# Patient Record
Sex: Male | Born: 1935
Health system: Southern US, Community
[De-identification: ages and names within clinical notes are randomized; demographics above are authoritative.]

## PROBLEM LIST (undated history)

## (undated) DIAGNOSIS — E785 Hyperlipidemia, unspecified: Secondary | ICD-10-CM

## (undated) DIAGNOSIS — T4145XA Adverse effect of unspecified anesthetic, initial encounter: Secondary | ICD-10-CM

## (undated) DIAGNOSIS — C61 Malignant neoplasm of prostate: Secondary | ICD-10-CM

## (undated) DIAGNOSIS — I10 Essential (primary) hypertension: Secondary | ICD-10-CM

## (undated) DIAGNOSIS — M419 Scoliosis, unspecified: Secondary | ICD-10-CM

## (undated) DIAGNOSIS — M1990 Unspecified osteoarthritis, unspecified site: Secondary | ICD-10-CM

## (undated) DIAGNOSIS — L209 Atopic dermatitis, unspecified: Secondary | ICD-10-CM

## (undated) DIAGNOSIS — N529 Male erectile dysfunction, unspecified: Secondary | ICD-10-CM

## (undated) DIAGNOSIS — N281 Cyst of kidney, acquired: Secondary | ICD-10-CM

## (undated) DIAGNOSIS — T8859XA Other complications of anesthesia, initial encounter: Secondary | ICD-10-CM

## (undated) DIAGNOSIS — Z923 Personal history of irradiation: Secondary | ICD-10-CM

## (undated) DIAGNOSIS — I639 Cerebral infarction, unspecified: Secondary | ICD-10-CM

## (undated) DIAGNOSIS — S82899A Other fracture of unspecified lower leg, initial encounter for closed fracture: Secondary | ICD-10-CM

## (undated) DIAGNOSIS — J410 Simple chronic bronchitis: Secondary | ICD-10-CM

## (undated) DIAGNOSIS — I714 Abdominal aortic aneurysm, without rupture, unspecified: Secondary | ICD-10-CM

## (undated) DIAGNOSIS — K4090 Unilateral inguinal hernia, without obstruction or gangrene, not specified as recurrent: Secondary | ICD-10-CM

## (undated) DIAGNOSIS — D649 Anemia, unspecified: Secondary | ICD-10-CM

## (undated) DIAGNOSIS — K5790 Diverticulosis of intestine, part unspecified, without perforation or abscess without bleeding: Secondary | ICD-10-CM

## (undated) DIAGNOSIS — Z77090 Contact with and (suspected) exposure to asbestos: Secondary | ICD-10-CM

## (undated) DIAGNOSIS — H409 Unspecified glaucoma: Secondary | ICD-10-CM

## (undated) DIAGNOSIS — I351 Nonrheumatic aortic (valve) insufficiency: Secondary | ICD-10-CM

## (undated) DIAGNOSIS — R351 Nocturia: Secondary | ICD-10-CM

## (undated) DIAGNOSIS — IMO0002 Reserved for concepts with insufficient information to code with codable children: Secondary | ICD-10-CM

## (undated) DIAGNOSIS — M169 Osteoarthritis of hip, unspecified: Secondary | ICD-10-CM

## (undated) DIAGNOSIS — Z8619 Personal history of other infectious and parasitic diseases: Secondary | ICD-10-CM

## (undated) DIAGNOSIS — E041 Nontoxic single thyroid nodule: Secondary | ICD-10-CM

## (undated) DIAGNOSIS — I7090 Unspecified atherosclerosis: Secondary | ICD-10-CM

## (undated) DIAGNOSIS — R350 Frequency of micturition: Secondary | ICD-10-CM

## (undated) HISTORY — PX: COLONOSCOPY W/ POLYPECTOMY: SHX1380

## (undated) HISTORY — DX: Essential (primary) hypertension: I10

## (undated) HISTORY — PX: CARDIAC CATHETERIZATION: SHX172

## (undated) HISTORY — DX: Male erectile dysfunction, unspecified: N52.9

## (undated) HISTORY — DX: Cerebral infarction, unspecified: I63.9

## (undated) HISTORY — DX: Malignant neoplasm of prostate: C61

## (undated) HISTORY — DX: Unspecified osteoarthritis, unspecified site: M19.90

## (undated) HISTORY — DX: Cyst of kidney, acquired: N28.1

## (undated) HISTORY — PX: OTHER SURGICAL HISTORY: SHX169

## (undated) HISTORY — DX: Scoliosis, unspecified: M41.9

## (undated) HISTORY — DX: Hyperlipidemia, unspecified: E78.5

## (undated) HISTORY — DX: Unilateral inguinal hernia, without obstruction or gangrene, not specified as recurrent: K40.90

## (undated) HISTORY — DX: Contact with and (suspected) exposure to asbestos: Z77.090

## (undated) HISTORY — PX: CATARACT EXTRACTION: SUR2

---

## 1989-01-25 HISTORY — PX: LUMBAR SPINE SURGERY: SHX701

## 1996-01-26 HISTORY — PX: UMBILICAL HERNIA REPAIR: SHX196

## 1999-06-04 ENCOUNTER — Encounter: Payer: Self-pay | Admitting: Emergency Medicine

## 1999-06-04 ENCOUNTER — Emergency Department (HOSPITAL_COMMUNITY): Admission: EM | Admit: 1999-06-04 | Discharge: 1999-06-04 | Payer: Self-pay | Admitting: Emergency Medicine

## 1999-08-28 ENCOUNTER — Ambulatory Visit: Admission: RE | Admit: 1999-08-28 | Discharge: 1999-08-28 | Payer: Self-pay

## 1999-09-10 ENCOUNTER — Ambulatory Visit (HOSPITAL_COMMUNITY): Admission: RE | Admit: 1999-09-10 | Discharge: 1999-09-10 | Payer: Self-pay

## 1999-09-10 ENCOUNTER — Encounter (INDEPENDENT_AMBULATORY_CARE_PROVIDER_SITE_OTHER): Payer: Self-pay | Admitting: *Deleted

## 1999-09-10 HISTORY — PX: OTHER SURGICAL HISTORY: SHX169

## 2000-09-07 ENCOUNTER — Other Ambulatory Visit: Admission: RE | Admit: 2000-09-07 | Discharge: 2000-09-07 | Payer: Self-pay | Admitting: Gastroenterology

## 2000-09-07 ENCOUNTER — Encounter (INDEPENDENT_AMBULATORY_CARE_PROVIDER_SITE_OTHER): Payer: Self-pay | Admitting: Specialist

## 2004-12-29 ENCOUNTER — Ambulatory Visit (HOSPITAL_COMMUNITY): Admission: RE | Admit: 2004-12-29 | Discharge: 2004-12-29 | Payer: Self-pay | Admitting: Thoracic Surgery

## 2004-12-31 ENCOUNTER — Ambulatory Visit: Payer: Self-pay | Admitting: Pulmonary Disease

## 2005-01-04 ENCOUNTER — Inpatient Hospital Stay (HOSPITAL_COMMUNITY): Admission: RE | Admit: 2005-01-04 | Discharge: 2005-01-10 | Payer: Self-pay | Admitting: Thoracic Surgery

## 2005-01-04 ENCOUNTER — Encounter (INDEPENDENT_AMBULATORY_CARE_PROVIDER_SITE_OTHER): Payer: Self-pay | Admitting: *Deleted

## 2005-01-19 ENCOUNTER — Encounter: Admission: RE | Admit: 2005-01-19 | Discharge: 2005-01-19 | Payer: Self-pay | Admitting: Thoracic Surgery

## 2005-02-09 ENCOUNTER — Encounter: Admission: RE | Admit: 2005-02-09 | Discharge: 2005-02-09 | Payer: Self-pay | Admitting: Thoracic Surgery

## 2005-04-13 ENCOUNTER — Encounter: Admission: RE | Admit: 2005-04-13 | Discharge: 2005-04-13 | Payer: Self-pay | Admitting: Thoracic Surgery

## 2005-07-14 ENCOUNTER — Encounter: Admission: RE | Admit: 2005-07-14 | Discharge: 2005-07-14 | Payer: Self-pay | Admitting: Thoracic Surgery

## 2005-07-25 ENCOUNTER — Emergency Department (HOSPITAL_COMMUNITY): Admission: EM | Admit: 2005-07-25 | Discharge: 2005-07-25 | Payer: Self-pay | Admitting: Emergency Medicine

## 2005-09-11 ENCOUNTER — Emergency Department (HOSPITAL_COMMUNITY): Admission: EM | Admit: 2005-09-11 | Discharge: 2005-09-11 | Payer: Self-pay | Admitting: Emergency Medicine

## 2009-02-24 ENCOUNTER — Ambulatory Visit (HOSPITAL_COMMUNITY): Admission: RE | Admit: 2009-02-24 | Discharge: 2009-02-24 | Payer: Self-pay | Admitting: Cardiovascular Disease

## 2010-06-12 NOTE — Op Note (Signed)
NAMEJUDAS, MOHAMMAD                ACCOUNT NO.:  [id number]   MEDICAL RECORD NO.:  [id number]          PATIENT TYPE:  INP   LOCATION:  2550                         FACILITY:  [facility]   PHYSICIAN:  Ines Bloomer, M.D. DATE OF BIRTH:  1931/10/01   DATE OF PROCEDURE:  01/04/2005  DATE OF DISCHARGE:                                 OPERATIVE REPORT   PREOPERATIVE DIAGNOSIS:  Enormous left chest cyst, possible bronchogenic.   POSTOPERATIVE DIAGNOSIS:  Enormous left chest cyst, possible bronchogenic.   OPERATION PERFORMED:  Attempted left video-assisted thoracic surgery, left  thoracotomy, resection of the enormous, probable bronchogenic cyst.   SURGEON:  Ines Bloomer, M.D.   FIRST ASSISTANTS:  1.  Evelene Croon, M.D.  2.  Zenaida Niece, R.N.F.A.   ANESTHESIA:  General anesthesia.   INDICATION:  The patient has been followed for 10 years with an elevated  diaphragm, but CT scan was done that showed this to be an enlarging,  enormous cyst -- probable bronchogenic cyst -- that was compressing the  diaphragm inferiorly, the heart to the right and atelectasis of the left  upper lobe.  She was brought to the operating room for resection.   DESCRIPTION OF PROCEDURE:  She underwent general anesthesia and was turned  to the left lateral thoracotomy position.  She was prepped and draped in the  usual sterile manner.  An attempted trocar site was made in the 6th  intercostal space at the anterior axillary line with a 5-mm trocar, but  could not insert the scope because of adhesions, so we tried again at the  4th interspace at the mid-axillary line and again could not insert the scope  because of adhesions, so a lateral thoracotomy was done with the latissimus  being divided, the serratus reflected anteriorly and the 6th intercostal  space was entered with much difficulty because of the adhesions.  With  dissecting posteriorly, we were able to find a space and freed up the  adhesions  superiorly with sharp and blunt dissection, and used a sponge  stick to break down the adhesions until the left upper lobe was freed.  The  cyst was enormous and encompassed really half the chest cavity.  It was seen  pressing on the left lower lobe right at the fissure and was more medially  than laterally, so we divided laterally, resecting up the left lower lobe of  the chest wall and then started the dissection medially off the mediastinum  and pericardium, dissecting the cyst up off the lateral anterior chest wall  and then down off the pericardium.  We then dissected it off the diaphragm  and sharp and blunt dissection.  The EZ-45 stapler was used multiple times  to help divide some of the thick adhesions; I also used electrocautery and  scissors.  The cyst was finally dissected off the diaphragm and then  serratus was resected superiorly, and was dissected off the esophagus  laterally.  Superiorly, the phrenic nerve was identified and the phrenic  nerve was stuck to the cyst and it had to be dissected off it,  but it was  dissected off and preserved.  Finally we were able to dissect the entire  cyst off the pericardium and then we dissected the fissure out, partially  dividing the fissure with the EZ-45.  The cyst was stuck to the inferior  portion of the left lower lobe and we resected it off this with the EZ-45  stapler and the Endo GIA stapler.  The cyst was removed.  Frozen section  revealed a chronic cyst, probably bronchogenic.  A 28 straight chest tube  was placed anteriorly through the 7th intercostal space and a right-angle 28  was placed in the mid-axillary line at the 7th intercostal space.  An On-Q  catheter was placed subpleurally in the usual fashion and a Marcaine block  with bupivacaine and it was done in the usual fashion.  Several tears in the  lung were sutured with 3-0 Vicryl.  The lung was reexpanded and appeared to  fill most of the space, but to be sure, we went  ahead and resected the 6th  rib subperiosteally using the Alexander periosteal elevator to do the  resection and dividing it with rib shears at the angle and then medially and  after the rib was resected, the chest was closed with 6 pericostals of #2  Vicryl with #1 Vicryl in the muscle layer, 2-0 Vicryl in the subcutaneous  tissues and 3-0 Vicryl as a subcuticular stitch.  The patient was returned  to the recovery room in stable condition.           ______________________________  Ines Bloomer, M.D.     DPB/MEDQ  D:  01/04/2005  T:  01/05/2005  Job:  [id number]   cc:   Loraine Leriche A. Perini, M.D.  Fax: [phone]

## 2010-06-12 NOTE — Op Note (Signed)
[facility]  Patient:    Derek Carr, Derek Carr                       MRN: [id number] Proc. Date: 09/10/99 Adm. Date:  [id number] Disc. Date: [id number] Attending:  Gennie Alma CC:         Hal T. Stoneking, M.D.                           Operative Report  [facility] [phone].  PREOPERATIVE DIAGNOSIS:  Right inguinal hernia.  POSTOPERATIVE DIAGNOSIS:  Indirect right inguinal hernia.  OPERATION:  Repair of right inguinal hernia with mesh.  SURGEON:  Milus Mallick, M.D.  ANESTHESIA:  Local infiltration with 1% Xylocaine with 1:200,000 parts of epinephrine, 25 cc, and monitored anesthesia care.  DESCRIPTION OF PROCEDURE:  Under adequate perioperative intravenous sedation, the patients right inguinal region was prepared and draped in the usual fashion.  Satisfactory local and field block anesthesia was instilled with the above anesthetic.  An oblique incision was made overlying the right inguinal canal and carried down through Scarpas and _____ fascia.  Bleeding was electrocoagulated.  The external oblique aponeurosis was exposed and opened in the direction of its fibers down to the external inguinal ring.  The ilioinguinal nerve was on tension, being tented by the external inguinal ring, and it was placed in a position of protection.  The right spermatic cord was dissected up and encircled with a Penrose drain.  There was a good deal of scar tissue on it because of the hernia that probably was becoming incarcerated in the external ring intermittently.  The cremaster muscle was divided over hemostasis, which were ligated with 3-0 Vicryl.  A portion of it was sent for pathologic study because of its significant reaction.  Exploration of the right spermatic cord revealed an indirect sac, which was dissected off the cord structures.  A high ligation was done with a suture ligature of 2-0 silk.  The redundant sac was trimmed off and removed from  the operative field.  The stump was allowed to retract into the preperitoneal space.  The floor of the inguinal canal was patulous, but there was no significant direct hernia component.  Repair was carried out using _____ mesh, which was cut to appropriate size, placed on the floor of the inguinal canal.  It was sewn to the inguinal ligament, pubic tubercle, and conjoined tendon, with interrupted sutures of 0 Surgilon.  The mesh was cut laterally in order to accommodate the spermatic cord.  The tails of the mesh were crossed lateral to the cord, recreating the internal ring.  The ilioinguinal nerve was placed in the canal as well.  The tails were fixed to the inguinal ligament laterally with 0 Surgilon. Hemostasis was ascertained.  The external oblique aponeurosis was closed over the cord and the mesh with a continuous suture of 3-0 Vicryl.  Scarpas fascia was closed with a continuous suture of 4-0 Vicryl.  Subcuticular layer was reapproximated with a continuous suture of 5-0 Vicryl.  Half-inch Steri-Strips were applied to the skin and sterile dressing was applied.  Estimated blood loss for the procedure was negligible.  The patient tolerated the procedure well and left the operating room in satisfactory condition. DD:  09/10/99 TD:  09/11/99 Job: [id number]

## 2010-06-12 NOTE — H&P (Signed)
Derek Carr, [place] NO.:  [id number]   MEDICAL RECORD NO.:  [id number]          PATIENT TYPE:  INP   LOCATION:  NA                           FACILITY:  [facility]   PHYSICIAN:  Ines Bloomer, M.D. DATE OF BIRTH:  09/03/1931   DATE OF ADMISSION:  DATE OF DISCHARGE:                                HISTORY & PHYSICAL   HISTORY OF PRESENT ILLNESS:  This 77 year old African American male has been  in good health, although he has a long history of smoking and has chronic  obstructive pulmonary disease.  He had worked at [employer] and many  years smoked a pack or less a day.  He was found to have an elevated left  diaphragm in 1994, that has been followed with chest x-rays which also  thought to be an elevated left diaphragm.  CT scan was obtained here and  this was not an elevated left diaphragm but an 18-cm cystic mass and a left  hemothorax displacing the heart to the right and the diaphragm inferiorly.  The mass appears to be arising from the left lower lobe.  He also has  scoliosis __________ of the hip.  His pulmonary function test showed an FVC  of 2.26 and an FEV1 of 2.52 or 82% of predicted.  His diffusion capacity was  60% of predicted.  He underwent a Cardiolite which showed a 44% ejection  fraction and no evidence of ischemia.  He is scheduled for a left VATS, left  thoracotomy, and resection of the cystic mass.   PAST MEDICAL HISTORY:  Hypertension.   MEDICATIONS:  1.  Methotrexate that he takes for a rash.  2.  Flexeril as needed.  3.  Thorazine as needed for his hiccups which he has.  4.  He has also been on Diazide 37.5 mg daily.  5.  He takes Chloroptic 0.3% eye drops.   He has no allergies.   FAMILY HISTORY:  Negative for cancer and cardiac disease.   SOCIAL HISTORY:  He smoked for many years quit just several weeks ago.  He  does not drink alcohol on a regular basis.  He is married and has four  children.   REVIEW OF SYSTEMS:  He is  165 pounds at 6 foot 2 inches.  His weight has  been stable.  CARDIAC:  No angina or atrial fibrillation.  As mentioned he  had a negative stress test.  PULMONARY:  He has had mild asthma.  He says he  gets short of breath with exertion.  He has not had hemoptysis or  bronchitis.  GI:  He has had some dysphagia and hiccups.  We got a barium  swallow on him which had to be terminated because of some aspiration.  No  history of GERD though.  GU:  No kidney disease, frequent urination, or  dysuria.  VASCULAR:  No claudication, DVT, or TIA.  NEUROLOGIC:  No  headaches, blackouts, or seizures.  ORTHOPEDIC:  He has chronic joint pain.  SKIN:  Severe dermatitis which he has taken several medications for.  PSYCHIATRIC:  No history of depression or nervousness.  HEENT:  No change in  his eyesight or hearing.  HEMATOLOGIC:  No anemia or bleeding disorders.   PHYSICAL EXAMINATION:  GENERAL:  He is a well developed African American  male in no acute distress.  VITAL SIGNS:  His blood pressure is 112/64, pulse 76, respirations 18,  saturation 96%.  HEENT:  Head is atraumatic.  Eyes:  Pupils equal, reactive to light and  accommodation.  Ears:  Tympanic membranes are intact.  Nose:  There is no  septal deviation.  Mouth without lesion.  Throat:  There is no carotid  bruits.  No supraclavicular or axillary adenopathy.  No thyromegaly.  CHEST:  Clear to auscultation percussion.  HEART:  Regular sinus rhythm.  No murmurs.  LUNGS:  There is some decreased breath sounds on the left side.  ABDOMEN:  Soft.  There is no hepatosplenomegaly.  Pulses are 2+.  EXTREMITIES:  __________ pulses are 2+.  There is no clubbing or edema.  NEUROLOGIC:  Oriented x3.  Cranial nerves II-XII are intact.  Sensory and  motor intact.  Deep tendon reflexes are 2+.  SKIN:  There is palpable rash, particularly around the elbows and joints, no  joint swelling.   IMPRESSION:  1.  Chest mass, probably benign, rule out bronchogenic  cyst, rule out      fibrous tumor.  2.  Hypertension.  3.  History of chronic obstructive pulmonary disease.  4.  History of eczema.  5.  Arthritis.   PLAN:  Left VATS resection of the lung mass, also left lower lobectomy.           ______________________________  Ines Bloomer, M.D.     DPB/MEDQ  D:  01/02/2005  T:  01/02/2005  Job:  [id number]

## 2010-06-12 NOTE — Discharge Summary (Signed)
NAMEMATTHIEU, LOFTUS                ACCOUNT NO.:  [id number]   MEDICAL RECORD NO.:  [id number]          PATIENT TYPE:  INP   LOCATION:  2006                         FACILITY:  [facility]   PHYSICIAN:  Ines Bloomer, M.D. DATE OF BIRTH:  [birth year]   DATE OF ADMISSION:  01/04/2005  DATE OF DISCHARGE:  01/10/2005                                 DISCHARGE SUMMARY   HISTORY OF PRESENT ILLNESS:  The patient is a 77 year old black male who was  referred to Dr. Edwyna Shell due to a large left chest cyst for resection.   PAST MEDICAL HISTORY:  Hypertension.   MEDICATIONS PRIOR TO ADMISSION:  1.  Methotrexate.  2.  Flexeril p.r.n.  3.  Thorazine p.r.n. for hiccups.  4.  Diazide 37.5 mg daily.  5.  Chloroptic 0.3% eye drops.  Schedule not discussed in the history and physical.   ALLERGIES:  NO KNOWN DRUG ALLERGIES.   Family history, social history, review of systems and physical examination  please see history and physical done at the time of admission.   HOSPITAL COURSE:  Patient was admitted electively, January 04, 2005, at  which time he underwent the following procedure.  Attempted left video  assisted thoracoscopic surgery, left thoracotomy, resection of large  bronchogenic cyst.  Patient tolerated the procedure well and was taken to  the post anesthesia care unit in stable condition.   POSTOPERATIVE HOSPITAL COURSE:  Patient has done well.  He has maintained  stable hemodynamics.  Laboratory values are stable postoperatively.  Most  recent hemoglobin and hematocrit dated January 08, 2005, are 10.9 and 32.7,  respectively.  BUN and creatinine are 10 and 1.0, respectively.  The patient  has been weaned from oxygen and maintained good saturations on room air. All  routine lines, monitors and drainage devices have been discontinued in a  standard fashion, although one tube was pulled out accidentally by the  patient during a time of confusion.  Following this, the patient did have a  5%  pneumothorax but otherwise chest x-ray has shown good and gradual  improvement in a manner commensurate for level of postoperative findings.  The patient's incision is healing well without signs of infection.  He is  tolerating routine activity commensurate for level of postoperative  convalescence using standard post thoracic surgical protocols.  His overall  status is felt to be adequate and stable for tentative discharge in the  morning of January 10, 2005.   PATHOLOGY:  Pathology has revealed the left lower lobe cyst to be benign  mesothelial lined cyst, 18 cm.  A resection of a segment of rib #6 showed  benign bone with marrow showing trilineage hematopoiesis and no evidence of  malignancy.  Biopsies of lymph node 9L x2 are negative for malignancy.   DISCHARGE INSTRUCTIONS:  The patient received written instructions in regard  to medications, activity, diet, wound care and follow-up.  Follow-up will  include Dr. Edwyna Shell in one week with a chest x-ray from [facility].   DISCHARGE MEDICATIONS:  As preoperatively.  Additionally, for pain, Tylox  one or two every four  to six hours as needed.   FINAL DIAGNOSIS:  Bronchogenic cyst as described.   OTHER DIAGNOSES:  As previously listed per the history.   CONDITION ON DISCHARGE:  Stable and improving.      Rowe Clack, P.A.-C.    ______________________________  Ines Bloomer, M.D.    Sherryll Burger  D:  01/09/2005  T:  01/12/2005  Job:  [id number]

## 2010-09-11 ENCOUNTER — Encounter: Payer: Self-pay | Admitting: Gastroenterology

## 2010-10-21 ENCOUNTER — Encounter: Payer: Self-pay | Admitting: Gastroenterology

## 2010-11-11 ENCOUNTER — Encounter: Payer: Self-pay | Admitting: Gastroenterology

## 2010-11-11 ENCOUNTER — Ambulatory Visit (AMBULATORY_SURGERY_CENTER): Payer: Medicare Other | Admitting: *Deleted

## 2010-11-11 VITALS — Ht 75.0 in | Wt 164.9 lb

## 2010-11-11 DIAGNOSIS — Z1211 Encounter for screening for malignant neoplasm of colon: Secondary | ICD-10-CM

## 2010-11-11 MED ORDER — MOVIPREP 100 G PO SOLR: ORAL | Status: DC

## 2010-11-24 ENCOUNTER — Encounter: Payer: Self-pay | Admitting: Gastroenterology

## 2010-11-24 ENCOUNTER — Other Ambulatory Visit: Payer: Self-pay | Admitting: Gastroenterology

## 2010-11-24 ENCOUNTER — Ambulatory Visit (AMBULATORY_SURGERY_CENTER): Payer: Medicare Other | Admitting: Gastroenterology

## 2010-11-24 VITALS — BP 122/74 | HR 54 | Temp 95.5°F | Resp 13 | Ht 75.0 in | Wt 164.0 lb

## 2010-11-24 DIAGNOSIS — Z1211 Encounter for screening for malignant neoplasm of colon: Secondary | ICD-10-CM

## 2010-11-24 MED ORDER — SODIUM CHLORIDE 0.9 % IV SOLN: 500.0000 mL | INTRAVENOUS | Status: DC

## 2010-11-24 NOTE — Patient Instructions (Signed)
Read the handouts given to you by your recovery room nurse.   Increase the iber in your diet.  You may resume your routine medications today.  If you have any questions, please call us at [phone].

## 2010-11-25 ENCOUNTER — Telehealth: Payer: Self-pay

## 2010-11-25 NOTE — Telephone Encounter (Signed)

## 2011-05-11 DIAGNOSIS — C61 Malignant neoplasm of prostate: Secondary | ICD-10-CM | POA: Insufficient documentation

## 2011-05-11 HISTORY — DX: Malignant neoplasm of prostate: C61

## 2011-05-11 HISTORY — PX: PROSTATE BIOPSY: SHX241

## 2011-05-18 ENCOUNTER — Other Ambulatory Visit (HOSPITAL_COMMUNITY): Payer: Self-pay | Admitting: Urology

## 2011-05-18 DIAGNOSIS — C61 Malignant neoplasm of prostate: Secondary | ICD-10-CM

## 2011-05-26 ENCOUNTER — Ambulatory Visit (HOSPITAL_COMMUNITY)
Admission: RE | Admit: 2011-05-26 | Discharge: 2011-05-26 | Disposition: A | Discharge status: Home / Self Care | Payer: Medicare Other | Source: Ambulatory Visit | Attending: Urology | Admitting: Urology

## 2011-05-26 ENCOUNTER — Encounter (HOSPITAL_COMMUNITY): Payer: Self-pay

## 2011-05-26 DIAGNOSIS — M4120 Other idiopathic scoliosis, site unspecified: Secondary | ICD-10-CM | POA: Insufficient documentation

## 2011-05-26 DIAGNOSIS — M169 Osteoarthritis of hip, unspecified: Secondary | ICD-10-CM | POA: Insufficient documentation

## 2011-05-26 DIAGNOSIS — C61 Malignant neoplasm of prostate: Secondary | ICD-10-CM | POA: Insufficient documentation

## 2011-05-26 DIAGNOSIS — R972 Elevated prostate specific antigen [PSA]: Secondary | ICD-10-CM | POA: Insufficient documentation

## 2011-05-26 DIAGNOSIS — M1610 Unilateral primary osteoarthritis, unspecified hip: Secondary | ICD-10-CM | POA: Insufficient documentation

## 2011-05-26 MED ORDER — TECHNETIUM TC 99M MEDRONATE IV KIT
25.1000 | PACK | Freq: Once | INTRAVENOUS | Status: AC | PRN
  Administered 2011-05-26: 25.1 via INTRAVENOUS

## 2011-05-28 ENCOUNTER — Encounter: Payer: Self-pay | Admitting: *Deleted

## 2011-05-28 ENCOUNTER — Encounter: Payer: Self-pay | Admitting: Radiation Oncology

## 2011-05-31 ENCOUNTER — Ambulatory Visit: Payer: Medicare Other

## 2011-05-31 ENCOUNTER — Ambulatory Visit: Payer: Medicare Other | Admitting: Radiation Oncology

## 2011-06-07 ENCOUNTER — Ambulatory Visit
Admission: RE | Admit: 2011-06-07 | Discharge: 2011-06-07 | Disposition: A | Discharge status: Home / Self Care | Payer: Medicare Other | Source: Ambulatory Visit | Attending: Radiation Oncology | Admitting: Radiation Oncology

## 2011-06-07 ENCOUNTER — Encounter: Payer: Self-pay | Admitting: *Deleted

## 2011-06-07 ENCOUNTER — Encounter: Payer: Self-pay | Admitting: Radiation Oncology

## 2011-06-07 VITALS — BP 161/99 | HR 62 | Temp 97.0°F | Wt 177.1 lb

## 2011-06-07 DIAGNOSIS — Z87891 Personal history of nicotine dependence: Secondary | ICD-10-CM | POA: Insufficient documentation

## 2011-06-07 DIAGNOSIS — Z79899 Other long term (current) drug therapy: Secondary | ICD-10-CM | POA: Insufficient documentation

## 2011-06-07 DIAGNOSIS — C61 Malignant neoplasm of prostate: Secondary | ICD-10-CM | POA: Insufficient documentation

## 2011-06-07 DIAGNOSIS — E785 Hyperlipidemia, unspecified: Secondary | ICD-10-CM | POA: Insufficient documentation

## 2011-06-07 DIAGNOSIS — I10 Essential (primary) hypertension: Secondary | ICD-10-CM | POA: Insufficient documentation

## 2011-06-07 DIAGNOSIS — Z77090 Contact with and (suspected) exposure to asbestos: Secondary | ICD-10-CM | POA: Insufficient documentation

## 2011-06-07 NOTE — Progress Notes (Signed)
Here with wife and daughter Banker at [facility]) for radiation consultation of prostate.Has not decided on mode of treatment but has been told surgery not first mode of treatment per urologist by daughter.Gleason 4+4=8.Prostate volume 45.7 cc.

## 2011-06-07 NOTE — Progress Notes (Signed)
Please see the Nurse Progress Note in the MD Initial Consult Encounter for this patient. 

## 2011-06-08 ENCOUNTER — Encounter: Payer: Self-pay | Admitting: Radiation Oncology

## 2011-06-08 NOTE — Progress Notes (Signed)
Radiation Oncology         [phone] ________________________________  Initial outpatient Consultation  Name: Derek Carr MRN: [id number]  Date: 06/07/2011  DOB: 11-01-1931  WU:JWJXBJ,YNWG A, MD, MD  Crecencio Mc, MD   REFERRING PHYSICIAN: Crecencio Mc, MD  DIAGNOSIS: 79 year old gentleman with stage T3a adenocarcinoma of the prostate with Gleason score 4+4 and PSA of 10.3  HISTORY OF PRESENT ILLNESS::Derek Carr is a 78 y.o. male who was noted to have an elevated PSA in the past. His PSA was 8.9 in January 2013 with his primary care physician. He was consequently referred to Dr. Heloise Purpura for evaluation on 04/21/2011.  Digital rectal exam at that time revealed a prostate with a firm area of nodularity along the right lateral and right lateral apical areas worrisome for possible extraprostatic extension. The patient proceeded with transrectal ultrasound and 12 biopsies of the prostate on April 16. 7/12 core biopsies were positive with a maximum Gleason score 4+4 seen in 50% of the left lateral base, 20% of the left base, 50% left lateral mid, 80% of the left lateral apex and 90% of the left apex. Adenocarcinoma with a Gleason score 3+4 was seen in 10% of the right lateral apex and 20% of the left mid. The patient review the biopsy results with urology. His repeat PSA remained elevated at 10.3. He's, been referred today for discussion of potential radiation treatment options.  PREVIOUS RADIATION THERAPY: No  PAST MEDICAL HISTORY:  has a past medical history of Shingles; Hyperlipidemia; Arthritis; Hypertension; Erectile dysfunction; Shingles (herpes zoster) polyneuropathy; Prostate cancer (05/11/2011); Eczema (?); and Asbestos exposure.    PAST SURGICAL HISTORY: Past Surgical History  Procedure Date  . Thoracotomy     had since birth-/vats thoracotomy resection of cystic mass  . Colonoscopy   . Esophageus cyst removal   . Umbilical hernia repair 1998    epigastric  . Back  surgery 1991  . Inguinal hernia repair 2001    right  . Prostate biopsy 05/11/11    Adenocarcinoma    FAMILY HISTORY: family history includes Hypertension in his mother; Lung cancer in his brother; and Prostate cancer in his brother.  SOCIAL HISTORY:  reports that he quit smoking about 2 weeks ago. His smoking use included Cigarettes. He has a 25 pack-year smoking history. He has never used smokeless tobacco. He reports that he drinks alcohol. He reports that he does not use illicit drugs.  ALLERGIES: Review of patient's allergies indicates no known allergies.  MEDICATIONS:  Current Outpatient Prescriptions  Medication Sig Dispense Refill  . amlodipine-benazepril (LOTREL) 2.5-10 MG per capsule Take 1 capsule by mouth daily.      Marland Kitchen aspirin 81 MG tablet Take 81 mg by mouth daily.      . cholecalciferol (VITAMIN D) 1000 UNITS tablet Take 1,000 Units by mouth daily.      . hydrOXYzine (ATARAX/VISTARIL) 10 MG tablet Take 10 mg by mouth 3 (three) times daily as needed.      . moxifloxacin (VIGAMOX) 0.5 % ophthalmic solution 1 drop 3 (three) times daily.      . pravastatin (PRAVACHOL) 80 MG tablet       . vitamin E (VITAMIN E) 400 UNIT capsule Take 400 Units by mouth daily.      . cyclopentolate (CYCLODRYL,CYCLOGYL) 2 % ophthalmic solution 2 drops once.      . Ganciclovir (ZIRGAN) 0.15 % GEL Apply to eye.      . tacrolimus (PROTOPIC) 0.1 % ointment Apply  topically 2 (two) times daily.        REVIEW OF SYSTEMS:  A 15 point review of systems is documented in the electronic medical record. This was obtained by the nursing staff. However, I reviewed this with the patient to discuss relevant findings and make appropriate changes.  The patient did show a urinary symptom questionnaire provides an overall score of 13/35 indicating moderate outflow obstructive symptoms. He awakens 5 times nightly with nocturia. He also filled out an erectile function questionnaire indicating a significant degree of  impotence.   PHYSICAL EXAM:  weight is 177 lb 1.6 oz (80.332 kg). His temperature is 97 F (36.1 C). His blood pressure is 161/99 and his pulse is 62.   No exam performed today, In the interest of patient counseling. Please note the digital rectal exam findings described above by urology.Marland Kitchen  LABORATORY DATA:  No results found for this basename: WBC, HGB, HCT, MCV, PLT   No results found for this basename: NA, K, CL, CO2   No results found for this basename: ALT, AST, GGT, ALKPHOS, BILITOT     RADIOGRAPHY: Nm Bone Scan Whole Body  05/26/2011  *RADIOLOGY REPORT*  Clinical Data: Prostate cancer with elevated serum PSA level (10.3 on 04/27/2011).  No acute pain.  History of back surgery.  NUCLEAR MEDICINE WHOLE BODY BONE SCINTIGRAPHY  Technique:  Whole body anterior and posterior images were obtained approximately 3 hours after intravenous injection of radiopharmaceutical.  Radiopharmaceutical: 25.1MILLI CURIE TC-MDP TECHNETIUM TC 75M MEDRONATE IV KIT  Comparison: Pelvic CT today.  Abdominal pelvic CT 11/28/2008.  Findings: There is no activity favoring osseous metastatic disease. Asymmetric activity at the right hip corresponds with advanced right hip osteoarthritis.  There is an upward left pelvic tilt associated with a convex right lumbar scoliosis.  There is degenerative activity in the lower lumbar spine facet joints.  There is probable mild facet activity in the left aspect of the upper cervical spine. Minimal activity near the sternomanubrial junction is likely degenerative.  There is a small focus of slightly increased activity along the lateral aspect of the right sixth rib, likely incidental.  The soft tissue activity is normal.  IMPRESSION:  1.  No evidence of osseous metastatic disease. 2.  Scattered arthropathic activity as described.  Original Report Authenticated By: Gerrianne Scale, M.D.      IMPRESSION: This patient is a very nice 78 year old gentleman with stage TIIIa adenocarcinoma  prostate with Gleason score 4+4 and PSA of 10.3. He falls into the high risk group based on his Gleason score and would be eligible for a variety of potential treatment options. His options include radiation therapy with or without seed boost in conjunction with androgen deprivation treatment.  PLAN: Today, I talked with the patient and his family about the findings and workup thus far. We talked about the natural history of prostate cancer and the potential treatment options available to this particular patient. We talked about the implications of  t-stage, Gleason score, and PSA on treatment decisions as well as outcomes. Patient understands that he falls into the high risk category based on his Gleason score. We talked about how his grade of cancer constitutes high risk disease and what this portends about potential and capsular extension and as well as disease recurrence. About the rationale for external beam radiation treatment as well as the rationale for androgen depravation. About the rationale for 8 weeks IMRT versus 5 weeks of external radiation with prostate seed boost. Ultimately, conversation was  guided by questions and concerns raised by the patient's daughter on his path. She was concerned about the lack of surgical staging associated with radiation therapy and felt that her father's otherwise excellent health should be taken into consideration as we consider surgery versus radiation rather than simply looking at his advanced alone. I tried to reassure the patient's daughter that advanced age alone does not govern treatment modality. I explained that this is a decision which factors in a variety of issues. Other reasons to consider radiation rather than surgery included the very high likelihood of finding disease outside of the prostate at the time of surgery warranting postoperative radiotherapy. Towards the end of our discussion, the patient seems to understand the reasoning behind the decision  between surgery and radiation therapy. They gather information today about radiation treatment to include in their decision making process. I think the patient would be well-suited for androgen depravation with radiotherapy and encouraged them to contact me in the future if they have additional questions or concerns as they reach a final decision about treatment.  This was a very protracted office consultation. I spent 80 minutes minutes face to face with the patient and more than 50% of that time was spent in counseling and/or coordination of care.   ------------------------------------------------  [name]. Kathrynn Running, M.D.

## 2011-06-09 NOTE — Progress Notes (Signed)
Encounter addended by: Erynn Vaca Marie Akio Hudnall, RN on: 06/09/2011  4:27 PM<BR>     Documentation filed: Charges VN

## 2011-06-10 NOTE — Progress Notes (Signed)
Encounter addended by: Tessa Lerner, RN on: 06/10/2011 10:43 AM<BR>     Documentation filed: Medications

## 2011-06-11 NOTE — Progress Notes (Signed)
Encounter addended by: Rinaldo Cloud on: 06/11/2011  4:14 PM<BR>     Documentation filed: Charges VN

## 2011-06-11 NOTE — Progress Notes (Signed)
Encounter addended by: Agnes Lawrence, RN on: 06/11/2011  4:16 PM<BR>     Documentation filed: Charges VN

## 2011-06-30 ENCOUNTER — Encounter: Payer: Self-pay | Admitting: *Deleted

## 2011-07-01 ENCOUNTER — Encounter: Payer: Self-pay | Admitting: Radiation Oncology

## 2011-07-01 ENCOUNTER — Ambulatory Visit
Admission: RE | Admit: 2011-07-01 | Discharge: 2011-07-01 | Disposition: A | Discharge status: Home / Self Care | Payer: Medicare Other | Source: Ambulatory Visit | Attending: Radiation Oncology | Admitting: Radiation Oncology

## 2011-07-01 ENCOUNTER — Ambulatory Visit: Payer: Medicare Other | Admitting: Radiation Oncology

## 2011-07-01 VITALS — BP 135/87 | HR 67 | Temp 98.4°F | Wt 176.0 lb

## 2011-07-01 DIAGNOSIS — C61 Malignant neoplasm of prostate: Secondary | ICD-10-CM

## 2011-07-01 NOTE — Progress Notes (Signed)
Radiation Oncology         [phone] ________________________________  Name: Derek Carr MRN: [id number]  Date: 07/01/2011  DOB: [birth year]  Follow-Up Visit Note  CC: Ezequiel Kayser, MD, MD  Crecencio Mc, MD  Diagnosis:   78 year old gentleman with stage T3a adenocarcinoma of the prostate with Gleason score 4+4 and PSA of 10.3  Narrative:  The patient returns today to discuss his status and review remaining questions as he decides on possible treatment.  The patient has met with Dr. Laverle Patter, Dr. Earlene Plater, and Dr. Margo Aye to discuss possible surgery. He's also met with Dr. Carolin Sicks regarding possible high-dose rate brachytherapy. At this point, the patient has essentially ruled out radical prostatectomy. He has now narrowed his decision to androgen deprivation with external radiation followed by some form of brachytherapy.  He and his daughter present today with additional questions about forms of brachytherapy as well as the potential for lymph node irradiation in the pelvis versus radiotherapy to the prostate only.  ALLERGIES:   has no known allergies.  Meds: Current Outpatient Prescriptions  Medication Sig Dispense Refill  . amlodipine-benazepril (LOTREL) 2.5-10 MG per capsule Take 1 capsule by mouth daily.      Marland Kitchen aspirin 81 MG tablet Take 81 mg by mouth daily.      . cholecalciferol (VITAMIN D) 1000 UNITS tablet Take 1,000 Units by mouth daily.      . hydrOXYzine (ATARAX/VISTARIL) 10 MG tablet Take 10 mg by mouth 3 (three) times daily as needed.      . moxifloxacin (VIGAMOX) 0.5 % ophthalmic solution 1 drop 3 (three) times daily.      . pravastatin (PRAVACHOL) 80 MG tablet       . vitamin E (VITAMIN E) 400 UNIT capsule Take 400 Units by mouth daily.        Physical Findings: The patient is in no acute distress. Patient is alert and oriented. Filed Vitals:   07/01/11 1203  BP: 135/87  Pulse: 67  Temp: 98.4 F (36.9 C)  No significant changes.  Impression:  The patient is still  deciding how best to proceed with treatment for his prostate cancer.  He and his daughter have many questions about optimal forms radiation and frankly they have narrowed their questions down to dilemmas that are debated amongst prostate cancer experts without clear answers. They wanted to know whether HDR brachytherapy or permanent seeds were better for boost.  They also want to know whether pelvic lymph node irradiation was necessary and worth the potential risks for small bowel exposure.  Essentially, the patient and his daughter has become very well educated about the details related to radiation treatment.  Plan:  Today, I spent 40 minutes in face-to-face counseling and coordination of care. I reviewed their questions as best I could. I spent time helping to dissect the questions described above providing the rationale for arguments for and against HDR versus permanent seeds as well as the salient arguments for and against pelvic lymph node irradiation. I conveyed that these questions simply don't have clear answers.  I do think the patient is appropriate for androgen deprivation for 2-3 years in conjunction with external beam radiation with or without brachytherapy boost.  I recommended that they consider initiating androgen deprivation now as they continue to decide on radiation permutations since the androgen deprivation would provide a few months of decision time for any additional information gathering.  I encouraged the patient and his daughter to call or return if they have  additional questions or concerns. I will look forward to follow along on their progress. _____________________________________  [name] Kathrynn Running, M.D.

## 2011-07-01 NOTE — Progress Notes (Signed)
Please see the Nurse Progress Note in the MD Initial Consult Encounter for this patient. 

## 2011-07-02 ENCOUNTER — Encounter: Payer: Self-pay | Admitting: Cardiovascular Disease

## 2011-07-19 NOTE — Addendum Note (Signed)
Encounter addended by: Agnes Lawrence, RN on: 07/19/2011  4:51 PM<BR>     Documentation filed: Charges VN

## 2011-08-23 ENCOUNTER — Telehealth: Payer: Self-pay | Admitting: *Deleted

## 2011-08-23 NOTE — Telephone Encounter (Signed)
CALLED PATIENT TO INFORM OF GOLD SEED PLACEMENT  FOR 08-26-11 AND HIS SIM ON 09-10-11 AT 10:00 AM -AT DR. MANNING'S OFFICE, SPOKE WITH PATIENT'S DAUGHTER AND THEY ARE AWARE OF THESE APPTS.

## 2011-09-10 ENCOUNTER — Encounter: Payer: Self-pay | Admitting: Radiation Oncology

## 2011-09-10 ENCOUNTER — Ambulatory Visit
Admission: RE | Admit: 2011-09-10 | Discharge: 2011-09-10 | Disposition: A | Discharge status: Home / Self Care | Payer: Medicare Other | Source: Ambulatory Visit | Attending: Radiation Oncology | Admitting: Radiation Oncology

## 2011-09-10 DIAGNOSIS — C61 Malignant neoplasm of prostate: Secondary | ICD-10-CM

## 2011-09-10 NOTE — Progress Notes (Signed)
Met with patient to discuss RO billing.  Dx: 185 malignant neoplasm of prostate  Attending Rad: Dr. Landis Gandy Tx: [id number] Extrl Beam

## 2011-09-10 NOTE — Progress Notes (Addendum)
  Radiation Oncology         [phone] ________________________________  Name: Derek Carr MRN: [id number]  Date: 09/10/2011  DOB: 10-02-1931  SIMULATION AND TREATMENT PLANNING NOTE  DIAGNOSIS:  78 year old gentleman with stage T3a adenocarcinoma of the prostate with Gleason score 4+4 and PSA of 10.3  NARRATIVE:  The patient was brought to the CT Simulation planning suite.  Identity was confirmed.  All relevant records and images related to the planned course of therapy were reviewed.  The patient freely provided informed written consent to proceed with treatment after reviewing the details related to the planned course of therapy. The consent form was witnessed and verified by the simulation staff.  Then, the patient was set-up in a stable reproducible  supine position for radiation therapy.  CT images were obtained.  Surface markings were placed.  The CT images were loaded into the planning software.  Then the target and avoidance structures were contoured.  Treatment planning then occurred.  The radiation prescription was entered and confirmed.  A total of 5 complex treatment devices were fabricated in the form of an alpha cradle and four MLC apertures to shape radiation around the prostate and seminal vesicles from anterior, posterior, and bilateral beam angles. I have requested : 3D Planning with DVHs of the prostate, rectum, bladder, small bowel, and hips. Have requested daily image guidance with cone beam CT a lining to the patient's gold fiducial markers within the prostate.   Following completion of external radiation, the patient will undergo prostate seed implant boost. Accordingly, we performed a pubic arch study today as well in view to the prostate volume from 8 transperineal needle pathway. No pubic arch interference was noted. The prostate volume was confirmed to be 44 cc on CT. Based on these findings, the patient is felt to be eligible for prostate seed implant boost.    PLAN:   The patient will receive 45 Gy in 25 fractions followed by prostate seed implant to deliver an additional 110 Gy using Iodine-125 seeds.  ________________________________  [name] Kathrynn Running, M.D.

## 2011-09-10 NOTE — Addendum Note (Signed)
Encounter addended by: Oneita Hurt, MD on: 09/10/2011  5:00 PM<BR>     Documentation filed: Inpatient Notes, Notes Section

## 2011-09-13 ENCOUNTER — Encounter: Payer: Self-pay | Admitting: Radiation Oncology

## 2011-09-13 ENCOUNTER — Ambulatory Visit
Admission: RE | Admit: 2011-09-13 | Discharge: 2011-09-13 | Disposition: A | Discharge status: Home / Self Care | Payer: Medicare Other | Source: Ambulatory Visit | Attending: Radiation Oncology | Admitting: Radiation Oncology

## 2011-09-13 DIAGNOSIS — C61 Malignant neoplasm of prostate: Secondary | ICD-10-CM

## 2011-09-13 NOTE — Progress Notes (Signed)
  Radiation Oncology         [phone] ________________________________  Name: Derek Carr MRN: [id number]  Date: 09/13/2011  DOB: [birth year]  Simulation Verification Note  Status: outpatient  NARRATIVE: The patient was brought to the treatment unit and placed in the planned treatment position. The clinical setup was verified. Then port films were obtained and uploaded to the radiation oncology medical record software.  The treatment beams were carefully compared against the planned radiation fields. The position location and shape of the radiation fields was reviewed. They targeted volume of tissue appears to be appropriately covered by the radiation beams. Organs at risk appear to be excluded as planned.  Based on my personal review, I approved the simulation verification. The patient's treatment will proceed as planned.  ------------------------------------------------  [name] Kathrynn Running, M.D.

## 2011-09-14 ENCOUNTER — Encounter: Payer: Self-pay | Admitting: *Deleted

## 2011-09-14 ENCOUNTER — Ambulatory Visit
Admission: RE | Admit: 2011-09-14 | Payer: Medicare Other | Source: Ambulatory Visit | Attending: Radiation Oncology | Admitting: Radiation Oncology

## 2011-09-14 ENCOUNTER — Ambulatory Visit
Admission: RE | Admit: 2011-09-14 | Discharge: 2011-09-14 | Disposition: A | Discharge status: Home / Self Care | Payer: Medicare Other | Source: Ambulatory Visit | Attending: Radiation Oncology | Admitting: Radiation Oncology

## 2011-09-15 ENCOUNTER — Ambulatory Visit
Admission: RE | Admit: 2011-09-15 | Discharge: 2011-09-15 | Disposition: A | Discharge status: Home / Self Care | Payer: Medicare Other | Source: Ambulatory Visit | Attending: Radiation Oncology | Admitting: Radiation Oncology

## 2011-09-16 ENCOUNTER — Ambulatory Visit
Admission: RE | Admit: 2011-09-16 | Discharge: 2011-09-16 | Disposition: A | Discharge status: Home / Self Care | Payer: Medicare Other | Source: Ambulatory Visit | Attending: Radiation Oncology | Admitting: Radiation Oncology

## 2011-09-16 DIAGNOSIS — C61 Malignant neoplasm of prostate: Secondary | ICD-10-CM

## 2011-09-16 NOTE — Progress Notes (Signed)
Routine of clinic reviewed with patient.Understands he will be seen by Dr.Manning every Friday after treatment unless changes in schedule.Given Radiation and You Booklet and instructed on focusing on side effects related to pelvic region including urinary symptoms, bowels, perineum care and fatigue.Daughter or other family member will accompany him on tomorrow.Will reinforce teaching weekly.So far patient is doing very well.

## 2011-09-17 ENCOUNTER — Ambulatory Visit
Admission: RE | Admit: 2011-09-17 | Discharge: 2011-09-17 | Disposition: A | Discharge status: Home / Self Care | Payer: Medicare Other | Source: Ambulatory Visit | Attending: Radiation Oncology | Admitting: Radiation Oncology

## 2011-09-17 ENCOUNTER — Encounter: Payer: Self-pay | Admitting: Radiation Oncology

## 2011-09-17 VITALS — BP 139/89 | HR 71 | Temp 97.6°F | Wt 175.9 lb

## 2011-09-17 DIAGNOSIS — C61 Malignant neoplasm of prostate: Secondary | ICD-10-CM

## 2011-09-17 NOTE — Progress Notes (Signed)
Here with family for weekly under treat of prostate cancer.Has completed 5 of 25 treatments. Has some  hot flashes. Takes lupron injection every 4 months. Next due in October.

## 2011-09-17 NOTE — Progress Notes (Signed)
  Radiation Oncology         [phone] ________________________________  Name: Derek Carr MRN: [id number]  Date: 09/17/2011  DOB: January 23, 1932  Weekly Radiation Therapy Management  Current Dose: 9 Gy     Planned Dose:  45 Gy followed by seed implant boost  Narrative . . . . . . . . The patient presents for routine under treatment assessment.                                                The patient is without complaint.                                 Set-up films were reviewed.                                 The chart was checked. Physical Findings. . . Weight essentially stable.  No significant changes. Impression . . . . . . . The patient is  tolerating radiation. Plan . . . . . . . . . . . . Continue treatment as planned.  ________________________________  [name]. Kathrynn Running, M.D.

## 2011-09-20 ENCOUNTER — Ambulatory Visit
Admission: RE | Admit: 2011-09-20 | Discharge: 2011-09-20 | Disposition: A | Discharge status: Home / Self Care | Payer: Medicare Other | Source: Ambulatory Visit | Attending: Radiation Oncology | Admitting: Radiation Oncology

## 2011-09-21 ENCOUNTER — Other Ambulatory Visit: Payer: Self-pay | Admitting: Urology

## 2011-09-21 ENCOUNTER — Ambulatory Visit
Admission: RE | Admit: 2011-09-21 | Discharge: 2011-09-21 | Disposition: A | Discharge status: Home / Self Care | Payer: Medicare Other | Source: Ambulatory Visit | Attending: Radiation Oncology | Admitting: Radiation Oncology

## 2011-09-22 ENCOUNTER — Ambulatory Visit
Admission: RE | Admit: 2011-09-22 | Discharge: 2011-09-22 | Disposition: A | Discharge status: Home / Self Care | Payer: Medicare Other | Source: Ambulatory Visit | Attending: Radiation Oncology | Admitting: Radiation Oncology

## 2011-09-23 ENCOUNTER — Ambulatory Visit
Admission: RE | Admit: 2011-09-23 | Discharge: 2011-09-23 | Disposition: A | Discharge status: Home / Self Care | Payer: Medicare Other | Source: Ambulatory Visit | Attending: Radiation Oncology | Admitting: Radiation Oncology

## 2011-09-24 ENCOUNTER — Encounter: Payer: Self-pay | Admitting: Radiation Oncology

## 2011-09-24 ENCOUNTER — Ambulatory Visit
Admission: RE | Admit: 2011-09-24 | Discharge: 2011-09-24 | Disposition: A | Discharge status: Home / Self Care | Payer: Medicare Other | Source: Ambulatory Visit | Attending: Radiation Oncology | Admitting: Radiation Oncology

## 2011-09-24 VITALS — BP 145/86 | HR 71 | Temp 97.7°F | Wt 177.3 lb

## 2011-09-24 DIAGNOSIS — C61 Malignant neoplasm of prostate: Secondary | ICD-10-CM

## 2011-09-24 NOTE — Progress Notes (Signed)
Patient here for weekly under treat assessment of prostate radiation treatment.Patient has completed 10 of 25 treatments.Bowels are soft in consistency up to 2/day. Has frequency and urgency of urination but no burning.Nocturia up to 5 times.No fatigue.

## 2011-09-24 NOTE — Assessment & Plan Note (Signed)
Current Dose: 18 Gy     Planned Dose:  45 Gy plus boost

## 2011-09-24 NOTE — Progress Notes (Signed)
  Radiation Oncology         [phone] ________________________________  Name: Derek Carr MRN: [id number]  Date: 09/24/2011  DOB: August 18, 1931  Weekly Radiation Therapy Management  Prostate cancer Current Dose: 18 Gy     Planned Dose:  45 Gy plus boost   Narrative . . . . . . . . The patient presents for routine under treatment assessment.           Patient here for weekly under treat assessment of prostate radiation treatment.Patient has completed 10 of 25 treatments.Bowels are soft in consistency up to 2/day.  Has frequency and urgency of urination but no burning.Nocturia up to 5 times.No fatigue                                   The patient is without complaint.                                 Set-up films were reviewed.                                 The chart was checked. Physical Findings. . . Weight essentially stable.  No significant changes. Impression . . . . . . . The patient is  tolerating radiation. Plan . . . . . . . . . . . . Continue treatment as planned.   Seed implant Oct 21st.  ________________________________  [name]. Kathrynn Running, M.D.

## 2011-09-28 ENCOUNTER — Ambulatory Visit
Admission: RE | Admit: 2011-09-28 | Discharge: 2011-09-28 | Disposition: A | Discharge status: Home / Self Care | Payer: Medicare Other | Source: Ambulatory Visit | Attending: Radiation Oncology | Admitting: Radiation Oncology

## 2011-09-29 ENCOUNTER — Ambulatory Visit
Admission: RE | Admit: 2011-09-29 | Discharge: 2011-09-29 | Disposition: A | Discharge status: Home / Self Care | Payer: Medicare Other | Source: Ambulatory Visit | Attending: Radiation Oncology | Admitting: Radiation Oncology

## 2011-09-30 ENCOUNTER — Encounter: Payer: Self-pay | Admitting: Radiation Oncology

## 2011-09-30 ENCOUNTER — Ambulatory Visit
Admission: RE | Admit: 2011-09-30 | Discharge: 2011-09-30 | Disposition: A | Discharge status: Home / Self Care | Payer: Medicare Other | Source: Ambulatory Visit | Attending: Radiation Oncology | Admitting: Radiation Oncology

## 2011-09-30 VITALS — BP 147/96 | HR 80 | Temp 97.7°F | Resp 20 | Wt 177.4 lb

## 2011-09-30 DIAGNOSIS — C61 Malignant neoplasm of prostate: Secondary | ICD-10-CM

## 2011-09-30 NOTE — Progress Notes (Signed)
Pt reports nocturia x 3-4 but states this is his baseline from prior to beginning radiation. Pt denies other urinary or bowel issues, denies pain, fatigue, loss of appetite.

## 2011-09-30 NOTE — Progress Notes (Signed)
  Radiation Oncology         [phone] ________________________________  Name: Derek Carr MRN: [id number]  Date: 09/30/2011  DOB: [birth year]  Weekly Radiation Therapy Management  Current Dose: 23.4 Gy     Planned Dose:  45 Gy plus seed boost  Narrative . . . . . . . . The patient presents for routine under treatment assessment.                                          The patient is without complaint.                                 Set-up films were reviewed.                                 The chart was checked. Physical Findings. . . Weight essentially stable.  No significant changes. Impression . . . . . . . The patient is  tolerating radiation. Plan . . . . . . . . . . . . Continue treatment as planned.  ________________________________  [name]. Kathrynn Running, M.D.

## 2011-10-01 ENCOUNTER — Ambulatory Visit
Admission: RE | Admit: 2011-10-01 | Discharge: 2011-10-01 | Disposition: A | Discharge status: Home / Self Care | Payer: Medicare Other | Source: Ambulatory Visit | Attending: Radiation Oncology | Admitting: Radiation Oncology

## 2011-10-04 ENCOUNTER — Ambulatory Visit
Admission: RE | Admit: 2011-10-04 | Discharge: 2011-10-04 | Disposition: A | Discharge status: Home / Self Care | Payer: Medicare Other | Source: Ambulatory Visit | Attending: Radiation Oncology | Admitting: Radiation Oncology

## 2011-10-05 ENCOUNTER — Ambulatory Visit
Admission: RE | Admit: 2011-10-05 | Discharge: 2011-10-05 | Disposition: A | Discharge status: Home / Self Care | Payer: Medicare Other | Source: Ambulatory Visit | Attending: Radiation Oncology | Admitting: Radiation Oncology

## 2011-10-06 ENCOUNTER — Ambulatory Visit
Admission: RE | Admit: 2011-10-06 | Discharge: 2011-10-06 | Disposition: A | Discharge status: Home / Self Care | Payer: Medicare Other | Source: Ambulatory Visit | Attending: Radiation Oncology | Admitting: Radiation Oncology

## 2011-10-07 ENCOUNTER — Ambulatory Visit
Admission: RE | Admit: 2011-10-07 | Discharge: 2011-10-07 | Disposition: A | Discharge status: Home / Self Care | Payer: Medicare Other | Source: Ambulatory Visit | Attending: Radiation Oncology | Admitting: Radiation Oncology

## 2011-10-08 ENCOUNTER — Encounter: Payer: Self-pay | Admitting: Radiation Oncology

## 2011-10-08 ENCOUNTER — Ambulatory Visit
Admission: RE | Admit: 2011-10-08 | Discharge: 2011-10-08 | Disposition: A | Discharge status: Home / Self Care | Payer: Medicare Other | Source: Ambulatory Visit | Attending: Radiation Oncology | Admitting: Radiation Oncology

## 2011-10-08 VITALS — BP 122/70 | HR 76 | Temp 98.4°F | Resp 20 | Wt 177.1 lb

## 2011-10-08 DIAGNOSIS — C61 Malignant neoplasm of prostate: Secondary | ICD-10-CM

## 2011-10-08 NOTE — Progress Notes (Signed)
  Radiation Oncology         [phone] ________________________________  Name: Derek Carr MRN: [id number]  Date: 10/08/2011  DOB: Aug 05, 1931  Weekly Radiation Therapy Management 78 year old gentleman with stage T3 adenocarcinoma prostate with Gleason score 4+4 and PSA of 10.3 Currently receiving external radiation was planned prostate seed implant boost   Current Dose: 34.2 Gy     Planned Dose:  45 Gy  Plus seeds  Narrative . . . . . . . . The patient presents for routine under treatment assessment.                                                  Pt reports urinary frequency, urgency, nocturia x 4-5, soft bm's. Denies pain, dysuria, loss of appetite, fatigue.    The patient is without complaint.                                 Set-up films were reviewed.                                 The chart was checked. Physical Findings. . . Weight essentially stable.  No significant changes.  Filed Vitals:   10/08/11 1036  BP: 122/70  Pulse: 76  Temp: 98.4 F (36.9 C)  Resp: 20   Impression . . . . . . . The patient is  tolerating radiation. Plan . . . . . . . . . . . . Continue treatment as planned.  ________________________________  [name]. Kathrynn Running, M.D.

## 2011-10-08 NOTE — Progress Notes (Signed)
Pt reports urinary frequency, urgency, nocturia x 4-5, soft bm's. Denies pain, dysuria, loss of appetite, fatigue.

## 2011-10-08 NOTE — Assessment & Plan Note (Signed)
Currently receiving external radiation was planned prostate seed implant boost

## 2011-10-11 ENCOUNTER — Ambulatory Visit
Admission: RE | Admit: 2011-10-11 | Discharge: 2011-10-11 | Disposition: A | Discharge status: Home / Self Care | Payer: Medicare Other | Source: Ambulatory Visit | Attending: Radiation Oncology | Admitting: Radiation Oncology

## 2011-10-12 ENCOUNTER — Ambulatory Visit
Admission: RE | Admit: 2011-10-12 | Discharge: 2011-10-12 | Disposition: A | Discharge status: Home / Self Care | Payer: Medicare Other | Source: Ambulatory Visit | Attending: Radiation Oncology | Admitting: Radiation Oncology

## 2011-10-13 ENCOUNTER — Ambulatory Visit
Admission: RE | Admit: 2011-10-13 | Discharge: 2011-10-13 | Disposition: A | Discharge status: Home / Self Care | Payer: Medicare Other | Source: Ambulatory Visit | Attending: Radiation Oncology | Admitting: Radiation Oncology

## 2011-10-14 ENCOUNTER — Ambulatory Visit
Admission: RE | Admit: 2011-10-14 | Discharge: 2011-10-14 | Disposition: A | Discharge status: Home / Self Care | Payer: Medicare Other | Source: Ambulatory Visit | Attending: Radiation Oncology | Admitting: Radiation Oncology

## 2011-10-15 ENCOUNTER — Ambulatory Visit
Admission: RE | Admit: 2011-10-15 | Discharge: 2011-10-15 | Disposition: A | Discharge status: Home / Self Care | Payer: Medicare Other | Source: Ambulatory Visit | Attending: Radiation Oncology | Admitting: Radiation Oncology

## 2011-10-15 ENCOUNTER — Encounter: Payer: Self-pay | Admitting: Radiation Oncology

## 2011-10-15 VITALS — BP 126/83 | HR 70 | Temp 98.0°F | Resp 20 | Wt 177.2 lb

## 2011-10-15 DIAGNOSIS — C61 Malignant neoplasm of prostate: Secondary | ICD-10-CM

## 2011-10-15 MED ORDER — BIAFINE EX EMUL
Freq: Two times a day (BID) | CUTANEOUS | Status: DC
  Administered 2011-10-15: 11:00:00 via TOPICAL

## 2011-10-15 NOTE — Assessment & Plan Note (Signed)
Completed 24/25 external radiation treatments to be followed by seed implant.

## 2011-10-15 NOTE — Progress Notes (Signed)
Pt states he is having frequent soft stools but denies diarrhea; advised he take Imodium if he develops diarrhea. Pt continues to have urinary freq, nocturia x 4-5, denies dysuria. Pt states he has skin irritation on his buttocks, has been applying Eucerin lotion; gave pt Biafine lotion w/instructions for proper use. Pt and wife verbalized understanding.

## 2011-10-15 NOTE — Progress Notes (Signed)
  Radiation Oncology         [phone] ________________________________  Name: Derek Carr MRN: [id number]  Date: 10/15/2011  DOB: 1931-08-21  Weekly Radiation Therapy Management 78 year old gentleman with stage T3 adenocarcinoma prostate with Gleason score 4+4 and PSA of 10.3 Completed 24/25 external radiation treatments to be followed by seed implant.    Current Dose: 43.2 Gy     Planned Dose:  45 Gy  Narrative . . . . . . . . The patient presents for routine under treatment assessment following his penultimate fraction of radiation.         Pt states he is having frequent soft stools but denies diarrhea; advised he take Imodium if he develops diarrhea. Pt continues to have urinary freq, nocturia x 4-5, denies dysuria. Pt states he has skin irritation on his buttocks, has been applying Eucerin lotion; gave pt Biafine lotion w/instructions for proper use. Pt and wife verbalized understanding.                                 Set-up films were reviewed.                                 The chart was checked. Physical Findings. . . Weight essentially stable. Filed Weights   10/15/11 1057  Weight: 177 lb 3.2 oz (80.377 kg)   Filed Vitals:   10/15/11 1057  BP: 126/83  Pulse: 70  Temp: 98 F (36.7 C)  Resp: 20  No significant changes. Impression . . . . . . . The patient is  tolerating radiation. Plan . . . . . . . . . . . . Complete treatment as planned Monday.  ________________________________  [name]. Kathrynn Running, M.D.

## 2011-10-15 NOTE — Addendum Note (Signed)
Encounter addended by: Glennie Hawk, RN on: 10/15/2011  1:16 PM<BR>     Documentation filed: Inpatient MAR, Orders

## 2011-10-18 ENCOUNTER — Ambulatory Visit
Admission: RE | Admit: 2011-10-18 | Discharge: 2011-10-18 | Disposition: A | Discharge status: Home / Self Care | Payer: Medicare Other | Source: Ambulatory Visit | Attending: Radiation Oncology | Admitting: Radiation Oncology

## 2011-10-18 ENCOUNTER — Encounter: Payer: Self-pay | Admitting: Radiation Oncology

## 2011-10-18 ENCOUNTER — Encounter (HOSPITAL_BASED_OUTPATIENT_CLINIC_OR_DEPARTMENT_OTHER)
Admission: RE | Admit: 2011-10-18 | Discharge: 2011-10-18 | Disposition: A | Discharge status: Home / Self Care | Payer: Medicare Other | Source: Ambulatory Visit | Attending: Urology | Admitting: Urology

## 2011-10-18 DIAGNOSIS — R9431 Abnormal electrocardiogram [ECG] [EKG]: Secondary | ICD-10-CM | POA: Insufficient documentation

## 2011-10-21 NOTE — Progress Notes (Signed)
  Radiation Oncology         [phone] ________________________________  Name: Derek Carr MRN: [id number]  Date: 10/18/2011  DOB: 01-18-1932  End of Treatment Note  Diagnosis:   78 year old gentleman with stage T3a adenocarcinoma of the prostate with Gleason score 4+4 and PSA of 10.3  Indication for treatment:  Curative, External radiation followed by seed implant.       Radiation treatment dates:   09/13/2011-10/18/2011  Site/dose:   The pelvis was treated to 45 Gy in 25 fractions to be followed by seed implant of 110 Gy for total of 155 Gy.  Beams/energy:   The patient was treated with a standard four field technique using MLC apertures to shape radiation around the prostate and seminal vesicles from anterior, posterior, and bilateral beam angles. Image guidance was carried out daily with x-rays to target the three gold markers in the prostate.  Narrative: The patient tolerated radiation treatment relatively well.   He had some mild dysuria.  Plan: The patient has completed external radiation treatment. The patient will undergo seed boost.  ________________________________  [name]. Kathrynn Running, M.D.

## 2011-11-05 ENCOUNTER — Telehealth: Payer: Self-pay | Admitting: *Deleted

## 2011-11-05 NOTE — Telephone Encounter (Signed)
CALLED PATIENT TO REMIND OF APPT., LVM FOR A RETURN CALL 

## 2011-11-08 ENCOUNTER — Other Ambulatory Visit (HOSPITAL_COMMUNITY): Payer: Self-pay | Admitting: *Deleted

## 2011-11-08 LAB — PROTIME-INR
INR: 0.96 (ref 0.00–1.49)
Prothrombin Time: 12.7 seconds (ref 11.6–15.2)

## 2011-11-08 LAB — COMPREHENSIVE METABOLIC PANEL
Albumin: 3.3 g/dL — ABNORMAL LOW (ref 3.5–5.2)
BUN: 14 mg/dL (ref 6–23)
Calcium: 9.5 mg/dL (ref 8.4–10.5)
Creatinine, Ser: 1.02 mg/dL (ref 0.50–1.35)
GFR calc Af Amer: 80 mL/min — ABNORMAL LOW (ref >90)
Glucose, Bld: 86 mg/dL (ref 70–99)
Potassium: 3.5 mEq/L (ref 3.5–5.1)
Total Protein: 7.6 g/dL (ref 6.0–8.3)

## 2011-11-08 LAB — CBC
HCT: 36.8 % — ABNORMAL LOW (ref 39.0–52.0)
MCH: 27.1 pg (ref 26.0–34.0)
MCHC: 32.9 g/dL (ref 30.0–36.0)
MCV: 82.5 fL (ref 78.0–100.0)
RDW: 14.8 % (ref 11.5–15.5)

## 2011-11-11 ENCOUNTER — Encounter (HOSPITAL_BASED_OUTPATIENT_CLINIC_OR_DEPARTMENT_OTHER): Payer: Self-pay | Admitting: *Deleted

## 2011-11-11 NOTE — Progress Notes (Signed)
NPO AFTER MN WITH EXCEPTION CLEAR LIQUIDS UNTIL 0800 (NO CREAM/ MILK PRODUCTS). ARRIVES AT 1245. CURRENT LAB WORK, EKG AND CXR IN EPIC AND CHART. WILL TAKE NORVASC AM OF SURG W/ SIP OF WATER AND DO FLEET ENEMA.

## 2011-11-12 ENCOUNTER — Telehealth: Payer: Self-pay | Admitting: *Deleted

## 2011-11-12 NOTE — Telephone Encounter (Signed)
XXXX 

## 2011-11-12 NOTE — Telephone Encounter (Signed)
CALLED PATIENT TO REMIND OF PROCEDURE FOR 11-15-11, SPOKE WITH PATIENT'S WIFE AND THEY ARE AWARE OF THIS PROCEDURE.

## 2011-11-14 NOTE — Op Note (Signed)
  Radiation Oncology         [phone] ________________________________  Name: Derek Carr MRN: [id number]  Date: 09/20/2011  DOB: 1931/11/23       Prostate Seed Implant  WU:[id number],YNWG A, MD  No ref. provider found  DIAGNOSIS: 78 year old gentleman with stage T3a adenocarcinoma of the prostate with Gleason score 4+4 and PSA of 10.3  PROCEDURE: Insertion of radioactive I-125 seeds into the prostate gland.  RADIATION DOSE: 110 Gy, boost therapy.  TECHNIQUE: Derek Carr was brought to the operating room with the urologist. He was placed in the dorsolithotomy position. He was catheterized and a rectal tube was inserted. The perineum was shaved, prepped and draped. The ultrasound probe was then introduced into the rectum to see the prostate gland.  TREATMENT DEVICE: A needle grid was attached to the ultrasound probe stand and anchor needles were placed.  COMPLEX ISODOSE CALCULATION: The prostate was imaged in 3D using a sagittal sweep of the prostate probe. These images were transferred to the planning computer. There, the prostate, urethra and rectum were defined on each axial reconstructed image. Then, the software created an optimized plan and a few seed positions were adjusted. Then the accepted plan was uploaded to the seed Selectron afterloading unit.  SPECIAL TREATMENT PROCEDURE/SUPERVISION AND HANDLING: The Nucletron FIRST system was used to place the needles under sagittal guidance. A total of 23 needles were used to deposit 52 seeds in the prostate gland. The individual seed activity was 0.407 mCi for a total implant activity of 21.164 mCi.  COMPLEX SIMULATION: At the end of the procedure, an anterior radiograph of the pelvis was obtained to document seed positioning and count. Cystoscopy was performed to check the urethra and bladder.  MICRODOSIMETRY: At the end of the procedure, the patient was emitting 0.8 mrem/hr at 1 meter. Accordingly, he was considered safe for  hospital discharge.  PLAN: The patient will return to the radiation oncology clinic for post implant CT dosimetry in three weeks.   ________________________________  [name] Kathrynn Running, M.D.

## 2011-11-14 NOTE — H&P (Signed)
History of Present Illness  [name] is a 78 year old previously followed by Dr. Aldean Ast with the following urologic history:  1) Prostate cancer: He has no family history of prostate cancer.  His PSA has fluctuated in the past and was 5.5 in 2010 but subsequently decreased. He had previously been followed by Dr. Aldean Ast.  His PSA was found to have increased to 8.9 when checked by Dr. Waynard Edwards during a routine physical examination in January 2013. He was initially seen by me in consultation in March 2013 due to this increase in his PSA which was repeated and found to be 10.3. His DRE at that time was worrisome for possible locally advanced prostate cancer.  He underwent a prostate biopsy on 05/11/11 which demostrated a large hypoechoic mass all along the left lateral aspect of the prostate from apex to base with evidence for extraprostatic extension. His pathology demonstrated Gleason 4+4=8 adenocarcinoma of the prostate with 7 out of 12 biopsy cores positive for malignancy including 6 out of 6 cores on the left.  TNM stage: cT3a N0 M0 PSA: 10.3 Gleason score: 4+4=8 Biopsy (05/11/11): 7/12 cores positive    Left: L lateral apex (80%, 4+4=8, EPE), L apex (90%, 4+4=8, PNI), L lateral mid (20%, 3+4=7), L lateral base (50%, 4+4=8, PNI), L base (20%, 4+4=8, PNI)    Right: R lateral apex 10%, 3+4=7, PNI) Prostate volume: 45.7 cc  2) BPH/LUTS: His baseline symptoms include nocturia two times per night.  His symptoms have not warranted therapy. Baseline IPSS: 4.  3) Erectile dysfunction: He has noted worsening erectile function. He can obtain partial erections although has difficulty obtaining a full erection or maintaining an erection. He does apparently have a prescription from Dr. Waynard Edwards for Viagra although has not filled this prescription and has never tried PDE 5 inhibitors previously. He does not take nitrate medications.  Interval history:  He follows up today for further evaluation and  surveillance of his prostate cancer. He completed external beam radiation therapy as of 3 weeks ago. He tolerated treatment quite well under the care of Dr. Kathrynn Running. Aside from mild urinary frequency and diarrhea, he describes no problems. The symptoms have subsequently improved. He follows up today for a PSA and testosterone level. He is scheduled to undergo a radiation seed implantation boost on Monday.     Past Medical History Problems  1. History of  Arthritis V13.4 2. History of  Herpes Zoster (Shingles) 053.9 3. History of  Hypercholesterolemia 272.0 4. History of  Hypertension 401.9 5. Prostate Cancer 185  Surgical History Problems  1. History of  Back Surgery 2. History of  Umbilical Hernia Repair  Current Meds 1. AmLODIPine Besylate TABS; Therapy: (Recorded:09Dec2010) to 2. Aspirin 81 MG Oral Tablet; Therapy: (Recorded:03Nov2010) to 3. Bicalutamide 50 MG Oral Tablet; Take 1 tablet daily; Therapy: 11Jun2013 to (Evaluate:06Jun2014)   Requested for: 11Jun2013; Last Rx:11Jun2013 4. Cyclogyl 2 % Ophthalmic Solution; Therapy: 02Dec2010 to 5. Folic Acid 1 MG Oral Tablet; Therapy: (Recorded:11Jun2013) to 6. Fosamax 70 MG Oral Tablet; Therapy: (Recorded:11Jun2013) to 7. HydrOXYzine HCl 10 MG Oral Tablet; Therapy: 20Aug2010 to 8. Lupron Depot 30 MG Intramuscular Kit; INJECT 30 MG Intramuscular; To Be Done: 16Oct2013;  Status: HOLD FOR - Administration 9. Pravastatin Sodium 80 MG Oral Tablet; Therapy: 24May2010 to 10. Protopic 0.1 % External Ointment; Therapy: 25Jun2010 to 11. Vigamox 0.5 % Ophthalmic Solution; Therapy: 29Nov2010 to 12. Vitamin D TABS; Therapy: (Recorded:03Nov2010) to  Allergies Medication  1. No Known Drug Allergies  Family  History Problems  1. Fraternal history of  Kidney Cancer V16.51 2. Fraternal history of  Stroke Syndrome V17.1  Social History Problems  1. Marital History - Currently Married 2. Retired From Work 3. Tobacco Use V15.82 Has smoked off  and on for 20-25 yrs Denied  4. History of  Alcohol Use  Vitals Vital Signs [Data Includes: Last 1 Day]  16Oct2013 02:44PM  BMI Calculated: 22.24 BSA Calculated: 2.08 Height: 6 ft 3 in Weight: 177 lb  Blood Pressure: 147 / 87 Heart Rate: 83  Physical Exam Constitutional: Well nourished and well developed . No acute distress.  ENT:. The ears and nose are normal in appearance.  Neck: The appearance of the neck is normal and no neck mass is present.  Pulmonary: No respiratory distress and normal respiratory rhythm and effort.  Cardiovascular: Heart rate and rhythm are normal . No peripheral edema.    Results/Data Urine [Data Includes: Last 1 Day]   16Oct2013 COLOR YELLOW  APPEARANCE CLEAR  SPECIFIC GRAVITY 1.025  pH 5.5  GLUCOSE NEG mg/dL BILIRUBIN NEG  KETONE NEG mg/dL BLOOD TRACE  PROTEIN TRACE mg/dL UROBILINOGEN 0.2 mg/dL NITRITE NEG  LEUKOCYTE ESTERASE NEG  SQUAMOUS EPITHELIAL/HPF RARE  WBC NONE SEEN WBC/hpf RBC 0-2 RBC/hpf BACTERIA RARE  CRYSTALS NONE SEEN  CASTS NONE SEEN  Selected Results  PSA 09Oct2013 10:50AM Heloise Purpura SPECIMEN TYPE: BLOOD  Test Name Result Flag Reference PSA 0.25 ng/mL  <=4.00 Test Methodology: ECLIA PSA (Electrochemiluminescence Immunoassay)  TESTOSTERONE 09Oct2013 10:50AM Heloise Purpura SPECIMEN TYPE: BLOOD  Test Name Result Flag Reference TESTOSTERONE, TOTAL <10.00 ng/dL L [id number] Tanner Stage       Male              Male               I              < 30 ng/dL        < 10 ng/dL               II             < 150 ng/dL       < 30 ng/dL               III            100-320 ng/dL     < 35 ng/dL               IV             200-970 ng/dL     91-47 ng/dL               V/Adult        300-890 ng/dL     82-95 ng/dL  Assessment Assessed  1. Prostate Cancer 185  Plan Health Maintenance (V70.0)  1. UA With REFLEX  Done: 16Oct2013 02:34PM Prostate Cancer (185)  2. Lupron Depot 30 MG Intramuscular Kit; INJECT 30 MG  Intramuscular; To Be Done: 16Oct2013;  Status: HOLD FOR - Administration 3. PSA  Requested for: 16Oct2013 4. TESTOSTERONE  Requested for: 16Oct2013 5. Follow-up Keep Future Appt Office  Follow-up  Requested for: 16Oct2013 6. Follow-up Month x 4 Office  Follow-up  Requested for: 16Oct2013  Discussion/Summary  1. Prostate cancer: He has thus far tolerated therapy quite well. His PSA had declined as expected on androgen deprivation therapy. His testosterone is well suppressed. He will plan to proceed on Monday with a radiation  seed implantation boost with myself and Dr. Kathrynn Running. He also will be tentatively scheduled for followup appointment and laboratory studies in 4 months for his next Lupron injection. He will receive Lupron 30 mg today. He will also keep his appointment for a few weeks after his upcoming seed implant to check his PVR and ensure that he is emptying his bladder appropriately.  Cc: Dr. Margaretmary Dys     Signatures Electronically signed by : Heloise Purpura, M.D.; Nov 10 2011  3:12PM

## 2011-11-15 ENCOUNTER — Ambulatory Visit (HOSPITAL_BASED_OUTPATIENT_CLINIC_OR_DEPARTMENT_OTHER)
Admission: RE | Admit: 2011-11-15 | Discharge: 2011-11-15 | Disposition: A | Discharge status: Home / Self Care | Payer: Medicare Other | Source: Ambulatory Visit | Attending: Urology | Admitting: Urology

## 2011-11-15 ENCOUNTER — Ambulatory Visit (HOSPITAL_COMMUNITY): Payer: Medicare Other

## 2011-11-15 ENCOUNTER — Encounter (HOSPITAL_BASED_OUTPATIENT_CLINIC_OR_DEPARTMENT_OTHER)
Admission: RE | Disposition: A | Discharge status: Home / Self Care | Payer: Self-pay | Source: Ambulatory Visit | Attending: Urology

## 2011-11-15 ENCOUNTER — Ambulatory Visit (HOSPITAL_BASED_OUTPATIENT_CLINIC_OR_DEPARTMENT_OTHER): Payer: Medicare Other | Admitting: Anesthesiology

## 2011-11-15 ENCOUNTER — Encounter (HOSPITAL_BASED_OUTPATIENT_CLINIC_OR_DEPARTMENT_OTHER): Payer: Self-pay | Admitting: Anesthesiology

## 2011-11-15 ENCOUNTER — Encounter (HOSPITAL_BASED_OUTPATIENT_CLINIC_OR_DEPARTMENT_OTHER): Payer: Self-pay | Admitting: *Deleted

## 2011-11-15 DIAGNOSIS — E7800 Pure hypercholesterolemia, unspecified: Secondary | ICD-10-CM | POA: Insufficient documentation

## 2011-11-15 DIAGNOSIS — Z7982 Long term (current) use of aspirin: Secondary | ICD-10-CM | POA: Insufficient documentation

## 2011-11-15 DIAGNOSIS — C61 Malignant neoplasm of prostate: Secondary | ICD-10-CM | POA: Insufficient documentation

## 2011-11-15 DIAGNOSIS — N401 Enlarged prostate with lower urinary tract symptoms: Secondary | ICD-10-CM | POA: Insufficient documentation

## 2011-11-15 DIAGNOSIS — Z79899 Other long term (current) drug therapy: Secondary | ICD-10-CM | POA: Insufficient documentation

## 2011-11-15 DIAGNOSIS — N138 Other obstructive and reflux uropathy: Secondary | ICD-10-CM | POA: Insufficient documentation

## 2011-11-15 DIAGNOSIS — N529 Male erectile dysfunction, unspecified: Secondary | ICD-10-CM | POA: Insufficient documentation

## 2011-11-15 DIAGNOSIS — I10 Essential (primary) hypertension: Secondary | ICD-10-CM | POA: Insufficient documentation

## 2011-11-15 HISTORY — PX: RADIOACTIVE SEED IMPLANT: SHX5150

## 2011-11-15 HISTORY — PX: CYSTOSCOPY: SHX5120

## 2011-11-15 HISTORY — DX: Osteoarthritis of hip, unspecified: M16.9

## 2011-11-15 HISTORY — DX: Atopic dermatitis, unspecified: L20.9

## 2011-11-15 HISTORY — DX: Personal history of other infectious and parasitic diseases: Z86.19

## 2011-11-15 HISTORY — DX: Nocturia: R35.1

## 2011-11-15 HISTORY — DX: Simple chronic bronchitis: J41.0

## 2011-11-15 HISTORY — DX: Frequency of micturition: R35.0

## 2011-11-15 SURGERY — INSERTION, RADIATION SOURCE, PROSTATE
Anesthesia: General | Site: Prostate | Wound class: Clean Contaminated

## 2011-11-15 MED ORDER — TAMSULOSIN HCL 0.4 MG PO CAPS: 0.4000 mg | ORAL_CAPSULE | Freq: Every day | ORAL | Status: DC

## 2011-11-15 MED ORDER — KETOROLAC TROMETHAMINE 30 MG/ML IJ SOLN: 15.0000 mg | Freq: Once | INTRAMUSCULAR | Status: DC | PRN

## 2011-11-15 MED ORDER — FAMOTIDINE IN NACL 20-0.9 MG/50ML-% IV SOLN
20.0000 mg | Freq: Once | INTRAVENOUS | Status: AC
  Administered 2011-11-15: 20 mg via INTRAVENOUS

## 2011-11-15 MED ORDER — IOHEXOL 350 MG/ML SOLN
INTRAVENOUS | Status: DC | PRN
  Administered 2011-11-15: 7 mL

## 2011-11-15 MED ORDER — LIDOCAINE HCL (CARDIAC) 20 MG/ML IV SOLN
INTRAVENOUS | Status: DC | PRN
  Administered 2011-11-15: 50 mg via INTRAVENOUS

## 2011-11-15 MED ORDER — LACTATED RINGERS IV SOLN
INTRAVENOUS | Status: DC
  Administered 2011-11-15: 100 mL/h via INTRAVENOUS
  Administered 2011-11-15: 15:00:00 via INTRAVENOUS

## 2011-11-15 MED ORDER — FENTANYL CITRATE 0.05 MG/ML IJ SOLN
INTRAMUSCULAR | Status: DC | PRN
  Administered 2011-11-15 (×2): 25 ug via INTRAVENOUS
  Administered 2011-11-15: 50 ug via INTRAVENOUS

## 2011-11-15 MED ORDER — PROMETHAZINE HCL 25 MG/ML IJ SOLN: 6.2500 mg | INTRAMUSCULAR | Status: DC | PRN

## 2011-11-15 MED ORDER — HYDROCODONE-ACETAMINOPHEN 5-325 MG PO TABS: 1.0000 | ORAL_TABLET | Freq: Four times a day (QID) | ORAL | Status: DC | PRN

## 2011-11-15 MED ORDER — EPHEDRINE SULFATE 50 MG/ML IJ SOLN
INTRAMUSCULAR | Status: DC | PRN
  Administered 2011-11-15 (×2): 10 mg via INTRAVENOUS

## 2011-11-15 MED ORDER — ACETAMINOPHEN 10 MG/ML IV SOLN
INTRAVENOUS | Status: DC | PRN
  Administered 2011-11-15: 1000 mg via INTRAVENOUS

## 2011-11-15 MED ORDER — DOCUSATE SODIUM 100 MG PO CAPS: 100.0000 mg | ORAL_CAPSULE | Freq: Two times a day (BID) | ORAL | Status: DC

## 2011-11-15 MED ORDER — PROPOFOL 10 MG/ML IV BOLUS
INTRAVENOUS | Status: DC | PRN
  Administered 2011-11-15: 200 mg via INTRAVENOUS

## 2011-11-15 MED ORDER — DEXAMETHASONE SODIUM PHOSPHATE 4 MG/ML IJ SOLN
INTRAMUSCULAR | Status: DC | PRN
  Administered 2011-11-15: 10 mg via INTRAVENOUS

## 2011-11-15 MED ORDER — CIPROFLOXACIN HCL 500 MG PO TABS: 500.0000 mg | ORAL_TABLET | Freq: Two times a day (BID) | ORAL | Status: DC

## 2011-11-15 MED ORDER — CIPROFLOXACIN IN D5W 400 MG/200ML IV SOLN
400.0000 mg | INTRAVENOUS | Status: AC
  Administered 2011-11-15: 400 mg via INTRAVENOUS

## 2011-11-15 MED ORDER — STERILE WATER FOR IRRIGATION IR SOLN
Status: DC | PRN
  Administered 2011-11-15: 3000 mL

## 2011-11-15 MED ORDER — BELLADONNA ALKALOIDS-OPIUM 16.2-60 MG RE SUPP
RECTAL | Status: DC | PRN
  Administered 2011-11-15: 1 via RECTAL

## 2011-11-15 MED ORDER — FENTANYL CITRATE 0.05 MG/ML IJ SOLN: 25.0000 ug | INTRAMUSCULAR | Status: DC | PRN

## 2011-11-15 MED ORDER — FLEET ENEMA 7-19 GM/118ML RE ENEM: 1.0000 | ENEMA | Freq: Once | RECTAL | Status: DC

## 2011-11-15 SURGICAL SUPPLY — 28 items
BAG DRAIN URO-CYSTO SKYTR STRL (DRAIN) ×3 IMPLANT
BAG URINE DRAINAGE (UROLOGICAL SUPPLIES) ×3 IMPLANT
BLADE SURG ROTATE 9660 (MISCELLANEOUS) ×3 IMPLANT
CATH COUDE FOLEY 2W 5CC 16FR (CATHETERS) ×3 IMPLANT
CATH FOLEY 2WAY SLVR  5CC 16FR (CATHETERS) ×2
CATH FOLEY 2WAY SLVR 5CC 16FR (CATHETERS) ×4 IMPLANT
CATH ROBINSON RED A/P 20FR (CATHETERS) ×3 IMPLANT
CLOTH BEACON ORANGE TIMEOUT ST (SAFETY) ×3 IMPLANT
COVER MAYO STAND STRL (DRAPES) ×3 IMPLANT
COVER TABLE BACK 60X90 (DRAPES) ×3 IMPLANT
DRAPE CAMERA CLOSED 9X96 (DRAPES) IMPLANT
DRSG TEGADERM 4X4.75 (GAUZE/BANDAGES/DRESSINGS) ×3 IMPLANT
DRSG TEGADERM 8X12 (GAUZE/BANDAGES/DRESSINGS) ×3 IMPLANT
ELECT REM PT RETURN 9FT ADLT (ELECTROSURGICAL)
ELECTRODE REM PT RTRN 9FT ADLT (ELECTROSURGICAL) IMPLANT
GLOVE BIO SURGEON STRL SZ7.5 (GLOVE) ×6 IMPLANT
GLOVE ECLIPSE 8.0 STRL XLNG CF (GLOVE) ×9 IMPLANT
GLOVE INDICATOR 6.5 STRL GRN (GLOVE) ×12 IMPLANT
GOWN PREVENTION PLUS LG XLONG (DISPOSABLE) ×3 IMPLANT
GOWN STRL REIN XL XLG (GOWN DISPOSABLE) ×3 IMPLANT
HOLDER FOLEY CATH W/STRAP (MISCELLANEOUS) ×3 IMPLANT
PACK CYSTOSCOPY (CUSTOM PROCEDURE TRAY) ×3 IMPLANT
RADIOACTIVE SEEDS ×3 IMPLANT
SPONGE GAUZE 4X4 12PLY (GAUZE/BANDAGES/DRESSINGS) ×3 IMPLANT
SYRINGE 10CC LL (SYRINGE) ×3 IMPLANT
UNDERPAD 30X30 INCONTINENT (UNDERPADS AND DIAPERS) ×9 IMPLANT
WATER STERILE IRR 3000ML UROMA (IV SOLUTION) ×3 IMPLANT
WATER STERILE IRR 500ML POUR (IV SOLUTION) ×3 IMPLANT

## 2011-11-15 NOTE — Transfer of Care (Signed)
Immediate Anesthesia Transfer of Care Note  Patient: Derek Carr  Procedure(s) Performed: Procedure(s) (LRB) with comments: RADIOACTIVE SEED IMPLANT (N/A) - Total number of seeds - 52 CYSTOSCOPY (N/A) - no seeds seen in bladder  Patient Location: PACU  Anesthesia Type: General  Level of Consciousness: sedated and responds to stimulation  Airway & Oxygen Therapy: Patient Spontanous Breathing and Patient connected to nasal cannula oxygen  Post-op Assessment: Report given to PACU RN  Post vital signs: Reviewed and stable  Complications: No apparent anesthesia complications

## 2011-11-15 NOTE — Anesthesia Preprocedure Evaluation (Signed)
Anesthesia Evaluation  Patient identified by MRN, date of birth, ID band Patient awake    Reviewed: Allergy & Precautions, H&P , NPO status , Patient's Chart, lab work & pertinent test results  Airway Mallampati: II TM Distance: >3 FB Neck ROM: Limited    Dental No notable dental hx. (+) Dental Advisory Given   Pulmonary Current Smoker,    + decreased breath sounds      Cardiovascular hypertension, Pt. on medications Rhythm:Regular Rate:Normal  bigeminy   Neuro/Psych negative neurological ROS  negative psych ROS   GI/Hepatic negative GI ROS, Neg liver ROS,   Endo/Other  negative endocrine ROS  Renal/GU negative Renal ROS  negative genitourinary   Musculoskeletal negative musculoskeletal ROS (+)   Abdominal   Peds negative pediatric ROS (+)  Hematology negative hematology ROS (+)   Anesthesia Other Findings   Reproductive/Obstetrics negative OB ROS                           Anesthesia Physical Anesthesia Plan  ASA: III  Anesthesia Plan: General   Post-op Pain Management:    Induction: Intravenous  Airway Management Planned: LMA  Additional Equipment:   Intra-op Plan:   Post-operative Plan:   Informed Consent: I have reviewed the patients History and Physical, chart, labs and discussed the procedure including the risks, benefits and alternatives for the proposed anesthesia with the patient or authorized representative who has indicated his/her understanding and acceptance.   Dental advisory given  Plan Discussed with: Surgeon and CRNA  Anesthesia Plan Comments:         Anesthesia Quick Evaluation

## 2011-11-15 NOTE — Op Note (Signed)
Preoperative diagnosis: Clinically localized adenocarcinoma of the prostate (cT3a N0 M0)  Postoperative diagnosis: Clinically localized adenocarcinoma of the prostate  Procedure: 1) Transperineal placement of radioactive seeds into the prostate                     2) Cystoscopy  Surgeon: Moody Bruins. M.D.  Radiation oncologist:  Margaretmary Dys, M.D.  Anesthesia: General  EBL: Minimal  Complications: None  Indication: Derek Carr is a 78 y.o. gentleman with clinically localized prostate cancer. After discussing management options for treatment, he elected to proceed with radiotherapy. He presents today for the above procedures. The potential risks, complications, alternative options, and expected recovery course have been discussed in detail with the patient and he has provided informed consent to proceed.  Description of procedure: The patient was taken to the operating room and general anesthesia was induced. He was administered preoperative antibiotics, placed in the dorsal lithotomy position, and prepped and draped in the usual sterile fashion. Next, intraoperative transrectal ultrasonography was utilized for real-time intraoperative planning by the radiation oncology team. Once the treatment plan was completed, radiation seeds were placed utilizing a brachytherapy perineal template and the robotic Nucletron was utilized to place 52 radioactive iodine 125 seeds into the prostate through 23 needles for a total treatment dose of 110 Gy.  Position of the radiation seeds was confirmed on fluoroscopic imaging.  Flexible cystoscopy was then performed and no seeds were identified within the bladder.  No bladder tumors, stones, or other mucosal pathology was identified within the bladder. A urethral catheter was inserted at the end of the procedure.  He tolerated the procedure well and without complications. He was able to be transferred to the recovery until is satisfactory condition.

## 2011-11-15 NOTE — Anesthesia Procedure Notes (Signed)
Procedure Name: LMA Insertion Date/Time: 11/15/2011 2:18 PM Performed by: Maris Berger T Pre-anesthesia Checklist: Patient identified, Emergency Drugs available, Suction available and Patient being monitored Patient Re-evaluated:Patient Re-evaluated prior to inductionOxygen Delivery Method: Circle System Utilized Preoxygenation: Pre-oxygenation with 100% oxygen Intubation Type: IV induction Ventilation: Mask ventilation without difficulty LMA: LMA with gastric port inserted LMA Size: 5.0 Number of attempts: 2 Placement Confirmation: positive ETCO2 Tube secured with: Tape Dental Injury: Teeth and Oropharynx as per pre-operative assessment  Comments: Large leak on first insert, repositioned with improvement.

## 2011-11-15 NOTE — Anesthesia Postprocedure Evaluation (Signed)
  Anesthesia Post-op Note  Patient: Derek Carr  Procedure(s) Performed: Procedure(s) (LRB): RADIOACTIVE SEED IMPLANT (N/A) CYSTOSCOPY (N/A)  Patient Location: PACU  Anesthesia Type: General  Level of Consciousness: awake and alert   Airway and Oxygen Therapy: Patient Spontanous Breathing  Post-op Pain: mild  Post-op Assessment: Post-op Vital signs reviewed, Patient's Cardiovascular Status Stable, Respiratory Function Stable, Patent Airway and No signs of Nausea or vomiting  Post-op Vital Signs: stable  Complications: No apparent anesthesia complications

## 2011-11-15 NOTE — Interval H&P Note (Signed)
History and Physical Interval Note:  11/15/2011 12:26 PM  Derek Carr  has presented today for surgery, with the diagnosis of PROSTATE CANCER  The various methods of treatment have been discussed with the patient and family. After consideration of risks, benefits and other options for treatment, the patient has consented to  Procedure(s) (LRB) with comments: RADIOACTIVE SEED IMPLANT (N/A) - 2 HRS  rad tech ok per vickie at  main dr portable needed CYSTOSCOPY (N/A) as a surgical intervention .  The patient's history has been reviewed, patient examined, no change in status, stable for surgery.  I have reviewed the patient's chart and labs.  Questions were answered to the patient's satisfaction.     Nedda Gains,LES

## 2011-11-17 ENCOUNTER — Encounter (HOSPITAL_BASED_OUTPATIENT_CLINIC_OR_DEPARTMENT_OTHER): Payer: Self-pay | Admitting: Urology

## 2011-12-08 ENCOUNTER — Telehealth: Payer: Self-pay | Admitting: *Deleted

## 2011-12-08 DIAGNOSIS — E785 Hyperlipidemia, unspecified: Secondary | ICD-10-CM | POA: Insufficient documentation

## 2011-12-08 DIAGNOSIS — R351 Nocturia: Secondary | ICD-10-CM | POA: Insufficient documentation

## 2011-12-08 DIAGNOSIS — Z8619 Personal history of other infectious and parasitic diseases: Secondary | ICD-10-CM | POA: Insufficient documentation

## 2011-12-08 DIAGNOSIS — M419 Scoliosis, unspecified: Secondary | ICD-10-CM | POA: Insufficient documentation

## 2011-12-08 DIAGNOSIS — M169 Osteoarthritis of hip, unspecified: Secondary | ICD-10-CM | POA: Insufficient documentation

## 2011-12-08 DIAGNOSIS — Z77090 Contact with and (suspected) exposure to asbestos: Secondary | ICD-10-CM | POA: Insufficient documentation

## 2011-12-08 DIAGNOSIS — N529 Male erectile dysfunction, unspecified: Secondary | ICD-10-CM | POA: Insufficient documentation

## 2011-12-08 DIAGNOSIS — M1990 Unspecified osteoarthritis, unspecified site: Secondary | ICD-10-CM | POA: Insufficient documentation

## 2011-12-08 DIAGNOSIS — R350 Frequency of micturition: Secondary | ICD-10-CM | POA: Insufficient documentation

## 2011-12-08 DIAGNOSIS — L209 Atopic dermatitis, unspecified: Secondary | ICD-10-CM | POA: Insufficient documentation

## 2011-12-08 NOTE — Telephone Encounter (Signed)
CALLED PATIENT TO REMIND OF APPTS. FOR 12-09-11, CONFIRMED APPTS. WITH PATIENT'S WIFE.

## 2011-12-09 ENCOUNTER — Other Ambulatory Visit: Payer: Self-pay | Admitting: Radiation Oncology

## 2011-12-09 ENCOUNTER — Ambulatory Visit
Admission: RE | Admit: 2011-12-09 | Discharge: 2011-12-09 | Disposition: A | Discharge status: Home / Self Care | Payer: Medicare Other | Source: Ambulatory Visit | Attending: Radiation Oncology | Admitting: Radiation Oncology

## 2011-12-09 ENCOUNTER — Encounter: Payer: Self-pay | Admitting: Radiation Oncology

## 2011-12-09 VITALS — BP 132/86 | HR 80 | Temp 97.6°F | Resp 20 | Wt 181.7 lb

## 2011-12-09 DIAGNOSIS — C61 Malignant neoplasm of prostate: Secondary | ICD-10-CM

## 2011-12-09 NOTE — Progress Notes (Signed)
  Radiation Oncology         ([phone] ________________________________  Name: Derek Carr MRN: [id number]  Date: 12/09/2011  DOB: 14-Aug-1931  COMPLEX SIMULATION NOTE  NARRATIVE:  The patient was brought to the CT Simulation planning suite today following prostate seed implantation approximately one month ago.  Identity was confirmed.  All relevant records and images related to the planned course of therapy were reviewed.  Then, the patient was set-up supine.  CT images were obtained.  The CT images were loaded into the planning software.  Then the prostate and rectum were contoured.  Treatment planning then occurred.  The implanted iodine 125 seeds were identified by the physics staff for projection of radiation distribution  I have requested : 3D Simulation  I have requested a DVH of the following structures: Prostate and rectum.    ________________________________  [name] Kathrynn Running, M.D.

## 2011-12-09 NOTE — Progress Notes (Signed)
Radiation Oncology         [phone] ________________________________  Name: Derek Carr MRN: [id number]  Date: 12/09/2011  DOB: [birth year]  Follow-Up Visit Note  CC: Ezequiel Kayser, MD  Crecencio Mc, MD  Diagnosis:   78 year old gentleman with stage T3a adenocarcinoma of the prostate with Gleason score 4+4 and PSA of 10.3 s/p 45 Gy external radiation followed by radioactive I-125 seeds for 110 Gy, boost therapy  Interval Since Last Radiation:  3 weeks  Narrative:  The patient returns today for routine follow-up.  He is complaining of increased urinary frequency and urinary hesitation symptoms. He filled out a questionnaire regarding urinary function today indicating increased frequency. He denies any bowel symptoms.  ALLERGIES:   has no known allergies.  Meds: Current Outpatient Prescriptions  Medication Sig Dispense Refill  . alendronate (FOSAMAX) 70 MG tablet Take 70 mg by mouth every 7 (seven) days. Take with a full glass of water on an empty stomach.      Marland Kitchen amLODipine (NORVASC) 5 MG tablet Take 5 mg by mouth every morning.      . bicalutamide (CASODEX) 50 MG tablet Take 50 mg by mouth daily.      . cholecalciferol (VITAMIN D) 1000 UNITS tablet Take 1,000 Units by mouth daily.      . ciprofloxacin (CIPRO) 500 MG tablet Take 1 tablet (500 mg total) by mouth 2 (two) times daily.  6 tablet  0  . Cream Base (VANIBASE) CREA Apply topically daily.      Marland Kitchen docusate sodium (COLACE) 100 MG capsule Take 1 capsule (100 mg total) by mouth 2 (two) times daily.  30 capsule  0  . folic acid (FOLVITE) 1 MG tablet Take 1 mg by mouth daily.      Marland Kitchen HYDROcodone-acetaminophen (NORCO/VICODIN) 5-325 MG per tablet Take 1-2 tablets by mouth every 6 (six) hours as needed for pain.  25 tablet  0  . hydrOXYzine (ATARAX/VISTARIL) 10 MG tablet Take 10 mg by mouth 3 (three) times daily as needed.      Marland Kitchen leuprolide (LUPRON) 30 MG injection Inject 30 mg into the muscle every 4 (four) months.      .  pravastatin (PRAVACHOL) 80 MG tablet Take 80 mg by mouth every evening.       . tacrolimus (PROTOPIC) 0.03 % ointment Apply topically 2 (two) times daily.        Physical Findings: The patient is in no acute distress. Patient is alert and oriented.  weight is 181 lb 11.2 oz (82.419 kg). His oral temperature is 97.6 F (36.4 C). His blood pressure is 132/86 and his pulse is 80. His respiration is 20. Marland Kitchen  No significant changes.  Lab Findings: Lab Results  Component Value Date   WBC 3.6* 11/08/2011   HGB 12.1* 11/08/2011   HCT 36.8* 11/08/2011   MCV 82.5 11/08/2011   PLT 214 11/08/2011    Radiographic Findings:  Patient underwent CT imaging in our clinic for post implant dosimetry. The CT appears to demonstrate an adequate distribution of radioactive seeds throughout the prostate gland. There no seeds in her near the rectum. I suspect the final radiation plan and dosimetry will show appropriate coverage of the prostate gland.   Impression: The patient is recovering from the effects of radiation. His urinary symptoms should gradually improve over the next 4-6 months. We talked about this today. He is encouraged by his improvement already and is otherwise please with his outcome.   Plan: Today, I  spent time talking to the patient about his prostate seed implant and resolving urinary symptoms. Which for long-term followup for prostate cancer following seed implant. He understands that ongoing PSA determinations and digital rectal exams will help perform surveillance to rule out disease recurrence. He understands what to expect with his PSA measures. Patient was also educated today about some of the long-term effects would radiation including the Small risk for rectal bleeding and possibly erectile dysfunction. Talked about some of the general management approaches to these potential complications. However, I did encourage the patient to contact her office or return at any point if he has questions  or concerns related to his previous radiation and prostate cancer.   _____________________________________  [name]. Kathrynn Running, M.D.

## 2011-12-09 NOTE — Progress Notes (Signed)
Pt denies fatigue, loss of appetite, bowel issues, dysuria, urgency. He reports daytime urinary freq which is "his baseline", nocturia x 3-4. Daughter states Dr Laverle Patter took pt off Flomax because it was thought pt's nocturia due to drinking caffeine, alcoholic beverages late at night; pt instructed to decrease these fluids and stop drinking fluids earlier at night. She states bladder US showed no urine retention.

## 2011-12-14 ENCOUNTER — Encounter: Payer: Self-pay | Admitting: Radiation Oncology

## 2011-12-26 NOTE — Progress Notes (Signed)
  Radiation Oncology         [phone] ________________________________  Name: Derek Carr MRN: [id number]  Date: 12/14/2011  DOB: 1931/11/17  3-D Planning Note Prostate Brachytherapy  Diagnosis: 78 year old gentleman with stage T3a adenocarcinoma of the prostate with Gleason score 4+4 and PSA of 10.3 s/p 45 Gy external radiation followed by radioactive I-125 seeds for 110 Gy, boost therapy  Narrative: Derek Carr returned following prostate seed implantation for post implant planning. He underwent CT scan to delineate the three-dimensional structures of the pelvis and demonstrate the radiation distribution.  Results:   Prostate Coverage - The dose of radiation delivered to the 90% or more of the prostate gland (D90) was 108.10% of the prescription dose. This exceeds our goal of greater than 90%. Rectal Sparing - The volume of rectal tissue receiving the prescription dose or higher was 0.02 cc. This falls under our thresholds tolerance of 1.0 cc.  Impression: The prostate seed implant appears to show adequate target coverage and appropriate rectal sparing.  Plan:  The patient will continue to follow with urology for ongoing PSA determinations. I would anticipate a high likelihood for local tumor control with minimal risk for rectal morbidity.   [name] Kathrynn Running, M.D.

## 2012-05-22 ENCOUNTER — Encounter: Payer: Self-pay | Admitting: Radiation Oncology

## 2012-05-25 ENCOUNTER — Ambulatory Visit
Admission: RE | Admit: 2012-05-25 | Discharge: 2012-05-25 | Disposition: A | Discharge status: Home / Self Care | Payer: Medicare Other | Source: Ambulatory Visit | Attending: Radiation Oncology | Admitting: Radiation Oncology

## 2012-05-25 ENCOUNTER — Encounter: Payer: Self-pay | Admitting: Radiation Oncology

## 2012-05-25 VITALS — BP 133/75 | HR 76 | Temp 97.8°F | Resp 20 | Wt 180.5 lb

## 2012-05-25 DIAGNOSIS — C61 Malignant neoplasm of prostate: Secondary | ICD-10-CM

## 2012-05-25 HISTORY — DX: Personal history of irradiation: Z92.3

## 2012-05-25 NOTE — Progress Notes (Signed)
pateint here f/u s/p radiation 09/13/11-10/18/11  and seed implant prostate 11/15/11=52 seeds Alert orientred x3, ambulatory,, hass slight dysuria still when voiding, ha=s good stream though, nocturia 5-6x night, still on flomax daily , toviaz stopped and replaced withvesicare 10mg   Daily last Thursday , appetite good, no hematuria, no nausea, good energy level regular bowels, no c/o pain 8:59 AM   .

## 2012-05-25 NOTE — Progress Notes (Signed)
  Radiation Oncology         [phone] ________________________________  Name: Derek Carr MRN: [id number]  Date: 05/25/2012  DOB: 08-31-1931  Chart Note:  This patient's outside chest CT report from [facility]. The CT study report does mention the presence of bilateral axillary lymph nodes, most retained their fatty hilum. On my review, the adenopathy appears to be more pronounced on the right side. The CT report suggests clinical correlation.  On physical exam, the patient did have some fullness in the axillae without discrete adenopathy. I suspect ongoing surveillance including the upcoming followup chest CT in 6 months would be appropriate. However, in the term I will encourage the patient to monitor for the development of any changes.  ________________________________  [name] Kathrynn Running, M.D.

## 2012-05-25 NOTE — Progress Notes (Signed)
Radiation Oncology         [phone] ________________________________  Name: Derek Carr MRN: [id number]  Date: 05/25/2012  DOB: 09-06-1931  Follow-Up Visit Note  CC: Derek Kayser, MD  Derek Kayser, MD  Diagnosis:   79 year old gentleman with stage T3a adenocarcinoma of the prostate with Gleason score 4+4 and PSA of 10.3 s/p 45 Gy external radiation followed by radioactive I-125 seeds for 110 Gy, boost therapy  Interval Since Last Radiation:  5  months  Narrative:  The patient returns today for routine follow-up. He is complaining of increased urinary frequency and urinary hesitation symptoms. He indicates continued increased frequency and nocturia x 4. He denies any bowel symptoms.  He had low dose chest CT on 4/28 at [facility] and brought the CT, but not the report today.                              ALLERGIES:  has No Known Allergies.  Meds: Current Outpatient Prescriptions  Medication Sig Dispense Refill  . alendronate (FOSAMAX) 70 MG tablet Take 70 mg by mouth every 7 (seven) days. Take with a full glass of water on an empty stomach.      . bicalutamide (CASODEX) 50 MG tablet Take 50 mg by mouth daily.      . cholecalciferol (VITAMIN D) 1000 UNITS tablet Take 1,000 Units by mouth daily.      . Cream Base (VANIBASE) CREA Apply topically daily.      . folic acid (FOLVITE) 1 MG tablet Take 1 mg by mouth daily.      Marland Kitchen leuprolide (LUPRON) 30 MG injection Inject 30 mg into the muscle every 4 (four) months. Last given 03/23/12      . moxifloxacin (VIGAMOX) 0.5 % ophthalmic solution 1 drop 3 (three) times daily.      . pravastatin (PRAVACHOL) 80 MG tablet Take 80 mg by mouth every evening.       . solifenacin (VESICARE) 10 MG tablet Take by mouth daily.      . tamsulosin (FLOMAX) 0.4 MG CAPS Take by mouth daily.       No current facility-administered medications for this encounter.    Physical Findings: The patient is in no acute distress. Patient is alert and  oriented.  weight is 180 lb 8 oz (81.874 kg). His oral temperature is 97.8 F (36.6 C). His blood pressure is 133/75 and his pulse is 76. His respiration is 20. .  The axillae are free of discrete lymphadenopathy. There is some indistinct fullness in the right axilla. No significant changes.  Lab Findings: Lab Results  Component Value Date   WBC 3.6* 11/08/2011   HGB 12.1* 11/08/2011   HCT 36.8* 11/08/2011   MCV 82.5 11/08/2011   PLT 214 11/08/2011   Radiographic Findings: I reviewed his recent CT without the benefit of the report.  By verbal report, the daughter mentioned lung nodules which need follow-up chest CT in 6 months.  Based on that, I was able to identify 3 small nodules in the right lung measuring between 2 and 3 mm in size. These were noncalcified. In addition, the patient appears to have some enlargement of right axillary lymph nodes. Since the daughter does not recall this finding, I have requested a copy of the CT study report to review of the right sided axillary lymph nodes appear to be reactive in nature.  Impression:  The patient is recovering  from the effects of radiation.  He is continuing to suffer with urinary symptoms from his prostate seed implant.  Plan:  Today, talked patient about followup after prostate brachytherapy and we discussed some of the ongoing urinary toxicities it appears to be suffering with. We discussed the range of potential interventional options from medication therapy such as the Vesicare and Flomax that he is currently using, up through interventional techniques including the urethral cocktail employed at [facility] in [place] (outlined below) and possible channel TURP in severe cases. At this point, the patient's comfortable pursuing medication management and is also agreed to consider taking one TUMS pill nightly to help alkalinize his urine.  With regard to his recent chest CT, I think a followup chest CT in 6 months sounds quite  reasonable with respect to his lung nodules. I do plan to review his CT report once it is available to me to assess whether his right axillary lymph nodes appeared abnormal and whether any particular followup was suggested for those. If they were not noted initially, I wonder if a chest CT with contrast might help better delineate the number and size of enlarged lymph nodes in the right axilla.  _____________________________________  [name]. Kathrynn Running, M.D.     Reference: Treatment regimen provided by Carolan Shiver, M.D. from [facility] in [place] where external radiation followed by prostate seed implant has been largely pioneered.  Urethritis and Cystitis Treatment   2% lidocaine jelly 1 amp sodium bicarb 100 mg solucortef (cortisone) For patients with cystitis add the DMSO and 10,000u Heparin Mix contents and inject per meatus, slowly, then clamp for 45-60" Repeat weekly till symptoms gone (6-18 treatments) Additional measures Low acid diet and oral alkanalizing agents (tums)

## 2012-05-31 ENCOUNTER — Telehealth: Payer: Self-pay | Admitting: Radiation Oncology

## 2012-05-31 NOTE — Telephone Encounter (Signed)
Received call from Weldon Picking, wife, requesting Dr. Kathrynn Running review her husband's 05/22/2012 CT scan (from [facility] that can be viewed under the media tab once restrictions are removed). Also, she would like for Dr. Kathrynn Running or his staff to call her back and inform her if there is any further action required based on this scan. Routed this request to Dr. Kathrynn Running. Will call patient back with Dr. Broadus John directions.

## 2012-05-31 NOTE — Telephone Encounter (Signed)
  Radiation Oncology         [phone] ________________________________  Name: Derek Carr MRN: [id number]  Date: 05/31/2012  DOB: Jun 09, 1931  Telephone contact:  I reviewed the CT report for this patient which does describe the presence of bilateral axillary lymph nodes. The study report indicates that the appear to be benign since most of the lymph nodes were obtained there fatty hilum architecture. I telephoned the patient's daughter Zella Ball today to share these findings at this point would support routine followup chest CT in 6 months as originally scheduled. ________________________________  [name] Kathrynn Running, M.D.

## 2012-06-15 ENCOUNTER — Other Ambulatory Visit (HOSPITAL_COMMUNITY): Payer: Self-pay | Admitting: Internal Medicine

## 2012-06-15 ENCOUNTER — Ambulatory Visit (HOSPITAL_COMMUNITY)
Admission: RE | Admit: 2012-06-15 | Discharge: 2012-06-15 | Disposition: A | Discharge status: Home / Self Care | Payer: Medicare Other | Source: Ambulatory Visit | Attending: Internal Medicine | Admitting: Internal Medicine

## 2012-06-15 ENCOUNTER — Encounter (HOSPITAL_COMMUNITY): Payer: Self-pay

## 2012-06-15 DIAGNOSIS — C61 Malignant neoplasm of prostate: Secondary | ICD-10-CM | POA: Insufficient documentation

## 2012-06-15 MED ORDER — COSYNTROPIN 0.25 MG IJ SOLR
0.2500 mg | Freq: Once | INTRAMUSCULAR | Status: AC
  Administered 2012-06-15: 0.25 mg via INTRAVENOUS
  Filled 2012-06-15: qty 0.25

## 2012-06-15 MED ORDER — SODIUM CHLORIDE 0.9 % IJ SOLN
10.0000 mL | INTRAMUSCULAR | Status: DC | PRN
  Administered 2012-06-15: 10 mL via INTRAVENOUS

## 2012-06-15 NOTE — Progress Notes (Signed)
Patient here for Cortrosyn simulation test. Patient has been NPO since last evening. Labs drawn and med given per order. Patient tolerating procedure well.

## 2012-06-17 LAB — ACTH STIMULATION, 3 TIME POINTS
Cortisol, 30 Min: 29.2 ug/dL (ref >20)
Cortisol, Base: 15.1 ug/dL

## 2012-09-09 ENCOUNTER — Emergency Department (HOSPITAL_COMMUNITY): Payer: Medicare Other

## 2012-09-09 ENCOUNTER — Emergency Department (HOSPITAL_COMMUNITY)
Admission: EM | Admit: 2012-09-09 | Discharge: 2012-09-09 | Disposition: A | Discharge status: Home / Self Care | Payer: Medicare Other | Attending: Emergency Medicine | Admitting: Emergency Medicine

## 2012-09-09 ENCOUNTER — Encounter (HOSPITAL_COMMUNITY): Payer: Self-pay | Admitting: Emergency Medicine

## 2012-09-09 DIAGNOSIS — Z87891 Personal history of nicotine dependence: Secondary | ICD-10-CM | POA: Insufficient documentation

## 2012-09-09 DIAGNOSIS — Z79899 Other long term (current) drug therapy: Secondary | ICD-10-CM | POA: Insufficient documentation

## 2012-09-09 DIAGNOSIS — Z923 Personal history of irradiation: Secondary | ICD-10-CM | POA: Insufficient documentation

## 2012-09-09 DIAGNOSIS — Z8546 Personal history of malignant neoplasm of prostate: Secondary | ICD-10-CM | POA: Insufficient documentation

## 2012-09-09 DIAGNOSIS — R42 Dizziness and giddiness: Secondary | ICD-10-CM | POA: Insufficient documentation

## 2012-09-09 DIAGNOSIS — Z77090 Contact with and (suspected) exposure to asbestos: Secondary | ICD-10-CM | POA: Insufficient documentation

## 2012-09-09 DIAGNOSIS — Z9889 Other specified postprocedural states: Secondary | ICD-10-CM | POA: Insufficient documentation

## 2012-09-09 DIAGNOSIS — E785 Hyperlipidemia, unspecified: Secondary | ICD-10-CM | POA: Insufficient documentation

## 2012-09-09 DIAGNOSIS — Z87448 Personal history of other diseases of urinary system: Secondary | ICD-10-CM | POA: Insufficient documentation

## 2012-09-09 DIAGNOSIS — Z8709 Personal history of other diseases of the respiratory system: Secondary | ICD-10-CM | POA: Insufficient documentation

## 2012-09-09 DIAGNOSIS — C61 Malignant neoplasm of prostate: Secondary | ICD-10-CM

## 2012-09-09 DIAGNOSIS — Z8739 Personal history of other diseases of the musculoskeletal system and connective tissue: Secondary | ICD-10-CM | POA: Insufficient documentation

## 2012-09-09 DIAGNOSIS — Z8619 Personal history of other infectious and parasitic diseases: Secondary | ICD-10-CM | POA: Insufficient documentation

## 2012-09-09 DIAGNOSIS — I10 Essential (primary) hypertension: Secondary | ICD-10-CM | POA: Insufficient documentation

## 2012-09-09 LAB — CBC WITH DIFFERENTIAL/PLATELET
Basophils Absolute: 0 10*3/uL (ref 0.0–0.1)
Basophils Relative: 0 % (ref 0–1)
MCHC: 32.5 g/dL (ref 30.0–36.0)
Neutro Abs: 1.6 10*3/uL — ABNORMAL LOW (ref 1.7–7.7)
Neutrophils Relative %: 55 % (ref 43–77)
RDW: 14.6 % (ref 11.5–15.5)

## 2012-09-09 LAB — URINALYSIS, ROUTINE W REFLEX MICROSCOPIC
Ketones, ur: NEGATIVE mg/dL
Leukocytes, UA: NEGATIVE
Nitrite: NEGATIVE
Specific Gravity, Urine: 1.025 (ref 1.005–1.030)
Urobilinogen, UA: 0.2 mg/dL (ref 0.0–1.0)
pH: 5 (ref 5.0–8.0)

## 2012-09-09 LAB — POCT I-STAT TROPONIN I: Troponin i, poc: 0.01 ng/mL (ref 0.00–0.08)

## 2012-09-09 LAB — BASIC METABOLIC PANEL
Chloride: 108 mEq/L (ref 96–112)
Creatinine, Ser: 1.36 mg/dL — ABNORMAL HIGH (ref 0.50–1.35)
GFR calc Af Amer: 56 mL/min — ABNORMAL LOW (ref >90)
GFR calc non Af Amer: 48 mL/min — ABNORMAL LOW (ref >90)
Potassium: 3.5 mEq/L (ref 3.5–5.1)

## 2012-09-09 MED ORDER — IOHEXOL 350 MG/ML SOLN
100.0000 mL | Freq: Once | INTRAVENOUS | Status: AC | PRN
  Administered 2012-09-09: 100 mL via INTRAVENOUS

## 2012-09-09 NOTE — ED Provider Notes (Signed)
CSN: [id number]     Arrival date & time 09/09/12  1649 History     First MD Initiated Contact with Patient 09/09/12 1738     Chief Complaint  Patient presents with  . Dizziness   (Consider location/radiation/quality/duration/timing/severity/associated sxs/prior Treatment) HPI Comments: Patient is a 79 year old male with a past medical history of current prostate adenocarcinoma, hypertension, hip osteoarthritis who presents with a 2 month history of dizziness. Symptoms started gradually when he and his wife were on a road trip to [place] when he had a syncopal episode. Since then, the patient states he has had constant dizziness that is characterized as a "lightheaded-ness." Patient states the dizziness becomes worse when he is walking. The sensation is NOT affected by head movement, moving from a laying-to-sitting or sitting-to-standing position. He has tried wearing his old glasses to alleviate the symptoms which provide no relief. He does reports recent car trips to [place], [place], and [place] in the past 2 months. Patient has been monitored by his PCP for BP changes recently and has changed his hypertensive medication recently. Patient states changing the medication did not help. Patient denies fevers, chest pain, headache, nausea, abdominal pain or any other associated symptoms.    Past Medical History  Diagnosis Date  . Hyperlipidemia   . Hypertension   . Erectile dysfunction   . Prostate cancer 05/11/2011    bx=Adenocarcinoma,gleason3+4=7, 4+4=8,PSA=10.30volume=45.7cc  . History of asbestos exposure   . Nocturia   . History of shingles 2012-- BILATERAL EYES--  NO RESIDUAL  . Dermatitis, atopic ARMS AND LEGS  . OA (osteoarthritis) of hip RIGHT  . Arthritis   . Frequency of urination   . Smokers' cough   . Lumbar scoliosis   . History of radiation therapy 8/19-13-10/18/11    prostate, 47 GY   Past Surgical History  Procedure Laterality Date  . Esophageus cyst removal  YRS  AGO  . Umbilical hernia repair  1998    epigastric  . Prostate biopsy  05/11/11    Adenocarcinoma (MD OFFICE)  . Cardiac catheterization  02-24-2009  DR NASHER    MINOR CORONARY ARTERY IRREGULARITIES/ NORMAL LVSF/ EF 65-70%  . Attempted left vats/ left thoracotomy/ resection of the enormous, probable bronchogenic cyst  01-04-2005  DR Edwyna Shell  . Repair right inguinal hernia w/ mesh  09-10-1999  . Lumbar spine surgery  1991  . Radioactive seed implant  11/15/2011    Procedure: RADIOACTIVE SEED IMPLANT;  Surgeon: Crecencio Mc, MD;  Location: [facility];  Service: Urology;  Laterality: N/A;  Total number of seeds - 52  . Cystoscopy  11/15/2011    Procedure: CYSTOSCOPY;  Surgeon: Crecencio Mc, MD;  Location: [facility];  Service: Urology;  Laterality: N/A;  no seeds seen in bladder   Family History  Problem Relation Age of Onset  . Hypertension Mother   . Prostate cancer Brother     surgery/cured  . Lung cancer Brother     new primary/same brother   History  Substance Use Topics  . Smoking status: Former Smoker -- 0.10 packs/day for 30 years    Types: Cigarettes  . Smokeless tobacco: Never Used     Comment: HAS CUT DOWN SMOKING FROM 1PPD TO 1PPWK  . Alcohol Use: 0.6 oz/week    1 Cans of beer per week    Review of Systems  Neurological: Positive for dizziness and light-headedness.  All other systems reviewed and are negative.    Allergies  Review of  patient's allergies indicates no known allergies.  Home Medications   Current Outpatient Rx  Name  Route  Sig  Dispense  Refill  . alendronate (FOSAMAX) 70 MG tablet   Oral   Take 70 mg by mouth every 7 (seven) days. Take with a full glass of water on an empty stomach.         . bicalutamide (CASODEX) 50 MG tablet   Oral   Take 50 mg by mouth daily.         . cholecalciferol (VITAMIN D) 1000 UNITS tablet   Oral   Take 1,000 Units by mouth daily.         . Cream Base (VANIBASE) CREA    Apply externally   Apply topically daily.         . folic acid (FOLVITE) 1 MG tablet   Oral   Take 1 mg by mouth daily.         Marland Kitchen leuprolide (LUPRON) 30 MG injection   Intramuscular   Inject 30 mg into the muscle every 4 (four) months. Last given 03/23/12         . moxifloxacin (VIGAMOX) 0.5 % ophthalmic solution      1 drop 3 (three) times daily.         . pravastatin (PRAVACHOL) 80 MG tablet   Oral   Take 80 mg by mouth every evening.          . solifenacin (VESICARE) 10 MG tablet   Oral   Take by mouth daily.         . tamsulosin (FLOMAX) 0.4 MG CAPS   Oral   Take by mouth daily.          BP 109/77  Pulse 75  Temp(Src) 97.8 F (36.6 C) (Oral)  Resp 19  SpO2 100% Physical Exam  Nursing note and vitals reviewed. Constitutional: He is oriented to person, place, and time. He appears well-developed and well-nourished. No distress.  HENT:  Head: Normocephalic and atraumatic.  Mouth/Throat: Oropharynx is clear and moist. No oropharyngeal exudate.  Eyes: Conjunctivae and EOM are normal. Pupils are equal, round, and reactive to light. No scleral icterus.  Neck: Normal range of motion.  Cardiovascular: Normal rate and regular rhythm.  Exam reveals no gallop and no friction rub.   No murmur heard. Pulmonary/Chest: Effort normal and breath sounds normal. He has no wheezes. He has no rales. He exhibits no tenderness.  Abdominal: Soft. He exhibits no distension. There is no tenderness. There is no rebound and no guarding.  Musculoskeletal: Normal range of motion.  No calf tenderness to palpation.   Neurological: He is alert and oriented to person, place, and time. No cranial nerve deficit. Coordination normal.  Extremity strength and sensation equal and intact bilaterally. Speech is goal-oriented. Moves limbs without ataxia.   Skin: Skin is warm and dry.  Psychiatric: He has a normal mood and affect. His behavior is normal.    ED Course   Procedures  (including critical care time)  Labs Reviewed  CBC WITH DIFFERENTIAL - Abnormal; Notable for the following:    WBC 2.9 (*)    Neutro Abs 1.6 (*)    Eosinophils Relative 7 (*)    All other components within normal limits  BASIC METABOLIC PANEL - Abnormal; Notable for the following:    Glucose, Bld 120 (*)    Creatinine, Ser 1.36 (*)    GFR calc non Af Amer 48 (*)    GFR calc Af  Amer 56 (*)    All other components within normal limits  URINE CULTURE  URINALYSIS, ROUTINE W REFLEX MICROSCOPIC  POCT I-STAT TROPONIN I   Ct Head Wo Contrast  09/09/2012   *RADIOLOGY REPORT*  Clinical Data: 79 year old male with episodes of dizziness. Hypertension.  CT HEAD WITHOUT CONTRAST  Technique:  Contiguous axial images were obtained from the base of the skull through the vertex without contrast.  Comparison: 07/25/2005.  Findings: Mild bubbly opacity left sphenoid sinus.  Scattered opacification of the inferior right mastoid air cells.  No mastoid coalescent.  Other Visualized paranasal sinuses and mastoids are clear.  No acute osseous abnormality identified.  Visualized orbits and scalp soft tissues are within normal limits. Calcified atherosclerosis at the skull base.  Negative visualized nasopharynx.  Cerebral volume is within normal limits for age.  No midline shift, ventriculomegaly, mass effect, evidence of mass lesion, intracranial hemorrhage or evidence of cortically based acute infarction.  Gray-white matter differentiation is within normal limits throughout the brain.  No suspicious intracranial vascular hyperdensity.  IMPRESSION: 1.  Normal for age noncontrast CT appearance of the brain. 2.  Mild right mastoid effusion.  Mild inflammatory changes left sphenoid sinus.   Original Report Authenticated By: Erskine Speed, M.D.   Ct Angio Chest Pe W/cm &/or Wo Cm  09/09/2012   *RADIOLOGY REPORT*  Clinical Data: Weakness, dizziness during walking, possible pulmonary embolus.  CT ANGIOGRAPHY CHEST  Technique:   Multidetector CT imaging of the chest using the standard protocol during bolus administration of intravenous contrast. Multiplanar reconstructed images including MIPs were obtained and reviewed to evaluate the vascular anatomy.  Contrast: OMNIPAQUE IOHEXOL 350 MG/ML SOLN  Comparison: None.  Findings: Sagittal images of the spine shows mild degenerative changes. There is mild spinal canal stenosis at T8-T9 level due to mild posterior spurring.  Images of the images of the thoracic inlet are unremarkable. Central airways are patent.  Heart size within normal limits. Trace anterior pericardial effusion.  Visualized upper abdomen is unremarkable.  The study is of excellent technical quality.  No pulmonary embolus is noted.  Subcarinal lymph node measures 2.4 x 1.2 cm.  Atherosclerotic calcifications of thoracic aorta.  A right axillary lymph node measures 2.4 x 1.3 cm.  Images of the lung parenchyma shows no acute infiltrate or pulmonary edema.  There is mild dependent atelectasis bilateral lower lobe posteriorly.  Mild scarring noted lung bases posteriorly. A right hilar lymph node measures 1.3 cm.  Left hilar lymph node measures 1.4 cm.  No aortic aneurysm.  IMPRESSION:  1.  No pulmonary embolus is noted. 2.  No acute infiltrate or pulmonary edema. 3.  Mild degenerative changes thoracic spine. 4.  Mild enlarged hilar, mediastinal and right axillary lymph nodes.  Although this may be reactive in nature lymphoproliferative disease cannot be excluded.  Clinical correlation is necessary. Follow-up examination in 3 months is suggested to assure stability or resolution.   Original Report Authenticated By: Natasha Mead, M.D.   1. Vertigo     MDM  5:41 PM Basic labs, urinalysis and head CT pending. Vitals stable and patient afebrile.   11:10 PM Labs unremarkable for acute change. Head CT unremarkable for acute change. Chest CT unremarkable for PE. I spoke with Dr. Thad Ranger, Neurology, about the patient who states  the patient is OK to go home with outpatient MRI since symptoms have been going on for 2 months. Dr. Conley Rolls Hospitalist saw the patient and thinks he will not benefit from admission.  Vitals stable and patient afebrile. Patient will have outpatient follow up with instructions to return to the ED with worsening or concerning symptoms.     Emilia Beck, PA-C 09/09/12 2342

## 2012-09-09 NOTE — Consult Note (Signed)
[facility] History and Physical  Derek Carr OZH:[id number] DOB: [birth year]    PCP:   Ezequiel Kayser, MD   Chief Complaint: Dizziness.  HPI: Derek Carr is an 79 y.o. male with hx of prostate cancer s/p IRRx thx, HTN, hyperlipidemia, OA, presents to the ER with "lightheadedness".  He described it as feeling "imbalanced", agreed that it feels more like getting off a boat, or a roller coaster or getting a little drunk, than feeling real weak and about to faint.  He has some of these symptoms with head movement.  He has no slurred speech, focal weakness, and symptoms lasted less than 30 minutes.  In the ER, he has an episode where his HR was in the 50's, but it responded to 80's when he stood up.  He actually had the first episode around 2 months ago, when he fell, but he never loss consciousness.  Work up in the ER included an EKG with NSR, head CT was negative, and labs were unremarkable but with Cr of 1.4.  Hospitalist was consulted regarding admission for lightheadedness with transcient HR of low 50's.  He had 2 EKGs today, one with HR 70's, and another with HR 50's.    Rewiew of Systems:  Constitutional: Negative for malaise, fever and chills. No significant weight loss or weight gain Eyes: Negative for eye pain, redness and discharge, diplopia, visual changes, or flashes of light. ENMT: Negative for ear pain, hoarseness, nasal congestion, sinus pressure and sore throat. No headaches; tinnitus, drooling, or problem swallowing. Cardiovascular: Negative for chest pain, palpitations, diaphoresis, dyspnea and peripheral edema. ; No orthopnea, PND Respiratory: Negative for cough, hemoptysis, wheezing and stridor. No pleuritic chestpain. Gastrointestinal: Negative for nausea, vomiting, diarrhea, constipation, abdominal pain, melena, blood in stool, hematemesis, jaundice and rectal bleeding.    Genitourinary: Negative for frequency, dysuria, incontinence,flank pain and  hematuria; Musculoskeletal: Negative for back pain and neck pain. Negative for swelling and trauma.;  Skin: . Negative for pruritus, rash, abrasions, bruising and skin lesion.; ulcerations Neuro: Negative for headache, lightheadedness and neck stiffness. Negative for weakness, altered level of consciousness , altered mental status, extremity weakness, burning feet, involuntary movement, seizure and syncope.  Psych: negative for anxiety, depression, insomnia, tearfulness, panic attacks, hallucinations, paranoia, suicidal or homicidal ideation    Past Medical History  Diagnosis Date  . Hyperlipidemia   . Hypertension   . Erectile dysfunction   . Prostate cancer 05/11/2011    bx=Adenocarcinoma,gleason3+4=7, 4+4=8,PSA=10.30volume=45.7cc  . History of asbestos exposure   . Nocturia   . History of shingles 2012-- BILATERAL EYES--  NO RESIDUAL  . Dermatitis, atopic ARMS AND LEGS  . OA (osteoarthritis) of hip RIGHT  . Arthritis   . Frequency of urination   . Smokers' cough   . Lumbar scoliosis   . History of radiation therapy 8/19-13-10/18/11    prostate, 47 GY    Past Surgical History  Procedure Laterality Date  . Esophageus cyst removal  YRS AGO  . Umbilical hernia repair  1998    epigastric  . Prostate biopsy  05/11/11    Adenocarcinoma (MD OFFICE)  . Cardiac catheterization  02-24-2009  DR NASHER    MINOR CORONARY ARTERY IRREGULARITIES/ NORMAL LVSF/ EF 65-70%  . Attempted left vats/ left thoracotomy/ resection of the enormous, probable bronchogenic cyst  01-04-2005  DR Edwyna Shell  . Repair right inguinal hernia w/ mesh  09-10-1999  . Lumbar spine surgery  1991  . Radioactive seed implant  11/15/2011    Procedure:  RADIOACTIVE SEED IMPLANT;  Surgeon: Crecencio Mc, MD;  Location: [facility];  Service: Urology;  Laterality: N/A;  Total number of seeds - 52  . Cystoscopy  11/15/2011    Procedure: CYSTOSCOPY;  Surgeon: Crecencio Mc, MD;  Location: [facility];   Service: Urology;  Laterality: N/A;  no seeds seen in bladder    Medications:  HOME MEDS: Prior to Admission medications   Medication Sig Start Date End Date Taking? Authorizing Provider  aspirin 81 MG tablet Take 81 mg by mouth daily.   Yes Historical Provider, MD  bicalutamide (CASODEX) 50 MG tablet Take 50 mg by mouth daily.   Yes Historical Provider, MD  cholecalciferol (VITAMIN D) 1000 UNITS tablet Take 1,000 Units by mouth daily.   Yes Historical Provider, MD  Cyanocobalamin (VITAMIN B 12 PO) Take 1 tablet by mouth daily.   Yes Historical Provider, MD  ezetimibe (ZETIA) 10 MG tablet Take 10 mg by mouth daily.   Yes Historical Provider, MD  folic acid (FOLVITE) 1 MG tablet Take 1 mg by mouth daily.   Yes Historical Provider, MD  leuprolide (LUPRON) 30 MG injection Inject 30 mg into the muscle every 4 (four) months. Last given July 2014   Yes Historical Provider, MD  moxifloxacin (VIGAMOX) 0.5 % ophthalmic solution 1 drop 3 (three) times daily.   Yes Historical Provider, MD  nicotine (NICODERM CQ - DOSED IN MG/24 HOURS) 21 mg/24hr patch Place 1 patch onto the skin daily.   Yes Historical Provider, MD  pravastatin (PRAVACHOL) 80 MG tablet Take 80 mg by mouth every evening.  08/17/10  Yes Historical Provider, MD  silodosin (RAPAFLO) 8 MG CAPS capsule Take 8 mg by mouth daily.   Yes Historical Provider, MD  solifenacin (VESICARE) 10 MG tablet Take by mouth daily. 05/18/12  Yes Historical Provider, MD  alendronate (FOSAMAX) 70 MG tablet Take 70 mg by mouth every 7 (seven) days. Take with a full glass of water on an empty stomach.    Historical Provider, MD  Cream Base (VANIBASE) CREA Apply topically daily.    Historical Provider, MD     Allergies:  No Known Allergies  Social History:   reports that he has quit smoking. His smoking use included Cigarettes. He has a 3 pack-year smoking history. He has never used smokeless tobacco. He reports that he drinks about 0.6 ounces of alcohol per  week. He reports that he does not use illicit drugs.  Family History: Family History  Problem Relation Age of Onset  . Hypertension Mother   . Prostate cancer Brother     surgery/cured  . Lung cancer Brother     new primary/same brother     Physical Exam: Filed Vitals:   09/09/12 2030 09/09/12 2100 09/09/12 2113 09/09/12 2130  BP: 155/86 159/86 159/86 155/86  Pulse: 48 48 71 63  Temp:   98.2 F (36.8 C)   TempSrc:   Oral   Resp: 19 15 21 16   SpO2: 99% 98% 97% 98%   Blood pressure 155/86, pulse 63, temperature 98.2 F (36.8 C), temperature source Oral, resp. rate 16, SpO2 98.00%.  GEN:  Pleasant  patient lying in the stretcher in no acute distress; cooperative with exam. PSYCH:  alert and oriented x4; does not appear anxious or depressed; affect is appropriate. HEENT: Mucous membranes pink and anicteric; PERRLA; EOM intact; no cervical lymphadenopathy nor thyromegaly or carotid bruit; no JVD; There were no stridor. Neck is very supple. Breasts:: Not examined  CHEST WALL: No tenderness CHEST: Normal respiration, clear to auscultation bilaterally.  HEART: Regular rate and rhythm.  There are no murmur, rub, or gallops.   BACK: No kyphosis or scoliosis; no CVA tenderness ABDOMEN: soft and non-tender; no masses, no organomegaly, normal abdominal bowel sounds; no pannus; no intertriginous candida. There is no rebound and no distention. Rectal Exam: Not done EXTREMITIES: No bone or joint deformity; age-appropriate arthropathy of the hands and knees; no edema; no ulcerations.  There is no calf tenderness. Genitalia: not examined PULSES: 2+ and symmetric SKIN: Normal hydration no rash or ulceration CNS: Cranial nerves 2-12 grossly intact no focal lateralizing neurologic deficit.  Speech is fluent; uvula elevated with phonation, facial symmetry and tongue midline. DTR are normal bilaterally, cerebella exam is intact, barbinski is negative and strengths are equaled bilaterally.  No  sensory loss.  Rhomberg is negative.   Labs on Admission:  Basic Metabolic Panel:  Recent Labs Lab 09/09/12 1800  NA 141  K 3.5  CL 108  CO2 24  GLUCOSE 120*  BUN 19  CREATININE 1.36*  CALCIUM 9.9   Liver Function Tests: No results found for this basename: AST, ALT, ALKPHOS, BILITOT, PROT, ALBUMIN,  in the last 168 hours No results found for this basename: LIPASE, AMYLASE,  in the last 168 hours No results found for this basename: AMMONIA,  in the last 168 hours CBC:  Recent Labs Lab 09/09/12 1800  WBC 2.9*  NEUTROABS 1.6*  HGB 13.0  HCT 40.0  MCV 82.6  PLT 213   Cardiac Enzymes: No results found for this basename: CKTOTAL, CKMB, CKMBINDEX, TROPONINI,  in the last 168 hours  CBG: No results found for this basename: GLUCAP,  in the last 168 hours   Radiological Exams on Admission: Ct Head Wo Contrast  09/09/2012   *RADIOLOGY REPORT*  Clinical Data: 79 year old male with episodes of dizziness. Hypertension.  CT HEAD WITHOUT CONTRAST  Technique:  Contiguous axial images were obtained from the base of the skull through the vertex without contrast.  Comparison: 07/25/2005.  Findings: Mild bubbly opacity left sphenoid sinus.  Scattered opacification of the inferior right mastoid air cells.  No mastoid coalescent.  Other Visualized paranasal sinuses and mastoids are clear.  No acute osseous abnormality identified.  Visualized orbits and scalp soft tissues are within normal limits. Calcified atherosclerosis at the skull base.  Negative visualized nasopharynx.  Cerebral volume is within normal limits for age.  No midline shift, ventriculomegaly, mass effect, evidence of mass lesion, intracranial hemorrhage or evidence of cortically based acute infarction.  Gray-white matter differentiation is within normal limits throughout the brain.  No suspicious intracranial vascular hyperdensity.  IMPRESSION: 1.  Normal for age noncontrast CT appearance of the brain. 2.  Mild right mastoid  effusion.  Mild inflammatory changes left sphenoid sinus.   Original Report Authenticated By: Erskine Speed, M.D.   Ct Angio Chest Pe W/cm &/or Wo Cm  09/09/2012   *RADIOLOGY REPORT*  Clinical Data: Weakness, dizziness during walking, possible pulmonary embolus.  CT ANGIOGRAPHY CHEST  Technique:  Multidetector CT imaging of the chest using the standard protocol during bolus administration of intravenous contrast. Multiplanar reconstructed images including MIPs were obtained and reviewed to evaluate the vascular anatomy.  Contrast: OMNIPAQUE IOHEXOL 350 MG/ML SOLN  Comparison: None.  Findings: Sagittal images of the spine shows mild degenerative changes. There is mild spinal canal stenosis at T8-T9 level due to mild posterior spurring.  Images of the images of the thoracic inlet  are unremarkable. Central airways are patent.  Heart size within normal limits. Trace anterior pericardial effusion.  Visualized upper abdomen is unremarkable.  The study is of excellent technical quality.  No pulmonary embolus is noted.  Subcarinal lymph node measures 2.4 x 1.2 cm.  Atherosclerotic calcifications of thoracic aorta.  A right axillary lymph node measures 2.4 x 1.3 cm.  Images of the lung parenchyma shows no acute infiltrate or pulmonary edema.  There is mild dependent atelectasis bilateral lower lobe posteriorly.  Mild scarring noted lung bases posteriorly. A right hilar lymph node measures 1.3 cm.  Left hilar lymph node measures 1.4 cm.  No aortic aneurysm.  IMPRESSION:  1.  No pulmonary embolus is noted. 2.  No acute infiltrate or pulmonary edema. 3.  Mild degenerative changes thoracic spine. 4.  Mild enlarged hilar, mediastinal and right axillary lymph nodes.  Although this may be reactive in nature lymphoproliferative disease cannot be excluded.  Clinical correlation is necessary. Follow-up examination in 3 months is suggested to assure stability or resolution.   Original Report Authenticated By: Natasha Mead, M.D.     EKG: Independently reviewed. SB at 50's, no acute ST T changes. Another EKG with SR at 70's with no acute ST-T changes.   Assessment/Plan  I think he described his symptoms more of a vertiginous feeling, mild, rather than orthostatic symptomology.  He tested negative for orthostasis changes.  He also had mild bradycardia, but his HR increased to 70's with any movement.  In any case, I don't think the dizziness if "lightheadedness" as in presyncope, but rather vertigo.  I think he has peripheral vertigo and it has been two separate episodes, now and 2 months ago.  I don't think he needs to be admitted, but if his symptoms persisted, I would obtain either CT with constrast of the head, or an MRI of the brain (better) to exclude possibility of central vertigo.  This is less likely.  He has been on ASA and I recommended that he continues with it.  He should follow up with his PCP next week.  He doesn't think it is so bad he is going to fall.  I think it is reasonable to have him follow up with his PCP.  Strict criteria for returning urgently to the ER was discussed with him, wife, and daughter all at bedside.  Discussed with EDP also and we all concurred with this plan.  Thank you for asking me to participate in his care.  Other plans as per orders.  Code Status: FULL Unk Lightning, MD. [facility] Pager [phone] 7pm to 7am.  09/09/2012, 11:16 PM

## 2012-09-09 NOTE — ED Notes (Signed)
Pt presents with dizziness.  Pt has h/o orthostatic hypotension and is being followed by PCP for B/P med changes.

## 2012-09-09 NOTE — ED Notes (Signed)
Patient transported to CT 

## 2012-09-09 NOTE — ED Notes (Signed)
Pt returned from CT °

## 2012-09-09 NOTE — ED Notes (Signed)
Pt states that he feels fine when he is sitting and laying down but when he is standing he feels the dizziness and weakness in his legs when he walks.  Reports this constant feeling has been occuring since June.  Pt states that his PCP started changing his BP medications following the first "episode" of this complaint.  Pt in NAD, A&O.

## 2012-09-10 NOTE — ED Provider Notes (Signed)
Medical screening examination/treatment/procedure(s) were conducted as a shared visit with non-physician practitioner(s) and myself.  I personally evaluated the patient during the encounter   Discussed obs admission with patient and family. Hospitalist feels patient should be discharged with close f/u. As this is a recurrence I feel patient is safe for discharge with close PCP f/u. Currently asymptomatic while in ED.  Audree Camel, MD 09/10/12 1136

## 2012-09-11 LAB — URINE CULTURE

## 2012-10-10 ENCOUNTER — Ambulatory Visit: Payer: Medicare Other | Attending: Otolaryngology | Admitting: Physical Therapy

## 2012-10-10 DIAGNOSIS — IMO0001 Reserved for inherently not codable concepts without codable children: Secondary | ICD-10-CM | POA: Insufficient documentation

## 2012-10-10 DIAGNOSIS — R269 Unspecified abnormalities of gait and mobility: Secondary | ICD-10-CM | POA: Insufficient documentation

## 2012-10-10 DIAGNOSIS — R42 Dizziness and giddiness: Secondary | ICD-10-CM | POA: Insufficient documentation

## 2012-10-16 ENCOUNTER — Ambulatory Visit: Payer: Medicare Other | Admitting: Neurology

## 2012-10-17 ENCOUNTER — Ambulatory Visit: Payer: Medicare Other | Admitting: Physical Therapy

## 2012-10-24 ENCOUNTER — Encounter: Payer: Medicare Other | Admitting: Physical Therapy

## 2012-10-31 ENCOUNTER — Ambulatory Visit: Payer: Medicare Other | Attending: Otolaryngology | Admitting: Physical Therapy

## 2012-10-31 DIAGNOSIS — IMO0001 Reserved for inherently not codable concepts without codable children: Secondary | ICD-10-CM | POA: Insufficient documentation

## 2012-10-31 DIAGNOSIS — R269 Unspecified abnormalities of gait and mobility: Secondary | ICD-10-CM | POA: Insufficient documentation

## 2012-10-31 DIAGNOSIS — R42 Dizziness and giddiness: Secondary | ICD-10-CM | POA: Insufficient documentation

## 2012-11-07 ENCOUNTER — Ambulatory Visit: Payer: Medicare Other | Admitting: Physical Therapy

## 2012-11-14 ENCOUNTER — Encounter: Payer: Self-pay | Admitting: Neurology

## 2012-11-14 ENCOUNTER — Ambulatory Visit (INDEPENDENT_AMBULATORY_CARE_PROVIDER_SITE_OTHER): Payer: Medicare Other | Admitting: Neurology

## 2012-11-14 ENCOUNTER — Encounter (INDEPENDENT_AMBULATORY_CARE_PROVIDER_SITE_OTHER): Payer: Self-pay

## 2012-11-14 VITALS — BP 114/75 | HR 45 | Resp 16 | Ht 75.0 in | Wt 182.0 lb

## 2012-11-14 DIAGNOSIS — N39490 Overflow incontinence: Secondary | ICD-10-CM

## 2012-11-14 DIAGNOSIS — I951 Orthostatic hypotension: Secondary | ICD-10-CM

## 2012-11-14 DIAGNOSIS — Z8546 Personal history of malignant neoplasm of prostate: Secondary | ICD-10-CM

## 2012-11-14 NOTE — Progress Notes (Signed)
[facility]  Provider:  Melvyn Novas, M D  Referring Provider: Ezequiel Kayser, MD Primary Care Physician:  Ezequiel Kayser, MD  Chief Complaint  Patient presents with  . Imbalance    HPI:  Derek Carr is a 79 y.o. male  Is seen here as a referral/ revisit  from Dr. Waynard Edwards for orthostatic dizziness.  Derek Carr is here today after a 6 month history of balance and also stasis problems. He has been followed by Dr. Jenne Pane and Dr. Waynard Edwards he also has a urologist Dr. Heloise Purpura. The patient underwent an ear nose and throat evaluation on 09/26/2012, a head CT had revealed some right ear mass to its cyst, and his dizziness symptoms were something more like imbalance and not much is vertical. The ENT doctor from normal related findings for his speciality for followup on and came he recommended proceeding with vestibular rehabilitation and a neurologic evaluation.  The patient started with dizziness symptoms in April he fell early twice, he fell that he staggered when he got up from a seated position and he felt also that when he was leaning forward he seemed to have chased his point of gravity. There was no spinning sensation and he was not bothered when he was standing up without movements. He also had not felt any trouble when lying down or lying on the left or right side. The imbalance in always to be postural. Dr. Waynard Edwards, his internal medicine specialist change some of his blood pressure medications hoping that this would alleviate the symptoms. Amlodipine was the first medication to be reduced and also finally weaned off the but it did not lead to a greater stability for the patient. The VESIcare was investigated and started at 10 mg and replaced Flomax. Gala Murdoch was stopped after that. This was replaced with rub or thrill but then also was stopped. This happened about 2 months ago meanwhile over the last 8 weeks Derek Carr is balance and also static dizziness has improved. He  is back on VESIcare and still is tolerating this medicine along pretty well.  His daughter  Reports that her father , who used vesicare and other meds to control urination, is now up every hour with nocturia. He is slender, he has prostrate cancer and had radiation beam and seeds, and Lupron injections. He get's less sleep. He ha hot flushes and he is bothered by those. He is neither fatigued not in pain, but naps many times in daytime, his nocturnal sleep no longer being refreshing.  Sleep time at 10 PM and he will fall asleep promptly, he will rise at 8 AM, but only 6-6.5 hours are asleep.   Review of Systems: Out of a complete 14 system review, the patient complains of only the following symptoms, and all other reviewed systems are negative. orthostatic dizziness.    History   Social History  . Marital Status: Married    Spouse Name: N/A    Number of Children: N/A  . Years of Education: N/A   Occupational History  .  [employer]    Retired   Social History Main Topics  . Smoking status: Former Smoker -- 0.10 packs/day for 30 years    Types: Cigarettes  . Smokeless tobacco: Never Used     Comment: HAS CUT DOWN SMOKING FROM 1PPD TO 1PPWK  . Alcohol Use: 0.6 oz/week    1 Cans of beer per week  . Drug Use: No     Comment: quit  smoking 02/2012  . Sexual Activity: Not on file   Other Topics Concern  . Not on file   Social History Narrative   Married 56 years   3 daughters and 1 son   Retired from [employer]    Family History  Problem Relation Age of Onset  . Hypertension Mother   . Prostate cancer Brother     surgery/cured  . Lung cancer Brother     new primary/same brother    Past Medical History  Diagnosis Date  . Hyperlipidemia   . Hypertension   . Erectile dysfunction   . Prostate cancer 05/11/2011    bx=Adenocarcinoma,gleason3+4=7, 4+4=8,PSA=10.30volume=45.7cc  . History of asbestos exposure   . Nocturia   . History of shingles 2012-- BILATERAL EYES--   NO RESIDUAL  . Dermatitis, atopic ARMS AND LEGS  . OA (osteoarthritis) of hip RIGHT  . Arthritis   . Frequency of urination   . Smokers' cough   . Lumbar scoliosis   . History of radiation therapy 8/19-13-10/18/11    prostate, 47 GY    Past Surgical History  Procedure Laterality Date  . Esophageus cyst removal  YRS AGO  . Umbilical hernia repair  1998    epigastric  . Prostate biopsy  05/11/11    Adenocarcinoma (MD OFFICE)  . Cardiac catheterization  02-24-2009  DR NASHER    MINOR CORONARY ARTERY IRREGULARITIES/ NORMAL LVSF/ EF 65-70%  . Attempted left vats/ left thoracotomy/ resection of the enormous, probable bronchogenic cyst  01-04-2005  DR Edwyna Shell  . Repair right inguinal hernia w/ mesh  09-10-1999  . Lumbar spine surgery  1991  . Radioactive seed implant  11/15/2011    Procedure: RADIOACTIVE SEED IMPLANT;  Surgeon: Crecencio Mc, MD;  Location: [facility];  Service: Urology;  Laterality: N/A;  Total number of seeds - 52  . Cystoscopy  11/15/2011    Procedure: CYSTOSCOPY;  Surgeon: Crecencio Mc, MD;  Location: [facility];  Service: Urology;  Laterality: N/A;  no seeds seen in bladder    Current Outpatient Prescriptions  Medication Sig Dispense Refill  . alendronate (FOSAMAX) 70 MG tablet Take 70 mg by mouth every 7 (seven) days. Take with a full glass of water on an empty stomach.      Marland Kitchen aspirin 81 MG tablet Take 81 mg by mouth daily.      . bicalutamide (CASODEX) 50 MG tablet Take 50 mg by mouth daily.      . cholecalciferol (VITAMIN D) 1000 UNITS tablet Take 1,000 Units by mouth daily.      . Cream Base (VANIBASE) CREA Apply topically daily.      . Cyanocobalamin (VITAMIN B 12 PO) Take 1 tablet by mouth daily.      Marland Kitchen ezetimibe (ZETIA) 10 MG tablet Take 10 mg by mouth daily.      . folic acid (FOLVITE) 1 MG tablet Take 1 mg by mouth daily.      Marland Kitchen leuprolide (LUPRON) 30 MG injection Inject 30 mg into the muscle every 4 (four) months. Last given  July 2014      . Leuprolide Acetate (LUPRON IJ) Inject as directed. Q 4 Months      . moxifloxacin (VIGAMOX) 0.5 % ophthalmic solution 1 drop 3 (three) times daily.      . nicotine (NICODERM CQ - DOSED IN MG/24 HOURS) 21 mg/24hr patch Place 1 patch onto the skin daily.      . pravastatin (PRAVACHOL) 80 MG tablet Take 80  mg by mouth every evening.       . solifenacin (VESICARE) 10 MG tablet Take by mouth daily.       No current facility-administered medications for this visit.    Allergies as of 11/14/2012  . (No Known Allergies)    Vitals: BP 114/75  Pulse 45  Resp 16  Ht 6\' 3"  (1.905 m)  Wt 182 lb (82.555 kg)  BMI 22.75 kg/m2 Last Weight:  Wt Readings from Last 1 Encounters:  11/14/12 182 lb (82.555 kg)   Last Height:   Ht Readings from Last 1 Encounters:  11/14/12 6\' 3"  (1.905 m)    Physical exam:  General: The patient is awake, alert and appears not in acute distress. The patient is well groomed. Head: Normocephalic, atraumatic. Neck is supple. Mallampati 2 , neck circumference:13  Cardiovascular:  Regular rate and rhythm , without  murmurs , i found a milder left over right  carotid bruit,  No distended neck veins. Respiratory: Lungs are clear to auscultation. Skin:  Without evidence of edema, or rash Trunk: BMI is normal.  Neurologic exam : The patient is awake and alert, oriented to place and time.   Memory subjective described as intact. There is a normal attention span & concentration ability. Speech is fluent without  dysarthria, dysphonia or aphasia. Mood and affect are appropriate.  Cranial nerves: Pupils are equal and briskly reactive to light. Funduscopic exam without  evidence of pallor or edema. Extraocular movements  in vertical and horizontal planes intact and without nystagmus.  Visual fields by finger perimetry are intact. Hearing to finger rub intact.  Facial sensation intact to fine touch. Facial motor strength is symmetric and tongue and uvula move  midline.  Motor exam:    Normal tone and normal muscle bulk and symmetric normal strength in all extremities.  Sensory:  Fine touch, pinprick and vibration were tested in all extremities. Proprioception is normal.  Coordination: Rapid alternating movements in the fingers/hands is tested and normal. Finger-to-nose maneuver tested and normal without evidence of ataxia, dysmetria or tremor.  Gait and station: Patient walks without assistive device and is able and assisted stool climb up to the exam table. Strength within normal limits. Stance is stable and normal.  Steps are unfragmented. Romberg testing showed swaying , but he is not falling.   Deep tendon reflexes: in the  upper and lower extremities are symmetric and intact. Babinski maneuver response is  downgoing.   Assessment:  After physical and neurologic examination, review of laboratory studies, imaging, neurophysiology testing and pre-existing records, assessment is  1) orthostatic dizziness , likely medication induced.  The vestibular rehab  improved by its own merits or by coincidence. The bladder toning medications seemed to have had a major effect.  2) nocturia , patient feels there is no underlying OSA, no snoring. No witnessed apnea. Continue evaluation with ONO. 3) no  Acute cerebrovascular abnormalities. Bruit? Get carotid doppler study.    Plan:  Treatment plan and additional workup : carotid doppler , ONO.

## 2012-11-14 NOTE — Patient Instructions (Signed)

## 2012-11-22 ENCOUNTER — Telehealth: Payer: Self-pay | Admitting: *Deleted

## 2012-11-22 NOTE — Telephone Encounter (Signed)
Derek Carr, daughter called back and is asking about the tests ordered for her father.  Carotid doppler and the pulse oximetry (home).  Will touch base with Kaylyn Lim, and also sleep lab about the pulse ox testing.

## 2012-11-22 NOTE — Telephone Encounter (Signed)
Called patient and left VM message for call back for specific test referring about

## 2012-11-23 ENCOUNTER — Other Ambulatory Visit: Payer: Self-pay | Admitting: Neurology

## 2012-11-23 DIAGNOSIS — R0989 Other specified symptoms and signs involving the circulatory and respiratory systems: Secondary | ICD-10-CM

## 2012-11-27 ENCOUNTER — Encounter: Payer: Self-pay | Admitting: Neurology

## 2012-11-27 NOTE — Telephone Encounter (Signed)
Order for ONO submitted to [facility] on 11/20/2012 - Will call and check status.

## 2012-11-28 ENCOUNTER — Ambulatory Visit: Payer: Medicare Other

## 2012-11-29 ENCOUNTER — Ambulatory Visit (INDEPENDENT_AMBULATORY_CARE_PROVIDER_SITE_OTHER): Payer: Medicare Other

## 2012-11-29 DIAGNOSIS — R0989 Other specified symptoms and signs involving the circulatory and respiratory systems: Secondary | ICD-10-CM

## 2012-12-04 ENCOUNTER — Telehealth: Payer: Self-pay | Admitting: Neurology

## 2012-12-05 NOTE — Telephone Encounter (Signed)
Called to inform per Scherrie Gerlach that dobbler study has not been read as of yet, lt VM message

## 2012-12-06 NOTE — Telephone Encounter (Signed)
Do you want me to notify this patient of essentially normal overnight oximetry?  This was scanned on 12/01/12 and may still be in your inbox.  He was calling asking about it.  I just want to make sure I tell him your interpretation.

## 2012-12-07 ENCOUNTER — Telehealth: Payer: Self-pay

## 2012-12-07 NOTE — Telephone Encounter (Signed)
Left message for pt that results were normal on ONO.

## 2012-12-07 NOTE — Telephone Encounter (Signed)
Yes please, let him know- normal test ONO. this is very helpful , Thank you.

## 2012-12-12 ENCOUNTER — Telehealth: Payer: Self-pay | Admitting: Neurology

## 2012-12-13 NOTE — Telephone Encounter (Signed)
Called patient and informed that physician has not reviewed doppler as of yet,patient understood

## 2012-12-13 NOTE — Telephone Encounter (Signed)
I called and spoke to Mrs. Deyoung, and then left message on daughter's VM, Zella Ball about the results of Carotid doppler.  Negative for blockages, some plaque on R side.  Call back as needed.

## 2012-12-18 ENCOUNTER — Telehealth: Payer: Self-pay | Admitting: *Deleted

## 2012-12-27 ENCOUNTER — Telehealth: Payer: Self-pay | Admitting: Neurology

## 2013-02-25 NOTE — H&P (Signed)
TOTAL HIP ADMISSION H&P  Patient is admitted for right total hip arthroplasty, anterior approach.  Subjective:  Chief Complaint: Right hip OA / pain  HPI: Derek Carr, 80 y.o. male, has a history of pain and functional disability in the right hip(s) due to arthritis and patient has failed non-surgical conservative treatments for greater than 12 weeks to include NSAID's and/or analgesics, corticosteriod injections and activity modification.  Onset of symptoms was gradual starting >10 years ago with gradually worsening course since that time.The patient noted no past surgery on the right hip(s).  Patient currently rates pain in the right hip at 7 out of 10 with activity. Patient has worsening of pain with activity and weight bearing, trendelenberg gait, pain that interfers with activities of daily living and pain with passive range of motion. Patient has evidence of periarticular osteophytes and joint space narrowing by imaging studies. This condition presents safety issues increasing the risk of falls.  There is no current active infection.   Risks, benefits and expectations were discussed with the patient.  Risks including but not limited to the risk of anesthesia, blood clots, nerve damage, blood vessel damage, failure of the prosthesis, infection and up to and including death.  Patient understand the risks, benefits and expectations and wishes to proceed with surgery.   D/C Plans:   Home with HHPT  Post-op Meds:     No Rx given  Tranexamic Acid:   To be given  Decadron:    To be given  FYI:    ASA post-op  Norco post-op   Patient Active Problem List   Diagnosis Date Noted  . Hyperlipidemia   . Erectile dysfunction   . History of asbestos exposure   . Nocturia   . History of shingles   . Dermatitis, atopic   . OA (osteoarthritis) of hip   . Arthritis   . Frequency of urination   . Lumbar scoliosis   . Recurrent prostate adenocarcinoma 06/07/2011  . 80 year old gentleman with  stage T3 adenocarcinoma prostate with Gleason score 4+4 and PSA of 10.3 05/11/2011   Past Medical History  Diagnosis Date  . Hyperlipidemia   . Hypertension   . Erectile dysfunction   . Prostate cancer 05/11/2011    bx=Adenocarcinoma,gleason3+4=7, 4+4=8,PSA=10.30volume=45.7cc  . History of asbestos exposure   . Nocturia   . History of shingles 2012-- BILATERAL EYES--  NO RESIDUAL  . Dermatitis, atopic ARMS AND LEGS  . OA (osteoarthritis) of hip RIGHT  . Arthritis   . Frequency of urination   . Smokers' cough   . Lumbar scoliosis   . History of radiation therapy 8/19-13-10/18/11    prostate, 89 GY    Past Surgical History  Procedure Laterality Date  . Esophageus cyst removal  YRS AGO  . Umbilical hernia repair  1998    epigastric  . Prostate biopsy  05/11/11    Adenocarcinoma (MD OFFICE)  . Cardiac catheterization  02-24-2009  DR NASHER    MINOR CORONARY ARTERY IRREGULARITIES/ NORMAL LVSF/ EF 65-70%  . Attempted left vats/ left thoracotomy/ resection of the enormous, probable bronchogenic cyst  01-04-2005  DR Arlyce Dice  . Repair right inguinal hernia w/ mesh  09-10-1999  . Lumbar spine surgery  1991  . Radioactive seed implant  11/15/2011    Procedure: RADIOACTIVE SEED IMPLANT;  Surgeon: Dutch Gray, MD;  Location: [facility];  Service: Urology;  Laterality: N/A;  Total number of seeds - 52  . Cystoscopy  11/15/2011  Procedure: CYSTOSCOPY;  Surgeon: Dutch Gray, MD;  Location: [facility];  Service: Urology;  Laterality: N/A;  no seeds seen in bladder    No prescriptions prior to admission   No Known Allergies   History  Substance Use Topics  . Smoking status: Former Smoker -- 0.10 packs/day for 30 years    Types: Cigarettes  . Smokeless tobacco: Never Used     Comment: HAS CUT DOWN SMOKING FROM 1PPD TO 1PPWK  . Alcohol Use: 0.6 oz/week    1 Cans of beer per week    Family History  Problem Relation Age of Onset  . Hypertension Mother   .  Prostate cancer Brother     surgery/cured  . Lung cancer Brother     new primary/same brother     Review of Systems  Constitutional: Negative.   HENT: Negative.   Eyes: Negative.   Respiratory: Negative.   Cardiovascular: Negative.   Gastrointestinal: Negative.   Genitourinary: Positive for urgency.  Musculoskeletal: Positive for joint pain.  Skin: Positive for rash.  Neurological: Negative.   Endo/Heme/Allergies: Negative.   Psychiatric/Behavioral: Negative.     Objective:  Physical Exam  Constitutional: He is oriented to person, place, and time. He appears well-developed and well-nourished.  HENT:  Head: Normocephalic and atraumatic.  Mouth/Throat: Oropharynx is clear and moist.  Eyes: Pupils are equal, round, and reactive to light.  Neck: Neck supple. No JVD present. No tracheal deviation present. No thyromegaly present.  Cardiovascular: Normal rate, regular rhythm, normal heart sounds and intact distal pulses.   Respiratory: Effort normal and breath sounds normal. No stridor. No respiratory distress. He has no wheezes.  GI: Soft. There is no tenderness. There is no guarding.  Musculoskeletal:       Right hip: He exhibits decreased range of motion, decreased strength, tenderness and bony tenderness. He exhibits no swelling, no deformity and no laceration.  Lymphadenopathy:    He has no cervical adenopathy.  Neurological: He is alert and oriented to person, place, and time.  Skin: Skin is warm and dry.  Psychiatric: He has a normal mood and affect.     Labs:  Estimated body mass index is 22.75 kg/(m^2) as calculated from the following:   Height as of 11/14/12: 6\' 3"  (1.905 m).   Weight as of 11/14/12: 82.555 kg (182 lb).   Imaging Review Plain radiographs demonstrate severe degenerative joint disease of the right hip(s). The bone quality appears to be good for age and reported activity level.  Assessment/Plan:  End stage arthritis, right hip(s)  The patient  history, physical examination, clinical judgement of the provider and imaging studies are consistent with end stage degenerative joint disease of the right hip(s) and total hip arthroplasty is deemed medically necessary. The treatment options including medical management, injection therapy, arthroscopy and arthroplasty were discussed at length. The risks and benefits of total hip arthroplasty were presented and reviewed. The risks due to aseptic loosening, infection, stiffness, dislocation/subluxation,  thromboembolic complications and other imponderables were discussed.  The patient acknowledged the explanation, agreed to proceed with the plan and consent was signed. Patient is being admitted for inpatient treatment for surgery, pain control, PT, OT, prophylactic antibiotics, VTE prophylaxis, progressive ambulation and ADL's and discharge planning.The patient is planning to be discharged home with home health services.     [place] Pugh Eusebia Grulke   PAC  02/25/2013, 3:26 PM

## 2013-03-12 ENCOUNTER — Encounter (HOSPITAL_COMMUNITY): Payer: Self-pay | Admitting: Pharmacy Technician

## 2013-03-14 ENCOUNTER — Other Ambulatory Visit (HOSPITAL_COMMUNITY): Payer: Self-pay | Admitting: Orthopedic Surgery

## 2013-03-14 NOTE — Patient Instructions (Addendum)
      Your procedure is scheduled on:  03/20/13  TUESDAY  Report to [facility] at   0900    AM.  Call this number if you have problems the morning of surgery: [phone]        Do not eat food :After Midnight. Monday NIGHT-- MAY HAVE CLEAR LIQUIDS Tuesday MORNING UNTIL 0600 AM-- THEN NOTHING BY MOUTH   Take these medicines the morning of surgery with A SIP OF WATER: Valtrex,  Bicalutamide BRING COMBIGAN  DROPS WITH YOU IN SUITCASE   .  Contacts, dentures or partial plates, or metal hairpins  can not be worn to surgery. Your family will be responsible for glasses, dentures, hearing aides while you are in surgery  Leave suitcase in the car. After surgery it may be brought to your room.  For patients admitted to the hospital, checkout time is 11:00 AM day of  discharge.                DO NOT WEAR JEWELRY, LOTIONS, POWDERS, OR PERFUMES.  WOMEN-- DO NOT SHAVE LEGS OR UNDERARMS FOR 48 HOURS BEFORE SHOWERS. MEN MAY SHAVE FACE.  Patients discharged the day of surgery will not be allowed to drive home. IF going home the day of surgery, you must have a driver and someone to stay with you for the first 24 hours  Name and phone number of your driver:   ADMISSION                                                                     Please read over the following fact sheets that you were given: MRSA Information, Incentive Spirometry Sheet, Blood Transfusion Sheet  Information                     FAILURE TO [place]                                                  Patient Signature _____________________________

## 2013-03-15 ENCOUNTER — Ambulatory Visit (HOSPITAL_COMMUNITY)
Admission: RE | Admit: 2013-03-15 | Discharge: 2013-03-15 | Disposition: A | Discharge status: Home / Self Care | Payer: Medicare Other | Source: Ambulatory Visit | Attending: Orthopedic Surgery | Admitting: Orthopedic Surgery

## 2013-03-15 ENCOUNTER — Encounter (HOSPITAL_COMMUNITY): Payer: Self-pay

## 2013-03-15 ENCOUNTER — Encounter (HOSPITAL_COMMUNITY)
Admission: RE | Admit: 2013-03-15 | Discharge: 2013-03-15 | Disposition: A | Discharge status: Home / Self Care | Payer: Medicare Other | Source: Ambulatory Visit | Attending: Orthopedic Surgery | Admitting: Orthopedic Surgery

## 2013-03-15 DIAGNOSIS — Z01810 Encounter for preprocedural cardiovascular examination: Secondary | ICD-10-CM | POA: Insufficient documentation

## 2013-03-15 DIAGNOSIS — M1610 Unilateral primary osteoarthritis, unspecified hip: Secondary | ICD-10-CM | POA: Insufficient documentation

## 2013-03-15 DIAGNOSIS — I4510 Unspecified right bundle-branch block: Secondary | ICD-10-CM | POA: Insufficient documentation

## 2013-03-15 DIAGNOSIS — Z01812 Encounter for preprocedural laboratory examination: Secondary | ICD-10-CM | POA: Insufficient documentation

## 2013-03-15 DIAGNOSIS — Z01818 Encounter for other preprocedural examination: Secondary | ICD-10-CM | POA: Insufficient documentation

## 2013-03-15 DIAGNOSIS — R9431 Abnormal electrocardiogram [ECG] [EKG]: Secondary | ICD-10-CM | POA: Insufficient documentation

## 2013-03-15 DIAGNOSIS — M169 Osteoarthritis of hip, unspecified: Secondary | ICD-10-CM | POA: Insufficient documentation

## 2013-03-15 HISTORY — DX: Unspecified atherosclerosis: I70.90

## 2013-03-15 HISTORY — DX: Diverticulosis of intestine, part unspecified, without perforation or abscess without bleeding: K57.90

## 2013-03-15 HISTORY — DX: Unspecified glaucoma: H40.9

## 2013-03-15 HISTORY — DX: Reserved for concepts with insufficient information to code with codable children: IMO0002

## 2013-03-15 LAB — URINALYSIS, ROUTINE W REFLEX MICROSCOPIC
Bilirubin Urine: NEGATIVE
GLUCOSE, UA: NEGATIVE mg/dL
Hgb urine dipstick: NEGATIVE
KETONES UR: NEGATIVE mg/dL
Leukocytes, UA: NEGATIVE
Nitrite: NEGATIVE
PROTEIN: 30 mg/dL — AB
Specific Gravity, Urine: 1.016 (ref 1.005–1.030)
Urobilinogen, UA: 0.2 mg/dL (ref 0.0–1.0)
pH: 6 (ref 5.0–8.0)

## 2013-03-15 LAB — CBC
HCT: 38.1 % — ABNORMAL LOW (ref 39.0–52.0)
Hemoglobin: 12.7 g/dL — ABNORMAL LOW (ref 13.0–17.0)
MCH: 27.7 pg (ref 26.0–34.0)
MCHC: 33.3 g/dL (ref 30.0–36.0)
MCV: 83.2 fL (ref 78.0–100.0)
Platelets: 208 10*3/uL (ref 150–400)
RBC: 4.58 MIL/uL (ref 4.22–5.81)
RDW: 15.2 % (ref 11.5–15.5)
WBC: 2.6 10*3/uL — ABNORMAL LOW (ref 4.0–10.5)

## 2013-03-15 LAB — BASIC METABOLIC PANEL
BUN: 16 mg/dL (ref 6–23)
CHLORIDE: 105 meq/L (ref 96–112)
CO2: 25 meq/L (ref 19–32)
Calcium: 9.9 mg/dL (ref 8.4–10.5)
Creatinine, Ser: 1.15 mg/dL (ref 0.50–1.35)
GFR calc Af Amer: 68 mL/min — ABNORMAL LOW (ref >90)
GFR, EST NON AFRICAN AMERICAN: 59 mL/min — AB (ref >90)
GLUCOSE: 74 mg/dL (ref 70–99)
POTASSIUM: 4.2 meq/L (ref 3.7–5.3)
SODIUM: 140 meq/L (ref 137–147)

## 2013-03-15 LAB — APTT: aPTT: 27 seconds (ref 24–37)

## 2013-03-15 LAB — PROTIME-INR
INR: 0.98 (ref 0.00–1.49)
PROTHROMBIN TIME: 12.8 s (ref 11.6–15.2)

## 2013-03-15 LAB — URINE MICROSCOPIC-ADD ON

## 2013-03-15 LAB — ABO/RH: ABO/RH(D): O POS

## 2013-03-15 LAB — SURGICAL PCR SCREEN
MRSA, PCR: NEGATIVE
Staphylococcus aureus: NEGATIVE

## 2013-03-16 NOTE — Progress Notes (Signed)
Late entry for 03/15/13-  Faxed u/a with micro and CBC to Dr Alvan Dame via epic.  03/16/13  1515 confirmed with Debbie at [facility] that lab work was received. Stress test report 11/10 on chart

## 2013-03-20 ENCOUNTER — Encounter (HOSPITAL_COMMUNITY): Payer: Medicare Other | Admitting: Anesthesiology

## 2013-03-20 ENCOUNTER — Inpatient Hospital Stay (HOSPITAL_COMMUNITY)
Admission: RE | Admit: 2013-03-20 | Discharge: 2013-03-23 | DRG: 470 | Disposition: A | Discharge status: Skilled Nursing Facility | Payer: Medicare Other | Source: Ambulatory Visit | Attending: Orthopedic Surgery | Admitting: Orthopedic Surgery

## 2013-03-20 ENCOUNTER — Inpatient Hospital Stay (HOSPITAL_COMMUNITY): Payer: Medicare Other | Admitting: Anesthesiology

## 2013-03-20 ENCOUNTER — Encounter (HOSPITAL_COMMUNITY): Payer: Self-pay | Admitting: *Deleted

## 2013-03-20 ENCOUNTER — Inpatient Hospital Stay (HOSPITAL_COMMUNITY): Payer: Medicare Other

## 2013-03-20 ENCOUNTER — Encounter (HOSPITAL_COMMUNITY)
Admission: RE | Disposition: A | Discharge status: Skilled Nursing Facility | Payer: Self-pay | Source: Ambulatory Visit | Attending: Orthopedic Surgery

## 2013-03-20 DIAGNOSIS — M169 Osteoarthritis of hip, unspecified: Principal | ICD-10-CM | POA: Diagnosis present

## 2013-03-20 DIAGNOSIS — I10 Essential (primary) hypertension: Secondary | ICD-10-CM | POA: Diagnosis present

## 2013-03-20 DIAGNOSIS — Z87891 Personal history of nicotine dependence: Secondary | ICD-10-CM

## 2013-03-20 DIAGNOSIS — E785 Hyperlipidemia, unspecified: Secondary | ICD-10-CM | POA: Diagnosis present

## 2013-03-20 DIAGNOSIS — H409 Unspecified glaucoma: Secondary | ICD-10-CM | POA: Diagnosis present

## 2013-03-20 DIAGNOSIS — D649 Anemia, unspecified: Secondary | ICD-10-CM | POA: Diagnosis not present

## 2013-03-20 DIAGNOSIS — D62 Acute posthemorrhagic anemia: Secondary | ICD-10-CM | POA: Diagnosis not present

## 2013-03-20 DIAGNOSIS — M1610 Unilateral primary osteoarthritis, unspecified hip: Principal | ICD-10-CM | POA: Diagnosis present

## 2013-03-20 DIAGNOSIS — Z8546 Personal history of malignant neoplasm of prostate: Secondary | ICD-10-CM

## 2013-03-20 DIAGNOSIS — Z96649 Presence of unspecified artificial hip joint: Secondary | ICD-10-CM

## 2013-03-20 DIAGNOSIS — Z923 Personal history of irradiation: Secondary | ICD-10-CM

## 2013-03-20 HISTORY — PX: TOTAL HIP ARTHROPLASTY: SHX124

## 2013-03-20 LAB — TYPE AND SCREEN
ABO/RH(D): O POS
ANTIBODY SCREEN: NEGATIVE

## 2013-03-20 SURGERY — ARTHROPLASTY, HIP, TOTAL, ANTERIOR APPROACH
Anesthesia: General | Site: Hip | Laterality: Right

## 2013-03-20 MED ORDER — SODIUM CHLORIDE 0.9 % IV SOLN
100.0000 mL/h | INTRAVENOUS | Status: DC
  Administered 2013-03-20 – 2013-03-21 (×2): 100 mL/h via INTRAVENOUS
  Filled 2013-03-20 (×10): qty 1000

## 2013-03-20 MED ORDER — MENTHOL 3 MG MT LOZG: 1.0000 | LOZENGE | OROMUCOSAL | Status: DC | PRN

## 2013-03-20 MED ORDER — TIMOLOL MALEATE 0.5 % OP SOLN
1.0000 [drp] | Freq: Two times a day (BID) | OPHTHALMIC | Status: DC
  Filled 2013-03-20: qty 5

## 2013-03-20 MED ORDER — DEXAMETHASONE SODIUM PHOSPHATE 10 MG/ML IJ SOLN
INTRAMUSCULAR | Status: AC
  Filled 2013-03-20: qty 1

## 2013-03-20 MED ORDER — POLYETHYLENE GLYCOL 3350 17 G PO PACK
17.0000 g | PACK | Freq: Two times a day (BID) | ORAL | Status: DC
  Administered 2013-03-20 – 2013-03-23 (×3): 17 g via ORAL

## 2013-03-20 MED ORDER — CHLORHEXIDINE GLUCONATE 4 % EX LIQD: 60.0000 mL | Freq: Once | CUTANEOUS | Status: DC

## 2013-03-20 MED ORDER — PHENYLEPHRINE HCL 10 MG/ML IJ SOLN
INTRAMUSCULAR | Status: DC | PRN
  Administered 2013-03-20 (×3): 80 ug via INTRAVENOUS

## 2013-03-20 MED ORDER — HYDROMORPHONE HCL PF 1 MG/ML IJ SOLN
INTRAMUSCULAR | Status: AC
  Filled 2013-03-20: qty 1

## 2013-03-20 MED ORDER — BRIMONIDINE TARTRATE 0.2 % OP SOLN
1.0000 [drp] | Freq: Two times a day (BID) | OPHTHALMIC | Status: DC
  Administered 2013-03-20 – 2013-03-23 (×6): 1 [drp] via OPHTHALMIC
  Filled 2013-03-20: qty 5

## 2013-03-20 MED ORDER — METOCLOPRAMIDE HCL 5 MG/ML IJ SOLN
5.0000 mg | Freq: Three times a day (TID) | INTRAMUSCULAR | Status: DC | PRN
  Administered 2013-03-20: 10 mg via INTRAVENOUS
  Filled 2013-03-20 (×2): qty 2

## 2013-03-20 MED ORDER — VALACYCLOVIR HCL 500 MG PO TABS
500.0000 mg | ORAL_TABLET | Freq: Two times a day (BID) | ORAL | Status: DC
  Administered 2013-03-20 – 2013-03-23 (×6): 500 mg via ORAL
  Filled 2013-03-20 (×7): qty 1

## 2013-03-20 MED ORDER — CELECOXIB 200 MG PO CAPS
200.0000 mg | ORAL_CAPSULE | Freq: Two times a day (BID) | ORAL | Status: DC
  Administered 2013-03-20 – 2013-03-23 (×6): 200 mg via ORAL
  Filled 2013-03-20 (×7): qty 1

## 2013-03-20 MED ORDER — HYDROCODONE-ACETAMINOPHEN 7.5-325 MG PO TABS
1.0000 | ORAL_TABLET | ORAL | Status: DC
  Administered 2013-03-20 – 2013-03-21 (×3): 1 via ORAL
  Administered 2013-03-21: 2 via ORAL
  Administered 2013-03-21 (×2): 1 via ORAL
  Filled 2013-03-20 (×5): qty 1
  Filled 2013-03-20: qty 2
  Filled 2013-03-20: qty 1

## 2013-03-20 MED ORDER — HYDRALAZINE HCL 20 MG/ML IJ SOLN
5.0000 mg | Freq: Once | INTRAMUSCULAR | Status: AC
  Administered 2013-03-20: 5 mg via INTRAVENOUS

## 2013-03-20 MED ORDER — 0.9 % SODIUM CHLORIDE (POUR BTL) OPTIME
TOPICAL | Status: DC | PRN
  Administered 2013-03-20: 1000 mL

## 2013-03-20 MED ORDER — ROCURONIUM BROMIDE 100 MG/10ML IV SOLN
INTRAVENOUS | Status: DC | PRN
  Administered 2013-03-20: 10 mg via INTRAVENOUS
  Administered 2013-03-20: 40 mg via INTRAVENOUS

## 2013-03-20 MED ORDER — METHOCARBAMOL 100 MG/ML IJ SOLN
500.0000 mg | Freq: Four times a day (QID) | INTRAVENOUS | Status: DC | PRN
  Administered 2013-03-20: 500 mg via INTRAVENOUS
  Filled 2013-03-20: qty 5

## 2013-03-20 MED ORDER — FLEET ENEMA 7-19 GM/118ML RE ENEM: 1.0000 | ENEMA | Freq: Once | RECTAL | Status: AC | PRN

## 2013-03-20 MED ORDER — ONDANSETRON HCL 4 MG PO TABS: 4.0000 mg | ORAL_TABLET | Freq: Four times a day (QID) | ORAL | Status: DC | PRN

## 2013-03-20 MED ORDER — ONDANSETRON HCL 4 MG/2ML IJ SOLN
INTRAMUSCULAR | Status: DC | PRN
  Administered 2013-03-20: 4 mg via INTRAVENOUS

## 2013-03-20 MED ORDER — SODIUM CHLORIDE 0.9 % IV SOLN
10.0000 mg | INTRAVENOUS | Status: DC | PRN
  Administered 2013-03-20: 10 ug/min via INTRAVENOUS

## 2013-03-20 MED ORDER — ONDANSETRON HCL 4 MG/2ML IJ SOLN
INTRAMUSCULAR | Status: AC
  Filled 2013-03-20: qty 2

## 2013-03-20 MED ORDER — DARIFENACIN HYDROBROMIDE ER 15 MG PO TB24
15.0000 mg | ORAL_TABLET | Freq: Every day | ORAL | Status: DC
  Administered 2013-03-20 – 2013-03-23 (×4): 15 mg via ORAL
  Filled 2013-03-20 (×4): qty 1

## 2013-03-20 MED ORDER — DIPHENHYDRAMINE HCL 25 MG PO CAPS: 25.0000 mg | ORAL_CAPSULE | Freq: Four times a day (QID) | ORAL | Status: DC | PRN

## 2013-03-20 MED ORDER — DEXAMETHASONE SODIUM PHOSPHATE 10 MG/ML IJ SOLN
INTRAMUSCULAR | Status: DC | PRN
  Administered 2013-03-20: 10 mg via INTRAVENOUS

## 2013-03-20 MED ORDER — PHENOL 1.4 % MT LIQD: 1.0000 | OROMUCOSAL | Status: DC | PRN

## 2013-03-20 MED ORDER — ONDANSETRON HCL 4 MG/2ML IJ SOLN: 4.0000 mg | Freq: Four times a day (QID) | INTRAMUSCULAR | Status: DC | PRN

## 2013-03-20 MED ORDER — HYDRALAZINE HCL 20 MG/ML IJ SOLN
INTRAMUSCULAR | Status: AC
  Filled 2013-03-20: qty 1

## 2013-03-20 MED ORDER — CEFAZOLIN SODIUM-DEXTROSE 2-3 GM-% IV SOLR
INTRAVENOUS | Status: AC
  Filled 2013-03-20: qty 50

## 2013-03-20 MED ORDER — BICALUTAMIDE 50 MG PO TABS
50.0000 mg | ORAL_TABLET | Freq: Every morning | ORAL | Status: DC
  Administered 2013-03-21 – 2013-03-23 (×3): 50 mg via ORAL
  Filled 2013-03-20 (×3): qty 1

## 2013-03-20 MED ORDER — METHOCARBAMOL 500 MG PO TABS
500.0000 mg | ORAL_TABLET | Freq: Four times a day (QID) | ORAL | Status: DC | PRN
  Administered 2013-03-20 – 2013-03-22 (×5): 500 mg via ORAL
  Filled 2013-03-20 (×5): qty 1

## 2013-03-20 MED ORDER — HYDROMORPHONE HCL PF 1 MG/ML IJ SOLN
0.5000 mg | INTRAMUSCULAR | Status: DC | PRN
  Administered 2013-03-20: 1 mg via INTRAVENOUS
  Filled 2013-03-20: qty 1

## 2013-03-20 MED ORDER — FENTANYL CITRATE 0.05 MG/ML IJ SOLN
INTRAMUSCULAR | Status: DC | PRN
  Administered 2013-03-20 (×3): 50 ug via INTRAVENOUS

## 2013-03-20 MED ORDER — LIDOCAINE HCL (CARDIAC) 20 MG/ML IV SOLN
INTRAVENOUS | Status: DC | PRN
  Administered 2013-03-20: 100 mg via INTRAVENOUS

## 2013-03-20 MED ORDER — DEXAMETHASONE SODIUM PHOSPHATE 10 MG/ML IJ SOLN
10.0000 mg | Freq: Once | INTRAMUSCULAR | Status: DC
  Filled 2013-03-20: qty 1

## 2013-03-20 MED ORDER — FERROUS SULFATE 325 (65 FE) MG PO TABS
325.0000 mg | ORAL_TABLET | Freq: Three times a day (TID) | ORAL | Status: DC
  Administered 2013-03-21 – 2013-03-23 (×5): 325 mg via ORAL
  Filled 2013-03-20 (×11): qty 1

## 2013-03-20 MED ORDER — VITAMIN D3 25 MCG (1000 UNIT) PO TABS
1000.0000 [IU] | ORAL_TABLET | Freq: Every day | ORAL | Status: DC
  Administered 2013-03-21 – 2013-03-23 (×3): 1000 [IU] via ORAL
  Filled 2013-03-20 (×4): qty 1

## 2013-03-20 MED ORDER — SIMVASTATIN 40 MG PO TABS
40.0000 mg | ORAL_TABLET | Freq: Every day | ORAL | Status: DC
Start: 2013-03-21 — End: 2013-03-23
  Administered 2013-03-21 – 2013-03-22 (×2): 40 mg via ORAL
  Filled 2013-03-20 (×3): qty 1

## 2013-03-20 MED ORDER — ALUM & MAG HYDROXIDE-SIMETH 200-200-20 MG/5ML PO SUSP: 30.0000 mL | ORAL | Status: DC | PRN

## 2013-03-20 MED ORDER — DOCUSATE SODIUM 100 MG PO CAPS
100.0000 mg | ORAL_CAPSULE | Freq: Two times a day (BID) | ORAL | Status: DC
  Administered 2013-03-20 – 2013-03-23 (×6): 100 mg via ORAL

## 2013-03-20 MED ORDER — LACTATED RINGERS IV SOLN
INTRAVENOUS | Status: DC
  Administered 2013-03-20 (×3): via INTRAVENOUS

## 2013-03-20 MED ORDER — BISACODYL 10 MG RE SUPP: 10.0000 mg | Freq: Every day | RECTAL | Status: DC | PRN

## 2013-03-20 MED ORDER — NEOSTIGMINE METHYLSULFATE 1 MG/ML IJ SOLN
INTRAMUSCULAR | Status: AC
  Filled 2013-03-20: qty 10

## 2013-03-20 MED ORDER — PHENYLEPHRINE 40 MCG/ML (10ML) SYRINGE FOR IV PUSH (FOR BLOOD PRESSURE SUPPORT)
PREFILLED_SYRINGE | INTRAVENOUS | Status: AC
  Filled 2013-03-20: qty 10

## 2013-03-20 MED ORDER — METOCLOPRAMIDE HCL 10 MG PO TABS
5.0000 mg | ORAL_TABLET | Freq: Three times a day (TID) | ORAL | Status: DC | PRN
  Filled 2013-03-20: qty 1

## 2013-03-20 MED ORDER — CEFAZOLIN SODIUM-DEXTROSE 2-3 GM-% IV SOLR
2.0000 g | INTRAVENOUS | Status: AC
  Administered 2013-03-20: 2 g via INTRAVENOUS

## 2013-03-20 MED ORDER — GLYCOPYRROLATE 0.2 MG/ML IJ SOLN
INTRAMUSCULAR | Status: AC
  Filled 2013-03-20: qty 3

## 2013-03-20 MED ORDER — PROMETHAZINE HCL 25 MG/ML IJ SOLN: 6.2500 mg | INTRAMUSCULAR | Status: DC | PRN

## 2013-03-20 MED ORDER — SUCCINYLCHOLINE CHLORIDE 20 MG/ML IJ SOLN
INTRAMUSCULAR | Status: DC | PRN
  Administered 2013-03-20: 100 mg via INTRAVENOUS

## 2013-03-20 MED ORDER — EZETIMIBE 10 MG PO TABS
10.0000 mg | ORAL_TABLET | Freq: Every morning | ORAL | Status: DC
  Administered 2013-03-21 – 2013-03-23 (×3): 10 mg via ORAL
  Filled 2013-03-20 (×3): qty 1

## 2013-03-20 MED ORDER — CEFAZOLIN SODIUM-DEXTROSE 2-3 GM-% IV SOLR
2.0000 g | Freq: Four times a day (QID) | INTRAVENOUS | Status: AC
  Administered 2013-03-20 – 2013-03-21 (×2): 2 g via INTRAVENOUS
  Filled 2013-03-20 (×2): qty 50

## 2013-03-20 MED ORDER — HYDROMORPHONE HCL PF 1 MG/ML IJ SOLN: 0.2500 mg | INTRAMUSCULAR | Status: DC | PRN

## 2013-03-20 MED ORDER — BRIMONIDINE TARTRATE-TIMOLOL 0.2-0.5 % OP SOLN
1.0000 [drp] | Freq: Two times a day (BID) | OPHTHALMIC | Status: DC
Start: 2013-03-20 — End: 2013-03-20

## 2013-03-20 MED ORDER — GLYCOPYRROLATE 0.2 MG/ML IJ SOLN
INTRAMUSCULAR | Status: DC | PRN
  Administered 2013-03-20: 0.6 mg via INTRAVENOUS

## 2013-03-20 MED ORDER — FENTANYL CITRATE 0.05 MG/ML IJ SOLN
INTRAMUSCULAR | Status: AC
  Filled 2013-03-20: qty 5

## 2013-03-20 MED ORDER — ASPIRIN EC 325 MG PO TBEC
325.0000 mg | DELAYED_RELEASE_TABLET | Freq: Two times a day (BID) | ORAL | Status: DC
  Administered 2013-03-21 – 2013-03-23 (×5): 325 mg via ORAL
  Filled 2013-03-20 (×7): qty 1

## 2013-03-20 MED ORDER — NEOSTIGMINE METHYLSULFATE 1 MG/ML IJ SOLN
INTRAMUSCULAR | Status: DC | PRN
  Administered 2013-03-20: 5 mg via INTRAVENOUS

## 2013-03-20 MED ORDER — PROPOFOL 10 MG/ML IV BOLUS
INTRAVENOUS | Status: DC | PRN
Start: 2013-03-20 — End: 2013-03-20
  Administered 2013-03-20: 200 mg via INTRAVENOUS

## 2013-03-20 MED ORDER — PROPOFOL 10 MG/ML IV BOLUS
INTRAVENOUS | Status: AC
  Filled 2013-03-20: qty 20

## 2013-03-20 MED ORDER — TRANEXAMIC ACID 100 MG/ML IV SOLN
1000.0000 mg | INTRAVENOUS | Status: AC
  Administered 2013-03-20: 1000 mg via INTRAVENOUS
  Filled 2013-03-20: qty 10

## 2013-03-20 SURGICAL SUPPLY — 37 items
BAG ZIPLOCK 12X15 (MISCELLANEOUS) IMPLANT
BLADE SAW SGTL 18X1.27X75 (BLADE) ×2 IMPLANT
CAPT HIP PF MOP ×2 IMPLANT
DERMABOND ADVANCED (GAUZE/BANDAGES/DRESSINGS) ×1
DERMABOND ADVANCED .7 DNX12 (GAUZE/BANDAGES/DRESSINGS) ×1 IMPLANT
DRAPE C-ARM 42X120 X-RAY (DRAPES) ×2 IMPLANT
DRAPE STERI IOBAN 125X83 (DRAPES) ×2 IMPLANT
DRAPE U-SHAPE 47X51 STRL (DRAPES) ×6 IMPLANT
DRSG AQUACEL AG ADV 3.5X10 (GAUZE/BANDAGES/DRESSINGS) ×2 IMPLANT
DRSG TEGADERM 4X4.75 (GAUZE/BANDAGES/DRESSINGS) IMPLANT
DURAPREP 26ML APPLICATOR (WOUND CARE) ×2 IMPLANT
ELECT BLADE TIP CTD 4 INCH (ELECTRODE) ×2 IMPLANT
ELECT REM PT RETURN 9FT ADLT (ELECTROSURGICAL) ×2
ELECTRODE REM PT RTRN 9FT ADLT (ELECTROSURGICAL) ×1 IMPLANT
EVACUATOR 1/8 PVC DRAIN (DRAIN) IMPLANT
FACESHIELD LNG OPTICON STERILE (SAFETY) ×8 IMPLANT
GAUZE SPONGE 2X2 8PLY STRL LF (GAUZE/BANDAGES/DRESSINGS) IMPLANT
GLOVE BIOGEL PI IND STRL 7.5 (GLOVE) ×1 IMPLANT
GLOVE BIOGEL PI IND STRL 8 (GLOVE) ×1 IMPLANT
GLOVE BIOGEL PI INDICATOR 7.5 (GLOVE) ×1
GLOVE BIOGEL PI INDICATOR 8 (GLOVE) ×1
GLOVE ECLIPSE 8.0 STRL XLNG CF (GLOVE) ×2 IMPLANT
GLOVE ORTHO TXT STRL SZ7.5 (GLOVE) ×4 IMPLANT
GOWN SPEC L3 XXLG W/TWL (GOWN DISPOSABLE) ×6 IMPLANT
GOWN STRL REUS W/TWL LRG LVL3 (GOWN DISPOSABLE) ×2 IMPLANT
KIT BASIN OR (CUSTOM PROCEDURE TRAY) ×2 IMPLANT
PACK TOTAL JOINT (CUSTOM PROCEDURE TRAY) ×2 IMPLANT
PADDING CAST COTTON 6X4 STRL (CAST SUPPLIES) ×2 IMPLANT
SPONGE GAUZE 2X2 STER 10/PKG (GAUZE/BANDAGES/DRESSINGS)
SUT MNCRL AB 4-0 PS2 18 (SUTURE) ×2 IMPLANT
SUT VIC AB 1 CT1 36 (SUTURE) ×6 IMPLANT
SUT VIC AB 2-0 CT1 27 (SUTURE) ×2
SUT VIC AB 2-0 CT1 TAPERPNT 27 (SUTURE) ×2 IMPLANT
SUT VLOC 180 0 24IN GS25 (SUTURE) ×2 IMPLANT
TOWEL OR 17X26 10 PK STRL BLUE (TOWEL DISPOSABLE) ×2 IMPLANT
TOWEL OR NON WOVEN STRL DISP B (DISPOSABLE) ×2 IMPLANT
TRAY FOLEY CATH 16FRSI W/METER (SET/KITS/TRAYS/PACK) ×2 IMPLANT

## 2013-03-20 NOTE — Op Note (Signed)
NAME:  Derek Carr                ACCOUNT NO.: [id number]      MEDICAL RECORD NO.: [id number]      FACILITY:  [facility]      PHYSICIAN:  Paralee Cancel D  DATE OF BIRTH:  [birth year]     DATE OF PROCEDURE:  03/20/2013                                 OPERATIVE REPORT         PREOPERATIVE DIAGNOSIS: Right  hip osteoarthritis.      POSTOPERATIVE DIAGNOSIS:  Right hip osteoarthritis.      PROCEDURE:  Right total hip replacement through an anterior approach   utilizing DePuy THR system, component size 69mm pinnacle cup, a size 36+4 neutral   Altrex liner, a size 7 Hi Tri Lock stem with a 36+1.5 Articuleze metal ball.      SURGEON:  Pietro Cassis. Alvan Dame, M.D.      ASSISTANT:  Molli Barrows, PA-C      ANESTHESIA:  General.      SPECIMENS:  None.      COMPLICATIONS:  None.      BLOOD LOSS:  200 cc     DRAINS:  None.      INDICATION OF THE PROCEDURE:  ILIYA SPIVACK is a 80 y.o. male who had   presented to office for evaluation of right hip pain.  Radiographs revealed   progressive degenerative changes with bone-on-bone   articulation to the  hip joint.  The patient had painful limited range of   motion significantly affecting their overall quality of life.  The patient was failing to    respond to conservative measures, and at this point was ready   to proceed with more definitive measures.  The patient has noted progressive   degenerative changes in his hip, progressive problems and dysfunction   with regarding the hip prior to surgery.  Consent was obtained for   benefit of pain relief.  Specific risk of infection, DVT, component   failure, dislocation, need for revision surgery, as well discussion of   the anterior versus posterior approach were reviewed.  Consent was   obtained for benefit of anterior pain relief through an anterior   approach.      PROCEDURE IN DETAIL:  The patient was brought to operative theater.   Once adequate anesthesia,  preoperative antibiotics, 2gm Ancef administered.   The patient was positioned supine on the OSI Hanna table.  Once adequate   padding of boney process was carried out, we had predraped out the hip, and  used fluoroscopy to confirm orientation of the pelvis and position.      The right hip was then prepped and draped from proximal iliac crest to   mid thigh with shower curtain technique.      Time-out was performed identifying the patient, planned procedure, and   extremity.     An incision was then made 2 cm distal and lateral to the   anterior superior iliac spine extending over the orientation of the   tensor fascia lata muscle and sharp dissection was carried down to the   fascia of the muscle and protractor placed in the soft tissues.      The fascia was then incised.  The muscle belly was identified and swept  laterally and retractor placed along the superior neck.  Following   cauterization of the circumflex vessels and removing some pericapsular   fat, a second cobra retractor was placed on the inferior neck.  A third   retractor was placed on the anterior acetabulum after elevating the   anterior rectus.  A L-capsulotomy was along the line of the   superior neck to the trochanteric fossa, then extended proximally and   distally.  Tag sutures were placed and the retractors were then placed   intracapsular.  We then identified the trochanteric fossa and   orientation of my neck cut, confirmed this radiographically   and then made a neck osteotomy with the femur on traction.  The femoral   head was removed without difficulty or complication.  Traction was let   off and retractors were placed posterior and anterior around the   acetabulum.      The labrum and foveal tissue were debrided.  I began reaming with a 68mm   reamer and reamed up to 62mm reamer with good bony bed preparation and a 60   cup was chosen.  The final 55mm Pinnacle cup was then impacted under fluoroscopy  to  confirm the depth of penetration and orientation with respect to   abduction.  A screw was placed followed by the hole eliminator.  The final   36+4 neutral Altrex liner was impacted with good visualized rim fit.  The cup was positioned anatomically within the acetabular portion of the pelvis.      At this point, the femur was rolled at 80 degrees.  Further capsule was   released off the inferior aspect of the femoral neck.  I then   released the superior capsule proximally.  The hook was placed laterally   along the femur and elevated manually and held in position with the bed   hook.  The leg was then extended and adducted with the leg rolled to 100   degrees of external rotation.  Once the proximal femur was fully   exposed, I used a box osteotome to set orientation.  I then began   broaching with the starting chili pepper broach and passed this by hand and then broached up to 7.  With the 7 broach in place I chose a high offset neck and did a trial reduction.  The offset was appropriate, leg lengths   appeared to be equal, confirmed radiographically.   Given these findings, I went ahead and dislocated the hip, repositioned all   retractors and positioned the right hip in the extended and abducted position.  The final 7 Hi Tri Lock stem was   chosen and it was impacted down to the level of neck cut.  Based on this   and the trial reduction, a 36+1.5 Articuleze metal ball was chosen and   impacted onto a clean and dry trunnion, and the hip was reduced.  The   hip had been irrigated throughout the case again at this point.  I did   reapproximate the superior capsular leaflet to the anterior leaflet   using #1 Vicryl.  The fascia of the   tensor fascia lata muscle was then reapproximated using #1 Vicryl and #0 V-lock sutures.  The   remaining wound was closed with 2-0 Vicryl and running 4-0 Monocryl.   The hip was cleaned, dried, and dressed sterilely using Dermabond and   Aquacel dressing.   She was then brought   to recovery room  in stable condition tolerating the procedure well.    Molli Barrows, PA-C was present for the entirety of the case involved from   preoperative positioning, perioperative retractor management, general   facilitation of the case, as well as primary wound closure as assistant.            Pietro Cassis Alvan Dame, M.D.        03/20/2013 1:37 PM

## 2013-03-20 NOTE — Interval H&P Note (Signed)
History and Physical Interval Note:  03/20/2013 10:30 AM  Derek Carr  has presented today for surgery, with the diagnosis of OA RIGHT HIP  The various methods of treatment have been discussed with the patient and family. After consideration of risks, benefits and other options for treatment, the patient has consented to  Procedure(s): RIGHT TOTAL HIP ARTHROPLASTY ANTERIOR APPROACH (Right) as a surgical intervention .  The patient's history has been reviewed, patient examined, no change in status, stable for surgery.  I have reviewed the patient's chart and labs.  Questions were answered to the patient's satisfaction.     [name]   

## 2013-03-20 NOTE — Anesthesia Preprocedure Evaluation (Addendum)
Anesthesia Evaluation  Patient identified by MRN, date of birth, ID band Patient awake    Reviewed: Allergy & Precautions, H&P , NPO status , Patient's Chart, lab work & pertinent test results  Airway Mallampati: II TM Distance: >3 FB Neck ROM: Full    Dental no notable dental hx.    Pulmonary neg pulmonary ROS, former smoker,  breath sounds clear to auscultation  Pulmonary exam normal       Cardiovascular hypertension, Pt. on medications Rhythm:Regular Rate:Normal     Neuro/Psych negative neurological ROS  negative psych ROS   GI/Hepatic negative GI ROS, Neg liver ROS,   Endo/Other  negative endocrine ROS  Renal/GU negative Renal ROS  negative genitourinary   Musculoskeletal negative musculoskeletal ROS (+)   Abdominal   Peds negative pediatric ROS (+)  Hematology negative hematology ROS (+)   Anesthesia Other Findings   Reproductive/Obstetrics negative OB ROS                          Anesthesia Physical Anesthesia Plan  ASA: II  Anesthesia Plan: General   Post-op Pain Management:    Induction: Intravenous  Airway Management Planned: Oral ETT  Additional Equipment:   Intra-op Plan:   Post-operative Plan: Extubation in OR  Informed Consent: I have reviewed the patients History and Physical, chart, labs and discussed the procedure including the risks, benefits and alternatives for the proposed anesthesia with the patient or authorized representative who has indicated his/her understanding and acceptance.   Dental advisory given  Plan Discussed with: CRNA and Surgeon  Anesthesia Plan Comments:         Anesthesia Quick Evaluation

## 2013-03-20 NOTE — Transfer of Care (Signed)
Immediate Anesthesia Transfer of Care Note  Patient: Derek Carr  Procedure(s) Performed: Procedure(s): RIGHT TOTAL HIP ARTHROPLASTY ANTERIOR APPROACH (Right)  Patient Location: PACU  Anesthesia Type:General  Level of Consciousness: sedated  Airway & Oxygen Therapy: Patient Spontanous Breathing and Patient connected to face mask oxygen  Post-op Assessment: Report given to PACU RN and Post -op Vital signs reviewed and stable  Post vital signs: Reviewed and stable  Complications: No apparent anesthesia complications

## 2013-03-20 NOTE — Interval H&P Note (Signed)
History and Physical Interval Note:  03/20/2013 10:30 AM  Derek Carr  has presented today for surgery, with the diagnosis of OA RIGHT HIP  The various methods of treatment have been discussed with the patient and family. After consideration of risks, benefits and other options for treatment, the patient has consented to  Procedure(s): RIGHT TOTAL HIP ARTHROPLASTY ANTERIOR APPROACH (Right) as a surgical intervention .  The patient's history has been reviewed, patient examined, no change in status, stable for surgery.  I have reviewed the patient's chart and labs.  Questions were answered to the patient's satisfaction.     [name]

## 2013-03-20 NOTE — Progress Notes (Signed)
Utilization review completed.  

## 2013-03-21 ENCOUNTER — Encounter (HOSPITAL_COMMUNITY): Payer: Self-pay | Admitting: Orthopedic Surgery

## 2013-03-21 LAB — BASIC METABOLIC PANEL
BUN: 15 mg/dL (ref 6–23)
CHLORIDE: 103 meq/L (ref 96–112)
CO2: 21 mEq/L (ref 19–32)
Calcium: 8.8 mg/dL (ref 8.4–10.5)
Creatinine, Ser: 1.06 mg/dL (ref 0.50–1.35)
GFR calc Af Amer: 75 mL/min — ABNORMAL LOW (ref >90)
GFR calc non Af Amer: 65 mL/min — ABNORMAL LOW (ref >90)
GLUCOSE: 109 mg/dL — AB (ref 70–99)
Potassium: 3.9 mEq/L (ref 3.7–5.3)
Sodium: 138 mEq/L (ref 137–147)

## 2013-03-21 LAB — CBC
HEMATOCRIT: 29.3 % — AB (ref 39.0–52.0)
Hemoglobin: 9.7 g/dL — ABNORMAL LOW (ref 13.0–17.0)
MCH: 27.2 pg (ref 26.0–34.0)
MCHC: 33.1 g/dL (ref 30.0–36.0)
MCV: 82.3 fL (ref 78.0–100.0)
Platelets: 172 10*3/uL (ref 150–400)
RBC: 3.56 MIL/uL — ABNORMAL LOW (ref 4.22–5.81)
RDW: 15.1 % (ref 11.5–15.5)
WBC: 6.2 10*3/uL (ref 4.0–10.5)

## 2013-03-21 MED ORDER — HYDROCODONE-ACETAMINOPHEN 7.5-325 MG PO TABS: 1.0000 | ORAL_TABLET | ORAL | Status: DC | PRN

## 2013-03-21 MED ORDER — TIZANIDINE HCL 4 MG PO TABS: 4.0000 mg | ORAL_TABLET | Freq: Four times a day (QID) | ORAL | Status: DC | PRN

## 2013-03-21 MED ORDER — DSS 100 MG PO CAPS: 100.0000 mg | ORAL_CAPSULE | Freq: Two times a day (BID) | ORAL | Status: DC

## 2013-03-21 MED ORDER — POLYETHYLENE GLYCOL 3350 17 G PO PACK: 17.0000 g | PACK | Freq: Two times a day (BID) | ORAL | Status: DC

## 2013-03-21 MED ORDER — ASPIRIN 325 MG PO TBEC: 325.0000 mg | DELAYED_RELEASE_TABLET | Freq: Two times a day (BID) | ORAL | Status: AC

## 2013-03-21 MED ORDER — FERROUS SULFATE 325 (65 FE) MG PO TABS: 325.0000 mg | ORAL_TABLET | Freq: Three times a day (TID) | ORAL | Status: DC

## 2013-03-21 NOTE — Progress Notes (Signed)
[facility]  [facility] is providing the following services: RW and Commode  If patient discharges after hours, please call [phone].   Linward Headland 03/21/2013, 11:12 AM

## 2013-03-21 NOTE — Progress Notes (Signed)
Patient ID: KREED KAUFFMAN, male   DOB: [birth year], 80 y.o.   MRN: [id number] Subjective: 1 Day Post-Op Procedure(s) (LRB): RIGHT TOTAL HIP ARTHROPLASTY ANTERIOR APPROACH (Right)    Patient reports pain as mild.  In chair, daughter in room.  Reviewed questions  Objective:   VITALS:   Filed Vitals:   03/21/13 0651  BP: 127/72  Pulse: 81  Temp: 98.4 F (36.9 C)  Resp: 18    Neurovascular intact Incision: dressing C/D/I  LABS  Recent Labs  03/21/13 0445  HGB 9.7*  HCT 29.3*  WBC 6.2  PLT 172     Recent Labs  03/21/13 0445  NA 138  K 3.9  BUN 15  CREATININE 1.06  GLUCOSE 109*    No results found for this basename: LABPT, INR,  in the last 72 hours   Assessment/Plan: 1 Day Post-Op Procedure(s) (LRB): RIGHT TOTAL HIP ARTHROPLASTY ANTERIOR APPROACH (Right)   Advance diet Up with therapy D/C IV fluids Plan for discharge tomorrow with home health PT  Therapy today WBAT

## 2013-03-21 NOTE — Progress Notes (Signed)
Physical Therapy Treatment Note   03/21/13 1500  PT Visit Information  Last PT Received On 03/21/13  Assistance Needed +1  History of Present Illness Pt is s/p R direct anterior THA  PT Time Calculation  PT Start Time 1441  PT Stop Time 1500  PT Time Calculation (min) 19 min  Subjective Data  Subjective Pt agreeable to ambulate again in hallway and then assisted back to bed.    Precautions  Precautions Fall  Restrictions  Other Position/Activity Restrictions WBAT  Cognition  Arousal/Alertness Awake/alert  Behavior During Therapy WFL for tasks assessed/performed  Overall Cognitive Status History of cognitive impairments - at baseline  Memory Decreased short-term memory  Bed Mobility  Overal bed mobility Needs Assistance  Bed Mobility Supine to Sit;Sit to Supine  Supine to sit Min assist  Sit to supine Mod assist  General bed mobility comments assist for R LE over EOB and bil LEs onto bed  Transfers  Overall transfer level Needs assistance  Equipment used Rolling walker (2 wheeled)  Transfers Sit to/from Stand  Sit to Stand Min guard  General transfer comment verbal cues for technique and safety  Ambulation/Gait  Ambulation/Gait assistance Min guard  Ambulation Distance (Feet) 40 Feet  Assistive device Rolling walker (2 wheeled)  Gait Pattern/deviations Step-to pattern;Decreased step length - right;Antalgic;Trunk flexed  Gait velocity decr  General Gait Details pt did better with requiring less cues however frequent multimodal cues still required for sequence, pt felt unable to perform full trunk/hip extension due to pain in R hip, difficulty advancing R LE due to weak hip flexors  PT - End of Session  Activity Tolerance Patient tolerated treatment well  Patient left in bed;with call bell/phone within reach;with family/visitor present  PT - Assessment/Plan  PT Plan Current plan remains appropriate  PT Frequency 7X/week  Follow Up Recommendations SNF;Supervision for  mobility/OOB  PT equipment Rolling walker with 5" wheels  PT Goal Progression  Progress towards PT goals Progressing toward goals  PT General Charges  $$ ACUTE PT VISIT 1 Procedure  PT Treatments  $Gait Training 8-22 mins   Carmelia Bake, PT, DPT 03/21/2013 Pager: [phone]

## 2013-03-21 NOTE — Progress Notes (Signed)
   CARE MANAGEMENT NOTE 03/21/2013  Patient:  Derek Carr, Derek Carr   Account Number:  [id number]  Date Initiated:  03/21/2013  Documentation initiated by:  [facility]  Subjective/Objective Assessment:   RIGHT TOTAL HIP ARTHROPLASTY     Action/Plan:   HH, lives at home with wife   Anticipated DC Date:  03/23/2013   Anticipated DC Plan:  SKILLED NURSING FACILITY  In-house referral  Clinical Social Worker      DC Planning Services  CM consult      Choice offered to / List presented to:             Status of service:  Completed, signed off Medicare Important Message given?   (If response is "NO", the following Medicare IM given date fields will be blank) Date Medicare IM given:   Date Additional Medicare IM given:    Discharge Disposition:  [place]  Per UR Regulation:    If discussed at Long Length of Stay Meetings, dates discussed:    Comments:  03/21/2013 1100 NCM spoke to pt and gave permission to speak to dtr, Tammy. Requesting 3n1 and RW for home. Dtr states she will speak to her mother to clarify RW is needed. Offered choice for [facility] and agreeable to [place] for [facility]. PT/OT is recommending SNF. Notified CSW for possible SNF placement. Jonnie Finner RN CCM Case Mgmt phone [phone]

## 2013-03-21 NOTE — Anesthesia Postprocedure Evaluation (Signed)
  Anesthesia Post-op Note  Patient: Derek Carr  Procedure(s) Performed: Procedure(s) (LRB): RIGHT TOTAL HIP ARTHROPLASTY ANTERIOR APPROACH (Right)  Patient Location: PACU  Anesthesia Type: General  Level of Consciousness: awake and alert   Airway and Oxygen Therapy: Patient Spontanous Breathing  Post-op Pain: mild  Post-op Assessment: Post-op Vital signs reviewed, Patient's Cardiovascular Status Stable, Respiratory Function Stable, Patent Airway and No signs of Nausea or vomiting  Last Vitals:  Filed Vitals:   03/21/13 0651  BP: 127/72  Pulse: 81  Temp: 36.9 C  Resp: 18    Post-op Vital Signs: stable   Complications: No apparent anesthesia complications

## 2013-03-21 NOTE — Progress Notes (Signed)
Clinical Social Work Department BRIEF PSYCHOSOCIAL ASSESSMENT 03/21/2013  Patient:  Derek Carr, Derek Carr     Account Number:  [id number]     Admit date:  03/20/2013  Clinical Social Worker:  Lacie Scotts  Date/Time:  03/21/2013 04:30 PM  Referred by:  RN  Date Referred:  03/21/2013 Referred for  SNF Placement   Other Referral:   Interview type:  Patient Other interview type:    PSYCHOSOCIAL DATA Living Status:  WIFE Admitted from facility:   Level of care:   Primary support name:  [name] Primary support relationship to patient:  SPOUSE Degree of support available:   supportive    CURRENT CONCERNS Current Concerns  Post-Acute Placement   Other Concerns:    SOCIAL WORK ASSESSMENT / PLAN Pt is a 80 yr old gentleman living at home prior to hospitalization. CSW met with met / family to assist with d/c planning. PT is recommending [facility] Rehab following hospital d/c. Pt / family are considering this option. They did provide permission for CSW to initiated SNF search and bed offers have been provided. If placement is accepted , pt / family have chosen [place] [facility] [employer]. CSW has received authorization for SNF placement from [facility]. CSW will continue to follow to assist with d/c planning.   Assessment/plan status:  Psychosocial Support/Ongoing Assessment of Needs Other assessment/ plan:   Information/referral to community resources:   SNF list with bed offers provided. Insurance coverage for SNF placement reviewed.    PATIENT'S/FAMILY'S RESPONSE TO PLAN OF CARE: D/C planning is ongoing. Pt family will decide in the am if pt will go home or accept rehab.    Werner Lean LCSW [phone]

## 2013-03-21 NOTE — Evaluation (Signed)
Physical Therapy Evaluation Patient Details Name: Derek Carr MRN: [id number] DOB: 1931/12/06 Today's Date: 03/21/2013 Time: 4098-1191 PT Time Calculation (min): 33 min  PT Assessment / Plan / Recommendation History of Present Illness  Pt is s/p R direct anterior THA  Clinical Impression  Pt currently with functional limitations due to the deficits listed below (see PT Problem List).  Pt will benefit from skilled PT to increase their independence and safety with mobility to allow discharge to the venue listed below.  Pt requiring step by step multimodal cues for mobility at this time and would benefit from ST-SNF prior to home.     PT Assessment  Patient needs continued PT services    Follow Up Recommendations  SNF;Supervision for mobility/OOB    Does the patient have the potential to tolerate intense rehabilitation      Barriers to Discharge        Equipment Recommendations  Rolling walker with 5" wheels    Recommendations for Other Services     Frequency 7X/week    Precautions / Restrictions Precautions Precautions: Fall Restrictions Weight Bearing Restrictions: No Other Position/Activity Restrictions: WBAT   Pertinent Vitals/Pain Premedicated, min-mod R hip pain with movement, repositioned, ice packs applied      Mobility  Bed Mobility General bed mobility comments: pt up in recliner on arrival Transfers Overall transfer level: Needs assistance (Simultaneous filing. User may not have seen previous data.) Equipment used: Rolling walker (2 wheeled) (Simultaneous filing. User may not have seen previous data.) Transfers: Sit to/from Stand (Simultaneous filing. User may not have seen previous data.) Sit to Stand: Min guard (Simultaneous filing. User may not have seen previous data.) General transfer comment: verbal cues for technique and safety (Simultaneous filing. User may not have seen previous data.) Ambulation/Gait Ambulation/Gait assistance: Min  assist Ambulation Distance (Feet): 30 Feet Assistive device: Rolling walker (2 wheeled) Gait Pattern/deviations: Step-to pattern;Antalgic;Decreased step length - right Gait velocity: decr General Gait Details: step by step multimodal cues required for sequence, RW distance, safety, increased time required    Exercises Total Joint Exercises Ankle Circles/Pumps: AROM;Both;15 reps Quad Sets: AROM;Both;10 reps Gluteal Sets: AROM;Both;10 reps Short Arc Quad: AROM;Right;10 reps Heel Slides: AAROM;Right;10 reps Hip ABduction/ADduction: AAROM;Right;10 reps   PT Diagnosis: Difficulty walking  PT Problem List: Decreased strength;Decreased mobility;Decreased safety awareness;Decreased knowledge of use of DME;Pain PT Treatment Interventions: Functional mobility training;Gait training;DME instruction;Patient/family education;Therapeutic activities;Therapeutic exercise     PT Goals(Current goals can be found in the care plan section) Acute Rehab PT Goals Patient Stated Goal: to return to independence PT Goal Formulation: With patient Time For Goal Achievement: 03/28/13 Potential to Achieve Goals: Good  Visit Information  Last PT Received On: 03/21/13 Assistance Needed: +1 History of Present Illness: Pt is s/p R direct anterior THA       Prior [place] expects to be discharged to:: Skilled nursing facility Living Arrangements: Spouse/significant other Type of Home: House Home Access: Ramped entrance [place]: One [place]: Crutches Prior Function Level of Independence: Independent Communication Communication: No difficulties    Cognition  Cognition Arousal/Alertness: Awake/alert Behavior During Therapy: WFL for tasks assessed/performed Overall Cognitive Status: History of cognitive impairments - at baseline Memory: Decreased short-term memory    Extremity/Trunk Assessment Upper Extremity Assessment Upper Extremity Assessment:  Overall WFL for tasks assessed Lower Extremity Assessment Lower Extremity Assessment: RLE deficits/detail RLE Deficits / Details: decreased functional hip weakness observed   Balance    End of Session PT - End  of Session Activity Tolerance: Patient tolerated treatment well Patient left: in chair;with call bell/phone within reach;with family/visitor present  GP     Kyle Stansell,KATHrine E 03/21/2013, 10:59 AM Carmelia Bake, PT, DPT 03/21/2013 Pager: [phone]

## 2013-03-21 NOTE — Evaluation (Signed)
Occupational Therapy Evaluation Patient Details Name: Derek Carr MRN: [id number] DOB: 18-Apr-1931 Today's Date: 03/21/2013 Time: 4098-1191 OT Time Calculation (min): 37 min  OT Assessment / Plan / Recommendation History of present illness Pt is s/p R direct anterior THA   Clinical Impression   Pt needing physical assist and max verbal cues for sequencing and hand placement with walker use and min to mod assist with ADL. Daughter present and wanting to pursue rehab. He will benefit from skilled OT services to improve ADL independence.     OT Assessment  Patient needs continued OT Services    Follow Up Recommendations  SNF;Supervision/Assistance - 24 hour    Barriers to Discharge      Equipment Recommendations  3 in 1 bedside comode    Recommendations for Other Services    Frequency  Min 2X/week    Precautions / Restrictions Precautions Precautions: Fall Restrictions Weight Bearing Restrictions: No Other Position/Activity Restrictions: WBAT    Pertinent Vitals/Pain 3/10 R hip; reposition, ice    ADL  Eating/Feeding: Independent Where Assessed - Eating/Feeding: Chair Grooming: Wash/dry hands;Set up Where Assessed - Grooming: Supported sitting Upper Body Bathing: Chest;Right arm;Left arm;Abdomen;Set up Where Assessed - Upper Body Bathing: Unsupported sitting Lower Body Bathing: Moderate assistance Where Assessed - Lower Body Bathing: Supported sit to stand Upper Body Dressing: Set up Where Assessed - Upper Body Dressing: Unsupported sitting Lower Body Dressing: Moderate assistance Where Assessed - Lower Body Dressing: Supported sit to Lobbyist: Moderate assistance max verbal and demo cues also Toilet Transfer Method: Arts development officer: Bedside commode Toileting - Clothing Manipulation and Hygiene: Moderate assistance Where Assessed - Toileting Clothing Manipulation and Hygiene: Sit to stand from 3-in-1 or toilet Equipment Used:  Rolling walker ADL Comments: Pt able to reach all the way down to R foot for LB dressing. He needs max verbal and demo cues for sequencing and safety with the walker and hand placement. He tries to let of the walker and then grip too low or high on the walker instead of right on the hand grips. Daughter is concerned about pt's wife being able to hep pt at home. She is interested in pursuing some rehab.     OT Diagnosis: Generalized weakness  OT Problem List: Decreased strength;Decreased knowledge of use of DME or AE OT Treatment Interventions: Self-care/ADL training;DME and/or AE instruction;Therapeutic activities;Patient/family education   OT Goals(Current goals can be found in the care plan section) Acute Rehab OT Goals Patient Stated Goal: to return to independence OT Goal Formulation: With patient/family Time For Goal Achievement: 03/28/13 Potential to Achieve Goals: Good  Visit Information  Last OT Received On: 03/21/13 Assistance Needed: +1 History of Present Illness: Pt is s/p R direct anterior THA       Prior [place] expects to be discharged to:: Skilled nursing facility Living Arrangements: Spouse/significant other Type of Home: House Home Access: Ramped entrance [place]: One [place]: [place]: Independent Communication Communication: No difficulties         Vision/Perception     Cognition  Cognition Arousal/Alertness: Awake/alert Behavior During Therapy: WFL for tasks assessed/performed Overall Cognitive Status: History of cognitive impairments - at baseline Memory: Decreased short-term memory    Extremity/Trunk Assessment Upper Extremity Assessment Upper Extremity Assessment: Overall WFL for tasks assessed Lower Extremity Assessment     Mobility Transfers Overall transfer level: Needs assistance Equipment used: Rolling walker (2 wheeled) Transfers: Sit  to/from  Stand Sit to Stand: Min assist;Mod assist General transfer comment: max verbal cues and physical assist for hand placement, assist to rise and steady as well as control descent.      Exercise     Balance     End of Session OT - End of Session Equipment Utilized During Treatment: Gait belt;Rolling walker Activity Tolerance: Patient tolerated treatment well Patient left: in chair;with call bell/phone within reach;with family/visitor present  [place], [place] [phone] 03/21/2013, 10:53 AM

## 2013-03-21 NOTE — Progress Notes (Signed)
Clinical Social Work Department CLINICAL SOCIAL WORK PLACEMENT NOTE 03/21/2013  Patient:  Derek Carr, Derek Carr  Account Number:  [id number] Admit date:  03/20/2013  Clinical Social Worker:  Werner Lean, LCSW  Date/time:  03/21/2013 04:43 PM  Clinical Social Work is seeking post-discharge placement for this patient at the following level of care:   SKILLED NURSING   (*CSW will update this form in Epic as items are completed)   03/21/2013  Patient/family provided with [place] Department of Clinical Social Work's list of facilities offering this level of care within the geographic area requested by the patient (or if unable, by the patient's family).  03/21/2013  Patient/family informed of their freedom to choose among providers that offer the needed level of care, that participate in Medicare, Medicaid or managed care program needed by the patient, have an available bed and are willing to accept the patient.    Patient/family informed of [facility]' ownership interest in [facility], as well as of the fact that they are under no obligation to receive care at this facility.  PASARR submitted to EDS on 03/21/2013 PASARR number received from EDS on 03/21/2013  FL2 transmitted to all facilities in geographic area requested by pt/family on  03/21/2013 FL2 transmitted to all facilities within larger geographic area on   Patient informed that his/her managed care company has contracts with or will negotiate with  certain facilities, including the following:     Patient/family informed of bed offers received:  03/21/2013 Patient chooses bed at [facility] Physician recommends and patient chooses bed at    Patient to be transferred to  on   Patient to be transferred to facility by   The following physician request were entered in Epic:   Additional Comments:  Werner Lean LCSW [phone]

## 2013-03-22 LAB — BASIC METABOLIC PANEL
BUN: 15 mg/dL (ref 6–23)
CHLORIDE: 105 meq/L (ref 96–112)
CO2: 24 mEq/L (ref 19–32)
CREATININE: 1.13 mg/dL (ref 0.50–1.35)
Calcium: 9.1 mg/dL (ref 8.4–10.5)
GFR, EST AFRICAN AMERICAN: 69 mL/min — AB (ref >90)
GFR, EST NON AFRICAN AMERICAN: 60 mL/min — AB (ref >90)
Glucose, Bld: 123 mg/dL — ABNORMAL HIGH (ref 70–99)
Potassium: 3.7 mEq/L (ref 3.7–5.3)
Sodium: 140 mEq/L (ref 137–147)

## 2013-03-22 LAB — CBC
HEMATOCRIT: 30.1 % — AB (ref 39.0–52.0)
Hemoglobin: 10 g/dL — ABNORMAL LOW (ref 13.0–17.0)
MCH: 27.5 pg (ref 26.0–34.0)
MCHC: 33.2 g/dL (ref 30.0–36.0)
MCV: 82.7 fL (ref 78.0–100.0)
Platelets: 178 10*3/uL (ref 150–400)
RBC: 3.64 MIL/uL — ABNORMAL LOW (ref 4.22–5.81)
RDW: 15.2 % (ref 11.5–15.5)
WBC: 7.6 10*3/uL (ref 4.0–10.5)

## 2013-03-22 MED ORDER — ACETAMINOPHEN 500 MG PO TABS
1000.0000 mg | ORAL_TABLET | Freq: Three times a day (TID) | ORAL | Status: DC
  Administered 2013-03-22 – 2013-03-23 (×4): 1000 mg via ORAL
  Filled 2013-03-22 (×7): qty 2

## 2013-03-22 NOTE — Significant Event (Signed)
The patient's wife called and said she was unable to reach the patient on the phone.  Evangeline, RN, told her to call back in just a few minutes and she would make sure the phone was within the patient's reach.  Upon entering the room, the patient was found sitting in the floor with his back propped against the recliner.  The patient stated he was trying to get the phone and his leg (operative one, Right) 'gave out' and he sat down on the floor.  He denies any pain, denies hitting his head or landing on his Right hip.  There is no shortening nor rotation of either leg. The patient was assisted back to bed by Evangeline, RN and myself with little difficulty. VS: 155/93  P: 87 R: 18  O2 sat 97% on room air.  Matt Babish PA-C, was notified of the above findings at 2105.  No new orders were given.  .   Just after the patient was assisted back to bed, 2025, his son arrived to stay the night.  Other family members arrived later around 2045.  Family at bedside, bed alarm activated.

## 2013-03-22 NOTE — Progress Notes (Signed)
CSW assisting with d/c planning. Spoke with MD this am. Pt will remain hospitalizaed today. SNF bed will be available Friday at [facility] if pt/family choose to accept placement. SNF / [facility] Medicare have been updated. Dr. Alvan Dame spoke with pt/family this am to provide medical and d/c update. CSW will continue to follow to assist with d/c planning.  Werner Lean LCSW [phone]

## 2013-03-22 NOTE — Progress Notes (Signed)
Physical Therapy Treatment Patient Details Name: MEHKI KLUMPP MRN: [id number] DOB: [birth year] Today's Date: 03/22/2013 Time: 4782-9562 PT Time Calculation (min): 27 min  PT Assessment / Plan / Recommendation  History of Present Illness Pt is s/p R direct anterior THA   PT Comments   Pt presents with decreased cognition and increased pain compared to yesterday.  Pt required +2 assist for physical assist and safety today.  Recommending SNF at this time.  Family into room later this morning and PT reported how this session went this morning and pt's increased need for assist.  Also discussed d/c options and explained why PT is recommending SNF at this time.   Follow Up Recommendations  SNF;Supervision/Assistance - 24 hour     Does the patient have the potential to tolerate intense rehabilitation     Barriers to Discharge        Equipment Recommendations  Rolling walker with 5" wheels;3in1 (PT);Wheelchair (measurements PT);Hospital bed;Other (comment) (hoyer lift)    Recommendations for Other Services    Frequency 7X/week   Progress towards PT Goals Progress towards PT goals: Not progressing toward goals - comment (decreased cognition and increased pain today)  Plan Current plan remains appropriate    Precautions / Restrictions Precautions Precautions: Fall Restrictions Other Position/Activity Restrictions: WBAT   Pertinent Vitals/Pain C/o increased R hip pain with movement/activity, unable to rate, assisted back to bed    Mobility  Bed Mobility Overal bed mobility: Needs Assistance Bed Mobility: Supine to Sit;Sit to Supine Supine to sit: +2 for physical assistance;Max assist Sit to supine: +2 for physical assistance;Total assist General bed mobility comments: increased cues and assist required today due to cognition and pt reporting increased R hip pain compared to yesterday (however also not receiving pain meds due to cognition) Transfers Overall transfer level: Needs  assistance Equipment used: Rolling walker (2 wheeled) Transfers: Sit to/from Stand Sit to Stand: +2 physical assistance;Max assist General transfer comment: multimodal cues for technique and safety Ambulation/Gait Ambulation/Gait assistance: Max assist;+2 physical assistance Ambulation Distance (Feet): 2 Feet Assistive device: Rolling walker (2 wheeled) Gait Pattern/deviations: Step-to pattern;Trunk flexed General Gait Details: pt requiring increased time and multimodal cues for sequence and technique for all mobility, pt only able to take a couple steps forward and then backwards to bed, pt unable to advance R LE and required assist, pt also reporting urine urgency so assisted with urinal however also got some urine on floor and gown so safely assisted back to bed as pt unsafe to ambulate at this time    Exercises     PT Diagnosis:    PT Problem List:   PT Treatment Interventions:     PT Goals (current goals can now be found in the care plan section)    Visit Information  Last PT Received On: 03/22/13 Assistance Needed: +2 History of Present Illness: Pt is s/p R direct anterior THA    Subjective Data      Cognition  Cognition Arousal/Alertness: Awake/alert Overall Cognitive Status: Impaired/Different from baseline Area of Impairment: Memory;Following commands;Safety/judgement;Problem solving Memory: Decreased short-term memory Following Commands: Follows one step commands inconsistently Safety/Judgement: Decreased awareness of safety Problem Solving: Slow processing;Decreased initiation;Difficulty sequencing;Requires verbal cues;Requires tactile cues    Balance     End of Session PT - End of Session Equipment Utilized During Treatment: Gait belt Activity Tolerance: Other (comment) (limited by cognition) Patient left: in bed;with call bell/phone within reach;with nursing/sitter in room (tech in room to change linen due to saturated with  urine prior to getting pt OOB, tech  to alarm bed) Nurse Communication: Mobility status   GP     Hosey Burmester,KATHrine E 03/22/2013, 1:04 PM Carmelia Bake, PT, DPT 03/22/2013 Pager: [phone]

## 2013-03-22 NOTE — Progress Notes (Signed)
Physical Therapy Treatment Note   03/22/13 1600  PT Visit Information  Last PT Received On 03/22/13  Assistance Needed +2  History of Present Illness Pt is s/p R direct anterior THA  PT Time Calculation  PT Start Time 1510  PT Stop Time 1533  PT Time Calculation (min) 23 min  Subjective Data  Subjective Pt ambulated in room only this afternoon with +2 assist for safety.  Pt continues to require multimodal cues which he inconsistently follows with increased time.  Pt assisted to recliner.  Family (spouse and daughter) in room and aware pt does not have chair alarm, to notify staff if leaving room, and that pt would be assisted back to bed by staff likely using lift for safety of pt and staff (nsg tech notified and aware).  Precautions  Precautions Fall  Restrictions  Other Position/Activity Restrictions WBAT  Cognition  Arousal/Alertness Awake/alert  Overall Cognitive Status Impaired/Different from baseline  Area of Impairment Memory;Following commands;Safety/judgement;Problem solving  Memory Decreased short-term memory  Following Commands Follows one step commands inconsistently  Safety/Judgement Decreased awareness of safety  Problem Solving Slow processing;Decreased initiation;Difficulty sequencing;Requires verbal cues;Requires tactile cues  Bed Mobility  Overal bed mobility Needs Assistance  Bed Mobility Supine to Sit  Supine to sit Mod assist;+2 for safety/equipment  General bed mobility comments increased cues for technique, assist for R LE and scooting hips EOB however better then this morning  Transfers  Overall transfer level Needs assistance  Equipment used Rolling walker (2 wheeled)  Transfers Sit to/from Stand  Sit to Stand +2 physical assistance;Max assist  General transfer comment multimodal cues for technique and safety  Ambulation/Gait  Ambulation/Gait assistance +2 physical assistance;Mod assist  Ambulation Distance (Feet) 10 Feet  Assistive device Rolling  walker (2 wheeled)  Gait Pattern/deviations Step-to pattern;Antalgic;Trunk flexed  Gait velocity decr  General Gait Details pt requiring increased time and multimodal cues for sequence and technique for all mobility, pt unable to advance R LE and required assist  PT - End of Session  Equipment Utilized During Treatment Gait belt  Activity Tolerance Other (comment) (limited by cognition)  Patient left with call bell/phone within reach;in chair;with family/visitor present  PT - Assessment/Plan  PT Plan Current plan remains appropriate  PT Frequency 7X/week  Follow Up Recommendations SNF;Supervision/Assistance - 24 hour  PT equipment Rolling walker with 5" wheels;3in1 (PT);Wheelchair (measurements PT);Hospital bed;Other (comment) (hoyer lift)  PT Goal Progression  Progress towards PT goals Progressing toward goals  PT General Charges  $$ ACUTE PT VISIT 1 Procedure  PT Treatments  $Gait Training 8-22 mins  $Therapeutic Activity 8-22 mins   Carmelia Bake, PT, DPT 03/22/2013 Pager: [phone]

## 2013-03-22 NOTE — Progress Notes (Signed)
Patient ID: Derek Carr, male   DOB: 04-Apr-1931, 80 y.o.   MRN: [id number] Subjective: 2 Days Post-Op Procedure(s) (LRB): RIGHT TOTAL HIP ARTHROPLASTY ANTERIOR APPROACH (Right)    Patient reports pain as mild. There has been some concern raised about confusion with [place] unwitnessed "fall" versus sliding down to floor from chair.  No pain or deformity involving his right lower extremity (operative side) or any extremity for that matter  Objective:   VITALS:   Filed Vitals:   03/22/13 0800  BP:   Pulse:   Temp:   Resp: 16    Neurovascular intact Incision: dressing C/D/I  LABS  Recent Labs  03/21/13 0445 03/22/13 0407  HGB 9.7* 10.0*  HCT 29.3* 30.1*  WBC 6.2 7.6  PLT 172 178     Recent Labs  03/21/13 0445 03/22/13 0407  NA 138 140  K 3.9 3.7  BUN 15 15  CREATININE 1.06 1.13  GLUCOSE 109* 123*    No results found for this basename: LABPT, INR,  in the last 72 hours   Assessment/Plan: 2 Days Post-Op Procedure(s) (LRB): RIGHT TOTAL HIP ARTHROPLASTY ANTERIOR APPROACH (Right)   Up with therapy Plan for discharge tomorrow, trying to determine if safe to go home versus ST SNF ([place]) Will D/C all narcotics for now to see if this doesn't help with his focus on rehabilitation efforts Tylenol and robaxin for pain control  Will base disposition plan on therapies assessment

## 2013-03-23 DIAGNOSIS — D649 Anemia, unspecified: Secondary | ICD-10-CM | POA: Diagnosis not present

## 2013-03-23 MED ORDER — ACETAMINOPHEN 500 MG PO TABS: 1000.0000 mg | ORAL_TABLET | Freq: Three times a day (TID) | ORAL | Status: DC

## 2013-03-23 MED ORDER — TIZANIDINE HCL 4 MG PO TABS: 4.0000 mg | ORAL_TABLET | Freq: Four times a day (QID) | ORAL | Status: DC | PRN

## 2013-03-23 NOTE — Progress Notes (Signed)
Clinical Social Work Department CLINICAL SOCIAL WORK PLACEMENT NOTE 03/23/2013  Patient:  Derek Carr, Derek Carr  Account Number:  [id number] Admit date:  03/20/2013  Clinical Social Worker:  Werner Lean, LCSW  Date/time:  03/21/2013 04:43 PM  Clinical Social Work is seeking post-discharge placement for this patient at the following level of care:   SKILLED NURSING   (*CSW will update this form in Epic as items are completed)   03/21/2013  Patient/family provided with [place] Department of Clinical Social Work's list of facilities offering this level of care within the geographic area requested by the patient (or if unable, by the patient's family).  03/21/2013  Patient/family informed of their freedom to choose among providers that offer the needed level of care, that participate in Medicare, Medicaid or managed care program needed by the patient, have an available bed and are willing to accept the patient.    Patient/family informed of [facility]' ownership interest in [facility], as well as of the fact that they are under no obligation to receive care at this facility.  PASARR submitted to EDS on 03/21/2013 PASARR number received from EDS on 03/21/2013  FL2 transmitted to all facilities in geographic area requested by pt/family on  03/21/2013 FL2 transmitted to all facilities within larger geographic area on   Patient informed that his/her managed care company has contracts with or will negotiate with  certain facilities, including the following:     Patient/family informed of bed offers received:  03/21/2013 Patient chooses bed at [facility] Physician recommends and patient chooses bed at    Patient to be transferred to [facility] on  03/23/2013 Patient to be transferred to facility by [facility]  The following physician request were entered in Epic:   Additional Comments:   Werner Lean LCSW [phone]

## 2013-03-23 NOTE — Plan of Care (Signed)
Problem: Consults Goal: Diagnosis- Total Joint Replacement Outcome: Completed/Met Date Met:  03/23/13 Primary Total Hip RIGHT  Problem: Discharge Progression Outcomes Goal: Barriers To Progression Addressed/Resolved Outcome: Progressing Pain/Agitation/Delirium (PAD) Protocol    DELIRIUM PREVENTION (Utilize non-pharmacological strategies to prevent delirium) 1. Frequent reorientation, staff role, procedure explanation (Cognition) 2. Keep call bell in reach, provide communication methods (Cognition) 3. Encourage family visitation, orientation, reminiscence (Cognition) 4. Provide distraction techniques, including possible music (Cognition) 5. When possible, avoid portable CXR or labs before 6:00 (Sleep cycle/deprivation) 6. Adequate lighting during the day, dark after 21:00 (Sleep cycle) 7. Limit noise, turn off music after 21:00, avoid procedures, bathing while sleeping (Sleep cycle) 8. Ensure hearing aids and eyeglasses are utilized, assess for ear wax that may decrease hearing (Sensory impairment) 9. Avoid physical restraints if possible (Immobility) 10. Promote early and frequent mobilization (Immobility) 11. Consult Physical and Occupational Therapy 12. Consider early mobilization of mechanically ventilated patients 13. Perform and document a wake-up assessment (WUA) daily unless contraindicated or otherwise ordered by physician. WUA begins between 7:30-8:00 am. a. If the patient is receiving intermittent sedation, minimize the use after 0600 if possible. If the patient is on a continuous infusion, STOP the infusion. If Versed greater than 29m/hr and/or Fentanyl greater than 300 micrograms/hour, reduce infusions by one-half rate. Do not administer PRN sedation. Continue to hold meds until patient is awake and able to follow simple commands, or until patient becomes agitated. b. If patient becomes agitated (RASS > +1), resume prior intermittent dosing or restart infusions at 50% of  previous dose and titrate to desired RASS score. c. If patient is calm and meets criteria complete the SBT. Contact MD for further weaning/extubation orders if appropriate.   TREATMENT OF PAIN AND AGITATION  Assess and record pain every four hours and PRN using appropriate pain scale; document pain goal.  Assess and record agitation every four hours and PRN using [place] Agitation Sedation Scale (RASS).  Ultimate RASS goal is zero. Maintain RASS at zero to -2. Determine approp  riate RASS goal every 4-hours.  Assess and record delirium score every four hours and PRN using Confusion Assessment method for   the   ICU (CAM-ICU).  When/if a medication dose range is specified, the first dose should be the lowest in the range. The amount of subsequent doses should be based on: the patient's response to the first dose; need for additional doses after the lowest dose given; severity of pain; and Pain Scale and/or RASS scores. Achieve pain and RASS scores with lowest level of medications possible.  Treat pain FIRST; do not proceed with meds for agitation until pain is adequately addressed.   Phase 1 Pain/Agitation/Delirium (PAD) Protocol: Fentanyl bolus  1. Initial analgesia: administer Fentanyl boluses PRN every 15 minutes x 3, then every 2-hours as needed to achieve pain/RASS goals 2. If pain and RASS and/or pain goals cannot be met: a. In [facility] Phase 2 PAD orders "per protocol, co-sign required". Initiate Fentanyl infusion as ordered. b. In Order Management, discontinue Phase 1 PAD Fentanyl order.   Phase 2 PAD Protocol: Fentanyl infusion 1. Administer Fentanyl bolus (dose per MD order). 2. Initiate Fentanyl infusion at 50 mcg/hour. Titrate up per titration scale every 30 minutes to achieve pain and RASS goals. Maximum dose is 400 mcg/hour. 3. If at any point, pain and RASS goals are not met, give bolus and titrate up to next dose level on titration  scale. 4. Consider weaning dose as appropriate  based on RASS score and/or response to WUA. 5. When pain or RASS goal is met with Fentanyl infusion at or less than 25 mcg/hour x 8 consecutive hours, consult with MD. Obtain an order to discontinue infusion in Order Management and place order for Phase 1 PAD Protocol (intermittent PRN doses of Fentanyl) "per protocol co-sign required". 6. Consider patch or enteral meds as appropriate. 7. At any time, if pain or RASS goals cannot be met, contact MD.   Phase 3 PAD Protocol: Medications to control agitation 1. Discuss continuation of previous pain meds with MD. 2. If Precedex is ordered: Add info about initiation and titration a.  Monitor for bradycardia (HR <60), hypotension (SBP <110), or arrhythmia after initiating                    Precedex b. Monitor patient for oversedation, apnea, bronchospasm, hypoventilation, and respiratory acidosis c. If patient is on propofol for sedation, begin PRECEDEX as ordered. After 10 minutes on                 PRECEDEX, discontinue propofol.  a. If patient is on continuous fentanyl infusion, AND provider opts to discontinue the infusion, begin PRECEDEX as ordered. After one hour on PRECEDEX, titrate fentanyl down 50% every 30 minutes and discontinue within 1-2 hours.     c. If Propofol is ordered, obtain baseline triglycerides and repeat q72 hours. d. If Versed is ordered: a. Administer intermittent Versed per medication administration instructions. If RASS goal not met consult with MD. e. At any time, if pain or RASS goals cannot be met, contact MD.   A benzodiazepam infusion is indicated only under select conditions, which include:   Receiving ECMO  Concomitant neuromuscular blockade  ARDS requiring deep sedation  Seizures  Alcohol withdrawal  Intracranial hypertension  Palliative/Comfort Care  Agitation not controlled by use of propofol, dexmedetomidine, AND intermittent benzodiazepines  (i.e., needing more than 3 intermittent doses per hour)  Use of propofol or dexmedetomidine is not feasible or contraindicated (e.g., hemodynamic instability, propofol-related infusion syndrome, hypertriglyceridemia)    1. Midazolam infusion: begin at 2 mg/hr and titrate to goal RASS up to a maximum of 10 mg/hr.  Titrate by 1 mg/hour every 30 minutes to goal. May administer bolus doses per MD order. 2. If RASS score is not met, contact MD. 3. Reduce infusion rate as indicated during daily wake-up assessments.   DELIRIUM  1.   If CAM-ICU score is positive and patient is agitated (RASS > 1), consult MD and pharmacist to discuss potential pharmacologic interventions. Use medication only when non pharmacological methods have not worked and a patient is distressed or a danger to themselves or others. 3. If CAM-ICU score is not met, contact MD. 4. Monitor QTc every shift and PRN as indicated. 5.  6.

## 2013-03-23 NOTE — Discharge Summary (Signed)
Physician Discharge Summary  Patient ID: Derek Carr MRN: [id number] DOB/AGE: 05/18/1931 80 y.o.  Admit date: 03/20/2013 Discharge date:  03/23/2013  Procedures:  Procedure(s) (LRB): RIGHT TOTAL HIP ARTHROPLASTY ANTERIOR APPROACH (Right)  Attending Physician:  Dr. Paralee Cancel   Admission Diagnoses:   Right hip OA / pain  Discharge Diagnoses:  Principal Problem:   S/P right THA, AA Active Problems:   Expected blood loss anemia  Past Medical History  Diagnosis Date  . Hyperlipidemia   . Hypertension   . Erectile dysfunction   . Prostate cancer 05/11/2011    bx=Adenocarcinoma,gleason3+4=7, 4+4=8,PSA=10.30volume=45.7cc  . History of asbestos exposure   . Nocturia   . History of shingles 2012-- BILATERAL EYES--  NO RESIDUAL  . Dermatitis, atopic ARMS AND LEGS  . OA (osteoarthritis) of hip RIGHT  . Arthritis   . Frequency of urination   . Smokers' cough   . Lumbar scoliosis   . History of radiation therapy 8/19-13-10/18/11    prostate, 7 GY  . Glaucoma   . DDD (degenerative disc disease)   . Diverticulosis   . Atherosclerosis     HPI: Derek Carr, 80 y.o. male, has a history of pain and functional disability in the right hip(s) due to arthritis and patient has failed non-surgical conservative treatments for greater than 12 weeks to include NSAID's and/or analgesics, corticosteriod injections and activity modification. Onset of symptoms was gradual starting >10 years ago with gradually worsening course since that time.The patient noted no past surgery on the right hip(s). Patient currently rates pain in the right hip at 7 out of 10 with activity. Patient has worsening of pain with activity and weight bearing, trendelenberg gait, pain that interfers with activities of daily living and pain with passive range of motion. Patient has evidence of periarticular osteophytes and joint space narrowing by imaging studies. This condition presents safety issues increasing the risk  of falls. There is no current active infection. Risks, benefits and expectations were discussed with the patient. Risks including but not limited to the risk of anesthesia, blood clots, nerve damage, blood vessel damage, failure of the prosthesis, infection and up to and including death. Patient understand the risks, benefits and expectations and wishes to proceed with surgery.   PCP: Jerlyn Ly, MD   Discharged Condition: good  Hospital Course:  Patient underwent the above stated procedure on 03/20/2013. Patient tolerated the procedure well and brought to the recovery room in good condition and subsequently to the floor.  POD #1 BP: 127/72 ; Pulse: 81 ; Temp: 98.4 F (36.9 C) ; Resp: 18  Patient reports pain as mild. In chair, daughter in room. Reviewed questions. Neurovascular intact, dorsiflexion/plantar flexion intact, incision: dressing C/D/I, no cellulitis present and compartment soft.   LABS  Basename    HGB  9.7  HCT  29.3   POD #2  BP: 175/90 ; Pulse: 84 ; Temp: 98.4 F (36.9 C) ; Resp: 18  Patient reports pain as mild. There has been some concern raised about confusion with [facility].  Unwitnessed "fall" versus sliding down to floor from chair. No pain or deformity involving his right lower extremity (operative side) or any extremity for that matter Neurovascular intact, dorsiflexion/plantar flexion intact, incision: dressing C/D/I, no cellulitis present and compartment soft.   LABS  Basename    HGB  10.0  HCT  30.1   POD #3  BP: 131/80 ; Pulse: 72 ; Temp: 97.5 F (36.4 C) ; Resp: 20  Patient reports  pain as mild, pain controlled. No events throughout the night. Family and patient are on board to go to a facility to receive more initial therapy. Ready to be discharged to SNF. Neurovascular intact, dorsiflexion/plantar flexion intact, incision: dressing C/D/I, no cellulitis present and compartment soft.   LABS   No new labs  Discharge Exam: General appearance: alert,  cooperative and no distress Extremities: Homans sign is negative, no sign of DVT, no edema, redness or tenderness in the calves or thighs and no ulcers, gangrene or trophic changes  Disposition:    Skilled nursing facility with follow up in 2 weeks   Follow-up Information   Follow up with [name], MD. Schedule an appointment as soon as possible for a visit in 2 weeks.   Specialty:  Orthopedic Surgery   Contact information:   [address] [place] [address] [phone]       Follow up with [facility])    Contact information:   [place] [address] [phone]       Discharge Orders   Future Orders Complete By Expires   Call MD / Call 911  As directed    Comments:     If you experience chest pain or shortness of breath, CALL 911 and be transported to the hospital emergency room.  If you develope a fever above 101 F, pus (white drainage) or increased drainage or redness at the wound, or calf pain, call your surgeon's office.   Change dressing  As directed    Comments:     Maintain surgical dressing for 10-14 days, or until follow up in the clinic.   Constipation Prevention  As directed    Comments:     Drink plenty of fluids.  Prune juice may be helpful.  You may use a stool softener, such as Colace (over the counter) 100 mg twice a day.  Use MiraLax (over the counter) for constipation as needed.   Diet - low sodium heart healthy  As directed    Discharge instructions  As directed    Comments:     Maintain surgical dressing for 10-14 days, or until follow up in the clinic. Follow up in 2 weeks at [facility]. Call with any questions or concerns.   Increase activity slowly as tolerated  As directed    TED hose  As directed    Comments:     Use stockings (TED hose) for 2 weeks on both leg(s).  You may remove them at night for sleeping.   Weight bearing as  tolerated  As directed    Questions:     Laterality:     Extremity:          Medication List         acetaminophen 500 MG tablet  Commonly known as:  TYLENOL  Take 2 tablets (1,000 mg total) by mouth every 8 (eight) hours.     alendronate 70 MG tablet  Commonly known as:  FOSAMAX  Take 70 mg by mouth every 7 (seven) days. Take with a full glass of water on an empty stomach.     aspirin 325 MG EC tablet  Take 1 tablet (325 mg total) by mouth 2 (two) times daily.     bicalutamide 50 MG tablet  Commonly known as:  CASODEX  Take 50 mg by mouth every morning.     cholecalciferol 1000 UNITS tablet  Commonly known  as:  VITAMIN D  Take 1,000 Units by mouth daily.     COMBIGAN 0.2-0.5 % ophthalmic solution  Generic drug:  brimonidine-timolol  Place 1 drop into both eyes every 12 (twelve) hours.     DSS 100 MG Caps  Take 100 mg by mouth 2 (two) times daily.     ezetimibe 10 MG tablet  Commonly known as:  ZETIA  Take 10 mg by mouth every morning.     ferrous sulfate 325 (65 FE) MG tablet  Take 1 tablet (325 mg total) by mouth 3 (three) times daily after meals.     folic acid 1 MG tablet  Commonly known as:  FOLVITE  Take 1 mg by mouth daily.     LUPRON IJ  Inject as directed every 4 (four) months. Q 4 Months     OVER THE COUNTER MEDICATION  Take 500 mg by mouth daily. Antacid supplement     polyethylene glycol packet  Commonly known as:  MIRALAX / GLYCOLAX  Take 17 g by mouth 2 (two) times daily.     pravastatin 80 MG tablet  Commonly known as:  PRAVACHOL  Take 80 mg by mouth every evening.     solifenacin 10 MG tablet  Commonly known as:  VESICARE  Take 10 mg by mouth daily.     tiZANidine 4 MG tablet  Commonly known as:  ZANAFLEX  Take 1 tablet (4 mg total) by mouth every 6 (six) hours as needed.     valACYclovir 500 MG tablet  Commonly known as:  VALTREX  Take 500 mg by mouth 2 (two) times daily.         Signed: [place] [facility]. Kele Withem    PAC  03/23/2013, 8:06 AM

## 2013-03-23 NOTE — Progress Notes (Signed)
   Subjective: 3 Days Post-Op Procedure(s) (LRB): RIGHT TOTAL HIP ARTHROPLASTY ANTERIOR APPROACH (Right)   Patient reports pain as mild, pain controlled. No events throughout the night. Family and patient are on board to go to a facility to receive more initial therapy.  Ready to be discharged to SNF.  Objective:   VITALS:   Filed Vitals:   03/23/13 0640  BP: 131/80  Pulse: 72  Temp: 97.5 F (36.4 C)  Resp: 20    Neurovascular intact Dorsiflexion/Plantar flexion intact Incision: dressing C/D/I No cellulitis present Compartment soft  LABS  Recent Labs  03/21/13 0445 03/22/13 0407  HGB 9.7* 10.0*  HCT 29.3* 30.1*  WBC 6.2 7.6  PLT 172 178     Recent Labs  03/21/13 0445 03/22/13 0407  NA 138 140  K 3.9 3.7  BUN 15 15  CREATININE 1.06 1.13  GLUCOSE 109* 123*     Assessment/Plan: 3 Days Post-Op Procedure(s) (LRB): RIGHT TOTAL HIP ARTHROPLASTY ANTERIOR APPROACH (Right) Up with therapy Discharge to SNF Follow up in 2 weeks at [facility]. Follow up with OLIN,Aizlynn Digilio D in 2 weeks.  Contact information:  [facility] [address] [place] [phone]    Expected ABLA  Treated with iron and will observe      [place] [facility]. Norlene Lanes   PAC  03/23/2013, 8:02 AM

## 2013-03-23 NOTE — Progress Notes (Signed)
Physical Therapy Treatment Patient Details Name: Derek Carr MRN: [id number] DOB: Oct 14, 1931 Today's Date: 03/23/2013 Time: 8756-4332 PT Time Calculation (min): 26 min  PT Assessment / Plan / Recommendation  History of Present Illness Pt is s/p R direct anterior THA   PT Comments   Pt with improved cognition today and able to ambulate with assist and cues.  Pt fatigues quickly and required a seated rest break during ambulation after 7 feet.  Pt and family plan for d/c to SNF likely today.   Follow Up Recommendations  SNF;Supervision/Assistance - 24 hour     Does the patient have the potential to tolerate intense rehabilitation     Barriers to Discharge        Equipment Recommendations  Rolling walker with 5" wheels;3in1 (PT);Wheelchair (measurements PT);Hospital bed    Recommendations for Other Services    Frequency 7X/week   Progress towards PT Goals Progress towards PT goals: Progressing toward goals  Plan Current plan remains appropriate    Precautions / Restrictions Precautions Precautions: Fall Restrictions Weight Bearing Restrictions: No Other Position/Activity Restrictions: WBAT   Pertinent Vitals/Pain Pt reports pain in R hip with activity however not rated, activity to tolerance, repositioned    Mobility  Bed Mobility General bed mobility comments: pt up in recliner on arrival Transfers Overall transfer level: Needs assistance Equipment used: Rolling walker (2 wheeled) Transfers: Sit to/from Stand Sit to Stand: Mod assist;+2 physical assistance General transfer comment: verbal cues for safe technique, assist to rise, did better with controlling descent (min/guard) Ambulation/Gait Ambulation/Gait assistance: Mod assist;+2 physical assistance Ambulation Distance (Feet): 17 Feet (total) Assistive device: Rolling walker (2 wheeled) Gait Pattern/deviations: Step-to pattern;Antalgic;Trunk flexed Gait velocity: decr General Gait Details: 7'x1, 10'x1, pt still  requiring multimodal cues but following better today cognitively, still having difficulty advancing R LE so assist provided at first however pt progressed to scooting along floor with foot and min assist for RW maneuvering, great improvement today    Exercises Total Joint Exercises Ankle Circles/Pumps: AROM;Both;15 reps Quad Sets: AROM;Both;15 reps Heel Slides: AAROM;Right;15 reps Hip ABduction/ADduction: AAROM;Right;15 reps Straight Leg Raises: AAROM;Right;15 reps   PT Diagnosis:    PT Problem List:   PT Treatment Interventions:     PT Goals (current goals can now be found in the care plan section)    Visit Information  Last PT Received On: 03/23/13 Assistance Needed: +2 History of Present Illness: Pt is s/p R direct anterior THA    Subjective Data      Cognition  Cognition Arousal/Alertness: Awake/alert Overall Cognitive Status: Impaired/Different from baseline Area of Impairment: Following commands;Safety/judgement;Problem solving Following Commands: Follows one step commands with increased time Safety/Judgement: Decreased awareness of safety Problem Solving: Slow processing;Difficulty sequencing;Requires verbal cues;Requires tactile cues General Comments: improved since yesterday    Balance     End of Session PT - End of Session Equipment Utilized During Treatment: Gait belt Activity Tolerance: Patient tolerated treatment well Patient left: with call bell/phone within reach;in chair;with family/visitor present   GP     Derek Carr,Derek Carr 03/23/2013, 12:28 PM Carmelia Bake, PT, DPT 03/23/2013 Pager: [phone]

## 2013-03-23 NOTE — Progress Notes (Signed)
Report given to [place] at [facility]. Pt going to room 604.

## 2013-03-26 ENCOUNTER — Non-Acute Institutional Stay (SKILLED_NURSING_FACILITY): Payer: Medicare Other | Admitting: Adult Health

## 2013-03-26 ENCOUNTER — Encounter: Payer: Self-pay | Admitting: Adult Health

## 2013-03-26 DIAGNOSIS — K5900 Constipation, unspecified: Secondary | ICD-10-CM

## 2013-03-26 DIAGNOSIS — C61 Malignant neoplasm of prostate: Secondary | ICD-10-CM

## 2013-03-26 DIAGNOSIS — R350 Frequency of micturition: Secondary | ICD-10-CM

## 2013-03-26 DIAGNOSIS — M810 Age-related osteoporosis without current pathological fracture: Secondary | ICD-10-CM

## 2013-03-26 DIAGNOSIS — M1610 Unilateral primary osteoarthritis, unspecified hip: Secondary | ICD-10-CM

## 2013-03-26 DIAGNOSIS — Z96649 Presence of unspecified artificial hip joint: Secondary | ICD-10-CM

## 2013-03-26 DIAGNOSIS — D500 Iron deficiency anemia secondary to blood loss (chronic): Secondary | ICD-10-CM

## 2013-03-26 DIAGNOSIS — E785 Hyperlipidemia, unspecified: Secondary | ICD-10-CM

## 2013-03-26 DIAGNOSIS — M169 Osteoarthritis of hip, unspecified: Secondary | ICD-10-CM

## 2013-03-26 DIAGNOSIS — Z8619 Personal history of other infectious and parasitic diseases: Secondary | ICD-10-CM

## 2013-03-26 MED ORDER — SENNOSIDES-DOCUSATE SODIUM 8.6-50 MG PO TABS: 2.0000 | ORAL_TABLET | Freq: Two times a day (BID) | ORAL | Status: DC

## 2013-03-26 NOTE — Progress Notes (Signed)
Patient ID: Derek Carr, male   DOB: 1932-01-04, 80 y.o.   MRN: [id number]     [facility]  No Known Allergies   Chief Complaint  Patient presents with  . Hospitalization Follow-up    HPI:  He has been hospitalized for a right hip replacement. He is here for short term rehab; with his goal to return home upon completion of rehab. He is complaining of constipation. There are no concerns being voiced by the nursing staff at this time.    Past Medical History  Diagnosis Date  . Hyperlipidemia   . Hypertension   . Erectile dysfunction   . Prostate cancer 05/11/2011    bx=Adenocarcinoma,gleason3+4=7, 4+4=8,PSA=10.30volume=45.7cc  . History of asbestos exposure   . Nocturia   . History of shingles 2012-- BILATERAL EYES--  NO RESIDUAL  . Dermatitis, atopic ARMS AND LEGS  . OA (osteoarthritis) of hip RIGHT  . Arthritis   . Frequency of urination   . Smokers' cough   . Lumbar scoliosis   . History of radiation therapy 8/19-13-10/18/11    prostate, 3 GY  . Glaucoma   . DDD (degenerative disc disease)   . Diverticulosis   . Atherosclerosis     Past Surgical History  Procedure Laterality Date  . Esophageus cyst removal  YRS AGO  . Umbilical hernia repair  1998    epigastric  . Prostate biopsy  05/11/11    Adenocarcinoma (MD OFFICE)  . Cardiac catheterization  02-24-2009  DR NASHER    MINOR CORONARY ARTERY IRREGULARITIES/ NORMAL LVSF/ EF 65-70%  . Attempted left vats/ left thoracotomy/ resection of the enormous, probable bronchogenic cyst  01-04-2005  DR Arlyce Dice  . Repair right inguinal hernia w/ mesh  09-10-1999  . Lumbar spine surgery  1991  . Radioactive seed implant  11/15/2011    Procedure: RADIOACTIVE SEED IMPLANT;  Surgeon: Dutch Gray, MD;  Location: [facility];  Service: Urology;  Laterality: N/A;  Total number of seeds - 52  . Cystoscopy  11/15/2011    Procedure: CYSTOSCOPY;  Surgeon: Dutch Gray, MD;  Location: [facility];   Service: Urology;  Laterality: N/A;  no seeds seen in bladder  . Colonoscopy w/ polypectomy    . Total hip arthroplasty Right 03/20/2013    Procedure: RIGHT TOTAL HIP ARTHROPLASTY ANTERIOR APPROACH;  Surgeon: [name], MD;  Location: [facility];  Service: Orthopedics;  Laterality: Right;    VITAL SIGNS BP 132/74  Pulse 76  Ht 6\' 3"  (1.905 m)  Wt 180 lb (81.647 kg)  BMI 22.50 kg/m2   Patient's Medications  New Prescriptions   No medications on file  Previous Medications   ACETAMINOPHEN (TYLENOL) 500 MG TABLET    Take 2 tablets (1,000 mg total) by mouth every 8 (eight) hours.   ALENDRONATE (FOSAMAX) 70 MG TABLET    Take 70 mg by mouth every 7 (seven) days. Take with a full glass of water on an empty stomach.   ASPIRIN EC 325 MG EC TABLET    Take 1 tablet (325 mg total) by mouth 2 (two) times daily.   BICALUTAMIDE (CASODEX) 50 MG TABLET    Take 50 mg by mouth every morning.    BRIMONIDINE-TIMOLOL (COMBIGAN) 0.2-0.5 % OPHTHALMIC SOLUTION    Place 1 drop into both eyes every 12 (twelve) hours.   CHOLECALCIFEROL (VITAMIN D) 1000 UNITS TABLET    Take 1,000 Units by mouth daily.   DOCUSATE SODIUM 100 MG CAPS    Take  100 mg by mouth 2 (two) times daily.   EZETIMIBE (ZETIA) 10 MG TABLET    Take 10 mg by mouth every morning.    FERROUS SULFATE 325 (65 FE) MG TABLET    Take 1 tablet (325 mg total) by mouth 3 (three) times daily after meals.   FOLIC ACID (FOLVITE) 1 MG TABLET    Take 1 mg by mouth daily.   LEUPROLIDE ACETATE (LUPRON IJ)    Inject as directed every 4 (four) months. Q 4 Months   OVER THE COUNTER MEDICATION    Take 500 mg by mouth daily. Antacid supplement   POLYETHYLENE GLYCOL (MIRALAX / GLYCOLAX) PACKET    Take 17 g by mouth 2 (two) times daily.   PRAVASTATIN (PRAVACHOL) 80 MG TABLET    Take 80 mg by mouth every evening.    SOLIFENACIN (VESICARE) 10 MG TABLET    Take 10 mg by mouth daily.    TIZANIDINE (ZANAFLEX) 4 MG TABLET    Take 1 tablet (4 mg total) by mouth every 6 (six)  hours as needed.   VALACYCLOVIR (VALTREX) 500 MG TABLET    Take 500 mg by mouth 2 (two) times daily.  Modified Medications   No medications on file  Discontinued Medications   No medications on file    SIGNIFICANT DIAGNOSTIC EXAMS  03-15-13: chest x-ray: No acute abnormalities.  03-20-13: pelvic x-ray Patient is status post total right hip arthroplasty.  03-20-13: right hip x-ray: Probable postoperative air within soft tissues overlying the lesser trochanter. A fracture is less likely. Routine views are recommended when possible.    LABS REVIEWED  03-15-13: wbc 2.6 hgb 12.7; hct 38.1; mcv 832. plt 208; glucose 74; bun 16; creat 1.15; k+4.2; na++ 140 03-22-13: wbc 7.6; hgb 10.0; hct 30.1; mcv 82.7;plt 178; glucose 123; bun 15; creat 1.13; k+3.7; na++140     Review of Systems  Constitutional: Negative for malaise/fatigue.  Respiratory: Negative for cough and shortness of breath.   Cardiovascular: Negative for chest pain, palpitations and leg swelling.  Gastrointestinal: Positive for constipation. Negative for heartburn and abdominal pain.  Musculoskeletal: Negative for joint pain and myalgias.       Right hip is sore; pain is being managed   Skin: Negative.   Neurological: Negative for dizziness and weakness.  Psychiatric/Behavioral: Negative for depression. The patient is not nervous/anxious.       Physical Exam  Constitutional: He is oriented to person, place, and time. He appears well-nourished. No distress.  thin  Neck: Neck supple. No JVD present.  Cardiovascular: Normal rate, regular rhythm and intact distal pulses.   Respiratory: Breath sounds normal. No respiratory distress. He has no wheezes.  GI: Soft. Bowel sounds are normal. He exhibits no distension. There is no tenderness.  Musculoskeletal: He exhibits no edema.  Is able to move all extremities; has some limited range of motion to right hip is status post replacement   Neurological: He is alert and oriented to  person, place, and time.  Skin: Skin is dry. He is not diaphoretic.  Incision line without signs of infection present.   Psychiatric: He has a normal mood and affect.      ASSESSMENT/ PLAN:  1. Constipation: is worse; will continue miralax twice daily will stop the colace and will begin senna s 2 tabs twice daily and will monitor  2. Osteoarthritis of right hip: is status post right hip replacement; will continue therapy as directed; will continue tylenol 1 gm three times daily will  continue asa 325 mg twice daily and will continue to monitor his status.   3. Osteoporosis: is stable will continue fosamax 70 mg weekly vit d 1000 units daily and tums 500 mg daily for supplement  4. Prostate cancer: is stable will continue lupron injection every 4 months and casodex 50 mg daily  5. Urinary frequency: will continue vesicare 10 mg daily   6. History of shingles: no recent outbreak is on long term valtrex 500 mg twice daily; will not make changes  7. Hyperlipidemia: will continue pravachol 80 mg daily and zetia 10 mg daily  8. Anemia: will continue iron three times daily and will check cbc this week   9. Glaucoma: will continue combigan to both eyes twice daily     Time spent with patient 50 minutes.      Ok Edwards NP [facility]  Contact [phone] Monday through Friday 8am- 5pm  After hours call [phone]

## 2013-03-29 ENCOUNTER — Encounter: Payer: Self-pay | Admitting: Internal Medicine

## 2013-03-29 ENCOUNTER — Non-Acute Institutional Stay (SKILLED_NURSING_FACILITY): Payer: Medicare Other | Admitting: Internal Medicine

## 2013-03-29 DIAGNOSIS — K5900 Constipation, unspecified: Secondary | ICD-10-CM

## 2013-03-29 DIAGNOSIS — M810 Age-related osteoporosis without current pathological fracture: Secondary | ICD-10-CM

## 2013-03-29 DIAGNOSIS — R32 Unspecified urinary incontinence: Secondary | ICD-10-CM

## 2013-03-29 DIAGNOSIS — M1610 Unilateral primary osteoarthritis, unspecified hip: Secondary | ICD-10-CM

## 2013-03-29 DIAGNOSIS — M169 Osteoarthritis of hip, unspecified: Secondary | ICD-10-CM

## 2013-03-29 DIAGNOSIS — D500 Iron deficiency anemia secondary to blood loss (chronic): Secondary | ICD-10-CM

## 2013-03-29 DIAGNOSIS — C61 Malignant neoplasm of prostate: Secondary | ICD-10-CM

## 2013-03-29 NOTE — Progress Notes (Signed)
Patient ID: Derek Carr, male   DOB: 1931-07-21, 80 y.o.   MRN: [id number]     [facility]   PCP: Jerlyn Ly, MD  Code Status: full code  No Known Allergies  Chief Complaint: new admit  HPI:  80 y/o male patient is here for STR after hospital admission for right hip arthroplasty. He is seen in his room, sitting on his chair. He denies any complaints this visit. His pain is under control. He is working with therapy team. No concern from staff  Review of Systems:  Constitutional: Negative for fever, chills, weight loss, malaise/fatigue and diaphoresis.  HENT: Negative for congestion, hearing loss and sore throat.   Eyes: Negative for eye pain, blurred vision, double vision and discharge.  Respiratory: Negative for cough, sputum production, shortness of breath and wheezing.   Cardiovascular: Negative for chest pain, palpitations, orthopnea and leg swelling.  Gastrointestinal: Negative for heartburn, nausea, vomiting, abdominal pain, diarrhea and constipation.  Genitourinary: Negative for dysuria, urgency, frequency, hematuria and flank pain.  Musculoskeletal: Negative for back pain, falls Skin: Negative for itching and rash.  Neurological: Negative for weakness,dizziness, tingling, focal weakness and headaches.  Psychiatric/Behavioral: Negative for depression and memory loss. The patient is not nervous/anxious.     Past Medical History  Diagnosis Date  . Hyperlipidemia   . Hypertension   . Erectile dysfunction   . Prostate cancer 05/11/2011    bx=Adenocarcinoma,gleason3+4=7, 4+4=8,PSA=10.30volume=45.7cc  . History of asbestos exposure   . Nocturia   . History of shingles 2012-- BILATERAL EYES--  NO RESIDUAL  . Dermatitis, atopic ARMS AND LEGS  . OA (osteoarthritis) of hip RIGHT  . Arthritis   . Frequency of urination   . Smokers' cough   . Lumbar scoliosis   . History of radiation therapy 8/19-13-10/18/11    prostate, 3 GY  . Glaucoma   . DDD  (degenerative disc disease)   . Diverticulosis   . Atherosclerosis    Past Surgical History  Procedure Laterality Date  . Esophageus cyst removal  YRS AGO  . Umbilical hernia repair  1998    epigastric  . Prostate biopsy  05/11/11    Adenocarcinoma (MD OFFICE)  . Cardiac catheterization  02-24-2009  DR NASHER    MINOR CORONARY ARTERY IRREGULARITIES/ NORMAL LVSF/ EF 65-70%  . Attempted left vats/ left thoracotomy/ resection of the enormous, probable bronchogenic cyst  01-04-2005  DR Arlyce Dice  . Repair right inguinal hernia w/ mesh  09-10-1999  . Lumbar spine surgery  1991  . Radioactive seed implant  11/15/2011    Procedure: RADIOACTIVE SEED IMPLANT;  Surgeon: Dutch Gray, MD;  Location: [facility];  Service: Urology;  Laterality: N/A;  Total number of seeds - 52  . Cystoscopy  11/15/2011    Procedure: CYSTOSCOPY;  Surgeon: Dutch Gray, MD;  Location: [facility];  Service: Urology;  Laterality: N/A;  no seeds seen in bladder  . Colonoscopy w/ polypectomy    . Total hip arthroplasty Right 03/20/2013    Procedure: RIGHT TOTAL HIP ARTHROPLASTY ANTERIOR APPROACH;  Surgeon: [name], MD;  Location: [facility];  Service: Orthopedics;  Laterality: Right;   Social History:   reports that he quit smoking about a year ago. His smoking use included Cigarettes. He has a 3 pack-year smoking history. He has never used smokeless tobacco. He reports that he drinks about 0.6 ounces of alcohol per week. He reports that he does not use illicit drugs.  Family History  Problem Relation Age of Onset  . Hypertension Mother   . Prostate cancer Brother     surgery/cured  . Lung cancer Brother     new primary/same brother    Medications: Patient's Medications  New Prescriptions   No medications on file  Previous Medications   ACETAMINOPHEN (TYLENOL) 500 MG TABLET    Take 2 tablets (1,000 mg total) by mouth every 8 (eight) hours.   ALENDRONATE (FOSAMAX) 70 MG TABLET    Take  70 mg by mouth every 7 (seven) days. Take with a full glass of water on an empty stomach.   ASPIRIN EC 325 MG EC TABLET    Take 1 tablet (325 mg total) by mouth 2 (two) times daily.   BICALUTAMIDE (CASODEX) 50 MG TABLET    Take 50 mg by mouth every morning.    BRIMONIDINE-TIMOLOL (COMBIGAN) 0.2-0.5 % OPHTHALMIC SOLUTION    Place 1 drop into both eyes every 12 (twelve) hours.   CHOLECALCIFEROL (VITAMIN D) 1000 UNITS TABLET    Take 1,000 Units by mouth daily.   EZETIMIBE (ZETIA) 10 MG TABLET    Take 10 mg by mouth every morning.    FERROUS SULFATE 325 (65 FE) MG TABLET    Take 1 tablet (325 mg total) by mouth 3 (three) times daily after meals.   FOLIC ACID (FOLVITE) 1 MG TABLET    Take 1 mg by mouth daily.   LEUPROLIDE ACETATE (LUPRON IJ)    Inject as directed every 4 (four) months. Q 4 Months   OVER THE COUNTER MEDICATION    Take 500 mg by mouth daily. Antacid supplement   POLYETHYLENE GLYCOL (MIRALAX / GLYCOLAX) PACKET    Take 17 g by mouth 2 (two) times daily.   PRAVASTATIN (PRAVACHOL) 80 MG TABLET    Take 80 mg by mouth every evening.    SENNA-DOCUSATE (SENOKOT-S) 8.6-50 MG PER TABLET    Take 2 tablets by mouth 2 (two) times daily.   SOLIFENACIN (VESICARE) 10 MG TABLET    Take 10 mg by mouth daily.    TIZANIDINE (ZANAFLEX) 4 MG TABLET    Take 1 tablet (4 mg total) by mouth every 6 (six) hours as needed.   VALACYCLOVIR (VALTREX) 500 MG TABLET    Take 500 mg by mouth 2 (two) times daily.  Modified Medications   No medications on file  Discontinued Medications   No medications on file     Physical Exam: BP 118/72  Pulse 82  Temp(Src) 98.6 F (37 C)  Resp 18  SpO2 96%  General- elderly male in no acute distress Head- atraumatic, normocephalic Eyes- PERRLA, EOMI, no pallor, no icterus, no discharge Neck- no lymphadenopathy, no thyromegaly, no jugular vein distension Cardiovascular- normal s1,s2, no murmurs/ rubs/ gallops Respiratory- bilateral clear to auscultation, no wheeze, no  rhonchi, no crackles, no use of accessory muscles Abdomen- bowel sounds present, soft, non tender Musculoskeletal- able to move all 4 extremities,right hip rom limited, trace right leg edema Neurological- no focal deficit Skin- warm and dry, incision site healing well, dressing clean and dry Psychiatry- alert and oriented to person, place and time, normal mood and affect    Labs reviewed: Basic Metabolic Panel:  Recent Labs  03/15/13 1150 03/21/13 0445 03/22/13 0407  NA 140 138 140  K 4.2 3.9 3.7  CL 105 103 105  CO2 25 21 24   GLUCOSE 74 109* 123*  BUN 16 15 15   CREATININE 1.15 1.06 1.13  CALCIUM 9.9 8.8 9.1  CBC:  Recent Labs  09/09/12 1800 03/15/13 1150 03/21/13 0445 03/22/13 0407  WBC 2.9* 2.6* 6.2 7.6  NEUTROABS 1.6*  --   --   --   HGB 13.0 12.7* 9.7* 10.0*  HCT 40.0 38.1* 29.3* 30.1*  MCV 82.6 83.2 82.3 82.7  PLT 213 208 172 178   Labs from facility- wbc 5.1, hb 8.7, hct 27.3, plt 291  Radiological Exams:  03-15-13: chest x-ray: No acute abnormalities.  03-20-13: pelvic x-ray Patient is status post total right hip arthroplasty.  03-20-13: right hip x-ray: Probable postoperative air within soft tissues overlying the lesser trochanter. A fracture is less likely. Routine views are recommended when possible.    Assessment/Plan  Osteoarthritis of right hips/p right hip replacement. Continue PT and OT, pain medications and zanaflex and aspirin for dvt prophylaxis. Wound care and follow up with orthopedics  Anemia- continue iron three times daily , hb 8.7 on recheck, no signs of bleeding, monitor h/h  Constipation- stable. Continue senna s and miralax  Osteoporosis- stable on fosamax 70 mg weekly with vitamin d   Urinary incontinence- continue oxybutynin for now  Prostate cancer- continue lupron and casodex  Family/ staff Communication: reviewed care plan with patient and nursing supervisor   Goals of care: STR   Labs/tests ordered:  none    Blanchie Serve, MD  [facility] [phone] (Monday-Friday 8 am - 5 pm) [phone] (afterhours)

## 2013-03-30 ENCOUNTER — Non-Acute Institutional Stay (SKILLED_NURSING_FACILITY): Payer: Medicare Other | Admitting: Adult Health

## 2013-03-30 DIAGNOSIS — Z96649 Presence of unspecified artificial hip joint: Secondary | ICD-10-CM

## 2013-03-30 DIAGNOSIS — R350 Frequency of micturition: Secondary | ICD-10-CM

## 2013-03-30 DIAGNOSIS — D500 Iron deficiency anemia secondary to blood loss (chronic): Secondary | ICD-10-CM

## 2013-03-30 DIAGNOSIS — M1610 Unilateral primary osteoarthritis, unspecified hip: Secondary | ICD-10-CM

## 2013-03-30 DIAGNOSIS — M169 Osteoarthritis of hip, unspecified: Secondary | ICD-10-CM

## 2013-04-02 ENCOUNTER — Other Ambulatory Visit: Payer: Self-pay | Admitting: *Deleted

## 2013-04-02 MED ORDER — TRAMADOL HCL 50 MG PO TABS: ORAL_TABLET | ORAL | Status: DC

## 2013-04-02 NOTE — Telephone Encounter (Signed)
[facility] 

## 2013-04-05 ENCOUNTER — Encounter: Payer: Self-pay | Admitting: Adult Health

## 2013-04-05 NOTE — Progress Notes (Signed)
Patient ID: Derek Carr, male   DOB: 1932-01-25, 80 y.o.   MRN: [id number]     [facility]  No Known Allergies   Chief Complaint  Patient presents with  . Acute Visit    family concerns    HPI:  His family was concerned that yesterday he was tired and looked like he had "jaundice" present. We had a prolonged discussion on how to look for jaundice. He tells me that his energy is improved today and that he is feeling good His family is concerned that he is not getting adequate pain relief during therapy. We talked about his pain medication and his management. We have decided upon using ultram on a routine basis as he is reluctant to ask for medications.   Past Medical History  Diagnosis Date  . Hyperlipidemia   . Hypertension   . Erectile dysfunction   . Prostate cancer 05/11/2011    bx=Adenocarcinoma,gleason3+4=7, 4+4=8,PSA=10.30volume=45.7cc  . History of asbestos exposure   . Nocturia   . History of shingles 2012-- BILATERAL EYES--  NO RESIDUAL  . Dermatitis, atopic ARMS AND LEGS  . OA (osteoarthritis) of hip RIGHT  . Arthritis   . Frequency of urination   . Smokers' cough   . Lumbar scoliosis   . History of radiation therapy 8/19-13-10/18/11    prostate, 44 GY  . Glaucoma   . DDD (degenerative disc disease)   . Diverticulosis   . Atherosclerosis     Past Surgical History  Procedure Laterality Date  . Esophageus cyst removal  YRS AGO  . Umbilical hernia repair  1998    epigastric  . Prostate biopsy  05/11/11    Adenocarcinoma (MD OFFICE)  . Cardiac catheterization  02-24-2009  DR NASHER    MINOR CORONARY ARTERY IRREGULARITIES/ NORMAL LVSF/ EF 65-70%  . Attempted left vats/ left thoracotomy/ resection of the enormous, probable bronchogenic cyst  01-04-2005  DR Arlyce Dice  . Repair right inguinal hernia w/ mesh  09-10-1999  . Lumbar spine surgery  1991  . Radioactive seed implant  11/15/2011    Procedure: RADIOACTIVE SEED IMPLANT;  Surgeon: Dutch Gray, MD;   Location: [facility];  Service: Urology;  Laterality: N/A;  Total number of seeds - 52  . Cystoscopy  11/15/2011    Procedure: CYSTOSCOPY;  Surgeon: Dutch Gray, MD;  Location: [facility];  Service: Urology;  Laterality: N/A;  no seeds seen in bladder  . Colonoscopy w/ polypectomy    . Total hip arthroplasty Right 03/20/2013    Procedure: RIGHT TOTAL HIP ARTHROPLASTY ANTERIOR APPROACH;  Surgeon: [name], MD;  Location: [facility];  Service: Orthopedics;  Laterality: Right;    VITAL SIGNS BP 118/79  Pulse 80  Ht 6\' 3"  (1.905 m)  Wt 180 lb (81.647 kg)  BMI 22.50 kg/m2   Patient's Medications  New Prescriptions   No medications on file  Previous Medications   ACETAMINOPHEN (TYLENOL) 500 MG TABLET    Take 2 tablets (1,000 mg total) by mouth every 8 (eight) hours.   ALENDRONATE (FOSAMAX) 70 MG TABLET    Take 70 mg by mouth every 7 (seven) days. Take with a full glass of water on an empty stomach.   ASPIRIN EC 325 MG EC TABLET    Take 1 tablet (325 mg total) by mouth 2 (two) times daily.   BICALUTAMIDE (CASODEX) 50 MG TABLET    Take 50 mg by mouth every morning.    BRIMONIDINE-TIMOLOL (COMBIGAN) 0.2-0.5 %  OPHTHALMIC SOLUTION    Place 1 drop into both eyes every 12 (twelve) hours.   CHOLECALCIFEROL (VITAMIN D) 1000 UNITS TABLET    Take 1,000 Units by mouth daily.   EZETIMIBE (ZETIA) 10 MG TABLET    Take 10 mg by mouth every morning.    FERROUS SULFATE 325 (65 FE) MG TABLET    Take 1 tablet (325 mg total) by mouth 3 (three) times daily after meals.   FOLIC ACID (FOLVITE) 1 MG TABLET    Take 1 mg by mouth daily.   LEUPROLIDE ACETATE (LUPRON IJ)    Inject as directed every 4 (four) months. Q 4 Months   METHOCARBAMOL (ROBAXIN) 500 MG TABLET    Take 500 mg by mouth 4 (four) times daily.   OVER THE COUNTER MEDICATION    Take 500 mg by mouth daily. Antacid supplement   OXYBUTYNIN (DITROPAN-XL) 10 MG 24 HR TABLET    Take 10 mg by mouth at bedtime.   POLYETHYLENE  GLYCOL (MIRALAX / GLYCOLAX) PACKET    Take 17 g by mouth 2 (two) times daily.   PRAVASTATIN (PRAVACHOL) 80 MG TABLET    Take 80 mg by mouth every evening.    SENNA-DOCUSATE (SENOKOT-S) 8.6-50 MG PER TABLET    Take 2 tablets by mouth 2 (two) times daily.   TRAMADOL (ULTRAM) 50 MG TABLET    Take one tablet by mouth every six hours prn pain   VALACYCLOVIR (VALTREX) 500 MG TABLET    Take 500 mg by mouth 2 (two) times daily.  Modified Medications   No medications on file  Discontinued Medications   SOLIFENACIN (VESICARE) 10 MG TABLET    Take 10 mg by mouth daily.    TIZANIDINE (ZANAFLEX) 4 MG TABLET    Take 1 tablet (4 mg total) by mouth every 6 (six) hours as needed.    SIGNIFICANT DIAGNOSTIC EXAMS  03-15-13: chest x-ray: No acute abnormalities.  03-20-13: pelvic x-ray Patient is status post total right hip arthroplasty.  03-20-13: right hip x-ray: Probable postoperative air within soft tissues overlying the lesser trochanter. A fracture is less likely. Routine views are recommended when possible.    LABS REVIEWED  03-15-13: wbc 2.6 hgb 12.7; hct 38.1; mcv 832. plt 208; glucose 74; bun 16; creat 1.15; k+4.2; na++ 140 03-22-13: wbc 7.6; hgb 10.0; hct 30.1; mcv 82.7;plt 178; glucose 123; bun 15; creat 1.13; k+3.7; na++140 03-27-13: wbc 5.1; hgb 8.7; hct 27.3; mcv 82.2; plt 291      Review of Systems  Constitutional: Negative for malaise/fatigue.  Respiratory: Negative for cough and shortness of breath.   Cardiovascular: Negative for chest pain, palpitations and leg swelling.  Gastrointestinal:negative for constipation. Negative for heartburn and abdominal pain.  Musculoskeletal: Negative for joint pain and myalgias.       Right hip is sore; Skin: Negative.   Neurological: Negative for dizziness and weakness.  Psychiatric/Behavioral: Negative for depression. The patient is not nervous/anxious.       Physical Exam  Constitutional: He is oriented to person, place, and time. He appears  well-nourished. No distress.  thin  Neck: Neck supple. No JVD present.  Cardiovascular: Normal rate, regular rhythm and intact distal pulses.   Respiratory: Breath sounds normal. No respiratory distress. He has no wheezes.  GI: Soft. Bowel sounds are normal. He exhibits no distension. There is no tenderness.  Musculoskeletal: He exhibits no edema.  Is able to move all extremities; has some limited range of motion to right hip is status  post replacement   Neurological: He is alert and oriented to person, place, and time.  Skin: Skin is dry. He is not diaphoretic.  Incision line without signs of infection present.   Psychiatric: He has a normal mood and affect.       ASSESSMENT/ PLAN:  1. Anemia: will repeat cbc this coming Monday and will monitor his status; will continue iron supplement   2. Urinary frequency: will stop ditropan and will monitor his status.   3. Osteoarthritis of hip: is status post right hip replacement will change the tylenol to every 8 hours as needed; and will make the ultram 50 mg every 6 hours routinely and will monitor his status.     Time spent with patient 45 minutes.       Ok Edwards NP [facility]  Contact [phone] Monday through Friday 8am- 5pm  After hours call [phone]

## 2013-04-08 ENCOUNTER — Emergency Department (HOSPITAL_COMMUNITY): Payer: Medicare Other

## 2013-04-08 ENCOUNTER — Emergency Department (HOSPITAL_COMMUNITY)
Admission: EM | Admit: 2013-04-08 | Discharge: 2013-04-08 | Disposition: A | Discharge status: Home / Self Care | Payer: Medicare Other | Attending: Emergency Medicine | Admitting: Emergency Medicine

## 2013-04-08 ENCOUNTER — Encounter (HOSPITAL_COMMUNITY): Payer: Self-pay | Admitting: Emergency Medicine

## 2013-04-08 DIAGNOSIS — Z872 Personal history of diseases of the skin and subcutaneous tissue: Secondary | ICD-10-CM | POA: Insufficient documentation

## 2013-04-08 DIAGNOSIS — Z8546 Personal history of malignant neoplasm of prostate: Secondary | ICD-10-CM | POA: Insufficient documentation

## 2013-04-08 DIAGNOSIS — M1610 Unilateral primary osteoarthritis, unspecified hip: Secondary | ICD-10-CM | POA: Insufficient documentation

## 2013-04-08 DIAGNOSIS — Z96649 Presence of unspecified artificial hip joint: Secondary | ICD-10-CM | POA: Insufficient documentation

## 2013-04-08 DIAGNOSIS — Z87448 Personal history of other diseases of urinary system: Secondary | ICD-10-CM | POA: Insufficient documentation

## 2013-04-08 DIAGNOSIS — Z87891 Personal history of nicotine dependence: Secondary | ICD-10-CM | POA: Insufficient documentation

## 2013-04-08 DIAGNOSIS — F29 Unspecified psychosis not due to a substance or known physiological condition: Secondary | ICD-10-CM | POA: Insufficient documentation

## 2013-04-08 DIAGNOSIS — Z7982 Long term (current) use of aspirin: Secondary | ICD-10-CM | POA: Insufficient documentation

## 2013-04-08 DIAGNOSIS — I10 Essential (primary) hypertension: Secondary | ICD-10-CM | POA: Insufficient documentation

## 2013-04-08 DIAGNOSIS — Z8619 Personal history of other infectious and parasitic diseases: Secondary | ICD-10-CM | POA: Insufficient documentation

## 2013-04-08 DIAGNOSIS — R410 Disorientation, unspecified: Secondary | ICD-10-CM

## 2013-04-08 DIAGNOSIS — Z8669 Personal history of other diseases of the nervous system and sense organs: Secondary | ICD-10-CM | POA: Insufficient documentation

## 2013-04-08 DIAGNOSIS — Z79899 Other long term (current) drug therapy: Secondary | ICD-10-CM | POA: Insufficient documentation

## 2013-04-08 DIAGNOSIS — Z9889 Other specified postprocedural states: Secondary | ICD-10-CM | POA: Insufficient documentation

## 2013-04-08 DIAGNOSIS — E785 Hyperlipidemia, unspecified: Secondary | ICD-10-CM | POA: Insufficient documentation

## 2013-04-08 DIAGNOSIS — Z8719 Personal history of other diseases of the digestive system: Secondary | ICD-10-CM | POA: Insufficient documentation

## 2013-04-08 DIAGNOSIS — Z923 Personal history of irradiation: Secondary | ICD-10-CM | POA: Insufficient documentation

## 2013-04-08 DIAGNOSIS — M169 Osteoarthritis of hip, unspecified: Secondary | ICD-10-CM | POA: Insufficient documentation

## 2013-04-08 LAB — URINALYSIS, ROUTINE W REFLEX MICROSCOPIC
BILIRUBIN URINE: NEGATIVE
Glucose, UA: NEGATIVE mg/dL
Hgb urine dipstick: NEGATIVE
Ketones, ur: NEGATIVE mg/dL
Leukocytes, UA: NEGATIVE
Nitrite: NEGATIVE
PROTEIN: NEGATIVE mg/dL
Specific Gravity, Urine: 1.015 (ref 1.005–1.030)
UROBILINOGEN UA: 1 mg/dL (ref 0.0–1.0)
pH: 6 (ref 5.0–8.0)

## 2013-04-08 LAB — COMPREHENSIVE METABOLIC PANEL
ALK PHOS: 159 U/L — AB (ref 39–117)
ALT: 22 U/L (ref 0–53)
AST: 18 U/L (ref 0–37)
Albumin: 2.9 g/dL — ABNORMAL LOW (ref 3.5–5.2)
BUN: 14 mg/dL (ref 6–23)
CALCIUM: 9.3 mg/dL (ref 8.4–10.5)
CO2: 24 meq/L (ref 19–32)
Chloride: 101 mEq/L (ref 96–112)
Creatinine, Ser: 1.16 mg/dL (ref 0.50–1.35)
GFR, EST AFRICAN AMERICAN: 67 mL/min — AB (ref >90)
GFR, EST NON AFRICAN AMERICAN: 58 mL/min — AB (ref >90)
Glucose, Bld: 99 mg/dL (ref 70–99)
POTASSIUM: 4.4 meq/L (ref 3.7–5.3)
Sodium: 137 mEq/L (ref 137–147)
Total Bilirubin: 0.2 mg/dL — ABNORMAL LOW (ref 0.3–1.2)
Total Protein: 8 g/dL (ref 6.0–8.3)

## 2013-04-08 LAB — CBC
HCT: 29.8 % — ABNORMAL LOW (ref 39.0–52.0)
HEMOGLOBIN: 9.8 g/dL — AB (ref 13.0–17.0)
MCH: 27.1 pg (ref 26.0–34.0)
MCHC: 32.9 g/dL (ref 30.0–36.0)
MCV: 82.5 fL (ref 78.0–100.0)
Platelets: 427 10*3/uL — ABNORMAL HIGH (ref 150–400)
RBC: 3.61 MIL/uL — AB (ref 4.22–5.81)
RDW: 15.1 % (ref 11.5–15.5)
WBC: 3.5 10*3/uL — ABNORMAL LOW (ref 4.0–10.5)

## 2013-04-08 LAB — CBG MONITORING, ED: Glucose-Capillary: 84 mg/dL (ref 70–99)

## 2013-04-08 NOTE — ED Notes (Signed)
Daughter and wife at bedside. Stated that pt fell in hospital after surgery and later found out that he had a R femur fracture as a result. Daughter wants CT of head done because of confusion and possibly liver enzymes checked and hgb. Dr. Darl Householder made aware.

## 2013-04-08 NOTE — ED Notes (Signed)
[facility] called for transport.  

## 2013-04-08 NOTE — ED Provider Notes (Signed)
CSN: [id number]     Arrival date & time 04/08/13  1515 History   First MD Initiated Contact with Patient 04/08/13 1523     Chief Complaint  Patient presents with  . Altered Mental Status     (Consider location/radiation/quality/duration/timing/severity/associated sxs/prior Treatment) The history is provided by the patient and the EMS personnel.  MATAN STEEN is a 80 y.o. male hx of HL, HTN, L hip replacement 2 weeks ago here with intermittent confusion. He was started on tramadol for pain after the hip replacement. He states that he sometimes gets confused after taking the combo especially at night. Denies any other medication changes. Denies any fevers or chills or cough or vomiting.    Past Medical History  Diagnosis Date  . Hyperlipidemia   . Hypertension   . Erectile dysfunction   . Prostate cancer 05/11/2011    bx=Adenocarcinoma,gleason3+4=7, 4+4=8,PSA=10.30volume=45.7cc  . History of asbestos exposure   . Nocturia   . History of shingles 2012-- BILATERAL EYES--  NO RESIDUAL  . Dermatitis, atopic ARMS AND LEGS  . OA (osteoarthritis) of hip RIGHT  . Arthritis   . Frequency of urination   . Smokers' cough   . Lumbar scoliosis   . History of radiation therapy 8/19-13-10/18/11    prostate, 42 GY  . Glaucoma   . DDD (degenerative disc disease)   . Diverticulosis   . Atherosclerosis    Past Surgical History  Procedure Laterality Date  . Esophageus cyst removal  YRS AGO  . Umbilical hernia repair  1998    epigastric  . Prostate biopsy  05/11/11    Adenocarcinoma (MD OFFICE)  . Cardiac catheterization  02-24-2009  DR NASHER    MINOR CORONARY ARTERY IRREGULARITIES/ NORMAL LVSF/ EF 65-70%  . Attempted left vats/ left thoracotomy/ resection of the enormous, probable bronchogenic cyst  01-04-2005  DR Arlyce Dice  . Repair right inguinal hernia w/ mesh  09-10-1999  . Lumbar spine surgery  1991  . Radioactive seed implant  11/15/2011    Procedure: RADIOACTIVE SEED IMPLANT;   Surgeon: Dutch Gray, MD;  Location: [facility];  Service: Urology;  Laterality: N/A;  Total number of seeds - 52  . Cystoscopy  11/15/2011    Procedure: CYSTOSCOPY;  Surgeon: Dutch Gray, MD;  Location[facility];  Service: Urology;  Laterality: N/A;  no seeds seen in bladder  . Colonoscopy w/ polypectomy    . Total hip arthroplasty Right 03/20/2013    Procedure: RIGHT TOTAL HIP ARTHROPLASTY ANTERIOR APPROACH;  Surgeon: [name], MD;  Location: [facility];  Service: Orthopedics;  Laterality: Right;   Family History  Problem Relation Age of Onset  . Hypertension Mother   . Prostate cancer Brother     surgery/cured  . Lung cancer Brother     new primary/same brother   History  Substance Use Topics  . Smoking status: Former Smoker -- 0.10 packs/day for 30 years    Types: Cigarettes    Quit date: 03/15/2012  . Smokeless tobacco: Never Used     Comment: HAS CUT DOWN SMOKING FROM 1PPD TO 1PPWK  . Alcohol Use: 0.6 oz/week    1 Cans of beer per week     Comment: rare    Review of Systems  Psychiatric/Behavioral: Positive for confusion.  All other systems reviewed and are negative.      Allergies  Review of patient's allergies indicates no known allergies.  Home Medications   Current Outpatient Rx  Name  Route  Sig  Dispense  Refill  . acetaminophen (TYLENOL) 500 MG tablet   Oral   Take 2 tablets (1,000 mg total) by mouth every 8 (eight) hours.   30 tablet   0   . alendronate (FOSAMAX) 70 MG tablet   Oral   Take 70 mg by mouth every 7 (seven) days. Take with a full glass of water on an empty stomach. Every Sunday         . aspirin EC 325 MG EC tablet   Oral   Take 1 tablet (325 mg total) by mouth 2 (two) times daily.   60 tablet   0     Take for 4 weeks, then return to normal dose.   . bicalutamide (CASODEX) 50 MG tablet   Oral   Take 50 mg by mouth every morning.          . brimonidine-timolol (COMBIGAN) 0.2-0.5 % ophthalmic  solution   Both Eyes   Place 1 drop into both eyes every 12 (twelve) hours.         . cholecalciferol (VITAMIN D) 1000 UNITS tablet   Oral   Take 1,000 Units by mouth daily.         Marland Kitchen docusate sodium (COLACE) 100 MG capsule   Oral   Take 100 mg by mouth 2 (two) times daily as needed for mild constipation.         Marland Kitchen ezetimibe (ZETIA) 10 MG tablet   Oral   Take 10 mg by mouth every morning.          . ferrous sulfate 325 (65 FE) MG tablet   Oral   Take 1 tablet (325 mg total) by mouth 3 (three) times daily after meals.      3     Take for 2-3 weeks.   . folic acid (FOLVITE) 1 MG tablet   Oral   Take 1 mg by mouth daily.         Marland Kitchen OVER THE COUNTER MEDICATION   Oral   Take 500 mg by mouth daily. Antacid supplement         . polyethylene glycol (MIRALAX / GLYCOLAX) packet   Oral   Take 17 g by mouth 2 (two) times daily.   14 each   0   . pravastatin (PRAVACHOL) 80 MG tablet   Oral   Take 80 mg by mouth every evening.          . solifenacin (VESICARE) 10 MG tablet   Oral   Take 10 mg by mouth at bedtime.         Marland Kitchen tiZANidine (ZANAFLEX) 4 MG tablet   Oral   Take 4 mg by mouth every 6 (six) hours as needed for muscle spasms.         . valACYclovir (VALTREX) 500 MG tablet   Oral   Take 500 mg by mouth 2 (two) times daily.         Marland Kitchen Leuprolide Acetate (LUPRON IJ)   Injection   Inject as directed every 4 (four) months. Q 4 Months         . methocarbamol (ROBAXIN) 500 MG tablet   Oral   Take 500 mg by mouth 4 (four) times daily.          BP 177/87  Pulse 65  Temp(Src) 97.8 F (36.6 C) (Oral)  Resp 18  SpO2 97% Physical Exam  Nursing note and vitals reviewed. Constitutional: He is oriented to  person, place, and time. He appears well-developed and well-nourished.  Alert, NAD   HENT:  Head: Normocephalic.  Mouth/Throat: Oropharynx is clear and moist.  Eyes: Conjunctivae are normal. Pupils are equal, round, and reactive to light.   Neck: Normal range of motion. Neck supple.  Cardiovascular: Normal rate, regular rhythm and normal heart sounds.   Pulmonary/Chest: Effort normal.  ? Crackles R base   Abdominal: Soft. Bowel sounds are normal. He exhibits no distension. There is no tenderness. There is no rebound and no guarding.  Musculoskeletal: Normal range of motion.  Dec ROM L hip (normal as per patient after hip replacement)   Neurological: He is alert and oriented to person, place, and time.  Alert, not confused. CN 2-12 intact. Nl strength throughout (except RLE due to pain)   Skin: Skin is warm and dry.  Psychiatric: He has a normal mood and affect. His behavior is normal. Judgment and thought content normal.    ED Course  Procedures (including critical care time) Labs Review Labs Reviewed  CBC - Abnormal; Notable for the following:    WBC 3.5 (*)    RBC 3.61 (*)    Hemoglobin 9.8 (*)    HCT 29.8 (*)    Platelets 427 (*)    All other components within normal limits  COMPREHENSIVE METABOLIC PANEL - Abnormal; Notable for the following:    Albumin 2.9 (*)    Alkaline Phosphatase 159 (*)    Total Bilirubin 0.2 (*)    GFR calc non Af Amer 58 (*)    GFR calc Af Amer 67 (*)    All other components within normal limits  URINALYSIS, ROUTINE W REFLEX MICROSCOPIC  CBG MONITORING, ED   Imaging Review Ct Head Wo Contrast  04/08/2013   CLINICAL DATA:  Altered mental status ; recent trauma  EXAM: CT HEAD WITHOUT CONTRAST  TECHNIQUE: Contiguous axial images were obtained from the base of the skull through the vertex without intravenous contrast. Study was obtained within 24 hr of patient's arrival at the emergency department.  COMPARISON:  May 10, 2012  FINDINGS: There is mild diffuse atrophy. There is no mass, hemorrhage, extra-axial fluid collection, or midline shift. There is mild small vessel disease in the centra semiovale bilaterally. Elsewhere gray-white compartments appear normal. There is no acute appearing  infarct.  Bony calvarium appears intact. Mastoids on the left are clear. There is opacification of multiple mastoid air cells on the right. There is patchy paranasal sinus disease in the left sphenoid sinus as well as in the posterior ethmoid air cells bilaterally.  IMPRESSION: Mild atrophy with mild periventricular small vessel disease. No intracranial mass, hemorrhage, or acute appearing infarct. There is chronic right-sided mastoid disease. There is patchy paranasal sinus disease.   Electronically Signed   By: Lowella Grip M.D.   On: 04/08/2013 17:08   Dg Chest Portable 1 View  04/08/2013   CLINICAL DATA:  Altered mental status  EXAM: PORTABLE CHEST - 1 VIEW  COMPARISON:  03/15/2013  FINDINGS: Postsurgical changes are again noted on the left. The lungs are clear. The cardiac shadow is stable. No bony abnormality is seen.  IMPRESSION: No acute abnormality noted.   Electronically Signed   By: Inez Catalina M.D.   On: 04/08/2013 15:50     EKG Interpretation None      MDM   Final diagnoses:  None   JERONIMO ROLL is a 80 y.o. male here with intermittent confusion. Likely medication related. Will check labs,  UA, CXR to r/o infection.   5:27 PM Daughter states that he fell while he was in the hospital. CT head obtained and showed no bleed. Labs at baseline. WBC nl. UA and CXR nl. Likely medication induced confusion. I discussed with family and they want to stop tramadol for now.    Wandra Arthurs, MD 04/08/13 1728

## 2013-04-08 NOTE — ED Notes (Signed)
Bed: WA20 Expected date:  Expected time:  Means of arrival:  Comments: confusion

## 2013-04-08 NOTE — Discharge Instructions (Signed)
Continue taking tylenol as needed for pain.   Stop tramadol for now. If you have a lot of pain, you should call the doctor at the facility to get a dose of tramadol.   Follow up with your doctor.   Return to ER if you have worse confusion, fever, severe pain.

## 2013-04-08 NOTE — ED Notes (Signed)
Pt is currently living at [facility] place for rehab after R hip replacement and has recently started having intermittent confusion. Afebrile. Confusion correlates with time that he started taking tramadol approx. 1 week ago. Normally no dementia, alert and oriented.

## 2013-04-18 ENCOUNTER — Encounter (HOSPITAL_COMMUNITY): Payer: Self-pay | Admitting: Pharmacy Technician

## 2013-04-18 ENCOUNTER — Encounter (HOSPITAL_COMMUNITY): Payer: Self-pay | Admitting: *Deleted

## 2013-04-18 NOTE — Progress Notes (Signed)
Surgery on 04/23/13.  Need orders in EPIC.  Thank You.

## 2013-04-19 NOTE — Progress Notes (Signed)
Surgery on 04/23/13.  Need orders in EPIC.  Thank You.  

## 2013-04-20 ENCOUNTER — Non-Acute Institutional Stay (SKILLED_NURSING_FACILITY): Payer: Medicare Other | Admitting: Adult Health

## 2013-04-20 DIAGNOSIS — Z96649 Presence of unspecified artificial hip joint: Secondary | ICD-10-CM

## 2013-04-20 DIAGNOSIS — D500 Iron deficiency anemia secondary to blood loss (chronic): Secondary | ICD-10-CM

## 2013-04-20 DIAGNOSIS — M1610 Unilateral primary osteoarthritis, unspecified hip: Secondary | ICD-10-CM

## 2013-04-20 DIAGNOSIS — M169 Osteoarthritis of hip, unspecified: Secondary | ICD-10-CM

## 2013-04-23 NOTE — Progress Notes (Signed)
Patient ID: Derek Carr, male   DOB: [birth year], 80 y.o.   MRN: [id number]     [facility]  No Known Allergies   Chief Complaint  Patient presents with  . Discharge Note    HPI:  He is being discharged to home with home health for pt/ot/rn/aid. He will need a front wheel walker; and 3:1 commode. He will need prescriptions to be written.    Past Medical History  Diagnosis Date  . Hyperlipidemia   . Hypertension   . Erectile dysfunction   . Prostate cancer 05/11/2011    bx=Adenocarcinoma,gleason3+4=7, 4+4=8,PSA=10.30volume=45.7cc  . History of asbestos exposure   . Nocturia   . History of shingles 2012-- BILATERAL EYES--  NO RESIDUAL  . Dermatitis, atopic ARMS AND LEGS  . OA (osteoarthritis) of hip RIGHT  . Arthritis   . Frequency of urination   . Smokers' cough   . Lumbar scoliosis   . History of radiation therapy 8/19-13-10/18/11    prostate, 27 GY  . Glaucoma   . DDD (degenerative disc disease)   . Diverticulosis   . Atherosclerosis   . Anemia     Past Surgical History  Procedure Laterality Date  . Esophageus cyst removal  YRS AGO  . Umbilical hernia repair  1998    epigastric  . Prostate biopsy  05/11/11    Adenocarcinoma (MD OFFICE)  . Cardiac catheterization  02-24-2009  DR NASHER    MINOR CORONARY ARTERY IRREGULARITIES/ NORMAL LVSF/ EF 65-70%  . Attempted left vats/ left thoracotomy/ resection of the enormous, probable bronchogenic cyst  01-04-2005  DR Arlyce Dice  . Repair right inguinal hernia w/ mesh  09-10-1999  . Lumbar spine surgery  1991  . Radioactive seed implant  11/15/2011    Procedure: RADIOACTIVE SEED IMPLANT;  Surgeon: Dutch Gray, MD;  Location: [facility];  Service: Urology;  Laterality: N/A;  Total number of seeds - 52  . Cystoscopy  11/15/2011    Procedure: CYSTOSCOPY;  Surgeon: Dutch Gray, MD;  Location: [facility];  Service: Urology;  Laterality: N/A;  no seeds seen in bladder  . Colonoscopy w/ polypectomy     . Total hip arthroplasty Right 03/20/2013    Procedure: RIGHT TOTAL HIP ARTHROPLASTY ANTERIOR APPROACH;  Surgeon: [name], MD;  Location: [facility];  Service: Orthopedics;  Laterality: Right;    VITAL SIGNS BP 148/64  Pulse 78  Ht 6\' 3"  (1.905 m)  Wt 176 lb (79.833 kg)  BMI 22.00 kg/m2  Patient's Medications  New Prescriptions   No medications on file  Previous Medications   ACETAMINOPHEN (TYLENOL) 500 MG TABLET    Take 2 tablets (1,000 mg total) by mouth every 8 (eight) hours.   ALENDRONATE (FOSAMAX) 70 MG TABLET    Take 70 mg by mouth every 7 (seven) days. Take with a full glass of water on an empty stomach.   ASPIRIN EC 325 MG EC TABLET    Take 1 tablet (325 mg total) by mouth 2 (two) times daily.   BICALUTAMIDE (CASODEX) 50 MG TABLET    Take 50 mg by mouth every morning.    BRIMONIDINE-TIMOLOL (COMBIGAN) 0.2-0.5 % OPHTHALMIC SOLUTION    Place 1 drop into both eyes every 12 (twelve) hours.   CHOLECALCIFEROL (VITAMIN D) 1000 UNITS TABLET    Take 1,000 Units by mouth daily.   EZETIMIBE (ZETIA) 10 MG TABLET    Take 10 mg by mouth every morning.    FERROUS SULFATE 325 (65  FE) MG TABLET    Take 1 tablet (325 mg total) by mouth 3 (three) times daily after meals.   FOLIC ACID (FOLVITE) 1 MG TABLET    Take 1 mg by mouth daily.   LEUPROLIDE ACETATE (LUPRON IJ)    Inject as directed every 4 (four) months. Q 4 Months   METHOCARBAMOL (ROBAXIN) 500 MG TABLET    Take 500 mg by mouth 4 (four) times daily.   OVER THE COUNTER MEDICATION    Take 500 mg by mouth daily. Antacid supplement   OXYBUTYNIN (DITROPAN-XL) 10 MG 24 HR TABLET    Take 10 mg by mouth at bedtime.   POLYETHYLENE GLYCOL (MIRALAX / GLYCOLAX) PACKET    Take 17 g by mouth 2 (two) times daily.   PRAVASTATIN (PRAVACHOL) 80 MG TABLET    Take 80 mg by mouth every evening.    SENNA-DOCUSATE (SENOKOT-S) 8.6-50 MG PER TABLET    Take 2 tablets by mouth 2 (two) times daily.   TRAMADOL (ULTRAM) 50 MG TABLET    Take one tablet by mouth  every six hours prn pain   VALACYCLOVIR (VALTREX) 500 MG TABLET    Take 500 mg by mouth 2 (two) times daily.  Modified Medications   No medications on file  Discontinued Medications   SOLIFENACIN (VESICARE) 10 MG TABLET    Take 10 mg by mouth daily.    TIZANIDINE (ZANAFLEX) 4 MG TABLET    Take 1 tablet (4 mg total) by mouth every 6 (six) hours as needed.    SIGNIFICANT DIAGNOSTIC EXAMS  03-15-13: chest x-ray: No acute abnormalities.  03-20-13: pelvic x-ray Patient is status post total right hip arthroplasty.  03-20-13: right hip x-ray: Probable postoperative air within soft tissues overlying the lesser trochanter. A fracture is less likely. Routine views are recommended when possible.    LABS REVIEWED  03-15-13: wbc 2.6 hgb 12.7; hct 38.1; mcv 832. plt 208; glucose 74; bun 16; creat 1.15; k+4.2; na++ 140 03-22-13: wbc 7.6; hgb 10.0; hct 30.1; mcv 82.7;plt 178; glucose 123; bun 15; creat 1.13; k+3.7; na++140 03-27-13: wbc 5.1; hgb 8.7; hct 27.3; mcv 82.2; plt 291  04-02-13: wbc 5.3; hgb 8.8; hct 28.6 ;mcv 84.9; plt 472      Review of Systems  Constitutional: Negative for malaise/fatigue.  Respiratory: Negative for cough and shortness of breath.   Cardiovascular: Negative for chest pain, palpitations and leg swelling.  Gastrointestinal:negative for constipation. Negative for heartburn and abdominal pain.  Musculoskeletal: Negative for joint pain and myalgias.  Skin: Negative.   Neurological: Negative for dizziness and weakness.  Psychiatric/Behavioral: Negative for depression. The patient is not nervous/anxious.       Physical Exam  Constitutional: He is oriented to person, place, and time. He appears well-nourished. No distress.  thin  Neck: Neck supple. No JVD present.  Cardiovascular: Normal rate, regular rhythm and intact distal pulses.   Respiratory: Breath sounds normal. No respiratory distress. He has no wheezes.  GI: Soft. Bowel sounds are normal. He exhibits no  distension. There is no tenderness.  Musculoskeletal: He exhibits no edema.  Is able to move all extremities; Neurological: He is alert and oriented to person, place, and time.  Skin: Skin is dry. He is not diaphoretic.   Psychiatric: He has a normal mood and affect.      ASSESSMENT/ PLAN:  Will discharge him to home with home health for pt/ot/rn/aid. He will need a front wheel walker and 3;1 commode. His prescriptions have been written.  Time spent with patient 40 minutes.       Ok Edwards NP [facility]  Contact [phone] Monday through Friday 8am- 5pm  After hours call [phone]

## 2013-05-07 ENCOUNTER — Encounter (HOSPITAL_COMMUNITY)
Admission: RE | Admit: 2013-05-07 | Discharge: 2013-05-07 | Disposition: A | Discharge status: Home / Self Care | Payer: Medicare Other | Source: Ambulatory Visit | Attending: Orthopedic Surgery | Admitting: Orthopedic Surgery

## 2013-05-07 ENCOUNTER — Encounter (HOSPITAL_COMMUNITY): Payer: Self-pay

## 2013-05-07 ENCOUNTER — Encounter (INDEPENDENT_AMBULATORY_CARE_PROVIDER_SITE_OTHER): Payer: Self-pay

## 2013-05-07 ENCOUNTER — Encounter (HOSPITAL_COMMUNITY): Payer: Self-pay | Admitting: Pharmacy Technician

## 2013-05-07 DIAGNOSIS — Z01812 Encounter for preprocedural laboratory examination: Secondary | ICD-10-CM | POA: Insufficient documentation

## 2013-05-07 DIAGNOSIS — Z9660 Presence of unspecified orthopedic joint implant: Secondary | ICD-10-CM

## 2013-05-07 DIAGNOSIS — T84049A Periprosthetic fracture around unspecified internal prosthetic joint, initial encounter: Secondary | ICD-10-CM | POA: Insufficient documentation

## 2013-05-07 DIAGNOSIS — Z96649 Presence of unspecified artificial hip joint: Secondary | ICD-10-CM | POA: Insufficient documentation

## 2013-05-07 DIAGNOSIS — Y831 Surgical operation with implant of artificial internal device as the cause of abnormal reaction of the patient, or of later complication, without mention of misadventure at the time of the procedure: Secondary | ICD-10-CM | POA: Insufficient documentation

## 2013-05-07 HISTORY — DX: Other complications of anesthesia, initial encounter: T88.59XA

## 2013-05-07 HISTORY — DX: Adverse effect of unspecified anesthetic, initial encounter: T41.45XA

## 2013-05-07 LAB — URINALYSIS, ROUTINE W REFLEX MICROSCOPIC
Glucose, UA: NEGATIVE mg/dL
Hgb urine dipstick: NEGATIVE
Ketones, ur: NEGATIVE mg/dL
Leukocytes, UA: NEGATIVE
Nitrite: NEGATIVE
PROTEIN: 30 mg/dL — AB
Specific Gravity, Urine: 1.019 (ref 1.005–1.030)
Urobilinogen, UA: 0.2 mg/dL (ref 0.0–1.0)
pH: 5 (ref 5.0–8.0)

## 2013-05-07 LAB — BASIC METABOLIC PANEL
BUN: 17 mg/dL (ref 6–23)
CO2: 24 meq/L (ref 19–32)
Calcium: 9.8 mg/dL (ref 8.4–10.5)
Chloride: 106 mEq/L (ref 96–112)
Creatinine, Ser: 1.29 mg/dL (ref 0.50–1.35)
GFR calc Af Amer: 59 mL/min — ABNORMAL LOW (ref >90)
GFR calc non Af Amer: 51 mL/min — ABNORMAL LOW (ref >90)
Glucose, Bld: 78 mg/dL (ref 70–99)
Potassium: 4.2 mEq/L (ref 3.7–5.3)
Sodium: 141 mEq/L (ref 137–147)

## 2013-05-07 LAB — CBC
HCT: 34.5 % — ABNORMAL LOW (ref 39.0–52.0)
Hemoglobin: 11.1 g/dL — ABNORMAL LOW (ref 13.0–17.0)
MCH: 26.5 pg (ref 26.0–34.0)
MCHC: 32.2 g/dL (ref 30.0–36.0)
MCV: 82.3 fL (ref 78.0–100.0)
PLATELETS: 270 10*3/uL (ref 150–400)
RBC: 4.19 MIL/uL — ABNORMAL LOW (ref 4.22–5.81)
RDW: 15.7 % — ABNORMAL HIGH (ref 11.5–15.5)
WBC: 3.6 10*3/uL — ABNORMAL LOW (ref 4.0–10.5)

## 2013-05-07 LAB — SURGICAL PCR SCREEN
MRSA, PCR: NEGATIVE
Staphylococcus aureus: NEGATIVE

## 2013-05-07 LAB — URINE MICROSCOPIC-ADD ON

## 2013-05-07 LAB — PROTIME-INR
INR: 1.05 (ref 0.00–1.49)
Prothrombin Time: 13.5 seconds (ref 11.6–15.2)

## 2013-05-07 LAB — APTT: aPTT: 29 seconds (ref 24–37)

## 2013-05-07 NOTE — Patient Instructions (Addendum)
Derek Carr  05/07/2013                           YOUR PROCEDURE IS SCHEDULED ON: 05/14/13               PLEASE REPORT TO SHORT STAY CENTER AT : 9:30 AM               CALL THIS NUMBER IF ANY PROBLEMS THE DAY OF SURGERY :               [phone]                                REMEMBER:   Do not eat food or drink liquids AFTER MIDNIGHT   May have clear liquids UNTIL 6 HOURS BEFORE SURGERY               Take these medicines the morning of surgery with A SIP OF WATER: BICALUTAMIDE / ZETIA / VALTREX   Do not wear jewelry, make-up   Do not wear lotions, powders, or perfumes.   Do not shave legs or underarms 12 hrs. before surgery (men may shave face)  Do not bring valuables to the hospital.  Contacts, dentures or bridgework may not be worn into surgery.  Leave suitcase in the car. After surgery it may be brought to your room.  For patients admitted to the hospital more than one night, checkout time is            11:00 AM                                                       The day of discharge.   Patients discharged the day of surgery will not be allowed to drive home.            If going home same day of surgery, must have someone stay with you              FIRST 24 hrs at home and arrange for some one to drive you              home from hospital.                                                            [facility] - Preparing for Surgery Before surgery, you can play an important role.  Because skin is not sterile, your skin needs to be as free of germs as possible.  You can reduce the number of germs on your skin by washing with CHG (chlorahexidine gluconate) soap before surgery.  CHG is an antiseptic cleaner which kills germs and bonds with the skin to continue killing germs even after washing. Please DO NOT use if you have an allergy to CHG or antibacterial soaps.  If your skin becomes reddened/irritated stop using the CHG and inform your nurse when you arrive  at Short Stay. Do not shave (including legs and underarms) for at least 48 hours prior to the first  CHG shower.  You may shave your face. Please follow these instructions carefully:  1.  Shower with CHG Soap the night before surgery and the  morning of Surgery.   2.  If you choose to wash your hair, wash your hair first as usual with your  normal  Shampoo.   3.  After you shampoo, rinse your hair and body thoroughly to remove the  shampoo.                                         4.  Use CHG as you would any other liquid soap.  You can apply chg directly  to the skin and wash . Gently wash with scrungie or clean wascloth    5.  Apply the CHG Soap to your body ONLY FROM THE NECK DOWN.   Do not use on open                           Wound or open sores. Avoid contact with eyes, ears mouth and genitals (private parts).                        Genitals (private parts) with your normal soap.              6.  Wash thoroughly, paying special attention to the area where your surgery  will be performed.   7.  Thoroughly rinse your body with warm water from the neck down.   8.  DO NOT shower/wash with your normal soap after using and rinsing off  the CHG Soap .                9.  Pat yourself dry with a clean towel.             10.  Wear clean pajamas.             11.  Place clean sheets on your bed the night of your first shower and do not  sleep with pets.  Day of Surgery : Do not apply any lotions/deodorants the morning of surgery.  Please wear clean clothes to the hospital/surgery center.  FAILURE TO FOLLOW THESE INSTRUCTIONS MAY RESULT IN THE CANCELLATION OF YOUR SURGERY    PATIENT SIGNATURE_________________________________    Incentive Spirometer  An incentive spirometer is a tool that can help keep your lungs clear and active. This tool measures how well you are filling your lungs with each breath. Taking long deep breaths may help reverse or decrease the chance of developing  breathing (pulmonary) problems (especially infection) following:  A long period of time when you are unable to move or be active. BEFORE THE PROCEDURE   If the spirometer includes an indicator to show your best effort, your nurse or respiratory therapist will set it to a desired goal.  If possible, sit up straight or lean slightly forward. Try not to slouch.  Hold the incentive spirometer in an upright position. INSTRUCTIONS FOR USE  1. Sit on the edge of your bed if possible, or sit up as far as you can in bed or on a chair. 2. Hold the incentive spirometer in an upright position. 3. Breathe out normally. 4. Place the mouthpiece in your mouth and seal your lips tightly around it. 5. Breathe in slowly and as deeply  as possible, raising the piston or the ball toward the top of the column. 6. Hold your breath for 3-5 seconds or for as long as possible. Allow the piston or ball to fall to the bottom of the column. 7. Remove the mouthpiece from your mouth and breathe out normally. 8. Rest for a few seconds and repeat Steps 1 through 7 at least 10 times every 1-2 hours when you are awake. Take your time and take a few normal breaths between deep breaths. 9. The spirometer may include an indicator to show your best effort. Use the indicator as a goal to work toward during each repetition. 10. After each set of 10 deep breaths, practice coughing to be sure your lungs are clear. If you have an incision (the cut made at the time of surgery), support your incision when coughing by placing a pillow or rolled up towels firmly against it. Once you are able to get out of bed, walk around indoors and cough well. You may stop using the incentive spirometer when instructed by your caregiver.  RISKS AND COMPLICATIONS  Take your time so you do not get dizzy or light-headed.  If you are in pain, you may need to take or ask for pain medication before doing incentive spirometry. It is harder to take a deep  breath if you are having pain. AFTER USE  Rest and breathe slowly and easily.  It can be helpful to keep track of a log of your progress. Your caregiver can provide you with a simple table to help with this. If you are using the spirometer at home, follow these instructions: [place] IF:   You are having difficultly using the spirometer.  You have trouble using the spirometer as often as instructed.  Your pain medication is not giving enough relief while using the spirometer.  You develop fever of 100.5 F (38.1 C) or higher. SEEK IMMEDIATE MEDICAL CARE IF:   You cough up bloody sputum that had not been present before.  You develop fever of 102 F (38.9 C) or greater.  You develop worsening pain at or near the incision site. MAKE SURE YOU:   Understand these instructions.  Will watch your condition.  Will get help right away if you are not doing well or get worse. Document Released: 05/24/2006 Document Revised: 04/05/2011 Document Reviewed: 07/25/2006 [facility] Patient Information 2014 [place], [place].

## 2013-05-08 NOTE — Progress Notes (Signed)
Abnormal UA faxed to Dr.Olin 

## 2013-05-10 NOTE — Progress Notes (Signed)
Patients daughter robin called to ask when will lab results load to my chart? I responded i do not know ehn results will load to my chart, daughter stated dr Alvan Dame called in cipro and wanted to know micro, ua result, micro ua results given to daughter with instructions to follow up with any questions to dr Kennedy Bucker.

## 2013-05-13 ENCOUNTER — Other Ambulatory Visit: Payer: Self-pay | Admitting: Orthopedic Surgery

## 2013-05-13 NOTE — H&P (Signed)
TOTAL HIP REVISION ADMISSION H&P  Patient is admitted for right ORIF versus revision right total hip arthroplasty.  Subjective:  Chief Complaint: right hip pain  Derek Carr is a pleasant 80 year old male who is present with his wife and his daughters to discuss his right periprosthetic femur fracture. This is classified at this point as a Type B fracture with an avulsed greater trochanter but it also extends down into the area of his prosthesis. Based on the radiographs today (as will be noted in the assessment and plan) this appears to be a stable B as the component does not have any evidence of subsidence or early failure. To review, Derek Carr had his total hip arthroplasty performed about several weeks ago. In the hospital he had a fall when he slipped out of a chair and landed on his buttock area. There was some increased pain but he was able to perform physical therapy and thus radiographic evaluation was not carried out per my decision. None the less, he was seen back in the office and due to limitations that he and his family discussed, I ordered an early X-ray at two weeks out which confirmed this right periprosthetic fracture. We had a lengthy discussion then as well as in followup today to review these findings with the family and discuss options.  This condition presents safety issues increasing the risk of falls.  This patient has had proximal femur fracture.  There is no current active infection.  Patient Active Problem List   Diagnosis Date Noted  . Constipation 03/26/2013  . Osteoporosis 03/26/2013  . Expected blood loss anemia 03/23/2013  . S/P right THA, AA 03/20/2013  . Hyperlipidemia   . Erectile dysfunction   . History of asbestos exposure   . Nocturia   . History of shingles   . Dermatitis, atopic   . OA (osteoarthritis) of hip   . Arthritis   . Frequency of urination   . Lumbar scoliosis   . Recurrent prostate adenocarcinoma 06/07/2011  . 80 year old gentleman  with stage T3 adenocarcinoma prostate with Gleason score 4+4 and PSA of 10.3 05/11/2011   Past Medical History  Diagnosis Date  . Hyperlipidemia   . Hypertension   . Erectile dysfunction   . Prostate cancer 05/11/2011    bx=Adenocarcinoma,gleason3+4=7, 4+4=8,PSA=10.30volume=45.7cc  . History of asbestos exposure   . Nocturia   . History of shingles 2012-- BILATERAL EYES--  NO RESIDUAL  . Dermatitis, atopic ARMS AND LEGS  . OA (osteoarthritis) of hip RIGHT  . Arthritis   . Frequency of urination   . Smokers' cough   . Lumbar scoliosis   . History of radiation therapy 8/19-13-10/18/11    prostate, 85 GY  . Glaucoma   . DDD (degenerative disc disease)   . Diverticulosis   . Atherosclerosis   . Anemia   . Complication of anesthesia     CONFUSION - Pt's family very concerned about this    Past Surgical History  Procedure Laterality Date  . Esophageus cyst removal  YRS AGO  . Umbilical hernia repair  1998    epigastric  . Prostate biopsy  05/11/11    Adenocarcinoma ([facility] OFFICE)  . Cardiac catheterization  02-24-2009  DR NASHER    MINOR CORONARY ARTERY IRREGULARITIES/ NORMAL LVSF/ EF 65-70%  . Attempted left vats/ left thoracotomy/ resection of the enormous, probable bronchogenic cyst  01-04-2005  DR Arlyce Dice  . Repair right inguinal hernia w/ mesh  09-10-1999  . Lumbar spine surgery  1991  . Radioactive seed implant  11/15/2011    Procedure: RADIOACTIVE SEED IMPLANT;  Surgeon: Dutch Gray, MD;  Location: [facility];  Service: Urology;  Laterality: N/A;  Total number of seeds - 52  . Cystoscopy  11/15/2011    Procedure: CYSTOSCOPY;  Surgeon: Dutch Gray, MD;  Location: [facility];  Service: Urology;  Laterality: N/A;  no seeds seen in bladder  . Colonoscopy w/ polypectomy    . Total hip arthroplasty Right 03/20/2013    Procedure: RIGHT TOTAL HIP ARTHROPLASTY ANTERIOR APPROACH;  Surgeon: [name], MD;  Location: [facility];  Service: Orthopedics;   Laterality: Right;     (Not in a hospital admission) Allergies  Allergen Reactions  . Dilaudid [Hydromorphone Hcl]     drowsy  . Methocarbamol     drowsy  . Percodan [Oxycodone-Aspirin]     confusion  . Tramadol     drowsy  . Vicodin [Hydrocodone-Acetaminophen]     confusion    History  Substance Use Topics  . Smoking status: Former Smoker -- 0.10 packs/day for 30 years    Types: Cigarettes    Quit date: 03/15/2012  . Smokeless tobacco: Never Used     Comment: HAS CUT DOWN SMOKING FROM 1PPD TO 1PPWK  . Alcohol Use: 0.6 oz/week    1 Cans of beer per week     Comment: rare    Family History  Problem Relation Age of Onset  . Hypertension Mother   . Prostate cancer Brother     surgery/cured  . Lung cancer Brother     new primary/same brother      ROS Constitutional: Negative.  HENT: Negative.  Eyes: Negative.  Respiratory: Negative.  Cardiovascular: Negative.  Gastrointestinal: Negative.  Genitourinary: Positive for urgency.  Musculoskeletal: Positive for hip pain.  Skin: Positive for rash.  Neurological: Negative.  Endo/Heme/Allergies: Negative.  Psychiatric/Behavioral: Negative.  Objective:  Physical Exam Constitutional: He is oriented to person, place, and time. He appears well-developed and well-nourished.  HENT:  Head: Normocephalic and atraumatic.  Mouth/Throat: Oropharynx is clear and moist.  Eyes: Pupils are equal, round, and reactive to light. Wears glasses Neck: Neck supple. No JVD present. No tracheal deviation present. No thyromegaly present.  Cardiovascular: Normal rate, regular rhythm, normal heart sounds and intact distal pulses.  Respiratory: Effort normal and breath sounds normal. No stridor. No respiratory distress. He has no wheezes.  GI: Soft. There is no tenderness. There is no guarding.  Musculoskeletal:  Right hip: He exhibits decreased range of motion, decreased strength, tenderness and bony tenderness. He exhibits no swelling, no  deformity and no laceration.  Lymphadenopathy:  He has no cervical adenopathy.  Neurological: He is alert and oriented to person, place, and time.  Skin: Skin is warm and dry.  Psychiatric: He has a normal mood and affect.   Vital signs in last 24 hours: Pulse 64 Resp 16 BP 110/64   Labs:   Estimated body mass index is 22.62 kg/(m^2) as calculated from the following:   Height as of 05/07/13: 6\' 3"  (1.905 m).   Weight as of 05/07/13: 82.101 kg (181 lb).  Imaging Review: Status post right total hip arthroplasty with a Type A versus Type B stable periprosthetic femur fracture.   Assessment/Plan:  Status post right total hip arthroplasty with a Type A versus Type B stable periprosthetic femur fracture.   The patient history, physical examination, clinical judgement of the provider and imaging studies are consistent with stable  periprosthetic femur fracture  of the right hip(s), previous total hip arthroplasty. Revision total hip arthroplasty is deemed medically necessary. The treatment options including medical management, injection therapy, arthroscopy and arthroplasty were discussed at length. The risks and benefits of total hip arthroplasty were presented and reviewed. The risks due to aseptic loosening, infection, stiffness, dislocation/subluxation,  thromboembolic complications and other imponderables were discussed.  The patient acknowledged the explanation, agreed to proceed with the plan and consent was signed. Patient is being admitted for inpatient treatment for surgery, pain control, PT, OT, prophylactic antibiotics, VTE prophylaxis, progressive ambulation and ADL's and discharge planning. The patient is planning to be discharged to skilled nursing facility.  Arlee Muslim, PA-C

## 2013-05-14 ENCOUNTER — Inpatient Hospital Stay (HOSPITAL_COMMUNITY): Payer: Medicare Other | Admitting: Anesthesiology

## 2013-05-14 ENCOUNTER — Inpatient Hospital Stay (HOSPITAL_COMMUNITY): Payer: Medicare Other

## 2013-05-14 ENCOUNTER — Encounter (HOSPITAL_COMMUNITY)
Admission: RE | Disposition: A | Discharge status: Skilled Nursing Facility | Payer: Self-pay | Source: Ambulatory Visit | Attending: Orthopedic Surgery

## 2013-05-14 ENCOUNTER — Inpatient Hospital Stay (HOSPITAL_COMMUNITY)
Admission: RE | Admit: 2013-05-14 | Discharge: 2013-05-17 | DRG: 467 | Disposition: A | Discharge status: Skilled Nursing Facility | Payer: Medicare Other | Source: Ambulatory Visit | Attending: Orthopedic Surgery | Admitting: Orthopedic Surgery

## 2013-05-14 ENCOUNTER — Encounter (HOSPITAL_COMMUNITY): Payer: Self-pay | Admitting: *Deleted

## 2013-05-14 ENCOUNTER — Encounter (HOSPITAL_COMMUNITY): Payer: Medicare Other | Admitting: Anesthesiology

## 2013-05-14 DIAGNOSIS — D649 Anemia, unspecified: Secondary | ICD-10-CM | POA: Diagnosis present

## 2013-05-14 DIAGNOSIS — D62 Acute posthemorrhagic anemia: Secondary | ICD-10-CM | POA: Diagnosis not present

## 2013-05-14 DIAGNOSIS — I10 Essential (primary) hypertension: Secondary | ICD-10-CM | POA: Diagnosis present

## 2013-05-14 DIAGNOSIS — Z96649 Presence of unspecified artificial hip joint: Secondary | ICD-10-CM

## 2013-05-14 DIAGNOSIS — K5730 Diverticulosis of large intestine without perforation or abscess without bleeding: Secondary | ICD-10-CM | POA: Diagnosis present

## 2013-05-14 DIAGNOSIS — Y831 Surgical operation with implant of artificial internal device as the cause of abnormal reaction of the patient, or of later complication, without mention of misadventure at the time of the procedure: Secondary | ICD-10-CM | POA: Diagnosis present

## 2013-05-14 DIAGNOSIS — Z888 Allergy status to other drugs, medicaments and biological substances status: Secondary | ICD-10-CM

## 2013-05-14 DIAGNOSIS — T84049A Periprosthetic fracture around unspecified internal prosthetic joint, initial encounter: Principal | ICD-10-CM | POA: Diagnosis present

## 2013-05-14 DIAGNOSIS — I7090 Unspecified atherosclerosis: Secondary | ICD-10-CM | POA: Diagnosis present

## 2013-05-14 DIAGNOSIS — Z9660 Presence of unspecified orthopedic joint implant: Principal | ICD-10-CM

## 2013-05-14 DIAGNOSIS — E785 Hyperlipidemia, unspecified: Secondary | ICD-10-CM | POA: Diagnosis present

## 2013-05-14 DIAGNOSIS — Z87891 Personal history of nicotine dependence: Secondary | ICD-10-CM

## 2013-05-14 DIAGNOSIS — H409 Unspecified glaucoma: Secondary | ICD-10-CM | POA: Diagnosis present

## 2013-05-14 DIAGNOSIS — C61 Malignant neoplasm of prostate: Secondary | ICD-10-CM | POA: Diagnosis present

## 2013-05-14 DIAGNOSIS — Z77090 Contact with and (suspected) exposure to asbestos: Secondary | ICD-10-CM

## 2013-05-14 DIAGNOSIS — M4120 Other idiopathic scoliosis, site unspecified: Secondary | ICD-10-CM | POA: Diagnosis present

## 2013-05-14 DIAGNOSIS — IMO0002 Reserved for concepts with insufficient information to code with codable children: Secondary | ICD-10-CM | POA: Diagnosis present

## 2013-05-14 HISTORY — PX: TOTAL HIP REVISION: SHX763

## 2013-05-14 HISTORY — DX: Anemia, unspecified: D64.9

## 2013-05-14 LAB — URINALYSIS, ROUTINE W REFLEX MICROSCOPIC
BILIRUBIN URINE: NEGATIVE
Glucose, UA: NEGATIVE mg/dL
Hgb urine dipstick: NEGATIVE
Ketones, ur: NEGATIVE mg/dL
Leukocytes, UA: NEGATIVE
NITRITE: NEGATIVE
PH: 6 (ref 5.0–8.0)
Protein, ur: NEGATIVE mg/dL
SPECIFIC GRAVITY, URINE: 1.009 (ref 1.005–1.030)
Urobilinogen, UA: 0.2 mg/dL (ref 0.0–1.0)

## 2013-05-14 SURGERY — TOTAL HIP REVISION
Anesthesia: General | Site: Hip | Laterality: Right

## 2013-05-14 MED ORDER — CEFAZOLIN SODIUM-DEXTROSE 2-3 GM-% IV SOLR
INTRAVENOUS | Status: AC
  Filled 2013-05-14: qty 50

## 2013-05-14 MED ORDER — TRAMADOL HCL 50 MG PO TABS
50.0000 mg | ORAL_TABLET | Freq: Four times a day (QID) | ORAL | Status: DC | PRN
  Administered 2013-05-15: 50 mg via ORAL
  Administered 2013-05-15 – 2013-05-16 (×2): 100 mg via ORAL
  Administered 2013-05-17: 50 mg via ORAL
  Filled 2013-05-14: qty 1
  Filled 2013-05-14 (×2): qty 2
  Filled 2013-05-14: qty 1

## 2013-05-14 MED ORDER — ONDANSETRON HCL 4 MG/2ML IJ SOLN
INTRAMUSCULAR | Status: AC
  Filled 2013-05-14: qty 2

## 2013-05-14 MED ORDER — ONDANSETRON HCL 4 MG/2ML IJ SOLN
INTRAMUSCULAR | Status: DC | PRN
  Administered 2013-05-14 (×2): 2 mg via INTRAVENOUS

## 2013-05-14 MED ORDER — PHENYLEPHRINE HCL 10 MG/ML IJ SOLN
INTRAMUSCULAR | Status: DC | PRN
  Administered 2013-05-14 (×4): 40 ug via INTRAVENOUS

## 2013-05-14 MED ORDER — 0.9 % SODIUM CHLORIDE (POUR BTL) OPTIME
TOPICAL | Status: DC | PRN
  Administered 2013-05-14: 1000 mL

## 2013-05-14 MED ORDER — HYDRALAZINE HCL 20 MG/ML IJ SOLN
INTRAMUSCULAR | Status: DC | PRN
  Administered 2013-05-14: 4 mg via INTRAVENOUS

## 2013-05-14 MED ORDER — NEOSTIGMINE METHYLSULFATE 1 MG/ML IJ SOLN
INTRAMUSCULAR | Status: AC
  Filled 2013-05-14: qty 10

## 2013-05-14 MED ORDER — SODIUM CHLORIDE 0.9 % IV SOLN
100.0000 mL/h | INTRAVENOUS | Status: DC
  Administered 2013-05-14: 100 mL/h via INTRAVENOUS
  Filled 2013-05-14 (×10): qty 1000

## 2013-05-14 MED ORDER — EZETIMIBE 10 MG PO TABS
10.0000 mg | ORAL_TABLET | Freq: Every morning | ORAL | Status: DC
  Administered 2013-05-15 – 2013-05-17 (×3): 10 mg via ORAL
  Filled 2013-05-14 (×3): qty 1

## 2013-05-14 MED ORDER — FENTANYL CITRATE 0.05 MG/ML IJ SOLN
INTRAMUSCULAR | Status: AC
  Filled 2013-05-14: qty 5

## 2013-05-14 MED ORDER — DIPHENHYDRAMINE HCL 25 MG PO CAPS: 25.0000 mg | ORAL_CAPSULE | Freq: Four times a day (QID) | ORAL | Status: DC | PRN

## 2013-05-14 MED ORDER — POLYETHYLENE GLYCOL 3350 17 G PO PACK
17.0000 g | PACK | Freq: Two times a day (BID) | ORAL | Status: DC
  Administered 2013-05-15 – 2013-05-17 (×5): 17 g via ORAL

## 2013-05-14 MED ORDER — SODIUM CHLORIDE 0.9 % IV SOLN
INTRAVENOUS | Status: DC | PRN
  Administered 2013-05-14: 16:00:00 via INTRAVENOUS

## 2013-05-14 MED ORDER — CEFAZOLIN SODIUM-DEXTROSE 2-3 GM-% IV SOLR
2.0000 g | Freq: Four times a day (QID) | INTRAVENOUS | Status: AC
  Administered 2013-05-14 – 2013-05-15 (×2): 2 g via INTRAVENOUS
  Filled 2013-05-14 (×2): qty 50

## 2013-05-14 MED ORDER — ALUM & MAG HYDROXIDE-SIMETH 200-200-20 MG/5ML PO SUSP: 30.0000 mL | ORAL | Status: DC | PRN

## 2013-05-14 MED ORDER — SODIUM CHLORIDE 0.9 % IJ SOLN
INTRAMUSCULAR | Status: AC
  Filled 2013-05-14: qty 10

## 2013-05-14 MED ORDER — LACTATED RINGERS IV SOLN
INTRAVENOUS | Status: DC
  Administered 2013-05-14: 15:00:00 via INTRAVENOUS
  Administered 2013-05-14: 1000 mL via INTRAVENOUS

## 2013-05-14 MED ORDER — HYDROMORPHONE HCL PF 1 MG/ML IJ SOLN: 0.5000 mg | INTRAMUSCULAR | Status: DC | PRN

## 2013-05-14 MED ORDER — FERROUS SULFATE 325 (65 FE) MG PO TABS
325.0000 mg | ORAL_TABLET | Freq: Three times a day (TID) | ORAL | Status: DC
Start: 2013-05-14 — End: 2013-05-17
  Administered 2013-05-15 – 2013-05-17 (×5): 325 mg via ORAL
  Filled 2013-05-14 (×11): qty 1

## 2013-05-14 MED ORDER — ONDANSETRON HCL 4 MG/2ML IJ SOLN
4.0000 mg | Freq: Four times a day (QID) | INTRAMUSCULAR | Status: DC | PRN
  Administered 2013-05-14: 4 mg via INTRAVENOUS
  Filled 2013-05-14: qty 2

## 2013-05-14 MED ORDER — ASPIRIN EC 325 MG PO TBEC
325.0000 mg | DELAYED_RELEASE_TABLET | Freq: Two times a day (BID) | ORAL | Status: DC
  Administered 2013-05-15 – 2013-05-17 (×5): 325 mg via ORAL
  Filled 2013-05-14 (×7): qty 1

## 2013-05-14 MED ORDER — FENTANYL CITRATE 0.05 MG/ML IJ SOLN
INTRAMUSCULAR | Status: DC | PRN
  Administered 2013-05-14 (×5): 50 ug via INTRAVENOUS
  Administered 2013-05-14: 100 ug via INTRAVENOUS
  Administered 2013-05-14: 50 ug via INTRAVENOUS

## 2013-05-14 MED ORDER — BRIMONIDINE TARTRATE 0.2 % OP SOLN
1.0000 [drp] | Freq: Two times a day (BID) | OPHTHALMIC | Status: DC
  Administered 2013-05-14 – 2013-05-17 (×6): 1 [drp] via OPHTHALMIC
  Filled 2013-05-14: qty 5

## 2013-05-14 MED ORDER — TIMOLOL MALEATE 0.5 % OP SOLN
1.0000 [drp] | Freq: Two times a day (BID) | OPHTHALMIC | Status: DC
  Administered 2013-05-14 – 2013-05-17 (×6): 1 [drp] via OPHTHALMIC
  Filled 2013-05-14: qty 5

## 2013-05-14 MED ORDER — CEFAZOLIN SODIUM-DEXTROSE 2-3 GM-% IV SOLR
2.0000 g | INTRAVENOUS | Status: AC
  Administered 2013-05-14: 2 g via INTRAVENOUS

## 2013-05-14 MED ORDER — GLYCOPYRROLATE 0.2 MG/ML IJ SOLN
INTRAMUSCULAR | Status: AC
  Filled 2013-05-14: qty 3

## 2013-05-14 MED ORDER — ACETAMINOPHEN 10 MG/ML IV SOLN
1000.0000 mg | Freq: Once | INTRAVENOUS | Status: DC
  Filled 2013-05-14: qty 100

## 2013-05-14 MED ORDER — CHLORHEXIDINE GLUCONATE 4 % EX LIQD: 60.0000 mL | Freq: Once | CUTANEOUS | Status: DC

## 2013-05-14 MED ORDER — TAMSULOSIN HCL 0.4 MG PO CAPS
0.4000 mg | ORAL_CAPSULE | Freq: Two times a day (BID) | ORAL | Status: DC
  Administered 2013-05-14 – 2013-05-17 (×6): 0.4 mg via ORAL
  Filled 2013-05-14 (×8): qty 1

## 2013-05-14 MED ORDER — PROPOFOL 10 MG/ML IV BOLUS
INTRAVENOUS | Status: DC | PRN
  Administered 2013-05-14: 170 mg via INTRAVENOUS

## 2013-05-14 MED ORDER — PHENOL 1.4 % MT LIQD: 1.0000 | OROMUCOSAL | Status: DC | PRN

## 2013-05-14 MED ORDER — NEOSTIGMINE METHYLSULFATE 1 MG/ML IJ SOLN
INTRAMUSCULAR | Status: DC | PRN
  Administered 2013-05-14: 2 mg via INTRAVENOUS

## 2013-05-14 MED ORDER — PROPOFOL 10 MG/ML IV BOLUS
INTRAVENOUS | Status: AC
  Filled 2013-05-14: qty 20

## 2013-05-14 MED ORDER — MORPHINE SULFATE 10 MG/ML IJ SOLN: 1.0000 mg | INTRAMUSCULAR | Status: DC | PRN

## 2013-05-14 MED ORDER — METHOCARBAMOL 100 MG/ML IJ SOLN
500.0000 mg | Freq: Four times a day (QID) | INTRAVENOUS | Status: DC | PRN
  Filled 2013-05-14: qty 5

## 2013-05-14 MED ORDER — CISATRACURIUM BESYLATE 20 MG/10ML IV SOLN
INTRAVENOUS | Status: AC
  Filled 2013-05-14: qty 10

## 2013-05-14 MED ORDER — MAGNESIUM CITRATE PO SOLN: 1.0000 | Freq: Once | ORAL | Status: AC | PRN

## 2013-05-14 MED ORDER — VALACYCLOVIR HCL 500 MG PO TABS
500.0000 mg | ORAL_TABLET | Freq: Two times a day (BID) | ORAL | Status: DC
  Administered 2013-05-14 – 2013-05-17 (×6): 500 mg via ORAL
  Filled 2013-05-14 (×7): qty 1

## 2013-05-14 MED ORDER — METOCLOPRAMIDE HCL 10 MG PO TABS: 5.0000 mg | ORAL_TABLET | Freq: Three times a day (TID) | ORAL | Status: DC | PRN

## 2013-05-14 MED ORDER — METHOCARBAMOL 500 MG PO TABS
500.0000 mg | ORAL_TABLET | Freq: Four times a day (QID) | ORAL | Status: DC | PRN
  Administered 2013-05-16: 500 mg via ORAL
  Filled 2013-05-14: qty 1

## 2013-05-14 MED ORDER — CISATRACURIUM BESYLATE (PF) 10 MG/5ML IV SOLN
INTRAVENOUS | Status: DC | PRN
  Administered 2013-05-14: 10 mg via INTRAVENOUS
  Administered 2013-05-14 (×3): 2 mg via INTRAVENOUS

## 2013-05-14 MED ORDER — MENTHOL 3 MG MT LOZG: 1.0000 | LOZENGE | OROMUCOSAL | Status: DC | PRN

## 2013-05-14 MED ORDER — DOCUSATE SODIUM 100 MG PO CAPS
100.0000 mg | ORAL_CAPSULE | Freq: Two times a day (BID) | ORAL | Status: DC
  Administered 2013-05-14 – 2013-05-17 (×6): 100 mg via ORAL

## 2013-05-14 MED ORDER — BICALUTAMIDE 50 MG PO TABS
50.0000 mg | ORAL_TABLET | Freq: Every morning | ORAL | Status: DC
  Administered 2013-05-15 – 2013-05-17 (×3): 50 mg via ORAL
  Filled 2013-05-14 (×3): qty 1

## 2013-05-14 MED ORDER — GLYCOPYRROLATE 0.2 MG/ML IJ SOLN
INTRAMUSCULAR | Status: DC | PRN
  Administered 2013-05-14: .4 mg via INTRAVENOUS
  Administered 2013-05-14: .2 mg via INTRAVENOUS

## 2013-05-14 MED ORDER — DEXAMETHASONE SODIUM PHOSPHATE 10 MG/ML IJ SOLN
10.0000 mg | Freq: Once | INTRAMUSCULAR | Status: AC
  Administered 2013-05-15: 10 mg via INTRAVENOUS
  Filled 2013-05-14: qty 1

## 2013-05-14 MED ORDER — CALCIUM CARBONATE ANTACID 500 MG PO CHEW
1.0000 | CHEWABLE_TABLET | Freq: Every day | ORAL | Status: DC
  Administered 2013-05-14 – 2013-05-16 (×3): 200 mg via ORAL
  Filled 2013-05-14 (×4): qty 1

## 2013-05-14 MED ORDER — HYDRALAZINE HCL 20 MG/ML IJ SOLN
INTRAMUSCULAR | Status: AC
  Filled 2013-05-14: qty 1

## 2013-05-14 MED ORDER — PROMETHAZINE HCL 25 MG/ML IJ SOLN: 6.2500 mg | INTRAMUSCULAR | Status: DC | PRN

## 2013-05-14 MED ORDER — LACTATED RINGERS IV SOLN
INTRAVENOUS | Status: DC
  Administered 2013-05-14 (×2): via INTRAVENOUS
  Administered 2013-05-14: 1000 mL via INTRAVENOUS

## 2013-05-14 MED ORDER — CELECOXIB 200 MG PO CAPS
200.0000 mg | ORAL_CAPSULE | Freq: Two times a day (BID) | ORAL | Status: DC
  Administered 2013-05-14 – 2013-05-17 (×6): 200 mg via ORAL
  Filled 2013-05-14 (×7): qty 1

## 2013-05-14 MED ORDER — BISACODYL 10 MG RE SUPP: 10.0000 mg | Freq: Every day | RECTAL | Status: DC | PRN

## 2013-05-14 MED ORDER — BRIMONIDINE TARTRATE-TIMOLOL 0.2-0.5 % OP SOLN: 1.0000 [drp] | Freq: Two times a day (BID) | OPHTHALMIC | Status: DC

## 2013-05-14 MED ORDER — METOCLOPRAMIDE HCL 5 MG/ML IJ SOLN: 5.0000 mg | Freq: Three times a day (TID) | INTRAMUSCULAR | Status: DC | PRN

## 2013-05-14 MED ORDER — ONDANSETRON HCL 4 MG PO TABS: 4.0000 mg | ORAL_TABLET | Freq: Four times a day (QID) | ORAL | Status: DC | PRN

## 2013-05-14 MED ORDER — SIMVASTATIN 40 MG PO TABS
40.0000 mg | ORAL_TABLET | Freq: Every day | ORAL | Status: DC
  Administered 2013-05-14 – 2013-05-16 (×3): 40 mg via ORAL
  Filled 2013-05-14 (×4): qty 1

## 2013-05-14 SURGICAL SUPPLY — 59 items
ARTICULEZE HEAD (Hips) ×2 IMPLANT
BAG ZIPLOCK 12X15 (MISCELLANEOUS) IMPLANT
BLADE SAW SGTL 18X1.27X75 (BLADE) ×2 IMPLANT
BRUSH FEMORAL CANAL (MISCELLANEOUS) IMPLANT
CABLE (Orthopedic Implant) ×8 IMPLANT
DERMABOND ADVANCED (GAUZE/BANDAGES/DRESSINGS) ×1
DERMABOND ADVANCED .7 DNX12 (GAUZE/BANDAGES/DRESSINGS) ×1 IMPLANT
DRAPE INCISE IOBAN 85X60 (DRAPES) ×2 IMPLANT
DRAPE LG THREE QUARTER DISP (DRAPES) ×4 IMPLANT
DRAPE ORTHO SPLIT 77X108 STRL (DRAPES) ×2
DRAPE POUCH INSTRU U-SHP 10X18 (DRAPES) ×2 IMPLANT
DRAPE SURG 17X11 SM STRL (DRAPES) ×2 IMPLANT
DRAPE SURG ORHT 6 SPLT 77X108 (DRAPES) ×2 IMPLANT
DRAPE U-SHAPE 47X51 STRL (DRAPES) ×2 IMPLANT
DRSG AQUACEL AG ADV 3.5X10 (GAUZE/BANDAGES/DRESSINGS) IMPLANT
DRSG AQUACEL AG ADV 3.5X14 (GAUZE/BANDAGES/DRESSINGS) ×2 IMPLANT
DRSG EMULSION OIL 3X16 NADH (GAUZE/BANDAGES/DRESSINGS) IMPLANT
DRSG MEPILEX BORDER 4X4 (GAUZE/BANDAGES/DRESSINGS) IMPLANT
DRSG MEPILEX BORDER 4X8 (GAUZE/BANDAGES/DRESSINGS) IMPLANT
DRSG TEGADERM 4X4.75 (GAUZE/BANDAGES/DRESSINGS) IMPLANT
DURAPREP 26ML APPLICATOR (WOUND CARE) ×2 IMPLANT
ELECT BLADE TIP CTD 4 INCH (ELECTRODE) ×2 IMPLANT
ELECT REM PT RETURN 9FT ADLT (ELECTROSURGICAL) ×2
ELECTRODE REM PT RTRN 9FT ADLT (ELECTROSURGICAL) ×1 IMPLANT
EVACUATOR 1/8 PVC DRAIN (DRAIN) IMPLANT
FACESHIELD WRAPAROUND (MASK) ×8 IMPLANT
GAUZE SPONGE 2X2 8PLY STRL LF (GAUZE/BANDAGES/DRESSINGS) IMPLANT
GLOVE BIOGEL PI IND STRL 7.5 (GLOVE) ×1 IMPLANT
GLOVE BIOGEL PI IND STRL 8 (GLOVE) ×1 IMPLANT
GLOVE BIOGEL PI INDICATOR 7.5 (GLOVE) ×1
GLOVE BIOGEL PI INDICATOR 8 (GLOVE) ×1
GLOVE ECLIPSE 8.0 STRL XLNG CF (GLOVE) ×2 IMPLANT
GLOVE ORTHO TXT STRL SZ7.5 (GLOVE) ×10 IMPLANT
GOWN BRE IMP PREV XXLGXLNG (GOWN DISPOSABLE) ×2 IMPLANT
GOWN SPEC L3 XXLG W/TWL (GOWN DISPOSABLE) ×2 IMPLANT
GOWN STRL REUS W/TWL LRG LVL3 (GOWN DISPOSABLE) ×2 IMPLANT
GOWN STRL REUS W/TWL XL LVL3 (GOWN DISPOSABLE) ×2 IMPLANT
HANDPIECE INTERPULSE COAX TIP (DISPOSABLE)
HEAD ARTICULEZE (Hips) ×1 IMPLANT
IMMOBILIZER KNEE 16 UNIV (MISCELLANEOUS) ×2 IMPLANT
KIT BASIN OR (CUSTOM PROCEDURE TRAY) ×2 IMPLANT
MANIFOLD NEPTUNE II (INSTRUMENTS) ×2 IMPLANT
NS IRRIG 1000ML POUR BTL (IV SOLUTION) ×2 IMPLANT
PACK TOTAL JOINT (CUSTOM PROCEDURE TRAY) ×2 IMPLANT
POSITIONER SURGICAL ARM (MISCELLANEOUS) ×2 IMPLANT
PRESSURIZER FEMORAL UNIV (MISCELLANEOUS) IMPLANT
SET HNDPC FAN SPRY TIP SCT (DISPOSABLE) IMPLANT
SPONGE GAUZE 2X2 STER 10/PKG (GAUZE/BANDAGES/DRESSINGS)
SPONGE LAP 18X18 X RAY DECT (DISPOSABLE) ×4 IMPLANT
STAPLER VISISTAT 35W (STAPLE) IMPLANT
SUCTION FRAZIER TIP 10 FR DISP (SUCTIONS) ×2 IMPLANT
SUT VIC AB 1 CT1 36 (SUTURE) ×6 IMPLANT
SUT VIC AB 2-0 CT1 27 (SUTURE) ×2
SUT VIC AB 2-0 CT1 TAPERPNT 27 (SUTURE) ×2 IMPLANT
SUT VLOC 180 0 24IN GS25 (SUTURE) ×2 IMPLANT
TOWEL OR 17X26 10 PK STRL BLUE (TOWEL DISPOSABLE) ×4 IMPLANT
TOWER CARTRIDGE SMART MIX (DISPOSABLE) IMPLANT
TRAY FOLEY METER SIL LF 16FR (CATHETERS) ×2 IMPLANT
WATER STERILE IRR 1500ML POUR (IV SOLUTION) ×2 IMPLANT

## 2013-05-14 NOTE — Brief Op Note (Signed)
05/14/2013  4:15 PM  PATIENT:  Derek Carr  80 y.o. male  PRE-OPERATIVE DIAGNOSIS:  RIGHT HIP PERIPROSTHETIC PROXIMAL FEMUR  FRACTURE  POST-OPERATIVE DIAGNOSIS:  RIGHT HIP PERIPROSTHETIC PROXIMAL FEMUR  FRACTURE  PROCEDURE:  Procedure(s open reduction internal fixation of right periprosthetic femur fracture with REVISION RIGHT HIP, one component (femoral head)  SURGEON:  Surgeon(s) and Role:    * [name], MD - Primary  PHYSICIAN ASSISTANT: Danae Orleans, PA-C  ANESTHESIA:   general  EBL:  Total I/O In: 2900 [I.V.:2900] Out: 1200 [Urine:200; Blood:1000]  BLOOD ADMINISTERED:none  DRAINS: none   LOCAL MEDICATIONS USED:  NONE  SPECIMEN:  No Specimen  DISPOSITION OF SPECIMEN:  N/A  COUNTS:  YES  TOURNIQUET:  * No tourniquets in log *  DICTATION: .Other Dictation: Dictation Number [id number]  PLAN OF CARE: Admit to inpatient   PATIENT DISPOSITION:  PACU - hemodynamically stable.   Delay start of Pharmacological VTE agent (>24hrs) due to surgical blood loss or risk of bleeding: no

## 2013-05-14 NOTE — H&P (View-Only) (Signed)
TOTAL HIP REVISION ADMISSION H&P  Patient is admitted for right ORIF versus revision right total hip arthroplasty.  Subjective:  Chief Complaint: right hip pain  Derek Carr is a pleasant 81 year old male who is present with his wife and his daughters to discuss his right periprosthetic femur fracture. This is classified at this point as a Type B fracture with an avulsed greater trochanter but it also extends down into the area of his prosthesis. Based on the radiographs today (as will be noted in the assessment and plan) this appears to be a stable B as the component does not have any evidence of subsidence or early failure. To review, Derek Carr had his total hip arthroplasty performed about several weeks ago. In the hospital he had a fall when he slipped out of a chair and landed on his buttock area. There was some increased pain but he was able to perform physical therapy and thus radiographic evaluation was not carried out per my decision. None the less, he was seen back in the office and due to limitations that he and his family discussed, I ordered an early X-ray at two weeks out which confirmed this right periprosthetic fracture. We had a lengthy discussion then as well as in followup today to review these findings with the family and discuss options.  This condition presents safety issues increasing the risk of falls.  This patient has had proximal femur fracture.  There is no current active infection.  Patient Active Problem List   Diagnosis Date Noted  . Constipation 03/26/2013  . Osteoporosis 03/26/2013  . Expected blood loss anemia 03/23/2013  . S/P right THA, AA 03/20/2013  . Hyperlipidemia   . Erectile dysfunction   . History of asbestos exposure   . Nocturia   . History of shingles   . Dermatitis, atopic   . OA (osteoarthritis) of hip   . Arthritis   . Frequency of urination   . Lumbar scoliosis   . Recurrent prostate adenocarcinoma 06/07/2011  . 79-year-old gentleman  with stage T3 adenocarcinoma prostate with Gleason score 4+4 and PSA of 10.3 05/11/2011   Past Medical History  Diagnosis Date  . Hyperlipidemia   . Hypertension   . Erectile dysfunction   . Prostate cancer 05/11/2011    bx=Adenocarcinoma,gleason3+4=7, 4+4=8,PSA=10.30volume=45.7cc  . History of asbestos exposure   . Nocturia   . History of shingles 2012-- BILATERAL EYES--  NO RESIDUAL  . Dermatitis, atopic ARMS AND LEGS  . OA (osteoarthritis) of hip RIGHT  . Arthritis   . Frequency of urination   . Smokers' cough   . Lumbar scoliosis   . History of radiation therapy 8/19-13-10/18/11    prostate, 47 GY  . Glaucoma   . DDD (degenerative disc disease)   . Diverticulosis   . Atherosclerosis   . Anemia   . Complication of anesthesia     CONFUSION - Pt's family very concerned about this    Past Surgical History  Procedure Laterality Date  . Esophageus cyst removal  YRS AGO  . Umbilical hernia repair  1998    epigastric  . Prostate biopsy  05/11/11    Adenocarcinoma ([facility] OFFICE)  . Cardiac catheterization  02-24-2009  DR NASHER    MINOR CORONARY ARTERY IRREGULARITIES/ NORMAL LVSF/ EF 65-70%  . Attempted left vats/ left thoracotomy/ resection of the enormous, probable bronchogenic cyst  01-04-2005  DR BURNEY  . Repair right inguinal hernia w/ mesh  09-10-1999  . Lumbar spine surgery    1991  . Radioactive seed implant  11/15/2011    Procedure: RADIOACTIVE SEED IMPLANT;  Surgeon: Dutch Gray, MD;  Location: [facility];  Service: Urology;  Laterality: N/A;  Total number of seeds - 52  . Cystoscopy  11/15/2011    Procedure: CYSTOSCOPY;  Surgeon: Dutch Gray, MD;  Location: [facility];  Service: Urology;  Laterality: N/A;  no seeds seen in bladder  . Colonoscopy w/ polypectomy    . Total hip arthroplasty Right 03/20/2013    Procedure: RIGHT TOTAL HIP ARTHROPLASTY ANTERIOR APPROACH;  Surgeon: [name], MD;  Location: [facility];  Service: Orthopedics;   Laterality: Right;     (Not in a hospital admission) Allergies  Allergen Reactions  . Dilaudid [Hydromorphone Hcl]     drowsy  . Methocarbamol     drowsy  . Percodan [Oxycodone-Aspirin]     confusion  . Tramadol     drowsy  . Vicodin [Hydrocodone-Acetaminophen]     confusion    History  Substance Use Topics  . Smoking status: Former Smoker -- 0.10 packs/day for 30 years    Types: Cigarettes    Quit date: 03/15/2012  . Smokeless tobacco: Never Used     Comment: HAS CUT DOWN SMOKING FROM 1PPD TO 1PPWK  . Alcohol Use: 0.6 oz/week    1 Cans of beer per week     Comment: rare    Family History  Problem Relation Age of Onset  . Hypertension Mother   . Prostate cancer Brother     surgery/cured  . Lung cancer Brother     new primary/same brother      ROS Constitutional: Negative.  HENT: Negative.  Eyes: Negative.  Respiratory: Negative.  Cardiovascular: Negative.  Gastrointestinal: Negative.  Genitourinary: Positive for urgency.  Musculoskeletal: Positive for hip pain.  Skin: Positive for rash.  Neurological: Negative.  Endo/Heme/Allergies: Negative.  Psychiatric/Behavioral: Negative.  Objective:  Physical Exam Constitutional: He is oriented to person, place, and time. He appears well-developed and well-nourished.  HENT:  Head: Normocephalic and atraumatic.  Mouth/Throat: Oropharynx is clear and moist.  Eyes: Pupils are equal, round, and reactive to light. Wears glasses Neck: Neck supple. No JVD present. No tracheal deviation present. No thyromegaly present.  Cardiovascular: Normal rate, regular rhythm, normal heart sounds and intact distal pulses.  Respiratory: Effort normal and breath sounds normal. No stridor. No respiratory distress. He has no wheezes.  GI: Soft. There is no tenderness. There is no guarding.  Musculoskeletal:  Right hip: He exhibits decreased range of motion, decreased strength, tenderness and bony tenderness. He exhibits no swelling, no  deformity and no laceration.  Lymphadenopathy:  He has no cervical adenopathy.  Neurological: He is alert and oriented to person, place, and time.  Skin: Skin is warm and dry.  Psychiatric: He has a normal mood and affect.   Vital signs in last 24 hours: Pulse 64 Resp 16 BP 110/64   Labs:   Estimated body mass index is 22.62 kg/(m^2) as calculated from the following:   Height as of 05/07/13: 6\' 3"  (1.905 m).   Weight as of 05/07/13: 82.101 kg (181 lb).  Imaging Review: Status post right total hip arthroplasty with a Type A versus Type B stable periprosthetic femur fracture.   Assessment/Plan:  Status post right total hip arthroplasty with a Type A versus Type B stable periprosthetic femur fracture.   The patient history, physical examination, clinical judgement of the provider and imaging studies are consistent with stable  periprosthetic femur fracture  of the right hip(s), previous total hip arthroplasty. Revision total hip arthroplasty is deemed medically necessary. The treatment options including medical management, injection therapy, arthroscopy and arthroplasty were discussed at length. The risks and benefits of total hip arthroplasty were presented and reviewed. The risks due to aseptic loosening, infection, stiffness, dislocation/subluxation,  thromboembolic complications and other imponderables were discussed.  The patient acknowledged the explanation, agreed to proceed with the plan and consent was signed. Patient is being admitted for inpatient treatment for surgery, pain control, PT, OT, prophylactic antibiotics, VTE prophylaxis, progressive ambulation and ADL's and discharge planning. The patient is planning to be discharged to skilled nursing facility.  Drew Candida Vetter, PA-C  

## 2013-05-14 NOTE — Anesthesia Postprocedure Evaluation (Signed)
  Anesthesia Post-op Note  Patient: Derek Carr  Procedure(s) Performed: Procedure(s) (LRB): open reduction internal fixation REVISION RIGHT HIP  (Right)  Patient Location: PACU  Anesthesia Type: General  Level of Consciousness: awake and alert   Airway and Oxygen Therapy: Patient Spontanous Breathing  Post-op Pain: mild  Post-op Assessment: Post-op Vital signs reviewed, Patient's Cardiovascular Status Stable, Respiratory Function Stable, Patent Airway and No signs of Nausea or vomiting  Last Vitals:  Filed Vitals:   05/14/13 1715  BP: 157/87  Pulse: 91  Temp: 36.6 C  Resp: 9    Post-op Vital Signs: stable   Complications: No apparent anesthesia complications

## 2013-05-14 NOTE — Transfer of Care (Signed)
Immediate Anesthesia Transfer of Care Note  Patient: Derek Carr  Procedure(s) Performed: Procedure(s): open reduction internal fixation REVISION RIGHT HIP  (Right)  Patient Location: PACU  Anesthesia Type:General  Level of Consciousness: awake, oriented, confused and responds to stimulation  Airway & Oxygen Therapy: Patient Spontanous Breathing and Patient connected to face mask oxygen  Post-op Assessment: Report given to PACU RN, Post -op Vital signs reviewed and stable and Patient moving all extremities  Post vital signs: Reviewed and stable  Complications: No apparent anesthesia complications

## 2013-05-14 NOTE — Interval H&P Note (Signed)
History and Physical Interval Note:  05/14/2013 10:09 AM  Derek Carr  has presented today for surgery, with the diagnosis of RIGHT HIP PERIPROSTHETIC PROXIMAL FRACTURE  The various methods of treatment have been discussed with the patient and family. After consideration of risks, benefits and other options for treatment, the patient has consented to  Procedure(s): RIGHT OPEN REDUCTION INTERNAL FIXATION VERSES REVISION RIGHT HIP SURGERY (Right) as a surgical intervention .  The patient's history has been reviewed, patient examined, no change in status, stable for surgery.  I have reviewed the patient's chart and labs.  Questions were answered to the patient's satisfaction.     [name]

## 2013-05-14 NOTE — Anesthesia Preprocedure Evaluation (Addendum)
Anesthesia Evaluation  Patient identified by MRN, date of birth, ID band Patient awake    Reviewed: Allergy & Precautions, H&P , NPO status , Patient's Chart, lab work & pertinent test results  Airway Mallampati: II TM Distance: >3 FB Neck ROM: Full    Dental no notable dental hx.    Pulmonary neg pulmonary ROS, former smoker,  breath sounds clear to auscultation  Pulmonary exam normal       Cardiovascular hypertension, Pt. on medications Rhythm:Regular Rate:Normal     Neuro/Psych negative neurological ROS  negative psych ROS   GI/Hepatic negative GI ROS, Neg liver ROS,   Endo/Other  negative endocrine ROS  Renal/GU negative Renal ROS  negative genitourinary   Musculoskeletal negative musculoskeletal ROS (+)   Abdominal   Peds negative pediatric ROS (+)  Hematology  (+) anemia ,   Anesthesia Other Findings   Reproductive/Obstetrics negative OB ROS                          Anesthesia Physical Anesthesia Plan  ASA: II  Anesthesia Plan: General   Post-op Pain Management:    Induction: Intravenous  Airway Management Planned: Oral ETT  Additional Equipment:   Intra-op Plan:   Post-operative Plan: Extubation in OR  Informed Consent: I have reviewed the patients History and Physical, chart, labs and discussed the procedure including the risks, benefits and alternatives for the proposed anesthesia with the patient or authorized representative who has indicated his/her understanding and acceptance.   Dental advisory given  Plan Discussed with: CRNA and Surgeon  Anesthesia Plan Comments: (Some post op confusion after most recent surgery. Will use Desflurane as maintenance vapor, did not receive midazolam last surgery)       Anesthesia Quick Evaluation

## 2013-05-15 ENCOUNTER — Encounter (HOSPITAL_COMMUNITY): Payer: Self-pay | Admitting: Orthopedic Surgery

## 2013-05-15 LAB — BASIC METABOLIC PANEL
BUN: 13 mg/dL (ref 6–23)
CHLORIDE: 107 meq/L (ref 96–112)
CO2: 24 mEq/L (ref 19–32)
Calcium: 8.7 mg/dL (ref 8.4–10.5)
Creatinine, Ser: 0.98 mg/dL (ref 0.50–1.35)
GFR, EST AFRICAN AMERICAN: 88 mL/min — AB (ref >90)
GFR, EST NON AFRICAN AMERICAN: 76 mL/min — AB (ref >90)
Glucose, Bld: 126 mg/dL — ABNORMAL HIGH (ref 70–99)
Potassium: 4.3 mEq/L (ref 3.7–5.3)
Sodium: 141 mEq/L (ref 137–147)

## 2013-05-15 LAB — CBC
HCT: 24.5 % — ABNORMAL LOW (ref 39.0–52.0)
Hemoglobin: 8 g/dL — ABNORMAL LOW (ref 13.0–17.0)
MCH: 26.6 pg (ref 26.0–34.0)
MCHC: 32.7 g/dL (ref 30.0–36.0)
MCV: 81.4 fL (ref 78.0–100.0)
Platelets: 205 10*3/uL (ref 150–400)
RBC: 3.01 MIL/uL — ABNORMAL LOW (ref 4.22–5.81)
RDW: 15.4 % (ref 11.5–15.5)
WBC: 5.7 10*3/uL (ref 4.0–10.5)

## 2013-05-15 MED ORDER — ACETAMINOPHEN 500 MG PO TABS
1000.0000 mg | ORAL_TABLET | Freq: Three times a day (TID) | ORAL | Status: DC | PRN
  Administered 2013-05-15: 1000 mg via ORAL
  Filled 2013-05-15: qty 2

## 2013-05-15 NOTE — Progress Notes (Signed)
Physical Therapy Treatment Patient Details Name: Derek Carr MRN: [id number] DOB: [birth year] Today's Date: 05/15/2013    History of Present Illness 80 yo male s/p R THA revision, ORIF R femur 4/20 secondary to fall with periprosthetic fx. Hx of R direct anterior THA 03/20/13.     PT Comments    Improved adherence to [facility] during stand pivot this session. Continues to require Max cueing while mobilizing. Will need SNF.   Follow Up Recommendations  SNF;Supervision/Assistance - 24 hour     Equipment Recommendations       Recommendations for Other Services OT consult     Precautions / Restrictions Precautions Precautions: Fall Precaution Comments: NO ACTIVE HIP ABDUCTION Required Braces or Orthoses: Knee Immobilizer - Right Restrictions Weight Bearing Restrictions: Yes RLE Weight Bearing: Partial weight bearing RLE Partial Weight Bearing Percentage or Pounds: 25-50%    Mobility  Bed Mobility Overal bed mobility: Needs Assistance Bed Mobility: Sit to Supine     Sit to supine: Mod assist;+2 for physical assistance;+2 for safety/equipment   General bed mobility comments: assist for bil LEs and trunk. VCs safety.   Transfers Overall transfer level: Needs assistance Equipment used: Rolling walker (2 wheeled) Transfers: Sit to/from [facility] Sit to Stand: Max assist;+2 physical assistance;+2 safety/equipment Stand pivot transfers: Mod assist;+2 physical assistance;+2 safety/equipment       General transfer comment: MAX cues for safety, technique, sequence, adherence to PWB status. Pt did better with maintaining PWB status during pivot.   Ambulation/Gait   Ambulation Distance (Feet): 2 Feet Assistive device: Rolling walker (2 wheeled) Gait Pattern/deviations: Step-to pattern;Decreased stride length;Decreased step length - right;Decreased step length - left;Antalgic     General Gait Details: Limited distance due to difficulty maintaining PWB status.     Stairs            Wheelchair Mobility    Modified Rankin (Stroke Patients Only)       Balance                                    Cognition Arousal/Alertness: Awake/alert Behavior During Therapy: WFL for tasks assessed/performed Overall Cognitive Status: Impaired/Different from baseline Area of Impairment: Attention;Memory;Problem solving;Safety/judgement   Current Attention Level: Sustained Memory: Decreased recall of precautions;Decreased short-term memory   Safety/Judgement: Decreased awareness of safety;Decreased awareness of deficits   Problem Solving: Slow processing;Difficulty sequencing;Requires verbal cues;Requires tactile cues General Comments: Pt lives with his wife PTA.  Seems to have cognitive problems during hospitalizations but was intact prior to initial hip surgery per daughter.    Exercises      General Comments        Pertinent Vitals/Pain R hip with activity-unrated    Home Living Family/patient expects to be discharged to:: Skilled nursing facility Living Arrangements: Spouse/significant other                  Prior Function        Comments: pt was I prior to initial hip surgery and subsequent fall.  Since then has needed some assist with adls.  Pt has been sponge bathing and has been TDWB since the new fx was found.     PT Goals (current goals can now be found in the care plan section) Acute Rehab PT Goals Patient Stated Goal: to be able to walk PT Goal Formulation: With patient/family Time For Goal Achievement: 05/22/13 Potential to Achieve Goals: Good Progress towards  PT goals: Progressing toward goals    Frequency  Min 5X/week    PT Plan Current plan remains appropriate    Co-evaluation             End of Session Equipment Utilized During Treatment: Gait belt Activity Tolerance: Patient tolerated treatment well Patient left: in bed;with call bell/phone within reach;with family/visitor  present;with bed alarm set     Time: 1335-1346 PT Time Calculation (min): 11 min  Charges:  $Gait Training: 8-22 mins $Therapeutic Activity: 8-22 mins                    G Codes:      Weston Anna, MPT Pager: [phone]

## 2013-05-15 NOTE — Progress Notes (Signed)
   Subjective: 1 Day Post-Op Procedure(s) (LRB): open reduction internal fixation REVISION RIGHT HIP  (Right)   Patient reports pain as mild, pain controlled. No events throughout the night.  Discussed H&H, will continue to watch.  Objective:   VITALS:   Filed Vitals:   05/15/13  BP: 121/71  Pulse: 88  Temp: 98.2 F (36.8 C)   Resp: 18    Neurovascular intact Dorsiflexion/Plantar flexion intact Incision: dressing C/D/I No cellulitis present Compartment soft  LABS  Recent Labs  05/15/13 0434  HGB 8.0*  HCT 24.5*  WBC 5.7  PLT 205     Recent Labs  05/15/13 0434  NA 141  K 4.3  BUN 13  CREATININE 0.98  GLUCOSE 126*     Assessment/Plan: 1 Day Post-Op Procedure(s) (LRB): open reduction internal fixation REVISION RIGHT HIP  (Right) Foley cath d/c'ed Advance diet Up with therapy D/C IV fluids Discharge to SNF when ready  Expected ABLA  Treated with iron and will observe       West Pugh. Nanette Wirsing   PAC  05/15/2013, 8:28 AM

## 2013-05-15 NOTE — Clinical Social Work Psychosocial (Signed)
Clinical Social Work Department BRIEF PSYCHOSOCIAL ASSESSMENT 05/15/2013  Patient:  Derek Carr, Derek Carr     Account Number:  [id number]     Admit date:  05/14/2013  Clinical Social Worker:  Daiva Huge  Date/Time:  05/15/2013 02:05 PM  Referred by:  Physician  Date Referred:  05/15/2013 Referred for  SNF Placement   Other Referral:   Interview type:  Other - See comment Other interview type:   Met with patient, wife of 52 years and daughter- Derek Carr at bedside.    PSYCHOSOCIAL DATA Living Status:  FAMILY Admitted from facility:   Level of care:   Primary support name:  wife and daughter Primary support relationship to patient:  FAMILY Degree of support available:   good    CURRENT CONCERNS Current Concerns  Post-Acute Placement   Other Concerns:    SOCIAL WORK ASSESSMENT / PLAN Met with patient and his wife and daughter to discuss possible SNF at d/c- patient's daughter reports that he has been to [facility] earlier this year after a THR- they had a good experience there and would like to pursue this again-   Assessment/plan status:  Other - See comment Other assessment/ plan:   Will update FL2 for SNF search   Information/referral to community resources:   SNF list  [facility]    PATIENT'S/FAMILY'S RESPONSE TO PLAN OF CARE: Patient voices desire to go home at d/c but understands and agrees to the need to consider a short SNF stay at d/c-  he prefers return to [facility] and [place] will pursue this as well as other options and advise- CSW will also intiitate [facility] for SNF d/c       Eduard Clos, MSW, [place]

## 2013-05-15 NOTE — Care Management Note (Signed)
    Page 1 of 1   05/15/2013     12:02:44 PM CARE MANAGEMENT NOTE 05/15/2013  Patient:  Derek Carr, Derek Carr   Account Number:  [id number]  Date Initiated:  05/15/2013  Documentation initiated by:  Sunday Spillers  Subjective/Objective Assessment:   80 yo male admitted s/p ORIF right periprosthetic femur fracture, Revision right hip. PTA lived at home with spouse.     Action/Plan:   SNF for rehab   Anticipated DC Date:  05/17/2013   Anticipated DC Plan:  SKILLED NURSING FACILITY  In-house referral  Clinical Social Worker      DC Planning Services  CM consult      Choice offered to / List presented to:             Status of service:  Completed, signed off Medicare Important Message given?   (If response is "NO", the following Medicare IM given date fields will be blank) Date Medicare IM given:   Date Additional Medicare IM given:    Discharge Disposition:  [place]  Per UR Regulation:  Reviewed for med. necessity/level of care/duration of stay  If discussed at [place] of Stay Meetings, dates discussed:    Comments:

## 2013-05-15 NOTE — Progress Notes (Signed)
Utilization review completed.  

## 2013-05-15 NOTE — Evaluation (Signed)
Physical Therapy Evaluation Patient Details Name: Derek Carr MRN: [id number] DOB: 17-Feb-1931 Today's Date: 05/15/2013   History of Present Illness  80 yo male s/p R THA revision, ORIF R femur 4/20 secondary to fall with periprosthetic fx. Hx of R direct anterior THA 03/20/13.   Clinical Impression  On eval, pt required Max assist +2 for mobility-able to take a couple of steps with walker. Limited by pain, cognition. Pt has significant difficulty maintaining PWB on R LE/following commands for safe mobility. Pt participated well.     Follow Up Recommendations SNF;Supervision/Assistance - 24 hour    Equipment Recommendations       Recommendations for Other Services OT consult     Precautions / Restrictions Precautions Precautions: Fall Precaution Comments: NO ACTIVE HIP ABDUCTION-spoke with Matt Babish-stated this was only movement precaution Required Braces or Orthoses: Knee Immobilizer - Right Knee Immobilizer - Right: On at all times Restrictions Weight Bearing Restrictions: Yes RLE Weight Bearing: Partial weight bearing RLE Partial Weight Bearing Percentage or Pounds: 25-50%      Mobility  Bed Mobility Overal bed mobility: Needs Assistance Bed Mobility: Supine to Sit;Sit to Supine     Supine to sit: Max assist;+2 for physical assistance;+2 for safety/equipment     General bed mobility comments: Pt required constant cues to not actively abduct.  Therapist maintatined safe positioning of R LE during transition to sitting. Assist for trunk to upright. Increased time. Utilized bed pad for scooting, positioning.   Transfers Overall transfer level: Needs assistance Equipment used: Rolling walker (2 wheeled) Transfers: Sit to/from Stand Sit to Stand: Mod assist;+2 physical assistance;+2 safety/equipment Stand pivot transfers: Max assist;+2 physical assistance;+2 safety/equipment       General transfer comment: MAX cues for safety, technique, sequence, adherence to PWB  status. Pt has significant difficulty maintaining PWB on R LE so activity limited to a couple of steps to practice-deferred further ambulation for safety reasons this session.  Ambulation/Gait   Ambulation Distance (Feet): 2 Feet Assistive device: Rolling walker (2 wheeled) Gait Pattern/deviations: Step-to pattern;Decreased stride length;Decreased step length - right;Decreased step length - left;Antalgic     General Gait Details: Limited distance due to difficulty maintaining PWB status.   Stairs            Wheelchair Mobility    Modified Rankin (Stroke Patients Only)       Balance Overall balance assessment: Needs assistance Sitting-balance support: Bilateral upper extremity supported;Feet supported Sitting balance-Leahy Scale: Fair     Standing balance support: Bilateral upper extremity supported;During functional activity Standing balance-Leahy Scale: Zero Standing balance comment: Pt had to have external support to attempt to stand.                             Pertinent Vitals/Pain 4/10 R hip/LE at rest; Increased pain with movement-unrated    Home Living Family/patient expects to be discharged to:: Skilled nursing facility Living Arrangements: Spouse/significant other                    Prior Function Level of Independence: Independent         Comments: pt was I prior to initial hip surgery and subsequent fall.  Since then has needed some assist with adls.  Pt has been sponge bathing and has been TDWB since the new fx was found.       Hand Dominance   Dominant Hand: Right    Extremity/Trunk Assessment  Upper Extremity Assessment: Defer to OT evaluation           Lower Extremity Assessment: RLE deficits/detail RLE Deficits / Details: hip flex 2-/5, moves ankle well    Cervical / Trunk Assessment: Kyphotic  Communication   Communication: No difficulties  Cognition Arousal/Alertness: Awake/alert Behavior During Therapy:  WFL for tasks assessed/performed Overall Cognitive Status: Impaired/Different from baseline Area of Impairment: Attention;Memory;Problem solving;Safety/judgement   Current Attention Level: Sustained Memory: Decreased recall of precautions;Decreased short-term memory   Safety/Judgement: Decreased awareness of safety;Decreased awareness of deficits   Problem Solving: Slow processing;Difficulty sequencing;Requires verbal cues;Requires tactile cues General Comments: Pt lives with his wife PTA.  Seems to have cognitive problems during hospitalizations but was intact prior to initial hip surgery per daughter.    General Comments General comments (skin integrity, edema, etc.): Pt mildly limited cognitivly today (meds?) and therefore LE adls and mobility were difficult.    Exercises        Assessment/Plan    PT Assessment Patient needs continued PT services  PT Diagnosis Difficulty walking;Abnormality of gait;Generalized weakness;Acute pain;Altered mental status   PT Problem List Decreased strength;Decreased range of motion;Decreased activity tolerance;Decreased balance;Decreased mobility;Decreased knowledge of precautions;Decreased safety awareness;Decreased knowledge of use of DME;Decreased cognition;Pain  PT Treatment Interventions DME instruction;Gait training;Functional mobility training;Therapeutic activities;Therapeutic exercise;Patient/family education;Cognitive remediation;Balance training   PT Goals (Current goals can be found in the Care Plan section) Acute Rehab PT Goals Patient Stated Goal: to be able to walk PT Goal Formulation: With patient/family Time For Goal Achievement: 05/22/13 Potential to Achieve Goals: Good    Frequency Min 5X/week   Barriers to discharge        Co-evaluation               End of Session Equipment Utilized During Treatment: Gait belt Activity Tolerance: Patient limited by pain (Limited by cognition) Patient left: in chair;with call  bell/phone within reach;with family/visitor present Nurse Communication: Precautions;Weight bearing status;Mobility status         Time: 6759-1638 PT Time Calculation (min): 21 min   Charges:   PT Evaluation $Initial PT Evaluation Tier I: 1 Procedure PT Treatments $Gait Training: 8-22 mins   PT G Codes:          Weston Anna, MPT Pager: [phone]

## 2013-05-15 NOTE — Evaluation (Signed)
Occupational Therapy Evaluation Patient Details Name: Derek Carr MRN: [id number] DOB: 06/25/1931 Today's Date: 05/15/2013    History of Present Illness Pt is s/p R hip revision of RTHA.  PW 25-50% and no active abduction   Clinical Impression   Pt admitted with the above revision and has the deficits listed below.  Pt would benefit from cont OT to increase I with basic adls so he can eventually d/c home with his wife after rehab.    Follow Up Recommendations  SNF;Supervision/Assistance - 24 hour    Equipment Recommendations  None recommended by OT    Recommendations for Other Services       Precautions / Restrictions Precautions Precautions: Other (comment);Fall (no active abduction and WB limitations per MD) Precaution Comments: Pt had a fall which caused most recent fx therefore is a fall risk. Required Braces or Orthoses: Knee Immobilizer - Right Knee Immobilizer - Right: On at all times Restrictions Weight Bearing Restrictions: Yes RLE Weight Bearing: Partial weight bearing RLE Partial Weight Bearing Percentage or Pounds: 50      Mobility Bed Mobility Overal bed mobility: Needs Assistance;+2 for physical assistance Bed Mobility: Supine to Sit     Supine to sit: +2 for physical assistance;Max assist     General bed mobility comments: Pt required constant cues to not activity abducation.  Pt able to move Leg on own but required assist to bring upper body around to sit straight and maintain precaustions.  Transfers Overall transfer level: Needs assistance Equipment used: Rolling walker (2 wheeled) Transfers: Stand Pivot Transfers;Sit to/from Stand Sit to Stand: Mod assist;+2 safety/equipment Stand pivot transfers: +2 physical assistance;+2 safety/equipment;Max assist       General transfer comment: Pt had difficulty following directions to advance walker.  Pt was limited by decreased memory and sequencing and problem solving.    Balance Overall balance  assessment: Needs assistance Sitting-balance support: Bilateral upper extremity supported;Feet supported Sitting balance-Leahy Scale: Fair     Standing balance support: Bilateral upper extremity supported;During functional activity Standing balance-Leahy Scale: Zero Standing balance comment: Pt had to have external support to attempt to stand.                            ADL Overall ADL's : Needs assistance/impaired Eating/Feeding: Set up;Sitting   Grooming: Brushing hair;Oral care;Wash/dry face;Wash/dry hands;Set up;Sitting Grooming Details (indicate cue type and reason): unable to do standing b/c cannot maintain WB status Upper Body Bathing: Set up;Sitting;Cueing for safety Upper Body Bathing Details (indicate cue type and reason): cues to keep going and recall what he has and has not washed Lower Body Bathing: Maximal assistance;Adhering to hip precautions;Sit to/from stand Lower Body Bathing Details (indicate cue type and reason): washes to his knees.  Very paintful to wash below knees. Cues for no activte abduction and pt unable to stand to wash LE due to inability to [facility] maintain WB status. Upper Body Dressing : Set up;Sitting   Lower Body Dressing: Total assistance;Cueing for safety;Adhering to hip precautions;Sit to/from stand Lower Body Dressing Details (indicate cue type and reason): pt needs a  lot of assist due to pain and inability to maintain WB status in standing when doing adls in standing. Toilet Transfer: +2 for physical assistance;+2 for safety/equipment;Maximal assistance;Stand-pivot Toilet Transfer Details (indicate cue type and reason): Pt needs assist with WB status and cues to reach back for toilet for safety Toileting- Clothing Manipulation and Hygiene: Maximal assistance;Sit to/from stand Toileting -  Clothing Manipulation Details (indicate cue type and reason): total assist as pt holds to walker just to stand.     Functional mobility during ADLs: +2  for physical assistance;+2 for safety/equipment;Maximal assistance General ADL Comments: Pt does ok with UE adls and grooming/feeding.  All lower body adls are limited due to WB status .     Vision                     Perception     Praxis      Pertinent Vitals/Pain Pt first rated his pain as 4/10 in R hip.  Pt stated it did increase with mobility but did not rate. Pt had pain meds from nursing before OT session.     Hand Dominance Right   Extremity/Trunk Assessment Upper Extremity Assessment Upper Extremity Assessment: Overall WFL for tasks assessed   Lower Extremity Assessment Lower Extremity Assessment: Defer to PT evaluation   Cervical / Trunk Assessment Cervical / Trunk Assessment: Normal   Communication Communication Communication: No difficulties   Cognition Arousal/Alertness: Awake/alert Behavior During Therapy: WFL for tasks assessed/performed Overall Cognitive Status: Impaired/Different from baseline Area of Impairment: Attention;Memory;Problem solving   Current Attention Level: Sustained Memory: Decreased recall of precautions;Decreased short-term memory       Problem Solving: Slow processing;Difficulty sequencing;Requires verbal cues;Requires tactile cues General Comments: Pt lives with his wife PTA.  Seems to have cognitive problems during hospitalizations but was intact prior to initial hip surgery per daughter.   General Comments       Exercises       Shoulder Instructions      Home Living Family/patient expects to be discharged to:: Skilled nursing facility Living Arrangements: Spouse/significant other                                      Prior Functioning/Environment Level of Independence: Independent        Comments: pt was I prior to initial hip surgery and subsequent fall.  Since then has needed some assist with adls.  Pt has been sponge bathing and has been TDWB since the new fx was found.      OT Diagnosis:  Generalized weakness;Cognitive deficits;Acute pain   OT Problem List: Decreased strength;Impaired balance (sitting and/or standing);Decreased cognition;Decreased safety awareness;Decreased knowledge of use of DME or AE;Decreased knowledge of precautions;Pain   OT Treatment/Interventions: Self-care/ADL training;DME and/or AE instruction;Therapeutic activities    OT Goals(Current goals can be found in the care plan section) Acute Rehab OT Goals Patient Stated Goal: to be able to walk OT Goal Formulation: With patient/family Time For Goal Achievement: 05/29/13 Potential to Achieve Goals: Good ADL Goals Pt Will Perform Grooming: with min guard assist;standing Pt Will Perform Lower Body Bathing: with min assist;sit to/from stand Pt Will Perform Lower Body Dressing: with mod assist;with adaptive equipment;sit to/from stand Pt Will Transfer to Toilet: with min assist;ambulating;bedside commode Pt Will Perform Tub/Shower Transfer: Shower transfer;3 in 1;rolling walker;ambulating;with mod assist Additional ADL Goal #1: Pt will stand at sink to groom with min cues/assist to maintain PWB.  OT Frequency: Min 2X/week   Barriers to D/C: Decreased caregiver support  wife only one available to assist at home.       Co-evaluation              End of Session Equipment Utilized During Treatment: Rolling walker;Right knee immobilizer Nurse Communication: Mobility status;Weight bearing status  Activity Tolerance: Patient tolerated treatment well;Patient limited by pain Patient left: in chair;with call bell/phone within reach;with family/visitor present   Time: 0938-1000 OT Time Calculation (min): 22 min Charges:  OT General Charges $OT Visit: 1 Procedure OT Evaluation $Initial OT Evaluation Tier I: 1 Procedure OT Treatments $Self Care/Home Management : 8-22 mins G-Codes:    Vickki Muff 09-Jun-2013, 11:06 AM [phone]

## 2013-05-15 NOTE — Op Note (Signed)
Derek Carr, Derek Carr NO.:  [id number]  MEDICAL RECORD NO.:  [id number]  LOCATION:  7371                         FACILITY:  [facility]  PHYSICIAN:  Pietro Cassis. Alvan Dame, M.D.  DATE OF BIRTH:  1931/04/11  DATE OF PROCEDURE:  05/14/2013 DATE OF DISCHARGE:                              OPERATIVE REPORT   PREOPERATIVE DIAGNOSIS:  Right periprosthetic femur fracture.  POSTOPERATIVE DIAGNOSIS:  Right periprosthetic femur fracture.  PROCEDURES: 1. Open reduction and internal fixation of right periprosthetic femur     fracture utilizing 4 Zimmer cables. 2. Revision right hip, 1 component femoral head ball up sized to a 36,     1.5 to a 36 +5 ball.  FINDINGS:  The patient was noted to have early malunion of his proximal femur fracture with no evidence any loosening or further subsidence of his femoral component.  Please see dictated body of the report for further details.  SURGEON:  Pietro Cassis. Alvan Dame, M.D.  ASSISTANT:  Danae Orleans, PA.  Note that [name] was present for the entirety of the case from preoperative position, perioperative management of the operative extremity, general facilitation of the case, and primary wound closure.  ANESTHESIA:  General.  SPECIMENS:  None.  COMPLICATIONS:  None.  DRAINS:  None.  BLOOD LOSS:  About 1 L.  INDICATIONS FOR PROCEDURE:  Derek Carr is a very pleasant, 80 year old male who is now about 2 months out from index right total hip arthroplasty.  Unfortunately while in the hospital, he had a fall from a chair down to the floor.  At that time, he was able to function and thus, no radiographic assessment was carried out.  However, when he was seen back in his first followup visit, he was noted to have a periprosthetic fracture.  I had lengthy discussions with family regarding course of action and treatment.  After some time, a second opinion consultation opted to proceed with open reduction and internal fixation.  We  discussed the implications of duration of time from the injury to the time of this procedure in terms of take down the fracture site, immobilization of bone, and assessment of the components.  Risks of infection, DVT, component failure, dislocation, need for future visits were all reviewed and discussed as well as the comorbid and implications of going through this procedure again.  DESCRIPTION OF PROCEDURE:  The patient was brought into the operative theater.  Once adequate anesthesia, preoperative antibiotics, Ancef administered, he was positioned into the left lateral decubitus position with his right side up.  The right lower extremity was then prepped and draped in sterile fashion.  A time-out was performed identifying the patient, planned procedure, and extremity.  A lateral based incision was made for posterior approach to the hip. The soft tissues were dissected down to the iliotibial band and gluteal fascia.  These were then incised posteriorly.  After some time of initial dissection, we identified the anatomy of the proximal femur.  The greater trochanter segment had been sagittally split and had displaced anteriorly as opposed to posteriorly.  The initial portion of the case was carried out to exposure identifying the anatomy in terms of the fracture.  Once this was identified from its distal extent proximally, I was able to then immobilize the fractured segment taking down the early union as a malunion in order to mobilize this fragment.  Using bone holding clamps, I was able to mobilize this fragment into a near anatomic position.  Once I had in a position that I felt was appropriate for his anatomy, we passed cables around this segment, initially 3 of them, one proximal lesser and 2 distal.  Attention knees down initially and took a radiograph.  Intraoperative radiograph revealed still some mild elevation of the bone fragments off of the shaft of the femur,  however, under my direct visualization, had bone contacts.  With these cables in place, I then dislocated the hip, evaluated the acetabulum, removed posterior osteophytes.  I dislocated the femoral head and removed the old femoral head as I was going to be planned based on radiographic evaluation preoperatively to up size to a +5 ball to restore length.  A trial of 36 +5 ball was placed.  The hip was then reduced and further attention was directed at the fractured segment.  At this point, I released the pre-tensioned cables and further mobilized the fragment and applied tension with bone clamp to further attempt to reduce the fracture.  Again under direct visualization, there was excellent bony contact.  I felt at this point, based on what I was visualizing that there was adequate bone contact for ultimate healing. I placed a fourth cable proximal trochanter, gave me 4 points of fixation for this fractured segment.  Each at this point was tensioned sequentially from distal to proximal.  They were then tightened and cables cut.  At this point, mobilizing the femur showed no immobilization or movement of the fractured segment.  I then dislocated the trial head and replaced this with a 36 +5 ball.  This was impacted onto a clean and dry trunnion.  Please note that findings included at this point, a stable femoral component without evidence of any movement at all despite all the activity was being carried out.  I assessed this repeatedly to make certain that it was stable.  I did use intraoperative radiographs to assess the reduction of the greater trochanter and felt very comfortable this.  I also assessed the range of motion and found no sources of impingement with forward flexion or with extension external rotation.  Given all these findings, I felt that we at this point had restored his anatomy to a near anatomic position to allow for adequate healing and improved gluteal  function and strength.  The wound was irrigated throughout the case again at this point.  I reapproximated posterior capsular tissues using #1 Vicryl.  The iliotibial band and gluteal fascia were then reapproximated using #1 Vicryl in running 0 V-Loc.  The remainder of wound was closed with 2-0 Vicryl and running 3-0 Monocryl.  The hip was cleaned, dried, dressed sterilely using Dermabond on 14-inch Aquacel.  Postoperatively, we will have him be 25-50% weightbearing, limited in abduction exercises and movement.  The discharge plan will follow.  Please based on previous use of medication in the hospital, he will be placed on tramadol for pain control.  Findings will be reviewed with family.     Pietro Cassis Alvan Dame, M.D.     MDO/MEDQ  D:  05/14/2013  T:  05/15/2013  Job:  [id number]

## 2013-05-15 NOTE — Clinical Social Work Placement (Addendum)
Clinical Social Work Department CLINICAL SOCIAL WORK PLACEMENT NOTE 05/15/2013  Patient:  Derek Carr, Derek Carr  Account Number:  [id number] Admit date:  05/14/2013  Clinical Social Worker:  Daiva Huge  Date/time:  05/15/2013 02:14 PM  Clinical Social Work is seeking post-discharge placement for this patient at the following level of care:   SKILLED NURSING   (*CSW will update this form in Epic as items are completed)   05/15/2013  Patient/family provided with [place] Department of Clinical Social Work's list of facilities offering this level of care within the geographic area requested by the patient (or if unable, by the patient's family).  05/15/2013  Patient/family informed of their freedom to choose among providers that offer the needed level of care, that participate in Medicare, Medicaid or managed care program needed by the patient, have an available bed and are willing to accept the patient.  05/15/2013  Patient/family informed of [facility]' ownership interest in [facility], as well as of the fact that they are under no obligation to receive care at this facility.  PASARR submitted to EDS on 05/15/2013 PASARR number received from EDS on 05/15/2013  FL2 transmitted to all facilities in geographic area requested by pt/family on  05/15/2013 FL2 transmitted to all facilities within larger geographic area on   Patient informed that his/her managed care company has contracts with or will negotiate with  certain facilities, including the following:     Patient/family informed of bed offers received:  05/16/2013 Patient chooses bed at [facility] Physician recommends and patient chooses bed at    Patient to be transferred to  on  [facility] on 05/17/2013  Patient to be transferred to facility by ambulance Corey Harold)  The following physician request were entered in Epic:   Additional Comments: Alison Murray, MSW, LCSW Clinical Social Work Coverage for [employer], [place]

## 2013-05-16 LAB — CBC
HCT: 19.8 % — ABNORMAL LOW (ref 39.0–52.0)
Hemoglobin: 6.7 g/dL — CL (ref 13.0–17.0)
MCH: 28.2 pg (ref 26.0–34.0)
MCHC: 33.8 g/dL (ref 30.0–36.0)
MCV: 83.2 fL (ref 78.0–100.0)
PLATELETS: 155 10*3/uL (ref 150–400)
RBC: 2.38 MIL/uL — ABNORMAL LOW (ref 4.22–5.81)
RDW: 15.7 % — ABNORMAL HIGH (ref 11.5–15.5)
WBC: 6.9 10*3/uL (ref 4.0–10.5)

## 2013-05-16 LAB — BASIC METABOLIC PANEL
BUN: 17 mg/dL (ref 6–23)
CHLORIDE: 107 meq/L (ref 96–112)
CO2: 24 mEq/L (ref 19–32)
Calcium: 8.6 mg/dL (ref 8.4–10.5)
Creatinine, Ser: 1.24 mg/dL (ref 0.50–1.35)
GFR calc non Af Amer: 54 mL/min — ABNORMAL LOW (ref >90)
GFR, EST AFRICAN AMERICAN: 62 mL/min — AB (ref >90)
Glucose, Bld: 113 mg/dL — ABNORMAL HIGH (ref 70–99)
Potassium: 4.3 mEq/L (ref 3.7–5.3)
SODIUM: 139 meq/L (ref 137–147)

## 2013-05-16 LAB — PREPARE RBC (CROSSMATCH)

## 2013-05-16 NOTE — Progress Notes (Signed)
   Subjective: 2 Days Post-Op Procedure(s) (LRB): open reduction internal fixation REVISION RIGHT HIP  (Right)   Patient reports pain as mild, pain controlled. H&H dropped during the night, but otherwise no events. Feels that is general he is doing well.  We have discussed receiving blood this morning and ready to do so.  Objective:   VITALS:   Filed Vitals:   05/16/13 0800  BP: 110/66  Pulse: 79  Temp: 98.3 F (36.8 C)  Resp: 15    Neurovascular intact Dorsiflexion/Plantar flexion intact Incision: dressing C/D/I No cellulitis present Compartment soft  LABS  Recent Labs  05/15/13 0434 05/16/13 0421  HGB 8.0* 6.7*  HCT 24.5* 19.8*  WBC 5.7 6.9  PLT 205 155     Recent Labs  05/15/13 0434 05/16/13 0421  NA 141 139  K 4.3 4.3  BUN 13 17  CREATININE 0.98 1.24  GLUCOSE 126* 113*     Assessment/Plan: 2 Days Post-Op Procedure(s) (LRB): open reduction internal fixation REVISION RIGHT HIP  (Right) Up with therapy Discharge to SNF eventually, when ready  ABLA  To receive 2 units of blood today, will follow with labs tomorrow.       [place] Henreitta Spittler   PAC  05/16/2013, 11:03 AM

## 2013-05-16 NOTE — Progress Notes (Signed)
Physical Therapy Treatment Patient Details Name: Derek Carr MRN: [id number] DOB: 1931-06-30 Today's Date: 05/16/2013    History of Present Illness 80 yo male s/p R THA revision, ORIF R femur 4/20 secondary to fall with periprosthetic fx. Hx of R direct anterior THA 03/20/13.     PT Comments    Marked improvement in activity tolerance with increased ability to follow cues for PWB.  Follow Up Recommendations  SNF;Supervision/Assistance - 24 hour     Equipment Recommendations  None recommended by PT    Recommendations for Other Services OT consult     Precautions / Restrictions Precautions Precautions: Fall Precaution Booklet Issued: Yes (comment) Precaution Comments: NO ACTIVE HIP ABDUCTION Required Braces or Orthoses: Knee Immobilizer - Right Knee Immobilizer - Right: On at all times Restrictions Weight Bearing Restrictions: Yes RLE Weight Bearing: Partial weight bearing RLE Partial Weight Bearing Percentage or Pounds: 25-50%    Mobility  Bed Mobility Overal bed mobility: Needs Assistance Bed Mobility: Supine to Sit     Supine to sit: Mod assist     General bed mobility comments: Cues for sequencing, use of L LE to self assist   Transfers Overall transfer level: Needs assistance Equipment used: Rolling walker (2 wheeled) Transfers: Sit to/from Stand Sit to Stand: Min assist;Mod assist;+2 physical assistance;+2 safety/equipment         General transfer comment: cues for use of UEs to self assist and for LE management  Ambulation/Gait Ambulation/Gait assistance: +2 physical assistance;Min assist;Mod assist Ambulation Distance (Feet): 40 Feet Assistive device: Rolling walker (2 wheeled) Gait Pattern/deviations: Step-to pattern;Decreased step length - right;Decreased step length - left;Shuffle;Trunk flexed     General Gait Details: Cues for sequence, position from RW and UE WB with pt demonstrating much better follow through with [facility]   Stairs             Wheelchair Mobility    Modified Rankin (Stroke Patients Only)       Balance                                    Cognition Arousal/Alertness: Awake/alert Behavior During Therapy: WFL for tasks assessed/performed Overall Cognitive Status: History of cognitive impairments - at baseline Area of Impairment: Attention;Memory;Problem solving;Safety/judgement   Current Attention Level: Sustained Memory: Decreased recall of precautions;Decreased short-term memory   Safety/Judgement: Decreased awareness of safety;Decreased awareness of deficits   Problem Solving: Slow processing;Difficulty sequencing;Requires verbal cues;Requires tactile cues General Comments: Pt lives with his wife PTA.  Seems to have cognitive problems during hospitalizations but was intact prior to initial hip surgery per daughter.    Exercises General Exercises - Lower Extremity Ankle Circles/Pumps: AROM;15 reps;Both;Supine Quad Sets: AROM;Both;10 reps;Supine Heel Slides: AAROM;15 reps;Supine;Right    General Comments        Pertinent Vitals/Pain Min c/o pain    Home Living                      Prior Function            PT Goals (current goals can now be found in the care plan section) Acute Rehab PT Goals Patient Stated Goal: to be able to walk PT Goal Formulation: With patient/family Time For Goal Achievement: 05/22/13 Potential to Achieve Goals: Good Progress towards PT goals: Progressing toward goals    Frequency  Min 5X/week    PT Plan Current plan remains appropriate  Co-evaluation             End of Session Equipment Utilized During Treatment: Gait belt Activity Tolerance: Patient tolerated treatment well Patient left: in chair;with call bell/phone within reach;with family/visitor present     Time: 1540-1606 PT Time Calculation (min): 26 min  Charges:  $Gait Training: 8-22 mins $Therapeutic Exercise: 8-22 mins                    G Codes:       [name] 05/16/2013, 4:19 PM

## 2013-05-16 NOTE — Progress Notes (Signed)
CSW continuing to follow.   CSW met with pt and pt wife at bedside.  Pt would like rehab at [facility] confirmed with pt and pt wife that facility had offered a bed.   CSW left message with admissions at [facility] notifying pt acceptance of bed offer.  CSW submitted clinicals to [facility] for initiation of insurance authorization.  CSW to continue to follow and facilitate pt discharge needs when pt medically ready for discharge and insurance authorization received.  Alison Murray, MSW, LCSW Clinical Social Work Coverage for [employer], [place]

## 2013-05-17 LAB — CBC
HCT: 24.7 % — ABNORMAL LOW (ref 39.0–52.0)
HEMOGLOBIN: 8.4 g/dL — AB (ref 13.0–17.0)
MCH: 27.5 pg (ref 26.0–34.0)
MCHC: 34 g/dL (ref 30.0–36.0)
MCV: 80.7 fL (ref 78.0–100.0)
Platelets: 153 10*3/uL (ref 150–400)
RBC: 3.06 MIL/uL — ABNORMAL LOW (ref 4.22–5.81)
RDW: 15.1 % (ref 11.5–15.5)
WBC: 6 10*3/uL (ref 4.0–10.5)

## 2013-05-17 LAB — TYPE AND SCREEN
ABO/RH(D): O POS
Antibody Screen: NEGATIVE
UNIT DIVISION: 0
Unit division: 0

## 2013-05-17 MED ORDER — TIZANIDINE HCL 4 MG PO CAPS: 4.0000 mg | ORAL_CAPSULE | Freq: Three times a day (TID) | ORAL | Status: DC | PRN

## 2013-05-17 MED ORDER — ASPIRIN 325 MG PO TBEC: 325.0000 mg | DELAYED_RELEASE_TABLET | Freq: Two times a day (BID) | ORAL | Status: AC

## 2013-05-17 MED ORDER — FERROUS SULFATE 325 (65 FE) MG PO TABS: 325.0000 mg | ORAL_TABLET | Freq: Three times a day (TID) | ORAL | Status: DC

## 2013-05-17 MED ORDER — POLYETHYLENE GLYCOL 3350 17 G PO PACK: 17.0000 g | PACK | Freq: Two times a day (BID) | ORAL | Status: DC

## 2013-05-17 MED ORDER — ACETAMINOPHEN 500 MG PO TABS: 1000.0000 mg | ORAL_TABLET | Freq: Three times a day (TID) | ORAL | Status: DC | PRN

## 2013-05-17 MED ORDER — DSS 100 MG PO CAPS: 100.0000 mg | ORAL_CAPSULE | Freq: Two times a day (BID) | ORAL | Status: DC

## 2013-05-17 MED ORDER — TRAMADOL HCL 50 MG PO TABS: 50.0000 mg | ORAL_TABLET | Freq: Four times a day (QID) | ORAL | Status: DC | PRN

## 2013-05-17 NOTE — Progress Notes (Signed)
Discharge summary sent to payer through [facility]  

## 2013-05-17 NOTE — Progress Notes (Signed)
Physical Therapy Treatment Patient Details Name: Derek Carr MRN: [id number] DOB: May 30, 1931 Today's Date: 05/17/2013    History of Present Illness 80 yo male s/p R THA revision, ORIF R femur 4/20 secondary to fall with periprosthetic fx. Hx of R direct anterior THA 03/20/13.     PT Comments    Pt continues to progress but limited by memory issues and mildly impulsive.  Follow Up Recommendations  SNF;Supervision/Assistance - 24 hour     Equipment Recommendations  None recommended by PT    Recommendations for Other Services OT consult     Precautions / Restrictions Precautions Precautions: Fall;Posterior Hip Precaution Booklet Issued: Yes (comment) Precaution Comments: NO ACTIVE HIP ABDUCTION Required Braces or Orthoses: Knee Immobilizer - Right Knee Immobilizer - Right: On at all times Restrictions Weight Bearing Restrictions: Yes RLE Weight Bearing: Partial weight bearing RLE Partial Weight Bearing Percentage or Pounds: 25-50%    Mobility  Bed Mobility Overal bed mobility: Needs Assistance Bed Mobility: Supine to Sit     Supine to sit: Min assist;Mod assist     General bed mobility comments: Cues for sequencing, use of L LE to self assist   Transfers Overall transfer level: Needs assistance Equipment used: Rolling walker (2 wheeled) Transfers: Sit to/from Stand Sit to Stand: Min assist;Mod assist;+2 physical assistance;+2 safety/equipment         General transfer comment: cues for use of UEs to self assist and for LE management  Ambulation/Gait Ambulation/Gait assistance: Min assist;+2 safety/equipment Ambulation Distance (Feet): 60 Feet (and 15) Assistive device: Rolling walker (2 wheeled) Gait Pattern/deviations: Step-to pattern;Decreased step length - right;Decreased step length - left;Shuffle;Trunk flexed     General Gait Details: Cues for sequence, position from RW and UE WB with pt demonstrating much better follow through with [facility]   Stairs            Wheelchair Mobility    Modified Rankin (Stroke Patients Only)       Balance                                    Cognition Arousal/Alertness: Awake/alert Behavior During Therapy: WFL for tasks assessed/performed Overall Cognitive Status: History of cognitive impairments - at baseline Area of Impairment: Attention;Memory;Problem solving;Safety/judgement   Current Attention Level: Sustained Memory: Decreased recall of precautions;Decreased short-term memory   Safety/Judgement: Decreased awareness of safety;Decreased awareness of deficits   Problem Solving: Slow processing;Difficulty sequencing;Requires verbal cues;Requires tactile cues General Comments: Pt lives with his wife PTA.  Seems to have cognitive problems during hospitalizations but was intact prior to initial hip surgery per daughter.    Exercises General Exercises - Lower Extremity Ankle Circles/Pumps: AROM;15 reps;Both;Supine Quad Sets: AROM;Both;10 reps;Supine Heel Slides: AAROM;15 reps;Supine;Right    General Comments        Pertinent Vitals/Pain Min c/o pain    Home Living                      Prior Function            PT Goals (current goals can now be found in the care plan section) Acute Rehab PT Goals Patient Stated Goal: to be able to walk PT Goal Formulation: With patient/family Time For Goal Achievement: 05/22/13 Potential to Achieve Goals: Good Progress towards PT goals: Progressing toward goals    Frequency  Min 5X/week    PT Plan Current plan remains appropriate  Co-evaluation             End of Session Equipment Utilized During Treatment: Gait belt Activity Tolerance: Patient tolerated treatment well Patient left: Other (comment) (in bathroom with CNA to bathe)     Time: 0820-0850 PT Time Calculation (min): 30 min  Charges:  $Gait Training: 8-22 mins $Therapeutic Exercise: 8-22 mins                    G Codes:      Derek Carr 05/17/2013, 9:07 AM

## 2013-05-17 NOTE — Progress Notes (Signed)
   Subjective: 3 Days Post-Op Procedure(s) (LRB): open reduction internal fixation REVISION RIGHT HIP  (Right)   Patient reports pain as mild, pain controlled. No events throughout the night. Feels better after receiving blood yesterday. Feels that he did well with PT yesterday.  Ready to be discharged to SNF.  Objective:   VITALS:   Filed Vitals:   05/17/13 0620  BP: 164/84  Pulse: 62  Temp: 98.5 F (36.9 C)  Resp: 18    Neurovascular intact Dorsiflexion/Plantar flexion intact Incision: dressing C/D/I No cellulitis present Compartment soft  LABS  Recent Labs  05/15/13 0434 05/16/13 0421 05/17/13 0457  HGB 8.0* 6.7* 8.4*  HCT 24.5* 19.8* 24.7*  WBC 5.7 6.9 6.0  PLT 205 155 153     Recent Labs  05/15/13 0434 05/16/13 0421  NA 141 139  K 4.3 4.3  BUN 13 17  CREATININE 0.98 1.24  GLUCOSE 126* 113*     Assessment/Plan: 3 Days Post-Op Procedure(s) (LRB): open reduction internal fixation REVISION RIGHT HIP  (Right) Up with therapy Discharge to SNF Follow up in 2 weeks at [facility]. Follow up with OLIN,Eduar Kumpf D in 2 weeks.  Contact information:  [facility] [address] [place]   PAC  05/17/2013, 8:41 AM

## 2013-05-17 NOTE — Progress Notes (Signed)
Occupational Therapy Treatment Patient Details Name: Derek Carr MRN: [id number] DOB: 01/18/1932 Today's Date: 05/17/2013    History of present illness 80 yo male s/p R THA revision, ORIF R femur 4/20 secondary to fall with periprosthetic fx. Hx of R direct anterior THA 03/20/13.       Follow Up Recommendations  SNF;Supervision/Assistance - 24 hour    Equipment Recommendations  None recommended by OT       Precautions / Restrictions Precautions Precautions: Fall;Posterior Hip Precaution Booklet Issued: Yes (comment) Precaution Comments: NO ACTIVE HIP ABDUCTION Required Braces or Orthoses: Knee Immobilizer - Right Knee Immobilizer - Right: On at all times Restrictions Weight Bearing Restrictions: Yes RLE Weight Bearing: Partial weight bearing RLE Partial Weight Bearing Percentage or Pounds: 25-50%       Mobility Bed Mobility Overal bed mobility: Needs Assistance Bed Mobility: Supine to Sit     Supine to sit: Min assist;Mod assist     General bed mobility comments: Cues for sequencing, use of L LE to self assist   Transfers Overall transfer level: Needs assistance Equipment used: Rolling walker (2 wheeled) Transfers: Sit to/from Stand Sit to Stand: Min assist;Mod assist;+2 physical assistance;+2 safety/equipment         General transfer comment: cues for use of UEs to self assist and for LE management        ADL                                         General ADL Comments: OT went over ADL activity and hip precautions. Pt needed mod VC to apply precautions to hip precautions. During session- did reinterate therapist at SNF would continue to help pt recall hip precautions, Encouraged pt and wife to look at handout often. Pt and wife agreed.                  Cognition   Behavior During Therapy: WFL for tasks assessed/performed Overall Cognitive Status: Within Functional Limits for tasks assessed Area of Impairment: Memory;Problem  solving;Safety/judgement   Current Attention Level: Focused Memory: Decreased recall of precautions    Safety/Judgement: Decreased awareness of safety;Decreased awareness of deficits   Problem Solving: Requires verbal cues General Comments: Pt lives with his wife PTA.  Seems to have cognitive problems during hospitalizations but was intact prior to initial hip surgery per daughter.      Exercises General Exercises - Lower Extremity Ankle Circles/Pumps: AROM;15 reps;Both;Supine Quad Sets: AROM;Both;10 reps;Supine Heel Slides: AAROM;15 reps;Supine;Right         Progress Toward Goals  OT Goals(current goals can now be found in the care plan section)  Progress towards OT goals: Progressing toward goals  Acute Rehab OT Goals Patient Stated Goal: to be able to walk  Plan Discharge plan remains appropriate             Patient Left in bed with call bell and wife present           Time: 1040-1050 OT Time Calculation (min): 10 min  Charges: OT General Charges $OT Visit: 1 Procedure OT Treatments $Self Care/Home Management : 8-22 mins  Derek Carr 05/17/2013, 11:05 AM

## 2013-05-17 NOTE — Progress Notes (Signed)
Pt for discharge to [facility].  CSW facilitated pt discharge needs including contacting facility, confirming facility received insurance authorization from [facility], and providing RN phone number to call report.  CSW met at length with pt and pt wife at bedside to facilitate pt discharge. Pt and pt wife had planned for pt to transport via ambulance. Pt daughter, [name] contacted unit and notified this CSW that she wanted to transport by car as pt daughter was concerned about potential cost of ambulance transport. CSW discussed safety concerns about pt transporting via private vehicle and that [facility] will provide ambulance authorization, but CSW is unsure if the transport will be covered 100% even with insurance authorization. CSW spoke with pt while pt daughter on telephone and pt discussed that he felt more safe transporting via ambulance. Pt daughter expressed understanding, but wished to speak with PT and RN. CSW located PT and RN for pt daughter to have discussion with. CSW spoke with pt daughter, [name] following her discussion with PT and RN and pt daughter is in agreement for pt to be transported to [facility] via non-emergency ambulance. CSW notified pt daughter that pt wife planned to ride with pt in ambulance to [facility] and pt wife had contact information for hospital security to contact to get her keys/car upon return to the hospital this evening. Pt daughter expressed understanding.  Unit RN and Agricultural consultant updated.  Ambulance transport arranged via [place].  No further social work needs identified at this time.  CSW signing off.   Alison Murray, MSW, LCSW Clinical Social Work Coverage for [employer], [place]

## 2013-05-17 NOTE — Discharge Summary (Signed)
Physician Discharge Summary  Patient ID: Derek Carr MRN: [id number] DOB/AGE: [birth year] 80 y.o.  Admit date: 05/14/2013 Discharge date:  05/17/2013  Procedures:  Procedure(s) (LRB): open reduction internal fixation REVISION RIGHT HIP  (Right)  Attending Physician:  Dr. Paralee Cancel   Admission Diagnoses:   Right hip pain  Discharge Diagnoses:  Principal Problem:   S/P right TH revision Active Problems:   Expected blood loss anemia  Past Medical History  Diagnosis Date  . Hyperlipidemia   . Hypertension   . Erectile dysfunction   . Prostate cancer 05/11/2011    bx=Adenocarcinoma,gleason3+4=7, 4+4=8,PSA=10.30volume=45.7cc  . History of asbestos exposure   . Nocturia   . History of shingles 2012-- BILATERAL EYES--  NO RESIDUAL  . Dermatitis, atopic ARMS AND LEGS  . OA (osteoarthritis) of hip RIGHT  . Arthritis   . Frequency of urination   . Smokers' cough   . Lumbar scoliosis   . History of radiation therapy 8/19-13-10/18/11    prostate, 37 GY  . Glaucoma   . DDD (degenerative disc disease)   . Diverticulosis   . Atherosclerosis   . Anemia   . Complication of anesthesia     CONFUSION - Pt's family very concerned about this    HPI: Derek Carr is a pleasant 80 year old male who is present with his wife and his daughters to discuss his right periprosthetic femur fracture. This is classified at this point as a Type B fracture with an avulsed greater trochanter but it also extends down into the area of his prosthesis. Based on the radiographs today (as will be noted in the assessment and plan) this appears to be a stable B as the component does not have any evidence of subsidence or early failure.  To review, Derek Carr had his total hip arthroplasty performed about several weeks ago. In the hospital he had a fall when he slipped out of a chair and landed on his buttock area. There was some increased pain but he was able to perform physical therapy and thus  radiographic evaluation was not carried out per my decision.  None the less, he was seen back in the office and due to limitations that he and his family discussed, I ordered an early X-ray at two weeks out which confirmed this right periprosthetic fracture. We had a lengthy discussion then as well as in followup today to review these findings with the family and discuss options.  This condition presents safety issues increasing the risk of falls. This patient has had proximal femur fracture. There is no current active infection.  PCP: Jerlyn Ly, MD   Discharged Condition: good  Hospital Course:  Patient underwent the above stated procedure on 05/14/2013. Patient tolerated the procedure well and brought to the recovery room in good condition and subsequently to the floor.  POD #1 BP: 121/71 ; Pulse: 88 ; Temp: 98.2 F (36.8 C) ; Resp: 18  Patient reports pain as mild, pain controlled. No events throughout the night. Discussed H&H, will continue to watch. Neurovascular intact, dorsiflexion/plantar flexion intact, incision: dressing C/D/I, no cellulitis present and compartment soft.   LABS  Basename    HGB  8.0  HCT  24.5   POD #2  BP: 110/66 ; Pulse: 79 ; Temp: 98.3 F (36.8 C) ; Resp: 15  Patient reports pain as mild, pain controlled. H&H dropped during the night, but otherwise no events. Feels that is general he is doing well. We have discussed receiving blood  this morning and ready to do so. Neurovascular intact, dorsiflexion/plantar flexion intact, incision: dressing C/D/I, no cellulitis present and compartment soft.   LABS  Basename    HGB  6.7  HCT  19.8   POD #3  BP: 164/84 ; Pulse: 62 ; Temp: 98.5 F (36.9 C) ; Resp: 18  Patient reports pain as mild, pain controlled. No events throughout the night. Feels better after receiving blood yesterday. Feels that he did well with PT yesterday. Ready to be discharged to SNF. Neurovascular intact, dorsiflexion/plantar flexion intact,  incision: dressing C/D/I, no cellulitis present and compartment soft.   LABS  Basename    HGB  8.4  HCT  24.7   Discharge Exam: General appearance: alert, cooperative and no distress Extremities: Homans sign is negative, no sign of DVT, no edema, redness or tenderness in the calves or thighs and no ulcers, gangrene or trophic changes  Disposition:   Skilled nursing facility with follow up in 2 weeks   Follow-up Information   Follow up with [name], MD. Schedule an appointment as soon as possible for a visit in 2 weeks.   Specialty:  Orthopedic Surgery   Contact information:   [address] [place] [address] [phone]       Discharge Orders   Future Orders Complete By Expires   Call MD / Call 911  As directed    Change dressing  As directed    Constipation Prevention  As directed    Diet - low sodium heart healthy  As directed    Discharge instructions  As directed    Driving restrictions  As directed    Partial weight bearing  As directed    Questions:     % Body Weight:  25-50   Laterality:  right   Extremity:  Lower   TED hose  As directed         Medication List    STOP taking these medications       aspirin 325 MG tablet  Replaced by:  aspirin 325 MG EC tablet      TAKE these medications       acetaminophen 500 MG tablet  Commonly known as:  TYLENOL  Take 2 tablets (1,000 mg total) by mouth every 8 (eight) hours as needed for mild pain.     aspirin 325 MG EC tablet  Take 1 tablet (325 mg total) by mouth 2 (two) times daily.     bicalutamide 50 MG tablet  Commonly known as:  CASODEX  Take 50 mg by mouth every morning.     calcium carbonate 500 MG chewable tablet  Commonly known as:  TUMS - dosed in mg elemental calcium  Chew 1 tablet by mouth daily.     cholecalciferol 1000 UNITS tablet  Commonly known as:  VITAMIN D  Take 1,000 Units by mouth daily.     COMBIGAN 0.2-0.5 % ophthalmic solution  Generic drug:   brimonidine-timolol  Place 1 drop into both eyes every 12 (twelve) hours.     DSS 100 MG Caps  Take 100 mg by mouth 2 (two) times daily.     ezetimibe 10 MG tablet  Commonly known as:  ZETIA  Take 10 mg by mouth every morning.     ferrous sulfate 325 (65 FE) MG tablet  Take 1 tablet (325 mg total) by mouth 3 (three) times daily after meals.     folic acid 1 MG tablet  Commonly known as:  FOLVITE  Take 1 mg by mouth daily.     polyethylene glycol packet  Commonly known as:  MIRALAX / GLYCOLAX  Take 17 g by mouth 2 (two) times daily.     pravastatin 80 MG tablet  Commonly known as:  PRAVACHOL  Take 80 mg by mouth every evening.     tamsulosin 0.4 MG Caps capsule  Commonly known as:  FLOMAX  Take 0.4 mg by mouth 2 (two) times daily.     tiZANidine 4 MG capsule  Commonly known as:  ZANAFLEX  Take 1 capsule (4 mg total) by mouth 3 (three) times daily as needed for muscle spasms.     traMADol 50 MG tablet  Commonly known as:  ULTRAM  Take 1-2 tablets (50-100 mg total) by mouth every 6 (six) hours as needed for moderate pain or severe pain.     valACYclovir 500 MG tablet  Commonly known as:  VALTREX  Take 500 mg by mouth 2 (two) times daily.         Signed: West Pugh. Alfred Eckley   PAC  05/17/2013, 8:48 AM

## 2013-05-18 ENCOUNTER — Encounter: Payer: Self-pay | Admitting: Adult Health

## 2013-05-18 ENCOUNTER — Non-Acute Institutional Stay (SKILLED_NURSING_FACILITY): Payer: Medicare Other | Admitting: Adult Health

## 2013-05-18 DIAGNOSIS — M1610 Unilateral primary osteoarthritis, unspecified hip: Secondary | ICD-10-CM

## 2013-05-18 DIAGNOSIS — N400 Enlarged prostate without lower urinary tract symptoms: Secondary | ICD-10-CM | POA: Insufficient documentation

## 2013-05-18 DIAGNOSIS — E785 Hyperlipidemia, unspecified: Secondary | ICD-10-CM

## 2013-05-18 DIAGNOSIS — C61 Malignant neoplasm of prostate: Secondary | ICD-10-CM

## 2013-05-18 DIAGNOSIS — K5900 Constipation, unspecified: Secondary | ICD-10-CM

## 2013-05-18 DIAGNOSIS — Z8619 Personal history of other infectious and parasitic diseases: Secondary | ICD-10-CM

## 2013-05-18 DIAGNOSIS — H409 Unspecified glaucoma: Secondary | ICD-10-CM

## 2013-05-18 DIAGNOSIS — D500 Iron deficiency anemia secondary to blood loss (chronic): Secondary | ICD-10-CM

## 2013-05-18 DIAGNOSIS — Z96649 Presence of unspecified artificial hip joint: Secondary | ICD-10-CM

## 2013-05-18 DIAGNOSIS — M169 Osteoarthritis of hip, unspecified: Secondary | ICD-10-CM

## 2013-05-18 MED ORDER — ACETAMINOPHEN 500 MG PO TABS: 1000.0000 mg | ORAL_TABLET | Freq: Three times a day (TID) | ORAL | Status: DC

## 2013-05-18 NOTE — Progress Notes (Signed)
Patient ID: Derek Carr, male   DOB: [birth year], 80 y.o.   MRN: [id number]     [facility]  Allergies  Allergen Reactions  . Dilaudid [Hydromorphone Hcl]     drowsy  . Methocarbamol     drowsy  . Percodan [Oxycodone-Aspirin]     confusion  . Tramadol     drowsy  . Vicodin [Hydrocodone-Acetaminophen]     confusion     Chief Complaint  Patient presents with  . Hospitalization Follow-up    HPI:  He has been hospitalized for a right hip revision. He is here for short term rehab with his goal to return home upon completion of his therapy. He is not complaining of pain at this time. I have spoken with his family; who would like to have his tylenol scheduled to manage his pain for therapy.  There are no concerns being voiced by the nursing staff at this time.    Past Medical History  Diagnosis Date  . Hyperlipidemia   . Hypertension   . Erectile dysfunction   . Prostate cancer 05/11/2011    bx=Adenocarcinoma,gleason3+4=7, 4+4=8,PSA=10.30volume=45.7cc  . History of asbestos exposure   . Nocturia   . History of shingles 2012-- BILATERAL EYES--  NO RESIDUAL  . Dermatitis, atopic ARMS AND LEGS  . OA (osteoarthritis) of hip RIGHT  . Arthritis   . Frequency of urination   . Smokers' cough   . Lumbar scoliosis   . History of radiation therapy 8/19-13-10/18/11    prostate, 49 GY  . Glaucoma   . DDD (degenerative disc disease)   . Diverticulosis   . Atherosclerosis   . Anemia   . Complication of anesthesia     CONFUSION - Pt's family very concerned about this    Past Surgical History  Procedure Laterality Date  . Esophageus cyst removal  YRS AGO  . Umbilical hernia repair  1998    epigastric  . Prostate biopsy  05/11/11    Adenocarcinoma (MD OFFICE)  . Cardiac catheterization  02-24-2009  DR NASHER    MINOR CORONARY ARTERY IRREGULARITIES/ NORMAL LVSF/ EF 65-70%  . Attempted left vats/ left thoracotomy/ resection of the enormous, probable bronchogenic cyst   01-04-2005  DR Arlyce Dice  . Repair right inguinal hernia w/ mesh  09-10-1999  . Lumbar spine surgery  1991  . Radioactive seed implant  11/15/2011    Procedure: RADIOACTIVE SEED IMPLANT;  Surgeon: Dutch Gray, MD;  Location: [facility];  Service: Urology;  Laterality: N/A;  Total number of seeds - 52  . Cystoscopy  11/15/2011    Procedure: CYSTOSCOPY;  Surgeon: Dutch Gray, MD;  Location: [facility];  Service: Urology;  Laterality: N/A;  no seeds seen in bladder  . Colonoscopy w/ polypectomy    . Total hip arthroplasty Right 03/20/2013    Procedure: RIGHT TOTAL HIP ARTHROPLASTY ANTERIOR APPROACH;  Surgeon: [name], MD;  Location: [facility];  Service: Orthopedics;  Laterality: Right;  . Total hip revision Right 05/14/2013    Procedure: open reduction internal fixation REVISION RIGHT HIP ;  Surgeon: [name], MD;  Location: [facility];  Service: Orthopedics;  Laterality: Right;    VITAL SIGNS BP 123/77  Pulse 61  Ht 6\' 3"  (1.905 m)  Wt 176 lb (79.833 kg)  BMI 22.00 kg/m2   Patient's Medications  New Prescriptions   No medications on file  Previous Medications   ACETAMINOPHEN (TYLENOL) 500 MG TABLET    Take 2 tablets (  1,000 mg total) by mouth every 8 (eight) hours as needed for mild pain.   ASPIRIN EC 325 MG EC TABLET    Take 1 tablet (325 mg total) by mouth 2 (two) times daily.   BICALUTAMIDE (CASODEX) 50 MG TABLET    Take 50 mg by mouth every morning.    BRIMONIDINE-TIMOLOL (COMBIGAN) 0.2-0.5 % OPHTHALMIC SOLUTION    Place 1 drop into both eyes every 12 (twelve) hours.   CALCIUM CARBONATE (TUMS - DOSED IN MG ELEMENTAL CALCIUM) 500 MG CHEWABLE TABLET    Chew 1 tablet by mouth daily.   CHOLECALCIFEROL (VITAMIN D) 1000 UNITS TABLET    Take 1,000 Units by mouth daily.   DOCUSATE SODIUM 100 MG CAPS    Take 100 mg by mouth 2 (two) times daily.   EZETIMIBE (ZETIA) 10 MG TABLET    Take 10 mg by mouth every morning.    FERROUS SULFATE 325 (65 FE) MG TABLET     Take 1 tablet (325 mg total) by mouth 3 (three) times daily after meals.   FOLIC ACID (FOLVITE) 1 MG TABLET    Take 1 mg by mouth daily.   POLYETHYLENE GLYCOL (MIRALAX / GLYCOLAX) PACKET    Take 17 g by mouth 2 (two) times daily.   PRAVASTATIN (PRAVACHOL) 80 MG TABLET    Take 80 mg by mouth every evening.    TAMSULOSIN (FLOMAX) 0.4 MG CAPS CAPSULE    Take 0.4 mg by mouth 2 (two) times daily.   TIZANIDINE (ZANAFLEX) 4 MG CAPSULE    Take 1 capsule (4 mg total) by mouth 3 (three) times daily as needed for muscle spasms.   TRAMADOL (ULTRAM) 50 MG TABLET    Take 1-2 tablets (50-100 mg total) by mouth every 6 (six) hours as needed for moderate pain or severe pain.   VALACYCLOVIR (VALTREX) 500 MG TABLET    Take 500 mg by mouth 2 (two) times daily.  Modified Medications   No medications on file  Discontinued Medications   No medications on file    SIGNIFICANT DIAGNOSTIC EXAMS  03-15-13: chest x-ray: No acute abnormalities.  03-20-13: pelvic x-ray Patient is status post total right hip arthroplasty.  03-20-13: right hip x-ray: Probable postoperative air within soft tissues overlying the lesser trochanter. A fracture is less likely. Routine views are recommended when possible.  04-08-13: chest x-ray: No acute abnormality noted.  04-08-13: ct of head: Mild atrophy with mild periventricular small vessel disease. No intracranial mass, hemorrhage, or acute appearing infarct. There ischronic right-sided mastoid disease. There is patchy paranasal sinus disease.  05-14-13: pelvic x-ray: There is a vertically oriented fracture involving the lateral aspect of the proximal right femur with approximately 3 cm of overriding between the fracture fragments.   05-14-13: right hip x-ray: Vertically oriented fracture of the proximal right femur transfixedby 3 cerclage wires with 14 mm of lateral displacement of the fracture fragment.       LABS REVIEWED  03-15-13: wbc 2.6 hgb 12.7; hct 38.1; mcv 832. plt 208;  glucose 74; bun 16; creat 1.15; k+4.2; na++ 140 03-22-13: wbc 7.6; hgb 10.0; hct 30.1; mcv 82.7;plt 178; glucose 123; bun 15; creat 1.13; k+3.7; na++140 03-27-13: wbc 5.1; hgb 8.7; hct 27.3; mcv 82.2; plt 291  04-02-13: wbc 5.3; hgb 8.8; hct 28.6 ;mcv 84.9; plt 472 05-07-13:wbc 3.6; hgb 11.1; hct 34.5; mcv 82.3; plt 270; glucose 78; bun 12; creat 1.29; k+4.2;na++141 05-16-13: wbc 6.9; hgb 6.7 hct 19.8; mcv 89.2; plt 155; glucose 113; bun  17; creat 1.24; k+4.3; na++139 05-17-13: wbc 6.0; hgb 8.4; hct 24.7; mcv 80.7; plt 153        Review of Systems  Constitutional: Negative for malaise/fatigue.  Respiratory: Negative for cough and shortness of breath.   Cardiovascular: Negative for chest pain, palpitations and leg swelling.  Gastrointestinal:negative for constipation. Negative for heartburn and abdominal pain.  Musculoskeletal: Negative for joint pain and myalgias.  Skin: Negative.   Neurological: Negative for dizziness and weakness.  Psychiatric/Behavioral: Negative for depression. The patient is not nervous/anxious.       Physical Exam  Constitutional: He is oriented to person, place, and time. He appears well-nourished. No distress.  thin  Neck: Neck supple. No JVD present.  Cardiovascular: Normal rate, regular rhythm and intact distal pulses.   Respiratory: Breath sounds normal. No respiratory distress. He has no wheezes.  GI: Soft. Bowel sounds are normal. He exhibits no distension. There is no tenderness.  Musculoskeletal: He exhibits no edema.  Is able to move all extremities; is status post right hip revision.  Neurological: He is alert and oriented to person, place, and time.  Skin: Skin is dry. He is not diaphoretic.   Psychiatric: He has a normal mood and affect.     ASSESSMENT/ PLAN:  1. Right hip osteoarthritis status post right hip revision: will continue therapy as directed; will continue ultram 50 or 100 mg every 6 hours as needed will continue zanaflex 4 mg three  times daily as needed for spasms; will change his tylenol to 1 gm three times daily as needed.   2. Anemia: will continue iron three times daily and iwll check cbc next week will monitor  3. Prostate cancer: will continue casodex 50 mg daily   4. Dyslipidemia: will continue zetia 10 mg daily and pravachol 80 mg daily   5. BPH: will continue flomax 0.4 mg daily   6. Constipation: will continue colace twice daily and miralax twice daily   7. History of shingles to eye: no recent outbreak; will continue valtrex 500 mg twice daily and will monitor   8. Glaucoma: will continue combigan to both eyes twice daily      Time spent with patient 50 minutes      Ok Edwards NP [facility]  Contact [phone] Monday through Friday 8am- 5pm  After hours call [phone]

## 2013-05-21 ENCOUNTER — Non-Acute Institutional Stay (SKILLED_NURSING_FACILITY): Payer: Medicare Other | Admitting: Internal Medicine

## 2013-05-21 DIAGNOSIS — K5909 Other constipation: Secondary | ICD-10-CM

## 2013-05-21 DIAGNOSIS — M169 Osteoarthritis of hip, unspecified: Secondary | ICD-10-CM

## 2013-05-21 DIAGNOSIS — D638 Anemia in other chronic diseases classified elsewhere: Secondary | ICD-10-CM

## 2013-05-21 DIAGNOSIS — H409 Unspecified glaucoma: Secondary | ICD-10-CM

## 2013-05-21 DIAGNOSIS — E785 Hyperlipidemia, unspecified: Secondary | ICD-10-CM

## 2013-05-21 DIAGNOSIS — M1611 Unilateral primary osteoarthritis, right hip: Secondary | ICD-10-CM

## 2013-05-21 DIAGNOSIS — C61 Malignant neoplasm of prostate: Secondary | ICD-10-CM

## 2013-05-21 DIAGNOSIS — M1610 Unilateral primary osteoarthritis, unspecified hip: Secondary | ICD-10-CM

## 2013-05-30 ENCOUNTER — Non-Acute Institutional Stay (SKILLED_NURSING_FACILITY): Payer: Medicare Other | Admitting: Adult Health

## 2013-05-30 DIAGNOSIS — M1610 Unilateral primary osteoarthritis, unspecified hip: Secondary | ICD-10-CM

## 2013-05-30 DIAGNOSIS — M169 Osteoarthritis of hip, unspecified: Secondary | ICD-10-CM

## 2013-05-30 DIAGNOSIS — Z96649 Presence of unspecified artificial hip joint: Secondary | ICD-10-CM

## 2013-05-30 DIAGNOSIS — Z8619 Personal history of other infectious and parasitic diseases: Secondary | ICD-10-CM

## 2013-05-30 DIAGNOSIS — D500 Iron deficiency anemia secondary to blood loss (chronic): Secondary | ICD-10-CM

## 2013-06-09 ENCOUNTER — Encounter: Payer: Self-pay | Admitting: Adult Health

## 2013-06-09 NOTE — Progress Notes (Signed)
Patient ID: Derek Carr, male   DOB: Jun 21, 1931, 80 y.o.   MRN: [id number]     [facility]  Allergies  Allergen Reactions  . Dilaudid [Hydromorphone Hcl]     drowsy  . Methocarbamol     drowsy  . Percodan [Oxycodone-Aspirin]     confusion  . Tramadol     drowsy  . Vicodin [Hydrocodone-Acetaminophen]     confusion     Chief Complaint  Patient presents with  . Discharge Note    HPI:  He is being discharged to home with home health for pt/ot/nursing/aid. He will not need dme; he has all necessary equipment at home. He will need prescriptions to be written. He had been hospitalized for a right hip revision.   Past Medical History  Diagnosis Date  . Hyperlipidemia   . Hypertension   . Erectile dysfunction   . Prostate cancer 05/11/2011    bx=Adenocarcinoma,gleason3+4=7, 4+4=8,PSA=10.30volume=45.7cc  . History of asbestos exposure   . Nocturia   . History of shingles 2012-- BILATERAL EYES--  NO RESIDUAL  . Dermatitis, atopic ARMS AND LEGS  . OA (osteoarthritis) of hip RIGHT  . Arthritis   . Frequency of urination   . Smokers' cough   . Lumbar scoliosis   . History of radiation therapy 8/19-13-10/18/11    prostate, 51 GY  . Glaucoma   . DDD (degenerative disc disease)   . Diverticulosis   . Atherosclerosis   . Anemia   . Complication of anesthesia     CONFUSION - Pt's family very concerned about this    Past Surgical History  Procedure Laterality Date  . Esophageus cyst removal  YRS AGO  . Umbilical hernia repair  1998    epigastric  . Prostate biopsy  05/11/11    Adenocarcinoma ([facility] OFFICE)  . Cardiac catheterization  02-24-2009  DR NASHER    MINOR CORONARY ARTERY IRREGULARITIES/ NORMAL LVSF/ EF 65-70%  . Attempted left vats/ left thoracotomy/ resection of the enormous, probable bronchogenic cyst  01-04-2005  DR Arlyce Dice  . Repair right inguinal hernia w/ mesh  09-10-1999  . Lumbar spine surgery  1991  . Radioactive seed implant  11/15/2011    Procedure:  RADIOACTIVE SEED IMPLANT;  Surgeon: Dutch Gray, MD;  Location: [facility];  Service: Urology;  Laterality: N/A;  Total number of seeds - 52  . Cystoscopy  11/15/2011    Procedure: CYSTOSCOPY;  Surgeon: Dutch Gray, MD;  Location: [facility];  Service: Urology;  Laterality: N/A;  no seeds seen in bladder  . Colonoscopy w/ polypectomy    . Total hip arthroplasty Right 03/20/2013    Procedure: RIGHT TOTAL HIP ARTHROPLASTY ANTERIOR APPROACH;  Surgeon: [name], MD;  Location: [facility];  Service: Orthopedics;  Laterality: Right;  . Total hip revision Right 05/14/2013    Procedure: open reduction internal fixation REVISION RIGHT HIP ;  Surgeon: [name], MD;  Location: [facility];  Service: Orthopedics;  Laterality: Right;    VITAL SIGNS BP 136/70  Pulse 76  Ht 6\' 3"  (1.905 m)  Wt 178 lb 9.6 oz (81.012 kg)  BMI 22.32 kg/m2   Patient's Medications  New Prescriptions   No medications on file  Previous Medications   ACETAMINOPHEN (TYLENOL) 500 MG TABLET    Take 2 tablets (1,000 mg total) by mouth every 8 (eight) hours.   ASPIRIN EC 325 MG EC TABLET    Take 1 tablet (325 mg total) by mouth 2 (two)  times daily.   BICALUTAMIDE (CASODEX) 50 MG TABLET    Take 50 mg by mouth every morning.    BRIMONIDINE-TIMOLOL (COMBIGAN) 0.2-0.5 % OPHTHALMIC SOLUTION    Place 1 drop into both eyes every 12 (twelve) hours.   CALCIUM CARBONATE (TUMS - DOSED IN MG ELEMENTAL CALCIUM) 500 MG CHEWABLE TABLET    Chew 1 tablet by mouth daily.   CHOLECALCIFEROL (VITAMIN D) 1000 UNITS TABLET    Take 1,000 Units by mouth daily.   DOCUSATE SODIUM 100 MG CAPS    Take 100 mg by mouth 2 (two) times daily.   EZETIMIBE (ZETIA) 10 MG TABLET    Take 10 mg by mouth every morning.    FERROUS SULFATE 325 (65 FE) MG TABLET    Take 1 tablet (325 mg total) by mouth 3 (three) times daily after meals.   FOLIC ACID (FOLVITE) 1 MG TABLET    Take 1 mg by mouth daily.   POLYETHYLENE GLYCOL (MIRALAX / GLYCOLAX)  PACKET    Take 17 g by mouth 2 (two) times daily.   PRAVASTATIN (PRAVACHOL) 80 MG TABLET    Take 80 mg by mouth every evening.    TAMSULOSIN (FLOMAX) 0.4 MG CAPS CAPSULE    Take 0.4 mg by mouth 2 (two) times daily.   TIZANIDINE (ZANAFLEX) 4 MG CAPSULE    Take 1 capsule (4 mg total) by mouth 3 (three) times daily as needed for muscle spasms.   TRAMADOL (ULTRAM) 50 MG TABLET    Take 1-2 tablets (50-100 mg total) by mouth every 6 (six) hours as needed for moderate pain or severe pain.   VALACYCLOVIR (VALTREX) 500 MG TABLET    Take 500 mg by mouth 2 (two) times daily.  Modified Medications   No medications on file  Discontinued Medications   No medications on file    SIGNIFICANT DIAGNOSTIC EXAMS   03-15-13: chest x-ray: No acute abnormalities.  03-20-13: pelvic x-ray Patient is status post total right hip arthroplasty.  03-20-13: right hip x-ray: Probable postoperative air within soft tissues overlying the lesser trochanter. A fracture is less likely. Routine views are recommended when possible.  04-08-13: chest x-ray: No acute abnormality noted.  04-08-13: ct of head: Mild atrophy with mild periventricular small vessel disease. No intracranial mass, hemorrhage, or acute appearing infarct. There ischronic right-sided mastoid disease. There is patchy paranasal sinus disease.  05-14-13: pelvic x-ray: There is a vertically oriented fracture involving the lateral aspect of the proximal right femur with approximately 3 cm of overriding between the fracture fragments.   05-14-13: right hip x-ray: Vertically oriented fracture of the proximal right femur transfixedby 3 cerclage wires with 14 mm of lateral displacement of the fracture fragment.       LABS REVIEWED  03-15-13: wbc 2.6 hgb 12.7; hct 38.1; mcv 832. plt 208; glucose 74; bun 16; creat 1.15; k+4.2; na++ 140 03-22-13: wbc 7.6; hgb 10.0; hct 30.1; mcv 82.7;plt 178; glucose 123; bun 15; creat 1.13; k+3.7; na++140 03-27-13: wbc 5.1; hgb 8.7; hct  27.3; mcv 82.2; plt 291  04-02-13: wbc 5.3; hgb 8.8; hct 28.6 ;mcv 84.9; plt 472 05-07-13:wbc 3.6; hgb 11.1; hct 34.5; mcv 82.3; plt 270; glucose 78; bun 12; creat 1.29; k+4.2;na++141 05-16-13: wbc 6.9; hgb 6.7 hct 19.8; mcv 89.2; plt 155; glucose 113; bun 17; creat 1.24; k+4.3; na++139 05-17-13: wbc 6.0; hgb 8.4; hct 24.7; mcv 80.7; plt 153   05-21-13: wbc 4.3; hgb 8.8; hct 28.9; mcv 85.3; plt 270  Review of Systems  Constitutional: Negative for malaise/fatigue.  Respiratory: Negative for cough and shortness of breath.   Cardiovascular: Negative for chest pain, palpitations and leg swelling.  Gastrointestinal:negative for constipation. Negative for heartburn and abdominal pain.  Musculoskeletal: Negative for joint pain and myalgias.  Skin: Negative.   Neurological: Negative for dizziness and weakness.  Psychiatric/Behavioral: Negative for depression. The patient is not nervous/anxious.       Physical Exam  Constitutional: He is oriented to person, place, and time. He appears well-nourished. No distress.  thin  Neck: Neck supple. No JVD present.  Cardiovascular: Normal rate, regular rhythm and intact distal pulses.   Respiratory: Breath sounds normal. No respiratory distress. He has no wheezes.  GI: Soft. Bowel sounds are normal. He exhibits no distension. There is no tenderness.  Musculoskeletal: He exhibits no edema.  Is able to move all extremities; is status post right hip revision.  Neurological: He is alert and oriented to person, place, and time.  Skin: Skin is dry. He is not diaphoretic.   Psychiatric: He has a normal mood and affect.     ASSESSMENT/ PLAN:  Will discharge to home with home health for pt/ot/nursing/aid. Will not need dme. His prescriptions have been written.    Time spent with patient 40 minutes.     Ok Edwards NP [facility]  Contact [phone] Monday through Friday 8am- 5pm  After hours call [phone]

## 2013-06-25 ENCOUNTER — Ambulatory Visit (INDEPENDENT_AMBULATORY_CARE_PROVIDER_SITE_OTHER): Payer: Medicare Other | Admitting: Internal Medicine

## 2013-06-25 ENCOUNTER — Encounter: Payer: Self-pay | Admitting: Internal Medicine

## 2013-06-25 VITALS — BP 114/64 | HR 82 | Temp 98.0°F | Ht 72.0 in | Wt 182.0 lb

## 2013-06-25 DIAGNOSIS — J449 Chronic obstructive pulmonary disease, unspecified: Secondary | ICD-10-CM

## 2013-06-25 DIAGNOSIS — R55 Syncope and collapse: Secondary | ICD-10-CM

## 2013-06-25 NOTE — Patient Instructions (Addendum)
I would not start the flomax back if you don't think it helped you urinate  Check blood pressure sitting and standing daily and let Dr Joylene Draft know if it's dropping more than 20 points systolic   You have GOLD II COPD Mild/moderate severity and will never progress unless you resume smoking so stick with it!  Pulmonary follow up is as needed

## 2013-06-25 NOTE — Progress Notes (Addendum)
Subjective:     Patient ID: Derek Carr, male   DOB: [birth year]   MRN: [id number]  HPI  36 yobm quit smoking around 04/2013 and not limited by sob at that point with having spells of hypersomnolence since orthopedic surgery referred by Dr Joylene Draft to pulmonary clinic 06/25/2013  for ? Hypercarbia contributing    06/25/2013 1st [facility] office visit/ Wert  3 spells x 2 months lasting 5 min at most then resolve s sequelae assoc with low bp per [name] daugher/CCU nurse - better when lie down and get both legs up so stopped flomax since last episode one week prior to OV  And no spells since stopped the flomax (which he says didn't help his urination anyway)  - occur sitting up assoc with drowsiness first  - never needs saba to break a spell or aware of sob before or after a spell.  Not limited by breathing from desired activities  No narcotic use/ exposure to benzo's  No obvious day to day or daytime variabilty or assoc chronic cough or cp or chest tightness, subjective wheeze overt sinus or hb symptoms. No unusual exp hx or h/o childhood pna/ asthma or knowledge of premature birth.  Sleeping ok without nocturnal  or early am exacerbation  of respiratory  c/o's or need for noct saba. Wakes up feeling refreshed and denies hypersomnolence.  Also denies any obvious fluctuation of symptoms with weather or environmental changes or other aggravating or alleviating factors except as outlined above   Current Medications, Allergies, Complete Past Medical History, Past Surgical History, Family History, and Social History were reviewed in [employer] record.  ROS  The following are not active complaints unless bolded sore throat, dysphagia, dental problems, itching, sneezing,  nasal congestion or excess/ purulent secretions, ear ache,   fever, chills, sweats, unintended wt loss, pleuritic or exertional cp, hemoptysis,  orthopnea pnd or leg swelling, presyncope, palpitations, heartburn,  abdominal pain, anorexia, nausea, vomiting, diarrhea  or change in bowel or urinary habits, change in stools or urine, dysuria,hematuria,  rash, arthralgias, visual complaints, headache, numbness weakness or ataxia or problems with walking or coordination,  change in mood/affect or memory.        Review of Systems     Objective:   Physical Exam Stoic bm nad - lets his daughter [name] do most of the talking   Wt Readings from Last 3 Encounters:  06/25/13 182 lb (82.555 kg)  05/30/13 178 lb 9.6 oz (81.012 kg)  05/18/13 176 lb (79.833 kg)      HEENT: top dentures, nl turbinates, and orophanx. Nl external ear canals without cough reflex   NECK :  without JVD/Nodes/TM/ nl carotid upstrokes bilaterally   LUNGS: no acc muscle use, clear to A and P bilaterally without cough on insp or exp maneuvers   CV:  RRR  no s3 or murmur or increase in P2, no edema   ABD:  soft and nontender with nl excursion in the supine position. No bruits or organomegaly, bowel sounds nl  MS:  warm without deformities, calf tenderness, cyanosis or clubbing  SKIN: warm and dry without lesions    NEURO:  alert, approp, no deficits    cxr 04/08/13 FINDINGS:  Postsurgical changes are again noted on the left. The lungs are  clear. The cardiac shadow is stable. No bony abnormality is seen.  IMPRESSION:  No acute abnormality noted.  Lab Results  Component Value Date   HGB 8.4* 05/17/2013  HC03 24 05/16/13  .   Assessment:

## 2013-06-26 DIAGNOSIS — R55 Syncope and collapse: Secondary | ICD-10-CM | POA: Insufficient documentation

## 2013-06-26 NOTE — Assessment & Plan Note (Signed)
-   spirometry 06/25/13  FEV1  2.17 (74%) ratio 67   As I explained to this patient and daughter in detail:  although there may be mild copd present, it is probably not   clinically relevant:   it does not appear to be limiting activity tolerance any more than a set of worn tires limits someone from driving a car  around a parking lot.  A new set of Michelins might look good but would have no perceived impact on the performance of the car and would not be worth the cost.  That is to say:  I don't rec any pulmonary rx at this point unless limiting symptoms arise or acute exacerbations become as issue, neither of which is the case now.  I asked the patient to contact this office at any time in the future should either of these problems arise.    His hc03 is nl, he is not obese, so I doubt since osa and clearly this is not chronic hypercarbia/ohs.   Pulmonary f/u can be prn

## 2013-06-26 NOTE — Assessment & Plan Note (Signed)
Improved off flomax  His daughter [name] is a CCU nurse who took him off flomax on her own and he has had no spells since.  Since all spells have occurred sitting, never while lying down, rec maintain off flomax and start checking orthostatic bp's to r/o orthostasis as a concern but clearly this is not related to his mild copd or hypercarbia as discussed above.

## 2013-07-02 ENCOUNTER — Other Ambulatory Visit: Payer: Self-pay | Admitting: *Deleted

## 2013-07-02 DIAGNOSIS — R42 Dizziness and giddiness: Secondary | ICD-10-CM

## 2013-07-09 ENCOUNTER — Other Ambulatory Visit: Payer: Self-pay | Admitting: *Deleted

## 2013-07-09 ENCOUNTER — Encounter: Payer: Self-pay | Admitting: Radiology

## 2013-07-09 ENCOUNTER — Encounter (INDEPENDENT_AMBULATORY_CARE_PROVIDER_SITE_OTHER): Payer: Medicare Other

## 2013-07-09 DIAGNOSIS — R42 Dizziness and giddiness: Secondary | ICD-10-CM

## 2013-07-09 NOTE — Progress Notes (Signed)
Patient ID: Derek Carr, male   DOB: 12-20-1931, 80 y.o.   MRN: [id number] [employer] 30 day monitor applied

## 2013-07-14 ENCOUNTER — Other Ambulatory Visit: Payer: Self-pay | Admitting: Adult Health

## 2013-07-16 ENCOUNTER — Encounter: Payer: Self-pay | Admitting: *Deleted

## 2013-07-17 ENCOUNTER — Ambulatory Visit (INDEPENDENT_AMBULATORY_CARE_PROVIDER_SITE_OTHER): Payer: Medicare Other | Admitting: Neurology

## 2013-07-17 ENCOUNTER — Encounter: Payer: Self-pay | Admitting: Neurology

## 2013-07-17 VITALS — BP 113/73 | HR 80 | Resp 17 | Ht 73.0 in | Wt 175.0 lb

## 2013-07-17 DIAGNOSIS — J441 Chronic obstructive pulmonary disease with (acute) exacerbation: Secondary | ICD-10-CM

## 2013-07-17 DIAGNOSIS — F05 Delirium due to known physiological condition: Secondary | ICD-10-CM

## 2013-07-17 DIAGNOSIS — R55 Syncope and collapse: Secondary | ICD-10-CM

## 2013-07-17 DIAGNOSIS — R443 Hallucinations, unspecified: Secondary | ICD-10-CM

## 2013-07-17 DIAGNOSIS — R410 Disorientation, unspecified: Secondary | ICD-10-CM

## 2013-07-17 NOTE — Progress Notes (Signed)
[facility]  Provider:  [name], M D  Referring Provider: Jerlyn Ly, MD Primary Care Physician:  Jerlyn Ly, MD  Chief Complaint  Patient presents with  . New Evaluation    Room 11  . Sleep consult    HPI:  Derek Carr is a 80 y.o. male  Is seen here as a revisit  from Dr. Joylene Draft for a new problem, possible sleep apnea. He was in the past seen for  dizziness.  Dr. Joylene Draft re-evalauted this patient's nocturnal pulse and oxygenation in an overnight pulse oximetry.  The test was proceeded by 3 episodes of extreme sleepiness, lethargy and limited responsiveness. His daughter felt this may represent a seizure. She took his heart rate, which was in the sixties and his BP was low. She elevated his feet, which helped to overcome the symptoms. He is sleepy a lot, she reports. He still has nocturia ,5-6 times each night. The pulse oximetry did not sure the patient does suffer from prolonged hypoxia, but his pulses are variable [place] multiple periods of oxygen desaturation for short or brief periods of time. Dr. Joylene Draft felt that this warrants an evaluation for possible obstructive sleep apnea. Dr. Arrie Eastern evaluated the patient's pulmonary function and found +2 with COPD and several small lung nodules.  Dr. Jana Hakim felt these were not big enough to biopsy, unlikely cancerous, and should be watched.  This patients dizziness resolved after hip surgery- indicating that there was some instability of gait as well as orthostatic problems. He has had the  3 spells as above described since he underwent the 2 hip surgeries.      Last visit note 2014  :  Derek Carr is here today after a 6 month history of balance and orthostasis  problems. He has been followed by Dr. Redmond Baseman and Dr. Joylene Draft,  he also urologist Dr. Raynelle Bring.The patient underwent an ear nose and throat evaluation on 09/26/2012, a head CT had revealed some right ear mass / cyst, and his presenting   dizziness symptoms were something more "like imbalance" and not much vertigo by description. The ENT note related findings and recommended proceeding with vestibular rehabilitation and a neurologic evaluation.  The patient started with dizziness symptoms in April  2014 he fell early twice, he fell that he "staggered" when he first got up from a seated position and he felt also that when he was leaning forward he seemed to have chased his point of gravity. He had propulsive falls.   There was no spinning sensation - he  was not bothered when he was standing up -without movement. He also had not felt any trouble when lying down or lying on the left or right side. The imbalance was  always to be postural and triggered by movements. . Dr. Joylene Draft, his internal medicine specialist,  changed some of his blood pressure medications,  hoping that this would alleviate the symptoms. Amlodipine was the first medication to be reduced and also finally weaned off ,  it did not lead to a greater stability for the patient. VESIcare was investigated and started at 10 mg and replaced Flomax.  Lisbeth Ply was stopped after that. This happened about 2 months ago meanwhile over the last 8 weeks Derek Carr balance and orthostatic dizziness has improved.  He is back on VESIcare and still is tolerating this medicine along pretty well.  His daughter , a Therapist, sports,  reports that her father , who used vesicare and other meds to  control urination, is now up every hour with nocturia.  He is a slender man , he has prostrate cancer and had radiation therapy by beam and seeds, and Lupron injections.  He get's less sleep. He has hot flushes and he is bothered by those.  He is neither fatigued nor in pain, but naps many times in daytime, his nocturnal sleep no longer being refreshing.  Sleep time at 10 PM and he will fall asleep promptly, he will rise at 8 AM, but only 6-6.5 hours are truly asleep.   Review of Systems: Out of a complete 14  system review, the patient complains of only the following symptoms, and all other reviewed systems are negative. No shortness of breath, walks with cane, he doesn't snore according to wife and daughter. 3 spells of sudden weakness, lethargy  and slurred speech.    History   Social History  . Marital Status: Married    Spouse Name: [name]    Number of Children: 4  . Years of Education: College   Occupational History  .    [employer]    Retired   Social History Main Topics  . Smoking status: Former Smoker -- 1.00 packs/day for 40 years    Types: Cigarettes    Quit date: 03/15/2012  . Smokeless tobacco: Never Used  . Alcohol Use: No  . Drug Use: No     Comment: quit smoking 02/2012  . Sexual Activity: Not on file   Other Topics Concern  . Not on file   Social History Narrative   Patient is Married Designer, television/film set), and lives at home with his wife 58 years   Patient has 3 daughters and 1 son.   Patient has a college education.   Patient is right-handed.   Patient drinks one cup of coffee, 1-2 cups of tea daily and rarely drinks soda.   Retired from [employer] History  Problem Relation Age of Onset  . Hypertension Mother   . Prostate cancer Brother     surgery/cured  . Lung cancer Brother     new primary/same brother    Past Medical History  Diagnosis Date  . Hyperlipidemia   . Hypertension   . Erectile dysfunction   . Prostate cancer 05/11/2011    bx=Adenocarcinoma,gleason3+4=7, 4+4=8,PSA=10.30volume=45.7cc  . History of asbestos exposure   . Nocturia   . History of shingles 2012-- BILATERAL EYES--  NO RESIDUAL  . Dermatitis, atopic ARMS AND LEGS  . OA (osteoarthritis) of hip RIGHT  . Degenerative arthritis   . Frequency of urination   . Smokers' cough   . Lumbar scoliosis   . History of radiation therapy 8/19-13-10/18/11    prostate, 40 GY  . Glaucoma   . DDD (degenerative disc disease)   . Diverticulosis   . Atherosclerosis   . Anemia   .  Complication of anesthesia     CONFUSION - Pt's family very concerned about this  . Inguinal hernia   . Renal cyst     bilateral    Past Surgical History  Procedure Laterality Date  . Esophageus cyst removal  YRS AGO  . Umbilical hernia repair  1998    epigastric  . Prostate biopsy  05/11/11    Adenocarcinoma ([facility] OFFICE)  . Cardiac catheterization  02-24-2009  DR NASHER    MINOR CORONARY ARTERY IRREGULARITIES/ NORMAL LVSF/ EF 65-70%  . Attempted left vats/ left thoracotomy/ resection of the enormous, probable bronchogenic cyst  01-04-2005  DR Arlyce Dice  .  Repair right inguinal hernia w/ mesh  09-10-1999  . Lumbar spine surgery  1991  . Radioactive seed implant  11/15/2011    Procedure: RADIOACTIVE SEED IMPLANT;  Surgeon: Dutch Gray, MD;  Location: [facility];  Service: Urology;  Laterality: N/A;  Total number of seeds - 52  . Cystoscopy  11/15/2011    Procedure: CYSTOSCOPY;  Surgeon: Dutch Gray, MD;  Location: [facility];  Service: Urology;  Laterality: N/A;  no seeds seen in bladder  . Colonoscopy w/ polypectomy    . Total hip arthroplasty Right 03/20/2013    Procedure: RIGHT TOTAL HIP ARTHROPLASTY ANTERIOR APPROACH;  Surgeon: [name], MD;  Location: [facility];  Service: Orthopedics;  Laterality: Right;  . Total hip revision Right 05/14/2013    Procedure: open reduction internal fixation REVISION RIGHT HIP ;  Surgeon: [name], MD;  Location: [facility];  Service: Orthopedics;  Laterality: Right;    Current Outpatient Prescriptions  Medication Sig Dispense Refill  . acetaminophen (TYLENOL) 500 MG tablet Take 2 tablets (1,000 mg total) by mouth every 8 (eight) hours.  30 tablet  0  . aspirin 81 MG tablet Take 81 mg by mouth daily.      . bicalutamide (CASODEX) 50 MG tablet Take 50 mg by mouth every morning.       . brimonidine-timolol (COMBIGAN) 0.2-0.5 % ophthalmic solution Place 1 drop into both eyes every 12 (twelve) hours.      . calcium  carbonate (TUMS - DOSED IN MG ELEMENTAL CALCIUM) 500 MG chewable tablet Chew 1 tablet by mouth daily.      . cholecalciferol (VITAMIN D) 1000 UNITS tablet Take 1,000 Units by mouth daily.      . Coenzyme Q10 (CO Q 10) 100 MG CAPS Take 200 mg by mouth daily.      . Docosahexaenoic Acid (DHA PO) Take 1 capsule by mouth daily.      Marland Kitchen ezetimibe (ZETIA) 10 MG tablet Take 10 mg by mouth every morning.       . folic acid (FOLVITE) 1 MG tablet Take 1 mg by mouth daily.      . meloxicam (MOBIC) 15 MG tablet 1 tablet daily.      Marland Kitchen MYRBETRIQ 50 MG TB24 tablet 1 tablet daily.      . polyethylene glycol (MIRALAX / GLYCOLAX) packet Take 17 g by mouth daily as needed.      . pravastatin (PRAVACHOL) 80 MG tablet Take 80 mg by mouth every evening.       . valACYclovir (VALTREX) 500 MG tablet Take 500 mg by mouth daily.        No current facility-administered medications for this visit.    Allergies as of 07/17/2013 - Review Complete 07/17/2013  Allergen Reaction Noted  . Dilaudid [hydromorphone hcl]  05/07/2013  . Methocarbamol  05/07/2013  . Percodan [oxycodone-aspirin]  05/07/2013  . Tramadol  05/07/2013  . Vicodin [hydrocodone-acetaminophen]  05/07/2013    Vitals: BP 113/73  Pulse 80  Resp 17  Ht 6\' 1"  (1.854 m)  Wt 175 lb (79.379 kg)  BMI 23.09 kg/m2 Last Weight:  Wt Readings from Last 1 Encounters:  07/17/13 175 lb (79.379 kg)   Last Height:   Ht Readings from Last 1 Encounters:  07/17/13 6\' 1"  (1.854 m)    Physical exam:  General: The patient is awake, alert and appears not in acute distress. The patient is well groomed. Head: Normocephalic, atraumatic. Neck is supple. Mallampati 2 ,  neck circumference:16 inches.    Cardiovascular:  Regular rate and rhythm , without  murmurs , i found a milder left over right  carotid bruit,  No distended neck veins. Respiratory: Lungs are clear to auscultation. Skin:  Without evidence of edema, or rash Trunk: BMI is normal.  Neurologic exam  : The patient is awake and alert, oriented to place and time.   Memory subjective described as intact. There is a normal attention span & concentration ability.  We performed a MMSE and this patient had significant deficits in recall, calculation and scored 21/30 points.  Speech is fluent without  dysarthria, dysphonia or aphasia.  Mood and affect are appropriate.  Cranial nerves: Pupils are equal and briskly reactive to light. Funduscopic exam without  evidence of pallor or edema. Extraocular movements  in vertical and horizontal planes intact and without nystagmus.  Visual fields by finger perimetry are intact. Hearing to finger rub intact.  Facial sensation intact to fine touch. Facial motor strength is symmetric and tongue and uvula move midline.  Motor exam:    Normal tone and normal muscle bulk and symmetric strength in all extremities. He has used a walker until recently , now walks with a cane. He is in PT.  Sensory:  Fine touch, pinprick and vibration were tested in all extremities. Proprioception is normal.  Coordination: Rapid alternating movements in the fingers/hands is tested and normal. Finger-to-nose maneuver  normal without evidence of ataxia,or tremor.  Gait and station: Patient walks with a cane - assistive device  Stance is stable and normal. Romberg testing showed swaying , but he is not falling.   Deep tendon reflexes: in the  upper and lower extremities are symmetric and intact. Babinski maneuver response is downgoing.   Assessment:  After physical and neurologic examination, review of laboratory studies, imaging, neurophysiology testing and pre-existing records, assessment is   1) 3 spells of sudden lethargy, weakness delayed verbal response time. TIA or seizure? He was confused after each anesthesia with the recent surgeries. Early dementia , delirium.  2) nocturia , patient feels there is no underlying OSA, no snoring. No witnessed apnea. Borderline desaturation on  the June 2015  ONO study with Dr. Joylene Draft. Will perform a overnight PSG with full EEG. . 3) no acute cerebrovascular abnormalities. Imaging reviewed, he wore a 30 day monitor without irregularities.  Carotid doppler normal for age.  Plan:  Treatment plan and additional workup :  PSG with full EEG , revisit with  MMSE.

## 2013-07-19 ENCOUNTER — Other Ambulatory Visit: Payer: Self-pay | Admitting: Internal Medicine

## 2013-07-19 DIAGNOSIS — R42 Dizziness and giddiness: Secondary | ICD-10-CM

## 2013-07-19 DIAGNOSIS — J984 Other disorders of lung: Secondary | ICD-10-CM

## 2013-07-19 DIAGNOSIS — R5383 Other fatigue: Principal | ICD-10-CM

## 2013-07-19 DIAGNOSIS — I951 Orthostatic hypotension: Secondary | ICD-10-CM

## 2013-07-19 DIAGNOSIS — R5381 Other malaise: Secondary | ICD-10-CM

## 2013-07-26 ENCOUNTER — Other Ambulatory Visit: Payer: Self-pay | Admitting: Internal Medicine

## 2013-07-26 DIAGNOSIS — N281 Cyst of kidney, acquired: Secondary | ICD-10-CM

## 2013-07-29 ENCOUNTER — Other Ambulatory Visit: Payer: Self-pay | Admitting: Adult Health

## 2013-08-02 ENCOUNTER — Institutional Professional Consult (permissible substitution): Payer: Medicare Other | Admitting: Internal Medicine

## 2013-08-06 ENCOUNTER — Encounter: Payer: Self-pay | Admitting: *Deleted

## 2013-08-06 NOTE — Progress Notes (Unsigned)
Patient ID: Derek Carr, male   DOB: Apr 11, 1931, 80 y.o.   MRN: [id number] Patient returned [place] monitor to our office instead of mailing to [facility] as intructed.  Monitor packaged and sent to [facility] 08/06/2013.

## 2013-08-11 NOTE — Progress Notes (Signed)
Patient ID: Derek Carr, male   DOB: [birth year], 80 y.o.   MRN: [id number]    Facility: [facility]   Chief Complaint  Patient presents with  . Hospitalization Follow-up    new admit   Allergies  Allergen Reactions  . Dilaudid [Hydromorphone Hcl]     confusion  . Methocarbamol     drowsy  . Percodan [Oxycodone-Aspirin]     confusion  . Tramadol     drowsy  . Vicodin [Hydrocodone-Acetaminophen]     confusion   HPI 80 y/o male patient is here for STR after hospital admission from 05/14/13-05/17/13 with right hip pain. He underwent right hip revision and is here working with therapy team. His pain is under control with current regimen. He has no concerns this visit. No new concern from staff.  Review of Systems  Constitutional: Negative for malaise/fatigue.  Respiratory: Negative for cough and shortness of breath.   Cardiovascular: Negative for chest pain, palpitations and leg swelling.  Gastrointestinal: Negative for heartburn, constipation and abdominal pain.  Musculoskeletal: Negative for joint pain and myalgias.  Skin: Negative.   Neurological: Negative for dizziness and weakness.  Psychiatric/Behavioral: Negative for depression. The patient is not nervous/anxious.    Past Medical History  Diagnosis Date  . Hyperlipidemia   . Hypertension   . Erectile dysfunction   . Prostate cancer 05/11/2011    bx=Adenocarcinoma,gleason3+4=7, 4+4=8,PSA=10.30volume=45.7cc  . History of asbestos exposure   . Nocturia   . History of shingles 2012-- BILATERAL EYES--  NO RESIDUAL  . Dermatitis, atopic ARMS AND LEGS  . OA (osteoarthritis) of hip RIGHT  . Degenerative arthritis   . Frequency of urination   . Smokers' cough   . Lumbar scoliosis   . History of radiation therapy 8/19-13-10/18/11    prostate, 25 GY  . Glaucoma   . DDD (degenerative disc disease)   . Diverticulosis   . Atherosclerosis   . Anemia   . Complication of anesthesia     CONFUSION  - Pt's family very concerned about this  . Inguinal hernia   . Renal cyst     bilateral   Medication reviewed. See [facility] Past Surgical History  Procedure Laterality Date  . Esophageus cyst removal  YRS AGO  . Umbilical hernia repair  1998    epigastric  . Prostate biopsy  05/11/11    Adenocarcinoma (MD OFFICE)  . Cardiac catheterization  02-24-2009  DR NASHER    MINOR CORONARY ARTERY IRREGULARITIES/ NORMAL LVSF/ EF 65-70%  . Attempted left vats/ left thoracotomy/ resection of the enormous, probable bronchogenic cyst  01-04-2005  DR Arlyce Dice  . Repair right inguinal hernia w/ mesh  09-10-1999  . Lumbar spine surgery  1991  . Radioactive seed implant  11/15/2011    Procedure: RADIOACTIVE SEED IMPLANT;  Surgeon: Dutch Gray, MD;  Location: [facility];  Service: Urology;  Laterality: N/A;  Total number of seeds - 52  . Cystoscopy  11/15/2011    Procedure: CYSTOSCOPY;  Surgeon: Dutch Gray, MD;  Location: [facility];  Service: Urology;  Laterality: N/A;  no seeds seen in bladder  . Colonoscopy w/ polypectomy    . Total hip arthroplasty Right 03/20/2013    Procedure: RIGHT TOTAL HIP ARTHROPLASTY ANTERIOR APPROACH;  Surgeon: [name], MD;  Location: [facility];  Service: Orthopedics;  Laterality: Right;  . Total hip revision Right 05/14/2013    Procedure: open reduction internal fixation REVISION RIGHT HIP ;  Surgeon: Rodman Key  Marian Sorrow, MD;  Location: [facility];  Service: Orthopedics;  Laterality: Right;   History   Social History  . Marital Status: Married    Spouse Name: [name]    Number of Children: 4  . Years of Education: College   Occupational History  .    [employer]    Retired   Social History Main Topics  . Smoking status: Former Smoker -- 1.00 packs/day for 40 years    Types: Cigarettes    Quit date: 03/15/2012  . Smokeless tobacco: Never Used  . Alcohol Use: No  . Drug Use: No     Comment: quit smoking 02/2012  . Sexual Activity: Not on file    Other Topics Concern  . Not on file   Social History Narrative   Patient is Married Designer, television/film set), and lives at home with his wife 55 years   Patient has 3 daughters and 1 son.   Patient has a college education.   Patient is right-handed.   Patient drinks one cup of coffee, 1-2 cups of tea daily and rarely drinks soda.   Retired from [employer] History  Problem Relation Age of Onset  . Hypertension Mother   . Prostate cancer Brother     surgery/cured  . Lung cancer Brother     new primary/same brother   Physical exam BP 138/76  Pulse 88  Temp(Src) 98 F (36.7 C)  Resp 18  SpO2 96%  Constitutional: thin built, no acute distress Neck: Neck supple. No JVD present.  Cardiovascular: Normal rate, regular rhythm and intact distal pulses.   Respiratory: Breath sounds normal. No respiratory distress. He has no wheezes.  GI: Soft. Bowel sounds are normal. He exhibits no distension. There is no tenderness.  Musculoskeletal: able to move all 4, s/p right hip revision, incision site healing well, dressing clean, no edema   Neurological: He is alert and oriented to person, place, and time.  Skin: Skin is dry. He is not diaphoretic.   Psychiatric: He has a normal mood and affect.   Labs 03-15-13: wbc 2.6 hgb 12.7; hct 38.1; mcv 832. plt 208; glucose 74; bun 16; creat 1.15; k+4.2; na++ 140 03-22-13: wbc 7.6; hgb 10.0; hct 30.1; mcv 82.7;plt 178; glucose 123; bun 15; creat 1.13; k+3.7; na++140 03-27-13: wbc 5.1; hgb 8.7; hct 27.3; mcv 82.2; plt 291   04-02-13: wbc 5.3; hgb 8.8; hct 28.6 ;mcv 84.9; plt 472 05-07-13:wbc 3.6; hgb 11.1; hct 34.5; mcv 82.3; plt 270; glucose 78; bun 12; creat 1.29; k+4.2;na++141 05-16-13: wbc 6.9; hgb 6.7 hct 19.8; mcv 89.2; plt 155; glucose 113; bun 17; creat 1.24; k+4.3; na++139 05-17-13: wbc 6.0; hgb 8.4; hct 24.7; mcv 80.7; plt 153     Imaging 03-15-13: chest x-ray: No acute abnormalities.  03-20-13: pelvic x-ray Patient is status post total right hip  arthroplasty.  03-20-13: right hip x-ray: Probable postoperative air within soft tissues overlying the lesser trochanter. A fracture is less likely. Routine views are recommended when possible.  04-08-13: chest x-ray: No acute abnormality noted.  04-08-13: ct of head: Mild atrophy with mild periventricular small vessel disease. No intracranial mass, hemorrhage, or acute appearing infarct. There ischronic right-sided mastoid disease. There is patchy paranasal sinus disease.  05-14-13: pelvic x-ray: There is a vertically oriented fracture involving the lateral aspect of the proximal right femur with approximately 3 cm of overriding between the fracture fragments.   05-14-13: right hip x-ray: Vertically oriented fracture of the proximal right femur transfixedby 3  cerclage wires with 14 mm of lateral displacement of the fracture fragment.   Assessment/plan  Right hip osteoarthritis status post right hip revision. Will have patient work with PT/OT as tolerated to regain strength and restore function.  Fall precautions are in place. continue tylenol prn for pain and zanaflex for muscle spasm. Has f/u with orthopedics. Continue aspirin with ca-vit d  Anemia continue iron three times daily and check cbc  Prostate cancer continue casodex 50 mg daily and flomax 0.4 mg daily   Hyperlipidemia continue pravachol 80 mg daily with zetia 10 mg daily  Constipation continue colace twice daily and miralax twice daily   Glaucoma continue Richarda Blade, MD  [facility] [phone] (Monday-Friday 8 am - 5 pm) [phone] (afterhours)

## 2013-08-12 NOTE — Progress Notes (Signed)
Patient ID: Derek Carr, male   DOB: [birth year], 80 y.o.   MRN: [id number]  Code status- full code  With persisting constipation, will stop colace for now. Add senna-s 2 tab daily and continue miralax.  He is 50% weight bearing at present

## 2013-08-14 ENCOUNTER — Telehealth: Payer: Self-pay | Admitting: Cardiology

## 2013-08-14 NOTE — Telephone Encounter (Signed)
Dr. Thompson Caul Interpretation of 30 day Heart Monitor: Normal Study. I will have results faxed to Dr. Joylene Draft who ordered monitor. Dr. Silvestre Mesi office will inform patient of results.

## 2013-08-26 ENCOUNTER — Emergency Department (HOSPITAL_COMMUNITY)
Admission: EM | Admit: 2013-08-26 | Discharge: 2013-08-26 | Disposition: A | Discharge status: Home / Self Care | Payer: Medicare Other | Attending: Emergency Medicine | Admitting: Emergency Medicine

## 2013-08-26 ENCOUNTER — Encounter (HOSPITAL_COMMUNITY): Payer: Self-pay | Admitting: Emergency Medicine

## 2013-08-26 DIAGNOSIS — Z8546 Personal history of malignant neoplasm of prostate: Secondary | ICD-10-CM | POA: Diagnosis not present

## 2013-08-26 DIAGNOSIS — R21 Rash and other nonspecific skin eruption: Secondary | ICD-10-CM | POA: Diagnosis present

## 2013-08-26 DIAGNOSIS — E785 Hyperlipidemia, unspecified: Secondary | ICD-10-CM | POA: Diagnosis not present

## 2013-08-26 DIAGNOSIS — Z87891 Personal history of nicotine dependence: Secondary | ICD-10-CM | POA: Insufficient documentation

## 2013-08-26 DIAGNOSIS — Z79899 Other long term (current) drug therapy: Secondary | ICD-10-CM | POA: Diagnosis not present

## 2013-08-26 DIAGNOSIS — Z87448 Personal history of other diseases of urinary system: Secondary | ICD-10-CM | POA: Insufficient documentation

## 2013-08-26 DIAGNOSIS — I10 Essential (primary) hypertension: Secondary | ICD-10-CM | POA: Diagnosis not present

## 2013-08-26 DIAGNOSIS — L309 Dermatitis, unspecified: Secondary | ICD-10-CM

## 2013-08-26 DIAGNOSIS — D649 Anemia, unspecified: Secondary | ICD-10-CM | POA: Diagnosis not present

## 2013-08-26 DIAGNOSIS — M169 Osteoarthritis of hip, unspecified: Secondary | ICD-10-CM | POA: Insufficient documentation

## 2013-08-26 DIAGNOSIS — Z923 Personal history of irradiation: Secondary | ICD-10-CM | POA: Diagnosis not present

## 2013-08-26 DIAGNOSIS — H409 Unspecified glaucoma: Secondary | ICD-10-CM | POA: Diagnosis not present

## 2013-08-26 DIAGNOSIS — L259 Unspecified contact dermatitis, unspecified cause: Secondary | ICD-10-CM | POA: Diagnosis not present

## 2013-08-26 DIAGNOSIS — M1610 Unilateral primary osteoarthritis, unspecified hip: Secondary | ICD-10-CM | POA: Insufficient documentation

## 2013-08-26 DIAGNOSIS — Z8719 Personal history of other diseases of the digestive system: Secondary | ICD-10-CM | POA: Diagnosis not present

## 2013-08-26 DIAGNOSIS — Z7982 Long term (current) use of aspirin: Secondary | ICD-10-CM | POA: Insufficient documentation

## 2013-08-26 DIAGNOSIS — Z9889 Other specified postprocedural states: Secondary | ICD-10-CM | POA: Diagnosis not present

## 2013-08-26 DIAGNOSIS — Z791 Long term (current) use of non-steroidal anti-inflammatories (NSAID): Secondary | ICD-10-CM | POA: Diagnosis not present

## 2013-08-26 MED ORDER — CLOBETASOL PROPIONATE 0.05 % EX CREA: 1.0000 "application " | TOPICAL_CREAM | Freq: Two times a day (BID) | CUTANEOUS | Status: DC

## 2013-08-26 MED ORDER — PREDNISONE 10 MG PO TABS: ORAL_TABLET | ORAL | Status: DC

## 2013-08-26 NOTE — ED Notes (Signed)
Pt c/o rash on arms, chest, face, and neck x2 weeks, hx of eczema.  Pt as last seen by dermatologist x2 years has an appt scheduled for 09/04/13, but reported that rash started spreading neck and face, causing swelling. Denies difficulty breathing.

## 2013-08-26 NOTE — ED Provider Notes (Signed)
CSN: [id number]     Arrival date & time 08/26/13  1116 History  This chart was scribed for non-physician practitioner, Charlann Lange, PA-C working with Mirna Mires, MD by Frederich Balding, ED scribe. This patient was seen in room WTR6/WTR6 and the patient's care was started at 11:48 AM.   Chief Complaint  Patient presents with  . Rash   The history is provided by the patient. No language interpreter was used.   HPI Comments: Derek Carr is a 80 y.o. male with history of eczema who presents to the Emergency Department complaining of gradual onset rash to his arms, chest, neck and face that started 2 weeks ago. Reports associated facial swelling. Symptoms worsened last night and became very itchy. Denies significant outbreak of eczema over the last 2 years. Pt has an appointment with a dermatologist on 09/04/13 but states he could not wait. Denies trouble swallowing, difficulty breathing, SOB.   Past Medical History  Diagnosis Date  . Hyperlipidemia   . Hypertension   . Erectile dysfunction   . Prostate cancer 05/11/2011    bx=Adenocarcinoma,gleason3+4=7, 4+4=8,PSA=10.30volume=45.7cc  . History of asbestos exposure   . Nocturia   . History of shingles 2012-- BILATERAL EYES--  NO RESIDUAL  . Dermatitis, atopic ARMS AND LEGS  . OA (osteoarthritis) of hip RIGHT  . Degenerative arthritis   . Frequency of urination   . Smokers' cough   . Lumbar scoliosis   . History of radiation therapy 8/19-13-10/18/11    prostate, 46 GY  . Glaucoma   . DDD (degenerative disc disease)   . Diverticulosis   . Atherosclerosis   . Anemia   . Complication of anesthesia     CONFUSION - Pt's family very concerned about this  . Inguinal hernia   . Renal cyst     bilateral   Past Surgical History  Procedure Laterality Date  . Esophageus cyst removal  YRS AGO  . Umbilical hernia repair  1998    epigastric  . Prostate biopsy  05/11/11    Adenocarcinoma (MD OFFICE)  . Cardiac catheterization  02-24-2009   DR NASHER    MINOR CORONARY ARTERY IRREGULARITIES/ NORMAL LVSF/ EF 65-70%  . Attempted left vats/ left thoracotomy/ resection of the enormous, probable bronchogenic cyst  01-04-2005  DR Arlyce Dice  . Repair right inguinal hernia w/ mesh  09-10-1999  . Lumbar spine surgery  1991  . Radioactive seed implant  11/15/2011    Procedure: RADIOACTIVE SEED IMPLANT;  Surgeon: Dutch Gray, MD;  Location: [facility];  Service: Urology;  Laterality: N/A;  Total number of seeds - 52  . Cystoscopy  11/15/2011    Procedure: CYSTOSCOPY;  Surgeon: Dutch Gray, MD;  Location: [facility];  Service: Urology;  Laterality: N/A;  no seeds seen in bladder  . Colonoscopy w/ polypectomy    . Total hip arthroplasty Right 03/20/2013    Procedure: RIGHT TOTAL HIP ARTHROPLASTY ANTERIOR APPROACH;  Surgeon: [name], MD;  Location: [facility];  Service: Orthopedics;  Laterality: Right;  . Total hip revision Right 05/14/2013    Procedure: open reduction internal fixation REVISION RIGHT HIP ;  Surgeon: [name], MD;  Location: [facility];  Service: Orthopedics;  Laterality: Right;   Family History  Problem Relation Age of Onset  . Hypertension Mother   . Prostate cancer Brother     surgery/cured  . Lung cancer Brother     new primary/same brother   History  Substance Use  Topics  . Smoking status: Former Smoker -- 1.00 packs/day for 40 years    Types: Cigarettes    Quit date: 03/15/2012  . Smokeless tobacco: Never Used  . Alcohol Use: No    Review of Systems  Constitutional: Negative for fever.  HENT: Positive for facial swelling. Negative for trouble swallowing.   Respiratory: Negative for shortness of breath.   Skin: Positive for rash.  All other systems reviewed and are negative.  Allergies  Dilaudid; Methocarbamol; Percodan; Tramadol; and Vicodin  Home Medications   Prior to Admission medications   Medication Sig Start Date End Date Taking? Authorizing Provider   acetaminophen (TYLENOL) 500 MG tablet Take 1,000 mg by mouth 2 (two) times daily.   Yes Historical Provider, MD  aspirin 81 MG tablet Take 81 mg by mouth daily.   Yes Historical Provider, MD  bicalutamide (CASODEX) 50 MG tablet Take 50 mg by mouth every morning.    Yes Historical Provider, MD  brimonidine-timolol (COMBIGAN) 0.2-0.5 % ophthalmic solution Place 1 drop into both eyes every 12 (twelve) hours.   Yes Historical Provider, MD  cholecalciferol (VITAMIN D) 1000 UNITS tablet Take 1,000 Units by mouth daily.   Yes Historical Provider, MD  clobetasol cream (TEMOVATE) 9.47 % Apply 1 application topically as needed (for rash).   Yes Historical Provider, MD  Docosahexaenoic Acid (DHA OMEGA 3 PO) Take 1,600 mg by mouth daily.   Yes Historical Provider, MD  ezetimibe (ZETIA) 10 MG tablet Take 10 mg by mouth every morning.    Yes Historical Provider, MD  folic acid (FOLVITE) 1 MG tablet Take 1 mg by mouth daily.   Yes Historical Provider, MD  leuprolide (LUPRON) 30 MG injection Inject 30 mg into the muscle every 4 (four) months.   Yes Historical Provider, MD  meloxicam (MOBIC) 15 MG tablet Take 1 tablet by mouth daily as needed for pain.  06/27/13  Yes Historical Provider, MD  MYRBETRIQ 50 MG TB24 tablet Take 1 tablet by mouth daily.  06/28/13  Yes Historical Provider, MD  pravastatin (PRAVACHOL) 80 MG tablet Take 80 mg by mouth every evening.  08/17/10  Yes Historical Provider, MD  Tacrolimus (PROTOPIC EX) Apply 1 application topically as needed (for rash).   Yes Historical Provider, MD  Ubiquinol 100 MG CAPS Take 100 mg by mouth daily.   Yes Historical Provider, MD  valACYclovir (VALTREX) 500 MG tablet Take 500 mg by mouth daily.    Yes Historical Provider, MD   BP 143/87  Pulse 75  Temp(Src) 98.2 F (36.8 C) (Oral)  Resp 16  SpO2 96%  Physical Exam  Nursing note and vitals reviewed. Constitutional: He is oriented to person, place, and time. He appears well-developed and well-nourished. No  distress.  HENT:  Head: Normocephalic and atraumatic.  Eyes: Conjunctivae and EOM are normal.  Neck: Neck supple. No tracheal deviation present.  Cardiovascular: Normal rate.   Pulmonary/Chest: Effort normal. No respiratory distress.  Musculoskeletal: Normal range of motion.  Neurological: He is alert and oriented to person, place, and time.  Skin: Skin is warm and dry. Rash noted.  Raised normo pigmented, scaling, dry rash. In a consistent plaque on upper extremities, face and ears, consistent with eczema.   Psychiatric: He has a normal mood and affect. His behavior is normal.    ED Course  Procedures (including critical care time)  DIAGNOSTIC STUDIES: Oxygen Saturation is 96% on RA, normal by my interpretation.    COORDINATION OF CARE: 11:52 AM-Discussed treatment plan which  includes prednisone and hydrocortisone cream with pt at bedside and pt agreed to plan. Advised pt to keep his dermatology appointment and follow up.   Labs Review Labs Reviewed - No data to display  Imaging Review No results found.   EKG Interpretation None      MDM   Final diagnoses:  None    1. Eczema  Topical Temovate and steroid taper dose given. Encouraged to keep August 11th derm appointment.  I personally performed the services described in this documentation, which was scribed in my presence. The recorded information has been reviewed and is accurate.  Dewaine Oats, PA-C 08/26/13 1325

## 2013-08-26 NOTE — Discharge Instructions (Signed)
Eczema Eczema, also called atopic dermatitis, is a skin disorder that causes inflammation of the skin. It causes a red rash and dry, scaly skin. The skin becomes very itchy. Eczema is generally worse during the cooler winter months and often improves with the warmth of summer. Eczema usually starts showing signs in infancy. Some children outgrow eczema, but it may last through adulthood.  CAUSES  The exact cause of eczema is not known, but it appears to run in families. People with eczema often have a family history of eczema, allergies, asthma, or hay fever. Eczema is not contagious. Flare-ups of the condition may be caused by:   Contact with something you are sensitive or allergic to.   Stress. SIGNS AND SYMPTOMS  Dry, scaly skin.   Red, itchy rash.   Itchiness. This may occur before the skin rash and may be very intense.  DIAGNOSIS  The diagnosis of eczema is usually made based on symptoms and medical history. TREATMENT  Eczema cannot be cured, but symptoms usually can be controlled with treatment and other strategies. A treatment plan might include:  Controlling the itching and scratching.   Use over-the-counter antihistamines as directed for itching. This is especially useful at night when the itching tends to be worse.   Use over-the-counter steroid creams as directed for itching.   Avoid scratching. Scratching makes the rash and itching worse. It may also result in a skin infection (impetigo) due to a break in the skin caused by scratching.   Keeping the skin well moisturized with creams every day. This will seal in moisture and help prevent dryness. Lotions that contain alcohol and water should be avoided because they can dry the skin.   Limiting exposure to things that you are sensitive or allergic to (allergens).   Recognizing situations that cause stress.   Developing a plan to manage stress.  HOME CARE INSTRUCTIONS   Only take over-the-counter or  prescription medicines as directed by your health care provider.   Do not use anything on the skin without checking with your health care provider.   Keep baths or showers short (5 minutes) in warm (not hot) water. Use mild cleansers for bathing. These should be unscented. You may add nonperfumed bath oil to the bath water. It is best to avoid soap and bubble bath.   Immediately after a bath or shower, when the skin is still damp, apply a moisturizing ointment to the entire body. This ointment should be a petroleum ointment. This will seal in moisture and help prevent dryness. The thicker the ointment, the better. These should be unscented.   Keep fingernails cut short. Children with eczema may need to wear soft gloves or mittens at night after applying an ointment.   Dress in clothes made of cotton or cotton blends. Dress lightly, because heat increases itching.   A child with eczema should stay away from anyone with fever blisters or cold sores. The virus that causes fever blisters (herpes simplex) can cause a serious skin infection in children with eczema. SEEK MEDICAL CARE IF:   Your itching interferes with sleep.   Your rash gets worse or is not better within 1 week after starting treatment.   You see pus or soft yellow scabs in the rash area.   You have a fever.   You have a rash flare-up after contact with someone who has fever blisters.  Document Released: 01/09/2000 Document Revised: 11/01/2012 Document Reviewed: 08/14/2012 ExitCare Patient Information 2015 [employer]. This information   is not intended to replace advice given to you by your health care provider. Make sure you discuss any questions you have with your health care provider.  

## 2013-08-27 NOTE — ED Provider Notes (Signed)
Medical screening examination/treatment/procedure(s) were performed by non-physician practitioner and as supervising physician I was immediately available for consultation/collaboration.     Mirna Mires, MD 08/27/13 [phone]

## 2013-08-28 ENCOUNTER — Other Ambulatory Visit: Payer: Medicare Other

## 2013-08-31 ENCOUNTER — Ambulatory Visit
Admission: RE | Admit: 2013-08-31 | Discharge: 2013-08-31 | Disposition: A | Discharge status: Home / Self Care | Payer: Medicare Other | Source: Ambulatory Visit | Attending: Internal Medicine | Admitting: Internal Medicine

## 2013-08-31 DIAGNOSIS — I951 Orthostatic hypotension: Secondary | ICD-10-CM

## 2013-08-31 DIAGNOSIS — J984 Other disorders of lung: Secondary | ICD-10-CM

## 2013-08-31 DIAGNOSIS — R5381 Other malaise: Secondary | ICD-10-CM

## 2013-08-31 DIAGNOSIS — N281 Cyst of kidney, acquired: Secondary | ICD-10-CM

## 2013-08-31 DIAGNOSIS — R42 Dizziness and giddiness: Secondary | ICD-10-CM

## 2013-08-31 DIAGNOSIS — R5383 Other fatigue: Principal | ICD-10-CM

## 2013-08-31 MED ORDER — IOHEXOL 300 MG/ML  SOLN
100.0000 mL | Freq: Once | INTRAMUSCULAR | Status: AC | PRN
  Administered 2013-08-31: 100 mL via INTRAVENOUS

## 2013-09-02 ENCOUNTER — Encounter: Payer: Self-pay | Admitting: Neurology

## 2013-09-02 ENCOUNTER — Ambulatory Visit (INDEPENDENT_AMBULATORY_CARE_PROVIDER_SITE_OTHER): Payer: Medicare Other | Admitting: Neurology

## 2013-09-02 DIAGNOSIS — F05 Delirium due to known physiological condition: Secondary | ICD-10-CM

## 2013-09-02 DIAGNOSIS — G4733 Obstructive sleep apnea (adult) (pediatric): Secondary | ICD-10-CM

## 2013-09-02 DIAGNOSIS — G4737 Central sleep apnea in conditions classified elsewhere: Secondary | ICD-10-CM

## 2013-09-02 DIAGNOSIS — R55 Syncope and collapse: Secondary | ICD-10-CM

## 2013-09-02 DIAGNOSIS — J441 Chronic obstructive pulmonary disease with (acute) exacerbation: Secondary | ICD-10-CM

## 2013-09-02 DIAGNOSIS — R410 Disorientation, unspecified: Secondary | ICD-10-CM

## 2013-09-02 DIAGNOSIS — G4731 Primary central sleep apnea: Secondary | ICD-10-CM

## 2013-09-03 ENCOUNTER — Other Ambulatory Visit: Payer: Self-pay | Admitting: Internal Medicine

## 2013-09-03 DIAGNOSIS — E041 Nontoxic single thyroid nodule: Secondary | ICD-10-CM

## 2013-09-04 ENCOUNTER — Other Ambulatory Visit: Payer: Medicare Other

## 2013-09-05 NOTE — Sleep Study (Signed)
See media tab for full report  

## 2013-09-06 ENCOUNTER — Other Ambulatory Visit: Payer: Medicare Other

## 2013-09-11 ENCOUNTER — Telehealth: Payer: Self-pay | Admitting: Neurology

## 2013-09-11 NOTE — Telephone Encounter (Signed)
Patient's daughter [name] calling to request patient's sleep study results. Please return call and advise.

## 2013-09-13 NOTE — Telephone Encounter (Signed)
Called and spoke with the patient's daughter and informed her that the study has not been read. Study was done on 09-02-2013 . Dr. Brett Fairy next read day will on Tuesday the August 25. and someone will call with results.

## 2013-09-24 ENCOUNTER — Telehealth: Payer: Self-pay | Admitting: Neurology

## 2013-09-24 NOTE — Telephone Encounter (Signed)
I called the patient's daughter [name]) and provided sleep study results.  Advised her that the sleep study revealed that the patient was found to complex sleep apnea.  He was tried on both CPAP and BIPAP before ending with ASV.  She is aware that they were unable to find an optimal treatment pressure so Dr. Brett Fairy has recommended that he return for a full night titration study.  She would like to go over the results from this test with Dr. Brett Fairy prior to scheduling but mentioned that she would call back after checking her calendar.

## 2013-10-04 ENCOUNTER — Ambulatory Visit
Admission: RE | Admit: 2013-10-04 | Discharge: 2013-10-04 | Disposition: A | Discharge status: Home / Self Care | Payer: Medicare Other | Source: Ambulatory Visit | Attending: Internal Medicine | Admitting: Internal Medicine

## 2013-10-04 DIAGNOSIS — E041 Nontoxic single thyroid nodule: Secondary | ICD-10-CM

## 2013-10-05 ENCOUNTER — Encounter: Payer: Self-pay | Admitting: Neurology

## 2013-10-05 ENCOUNTER — Ambulatory Visit (INDEPENDENT_AMBULATORY_CARE_PROVIDER_SITE_OTHER): Payer: Medicare Other | Admitting: Neurology

## 2013-10-05 VITALS — BP 120/76 | HR 83 | Resp 17 | Wt 180.0 lb

## 2013-10-05 DIAGNOSIS — J449 Chronic obstructive pulmonary disease, unspecified: Secondary | ICD-10-CM

## 2013-10-05 DIAGNOSIS — G4731 Primary central sleep apnea: Secondary | ICD-10-CM

## 2013-10-05 DIAGNOSIS — G4730 Sleep apnea, unspecified: Secondary | ICD-10-CM

## 2013-10-05 NOTE — Patient Instructions (Signed)
Sleep Apnea  Sleep apnea is disorder that affects a person's sleep. A person with sleep apnea has abnormal pauses in their breathing when they sleep. It is hard for them to get a good sleep. This makes a person tired during the day. It also can lead to other physical problems. There are three types of sleep apnea. One type is when breathing stops for a short time because your airway is blocked (obstructive sleep apnea). Another type is when the brain sometimes fails to give the normal signal to breathe to the muscles that control your breathing (central sleep apnea). The third type is a combination of the other two types.  HOME CARE  · Do not sleep on your back. Try to sleep on your side.  · Take all medicine as told by your doctor.  · Avoid alcohol, calming medicines (sedatives), and depressant drugs.  · Try to lose weight if you are overweight. Talk to your doctor about a healthy weight goal.  Your doctor may have you use a device that helps to open your airway. It can help you get the air that you need. It is called a positive airway pressure (PAP) device. There are three types of PAP devices:  · Continuous positive airway pressure (CPAP) device.  · Nasal expiratory positive airway pressure (EPAP) device.  · Bilevel positive airway pressure (BPAP) device.  MAKE SURE YOU:  · Understand these instructions.  · Will watch your condition.  · Will get help right away if you are not doing well or get worse.  Document Released: 10/21/2007 Document Revised: 12/29/2011 Document Reviewed: 05/15/2011  ExitCare® Patient Information ©2015 [employer]. This information is not intended to replace advice given to you by your health care provider. Make sure you discuss any questions you have with your health care provider.

## 2013-10-05 NOTE — Addendum Note (Signed)
Addended by: [name] on: 10/05/2013 12:01 PM   Modules accepted: Orders

## 2013-10-05 NOTE — Progress Notes (Signed)
[facility]  Provider:  Larey Carr, M D  Referring Provider: Jerlyn Ly, MD Primary Care Physician:  Derek Ly, MD  Chief Complaint  Patient presents with  . Follow-up    Room 10  . Results    HPI:  Derek Carr is a 80 y.o. male  Is seen here as a revisit  from Dr. Joylene Carr,   [name] underwent a split-night polysomnography on 09-02-13.   The patient was diagnosed as mostly central apneas at an AHI of 27.7 in supine his AHI was 34.3,  in nonsupine 7.5- there was no REM sleep noted in the diagnostic part of the study.  Oxygen desaturation did not take place!  his lowest saturation was 90% with only a third of a minutes duration.  The patient had sinus arrhythmia. Variable R. to R. intervals.  CPAP titration begun but the patient developed severe central apneas and response the same operative under BiPAP pressures of 8 a 4 cm water and 10/4 cm of water.   Adapt SV was finally initiated with a pressure support of  4 centimeter water  minimum and 10 cm maximum - he was able now to rebound into REM sleep and his AHI became 0.0. His diagnosis is that of complex sleep apnea, Adapt SV  will be ordered as an EEP of 5 cm water,  minimum pressure support of 4 cm - maximum pressure support of 10 cm water and  airfit p 10 nasal pillow in large size was used during the study this is recommended to be used with the adapt SV machine.   The abdomen today to discuss the outcome of the study the patient's fatigue questionnaire today was and was at 26, his sleepiness score is very high at 21 Epworth points,  and 3 points were endorsed on the geriatric depression.   I recommend to revisit after adapt SV has been used for 30 days minimum and then repeat also or a cognitive testing battery.  Dr. Joylene Carr re-evalauted this patient's nocturnal pulse and oxygenation in an overnight pulse oximetry.  The test was proceeded by 3 episodes of extreme sleepiness, lethargy and limited  responsiveness. His daughter felt this may represent a seizure. She took his heart rate, which was in the sixties and his BP was low. She elevated his feet, which helped to overcome the symptoms. He is sleepy a lot, she reports. He still has nocturia ,5-6 times each night. The pulse oximetry did not sure the patient does suffer from prolonged hypoxia, but his pulses are variable Endeavor multiple periods of oxygen desaturation for short or brief periods of time. Dr. Joylene Carr felt that this warrants an evaluation for possible obstructive sleep apnea. Derek Carr evaluated the patient's pulmonary function and found +2 with COPD and several small lung nodules.  Dr. Jana Carr felt these were not big enough to biopsy, unlikely cancerous, and should be watched.  This patients dizziness resolved after hip surgery- indicating that there was some instability of gait as well as orthostatic problems. He has had the  3 spells as above described since he underwent the 2 hip surgeries.      Last visit note 2014  :  [name] is here today after a 6 month history of balance and orthostasis  problems. He has been followed by Dr. Redmond Carr and Dr. Joylene Carr,  he also urologist Dr. Raynelle Carr.The patient underwent an ear nose and throat evaluation on 09/26/2012, a head CT had revealed some right  ear mass / cyst, and his presenting  dizziness symptoms were something more "like imbalance" and not much vertigo by description. The ENT note related findings and recommended proceeding with vestibular rehabilitation and a neurologic evaluation.  The patient started with dizziness symptoms in April  2014 he fell early twice, he fell that he "staggered" when he first got up from a seated position and he felt also that when he was leaning forward he seemed to have chased his point of gravity. He had propulsive falls.   There was no spinning sensation - he  was not bothered when he was standing up -without movement. He also had not felt  any trouble when lying down or lying on the left or right side. The imbalance was  always to be postural and triggered by movements. . Dr. Joylene Carr, his internal medicine specialist,  changed some of his blood pressure medications,  hoping that this would alleviate the symptoms. Amlodipine was the first medication to be reduced and also finally weaned off ,  it did not lead to a greater stability for the patient. VESIcare was investigated and started at 10 mg and replaced Flomax.  Derek Carr was stopped after that. This happened about 2 months ago meanwhile over the last 8 weeks [name] balance and orthostatic dizziness has improved.  He is back on VESIcare and still is tolerating this medicine along pretty well.  His daughter , a Therapist, sports,  reports that her father , who used vesicare and other meds to control urination, is now up every hour with nocturia.  He is a slender man , he has prostrate cancer and had radiation therapy by beam and seeds, and Lupron injections.  He get's less sleep. He has hot flushes and he is bothered by those.  He is neither fatigued nor in pain, but naps many times in daytime, his nocturnal sleep no longer being refreshing.  Sleep time at 10 PM and he will fall asleep promptly, he will rise at 8 AM, but only 6-6.5 hours are truly asleep.   Review of Systems: Out of a complete 14 system review, the patient complains of only the following symptoms, and all other reviewed systems are negative. No shortness of breath, walks with cane, he doesn't snore according to wife and daughter. 3 spells of sudden weakness, lethargy  and slurred speech.    History   Social History  . Marital Status: Married    Spouse Name: Derek Carr    Number of Children: 4  . Years of Education: College   Occupational History  .    [employer]    Retired   Social History Main Topics  . Smoking status: Former Smoker -- 1.00 packs/day for 40 years    Types: Cigarettes    Quit date: 03/15/2012   . Smokeless tobacco: Never Used  . Alcohol Use: No  . Drug Use: No     Comment: quit smoking 02/2012  . Sexual Activity: Not on file   Other Topics Concern  . Not on file   Social History Narrative   Patient is Married Designer, television/film set), and lives at home with his wife 75 years   Patient has 3 daughters and 1 son.   Patient has a college education.   Patient is right-handed.   Patient drinks one cup of coffee, 1-2 cups of tea daily and rarely drinks soda.   Retired from [employer] History  Problem Relation Age of Onset  . Hypertension Mother   .  Prostate cancer Brother     surgery/cured  . Lung cancer Brother     new primary/same brother    Past Medical History  Diagnosis Date  . Hyperlipidemia   . Hypertension   . Erectile dysfunction   . Prostate cancer 05/11/2011    bx=Adenocarcinoma,gleason3+4=7, 4+4=8,PSA=10.30volume=45.7cc  . History of asbestos exposure   . Nocturia   . History of shingles 2012-- BILATERAL EYES--  NO RESIDUAL  . Dermatitis, atopic ARMS AND LEGS  . OA (osteoarthritis) of hip RIGHT  . Degenerative arthritis   . Frequency of urination   . Smokers' cough   . Lumbar scoliosis   . History of radiation therapy 8/19-13-10/18/11    prostate, 74 GY  . Glaucoma   . DDD (degenerative disc disease)   . Diverticulosis   . Atherosclerosis   . Anemia   . Complication of anesthesia     CONFUSION - Pt's family very concerned about this  . Inguinal hernia   . Renal cyst     bilateral    Past Surgical History  Procedure Laterality Date  . Esophageus cyst removal  YRS AGO  . Umbilical hernia repair  1998    epigastric  . Prostate biopsy  05/11/11    Adenocarcinoma ([facility] OFFICE)  . Cardiac catheterization  02-24-2009  DR NASHER    MINOR CORONARY ARTERY IRREGULARITIES/ NORMAL LVSF/ EF 65-70%  . Attempted left vats/ left thoracotomy/ resection of the enormous, probable bronchogenic cyst  01-04-2005  DR Arlyce Dice  . Repair right inguinal hernia w/ mesh   09-10-1999  . Lumbar spine surgery  1991  . Radioactive seed implant  11/15/2011    Procedure: RADIOACTIVE SEED IMPLANT;  Surgeon: Dutch Gray, MD;  Location: [facility];  Service: Urology;  Laterality: N/A;  Total number of seeds - 52  . Cystoscopy  11/15/2011    Procedure: CYSTOSCOPY;  Surgeon: Dutch Gray, MD;  Location: [facility];  Service: Urology;  Laterality: N/A;  no seeds seen in bladder  . Colonoscopy w/ polypectomy    . Total hip arthroplasty Right 03/20/2013    Procedure: RIGHT TOTAL HIP ARTHROPLASTY ANTERIOR APPROACH;  Surgeon: [name], MD;  Location: [facility];  Service: Orthopedics;  Laterality: Right;  . Total hip revision Right 05/14/2013    Procedure: open reduction internal fixation REVISION RIGHT HIP ;  Surgeon: [name], MD;  Location: [facility];  Service: Orthopedics;  Laterality: Right;    Current Outpatient Prescriptions  Medication Sig Dispense Refill  . acetaminophen (TYLENOL) 500 MG tablet Take 1,000 mg by mouth 2 (two) times daily.      Marland Kitchen aspirin 81 MG tablet Take 81 mg by mouth daily.      . cholecalciferol (VITAMIN D) 1000 UNITS tablet Take 1,000 Units by mouth daily.      . clobetasol cream (TEMOVATE) 8.65 % Apply 1 application topically as needed (for rash).      . clobetasol cream (TEMOVATE) 7.84 % Apply 1 application topically 2 (two) times daily. DO NOT USE ON THE FACE  30 g  0  . Docosahexaenoic Acid (DHA OMEGA 3 PO) Take 1,600 mg by mouth daily.      Marland Kitchen ezetimibe (ZETIA) 10 MG tablet Take 10 mg by mouth every morning.       . folic acid (FOLVITE) 1 MG tablet Take 1 mg by mouth daily.      . meloxicam (MOBIC) 15 MG tablet Take 1 tablet by mouth daily as needed  for pain.       Marland Kitchen MYRBETRIQ 50 MG TB24 tablet Take 1 tablet by mouth daily.       . pravastatin (PRAVACHOL) 80 MG tablet Take 80 mg by mouth every evening.       . Tacrolimus (PROTOPIC EX) Apply 1 application topically as needed (for rash).      . timolol  (TIMOPTIC) 0.25 % ophthalmic solution Place 1 drop into both eyes.      Marland Kitchen Ubiquinol 100 MG CAPS Take 100 mg by mouth daily.      . valACYclovir (VALTREX) 500 MG tablet Take 500 mg by mouth daily.        No current facility-administered medications for this visit.    Allergies as of 10/05/2013 - Review Complete 10/05/2013  Allergen Reaction Noted  . Dilaudid [hydromorphone hcl]  05/07/2013  . Methocarbamol  05/07/2013  . Percodan [oxycodone-aspirin]  05/07/2013  . Tramadol  05/07/2013  . Vicodin [hydrocodone-acetaminophen]  05/07/2013    Vitals: BP 120/76  Pulse 83  Resp 17  Wt 180 lb (81.647 kg) Last Weight:  Wt Readings from Last 1 Encounters:  10/05/13 180 lb (81.647 kg)   Last Height:   Ht Readings from Last 1 Encounters:  07/17/13 6\' 1"  (1.854 m)    Physical exam:  General: The patient is awake, alert and appears not in acute distress. The patient is well groomed. Head: Normocephalic, atraumatic. Neck is supple. Mallampati 2 , neck circumference:16 inches.    Cardiovascular:  Regular rate and rhythm , without  murmurs , i found a milder left over right  carotid bruit,  No distended neck veins. Respiratory: Lungs are clear to auscultation. Skin:  Without evidence of edema, or rash Trunk: BMI is normal.  Neurologic exam : The patient is awake and alert, oriented to place and time.   Memory subjective described as intact. There is a normal attention span & concentration ability.  We performed a MMSE and this patient had significant deficits in recall, calculation and scored 21/30 points.  Speech is fluent without  dysarthria, dysphonia or aphasia.  Mood and affect are appropriate.  Cranial nerves: Pupils are equal and briskly reactive to light. Funduscopic exam without  evidence of pallor or edema. Extraocular movements  in vertical and horizontal planes intact and without nystagmus.  Visual fields by finger perimetry are intact. Hearing to finger rub intact.   Facial sensation intact to fine touch. Facial motor strength is symmetric and tongue and uvula move midline.  Motor exam:    Normal tone and normal muscle bulk and symmetric strength in all extremities. He has used a walker until recently , now walks with a cane. He is in PT.  Sensory:  Fine touch, pinprick and vibration were tested in all extremities. Proprioception is normal.  Coordination: Rapid alternating movements in the fingers/hands is tested and normal. Finger-to-nose maneuver  normal without evidence of ataxia,or tremor.  Gait and station: Patient walks with a cane - assistive device  Stance is stable and normal. Romberg testing showed swaying , but he is not falling.   Deep tendon reflexes: in the  upper and lower extremities are symmetric and intact. Babinski maneuver response is downgoing.   Assessment:  After physical and neurologic examination, review of laboratory studies, imaging, neurophysiology testing and pre-existing records, assessment is   1) 3 spells of sudden lethargy, weakness delayed verbal response time. TIA or seizure? He was confused after each anesthesia with the recent surgeries. Early dementia ,  delirium ?.  2) nocturia will improve.  ,     diagnosed CSA / or complex sleep, not   OSA, no snoring. Responded only to adapt SV , not CPAP or BIPAP.  3) no acute cerebrovascular abnormalities. Imaging reviewed. Cardiac sinus arryhthmia : he wore a 30 day monitor without irregularities.  Carotid doppler normal for age.  Plan:  Treatment plan and additional workup :  I strongly support this patient with mild COPD and EF of 60 % to pursue adapt SV at the ordered settings. His mild cognitive impairment may improve.   I discussed Aricept.  I prefer to have the patient tested by [facility] /MMSE after 30- 60  days of adapt SV use.

## 2013-10-16 ENCOUNTER — Other Ambulatory Visit: Payer: Self-pay | Admitting: Adult Health

## 2013-10-17 ENCOUNTER — Other Ambulatory Visit: Payer: Self-pay | Admitting: Adult Health

## 2013-10-18 ENCOUNTER — Ambulatory Visit: Payer: Medicare Other | Admitting: Neurology

## 2014-01-07 ENCOUNTER — Encounter: Payer: Self-pay | Admitting: Neurology

## 2014-01-07 ENCOUNTER — Ambulatory Visit (INDEPENDENT_AMBULATORY_CARE_PROVIDER_SITE_OTHER): Payer: Medicare Other | Admitting: Neurology

## 2014-01-07 VITALS — BP 135/87 | HR 89 | Resp 22 | Ht 72.25 in | Wt 193.0 lb

## 2014-01-07 DIAGNOSIS — F0390 Unspecified dementia without behavioral disturbance: Secondary | ICD-10-CM

## 2014-01-07 DIAGNOSIS — G4731 Primary central sleep apnea: Secondary | ICD-10-CM

## 2014-01-07 DIAGNOSIS — F0280 Dementia in other diseases classified elsewhere without behavioral disturbance: Secondary | ICD-10-CM | POA: Insufficient documentation

## 2014-01-07 DIAGNOSIS — G309 Alzheimer's disease, unspecified: Secondary | ICD-10-CM

## 2014-01-07 DIAGNOSIS — F068 Other specified mental disorders due to known physiological condition: Secondary | ICD-10-CM

## 2014-01-07 MED ORDER — DONEPEZIL HCL 5 MG PO TABS: 5.0000 mg | ORAL_TABLET | Freq: Every day | ORAL | Status: DC

## 2014-01-07 NOTE — Patient Instructions (Signed)
Donepezil tablets  Aricept at 5 mg daily will be initaited and you c should progress to 10 mg daily in the next 90 days. Rv with NP ordered for the next 90 days.   What is this medicine? DONEPEZIL (doe NEP e zil) is used to treat mild to moderate dementia caused by Alzheimer's disease. This medicine may be used for other purposes; ask your health care provider or pharmacist if you have questions. COMMON BRAND NAME(S): Aricept What should I tell my health care provider before I take this medicine? They need to know if you have any of these conditions: -asthma or other lung disease -difficulty passing urine -head injury -heart disease -history of irregular heartbeat -liver disease -seizures (convulsions) -stomach or intestinal disease, ulcers or stomach bleeding -an unusual or allergic reaction to donepezil, other medicines, foods, dyes, or preservatives -pregnant or trying to get pregnant -breast-feeding How should I use this medicine? Take this medicine by mouth with a glass of water. Follow the directions on the prescription label. You may take this medicine with or without food. Take this medicine at regular intervals. This medicine is usually taken before bedtime. Do not take it more often than directed. Continue to take your medicine even if you feel better. Do not stop taking except on your doctor's advice. If you are taking the 23 mg donepezil tablet, swallow it whole; do not cut, crush, or chew it. Talk to your pediatrician regarding the use of this medicine in children. Special care may be needed. Overdosage: If you think you have taken too much of this medicine contact a poison control center or emergency room at once. NOTE: This medicine is only for you. Do not share this medicine with others. What if I miss a dose? If you miss a dose, take it as soon as you can. If it is almost time for your next dose, take only that dose, do not take double or extra doses. What may interact  with this medicine? Do not take this medicine with any of the following medications: -certain medicines for fungal infections like itraconazole, fluconazole, posaconazole, and voriconazole -cisapride -dextromethorphan; quinidine -dofetilide -dronedarone -pimozide -quinidine -thioridazine -ziprasidone This medicine may also interact with the following medications: -antihistamines for allergy, cough and cold -atropine -bethanechol -carbamazepine -certain medicines for bladder problems like oxybutynin, tolterodine -certain medicines for Parkinson's disease like benztropine, trihexyphenidyl -certain medicines for stomach problems like dicyclomine, hyoscyamine -certain medicines for travel sickness like scopolamine -dexamethasone -ipratropium -NSAIDs, medicines for pain and inflammation, like ibuprofen or naproxen -other medicines for Alzheimer's disease -other medicines that prolong the QT interval (cause an abnormal heart rhythm) -phenobarbital -phenytoin -rifampin, rifabutin or rifapentine This list may not describe all possible interactions. Give your health care provider a list of all the medicines, herbs, non-prescription drugs, or dietary supplements you use. Also tell them if you smoke, drink alcohol, or use illegal drugs. Some items may interact with your medicine. What should I watch for while using this medicine? Visit your doctor or health care professional for regular checks on your progress. Check with your doctor or health care professional if your symptoms do not get better or if they get worse. You may get drowsy or dizzy. Do not drive, use machinery, or do anything that needs mental alertness until you know how this drug affects you. What side effects may I notice from receiving this medicine? Side effects that you should report to your doctor or health care professional as soon as possible: -allergic reactions like  skin rash, itching or hives, swelling of the face,  lips, or tongue -changes in vision -feeling faint or lightheaded, falls -problems with balance -slow heartbeat, or palpitations -stomach pain -unusual bleeding or bruising, red or purple spots on the skin -vomiting -weight loss Side effects that usually do not require medical attention (report to your doctor or health care professional if they continue or are bothersome): -diarrhea, especially when starting treatment -headache -indigestion or heartburn -loss of appetite -muscle cramps -nausea This list may not describe all possible side effects. Call your doctor for medical advice about side effects. You may report side effects to FDA at 1-800-FDA-1088. Where should I keep my medicine? Keep out of reach of children. Store at room temperature between 15 and 30 degrees C (59 and 86 degrees F). Throw away any unused medicine after the expiration date. NOTE: This sheet is a summary. It may not cover all possible information. If you have questions about this medicine, talk to your doctor, pharmacist, or health care provider.  2015, [employer] Standard. (2013-04-30 21:44:11)

## 2014-01-07 NOTE — Progress Notes (Signed)
[facility]  Provider:  [name], M D  Referring Provider: Jerlyn Ly, MD Primary Care Physician:  Jerlyn Ly, MD  Chief Complaint  Patient presents with  . RV sleep cpap    Rm 11, alone    HPI:  Derek Carr is a 80 y.o. male  Is seen here as a revisit  from Dr. Joylene Draft,   [name] underwent a split-night polysomnography on 09-02-13.  The patient was diagnosed as mostly central apneas at an AHI of 27.7 in supine his AHI was 34.3,  in nonsupine 7.5- there was no REM sleep noted in the diagnostic part of the study.  Oxygen desaturation did not take place!  his lowest saturation was 90% with only a third of a minutes duration. The patient had sinus arrhythmia. Variable R. to R. intervals.  CPAP titration begun but the patient developed severe central apneas and response the same operative under BiPAP pressures of 8 a 4 cm water and 10/4 cm of water.  Adapt SV was finally initiated with a pressure support of  4 centimeter water  minimum and 10 cm maximum - he was able now to rebound into REM sleep and his AHI became 0.0. His diagnosis is that of complex sleep apnea, Adapt SV  will be ordered as an EEP of 5 cm water,  minimum pressure support of 4 cm - maximum pressure support of 10 cm water and  airfit p 10 nasal pillow in large size was used during the study this is recommended to be used with the adapt SV machine. The discussion of  the outcome of the study the patient's fatigue questionnaire today was 26, his sleepiness score  21  (Epworth) points,  and 3 points were endorsed on  geriatric depression. I recommend to revisit after adapt SV has been used for 30 days minimum and then repeat also or a cognitive testing battery.  Dr. Joylene Draft re-evalauted this patient's nocturnal pulse and oxygenation in an overnight pulse oximetry.  The test was proceeded by 3 episodes of extreme sleepiness, lethargy and limited responsiveness. His daughter felt this may  represent a seizure. She took his heart rate, which was in the sixties and his BP was low. She elevated his feet, which helped to overcome the symptoms. He is sleepy a lot, she reports. He still has nocturia ,5-6 times each night. The pulse oximetry did not sure the patient does suffer from prolonged hypoxia, but his pulses are variable Endeavor multiple periods of oxygen desaturation for short or brief periods of time. Dr. Joylene Draft felt that this warrants an evaluation for possible obstructive sleep apnea. Dr. Arrie Eastern evaluated the patient's pulmonary function and found +2 with COPD and several small lung nodules. Dr. Jana Hakim felt these were not big enough to biopsy, unlikely cancerous, and should be watched.  This patients dizziness resolved after hip surgery- indicating that there was some instability of gait as well as orthostatic problems. He has had the  3 spells as above described since he underwent the 2 hip surgeries.   01-07-14; Patient here with his daughter, has been using CPAP, today's download shows a 30 day compliance of 93.3%, his residual AHI is 6.4, 90% of the time his expiratory pressure is 7.3 cm and his pressure support 3.4 cm water. His nocturia has also not decreased, and his Epworth sleepiness today remains stubbornly at 20 points, his fatigue severity at 22 points. Since his apnea is sufficiently treated I don't think that it  is the only cause for his remaining sleepiness or nocturia. He has a thyroid nodule and  awaits biopsy.   He feel his OSA related therapy is as good as it gets.   Review of Systems: Out of a complete 14 system review, the patient complains of only the following symptoms, and all other reviewed systems are negative. No shortness of breath, walks with cane, he doesn't snore according to wife and daughter. 3 spells of sudden weakness, lethargy  and slurred speech.    History   Social History  . Marital Status: Married    Spouse Name: [name]    Number of  Children: 4  . Years of Education: College   Occupational History  .    [employer]    Retired   Social History Main Topics  . Smoking status: Former Smoker -- 1.00 packs/day for 40 years    Types: Cigarettes    Quit date: 03/15/2012  . Smokeless tobacco: Never Used  . Alcohol Use: No  . Drug Use: No     Comment: quit smoking 02/2012  . Sexual Activity: Not on file   Other Topics Concern  . Not on file   Social History Narrative   Patient is Married Designer, television/film set), and lives at home with his wife 51 years   Patient has 3 daughters and 1 son.   Patient has a college education.   Patient is right-handed.   Patient drinks one cup of coffee, 1-2 cups of tea daily and rarely drinks soda.   Retired from [employer] History  Problem Relation Age of Onset  . Hypertension Mother   . Prostate cancer Brother     surgery/cured  . Lung cancer Brother     new primary/same brother    Past Medical History  Diagnosis Date  . Hyperlipidemia   . Hypertension   . Erectile dysfunction   . Prostate cancer 05/11/2011    bx=Adenocarcinoma,gleason3+4=7, 4+4=8,PSA=10.30volume=45.7cc  . History of asbestos exposure   . Nocturia   . History of shingles 2012-- BILATERAL EYES--  NO RESIDUAL  . Dermatitis, atopic ARMS AND LEGS  . OA (osteoarthritis) of hip RIGHT  . Degenerative arthritis   . Frequency of urination   . Smokers' cough   . Lumbar scoliosis   . History of radiation therapy 8/19-13-10/18/11    prostate, 67 GY  . Glaucoma   . DDD (degenerative disc disease)   . Diverticulosis   . Atherosclerosis   . Anemia   . Complication of anesthesia     CONFUSION - Pt's family very concerned about this  . Inguinal hernia   . Renal cyst     bilateral    Past Surgical History  Procedure Laterality Date  . Esophageus cyst removal  YRS AGO  . Umbilical hernia repair  1998    epigastric  . Prostate biopsy  05/11/11    Adenocarcinoma ([facility] OFFICE)  . Cardiac catheterization   02-24-2009  DR NASHER    MINOR CORONARY ARTERY IRREGULARITIES/ NORMAL LVSF/ EF 65-70%  . Attempted left vats/ left thoracotomy/ resection of the enormous, probable bronchogenic cyst  01-04-2005  DR Arlyce Dice  . Repair right inguinal hernia w/ mesh  09-10-1999  . Lumbar spine surgery  1991  . Radioactive seed implant  11/15/2011    Procedure: RADIOACTIVE SEED IMPLANT;  Surgeon: Dutch Gray, MD;  [facility];  Service: Urology;  Laterality: N/A;  Total number of seeds - 52  . Cystoscopy  11/15/2011    Procedure: CYSTOSCOPY;  Surgeon: Dutch Gray, MD;  Location: [facility];  Service: Urology;  Laterality: N/A;  no seeds seen in bladder  . Colonoscopy w/ polypectomy    . Total hip arthroplasty Right 03/20/2013    Procedure: RIGHT TOTAL HIP ARTHROPLASTY ANTERIOR APPROACH;  Surgeon: [name], MD;  Location: [facility];  Service: Orthopedics;  Laterality: Right;  . Total hip revision Right 05/14/2013    Procedure: open reduction internal fixation REVISION RIGHT HIP ;  Surgeon: [name], MD;  Location: [facility];  Service: Orthopedics;  Laterality: Right;    Current Outpatient Prescriptions  Medication Sig Dispense Refill  . acetaminophen (TYLENOL) 500 MG tablet Take 1,000 mg by mouth 2 (two) times daily.    Marland Kitchen aspirin 81 MG tablet Take 81 mg by mouth daily.    . cholecalciferol (VITAMIN D) 1000 UNITS tablet Take 1,000 Units by mouth daily.    . clobetasol cream (TEMOVATE) 4.58 % Apply 1 application topically 2 (two) times daily. DO NOT USE ON THE FACE 30 g 0  . Docosahexaenoic Acid (DHA OMEGA 3 PO) Take 1,600 mg by mouth daily.    Marland Kitchen ezetimibe (ZETIA) 10 MG tablet Take 10 mg by mouth every morning.     . folic acid (FOLVITE) 1 MG tablet Take 1 mg by mouth daily.    . meloxicam (MOBIC) 15 MG tablet Take 1 tablet by mouth daily as needed for pain.     Marland Kitchen MYRBETRIQ 50 MG TB24 tablet Take 1 tablet by mouth daily.     . pravastatin (PRAVACHOL) 80 MG tablet Take 80  mg by mouth every evening.     . Tacrolimus (PROTOPIC EX) Apply 1 application topically as needed (for rash).    . timolol (TIMOPTIC) 0.25 % ophthalmic solution Place 1 drop into both eyes.    Marland Kitchen Ubiquinol 100 MG CAPS Take 100 mg by mouth daily.    . valACYclovir (VALTREX) 500 MG tablet Take 500 mg by mouth daily.      No current facility-administered medications for this visit.    Allergies as of 01/07/2014 - Review Complete 01/07/2014  Allergen Reaction Noted  . Dilaudid [hydromorphone hcl]  05/07/2013  . Methocarbamol  05/07/2013  . Percodan [oxycodone-aspirin]  05/07/2013  . Tramadol  05/07/2013  . Vicodin [hydrocodone-acetaminophen]  05/07/2013    Vitals: BP 135/87 mmHg  Pulse 89  Resp 22  Ht 6' 0.25" (1.835 m)  Wt 193 lb (87.544 kg)  BMI 26.00 kg/m2 Last Weight:  Wt Readings from Last 1 Encounters:  01/07/14 193 lb (87.544 kg)   Last Height:   Ht Readings from Last 1 Encounters:  01/07/14 6' 0.25" (1.835 m)    Physical exam:  General: The patient is awake, alert and appears not in acute distress. The patient is well groomed. Head: Normocephalic, atraumatic. Neck is supple. Mallampati 2 , neck circumference:16 inches.    Cardiovascular:  Regular rate and rhythm , without  murmurs , i found a milder left over right  carotid bruit,  No distended neck veins. Respiratory: Lungs are clear to auscultation. Skin:  Without evidence of edema, or rash Trunk: BMI is normal.  Neurologic exam : The patient is awake and alert, oriented to place and time.   Memory subjective described as intact. There is a normal attention span & concentration ability.  We performed a MOCA today , and the patient did not succeed with trail making, three dimensional image, but was  fully oriented. He was unable to draw a three dimesional cube, and was not able to draw a clock face.  With the hands set at 10 past 11 o'clock.  21 out of 30 points, started aricept today.     MMSE last visit : this  patient had significant deficits in recall, calculation and scored 21/30 points.   Speech is fluent without dysarthria, dysphonia or aphasia.  Mood and affect are appropriate.  Cranial nerves: Pupils are equal and briskly reactive to light. Funduscopic exam without  evidence of pallor or edema. Extraocular movements  in vertical and horizontal planes intact and without nystagmus.  Visual fields by finger perimetry are intact. Hearing to finger rub intact.  Facial sensation intact to fine touch. Facial motor strength is symmetric and tongue and uvula move midline.  Motor exam:    Normal tone and normal muscle bulk and symmetric strength in all extremities.  He has used a walker until recently , now walks with a cane. He is in PT and made progress .  Sensory:  Fine touch, pinprick and vibration were tested in all extremities. Proprioception is normal.  Coordination: Rapid alternating movements in the fingers/hands is tested and normal. Finger-to-nose maneuver  normal without evidence of ataxia,or tremor.  Gait and station: Patient walks with a cane - assistive device  Stance is stable and normal. Romberg testing showed swaying , but he is not falling.   Deep tendon reflexes: in the  upper and lower extremities are symmetric and intact. Babinski maneuver response is downgoing.   Assessment:  After physical and neurologic examination, review of laboratory studies, imaging, neurophysiology testing and pre-existing records, assessment is   1) 3 spells of sudden lethargy, weakness delayed verbal response time. TIA or seizure? He was confused after each anesthesia with the recent surgeries. Early dementia , delirium ?.  2) nocturia will improve.  ,     diagnosed CSA / or complex sleep, not   OSA, no snoring. Responded only to adapt SV , not CPAP or BIPAP.  3) no acute cerebrovascular abnormalities. Imaging reviewed. Cardiac sinus arryhthmia : he wore a 30 day monitor without irregularities.   Carotid doppler normal for age.  Plan:  Treatment plan and additional workup :  I strongly support this patient with mild COPD and EF of 60 % to pursue adapt SV at the ordered settings. His mild cognitive impairment may improve.   I discussed Aricept.  I prefer to have the patient tested by [facility] /MMSE after 30- 60  days of adapt SV use.

## 2014-01-23 NOTE — Telephone Encounter (Signed)
error 

## 2014-02-21 ENCOUNTER — Ambulatory Visit: Payer: Medicare Other | Admitting: Neurology

## 2014-03-28 ENCOUNTER — Ambulatory Visit: Payer: Self-pay | Admitting: Internal Medicine

## 2014-06-28 ENCOUNTER — Encounter: Payer: Self-pay | Admitting: Gastroenterology

## 2014-09-03 ENCOUNTER — Encounter: Payer: Self-pay | Admitting: Podiatry

## 2014-09-03 ENCOUNTER — Ambulatory Visit (INDEPENDENT_AMBULATORY_CARE_PROVIDER_SITE_OTHER): Payer: Medicare Other | Admitting: Podiatry

## 2014-09-03 VITALS — BP 130/79 | HR 92 | Resp 16

## 2014-09-03 DIAGNOSIS — I739 Peripheral vascular disease, unspecified: Secondary | ICD-10-CM | POA: Diagnosis not present

## 2014-09-03 DIAGNOSIS — B351 Tinea unguium: Secondary | ICD-10-CM

## 2014-09-03 DIAGNOSIS — M79673 Pain in unspecified foot: Secondary | ICD-10-CM

## 2014-09-03 NOTE — Progress Notes (Signed)
   Subjective:    Patient ID: Derek Carr, male    DOB: 1931/12/10, 81 y.o.   MRN: [id number]  HPI Comments: "I need my feet checked"  Patient presents with daughter stating he needs his feet checked. She is concerned about trimming the toenails and calluses.      Review of Systems  Musculoskeletal: Positive for arthralgias and gait problem.  All other systems reviewed and are negative.      Objective:   Physical Exam: I have reviewed his past medical history medications allergies surgery social history review of systems chief complaint and statement. Pulses are palpable bilateral. Neurologic sensorium is intact per Semmes-Weinstein monofilament. Deep tendon reflexes intact bilateral. Muscle strength bilateral. With the evaluation shows HIS DISTAL LEG, FULL RANGE OF MOTION WITHOUT CREPITATION. NAILS ARE THICK YELLOW DYSTROPHIC ONYCHOMYCOTIC PAINFUL ON PALPATION.        Assessment & Plan:  Assessment: Pain in limb secondary to onychomycosis.  Plan: Debridement of toenails 1 through 5 bilateral foot secondary to pain. Follow-up within 3 months

## 2014-09-23 ENCOUNTER — Other Ambulatory Visit: Payer: Self-pay | Admitting: Internal Medicine

## 2014-09-23 DIAGNOSIS — R918 Other nonspecific abnormal finding of lung field: Secondary | ICD-10-CM

## 2014-09-26 ENCOUNTER — Ambulatory Visit
Admission: RE | Admit: 2014-09-26 | Discharge: 2014-09-26 | Disposition: A | Discharge status: Home / Self Care | Payer: Medicare Other | Source: Ambulatory Visit | Attending: Internal Medicine | Admitting: Internal Medicine

## 2014-09-26 DIAGNOSIS — R918 Other nonspecific abnormal finding of lung field: Secondary | ICD-10-CM

## 2014-09-26 MED ORDER — IOPAMIDOL (ISOVUE-300) INJECTION 61%
75.0000 mL | Freq: Once | INTRAVENOUS | Status: AC | PRN
  Administered 2014-09-26: 75 mL via INTRAVENOUS

## 2015-02-11 DIAGNOSIS — H401122 Primary open-angle glaucoma, left eye, moderate stage: Secondary | ICD-10-CM | POA: Diagnosis not present

## 2015-02-11 DIAGNOSIS — H401112 Primary open-angle glaucoma, right eye, moderate stage: Secondary | ICD-10-CM | POA: Diagnosis not present

## 2015-03-20 DIAGNOSIS — M810 Age-related osteoporosis without current pathological fracture: Secondary | ICD-10-CM | POA: Diagnosis not present

## 2015-03-20 DIAGNOSIS — Z6833 Body mass index (BMI) 33.0-33.9, adult: Secondary | ICD-10-CM | POA: Diagnosis not present

## 2015-03-20 DIAGNOSIS — J449 Chronic obstructive pulmonary disease, unspecified: Secondary | ICD-10-CM | POA: Diagnosis not present

## 2015-03-20 DIAGNOSIS — M1611 Unilateral primary osteoarthritis, right hip: Secondary | ICD-10-CM | POA: Diagnosis not present

## 2015-03-20 DIAGNOSIS — I714 Abdominal aortic aneurysm, without rupture: Secondary | ICD-10-CM | POA: Diagnosis not present

## 2015-03-20 DIAGNOSIS — C61 Malignant neoplasm of prostate: Secondary | ICD-10-CM | POA: Diagnosis not present

## 2015-03-28 ENCOUNTER — Other Ambulatory Visit (HOSPITAL_COMMUNITY): Payer: Medicare Other

## 2015-04-07 ENCOUNTER — Ambulatory Visit (HOSPITAL_COMMUNITY)
Admission: RE | Admit: 2015-04-07 | Discharge: 2015-04-07 | Disposition: A | Discharge status: Home / Self Care | Payer: Medicare HMO | Source: Ambulatory Visit | Attending: Surgery | Admitting: Surgery

## 2015-04-07 ENCOUNTER — Other Ambulatory Visit (HOSPITAL_COMMUNITY): Payer: Medicare Other

## 2015-04-07 ENCOUNTER — Other Ambulatory Visit (HOSPITAL_COMMUNITY): Payer: Self-pay | Admitting: Internal Medicine

## 2015-04-07 DIAGNOSIS — I714 Abdominal aortic aneurysm, without rupture, unspecified: Secondary | ICD-10-CM

## 2015-04-07 DIAGNOSIS — E785 Hyperlipidemia, unspecified: Secondary | ICD-10-CM | POA: Diagnosis not present

## 2015-04-07 DIAGNOSIS — I10 Essential (primary) hypertension: Secondary | ICD-10-CM | POA: Insufficient documentation

## 2015-06-10 DIAGNOSIS — B0233 Zoster keratitis: Secondary | ICD-10-CM | POA: Diagnosis not present

## 2015-06-13 DIAGNOSIS — B0233 Zoster keratitis: Secondary | ICD-10-CM | POA: Diagnosis not present

## 2015-06-20 DIAGNOSIS — B0233 Zoster keratitis: Secondary | ICD-10-CM | POA: Diagnosis not present

## 2015-06-20 DIAGNOSIS — H179 Unspecified corneal scar and opacity: Secondary | ICD-10-CM | POA: Diagnosis not present

## 2015-06-20 DIAGNOSIS — H401122 Primary open-angle glaucoma, left eye, moderate stage: Secondary | ICD-10-CM | POA: Diagnosis not present

## 2015-06-20 DIAGNOSIS — H401112 Primary open-angle glaucoma, right eye, moderate stage: Secondary | ICD-10-CM | POA: Diagnosis not present

## 2015-08-18 DIAGNOSIS — R351 Nocturia: Secondary | ICD-10-CM | POA: Diagnosis not present

## 2015-08-18 DIAGNOSIS — C61 Malignant neoplasm of prostate: Secondary | ICD-10-CM | POA: Diagnosis not present

## 2015-10-08 DIAGNOSIS — E559 Vitamin D deficiency, unspecified: Secondary | ICD-10-CM | POA: Diagnosis not present

## 2015-10-08 DIAGNOSIS — Z0000 Encounter for general adult medical examination without abnormal findings: Secondary | ICD-10-CM | POA: Diagnosis not present

## 2015-10-08 DIAGNOSIS — E784 Other hyperlipidemia: Secondary | ICD-10-CM | POA: Diagnosis not present

## 2015-10-08 DIAGNOSIS — Z125 Encounter for screening for malignant neoplasm of prostate: Secondary | ICD-10-CM | POA: Diagnosis not present

## 2015-10-13 DIAGNOSIS — Z87898 Personal history of other specified conditions: Secondary | ICD-10-CM | POA: Diagnosis not present

## 2015-10-13 DIAGNOSIS — Z0000 Encounter for general adult medical examination without abnormal findings: Secondary | ICD-10-CM | POA: Diagnosis not present

## 2015-10-13 DIAGNOSIS — J449 Chronic obstructive pulmonary disease, unspecified: Secondary | ICD-10-CM | POA: Diagnosis not present

## 2015-10-13 DIAGNOSIS — D6489 Other specified anemias: Secondary | ICD-10-CM | POA: Diagnosis not present

## 2015-10-13 DIAGNOSIS — R945 Abnormal results of liver function studies: Secondary | ICD-10-CM | POA: Diagnosis not present

## 2015-10-13 DIAGNOSIS — E041 Nontoxic single thyroid nodule: Secondary | ICD-10-CM | POA: Diagnosis not present

## 2015-10-13 DIAGNOSIS — I714 Abdominal aortic aneurysm, without rupture: Secondary | ICD-10-CM | POA: Diagnosis not present

## 2015-10-13 DIAGNOSIS — R808 Other proteinuria: Secondary | ICD-10-CM | POA: Diagnosis not present

## 2015-10-13 DIAGNOSIS — C61 Malignant neoplasm of prostate: Secondary | ICD-10-CM | POA: Diagnosis not present

## 2015-10-13 DIAGNOSIS — N281 Cyst of kidney, acquired: Secondary | ICD-10-CM | POA: Diagnosis not present

## 2015-10-13 DIAGNOSIS — Z23 Encounter for immunization: Secondary | ICD-10-CM | POA: Diagnosis not present

## 2015-11-11 DIAGNOSIS — H2513 Age-related nuclear cataract, bilateral: Secondary | ICD-10-CM | POA: Diagnosis not present

## 2015-11-11 DIAGNOSIS — R42 Dizziness and giddiness: Secondary | ICD-10-CM | POA: Diagnosis not present

## 2015-11-11 DIAGNOSIS — H25013 Cortical age-related cataract, bilateral: Secondary | ICD-10-CM | POA: Diagnosis not present

## 2015-11-11 DIAGNOSIS — Z6832 Body mass index (BMI) 32.0-32.9, adult: Secondary | ICD-10-CM | POA: Diagnosis not present

## 2015-11-11 DIAGNOSIS — H04123 Dry eye syndrome of bilateral lacrimal glands: Secondary | ICD-10-CM | POA: Diagnosis not present

## 2015-11-11 DIAGNOSIS — H401132 Primary open-angle glaucoma, bilateral, moderate stage: Secondary | ICD-10-CM | POA: Diagnosis not present

## 2015-11-11 DIAGNOSIS — R030 Elevated blood-pressure reading, without diagnosis of hypertension: Secondary | ICD-10-CM | POA: Diagnosis not present

## 2015-11-11 DIAGNOSIS — H538 Other visual disturbances: Secondary | ICD-10-CM | POA: Diagnosis not present

## 2015-11-11 DIAGNOSIS — B0233 Zoster keratitis: Secondary | ICD-10-CM | POA: Diagnosis not present

## 2016-01-02 DIAGNOSIS — H401112 Primary open-angle glaucoma, right eye, moderate stage: Secondary | ICD-10-CM | POA: Diagnosis not present

## 2016-01-02 DIAGNOSIS — H2513 Age-related nuclear cataract, bilateral: Secondary | ICD-10-CM | POA: Diagnosis not present

## 2016-01-02 DIAGNOSIS — H401122 Primary open-angle glaucoma, left eye, moderate stage: Secondary | ICD-10-CM | POA: Diagnosis not present

## 2016-01-02 DIAGNOSIS — H25013 Cortical age-related cataract, bilateral: Secondary | ICD-10-CM | POA: Diagnosis not present

## 2016-01-12 DIAGNOSIS — H25013 Cortical age-related cataract, bilateral: Secondary | ICD-10-CM | POA: Diagnosis not present

## 2016-01-12 DIAGNOSIS — H401122 Primary open-angle glaucoma, left eye, moderate stage: Secondary | ICD-10-CM | POA: Diagnosis not present

## 2016-01-12 DIAGNOSIS — H2513 Age-related nuclear cataract, bilateral: Secondary | ICD-10-CM | POA: Diagnosis not present

## 2016-01-12 DIAGNOSIS — H348322 Tributary (branch) retinal vein occlusion, left eye, stable: Secondary | ICD-10-CM | POA: Diagnosis not present

## 2016-01-12 DIAGNOSIS — H401112 Primary open-angle glaucoma, right eye, moderate stage: Secondary | ICD-10-CM | POA: Diagnosis not present

## 2016-01-15 ENCOUNTER — Encounter (INDEPENDENT_AMBULATORY_CARE_PROVIDER_SITE_OTHER): Payer: Medicare HMO | Admitting: Ophthalmology

## 2016-01-15 DIAGNOSIS — H35033 Hypertensive retinopathy, bilateral: Secondary | ICD-10-CM

## 2016-01-15 DIAGNOSIS — I10 Essential (primary) hypertension: Secondary | ICD-10-CM | POA: Diagnosis not present

## 2016-01-15 DIAGNOSIS — H35372 Puckering of macula, left eye: Secondary | ICD-10-CM | POA: Diagnosis not present

## 2016-01-15 DIAGNOSIS — H43813 Vitreous degeneration, bilateral: Secondary | ICD-10-CM | POA: Diagnosis not present

## 2016-01-15 DIAGNOSIS — H348322 Tributary (branch) retinal vein occlusion, left eye, stable: Secondary | ICD-10-CM | POA: Diagnosis not present

## 2016-01-15 DIAGNOSIS — H2513 Age-related nuclear cataract, bilateral: Secondary | ICD-10-CM

## 2016-01-29 DIAGNOSIS — Z125 Encounter for screening for malignant neoplasm of prostate: Secondary | ICD-10-CM | POA: Diagnosis not present

## 2016-01-30 ENCOUNTER — Other Ambulatory Visit: Payer: Self-pay | Admitting: Physician Assistant

## 2016-01-30 DIAGNOSIS — M25552 Pain in left hip: Secondary | ICD-10-CM | POA: Diagnosis not present

## 2016-01-30 DIAGNOSIS — M1612 Unilateral primary osteoarthritis, left hip: Secondary | ICD-10-CM

## 2016-01-30 DIAGNOSIS — M25551 Pain in right hip: Secondary | ICD-10-CM | POA: Diagnosis not present

## 2016-02-03 ENCOUNTER — Other Ambulatory Visit: Payer: Self-pay | Admitting: Orthopedic Surgery

## 2016-02-03 DIAGNOSIS — B0233 Zoster keratitis: Secondary | ICD-10-CM | POA: Diagnosis not present

## 2016-02-03 DIAGNOSIS — B0232 Zoster iridocyclitis: Secondary | ICD-10-CM | POA: Diagnosis not present

## 2016-02-03 DIAGNOSIS — M167 Other unilateral secondary osteoarthritis of hip: Secondary | ICD-10-CM

## 2016-02-05 DIAGNOSIS — Z8546 Personal history of malignant neoplasm of prostate: Secondary | ICD-10-CM | POA: Diagnosis not present

## 2016-02-05 DIAGNOSIS — N3941 Urge incontinence: Secondary | ICD-10-CM | POA: Diagnosis not present

## 2016-02-10 ENCOUNTER — Ambulatory Visit
Admission: RE | Admit: 2016-02-10 | Discharge: 2016-02-10 | Disposition: A | Discharge status: Home / Self Care | Payer: Medicare HMO | Source: Ambulatory Visit | Attending: Orthopedic Surgery | Admitting: Orthopedic Surgery

## 2016-02-10 DIAGNOSIS — M1612 Unilateral primary osteoarthritis, left hip: Secondary | ICD-10-CM | POA: Insufficient documentation

## 2016-02-10 DIAGNOSIS — M25552 Pain in left hip: Secondary | ICD-10-CM | POA: Diagnosis not present

## 2016-02-10 DIAGNOSIS — Z96641 Presence of right artificial hip joint: Secondary | ICD-10-CM | POA: Insufficient documentation

## 2016-02-10 DIAGNOSIS — M167 Other unilateral secondary osteoarthritis of hip: Secondary | ICD-10-CM

## 2016-02-11 ENCOUNTER — Emergency Department (HOSPITAL_COMMUNITY): Payer: Medicare HMO

## 2016-02-11 ENCOUNTER — Emergency Department (HOSPITAL_COMMUNITY)
Admission: EM | Admit: 2016-02-11 | Discharge: 2016-02-11 | Disposition: A | Discharge status: Home / Self Care | Payer: Medicare HMO | Attending: Emergency Medicine | Admitting: Emergency Medicine

## 2016-02-11 ENCOUNTER — Encounter (HOSPITAL_COMMUNITY): Payer: Self-pay | Admitting: Emergency Medicine

## 2016-02-11 DIAGNOSIS — R479 Unspecified speech disturbances: Secondary | ICD-10-CM

## 2016-02-11 DIAGNOSIS — Z7982 Long term (current) use of aspirin: Secondary | ICD-10-CM | POA: Diagnosis not present

## 2016-02-11 DIAGNOSIS — I10 Essential (primary) hypertension: Secondary | ICD-10-CM | POA: Insufficient documentation

## 2016-02-11 DIAGNOSIS — Z79899 Other long term (current) drug therapy: Secondary | ICD-10-CM | POA: Diagnosis not present

## 2016-02-11 DIAGNOSIS — R4781 Slurred speech: Secondary | ICD-10-CM | POA: Diagnosis not present

## 2016-02-11 DIAGNOSIS — Z87891 Personal history of nicotine dependence: Secondary | ICD-10-CM | POA: Diagnosis not present

## 2016-02-11 DIAGNOSIS — Z96641 Presence of right artificial hip joint: Secondary | ICD-10-CM | POA: Diagnosis not present

## 2016-02-11 DIAGNOSIS — R4701 Aphasia: Secondary | ICD-10-CM | POA: Diagnosis not present

## 2016-02-11 DIAGNOSIS — R791 Abnormal coagulation profile: Secondary | ICD-10-CM | POA: Insufficient documentation

## 2016-02-11 DIAGNOSIS — Z8546 Personal history of malignant neoplasm of prostate: Secondary | ICD-10-CM | POA: Insufficient documentation

## 2016-02-11 DIAGNOSIS — J449 Chronic obstructive pulmonary disease, unspecified: Secondary | ICD-10-CM | POA: Insufficient documentation

## 2016-02-11 HISTORY — DX: Nontoxic single thyroid nodule: E04.1

## 2016-02-11 LAB — I-STAT CHEM 8, ED
BUN: 19 mg/dL (ref 6–20)
CREATININE: 1.3 mg/dL — AB (ref 0.61–1.24)
Calcium, Ion: 1.03 mmol/L — ABNORMAL LOW (ref 1.15–1.40)
Chloride: 114 mmol/L — ABNORMAL HIGH (ref 101–111)
Glucose, Bld: 85 mg/dL (ref 65–99)
HEMATOCRIT: 36 % — AB (ref 39.0–52.0)
HEMOGLOBIN: 12.2 g/dL — AB (ref 13.0–17.0)
Potassium: 4.2 mmol/L (ref 3.5–5.1)
SODIUM: 143 mmol/L (ref 135–145)
TCO2: 16 mmol/L (ref 0–100)

## 2016-02-11 LAB — COMPREHENSIVE METABOLIC PANEL
ALBUMIN: 3.3 g/dL — AB (ref 3.5–5.0)
ALK PHOS: 70 U/L (ref 38–126)
ALT: 19 U/L (ref 17–63)
ANION GAP: 5 (ref 5–15)
AST: 19 U/L (ref 15–41)
BILIRUBIN TOTAL: 0.3 mg/dL (ref 0.3–1.2)
BUN: 17 mg/dL (ref 6–20)
CALCIUM: 9.3 mg/dL (ref 8.9–10.3)
CO2: 23 mmol/L (ref 22–32)
Chloride: 114 mmol/L — ABNORMAL HIGH (ref 101–111)
Creatinine, Ser: 1.26 mg/dL — ABNORMAL HIGH (ref 0.61–1.24)
GFR, EST AFRICAN AMERICAN: 60 mL/min — AB (ref >60)
GFR, EST NON AFRICAN AMERICAN: 51 mL/min — AB (ref >60)
GLUCOSE: 86 mg/dL (ref 65–99)
POTASSIUM: 3.8 mmol/L (ref 3.5–5.1)
Sodium: 142 mmol/L (ref 135–145)
TOTAL PROTEIN: 7.3 g/dL (ref 6.5–8.1)

## 2016-02-11 LAB — CBC
HEMATOCRIT: 35.7 % — AB (ref 39.0–52.0)
Hemoglobin: 11.8 g/dL — ABNORMAL LOW (ref 13.0–17.0)
MCH: 27.2 pg (ref 26.0–34.0)
MCHC: 33.1 g/dL (ref 30.0–36.0)
MCV: 82.3 fL (ref 78.0–100.0)
Platelets: 166 10*3/uL (ref 150–400)
RBC: 4.34 MIL/uL (ref 4.22–5.81)
RDW: 15.3 % (ref 11.5–15.5)
WBC: 2.6 10*3/uL — ABNORMAL LOW (ref 4.0–10.5)

## 2016-02-11 LAB — DIFFERENTIAL
BASOS ABS: 0 10*3/uL (ref 0.0–0.1)
Basophils Relative: 0 %
EOS PCT: 8 %
Eosinophils Absolute: 0.2 10*3/uL (ref 0.0–0.7)
LYMPHS ABS: 1.2 10*3/uL (ref 0.7–4.0)
Lymphocytes Relative: 44 %
MONO ABS: 0.3 10*3/uL (ref 0.1–1.0)
Monocytes Relative: 13 %
NEUTROS ABS: 0.9 10*3/uL — AB (ref 1.7–7.7)
Neutrophils Relative %: 35 %

## 2016-02-11 LAB — PROTIME-INR
INR: 0.99
Prothrombin Time: 13.1 seconds (ref 11.4–15.2)

## 2016-02-11 LAB — RAPID URINE DRUG SCREEN, HOSP PERFORMED
AMPHETAMINES: NOT DETECTED
BENZODIAZEPINES: NOT DETECTED
Barbiturates: NOT DETECTED
COCAINE: NOT DETECTED
OPIATES: NOT DETECTED
Tetrahydrocannabinol: NOT DETECTED

## 2016-02-11 LAB — URINALYSIS, ROUTINE W REFLEX MICROSCOPIC
BILIRUBIN URINE: NEGATIVE
Glucose, UA: NEGATIVE mg/dL
HGB URINE DIPSTICK: NEGATIVE
Ketones, ur: NEGATIVE mg/dL
Leukocytes, UA: NEGATIVE
Nitrite: NEGATIVE
PH: 5 (ref 5.0–8.0)
Protein, ur: NEGATIVE mg/dL
SPECIFIC GRAVITY, URINE: 1.018 (ref 1.005–1.030)

## 2016-02-11 LAB — APTT: APTT: 28 s (ref 24–36)

## 2016-02-11 NOTE — ED Triage Notes (Signed)
Pt is currently being treated for active shingles in his eye

## 2016-02-11 NOTE — ED Triage Notes (Signed)
PT brought in by daughter for slurred speech  Pt's daughter states the neighbor called her because when the pt came to visit yesterday they noticed his speech was garbled and slurred  Pt has no neuro deficits noted at this time with the exception of some words have a slight slur noted  Pt denies any complaints

## 2016-02-11 NOTE — ED Notes (Signed)
Pt able to ambulate in room with no difficulties.

## 2016-02-11 NOTE — Discharge Instructions (Signed)
Return as needed for recurrent symptoms, consider outpatient follow up with a neurologist for further evaluation

## 2016-02-11 NOTE — ED Provider Notes (Signed)
[facility] [place] [place] DEPT Provider Note   CSN: [id number] Arrival date & time: 02/11/16  1753  History   Chief Complaint Chief Complaint  Patient presents with  . Aphasia   HPI Derek Carr is a 83 y.o. male.  HPI  Patient to the ER with complaints of anemia, diverticulitis, glaucoma, hypertension, hyperlipidemia, hx of prostate cancer, currently being treat with shingles to the eyes.   Brought to the ER by his daughter with complaints of slurred speech. The daughter says that he went to visit a friend yesterday and he was slurring and garbling his speech. The friend did not call and tell her till today. She called his PCP who recommended he comes to the ER today.   She notes that over the past 4-6 months the PCP and Ortho have been evaluating some of his gait difficulties lately and he had a fall in November. The patient says he did not realize he was slurring yesterday and that his friend didn't say anything to him about it. Denies falls. He denies having any abnormal symptoms at this time. The daughter says her dad is currently at his baseline and she has not noted any acute abnormalities today since she has been interacting with him.  Past Medical History:  Diagnosis Date  . Anemia   . Atherosclerosis   . Complication of anesthesia    CONFUSION - Pt's family very concerned about this  . DDD (degenerative disc disease)   . Degenerative arthritis   . Dermatitis, atopic ARMS AND LEGS  . Diverticulosis   . Erectile dysfunction   . Frequency of urination   . Glaucoma   . History of asbestos exposure   . History of radiation therapy 8/19-13-10/18/11   prostate, 24 GY  . History of shingles 2012-- BILATERAL EYES--  NO RESIDUAL  . Hyperlipidemia   . Hypertension   . Inguinal hernia   . Lumbar scoliosis   . Nocturia   . OA (osteoarthritis) of hip RIGHT  . Prostate cancer ([place]) 05/11/2011   bx=Adenocarcinoma,gleason3+4=7, 4+4=8,PSA=10.30volume=45.7cc  . Renal cyst    bilateral    . Smokers' cough ([place])   . Thyroid cyst     Patient Active Problem List   Diagnosis Date Noted  . Dementia arising in the senium and presenium 01/07/2014  . CSA (central sleep apnea) 10/05/2013  . Syncope 06/26/2013  . COPD GOLD II 06/25/2013  . BPH (benign prostatic hyperplasia) 05/18/2013  . Glaucoma 05/18/2013  . S/P right TH revision 05/14/2013  . Constipation 03/26/2013  . Osteoporosis 03/26/2013  . Expected blood loss anemia 03/23/2013  . S/P right THA, AA 03/20/2013  . Hyperlipidemia   . Erectile dysfunction   . History of asbestos exposure   . Nocturia   . History of shingles   . Dermatitis, atopic   . OA (osteoarthritis) of hip   . Arthritis   . Frequency of urination   . Lumbar scoliosis   . Malignant neoplasm of prostate ([place]) 06/07/2011  . 83 year old gentleman with stage T3 adenocarcinoma prostate with Gleason score 4+4 and PSA of 10.3 05/11/2011    Past Surgical History:  Procedure Laterality Date  . ATTEMPTED LEFT VATS/ LEFT THORACOTOMY/ RESECTION OF THE ENORMOUS, PROBABLE BRONCHOGENIC CYST  01-04-2005  DR Arlyce Dice  . CARDIAC CATHETERIZATION  02-24-2009  DR NASHER   MINOR CORONARY ARTERY IRREGULARITIES/ NORMAL LVSF/ EF 65-70%  . COLONOSCOPY W/ POLYPECTOMY    . CYSTOSCOPY  11/15/2011   Procedure: CYSTOSCOPY;  Surgeon: Dutch Gray, MD;  Location:  [place];  Service: Urology;  Laterality: N/A;  no seeds seen in bladder  . esophageus cyst removal  YRS AGO  . [place]  . PROSTATE BIOPSY  05/11/11   Adenocarcinoma ([facility] OFFICE)  . RADIOACTIVE SEED IMPLANT  11/15/2011   Procedure: RADIOACTIVE SEED IMPLANT;  Surgeon: Dutch Gray, MD;  Location: [facility];  Service: Urology;  Laterality: N/A;  Total number of seeds - 52  . REPAIR RIGHT INGUINAL HERNIA W/ MESH  09-10-1999  . TOTAL HIP ARTHROPLASTY Right 03/20/2013   Procedure: RIGHT TOTAL HIP ARTHROPLASTY ANTERIOR APPROACH;  Surgeon: [name], MD;  [facility];  Service: Orthopedics;  Laterality: Right;  . TOTAL HIP REVISION Right 05/14/2013   Procedure: open reduction internal fixation REVISION RIGHT HIP ;  Surgeon: [name], MD;  [facility];  Service: Orthopedics;  Laterality: Right;  . UMBILICAL HERNIA REPAIR  1998   epigastric     Home Medications    Prior to Admission medications   Medication Sig Start Date End Date Taking? Authorizing Provider  acetaminophen (TYLENOL) 500 MG tablet Take 1,000 mg by mouth 2 (two) times daily.    Historical Provider, MD  aspirin 81 MG tablet Take 81 mg by mouth daily.    Historical Provider, MD  Calcium Carbonate Antacid (TUMS E-X PO) Take by mouth.    Historical Provider, MD  Cholecalciferol (VITAMIN D-3 PO) Take by mouth.    Historical Provider, MD  clobetasol cream (TEMOVATE) [id number] % Apply 1 application topically 2 (two) times daily. DO NOT USE ON THE FACE 08/26/13   Charlann Lange, PA-C  Cyanocobalamin (VITAMIN B12 PO) Take by mouth.    Historical Provider, MD  Docosahexaenoic Acid (DHA OMEGA 3 PO) Take 1,600 mg by mouth daily.    Historical Provider, MD  donepezil (ARICEPT) 5 MG tablet Take 1 tablet (5 mg total) by mouth at bedtime. 01/07/14   Asencion Partridge Dohmeier, MD  ezetimibe (ZETIA) 10 MG tablet Take 10 mg by mouth every morning.     Historical Provider, MD  folic acid (FOLVITE) 1 MG tablet Take 1 mg by mouth daily.    Historical Provider, MD  MYRBETRIQ 50 MG TB24 tablet Take 1 tablet by mouth daily.  06/28/13   Historical Provider, MD  Omega-3 Fatty Acids (FISH OIL PO) Take by mouth.    Historical Provider, MD  pravastatin (PRAVACHOL) 80 MG tablet Take 80 mg by mouth every evening.  08/17/10   Historical Provider, MD  Ubiquinol 100 MG CAPS Take 100 mg by mouth daily.    Historical Provider, MD  valACYclovir (VALTREX) 500 MG tablet Take 500 mg by mouth daily.     Historical Provider, MD   Family History Family History  Problem Relation Age of Onset  . Hypertension Mother   . Prostate cancer  Brother     surgery/cured  . Lung cancer Brother     new primary/same brother   Social History Social History  Substance Use Topics  . Smoking status: Former Smoker    Packs/day: 1.00    Years: 40.00    Types: Cigarettes    Quit date: 03/15/2012  . Smokeless tobacco: Never Used  . Alcohol use No   Allergies   Dilaudid [hydromorphone hcl]; Methocarbamol; Percodan [oxycodone-aspirin]; Tramadol; and Vicodin [hydrocodone-acetaminophen]   Review of Systems Review of Systems  Review of Systems All other systems negative except as documented in the HPI. All pertinent positives and negatives as  reviewed in the HPI.  Physical Exam Updated Vital Signs BP (!) 155/103 (BP Location: Left Arm)   Pulse 62   Temp 97.6 F (36.4 C) (Oral)   Resp 19   Ht 6\' 3"  (1.905 m)   Wt 81.6 kg   SpO2 97%   BMI 22.50 kg/m   Physical Exam  Constitutional: He appears well-developed and well-nourished. No distress.  HENT:  Head: Normocephalic and atraumatic.  Right Ear: Tympanic membrane and ear canal normal.  Left Ear: Tympanic membrane and ear canal normal.  Nose: Nose normal.  Mouth/Throat: Uvula is midline, oropharynx is clear and moist and mucous membranes are normal.  Eyes: Pupils are equal, round, and reactive to light.  Patient has swelling and healing herpetic lesions to right eye  Neck: Normal range of motion. Neck supple.  Cardiovascular: Normal rate and regular rhythm.   Pulmonary/Chest: Effort normal.  Abdominal: Soft.  No signs of abdominal distention  Musculoskeletal:  No LE swelling  Neurological: He is alert.  Acting at baseline Cranial nerves grossly intact on exam. Pt alert and oriented x 3 Upper and lower extremity strength is symmetrical and physiologic Normal muscular tone No facial droop Coordination intact, no limb ataxia,No pronator drift   Skin: Skin is warm and dry. No rash noted.  Nursing note and vitals reviewed.    ED Treatments / Results  Labs (all  labs ordered are listed, but only abnormal results are displayed) Labs Reviewed - No data to display  EKG  EKG Interpretation None       Radiology Mr Hip Left Wo Contrast  Result Date: 02/10/2016 CLINICAL DATA:  Left hip pain and giving way for 3-4 months. EXAM: MR OF THE LEFT HIP WITHOUT CONTRAST TECHNIQUE: Multiplanar, multisequence MR imaging was performed. No intravenous contrast was administered. COMPARISON:  CT pelvis 05/26/2011. Single-view of the pelvis 05/14/2013. FINDINGS: Bones: The patient is status post right hip replacement. Osteophytosis, subchondral cyst formation and edema are present about the left hip. No fracture or avascular necrosis. Heterogeneous marrow signal lower lumbar spine is noted. No worrisome marrow lesion. Articular cartilage and labrum Articular cartilage:  Thinned throughout. Labrum:  Degenerated without tear. Joint or bursal effusion Joint effusion:  None. Bursae:  Negative. Muscles and tendons Muscles and tendons:  Intact. Other findings Miscellaneous:  Imaged intrapelvic contents are unremarkable.   IMPRESSION: Advanced left hip osteoarthritis.  No acute abnormality. Status post right hip replacement. Electronically Signed   By: Inge Rise M.D.   On: 02/10/2016 09:52    Procedures Procedures (including critical care time)  Medications Ordered in ED Medications - No data to display   Initial Impression / Assessment and Plan / ED Course  I have reviewed the triage vital signs and the nursing notes.  Pertinent labs & imaging results that were available during my care of the patient were reviewed by me and considered in my medical decision making (see chart for details).  Clinical Course    6:53 pm Discussed case with Dr. Hillard Danker. Will initiate TIA work-up. It isn't clear if patient did have slurred/garbled speech or not yesterday. Daughter didn't see it and patient didn't realize he had any episodes of slurred speech yesterday.  8:28 pm-  patients head CT is negative. Still waiting for all of labwork to result. Dr. Tomi Bamberger, J. Is going to follow-up on urinalysis and disposition the patient. Patient and daughter updated on head CT and need for urine.  Final Clinical Impressions(s) / ED Diagnoses   Final  diagnoses:  None    New Prescriptions New Prescriptions   No medications on file     Earney Navy 02/11/16 2029    Dorie Rank, MD 02/11/16 2302

## 2016-02-11 NOTE — ED Notes (Signed)
Pt son and daughter is at bedside. Pt was at a friends house yesterday and noticed that pt was having slurred speech, pt dtr reports that she was not made aware until today, and that the pt is talking at his baseline at this time, Pt also had a fall that they were unaware of in Nov. Family wants pt to be evaluated.

## 2016-02-11 NOTE — ED Provider Notes (Signed)
Medical screening examination/treatment/procedure(s) were conducted as a shared visit with non-physician practitioner(s) and myself.  I personally evaluated the patient during the encounter.   EKG Interpretation  Date/Time:  Wednesday February 11 2016 18:48:48 EST Ventricular Rate:  57 PR Interval:    QRS Duration: 151 QT Interval:  451 QTC Calculation: 440 R Axis:   -53 Text Interpretation:  Sinus rhythm Right bundle branch block LVH with IVCD and secondary repol abnrm Baseline wander in lead(s) II aVF V2 No significant change since last tracing Confirmed by Taylorann Tkach  MD-J, Pepper Wyndham [id number]) on 02/11/2016 10:03:59 PM      Pt presented to the ED with complaints of difficulty speaking noticed yesterday.  Sx all resolved today.  Pt denies any complaints.  On my exam, no findings noted.  Pt is able to walk around without difficulty.  Labs show a stable anemia.  Decreased WBC, but this is a known issue.  Plan on outpatient follow up with neurology   Dorie Rank, MD 02/11/16 2219

## 2016-02-11 NOTE — ED Notes (Signed)
Patient transported to CT 

## 2016-02-12 LAB — PATHOLOGIST SMEAR REVIEW

## 2016-02-13 DIAGNOSIS — B0233 Zoster keratitis: Secondary | ICD-10-CM | POA: Diagnosis not present

## 2016-02-13 DIAGNOSIS — B0232 Zoster iridocyclitis: Secondary | ICD-10-CM | POA: Diagnosis not present

## 2016-02-13 DIAGNOSIS — H401112 Primary open-angle glaucoma, right eye, moderate stage: Secondary | ICD-10-CM | POA: Diagnosis not present

## 2016-02-13 DIAGNOSIS — H401122 Primary open-angle glaucoma, left eye, moderate stage: Secondary | ICD-10-CM | POA: Diagnosis not present

## 2016-02-19 DIAGNOSIS — M810 Age-related osteoporosis without current pathological fracture: Secondary | ICD-10-CM | POA: Diagnosis not present

## 2016-03-04 DIAGNOSIS — B0232 Zoster iridocyclitis: Secondary | ICD-10-CM | POA: Diagnosis not present

## 2016-03-04 DIAGNOSIS — H401122 Primary open-angle glaucoma, left eye, moderate stage: Secondary | ICD-10-CM | POA: Diagnosis not present

## 2016-03-04 DIAGNOSIS — B0233 Zoster keratitis: Secondary | ICD-10-CM | POA: Diagnosis not present

## 2016-03-04 DIAGNOSIS — H401112 Primary open-angle glaucoma, right eye, moderate stage: Secondary | ICD-10-CM | POA: Diagnosis not present

## 2016-03-09 DIAGNOSIS — M791 Myalgia: Secondary | ICD-10-CM | POA: Diagnosis not present

## 2016-04-13 ENCOUNTER — Other Ambulatory Visit (HOSPITAL_COMMUNITY): Payer: Self-pay | Admitting: Internal Medicine

## 2016-04-13 DIAGNOSIS — M1611 Unilateral primary osteoarthritis, right hip: Secondary | ICD-10-CM | POA: Diagnosis not present

## 2016-04-13 DIAGNOSIS — I10 Essential (primary) hypertension: Secondary | ICD-10-CM | POA: Diagnosis not present

## 2016-04-13 DIAGNOSIS — Z1389 Encounter for screening for other disorder: Secondary | ICD-10-CM | POA: Diagnosis not present

## 2016-04-13 DIAGNOSIS — G459 Transient cerebral ischemic attack, unspecified: Secondary | ICD-10-CM

## 2016-04-13 DIAGNOSIS — Z6832 Body mass index (BMI) 32.0-32.9, adult: Secondary | ICD-10-CM | POA: Diagnosis not present

## 2016-04-13 DIAGNOSIS — J449 Chronic obstructive pulmonary disease, unspecified: Secondary | ICD-10-CM | POA: Diagnosis not present

## 2016-04-13 DIAGNOSIS — G458 Other transient cerebral ischemic attacks and related syndromes: Secondary | ICD-10-CM | POA: Diagnosis not present

## 2016-04-15 ENCOUNTER — Other Ambulatory Visit (HOSPITAL_COMMUNITY): Payer: Self-pay | Admitting: Internal Medicine

## 2016-04-15 ENCOUNTER — Inpatient Hospital Stay (HOSPITAL_COMMUNITY): Admission: RE | Admit: 2016-04-15 | Payer: Medicare HMO | Source: Ambulatory Visit

## 2016-04-15 ENCOUNTER — Ambulatory Visit (HOSPITAL_COMMUNITY)
Admission: RE | Admit: 2016-04-15 | Discharge: 2016-04-15 | Disposition: A | Discharge status: Home / Self Care | Payer: Medicare HMO | Source: Ambulatory Visit | Attending: Internal Medicine | Admitting: Internal Medicine

## 2016-04-15 DIAGNOSIS — M2548 Effusion, other site: Secondary | ICD-10-CM | POA: Diagnosis not present

## 2016-04-15 DIAGNOSIS — M1612 Unilateral primary osteoarthritis, left hip: Secondary | ICD-10-CM | POA: Diagnosis not present

## 2016-04-15 DIAGNOSIS — G459 Transient cerebral ischemic attack, unspecified: Secondary | ICD-10-CM | POA: Insufficient documentation

## 2016-04-15 DIAGNOSIS — R4781 Slurred speech: Secondary | ICD-10-CM | POA: Diagnosis not present

## 2016-04-15 LAB — POCT I-STAT CREATININE: Creatinine, Ser: 1.2 mg/dL (ref 0.61–1.24)

## 2016-04-15 MED ORDER — GADOBENATE DIMEGLUMINE 529 MG/ML IV SOLN
20.0000 mL | Freq: Once | INTRAVENOUS | Status: AC | PRN
  Administered 2016-04-15: 20 mL via INTRAVENOUS

## 2016-04-16 ENCOUNTER — Encounter (HOSPITAL_COMMUNITY): Payer: Medicare HMO

## 2016-04-19 ENCOUNTER — Other Ambulatory Visit (HOSPITAL_COMMUNITY): Payer: Self-pay | Admitting: Internal Medicine

## 2016-04-19 ENCOUNTER — Ambulatory Visit (HOSPITAL_COMMUNITY)
Admission: RE | Admit: 2016-04-19 | Discharge: 2016-04-19 | Disposition: A | Discharge status: Home / Self Care | Payer: Medicare HMO | Source: Ambulatory Visit | Attending: Surgery | Admitting: Surgery

## 2016-04-19 DIAGNOSIS — G459 Transient cerebral ischemic attack, unspecified: Secondary | ICD-10-CM

## 2016-05-11 DIAGNOSIS — M1612 Unilateral primary osteoarthritis, left hip: Secondary | ICD-10-CM | POA: Diagnosis not present

## 2016-05-17 ENCOUNTER — Ambulatory Visit (INDEPENDENT_AMBULATORY_CARE_PROVIDER_SITE_OTHER): Payer: Medicare HMO | Admitting: Ophthalmology

## 2016-05-17 DIAGNOSIS — H2513 Age-related nuclear cataract, bilateral: Secondary | ICD-10-CM | POA: Diagnosis not present

## 2016-05-17 DIAGNOSIS — H43813 Vitreous degeneration, bilateral: Secondary | ICD-10-CM

## 2016-05-17 DIAGNOSIS — H34832 Tributary (branch) retinal vein occlusion, left eye, with macular edema: Secondary | ICD-10-CM | POA: Diagnosis not present

## 2016-05-17 DIAGNOSIS — I10 Essential (primary) hypertension: Secondary | ICD-10-CM | POA: Diagnosis not present

## 2016-05-17 DIAGNOSIS — H35033 Hypertensive retinopathy, bilateral: Secondary | ICD-10-CM | POA: Diagnosis not present

## 2016-05-26 DIAGNOSIS — M25552 Pain in left hip: Secondary | ICD-10-CM | POA: Diagnosis not present

## 2016-05-26 DIAGNOSIS — M25562 Pain in left knee: Secondary | ICD-10-CM | POA: Diagnosis not present

## 2016-05-26 DIAGNOSIS — Z9181 History of falling: Secondary | ICD-10-CM | POA: Diagnosis not present

## 2016-05-28 ENCOUNTER — Telehealth: Payer: Self-pay | Admitting: Internal Medicine

## 2016-05-28 NOTE — Telephone Encounter (Signed)
Called and spoke with pts daughter and she stated that the pt will be having surgery and will need surgical clearance.  He will be having a total hip replacement.    pts daughter stated that her dad has been having increased SOB with activity.  appt has been scheduled and she is aware.

## 2016-06-03 DIAGNOSIS — H35033 Hypertensive retinopathy, bilateral: Secondary | ICD-10-CM | POA: Diagnosis not present

## 2016-06-03 DIAGNOSIS — H348322 Tributary (branch) retinal vein occlusion, left eye, stable: Secondary | ICD-10-CM | POA: Diagnosis not present

## 2016-06-03 DIAGNOSIS — H401132 Primary open-angle glaucoma, bilateral, moderate stage: Secondary | ICD-10-CM | POA: Diagnosis not present

## 2016-06-03 DIAGNOSIS — H3562 Retinal hemorrhage, left eye: Secondary | ICD-10-CM | POA: Diagnosis not present

## 2016-06-15 ENCOUNTER — Encounter: Payer: Self-pay | Admitting: Internal Medicine

## 2016-06-15 ENCOUNTER — Ambulatory Visit (INDEPENDENT_AMBULATORY_CARE_PROVIDER_SITE_OTHER): Payer: Medicare HMO | Admitting: Internal Medicine

## 2016-06-15 ENCOUNTER — Ambulatory Visit (INDEPENDENT_AMBULATORY_CARE_PROVIDER_SITE_OTHER)
Admission: RE | Admit: 2016-06-15 | Discharge: 2016-06-15 | Disposition: A | Discharge status: Home / Self Care | Payer: Medicare HMO | Source: Ambulatory Visit | Attending: Internal Medicine | Admitting: Internal Medicine

## 2016-06-15 VITALS — BP 106/70 | HR 79 | Ht 75.0 in | Wt 181.0 lb

## 2016-06-15 DIAGNOSIS — J984 Other disorders of lung: Secondary | ICD-10-CM | POA: Diagnosis not present

## 2016-06-15 DIAGNOSIS — R0609 Other forms of dyspnea: Secondary | ICD-10-CM

## 2016-06-15 DIAGNOSIS — F17210 Nicotine dependence, cigarettes, uncomplicated: Secondary | ICD-10-CM

## 2016-06-15 DIAGNOSIS — J449 Chronic obstructive pulmonary disease, unspecified: Secondary | ICD-10-CM

## 2016-06-15 DIAGNOSIS — R0602 Shortness of breath: Secondary | ICD-10-CM | POA: Insufficient documentation

## 2016-06-15 DIAGNOSIS — R69 Illness, unspecified: Secondary | ICD-10-CM | POA: Diagnosis not present

## 2016-06-15 NOTE — Patient Instructions (Signed)
Please remember to go to the  x-ray department downstairs in the basement  for your tests - we will call you with the results when they are available.   You mainly need clean air to breathe at this point - anoro can be added back if you find you are limited by shortness of breath

## 2016-06-15 NOTE — Progress Notes (Signed)
Subjective:     Patient ID: Derek Carr, male   DOB: April 23, 1931   MRN: [id number]    Brief patient profile:  42 yobm active smoker  and not limited by sob at that point with having spells of hypersomnolence since orthopedic surgery R hip replaced  referred by Dr Joylene Draft to pulmonary clinic 06/25/2013  for ? Hypercarbia contributing with GOLD II criteri 06/2013 confirmed / unchanged 06/16/2016    History of Present Illness  06/25/2013 1st [facility] office visit/ Derek Carr  3 spells x 2 months lasting 5 min at most then resolve s sequelae assoc with low bp per Derek Carr daugher/CCU nurse - better when lie down and get both legs up so stopped flomax since last episode one week prior to OV  And no spells since stopped the flomax (which he says didn't help his urination anyway)  - occur sitting up assoc with drowsiness first  - never needs saba to break a spell or aware of sob before or after a spell.  rec I would not start the flomax back if you don't think it helped you urinate Check blood pressure sitting and standing daily and let Dr Joylene Draft know if it's dropping more than 20 points systolic  You have GOLD II COPD Mild/moderate severity and will never progress unless you resume smoking so stick with it!     06/15/2016  f/u ov/Derek Carr re: GOLD II copd / preop eval  Chief Complaint  Patient presents with  . Pulmonary Consult    Self referral for total left hip replacement.  He states he has SOB only when he walks long distances. He states able to do the things he wants without his breathing stopping him.   sleeps flat / no am flares of cough / congestion  He denies limiting sob/ did not use anoro from dr Joylene Draft enough to know whether it helped but mostly limited by L hip   No obvious day to day or daytime variability or assoc excess/ purulent sputum or mucus plugs or hemoptysis or cp or chest tightness, subjective wheeze or overt sinus or hb symptoms. No unusual exp hx or h/o childhood pna/ asthma or  knowledge of premature birth.  Sleeping ok without nocturnal  or early am exacerbation  of respiratory  c/o's or need for noct saba. Also denies any obvious fluctuation of symptoms with weather or environmental changes or other aggravating or alleviating factors except as outlined above   Current Medications, Allergies, Complete Past Medical History, Past Surgical History, Family History, and Social History were reviewed in [employer] record.  ROS  The following are not active complaints unless bolded sore throat, dysphagia, dental problems, itching, sneezing,  nasal congestion or excess/ purulent secretions, ear ache,   fever, chills, sweats, unintended wt loss, classically pleuritic or exertional cp,  orthopnea pnd or leg swelling, presyncope, palpitations, abdominal pain, anorexia, nausea, vomiting, diarrhea  or change in bowel or bladder habits, change in stools or urine, dysuria,hematuria,  rash, arthralgias, visual complaints, headache, numbness, weakness or ataxia or problems with walking or coordination,  change in mood/affect or memory.                      Objective:   Physical Exam   Stoic bm nad - lets his daughter Derek Mylar do most of the talking    06/15/2016      181   06/25/13 182 lb (82.555 kg)  05/30/13 178 lb 9.6 oz (81.012  kg)  05/18/13 176 lb (79.833 kg)      HEENT: top dentures, nl turbinates, and orophanx. Nl external ear canals without cough reflex   NECK :  without JVD/Nodes/TM/ nl carotid upstrokes bilaterally   LUNGS: no acc muscle use, slt barrel shaped with minimal insp and exp rhonchi bilaterally    CV:  RRR  no s3 or murmur or increase in P2, no edema   ABD:  soft and nontender with nl excursion in the supine position. No bruits or organomegaly, bowel sounds nl  MS:  warm without deformities, calf tenderness, cyanosis or clubbing  SKIN: warm and dry without lesions    NEURO:  alert, approp, no deficits      CXR PA  and Lateral:   06/15/2016 :    I personally reviewed images and agree with radiology impression as follows:    chronic elevation of L HD vs R, no acute changes    HCO3  02/11/16  =  23    .   Assessment:

## 2016-06-16 DIAGNOSIS — F17210 Nicotine dependence, cigarettes, uncomplicated: Secondary | ICD-10-CM | POA: Insufficient documentation

## 2016-06-16 NOTE — Assessment & Plan Note (Signed)

## 2016-06-16 NOTE — Assessment & Plan Note (Signed)
-   spirometry 06/25/13  FEV1  2.17 (74%) ratio 67  - Spirometry 06/15/2016  FEV1 2.11 (65%)  Ratio 69    Despite active smoking he has no change on spirometry, he is not hypercarbic or having aecopd  and not limited by sob but rather by R hip pain so :  1) no indication for maint rx other than clean air (see cig smoking sep a/p)  2) ok for hip surgery/ may benefit periop with duoneb qid   3)  should he find that after hip surgery he is consistently limited from desired activities by sob, then rechallenge with anoro would be approp >  Follow up per Primary Care planned   4) pulmonary f/u as inpt (at [facility]) and as outpt (here) is prn   I had an extended discussion with the patient reviewing all relevant studies completed to date and  lasting 25 minutes of a 40  minute pre-op visit to re-establish at daughter's request re clearance for sugergy  Discussed in detail all the  indications, usual  risks and alternatives  relative to the benefits with patient who agrees to proceed with surgery at [facility] as planned     Each maintenance medication was reviewed in detail including most importantly the difference between maintenance and prns and under what circumstances the prns are to be triggered using an action plan format that is not reflected in the computer generated alphabetically organized AVS.    Please see AVS for specific instructions unique to this office visit that I personally wrote and verbalized to the the pt in detail and then reviewed with pt  by my nurse highlighting any changes in therapy/plan of care  recommended at today's visit.

## 2016-06-16 NOTE — Progress Notes (Signed)
LMTCB

## 2016-06-23 ENCOUNTER — Telehealth: Payer: Self-pay | Admitting: Internal Medicine

## 2016-06-23 NOTE — Telephone Encounter (Signed)
Notes recorded by Tanda Rockers, MD on 06/16/2016 at 11:11 AM EDT Call pt: Reviewed cxr and no acute change so no change in recommendations made at [facility] with Robin. She verbalized understanding. Nothing further needed.

## 2016-06-24 DIAGNOSIS — G8929 Other chronic pain: Secondary | ICD-10-CM | POA: Diagnosis not present

## 2016-06-24 DIAGNOSIS — M545 Low back pain: Secondary | ICD-10-CM | POA: Diagnosis not present

## 2016-06-24 DIAGNOSIS — M25552 Pain in left hip: Secondary | ICD-10-CM | POA: Diagnosis not present

## 2016-06-24 DIAGNOSIS — R2681 Unsteadiness on feet: Secondary | ICD-10-CM | POA: Diagnosis not present

## 2016-06-24 DIAGNOSIS — Z96641 Presence of right artificial hip joint: Secondary | ICD-10-CM | POA: Diagnosis not present

## 2016-06-30 ENCOUNTER — Other Ambulatory Visit (HOSPITAL_COMMUNITY): Payer: Self-pay | Admitting: Orthopedic Surgery

## 2016-06-30 DIAGNOSIS — M5410 Radiculopathy, site unspecified: Secondary | ICD-10-CM

## 2016-07-03 ENCOUNTER — Ambulatory Visit (HOSPITAL_COMMUNITY): Admission: RE | Admit: 2016-07-03 | Payer: Medicare HMO | Source: Ambulatory Visit

## 2016-07-06 ENCOUNTER — Ambulatory Visit (HOSPITAL_COMMUNITY): Admission: RE | Admit: 2016-07-06 | Payer: Medicare HMO | Source: Ambulatory Visit

## 2016-07-07 DIAGNOSIS — Z8546 Personal history of malignant neoplasm of prostate: Secondary | ICD-10-CM | POA: Diagnosis not present

## 2016-07-07 DIAGNOSIS — N3941 Urge incontinence: Secondary | ICD-10-CM | POA: Diagnosis not present

## 2016-07-10 ENCOUNTER — Ambulatory Visit (HOSPITAL_COMMUNITY): Admission: RE | Admit: 2016-07-10 | Payer: Medicare HMO | Source: Ambulatory Visit

## 2016-07-10 ENCOUNTER — Ambulatory Visit (HOSPITAL_COMMUNITY)
Admission: RE | Admit: 2016-07-10 | Discharge: 2016-07-10 | Disposition: A | Discharge status: Home / Self Care | Payer: Medicare HMO | Source: Ambulatory Visit | Attending: Orthopedic Surgery | Admitting: Orthopedic Surgery

## 2016-07-10 DIAGNOSIS — M5126 Other intervertebral disc displacement, lumbar region: Secondary | ICD-10-CM | POA: Diagnosis not present

## 2016-07-10 DIAGNOSIS — M47896 Other spondylosis, lumbar region: Secondary | ICD-10-CM | POA: Insufficient documentation

## 2016-07-10 DIAGNOSIS — R2681 Unsteadiness on feet: Secondary | ICD-10-CM | POA: Diagnosis not present

## 2016-07-10 DIAGNOSIS — M48061 Spinal stenosis, lumbar region without neurogenic claudication: Secondary | ICD-10-CM | POA: Diagnosis not present

## 2016-07-10 DIAGNOSIS — N281 Cyst of kidney, acquired: Secondary | ICD-10-CM | POA: Insufficient documentation

## 2016-07-10 DIAGNOSIS — M5410 Radiculopathy, site unspecified: Secondary | ICD-10-CM

## 2016-07-10 DIAGNOSIS — R531 Weakness: Secondary | ICD-10-CM | POA: Insufficient documentation

## 2016-07-15 ENCOUNTER — Ambulatory Visit (HOSPITAL_COMMUNITY): Payer: Medicare HMO

## 2016-07-24 DIAGNOSIS — J449 Chronic obstructive pulmonary disease, unspecified: Secondary | ICD-10-CM | POA: Diagnosis not present

## 2016-07-24 DIAGNOSIS — I10 Essential (primary) hypertension: Secondary | ICD-10-CM | POA: Diagnosis not present

## 2016-07-24 DIAGNOSIS — J340 Abscess, furuncle and carbuncle of nose: Secondary | ICD-10-CM | POA: Diagnosis not present

## 2016-07-24 DIAGNOSIS — J301 Allergic rhinitis due to pollen: Secondary | ICD-10-CM | POA: Diagnosis not present

## 2016-08-02 DIAGNOSIS — Z6831 Body mass index (BMI) 31.0-31.9, adult: Secondary | ICD-10-CM | POA: Diagnosis not present

## 2016-08-02 DIAGNOSIS — B029 Zoster without complications: Secondary | ICD-10-CM | POA: Diagnosis not present

## 2016-08-12 DIAGNOSIS — L519 Erythema multiforme, unspecified: Secondary | ICD-10-CM | POA: Diagnosis not present

## 2016-08-12 DIAGNOSIS — L518 Other erythema multiforme: Secondary | ICD-10-CM | POA: Diagnosis not present

## 2016-08-30 DIAGNOSIS — R2689 Other abnormalities of gait and mobility: Secondary | ICD-10-CM | POA: Diagnosis not present

## 2016-08-30 DIAGNOSIS — R202 Paresthesia of skin: Secondary | ICD-10-CM | POA: Diagnosis not present

## 2016-08-30 DIAGNOSIS — G4733 Obstructive sleep apnea (adult) (pediatric): Secondary | ICD-10-CM | POA: Diagnosis not present

## 2016-08-30 DIAGNOSIS — M810 Age-related osteoporosis without current pathological fracture: Secondary | ICD-10-CM | POA: Diagnosis not present

## 2016-08-30 DIAGNOSIS — R200 Anesthesia of skin: Secondary | ICD-10-CM | POA: Diagnosis not present

## 2016-08-30 DIAGNOSIS — R269 Unspecified abnormalities of gait and mobility: Secondary | ICD-10-CM | POA: Diagnosis not present

## 2016-09-01 DIAGNOSIS — L518 Other erythema multiforme: Secondary | ICD-10-CM | POA: Diagnosis not present

## 2016-09-02 ENCOUNTER — Other Ambulatory Visit: Payer: Self-pay | Admitting: Nurse Practitioner

## 2016-09-02 DIAGNOSIS — R269 Unspecified abnormalities of gait and mobility: Secondary | ICD-10-CM

## 2016-09-07 ENCOUNTER — Ambulatory Visit
Admission: RE | Admit: 2016-09-07 | Discharge: 2016-09-07 | Disposition: A | Discharge status: Home / Self Care | Payer: Medicare HMO | Source: Ambulatory Visit | Attending: Nurse Practitioner | Admitting: Nurse Practitioner

## 2016-09-07 DIAGNOSIS — R2689 Other abnormalities of gait and mobility: Secondary | ICD-10-CM | POA: Diagnosis not present

## 2016-09-07 DIAGNOSIS — R269 Unspecified abnormalities of gait and mobility: Secondary | ICD-10-CM | POA: Insufficient documentation

## 2016-09-07 DIAGNOSIS — M47812 Spondylosis without myelopathy or radiculopathy, cervical region: Secondary | ICD-10-CM | POA: Diagnosis not present

## 2016-09-07 DIAGNOSIS — M4802 Spinal stenosis, cervical region: Secondary | ICD-10-CM | POA: Diagnosis not present

## 2016-09-13 ENCOUNTER — Ambulatory Visit: Payer: Medicare HMO | Admitting: Physical Therapy

## 2016-09-14 ENCOUNTER — Other Ambulatory Visit: Payer: Self-pay | Admitting: Internal Medicine

## 2016-09-14 DIAGNOSIS — M898X5 Other specified disorders of bone, thigh: Secondary | ICD-10-CM

## 2016-09-14 DIAGNOSIS — I10 Essential (primary) hypertension: Secondary | ICD-10-CM | POA: Diagnosis not present

## 2016-09-14 DIAGNOSIS — Z6829 Body mass index (BMI) 29.0-29.9, adult: Secondary | ICD-10-CM | POA: Diagnosis not present

## 2016-09-14 DIAGNOSIS — M5136 Other intervertebral disc degeneration, lumbar region: Secondary | ICD-10-CM | POA: Diagnosis not present

## 2016-09-14 DIAGNOSIS — M79659 Pain in unspecified thigh: Secondary | ICD-10-CM | POA: Diagnosis not present

## 2016-09-15 DIAGNOSIS — M25552 Pain in left hip: Secondary | ICD-10-CM | POA: Diagnosis not present

## 2016-09-15 DIAGNOSIS — M79605 Pain in left leg: Secondary | ICD-10-CM | POA: Diagnosis not present

## 2016-09-17 DIAGNOSIS — G608 Other hereditary and idiopathic neuropathies: Secondary | ICD-10-CM | POA: Diagnosis not present

## 2016-09-20 ENCOUNTER — Ambulatory Visit: Payer: Medicare HMO | Attending: Neurology | Admitting: Physical Therapy

## 2016-09-20 ENCOUNTER — Encounter: Payer: Self-pay | Admitting: Physical Therapy

## 2016-09-20 DIAGNOSIS — M79605 Pain in left leg: Secondary | ICD-10-CM | POA: Diagnosis present

## 2016-09-20 DIAGNOSIS — R262 Difficulty in walking, not elsewhere classified: Secondary | ICD-10-CM | POA: Diagnosis not present

## 2016-09-20 DIAGNOSIS — M6281 Muscle weakness (generalized): Secondary | ICD-10-CM

## 2016-09-20 NOTE — Therapy (Addendum)
[place] MAIN [facility] SERVICES [address] [place], [place], [address] Phone: [phone]   Fax:  [phone]  Physical Therapy Evaluation  Patient Details  Name: CAROLD EISNER MRN: [id number] Date of Birth: [birth year] Referring Provider: Vladimir Crofts   Encounter Date: 09/20/2016      PT End of Session - 09/20/16 1711    Visit Number 1   Number of Visits 17   Date for PT Re-Evaluation 11/15/16   Authorization Type 1/10 g codes   PT Start Time 0445   PT Stop Time 0545   PT Time Calculation (min) 60 min   Equipment Utilized During Treatment Gait belt   Activity Tolerance Patient tolerated treatment well;Patient limited by fatigue;Patient limited by pain   Behavior During Therapy [facility] for tasks assessed/performed      Past Medical History:  Diagnosis Date  . Anemia   . Atherosclerosis   . Complication of anesthesia    CONFUSION - Pt's family very concerned about this  . DDD (degenerative disc disease)   . Degenerative arthritis   . Dermatitis, atopic ARMS AND LEGS  . Diverticulosis   . Erectile dysfunction   . Frequency of urination   . Glaucoma   . History of asbestos exposure   . History of radiation therapy 8/19-13-10/18/11   prostate, 83 GY  . History of shingles 2012-- BILATERAL EYES--  NO RESIDUAL  . Hyperlipidemia   . Hypertension   . Inguinal hernia   . Lumbar scoliosis   . Nocturia   . OA (osteoarthritis) of hip RIGHT  . Prostate cancer ([place]) 05/11/2011   bx=Adenocarcinoma,gleason3+4=7, 4+4=8,PSA=10.30volume=45.7cc  . Renal cyst    bilateral  . Smokers' cough ([place])   . Thyroid cyst     Past Surgical History:  Procedure Laterality Date  . ATTEMPTED LEFT VATS/ LEFT THORACOTOMY/ RESECTION OF THE ENORMOUS, PROBABLE BRONCHOGENIC CYST  01-04-2005  DR Arlyce Dice  . CARDIAC CATHETERIZATION  02-24-2009  DR NASHER   MINOR CORONARY ARTERY IRREGULARITIES/ NORMAL LVSF/ EF 65-70%  . COLONOSCOPY W/ POLYPECTOMY    .  CYSTOSCOPY  11/15/2011   Procedure: CYSTOSCOPY;  Surgeon: Dutch Gray, MD;  Location: [facility];  Service: Urology;  Laterality: N/A;  no seeds seen in bladder  . esophageus cyst removal  YRS AGO  . [place]  . PROSTATE BIOPSY  05/11/11   Adenocarcinoma ([facility] OFFICE)  . RADIOACTIVE SEED IMPLANT  11/15/2011   Procedure: RADIOACTIVE SEED IMPLANT;  Surgeon: Dutch Gray, MD;  Location: [facility];  Service: Urology;  Laterality: N/A;  Total number of seeds - 52  . REPAIR RIGHT INGUINAL HERNIA W/ MESH  09-10-1999  . TOTAL HIP ARTHROPLASTY Right 03/20/2013   Procedure: RIGHT TOTAL HIP ARTHROPLASTY ANTERIOR APPROACH;  Surgeon: [name], MD;  Location: [facility];  Service: Orthopedics;  Laterality: Right;  . TOTAL HIP REVISION Right 05/14/2013   Procedure: open reduction internal fixation REVISION RIGHT HIP ;  Surgeon: [name], MD;  Location: [facility];  Service: Orthopedics;  Laterality: Right;  . UMBILICAL HERNIA REPAIR  1998   epigastric    There were no vitals filed for this visit.       Subjective Assessment - 09/20/16 1659    Subjective Patient is having left leg pain and left leg weakness. He is having diffiulty with walking and all mobility.    Patient is accompained by: Family member   Pertinent History Patient an having dizziness  and gait disturbance in 2015 and it has not gotten better. He had the right hip replaced in 2015 . He is having LLE leg giving away and he started falling. Now he can not always  bear weight on his leg.     Limitations Standing;Walking   How long can you stand comfortably? unknown , vairable   How long can you walk comfortably? unknown , variable   Currently in Pain? Yes   Pain Location Leg   Pain Orientation Left   Pain Descriptors / Indicators Aching   Pain Type Chronic pain   Pain Onset More than a month ago   Pain Frequency Constant   Aggravating Factors  walking    Multiple Pain Sites No             [facility] PT Assessment - 09/20/16 1705      Assessment   Medical Diagnosis leg pain and weakness   Referring Provider [facility], [facility] K    Onset Date/Surgical Date 09/10/16   Hand Dominance Right   Prior Therapy no     Precautions   Precautions Fall     Restrictions   Weight Bearing Restrictions No     Balance Screen   Has the patient fallen in the past 6 months Yes   How many times? --  2   Has the patient had a decrease in activity level because of a fear of falling?  Yes   Is the patient reluctant to leave their home because of a fear of falling?  No     Home Ecologist residence   Living Arrangements Spouse/significant other   Available Help at [place] to enter   Entrance Stairs-Number of Steps --  2   [place] One level   [place] - 2 wheels;Cane - single point;Bedside commode     Prior Function   Level of Independence Independent with household mobility with device   Leisure --  TV       POSTURE: flexed trunk   PROM/AROM: LLE PROM WFL, AROM is decreased due to weakness LLE hip  STRENGTH:  Graded on a 0-5 scale Muscle Group Left Right                          Hip Flex -4/5 1/5  Hip Abd -4/5 1/5  Hip Add  1/5  Hip Ext 1/5 1/5  Hip IR/ER -2/5 2/5  Knee Flex 5/5 5/5  Knee Ext 5/5 5/5  Ankle DF 5/5 5/5  Ankle PF 5/5 5/5   SENSATION: numbness in B feet and toes  FUNCTIONAL MOBILITY:  Poor control with transfers sit to stand ; needs UE support  Decreased mobility with slow rolling and supine to sit with increased difficulty   BALANCE: unable to perform single leg stand, unable to tandem stand, Static standing is fair ( Patient is able to stand unsupported without UE support and without LOB for 1-2 minutes)  Dynamic standing Poor  +   ( Patient is able to stand with min assit and reach ipsilaterally , unable to weight shift )     GAIT: Patient's gait is slow, unsteady with hip assymetry and slight circumduction with hurricane cane For short distances .    OUTCOME MEASURES: TEST Outcome Interpretation  5 times sit<>stand 38.90sec >60 yo, >15 sec indicates increased risk for falls  10 meter  walk test   . 46              m/s <1.0 m/s indicates increased risk for falls; limited community ambulator  Timed up and Go            33 sec     sec <14 sec indicates increased risk for falls  LEFS 13/80 80/80 is normal            Treatment: Seated hip flex x 20 x 2 Seated LAQ x 20 x 2 Sit to stand x 10  Cues for safety and for correct technique Written hand out given       Objective measurements completed on examination: See above findings.                  PT Education - 09/20/16 1710    Education provided Yes   Education Details plan of care   Person(s) Educated Patient   Methods Explanation   Comprehension Verbalized understanding          PT Short Term Goals - 09/21/16 1136      PT SHORT TERM GOAL #1   Title Patient will complete a sit to stand transfer with proper placement of hands and with out loss of balance to be safer and reduce his falls risk   Time 4   Period Weeks   Status New   Target Date 10/19/16     PT SHORT TERM GOAL #2   Title Patient will increase BLE gross strength to 3/5 as to improve functional strength for independent gait, increased standing tolerance and increased ADL ability.   Time 4   Period Weeks   Status New   Target Date 10/19/16           PT Long Term Goals - 09/21/16 1134      PT LONG TERM GOAL #1   Title Patient will be independent in home exercise program to improve strength/mobility for better functional independence with ADLs.   Time 8   Period Weeks   Status New   Target Date 11/16/16     PT LONG TERM GOAL #2   Title Patient (< 3 years old) will complete five times sit to stand test in < 10 seconds indicating an increased LE  strength and improved balance.   Time 8   Period Weeks   Status New   Target Date 11/16/16     PT LONG TERM GOAL #3   Title Patient will increase 10 meter walk test to >1.66m/s as to improve gait speed for better community ambulation and to reduce fall risk.   Time 8   Period Weeks   Status New   Target Date 11/16/16                Plan - 09/21/16 0837    Clinical Impression Statement Patient is 83 year old male with chronic LLE pain and weakness over the past 3 years that is getting worse. He was referred by MD with Dx of gait idisturbance and instability. He presents with LLE  hip weakness , decreased bed mobility and decreased transfers sit to stand, decreased static and dynamic standing balance. He has 13/80 LEFS indicating poor mobility. He ambulates with hurricane cane with unstadiness and min assist for 25 feet with decreased gait speed and poor gait quality. He will benefit from skilled PT to improve LLE strength , balance and dereaase his falls risk.   History and Personal Factors relevant  to plan of care: falls, age, steps   Clinical Presentation Unstable   Clinical Presentation due to:  unpredictable character   Clinical Decision Making High   Rehab Potential Good   PT Frequency 2x / week   PT Duration 8 weeks   PT Treatment/Interventions Gait training;Functional mobility training;Neuromuscular re-education;Balance training;Therapeutic exercise;Therapeutic activities;Patient/family education   PT Next Visit Plan balance   Consulted and Agree with Plan of Care Patient      Patient will benefit from skilled therapeutic intervention in order to improve the following deficits and impairments:  Abnormal gait, Decreased balance, Decreased endurance, Decreased mobility, Difficulty walking, Decreased activity tolerance, Decreased strength, Impaired flexibility, Decreased safety awareness, Decreased knowledge of use of DME  Visit Diagnosis: Difficulty in walking, not  elsewhere classified  Muscle weakness (generalized)  Pain in left leg      G-Codes - September 27, 2016 0859    Functional Assessment Tool Used (Outpatient Only) 5 x sit to stand, TUG, 10 MW,    Functional Limitation Mobility: Walking and moving around   Mobility: Walking and Moving Around Current Status [phone]) At least 60 percent but less than 80 percent impaired, limited or restricted   Mobility: Walking and Moving Around Goal Status [phone]) At least 40 percent but less than 60 percent impaired, limited or restricted       Problem List Patient Active Problem List   Diagnosis Date Noted  . Cigarette smoker 06/16/2016  . Dyspnea on exertion 06/15/2016  . Dementia arising in the senium and presenium 01/07/2014  . CSA (central sleep apnea) 10/05/2013  . Syncope 06/26/2013  . COPD GOLD II 06/25/2013  . BPH (benign prostatic hyperplasia) 05/18/2013  . Glaucoma 05/18/2013  . S/P right TH revision 05/14/2013  . Constipation 03/26/2013  . Osteoporosis 03/26/2013  . Expected blood loss anemia 03/23/2013  . S/P right THA, AA 03/20/2013  . Hyperlipidemia   . Erectile dysfunction   . History of asbestos exposure   . Nocturia   . History of shingles   . Dermatitis, atopic   . OA (osteoarthritis) of hip   . Arthritis   . Frequency of urination   . Lumbar scoliosis   . Malignant neoplasm of prostate ([place]) 06/07/2011  . 83 year old gentleman with stage T3 adenocarcinoma prostate with Gleason score 4+4 and PSA of 10.3 05/11/2011    Alanson Puls, PT DPT 2016-09-27, 11:40 AM  [place] MAIN [facility] SERVICES [place], [place], [address] Phone: [phone]   Fax:  [phone]  Name: GERAN HAITHCOCK MRN: [id number] Date of Birth: 10-30-1931

## 2016-09-21 ENCOUNTER — Ambulatory Visit (INDEPENDENT_AMBULATORY_CARE_PROVIDER_SITE_OTHER): Payer: Medicare HMO | Admitting: Ophthalmology

## 2016-09-21 DIAGNOSIS — H35033 Hypertensive retinopathy, bilateral: Secondary | ICD-10-CM | POA: Diagnosis not present

## 2016-09-21 DIAGNOSIS — H35371 Puckering of macula, right eye: Secondary | ICD-10-CM

## 2016-09-21 DIAGNOSIS — H2513 Age-related nuclear cataract, bilateral: Secondary | ICD-10-CM | POA: Diagnosis not present

## 2016-09-21 DIAGNOSIS — H348322 Tributary (branch) retinal vein occlusion, left eye, stable: Secondary | ICD-10-CM

## 2016-09-21 DIAGNOSIS — I10 Essential (primary) hypertension: Secondary | ICD-10-CM | POA: Diagnosis not present

## 2016-09-21 DIAGNOSIS — H43813 Vitreous degeneration, bilateral: Secondary | ICD-10-CM | POA: Diagnosis not present

## 2016-09-21 NOTE — Addendum Note (Signed)
Addended by: Alanson Puls on: 09/21/2016 05:35 PM   Modules accepted: Orders

## 2016-09-23 ENCOUNTER — Encounter: Payer: Self-pay | Admitting: Physical Therapy

## 2016-09-23 ENCOUNTER — Ambulatory Visit: Payer: Medicare HMO | Admitting: Physical Therapy

## 2016-09-23 DIAGNOSIS — R262 Difficulty in walking, not elsewhere classified: Secondary | ICD-10-CM | POA: Diagnosis not present

## 2016-09-23 DIAGNOSIS — M79605 Pain in left leg: Secondary | ICD-10-CM

## 2016-09-23 DIAGNOSIS — M6281 Muscle weakness (generalized): Secondary | ICD-10-CM

## 2016-09-23 NOTE — Addendum Note (Signed)
Addended by: Alanson Puls on: 09/23/2016 04:37 PM   Modules accepted: Orders

## 2016-09-23 NOTE — Therapy (Addendum)
[place] MAIN [facility] SERVICES [address] [place], [place], [address] Phone: [phone]   Fax:  [phone]  Physical Therapy Treatment  Patient Details  Name: Derek Carr MRN: [id number] Date of Birth: 1931-08-02 Referring Provider: Jennings Books K   Encounter Date: 09/23/2016      PT End of Session - 09/23/16 1110    Visit Number 2   Number of Visits 17   Date for PT Re-Evaluation 11/15/16   Authorization Type 2/10 g codes   PT Start Time 1100   PT Stop Time 1140   PT Time Calculation (min) 40 min   Equipment Utilized During Treatment Gait belt   Activity Tolerance Patient tolerated treatment well;Patient limited by fatigue;Patient limited by pain   Behavior During Therapy [facility] for tasks assessed/performed      Past Medical History:  Diagnosis Date  . Anemia   . Atherosclerosis   . Complication of anesthesia    CONFUSION - Pt's family very concerned about this  . DDD (degenerative disc disease)   . Degenerative arthritis   . Dermatitis, atopic ARMS AND LEGS  . Diverticulosis   . Erectile dysfunction   . Frequency of urination   . Glaucoma   . History of asbestos exposure   . History of radiation therapy 8/19-13-10/18/11   prostate, 5 GY  . History of shingles 2012-- BILATERAL EYES--  NO RESIDUAL  . Hyperlipidemia   . Hypertension   . Inguinal hernia   . Lumbar scoliosis   . Nocturia   . OA (osteoarthritis) of hip RIGHT  . Prostate cancer ([place]) 05/11/2011   bx=Adenocarcinoma,gleason3+4=7, 4+4=8,PSA=10.30volume=45.7cc  . Renal cyst    bilateral  . Smokers' cough ([place])   . Thyroid cyst     Past Surgical History:  Procedure Laterality Date  . ATTEMPTED LEFT VATS/ LEFT THORACOTOMY/ RESECTION OF THE ENORMOUS, PROBABLE BRONCHOGENIC CYST  01-04-2005  DR Arlyce Dice  . CARDIAC CATHETERIZATION  02-24-2009  DR NASHER   MINOR CORONARY ARTERY IRREGULARITIES/ NORMAL LVSF/ EF 65-70%  . COLONOSCOPY W/ POLYPECTOMY    . CYSTOSCOPY   11/15/2011   Procedure: CYSTOSCOPY;  Surgeon: Dutch Gray, MD;  Location: [facility];  Service: Urology;  Laterality: N/A;  no seeds seen in bladder  . esophageus cyst removal  YRS AGO  . [place]  . PROSTATE BIOPSY  05/11/11   Adenocarcinoma (MD OFFICE)  . RADIOACTIVE SEED IMPLANT  11/15/2011   Procedure: RADIOACTIVE SEED IMPLANT;  Surgeon: Dutch Gray, MD;  Location: [facility];  Service: Urology;  Laterality: N/A;  Total number of seeds - 52  . REPAIR RIGHT INGUINAL HERNIA W/ MESH  09-10-1999  . TOTAL HIP ARTHROPLASTY Right 03/20/2013   Procedure: RIGHT TOTAL HIP ARTHROPLASTY ANTERIOR APPROACH;  Surgeon: [name], MD;  Location: [facility];  Service: Orthopedics;  Laterality: Right;  . TOTAL HIP REVISION Right 05/14/2013   Procedure: open reduction internal fixation REVISION RIGHT HIP ;  Surgeon: [name], MD;  Location: [facility];  Service: Orthopedics;  Laterality: Right;  . UMBILICAL HERNIA REPAIR  1998   epigastric    There were no vitals filed for this visit.      Subjective Assessment - 09/23/16 1108    Subjective Patient reports that he is doing his HEP several times a day.    Patient is accompained by: Family member   Pertinent History Patient an having dizziness and gait disturbance in 2015 and it has  not gotten better. He had the right hip replaced in 2015 . He is having LLE leg giving away and he started falling. Now he can not always  bear weight on his leg.     Limitations Standing;Walking   Currently in Pain? No/denies   Multiple Pain Sites No      Treatment: [place] MAIN [facility] SERVICES [address] [place], [place], [address] Phone: [phone]   Fax:  [phone]  Physical Therapy Treatment  Patient Details  Name: Derek Carr MRN: [id number] Date of Birth: 10-Mar-1931 Referring Provider: Jennings Books K   Encounter Date: 09/23/2016      PT End of Session -  09/23/16 1110    Visit Number 2   Number of Visits 17   Date for PT Re-Evaluation 11/15/16   Authorization Type 2/10 g codes   PT Start Time 1100   PT Stop Time 1140   PT Time Calculation (min) 40 min   Equipment Utilized During Treatment Gait belt   Activity Tolerance Patient tolerated treatment well;Patient limited by fatigue;Patient limited by pain   Behavior During Therapy [facility] for tasks assessed/performed      Past Medical History:  Diagnosis Date  . Anemia   . Atherosclerosis   . Complication of anesthesia    CONFUSION - Pt's family very concerned about this  . DDD (degenerative disc disease)   . Degenerative arthritis   . Dermatitis, atopic ARMS AND LEGS  . Diverticulosis   . Erectile dysfunction   . Frequency of urination   . Glaucoma   . History of asbestos exposure   . History of radiation therapy 8/19-13-10/18/11   prostate, 36 GY  . History of shingles 2012-- BILATERAL EYES--  NO RESIDUAL  . Hyperlipidemia   . Hypertension   . Inguinal hernia   . Lumbar scoliosis   . Nocturia   . OA (osteoarthritis) of hip RIGHT  . Prostate cancer ([place]) 05/11/2011   bx=Adenocarcinoma,gleason3+4=7, 4+4=8,PSA=10.30volume=45.7cc  . Renal cyst    bilateral  . Smokers' cough ([place])   . Thyroid cyst     Past Surgical History:  Procedure Laterality Date  . ATTEMPTED LEFT VATS/ LEFT THORACOTOMY/ RESECTION OF THE ENORMOUS, PROBABLE BRONCHOGENIC CYST  01-04-2005  DR Arlyce Dice  . CARDIAC CATHETERIZATION  02-24-2009  DR NASHER   MINOR CORONARY ARTERY IRREGULARITIES/ NORMAL LVSF/ EF 65-70%  . COLONOSCOPY W/ POLYPECTOMY    . CYSTOSCOPY  11/15/2011   Procedure: CYSTOSCOPY;  Surgeon: Dutch Gray, MD;  Location: [facility];  Service: Urology;  Laterality: N/A;  no seeds seen in bladder  . esophageus cyst removal  YRS AGO  . [place]  . PROSTATE BIOPSY  05/11/11   Adenocarcinoma (MD OFFICE)  . RADIOACTIVE SEED IMPLANT  11/15/2011   Procedure: RADIOACTIVE  SEED IMPLANT;  Surgeon: Dutch Gray, MD;  Location: [facility];  Service: Urology;  Laterality: N/A;  Total number of seeds - 52  . REPAIR RIGHT INGUINAL HERNIA W/ MESH  09-10-1999  . TOTAL HIP ARTHROPLASTY Right 03/20/2013   Procedure: RIGHT TOTAL HIP ARTHROPLASTY ANTERIOR APPROACH;  Surgeon: [name], MD;  [facility];  Service: Orthopedics;  Laterality: Right;  . TOTAL HIP REVISION Right 05/14/2013   Procedure: open reduction internal fixation REVISION RIGHT HIP ;  Surgeon: [name], MD;  Location: [facility];  Service: Orthopedics;  Laterality: Right;  . UMBILICAL HERNIA REPAIR  1998   epigastric  There were no vitals filed for this visit.      Subjective Assessment - 09/23/16 1108    Subjective Patient reports that he is doing his HEP several times a day.    Patient is accompained by: Family member   Pertinent History Patient an having dizziness and gait disturbance in 2015 and it has not gotten better. He had the right hip replaced in 2015 . He is having LLE leg giving away and he started falling. Now he can not always  bear weight on his leg.     Limitations Standing;Walking   Currently in Pain? No/denies   Multiple Pain Sites No      Treatment:  hooklying with 4 lbs and marching x 10 x 2  SAQ with 4 lbs BLE sidelying hip abd x 10  sidelying clam x 20  LAQs 2x10 with 4 lbs x 20 with 5 sec hold bilaterally Supine SLR with cues to keep LE in extension Supine  hip slide  abd with assist,  2x10 each,  VC to keep hip in neutral with abduction.  Supine clamshell with YTB 2x10 Heel raises with UE support 2 x 10; Sit to stand without UE support 2x10, cues for proper hand placement and leaning fwd  Bridges x 10 x 2, cues for good height  Gait training: With RW and 772 feet x  with CGA assist and cues for upright posture  CGA and Min to mod verbal cues used throughout Continues to have balance deficits typical with diagnosis. Patient performs  intermediate level exercises without pain behaviors and needs verbal cuing for postural alignment                        PT Education - 09/23/16 1110    Education provided Yes   Education Details plan of care and HEP   Person(s) Educated Patient   Methods Explanation;Demonstration   Comprehension Verbalized understanding;Returned demonstration;Verbal cues required          PT Short Term Goals - 09/21/16 1136      PT SHORT TERM GOAL #1   Title Patient will complete a sit to stand transfer with proper placement of hands and with out loss of balance to be safer and reduce his falls risk   Time 4   Period Weeks   Status New   Target Date 10/19/16     PT SHORT TERM GOAL #2   Title Patient will increase BLE gross strength to 3/5 as to improve functional strength for independent gait, increased standing tolerance and increased ADL ability.   Time 4   Period Weeks   Status New   Target Date 10/19/16           PT Long Term Goals - 09/21/16 1134      PT LONG TERM GOAL #1   Title Patient will be independent in home exercise program to improve strength/mobility for better functional independence with ADLs.   Time 8   Period Weeks   Status New   Target Date 11/16/16     PT LONG TERM GOAL #2   Title Patient (< 92 years old) will complete five times sit to stand test in < 10 seconds indicating an increased LE strength and improved balance.   Time 8   Period Weeks   Status New   Target Date 11/16/16     PT LONG TERM GOAL #3   Title Patient will increase 10 meter walk test to >  1.71m/s as to improve gait speed for better community ambulation and to reduce fall risk.   Time 8   Period Weeks   Status New   Target Date 11/16/16               Plan - 09/23/16 1111    Clinical Impression Statement Pt requires direction and verbal cues for correct performance of exercises. Patient has fatigue with RLE foot clearance during swing phase and needs cues to pick  up RLE.   Patient struggles with speed during movement as well as balance with unstable surfaces. Pt encouraged to continue HEP  Patient will benefit from continued skilled PT to improve mobility and safety   Rehab Potential Good   PT Frequency 2x / week   PT Duration 8 weeks   PT Treatment/Interventions Gait training;Functional mobility training;Neuromuscular re-education;Balance training;Therapeutic exercise;Therapeutic activities;Patient/family education   PT Next Visit Plan balance   Consulted and Agree with Plan of Care Patient      Patient will benefit from skilled therapeutic intervention in order to improve the following deficits and impairments:  Abnormal gait, Decreased balance, Decreased endurance, Decreased mobility, Difficulty walking, Decreased activity tolerance, Decreased strength, Impaired flexibility, Decreased safety awareness, Decreased knowledge of use of DME  Visit Diagnosis: Difficulty in walking, not elsewhere classified  Muscle weakness (generalized)  Pain in left leg     Problem List Patient Active Problem List   Diagnosis Date Noted  . Cigarette smoker 06/16/2016  . Dyspnea on exertion 06/15/2016  . Dementia arising in the senium and presenium 01/07/2014  . CSA (central sleep apnea) 10/05/2013  . Syncope 06/26/2013  . COPD GOLD II 06/25/2013  . BPH (benign prostatic hyperplasia) 05/18/2013  . Glaucoma 05/18/2013  . S/P right TH revision 05/14/2013  . Constipation 03/26/2013  . Osteoporosis 03/26/2013  . Expected blood loss anemia 03/23/2013  . S/P right THA, AA 03/20/2013  . Hyperlipidemia   . Erectile dysfunction   . History of asbestos exposure   . Nocturia   . History of shingles   . Dermatitis, atopic   . OA (osteoarthritis) of hip   . Arthritis   . Frequency of urination   . Lumbar scoliosis   . Malignant neoplasm of prostate ([place]) 06/07/2011  . 83 year old gentleman with stage T3 adenocarcinoma prostate with Gleason score 4+4 and PSA  of 10.3 05/11/2011    Arelia Sneddon S,PT DPT 09/23/2016, 4:33 PM  [place] MAIN [facility] SERVICES [address] [place], [place], [address] Phone: [phone]   Fax:  [phone]  Name: Derek Carr MRN: [id number] Date of Birth: November 27, 1931                               PT Education - 09/23/16 1110    Education provided Yes   Education Details plan of care and HEP   Person(s) Educated Patient   Methods Explanation;Demonstration   Comprehension Verbalized understanding;Returned demonstration;Verbal cues required          PT Short Term Goals - 09/21/16 1136      PT SHORT TERM GOAL #1   Title Patient will complete a sit to stand transfer with proper placement of hands and with out loss of balance to be safer and reduce his falls risk   Time 4   Period Weeks   Status New   Target Date 10/19/16     PT SHORT TERM GOAL #2  Title Patient will increase BLE gross strength to 3/5 as to improve functional strength for independent gait, increased standing tolerance and increased ADL ability.   Time 4   Period Weeks   Status New   Target Date 10/19/16           PT Long Term Goals - 09/21/16 1134      PT LONG TERM GOAL #1   Title Patient will be independent in home exercise program to improve strength/mobility for better functional independence with ADLs.   Time 8   Period Weeks   Status New   Target Date 11/16/16     PT LONG TERM GOAL #2   Title Patient (< 17 years old) will complete five times sit to stand test in < 10 seconds indicating an increased LE strength and improved balance.   Time 8   Period Weeks   Status New   Target Date 11/16/16     PT LONG TERM GOAL #3   Title Patient will increase 10 meter walk test to >1.70m/s as to improve gait speed for better community ambulation and to reduce fall risk.   Time 8   Period Weeks   Status New   Target Date 11/16/16                Plan - 09/23/16 1111    Clinical Impression Statement Pt requires direction and verbal cues for correct performance of exercises. Patient has fatigue with RLE foot clearance during swing phase and needs cues to pick up RLE.   Patient struggles with speed during movement as well as balance with unstable surfaces. Pt encouraged to continue HEP  Patient will benefit from continued skilled PT to improve mobility and safety   Rehab Potential Good   PT Frequency 2x / week   PT Duration 8 weeks   PT Treatment/Interventions Gait training;Functional mobility training;Neuromuscular re-education;Balance training;Therapeutic exercise;Therapeutic activities;Patient/family education   PT Next Visit Plan balance   Consulted and Agree with Plan of Care Patient      Patient will benefit from skilled therapeutic intervention in order to improve the following deficits and impairments:  Abnormal gait, Decreased balance, Decreased endurance, Decreased mobility, Difficulty walking, Decreased activity tolerance, Decreased strength, Impaired flexibility, Decreased safety awareness, Decreased knowledge of use of DME  Visit Diagnosis: Difficulty in walking, not elsewhere classified  Muscle weakness (generalized)  Pain in left leg     Problem List Patient Active Problem List   Diagnosis Date Noted  . Cigarette smoker 06/16/2016  . Dyspnea on exertion 06/15/2016  . Dementia arising in the senium and presenium 01/07/2014  . CSA (central sleep apnea) 10/05/2013  . Syncope 06/26/2013  . COPD GOLD II 06/25/2013  . BPH (benign prostatic hyperplasia) 05/18/2013  . Glaucoma 05/18/2013  . S/P right TH revision 05/14/2013  . Constipation 03/26/2013  . Osteoporosis 03/26/2013  . Expected blood loss anemia 03/23/2013  . S/P right THA, AA 03/20/2013  . Hyperlipidemia   . Erectile dysfunction   . History of asbestos exposure   . Nocturia   . History of shingles   . Dermatitis, atopic   . OA (osteoarthritis) of  hip   . Arthritis   . Frequency of urination   . Lumbar scoliosis   . Malignant neoplasm of prostate ([place]) 06/07/2011  . 83 year old gentleman with stage T3 adenocarcinoma prostate with Gleason score 4+4 and PSA of 10.3 05/11/2011    Arelia Sneddon S,PT DPT 09/23/2016, 4:33 PM  [place], [place], [address] Phone: [phone]   Fax:  [phone]  Name: Derek Carr MRN: [id number] Date of Birth: 01-21-1932

## 2016-09-24 DIAGNOSIS — R2681 Unsteadiness on feet: Secondary | ICD-10-CM | POA: Diagnosis not present

## 2016-09-24 DIAGNOSIS — G8929 Other chronic pain: Secondary | ICD-10-CM | POA: Diagnosis not present

## 2016-09-24 DIAGNOSIS — M5442 Lumbago with sciatica, left side: Secondary | ICD-10-CM | POA: Diagnosis not present

## 2016-09-28 ENCOUNTER — Ambulatory Visit: Payer: Medicare HMO | Attending: Neurology | Admitting: Physical Therapy

## 2016-09-28 ENCOUNTER — Encounter: Payer: Self-pay | Admitting: Physical Therapy

## 2016-09-28 DIAGNOSIS — M79605 Pain in left leg: Secondary | ICD-10-CM | POA: Insufficient documentation

## 2016-09-28 DIAGNOSIS — R262 Difficulty in walking, not elsewhere classified: Secondary | ICD-10-CM | POA: Diagnosis not present

## 2016-09-28 DIAGNOSIS — M6281 Muscle weakness (generalized): Secondary | ICD-10-CM | POA: Diagnosis present

## 2016-09-29 NOTE — Therapy (Signed)
[place] MAIN [facility] SERVICES [address] [place], [place], [address] Phone: [phone]   Fax:  [phone]  Physical Therapy Treatment  Patient Details  Name: Derek Carr MRN: [id number] Date of Birth: Jun 02, 1931 Referring Provider: Vladimir Crofts   Encounter Date: 09/28/2016      PT End of Session - 09/28/16 1411    Visit Number 3   Number of Visits 17   Date for PT Re-Evaluation 11/15/16   Authorization Type 3/10 g codes   Equipment Utilized During Treatment Gait belt   Activity Tolerance Patient tolerated treatment well;Patient limited by fatigue;Patient limited by pain   Behavior During Therapy [facility] for tasks assessed/performed      Past Medical History:  Diagnosis Date  . Anemia   . Atherosclerosis   . Complication of anesthesia    CONFUSION - Pt's family very concerned about this  . DDD (degenerative disc disease)   . Degenerative arthritis   . Dermatitis, atopic ARMS AND LEGS  . Diverticulosis   . Erectile dysfunction   . Frequency of urination   . Glaucoma   . History of asbestos exposure   . History of radiation therapy 8/19-13-10/18/11   prostate, 60 GY  . History of shingles 2012-- BILATERAL EYES--  NO RESIDUAL  . Hyperlipidemia   . Hypertension   . Inguinal hernia   . Lumbar scoliosis   . Nocturia   . OA (osteoarthritis) of hip RIGHT  . Prostate cancer ([place]) 05/11/2011   bx=Adenocarcinoma,gleason3+4=7, 4+4=8,PSA=10.30volume=45.7cc  . Renal cyst    bilateral  . Smokers' cough ([place])   . Thyroid cyst     Past Surgical History:  Procedure Laterality Date  . ATTEMPTED LEFT VATS/ LEFT THORACOTOMY/ RESECTION OF THE ENORMOUS, PROBABLE BRONCHOGENIC CYST  01-04-2005  DR Arlyce Dice  . CARDIAC CATHETERIZATION  02-24-2009  DR NASHER   MINOR CORONARY ARTERY IRREGULARITIES/ NORMAL LVSF/ EF 65-70%  . COLONOSCOPY W/ POLYPECTOMY    . CYSTOSCOPY  11/15/2011   Procedure: CYSTOSCOPY;  Surgeon: Dutch Gray, MD;  Location:  [facility];  Service: Urology;  Laterality: N/A;  no seeds seen in bladder  . esophageus cyst removal  YRS AGO  . [place]  . PROSTATE BIOPSY  05/11/11   Adenocarcinoma (MD OFFICE)  . RADIOACTIVE SEED IMPLANT  11/15/2011   Procedure: RADIOACTIVE SEED IMPLANT;  Surgeon: Dutch Gray, MD;  Location: [facility];  Service: Urology;  Laterality: N/A;  Total number of seeds - 52  . REPAIR RIGHT INGUINAL HERNIA W/ MESH  09-10-1999  . TOTAL HIP ARTHROPLASTY Right 03/20/2013   Procedure: RIGHT TOTAL HIP ARTHROPLASTY ANTERIOR APPROACH;  Surgeon: [name], MD;  [facility];  Service: Orthopedics;  Laterality: Right;  . TOTAL HIP REVISION Right 05/14/2013   Procedure: open reduction internal fixation REVISION RIGHT HIP ;  Surgeon: [name], MD;  [facility];  Service: Orthopedics;  Laterality: Right;  . UMBILICAL HERNIA REPAIR  1998   epigastric    There were no vitals filed for this visit.      Subjective Assessment - 09/28/16 1542    Subjective Patient reports that he is doing his HEP several times a day. Family is very supporting of the patient. He is moving better at home wiht his walking and in bed.    Patient is accompained by: Family member   Pertinent History Patient an having dizziness and gait disturbance in 2015 and it has not  gotten better. He had the right hip replaced in 2015 . He is having LLE leg giving away and he started falling. Now he can not always  bear weight on his leg.     Limitations Standing;Walking   How long can you stand comfortably? unknown , vairable   How long can you walk comfortably? unknown , variable   Currently in Pain? No/denies   Pain Onset More than a month ago   Multiple Pain Sites (P)  No      Treatment:  hooklying with 4 lbs and marching x 10 x 2  SAQ with 4 lbs BLE sidelying hip abd  3 lbs x 10  sidelying clam   BTB x 20  LAQs 2x10 with 4 lbs x 20 with 5 sec hold  bilaterally Supine SLR with cues to keep LE in extension Supine clamshell with BTB 2x10 Heel raises with UE support 2 x 10; Sit to stand without UE support 2x10, cues for proper hand placement and leaning fwd  Bridges x 10 x 2, cues for good height Gait training: With RW and 300  feet x  with CGA assist and cues for upright posture  CGA and Min to mod verbal cues used throughout Continues to have balance deficits typical with diagnosis. Patient performs intermediate level exercises without pain behaviors and needs verbal cuing for postural alignment                            PT Education - 09/29/16 1410    Education provided Yes   Education Details progression of HEP   Person(s) Educated Patient   Methods Explanation;Demonstration   Comprehension Verbalized understanding;Returned demonstration          PT Short Term Goals - 09/21/16 1136      PT SHORT TERM GOAL #1   Title Patient will complete a sit to stand transfer with proper placement of hands and with out loss of balance to be safer and reduce his falls risk   Time 4   Period Weeks   Status New   Target Date 10/19/16     PT SHORT TERM GOAL #2   Title Patient will increase BLE gross strength to 3/5 as to improve functional strength for independent gait, increased standing tolerance and increased ADL ability.   Time 4   Period Weeks   Status New   Target Date 10/19/16           PT Long Term Goals - 09/21/16 1134      PT LONG TERM GOAL #1   Title Patient will be independent in home exercise program to improve strength/mobility for better functional independence with ADLs.   Time 8   Period Weeks   Status New   Target Date 11/16/16     PT LONG TERM GOAL #2   Title Patient (83 years old) will complete five times sit to stand test in < 10 seconds indicating an increased LE strength and improved balance.   Time 8   Period Weeks   Status New   Target Date 11/16/16     PT LONG TERM  GOAL #3   Title Patient will increase 10 meter walk test to >1.60m/s as to improve gait speed for better community ambulation and to reduce fall risk.   Time 8   Period Weeks   Status New   Target Date 11/16/16  Plan - 09/28/16 1411    Clinical Impression Statement Patient is able to perform BLE exercises in supine and is able to advance to [facility] wiht 3 lb wiehts without pain behaviors. He is able to move easier on the mat table and move from supine to sit without assist. He is progressiing with his transfers from sit to stand with less UE support. He will continue to benefit from skilled PT to improve mobility and quality of life.    Rehab Potential Good   PT Frequency 2x / week   PT Duration 8 weeks   PT Treatment/Interventions Gait training;Functional mobility training;Neuromuscular re-education;Balance training;Therapeutic exercise;Therapeutic activities;Patient/family education   PT Next Visit Plan balance   Consulted and Agree with Plan of Care Patient      Patient will benefit from skilled therapeutic intervention in order to improve the following deficits and impairments:  Abnormal gait, Decreased balance, Decreased endurance, Decreased mobility, Difficulty walking, Decreased activity tolerance, Decreased strength, Impaired flexibility, Decreased safety awareness, Decreased knowledge of use of DME  Visit Diagnosis: Difficulty in walking, not elsewhere classified  Muscle weakness (generalized)  Pain in left leg     Problem List Patient Active Problem List   Diagnosis Date Noted  . Cigarette smoker 06/16/2016  . Dyspnea on exertion 06/15/2016  . Dementia arising in the senium and presenium 01/07/2014  . CSA (central sleep apnea) 10/05/2013  . Syncope 06/26/2013  . COPD GOLD II 06/25/2013  . BPH (benign prostatic hyperplasia) 05/18/2013  . Glaucoma 05/18/2013  . S/P right TH revision 05/14/2013  . Constipation 03/26/2013  . Osteoporosis 03/26/2013   . Expected blood loss anemia 03/23/2013  . S/P right THA, AA 03/20/2013  . Hyperlipidemia   . Erectile dysfunction   . History of asbestos exposure   . Nocturia   . History of shingles   . Dermatitis, atopic   . OA (osteoarthritis) of hip   . Arthritis   . Frequency of urination   . Lumbar scoliosis   . Malignant neoplasm of prostate ([place]) 06/07/2011  . 83 year old gentleman with stage T3 adenocarcinoma prostate with Gleason score 4+4 and PSA of 10.3 05/11/2011    Alanson Puls, PT DPT 09/29/2016, 2:18 PM  [place] MAIN [facility] SERVICES [address] [place], [place], [address] Phone: [phone]   Fax:  [phone]  Name: JEWEL VENDITTO MRN: [id number] Date of Birth: 1931-04-05

## 2016-09-30 ENCOUNTER — Encounter: Payer: Self-pay | Admitting: Physical Therapy

## 2016-09-30 ENCOUNTER — Ambulatory Visit: Payer: Medicare HMO | Admitting: Physical Therapy

## 2016-09-30 DIAGNOSIS — M6281 Muscle weakness (generalized): Secondary | ICD-10-CM

## 2016-09-30 DIAGNOSIS — R262 Difficulty in walking, not elsewhere classified: Secondary | ICD-10-CM

## 2016-09-30 DIAGNOSIS — M79605 Pain in left leg: Secondary | ICD-10-CM

## 2016-09-30 NOTE — Therapy (Signed)
[place] MAIN [facility] SERVICES [address] [place], [place], [address] Phone: [phone]   Fax:  [phone]  Physical Therapy Treatment  Patient Details  Name: Derek Carr MRN: [id number] Date of Birth: [birth year] Referring Provider: Vladimir Crofts   Encounter Date: 09/30/2016      PT End of Session - 09/30/16 1105    Visit Number 4   Number of Visits 17   Date for PT Re-Evaluation 11/15/16   Authorization Type 4/10 g codes   PT Start Time 0973   PT Stop Time 1125   PT Time Calculation (min) 40 min   Equipment Utilized During Treatment Gait belt   Activity Tolerance Patient tolerated treatment well;Patient limited by fatigue;Patient limited by pain   Behavior During Therapy [facility] for tasks assessed/performed      Past Medical History:  Diagnosis Date  . Anemia   . Atherosclerosis   . Complication of anesthesia    CONFUSION - Pt's family very concerned about this  . DDD (degenerative disc disease)   . Degenerative arthritis   . Dermatitis, atopic ARMS AND LEGS  . Diverticulosis   . Erectile dysfunction   . Frequency of urination   . Glaucoma   . History of asbestos exposure   . History of radiation therapy 8/19-13-10/18/11   prostate, 67 GY  . History of shingles 2012-- BILATERAL EYES--  NO RESIDUAL  . Hyperlipidemia   . Hypertension   . Inguinal hernia   . Lumbar scoliosis   . Nocturia   . OA (osteoarthritis) of hip RIGHT  . Prostate cancer ([place]) 05/11/2011   bx=Adenocarcinoma,gleason3+4=7, 4+4=8,PSA=10.30volume=45.7cc  . Renal cyst    bilateral  . Smokers' cough ([place])   . Thyroid cyst     Past Surgical History:  Procedure Laterality Date  . ATTEMPTED LEFT VATS/ LEFT THORACOTOMY/ RESECTION OF THE ENORMOUS, PROBABLE BRONCHOGENIC CYST  01-04-2005  DR Arlyce Dice  . CARDIAC CATHETERIZATION  02-24-2009  DR NASHER   MINOR CORONARY ARTERY IRREGULARITIES/ NORMAL LVSF/ EF 65-70%  . COLONOSCOPY W/ POLYPECTOMY    . CYSTOSCOPY   11/15/2011   Procedure: CYSTOSCOPY;  Surgeon: Dutch Gray, MD;  Location: [facility];  Service: Urology;  Laterality: N/A;  no seeds seen in bladder  . esophageus cyst removal  YRS AGO  . [place]  . PROSTATE BIOPSY  05/11/11   Adenocarcinoma (MD OFFICE)  . RADIOACTIVE SEED IMPLANT  11/15/2011   Procedure: RADIOACTIVE SEED IMPLANT;  Surgeon: Dutch Gray, MD;  Location: [facility];  Service: Urology;  Laterality: N/A;  Total number of seeds - 52  . REPAIR RIGHT INGUINAL HERNIA W/ MESH  09-10-1999  . TOTAL HIP ARTHROPLASTY Right 03/20/2013   Procedure: RIGHT TOTAL HIP ARTHROPLASTY ANTERIOR APPROACH;  Surgeon: [name], MD;  Location: [facility];  Service: Orthopedics;  Laterality: Right;  . TOTAL HIP REVISION Right 05/14/2013   Procedure: open reduction internal fixation REVISION RIGHT HIP ;  Surgeon: [name], MD;  Location: [facility];  Service: Orthopedics;  Laterality: Right;  . UMBILICAL HERNIA REPAIR  1998   epigastric    There were no vitals filed for this visit.      Subjective Assessment - 09/30/16 1103    Subjective Patient reports that he is doing his HEP several times a day. Family is very supporting of the patient. He is moving better at home wiht his walking and in bed. He walked down stairs from the  medical mall  for the first time did not arrive in the wc.    Patient is accompained by: Family member   Pertinent History Patient an having dizziness and gait disturbance in 2015 and it has not gotten better. He had the right hip replaced in 2015 . He is having LLE leg giving away and he started falling. Now he can not always  bear weight on his leg.     Limitations Standing;Walking   How long can you stand comfortably? unknown , vairable   How long can you walk comfortably? unknown , variable   Currently in Pain? No/denies   Pain Onset More than a month ago   Multiple Pain Sites No      Treatment:  Gait training with Rw  1000 feet and CGA, WC following; better gait speed, better posture  Standing hip abd x 15 BLE , cues for posture correction   Standing hip extension x 15 BLE cues to perform full ROM  Squats x 15 , 5 sec hold ,   Heel raises x 20, cues to keep his  Heels on the floor  Step over 1/2 foam fwd/ bwd x 20  Step over 1/2 foam side to side x 15   Side stepping left and right x 10 feet x 5 with YTB around knees  [employer] x 5 mins                             PT Education - 09/30/16 1105    Education provided Yes   Education Details progressed HEP and began standing exercises   Person(s) Educated Patient   Methods Explanation;Demonstration   Comprehension Verbalized understanding;Returned demonstration          PT Short Term Goals - 09/21/16 1136      PT SHORT TERM GOAL #1   Title Patient will complete a sit to stand transfer with proper placement of hands and with out loss of balance to be safer and reduce his falls risk   Time 4   Period Weeks   Status New   Target Date 10/19/16     PT SHORT TERM GOAL #2   Title Patient will increase BLE gross strength to 3/5 as to improve functional strength for independent gait, increased standing tolerance and increased ADL ability.   Time 4   Period Weeks   Status New   Target Date 10/19/16           PT Long Term Goals - 09/21/16 1134      PT LONG TERM GOAL #1   Title Patient will be independent in home exercise program to improve strength/mobility for better functional independence with ADLs.   Time 8   Period Weeks   Status New   Target Date 11/16/16     PT LONG TERM GOAL #2   Title Patient (< 59 years old) will complete five times sit to stand test in < 10 seconds indicating an increased LE strength and improved balance.   Time 8   Period Weeks   Status New   Target Date 11/16/16     PT LONG TERM GOAL #3   Title Patient will increase 10 meter walk test to >1.55m/s as to improve gait speed  for better community ambulation and to reduce fall risk.   Time 8   Period Weeks   Status New   Target Date 11/16/16  Plan - 09/30/16 1106    Clinical Impression Statement Patient is walking better with  less weakness and better posture and better gait speed. He is able to tolerate standing for longer periods of time and is able to perform standing exercises. He ambulated for 1000 feet with Rw and CGA. Patient will continue to benefit from skilled PT to improve mobility , strength and quality of life.   Rehab Potential Good   PT Frequency 2x / week   PT Duration 8 weeks   PT Treatment/Interventions Gait training;Functional mobility training;Neuromuscular re-education;Balance training;Therapeutic exercise;Therapeutic activities;Patient/family education   PT Next Visit Plan balance   Consulted and Agree with Plan of Care Patient      Patient will benefit from skilled therapeutic intervention in order to improve the following deficits and impairments:  Abnormal gait, Decreased balance, Decreased endurance, Decreased mobility, Difficulty walking, Decreased activity tolerance, Decreased strength, Impaired flexibility, Decreased safety awareness, Decreased knowledge of use of DME  Visit Diagnosis: Difficulty in walking, not elsewhere classified  Muscle weakness (generalized)  Pain in left leg     Problem List Patient Active Problem List   Diagnosis Date Noted  . Cigarette smoker 06/16/2016  . Dyspnea on exertion 06/15/2016  . Dementia arising in the senium and presenium 01/07/2014  . CSA (central sleep apnea) 10/05/2013  . Syncope 06/26/2013  . COPD GOLD II 06/25/2013  . BPH (benign prostatic hyperplasia) 05/18/2013  . Glaucoma 05/18/2013  . S/P right TH revision 05/14/2013  . Constipation 03/26/2013  . Osteoporosis 03/26/2013  . Expected blood loss anemia 03/23/2013  . S/P right THA, AA 03/20/2013  . Hyperlipidemia   . Erectile dysfunction   . History  of asbestos exposure   . Nocturia   . History of shingles   . Dermatitis, atopic   . OA (osteoarthritis) of hip   . Arthritis   . Frequency of urination   . Lumbar scoliosis   . Malignant neoplasm of prostate ([place]) 06/07/2011  . 83 year old gentleman with stage T3 adenocarcinoma prostate with Gleason score 4+4 and PSA of 10.3 05/11/2011    Alanson Puls, PT DPT 09/30/2016, 11:09 AM  [place] MAIN [facility] SERVICES [address] [place], [place], [address] Phone: [phone]   Fax:  [phone]  Name: WILBURT MESSINA MRN: [id number] Date of Birth: 24-May-1931

## 2016-10-05 ENCOUNTER — Ambulatory Visit: Payer: Medicare HMO | Admitting: Physical Therapy

## 2016-10-05 ENCOUNTER — Encounter: Payer: Self-pay | Admitting: Physical Therapy

## 2016-10-05 DIAGNOSIS — M79605 Pain in left leg: Secondary | ICD-10-CM

## 2016-10-05 DIAGNOSIS — R262 Difficulty in walking, not elsewhere classified: Secondary | ICD-10-CM

## 2016-10-05 DIAGNOSIS — M6281 Muscle weakness (generalized): Secondary | ICD-10-CM

## 2016-10-05 NOTE — Therapy (Signed)
[place] MAIN [facility] SERVICES [address] [place], [place], [address] Phone: [phone]   Fax:  [phone]  Physical Therapy Treatment  Patient Details  Name: Derek Carr MRN: [id number] Date of Birth: 01-07-1932 Referring Provider: Vladimir Crofts   Encounter Date: 10/05/2016      PT End of Session - 10/05/16 0901    Visit Number 5   Number of Visits 17   Date for PT Re-Evaluation 11/15/16   Authorization Type 5/10 g codes   PT Start Time 0851   PT Stop Time 0930   PT Time Calculation (min) 39 min   Equipment Utilized During Treatment Gait belt   Activity Tolerance Patient tolerated treatment well;Patient limited by fatigue;Patient limited by pain   Behavior During Therapy [facility] for tasks assessed/performed      Past Medical History:  Diagnosis Date  . Anemia   . Atherosclerosis   . Complication of anesthesia    CONFUSION - Pt's family very concerned about this  . DDD (degenerative disc disease)   . Degenerative arthritis   . Dermatitis, atopic ARMS AND LEGS  . Diverticulosis   . Erectile dysfunction   . Frequency of urination   . Glaucoma   . History of asbestos exposure   . History of radiation therapy 8/19-13-10/18/11   prostate, 23 GY  . History of shingles 2012-- BILATERAL EYES--  NO RESIDUAL  . Hyperlipidemia   . Hypertension   . Inguinal hernia   . Lumbar scoliosis   . Nocturia   . OA (osteoarthritis) of hip RIGHT  . Prostate cancer ([place]) 05/11/2011   bx=Adenocarcinoma,gleason3+4=7, 4+4=8,PSA=10.30volume=45.7cc  . Renal cyst    bilateral  . Smokers' cough ([place])   . Thyroid cyst     Past Surgical History:  Procedure Laterality Date  . ATTEMPTED LEFT VATS/ LEFT THORACOTOMY/ RESECTION OF THE ENORMOUS, PROBABLE BRONCHOGENIC CYST  01-04-2005  DR Arlyce Dice  . CARDIAC CATHETERIZATION  02-24-2009  DR NASHER   MINOR CORONARY ARTERY IRREGULARITIES/ NORMAL LVSF/ EF 65-70%  . COLONOSCOPY W/ POLYPECTOMY    . CYSTOSCOPY   11/15/2011   Procedure: CYSTOSCOPY;  Surgeon: Dutch Gray, MD;  [facility];  Service: Urology;  Laterality: N/A;  no seeds seen in bladder  . esophageus cyst removal  YRS AGO  . [place]  . PROSTATE BIOPSY  05/11/11   Adenocarcinoma (MD OFFICE)  . RADIOACTIVE SEED IMPLANT  11/15/2011   Procedure: RADIOACTIVE SEED IMPLANT;  Surgeon: Dutch Gray, MD;  Location: [facility];  Service: Urology;  Laterality: N/A;  Total number of seeds - 52  . REPAIR RIGHT INGUINAL HERNIA W/ MESH  09-10-1999  . TOTAL HIP ARTHROPLASTY Right 03/20/2013   Procedure: RIGHT TOTAL HIP ARTHROPLASTY ANTERIOR APPROACH;  Surgeon: [name], MD;  Location: [facility];  Service: Orthopedics;  Laterality: Right;  . TOTAL HIP REVISION Right 05/14/2013   Procedure: open reduction internal fixation REVISION RIGHT HIP ;  Surgeon: [name], MD;  Location: [facility];  Service: Orthopedics;  Laterality: Right;  . UMBILICAL HERNIA REPAIR  1998   epigastric    There were no vitals filed for this visit.      Subjective Assessment - 10/05/16 0900    Subjective Patient reports that he is doing well right now and he does have some left hip pain sometimes at night., He is walking around the home more.   Pertinent History Patient an having dizziness and gait  disturbance in 2015 and it has not gotten better. He had the right hip replaced in 2015 . He is having LLE leg giving away and he started falling. Now he can not always  bear weight on his leg.     Limitations Standing;Walking   How long can you stand comfortably? unknown , vairable   How long can you walk comfortably? unknown , variable   Currently in Pain? No/denies   Multiple Pain Sites No      Treatment:  Gait training with Rw 300 feet and CGA, WC following; better gait speed, better posture  Standing hip abd x 20 BLE , cues for posture correction   Standing hip extension x 20 BLE cues to perform full  ROM  Standing marching x 20   Standing heel raises x 20   Squats x 15 , 5 sec hold ,   Heel raises x 20, cues to keep his  Heels on the floor  BOUS ball lunges x 20 left and right with posture correction  Step ups to 6 inch stool fwd  X 20 BLE  Side stepping to 6 inch left side and then right side x 20 with posture correction   Patient needs demonstration of exercises and constant cuing for exercise technique.                            PT Education - 10/05/16 0901    Education provided Yes   Person(s) Educated Patient;Child(ren)   Methods Explanation;Demonstration   Comprehension Verbalized understanding;Returned demonstration;Verbal cues required          PT Short Term Goals - 09/21/16 1136      PT SHORT TERM GOAL #1   Title Patient will complete a sit to stand transfer with proper placement of hands and with out loss of balance to be safer and reduce his falls risk   Time 4   Period Weeks   Status New   Target Date 10/19/16     PT SHORT TERM GOAL #2   Title Patient will increase BLE gross strength to 3/5 as to improve functional strength for independent gait, increased standing tolerance and increased ADL ability.   Time 4   Period Weeks   Status New   Target Date 10/19/16           PT Long Term Goals - 09/21/16 1134      PT LONG TERM GOAL #1   Title Patient will be independent in home exercise program to improve strength/mobility for better functional independence with ADLs.   Time 8   Period Weeks   Status New   Target Date 11/16/16     PT LONG TERM GOAL #2   Title Patient (< 4 years old) will complete five times sit to stand test in < 10 seconds indicating an increased LE strength and improved balance.   Time 8   Period Weeks   Status New   Target Date 11/16/16     PT LONG TERM GOAL #3   Title Patient will increase 10 meter walk test to >1.62m/s as to improve gait speed for better community ambulation and to reduce  fall risk.   Time 8   Period Weeks   Status New   Target Date 11/16/16               Plan - 10/05/16 0902    Clinical Impression Statement Patient is able to perform closed  chain exercises with less fatigue and no rest periods. He is able to perfom static and dynamic standing balance  exercises with CGA and difficulty with single leg activities and with reaching. Patient will continue to benefit from skilled PT to improve mobility and strength.    Rehab Potential Good   PT Frequency 2x / week   PT Duration 8 weeks   PT Treatment/Interventions Gait training;Functional mobility training;Neuromuscular re-education;Balance training;Therapeutic exercise;Therapeutic activities;Patient/family education   PT Next Visit Plan balance   Consulted and Agree with Plan of Care Patient      Patient will benefit from skilled therapeutic intervention in order to improve the following deficits and impairments:  Abnormal gait, Decreased balance, Decreased endurance, Decreased mobility, Difficulty walking, Decreased activity tolerance, Decreased strength, Impaired flexibility, Decreased safety awareness, Decreased knowledge of use of DME  Visit Diagnosis: Difficulty in walking, not elsewhere classified  Muscle weakness (generalized)  Pain in left leg     Problem List Patient Active Problem List   Diagnosis Date Noted  . Cigarette smoker 06/16/2016  . Dyspnea on exertion 06/15/2016  . Dementia arising in the senium and presenium 01/07/2014  . CSA (central sleep apnea) 10/05/2013  . Syncope 06/26/2013  . COPD GOLD II 06/25/2013  . BPH (benign prostatic hyperplasia) 05/18/2013  . Glaucoma 05/18/2013  . S/P right TH revision 05/14/2013  . Constipation 03/26/2013  . Osteoporosis 03/26/2013  . Expected blood loss anemia 03/23/2013  . S/P right THA, AA 03/20/2013  . Hyperlipidemia   . Erectile dysfunction   . History of asbestos exposure   . Nocturia   . History of shingles   .  Dermatitis, atopic   . OA (osteoarthritis) of hip   . Arthritis   . Frequency of urination   . Lumbar scoliosis   . Malignant neoplasm of prostate ([place]) 06/07/2011  . 83 year old gentleman with stage T3 adenocarcinoma prostate with Gleason score 4+4 and PSA of 10.3 05/11/2011    Alanson Puls, PT DPT 10/05/2016, 9:38 AM  [place] MAIN [facility] SERVICES [place], [place], [address] Phone: [phone]   Fax:  [phone]  Name: Derek Carr MRN: [id number] Date of Birth: [birth year]

## 2016-10-07 ENCOUNTER — Encounter: Payer: Self-pay | Admitting: Physical Therapy

## 2016-10-07 ENCOUNTER — Ambulatory Visit: Payer: Medicare HMO | Admitting: Physical Therapy

## 2016-10-07 DIAGNOSIS — M79605 Pain in left leg: Secondary | ICD-10-CM

## 2016-10-07 DIAGNOSIS — M6281 Muscle weakness (generalized): Secondary | ICD-10-CM

## 2016-10-07 DIAGNOSIS — R262 Difficulty in walking, not elsewhere classified: Secondary | ICD-10-CM | POA: Diagnosis not present

## 2016-10-07 NOTE — Therapy (Signed)
[place] MAIN [facility] SERVICES [address] [place], [place], [address] Phone: [phone]   Fax:  [phone]  Physical Therapy Treatment  Patient Details  Name: KABIR BRANNOCK MRN: [id number] Date of Birth: 1932-01-11 Referring Provider: Vladimir Crofts   Encounter Date: 10/07/2016      PT End of Session - 10/07/16 1154    Visit Number 6   Number of Visits 17   Date for PT Re-Evaluation 11/15/16   Authorization Type 6/10 g codes   PT Start Time 1146   PT Stop Time 1226   PT Time Calculation (min) 40 min   Equipment Utilized During Treatment Gait belt   Activity Tolerance Patient tolerated treatment well;Patient limited by fatigue;Patient limited by pain   Behavior During Therapy [facility] for tasks assessed/performed      Past Medical History:  Diagnosis Date  . Anemia   . Atherosclerosis   . Complication of anesthesia    CONFUSION - Pt's family very concerned about this  . DDD (degenerative disc disease)   . Degenerative arthritis   . Dermatitis, atopic ARMS AND LEGS  . Diverticulosis   . Erectile dysfunction   . Frequency of urination   . Glaucoma   . History of asbestos exposure   . History of radiation therapy 8/19-13-10/18/11   prostate, 89 GY  . History of shingles 2012-- BILATERAL EYES--  NO RESIDUAL  . Hyperlipidemia   . Hypertension   . Inguinal hernia   . Lumbar scoliosis   . Nocturia   . OA (osteoarthritis) of hip RIGHT  . Prostate cancer ([place]) 05/11/2011   bx=Adenocarcinoma,gleason3+4=7, 4+4=8,PSA=10.30volume=45.7cc  . Renal cyst    bilateral  . Smokers' cough ([place])   . Thyroid cyst     Past Surgical History:  Procedure Laterality Date  . ATTEMPTED LEFT VATS/ LEFT THORACOTOMY/ RESECTION OF THE ENORMOUS, PROBABLE BRONCHOGENIC CYST  01-04-2005  DR Arlyce Dice  . CARDIAC CATHETERIZATION  02-24-2009  DR NASHER   MINOR CORONARY ARTERY IRREGULARITIES/ NORMAL LVSF/ EF 65-70%  . COLONOSCOPY W/ POLYPECTOMY    . CYSTOSCOPY   11/15/2011   Procedure: CYSTOSCOPY;  Surgeon: Dutch Gray, MD;  Location: [facility];  Service: Urology;  Laterality: N/A;  no seeds seen in bladder  . esophageus cyst removal  YRS AGO  . [place]  . PROSTATE BIOPSY  05/11/11   Adenocarcinoma (MD OFFICE)  . RADIOACTIVE SEED IMPLANT  11/15/2011   Procedure: RADIOACTIVE SEED IMPLANT;  Surgeon: Dutch Gray, MD;  [facility]: [facility];  Service: Urology;  Laterality: N/A;  Total number of seeds - 52  . REPAIR RIGHT INGUINAL HERNIA W/ MESH  09-10-1999  . TOTAL HIP ARTHROPLASTY Right 03/20/2013   Procedure: RIGHT TOTAL HIP ARTHROPLASTY ANTERIOR APPROACH;  Surgeon: [name], MD;  Location: [facility];  Service: Orthopedics;  Laterality: Right;  . TOTAL HIP REVISION Right 05/14/2013   Procedure: open reduction internal fixation REVISION RIGHT HIP ;  Surgeon: [name], MD;  Location: [facility];  Service: Orthopedics;  Laterality: Right;  . UMBILICAL HERNIA REPAIR  1998   epigastric    There were no vitals filed for this visit.      Subjective Assessment - 10/07/16 1152    Subjective Pt reports that he is feeling good overall today.  He states that he continues to have intermittent pain in his L LE, especially when he is walking.  He also states that he feels his L  LE is weak and he "doesn't trust it as much".   Patient is accompained by: Family member   Pertinent History Patient an having dizziness and gait disturbance in 2015 and it has not gotten better. He had the right hip replaced in 2015 . He is having LLE leg giving away and he started falling. Now he can not always  bear weight on his leg.     Limitations Standing;Walking   How long can you stand comfortably? unknown , vairable   How long can you walk comfortably? unknown , variable   Currently in Pain? Yes   Pain Score 5    Pain Location Leg   Pain Orientation Left   Pain Descriptors / Indicators Aching   Pain Onset More than a month  ago   Multiple Pain Sites No      Treatment:  NuStep warm up x 5 mins  Gait training with Rw 300 x 2 feet with supervision - cues needed for proper heel strike  Standing hip abd x 20 BLE , cues for posture correction   Standing hip extension x 20 BLE cues to perform full ROM and to keep knee in extension  Toe Taps to 6 in step x 20 B LE - demonstration and cues needed for proper form  Standing heel raises x 20    Step ups to 6 inch stool fwd  X 20 BLE  Card tapping on foam pad x 20 each direction w/CGA and cueing for technique  Side Stepping on foam x 3 laps w/o UE support and cues for proper form.   Patient needs demonstration of exercises and cuing for exercise technique; decreased cueing frequency needed during exercises today.                           PT Education - 10/07/16 1153    Education provided Yes   Education Details Continueing to exercise at home; HEP progression   Person(s) Educated Patient   Methods Explanation   Comprehension Verbalized understanding          PT Short Term Goals - 09/21/16 1136      PT SHORT TERM GOAL #1   Title Patient will complete a sit to stand transfer with proper placement of hands and with out loss of balance to be safer and reduce his falls risk   Time 4   Period Weeks   Status New   Target Date 10/19/16     PT SHORT TERM GOAL #2   Title Patient will increase BLE gross strength to 3/5 as to improve functional strength for independent gait, increased standing tolerance and increased ADL ability.   Time 4   Period Weeks   Status New   Target Date 10/19/16           PT Long Term Goals - 09/21/16 1134      PT LONG TERM GOAL #1   Title Patient will be independent in home exercise program to improve strength/mobility for better functional independence with ADLs.   Time 8   Period Weeks   Status New   Target Date 11/16/16     PT LONG TERM GOAL #2   Title Patient (< 49 years old)  will complete five times sit to stand test in < 10 seconds indicating an increased LE strength and improved balance.   Time 8   Period Weeks   Status New   Target Date 11/16/16  PT LONG TERM GOAL #3   Title Patient will increase 10 meter walk test to >1.71m/s as to improve gait speed for better community ambulation and to reduce fall risk.   Time 8   Period Weeks   Status New   Target Date 11/16/16               Plan - 10/07/16 1229    Clinical Impression Statement Patient was educated on gait with RW and CGA and  ability to increase gait  distance with less fatiuge and feelings of "giving way" of his L LE. Dynamic balance activities and strengthening exercises  were progressed today with decreased rest breaks needed. Patient has decreased strength LLE hip 4/5 and did not exhibit any pain behaviors with treatment today.   Pt will continue to benefit from skilled PT to improve mobility, balance and strength deficits.   Rehab Potential Good   PT Frequency 2x / week   PT Duration 8 weeks   PT Treatment/Interventions Gait training;Functional mobility training;Neuromuscular re-education;Balance training;Therapeutic exercise;Therapeutic activities;Patient/family education   PT Next Visit Plan balance   Consulted and Agree with Plan of Care Patient      Patient will benefit from skilled therapeutic intervention in order to improve the following deficits and impairments:  Abnormal gait, Decreased balance, Decreased endurance, Decreased mobility, Difficulty walking, Decreased activity tolerance, Decreased strength, Impaired flexibility, Decreased safety awareness, Decreased knowledge of use of DME  Visit Diagnosis: Difficulty in walking, not elsewhere classified  Muscle weakness (generalized)  Pain in left leg     Problem List Patient Active Problem List   Diagnosis Date Noted  . Cigarette smoker 06/16/2016  . Dyspnea on exertion 06/15/2016  . Dementia arising in the senium  and presenium 01/07/2014  . CSA (central sleep apnea) 10/05/2013  . Syncope 06/26/2013  . COPD GOLD II 06/25/2013  . BPH (benign prostatic hyperplasia) 05/18/2013  . Glaucoma 05/18/2013  . S/P right TH revision 05/14/2013  . Constipation 03/26/2013  . Osteoporosis 03/26/2013  . Expected blood loss anemia 03/23/2013  . S/P right THA, AA 03/20/2013  . Hyperlipidemia   . Erectile dysfunction   . History of asbestos exposure   . Nocturia   . History of shingles   . Dermatitis, atopic   . OA (osteoarthritis) of hip   . Arthritis   . Frequency of urination   . Lumbar scoliosis   . Malignant neoplasm of prostate ([place]) 06/07/2011  . 83 year old gentleman with stage T3 adenocarcinoma prostate with Gleason score 4+4 and PSA of 10.3 05/11/2011   This entire session was performed under direct supervision and direction of a licensed therapist/therapist assistant . I have personally read, edited and approve of the note as written. Netta Corrigan, SPT Alanson Puls, PT, DPT  10/07/2016, 1:31 PM  [place] MAIN [facility] SERVICES [address] [place], [place], [address] Phone: [phone]   Fax:  [phone]  Name: Derek Carr MRN: [id number] Date of Birth: 09-27-1931

## 2016-10-12 ENCOUNTER — Ambulatory Visit: Payer: Medicare HMO | Admitting: Physical Therapy

## 2016-10-12 ENCOUNTER — Encounter: Payer: Self-pay | Admitting: Physical Therapy

## 2016-10-12 DIAGNOSIS — M6281 Muscle weakness (generalized): Secondary | ICD-10-CM

## 2016-10-12 DIAGNOSIS — M79605 Pain in left leg: Secondary | ICD-10-CM

## 2016-10-12 DIAGNOSIS — R262 Difficulty in walking, not elsewhere classified: Secondary | ICD-10-CM

## 2016-10-12 NOTE — Therapy (Signed)
[place] MAIN [facility] SERVICES [address] [place], [place], [address] Phone: [phone]   Fax:  [phone]  Physical Therapy Treatment  Patient Details  Name: Derek Carr MRN: [id number] Date of Birth: 1931/08/27 Referring Provider: Vladimir Crofts   Encounter Date: 10/12/2016      PT End of Session - 10/12/16 1413    Visit Number 7   Number of Visits 17   Date for PT Re-Evaluation 11/15/16   Authorization Type 7/10 g codes   PT Start Time 0200   PT Stop Time 0245   PT Time Calculation (min) 45 min   Equipment Utilized During Treatment Gait belt   Activity Tolerance Patient tolerated treatment well;Patient limited by fatigue;Patient limited by pain   Behavior During Therapy [facility] for tasks assessed/performed      Past Medical History:  Diagnosis Date  . Anemia   . Atherosclerosis   . Complication of anesthesia    CONFUSION - Pt's family very concerned about this  . DDD (degenerative disc disease)   . Degenerative arthritis   . Dermatitis, atopic ARMS AND LEGS  . Diverticulosis   . Erectile dysfunction   . Frequency of urination   . Glaucoma   . History of asbestos exposure   . History of radiation therapy 8/19-13-10/18/11   prostate, 38 GY  . History of shingles 2012-- BILATERAL EYES--  NO RESIDUAL  . Hyperlipidemia   . Hypertension   . Inguinal hernia   . Lumbar scoliosis   . Nocturia   . OA (osteoarthritis) of hip RIGHT  . Prostate cancer ([place]) 05/11/2011   bx=Adenocarcinoma,gleason3+4=7, 4+4=8,PSA=10.30volume=45.7cc  . Renal cyst    bilateral  . Smokers' cough ([place])   . Thyroid cyst     Past Surgical History:  Procedure Laterality Date  . ATTEMPTED LEFT VATS/ LEFT THORACOTOMY/ RESECTION OF THE ENORMOUS, PROBABLE BRONCHOGENIC CYST  01-04-2005  DR Arlyce Dice  . CARDIAC CATHETERIZATION  02-24-2009  DR NASHER   MINOR CORONARY ARTERY IRREGULARITIES/ NORMAL LVSF/ EF 65-70%  . COLONOSCOPY W/ POLYPECTOMY    . CYSTOSCOPY   11/15/2011   Procedure: CYSTOSCOPY;  Surgeon: Dutch Gray, MD;  Location: [facility];  Service: Urology;  Laterality: N/A;  no seeds seen in bladder  . esophageus cyst removal  YRS AGO  . [place]  . PROSTATE BIOPSY  05/11/11   Adenocarcinoma (MD OFFICE)  . RADIOACTIVE SEED IMPLANT  11/15/2011   Procedure: RADIOACTIVE SEED IMPLANT;  Surgeon: Dutch Gray, MD;  Location: [facility];  Service: Urology;  Laterality: N/A;  Total number of seeds - 52  . REPAIR RIGHT INGUINAL HERNIA W/ MESH  09-10-1999  . TOTAL HIP ARTHROPLASTY Right 03/20/2013   Procedure: RIGHT TOTAL HIP ARTHROPLASTY ANTERIOR APPROACH;  Surgeon: [name], MD;  Location: [facility];  Service: Orthopedics;  Laterality: Right;  . TOTAL HIP REVISION Right 05/14/2013   Procedure: open reduction internal fixation REVISION RIGHT HIP ;  Surgeon: [name], MD;  Location: [facility];  Service: Orthopedics;  Laterality: Right;  . UMBILICAL HERNIA REPAIR  1998   epigastric    There were no vitals filed for this visit.      Subjective Assessment - 10/12/16 1412    Subjective Patient reports that today he feels like his left hip is weak and it might give away. He is having trouble with standing and walking.    Patient is accompained by: Family member   Pertinent  History Patient an having dizziness and gait disturbance in 2015 and it has not gotten better. He had the right hip replaced in 2015 . He is having LLE leg giving away and he started falling. Now he can not always  bear weight on his leg.     Limitations Standing;Walking   How long can you stand comfortably? unknown , vairable   How long can you walk comfortably? unknown , variable   Currently in Pain? Yes   Pain Score 6    Pain Location Groin   Pain Orientation Left   Pain Descriptors / Indicators Aching;Grimacing;Guarding;Spasm   Pain Type Chronic pain   Pain Onset More than a month ago   Pain Frequency Constant    Aggravating Factors  wallking   Effect of Pain on Daily Activities unable to stand, unable to walk   Multiple Pain Sites No     Gait training: CGA with RW 50 feet, CGA  x 3 with antalgic gait that gets better as treatment progresses and following manual therapy  Manual therapy: Long axis distraction grade 4 x 8 mins x 2 sessions  STM to left hip groin with roller stick   Therapeutic exercise:  hooklying marching x 20 , initial pain that continues  SAQ with 3 sec hold BLE x 20 , no pain with this exercise  Special tests;  + FABER left and right  - scour test left and right  Patient initially has pain with raising up LLE to mat level to lie down and pain with stretching left leg straight out on the mat into extension and pain with left leg flex  Following therapy he has 0/10 pain to left hip  during ambulation . He is able to get on and off the mat table with better mobility and no assistance. He ambulates with Rw without assist following therapy.                             PT Education - 10/12/16 1413    Education provided Yes   Education Details continue HEP   Person(s) Educated Patient   Methods Explanation;Demonstration   Comprehension Verbalized understanding;Returned demonstration          PT Short Term Goals - 09/21/16 1136      PT SHORT TERM GOAL #1   Title Patient will complete a sit to stand transfer with proper placement of hands and with out loss of balance to be safer and reduce his falls risk   Time 4   Period Weeks   Status New   Target Date 10/19/16     PT SHORT TERM GOAL #2   Title Patient will increase BLE gross strength to 3/5 as to improve functional strength for independent gait, increased standing tolerance and increased ADL ability.   Time 4   Period Weeks   Status New   Target Date 10/19/16           PT Long Term Goals - 09/21/16 1134      PT LONG TERM GOAL #1   Title Patient will be independent in home  exercise program to improve strength/mobility for better functional independence with ADLs.   Time 8   Period Weeks   Status New   Target Date 11/16/16     PT LONG TERM GOAL #2   Title Patient (< 59 years old) will complete five times sit to stand test in < 10 seconds indicating  an increased LE strength and improved balance.   Time 8   Period Weeks   Status New   Target Date 11/16/16     PT LONG TERM GOAL #3   Title Patient will increase 10 meter walk test to >1.63m/s as to improve gait speed for better community ambulation and to reduce fall risk.   Time 8   Period Weeks   Status New   Target Date 11/16/16               Plan - 10/12/16 1453    Clinical Impression Statement Patient presents with pain to left hip 6/10 and antalgic gait with RW. He has weakness today in LLE hip -3/5 flex, abd-3/5. He has + FABER BLE  and tenderness to palpation left groin musculature. He ambuates with RW slow gait speed and limp 50 feet CGA  x 3. He responds to manual therapy ,including long axis distraction left hip and STM to left groin with 0/10 following.  Patient performs hooklying marching , SAQ prior to [place] therapy. Following therapy he is able to ambualte with RW without pain and without feeling like it will give away. He will continue to benefit from skilled PT to improve mobiltiy and strength.    Rehab Potential Good   PT Frequency 2x / week   PT Duration 8 weeks   PT Treatment/Interventions Gait training;Functional mobility training;Neuromuscular re-education;Balance training;Therapeutic exercise;Therapeutic activities;Patient/family education   PT Next Visit Plan balance   Consulted and Agree with Plan of Care Patient      Patient will benefit from skilled therapeutic intervention in order to improve the following deficits and impairments:  Abnormal gait, Decreased balance, Decreased endurance, Decreased mobility, Difficulty walking, Decreased activity tolerance, Decreased  strength, Impaired flexibility, Decreased safety awareness, Decreased knowledge of use of DME  Visit Diagnosis: Difficulty in walking, not elsewhere classified  Muscle weakness (generalized)  Pain in left leg     Problem List Patient Active Problem List   Diagnosis Date Noted  . Cigarette smoker 06/16/2016  . Dyspnea on exertion 06/15/2016  . Dementia arising in the senium and presenium 01/07/2014  . CSA (central sleep apnea) 10/05/2013  . Syncope 06/26/2013  . COPD GOLD II 06/25/2013  . BPH (benign prostatic hyperplasia) 05/18/2013  . Glaucoma 05/18/2013  . S/P right TH revision 05/14/2013  . Constipation 03/26/2013  . Osteoporosis 03/26/2013  . Expected blood loss anemia 03/23/2013  . S/P right THA, AA 03/20/2013  . Hyperlipidemia   . Erectile dysfunction   . History of asbestos exposure   . Nocturia   . History of shingles   . Dermatitis, atopic   . OA (osteoarthritis) of hip   . Arthritis   . Frequency of urination   . Lumbar scoliosis   . Malignant neoplasm of prostate ([place]) 06/07/2011  . 83 year old gentleman with stage T3 adenocarcinoma prostate with Gleason score 4+4 and PSA of 10.3 05/11/2011    Alanson Puls, [place] DPT 10/12/2016, 3:19 PM  [place] MAIN [facility] SERVICES [address] [place], [place], [address] Phone: [phone]   Fax:  [phone]  Name: Derek Carr MRN: [id number] Date of Birth: December 28, 1931

## 2016-10-14 ENCOUNTER — Encounter: Payer: Self-pay | Admitting: Physical Therapy

## 2016-10-14 ENCOUNTER — Ambulatory Visit: Payer: Medicare HMO | Admitting: Physical Therapy

## 2016-10-14 DIAGNOSIS — R262 Difficulty in walking, not elsewhere classified: Secondary | ICD-10-CM

## 2016-10-14 DIAGNOSIS — M6281 Muscle weakness (generalized): Secondary | ICD-10-CM

## 2016-10-14 DIAGNOSIS — M79605 Pain in left leg: Secondary | ICD-10-CM

## 2016-10-14 NOTE — Therapy (Signed)
[place] MAIN [facility] SERVICES [address] [place], [place], [address] Phone: [phone]   Fax:  [phone]  Physical Therapy Treatment  Patient Details  Name: Derek Carr MRN: [id number] Date of Birth: 1931/11/24 Referring Provider: Vladimir Crofts   Encounter Date: 10/14/2016      PT End of Session - 10/14/16 1548    Visit Number 8   Number of Visits 17   Date for PT Re-Evaluation 11/15/16   Authorization Type 8/10 g codes   PT Start Time 0346   PT Stop Time 0430   PT Time Calculation (min) 44 min   Equipment Utilized During Treatment Gait belt   Activity Tolerance Patient tolerated treatment well   Behavior During Therapy [facility] for tasks assessed/performed      Past Medical History:  Diagnosis Date  . Anemia   . Atherosclerosis   . Complication of anesthesia    CONFUSION - Pt's family very concerned about this  . DDD (degenerative disc disease)   . Degenerative arthritis   . Dermatitis, atopic ARMS AND LEGS  . Diverticulosis   . Erectile dysfunction   . Frequency of urination   . Glaucoma   . History of asbestos exposure   . History of radiation therapy 8/19-13-10/18/11   prostate, 48 GY  . History of shingles 2012-- BILATERAL EYES--  NO RESIDUAL  . Hyperlipidemia   . Hypertension   . Inguinal hernia   . Lumbar scoliosis   . Nocturia   . OA (osteoarthritis) of hip RIGHT  . Prostate cancer ([place]) 05/11/2011   bx=Adenocarcinoma,gleason3+4=7, 4+4=8,PSA=10.30volume=45.7cc  . Renal cyst    bilateral  . Smokers' cough ([place])   . Thyroid cyst     Past Surgical History:  Procedure Laterality Date  . ATTEMPTED LEFT VATS/ LEFT THORACOTOMY/ RESECTION OF THE ENORMOUS, PROBABLE BRONCHOGENIC CYST  01-04-2005  DR Arlyce Dice  . CARDIAC CATHETERIZATION  02-24-2009  DR NASHER   MINOR CORONARY ARTERY IRREGULARITIES/ NORMAL LVSF/ EF 65-70%  . COLONOSCOPY W/ POLYPECTOMY    . CYSTOSCOPY  11/15/2011   Procedure: CYSTOSCOPY;  Surgeon:  Dutch Gray, MD;  Location: [facility];  Service: Urology;  Laterality: N/A;  no seeds seen in bladder  . esophageus cyst removal  YRS AGO  . [place]  . PROSTATE BIOPSY  05/11/11   Adenocarcinoma (MD OFFICE)  . RADIOACTIVE SEED IMPLANT  11/15/2011   Procedure: RADIOACTIVE SEED IMPLANT;  Surgeon: Dutch Gray, MD;  Location: [facility];  Service: Urology;  Laterality: N/A;  Total number of seeds - 52  . REPAIR RIGHT INGUINAL HERNIA W/ MESH  09-10-1999  . TOTAL HIP ARTHROPLASTY Right 03/20/2013   Procedure: RIGHT TOTAL HIP ARTHROPLASTY ANTERIOR APPROACH;  Surgeon: [name], MD;  Location: [facility];  Service: Orthopedics;  Laterality: Right;  . TOTAL HIP REVISION Right 05/14/2013   Procedure: open reduction internal fixation REVISION RIGHT HIP ;  Surgeon: [name], MD;  Location: [facility];  Service: Orthopedics;  Laterality: Right;  . UMBILICAL HERNIA REPAIR  1998   epigastric    There were no vitals filed for this visit.      Subjective Assessment - 10/14/16 1546    Subjective Pt reports that he is feeling much better today compared to last session.  He denies having pain, but states he has some soreness on the lateral aspect of he L quad.  He states that he has been doing his exercises at  home.   Patient is accompained by: Family member   Pertinent History Patient an having dizziness and gait disturbance in 2015 and it has not gotten better. He had the right hip replaced in 2015 . He is having LLE leg giving away and he started falling. Now he can not always  bear weight on his leg.     Limitations Standing;Walking   How long can you stand comfortably? unknown , vairable   How long can you walk comfortably? unknown , variable   Currently in Pain? No/denies   Pain Score 0-No pain   Pain Location Leg   Pain Orientation Left   Pain Descriptors / Indicators Sore   Pain Onset More than a month ago      Treatment: Octane fitness  warmup x 5 min  Supine SLR 2 x 10 B LE's - cues for form  Sidelying hip ABD x 20 B LE's   Long axis distraction to L LE: in neutral 2 x 30 sec bouts, in ABD 2 x 30 sec bouts  Side stepping with red theraband x 5 laps in // bars - cues for proper form and exercise sequence  Monster walks with red theraband x 3 laps in // bars - visual cues using cones for foot placement, verbal cues for technique  Toe taps to 6 in step x 20 B LE's   Standing heel raises x 20 B LE's  Lunges into BOSU x 20 B LE's - cues for form with exercise  Standing marches with 2# ankle weight x 20 B LE's - cues for proper height during exercise   Standing hip ext with 2# ankle weight x 20 B LE's - cues for technique   Lateral stepovers with 6 in step using B UE support x 10 reps - cues for proper stepping pattern; 1 standing rest break during set   Step downs from 6 in step using B UE support x 10 reps B LE's - cues for proper pattern and technique; no rest breaks needed during set   No increase in pain of L LE noted during exercises in today's session.  Pt denies any catching sensations of L LE.  Improved mobility noted with transfers and standing activities today.                             PT Education - 10/14/16 1547    Education provided Yes   Education Details Coninuation of HEP   Person(s) Educated Patient   Methods Explanation   Comprehension Verbalized understanding;Returned demonstration          PT Short Term Goals - 09/21/16 1136      PT SHORT TERM GOAL #1   Title Patient will complete a sit to stand transfer with proper placement of hands and with out loss of balance to be safer and reduce his falls risk   Time 4   Period Weeks   Status New   Target Date 10/19/16     PT SHORT TERM GOAL #2   Title Patient will increase BLE gross strength to 3/5 as to improve functional strength for independent gait, increased standing tolerance and increased ADL ability.    Time 4   Period Weeks   Status New   Target Date 10/19/16           PT Long Term Goals - 09/21/16 1134      PT LONG TERM GOAL #1  Title Patient will be independent in home exercise program to improve strength/mobility for better functional independence with ADLs.   Time 8   Period Weeks   Status New   Target Date 11/16/16     PT LONG TERM GOAL #2   Title Patient (< 45 years old) will complete five times sit to stand test in < 10 seconds indicating an increased LE strength and improved balance.   Time 8   Period Weeks   Status New   Target Date 11/16/16     PT LONG TERM GOAL #3   Title Patient will increase 10 meter walk test to >1.70m/s as to improve gait speed for better community ambulation and to reduce fall risk.   Time 8   Period Weeks   Status New   Target Date 11/16/16               Plan - 10/14/16 1634    Clinical Impression Statement Pt presents today with no pain and improved gait pattern in comparison to last session.  Pt was able to complete all exercsies without increase in pain and no "catching" sensations of the L LE.  Strength exercises were done today with increased rest breaks provided (3 seated rest breaks during today's session) in order to ease back into strength program.  Pt was encouraged to continue HEP at home and to alert PT of any onset similar to weakness and pain from earlier in the week.  Pt would continue to benefit from skilled therapy services in order to address LE strength deficits and improve overall mobility.   Rehab Potential Good   PT Frequency 2x / week   PT Duration 8 weeks   PT Treatment/Interventions Gait training;Functional mobility training;Neuromuscular re-education;Balance training;Therapeutic exercise;Therapeutic activities;Patient/family education   PT Next Visit Plan assess goals and outcome measures next session    PT Home Exercise Plan continue HEP as prescribed    Consulted and Agree with Plan of Care Patient       Patient will benefit from skilled therapeutic intervention in order to improve the following deficits and impairments:  Abnormal gait, Decreased balance, Decreased endurance, Decreased mobility, Difficulty walking, Decreased activity tolerance, Decreased strength, Impaired flexibility, Decreased safety awareness, Decreased knowledge of use of DME  Visit Diagnosis: Difficulty in walking, not elsewhere classified  Muscle weakness (generalized)  Pain in left leg     Problem List Patient Active Problem List   Diagnosis Date Noted  . Cigarette smoker 06/16/2016  . Dyspnea on exertion 06/15/2016  . Dementia arising in the senium and presenium 01/07/2014  . CSA (central sleep apnea) 10/05/2013  . Syncope 06/26/2013  . COPD GOLD II 06/25/2013  . BPH (benign prostatic hyperplasia) 05/18/2013  . Glaucoma 05/18/2013  . S/P right TH revision 05/14/2013  . Constipation 03/26/2013  . Osteoporosis 03/26/2013  . Expected blood loss anemia 03/23/2013  . S/P right THA, AA 03/20/2013  . Hyperlipidemia   . Erectile dysfunction   . History of asbestos exposure   . Nocturia   . History of shingles   . Dermatitis, atopic   . OA (osteoarthritis) of hip   . Arthritis   . Frequency of urination   . Lumbar scoliosis   . Malignant neoplasm of prostate ([place]) 06/07/2011  . 83 year old gentleman with stage T3 adenocarcinoma prostate with Gleason score 4+4 and PSA of 10.3 05/11/2011  This entire session was performed under direct supervision and direction of a licensed therapist/therapist assistant . I have personally read, edited  and approve of the note as written. Netta Corrigan, SPT Alanson Puls, PT, DPT  10/14/2016, 4:39 PM  [place] MAIN [facility] SERVICES [address] [place], [place], [address] Phone: [phone]   Fax:  [phone]  Name: DETRON CARRAS MRN: [id number] Date of Birth: [birth year]

## 2016-10-19 ENCOUNTER — Ambulatory Visit: Payer: Medicare HMO | Admitting: Physical Therapy

## 2016-10-19 ENCOUNTER — Encounter: Payer: Self-pay | Admitting: Physical Therapy

## 2016-10-19 DIAGNOSIS — M79605 Pain in left leg: Secondary | ICD-10-CM

## 2016-10-19 DIAGNOSIS — M6281 Muscle weakness (generalized): Secondary | ICD-10-CM

## 2016-10-19 DIAGNOSIS — R262 Difficulty in walking, not elsewhere classified: Secondary | ICD-10-CM

## 2016-10-19 NOTE — Therapy (Addendum)
[place] MAIN [facility] SERVICES [address] [place], [place], [address] Phone: [phone]   Fax:  [phone]  Physical Therapy Treatment  Patient Details  Name: Derek Carr MRN: [id number] Date of Birth: 1931/02/07 Referring Provider: Vladimir Crofts   Encounter Date: 10/19/2016      PT End of Session - 10/19/16 1547    Visit Number 9   Number of Visits 17   Date for PT Re-Evaluation 11/15/16   Authorization Type 9/10 g codes   PT Start Time 0335   PT Stop Time 0415   PT Time Calculation (min) 40 min   Equipment Utilized During Treatment Gait belt   Activity Tolerance Patient tolerated treatment well   Behavior During Therapy [facility] for tasks assessed/performed      Past Medical History:  Diagnosis Date  . Anemia   . Atherosclerosis   . Complication of anesthesia    CONFUSION - Pt's family very concerned about this  . DDD (degenerative disc disease)   . Degenerative arthritis   . Dermatitis, atopic ARMS AND LEGS  . Diverticulosis   . Erectile dysfunction   . Frequency of urination   . Glaucoma   . History of asbestos exposure   . History of radiation therapy 8/19-13-10/18/11   prostate, 82 GY  . History of shingles 2012-- BILATERAL EYES--  NO RESIDUAL  . Hyperlipidemia   . Hypertension   . Inguinal hernia   . Lumbar scoliosis   . Nocturia   . OA (osteoarthritis) of hip RIGHT  . Prostate cancer ([place]) 05/11/2011   bx=Adenocarcinoma,gleason3+4=7, 4+4=8,PSA=10.30volume=45.7cc  . Renal cyst    bilateral  . Smokers' cough ([place])   . Thyroid cyst     Past Surgical History:  Procedure Laterality Date  . ATTEMPTED LEFT VATS/ LEFT THORACOTOMY/ RESECTION OF THE ENORMOUS, PROBABLE BRONCHOGENIC CYST  01-04-2005  DR Arlyce Dice  . CARDIAC CATHETERIZATION  02-24-2009  DR NASHER   MINOR CORONARY ARTERY IRREGULARITIES/ NORMAL LVSF/ EF 65-70%  . COLONOSCOPY W/ POLYPECTOMY    . CYSTOSCOPY  11/15/2011   Procedure: CYSTOSCOPY;  Surgeon:  Dutch Gray, MD;  Location: [facility];  Service: Urology;  Laterality: N/A;  no seeds seen in bladder  . esophageus cyst removal  YRS AGO  . [place]  . PROSTATE BIOPSY  05/11/11   Adenocarcinoma (MD OFFICE)  . RADIOACTIVE SEED IMPLANT  11/15/2011   Procedure: RADIOACTIVE SEED IMPLANT;  Surgeon: Dutch Gray, MD;  Location: [facility];  Service: Urology;  Laterality: N/A;  Total number of seeds - 52  . REPAIR RIGHT INGUINAL HERNIA W/ MESH  09-10-1999  . TOTAL HIP ARTHROPLASTY Right 03/20/2013   Procedure: RIGHT TOTAL HIP ARTHROPLASTY ANTERIOR APPROACH;  Surgeon: [name], MD;  [facility];  Service: Orthopedics;  Laterality: Right;  . TOTAL HIP REVISION Right 05/14/2013   Procedure: open reduction internal fixation REVISION RIGHT HIP ;  Surgeon: [name], MD;  Location: [facility];  Service: Orthopedics;  Laterality: Right;  . UMBILICAL HERNIA REPAIR  1998   epigastric    There were no vitals filed for this visit.      Subjective Assessment - 10/19/16 1545    Subjective Pt states that overall he feels good today, but states that he is having increased soreness and notes that he feels his L LE is weak.  He states that there is a spot on his L lateral thigh that increases in pain  and sometimes has shooting sensations into his L hip.  Pt reports that he has not fallen in the last week, but states that he feels weak and is afraid to trust his L LE to support him.  He notes that he had increased soreness over the weekend and believes it was weather related.   Patient is accompained by: Family member   Pertinent History Patient an having dizziness and gait disturbance in 2015 and it has not gotten better. He had the right hip replaced in 2015 . He is having LLE leg giving away and he started falling. Now he can not always  bear weight on his leg.     Limitations Standing;Walking   How long can you stand comfortably? unknown , vairable    How long can you walk comfortably? unknown , variable   Currently in Pain? Yes   Pain Score 5    Pain Location Leg   Pain Orientation Left   Pain Descriptors / Indicators Sore;Shooting   Pain Onset More than a month ago    Treatment:  There ex: Octane fitness x 5 min warmup SLR x 20 B LE Supine bridges x 20 Sidelying ABD x 20 B LE  Manual: Long axis distraction 3 bouts of 30 sec in neutral and ABD of L LE - increased tightness noticed with passive motion into ABD on L Roller to L ABD and IT band - palpable trigger point at proximal aspect of lateral L thigh, decreased symptoms noted with palpation after manual intervention   Pt and family educated about manual interventions and aspects of strength training.                          PT Education - 10/19/16 1547    Education provided Yes   Education Details HEP; keeping journal of pain and symptoms of LE weakness    Person(s) Educated Patient   Methods Explanation   Comprehension Verbalized understanding          PT Short Term Goals - 10/19/16 1715      PT SHORT TERM GOAL #1   Title Patient will complete a sit to stand transfer with proper placement of hands and with out loss of balance to be safer and reduce his falls risk   Time 4   Period Weeks   Status Achieved     PT SHORT TERM GOAL #2   Title Patient will increase BLE gross strength to 3/5 as to improve functional strength for independent gait, increased standing tolerance and increased ADL ability.   Time 4   Period Weeks   Status Achieved           PT Long Term Goals - 10/19/16 1716      PT LONG TERM GOAL #1   Title Patient will be independent in home exercise program to improve strength/mobility for better functional independence with ADLs.   Time 8   Period Weeks   Status On-going   Target Date 11/16/16     PT LONG TERM GOAL #2   Title Patient (< 62 years old) will complete five times sit to stand test in < 10 seconds  indicating an increased LE strength and improved balance.   Time 8   Period Weeks   Status On-going   Target Date 11/16/16     PT LONG TERM GOAL #3   Title Patient will increase 10 meter walk test to >1.20m/s as to improve gait  speed for better community ambulation and to reduce fall risk.   Time 8   Period Weeks   Status On-going   Target Date 11/16/16               Plan - 10/19/16 1710    Clinical Impression Statement Pt presents today with increased soreness of L LE at the lateral thigh, IT band region.  Pt had palpable trigger point along the lateral aspect of his thigh, noting increased symptoms with compression and palpation of the region.  Decreased symptoms noted after manual interventions today.  Pt continues to have increased weakness of L LE and fear of falling as a result, describing "catching" sensations at times.  Pt stands with decreased weight bearing on L LE secondary to weakness and fear of trusting his leg to support him.  Manual interventions were performed to decrease pain and trigger points of the L LE, followed by supine strengthening exercises.  Pt noted improved pain from 5/10 at start of session to 4/10 at end of session.  Pt, daughter and wife were educated to keep record of pain and increased weakness symptoms in order to pinpoint when he feels more unsteady and when his pain is elevated.  Pt would benefit from further skilled therapy interventions in order to address continued weakness of LE's and decreased mobility in order to decrease risk and fear of falling.   Rehab Potential Good   PT Frequency 2x / week   PT Duration 8 weeks   PT Treatment/Interventions Gait training;Functional mobility training;Neuromuscular re-education;Balance training;Therapeutic exercise;Therapeutic activities;Patient/family education   PT Next Visit Plan Look at goals and outcome measures next session   PT Home Exercise Plan continue HEP as prescribed    Consulted and Agree with  Plan of Care Patient      Patient will benefit from skilled therapeutic intervention in order to improve the following deficits and impairments:  Abnormal gait, Decreased balance, Decreased endurance, Decreased mobility, Difficulty walking, Decreased activity tolerance, Decreased strength, Impaired flexibility, Decreased safety awareness, Decreased knowledge of use of DME  Visit Diagnosis: Difficulty in walking, not elsewhere classified  Muscle weakness (generalized)  Pain in left leg     Problem List Patient Active Problem List   Diagnosis Date Noted  . Cigarette smoker 06/16/2016  . Dyspnea on exertion 06/15/2016  . Dementia arising in the senium and presenium 01/07/2014  . CSA (central sleep apnea) 10/05/2013  . Syncope 06/26/2013  . COPD GOLD II 06/25/2013  . BPH (benign prostatic hyperplasia) 05/18/2013  . Glaucoma 05/18/2013  . S/P right TH revision 05/14/2013  . Constipation 03/26/2013  . Osteoporosis 03/26/2013  . Expected blood loss anemia 03/23/2013  . S/P right THA, AA 03/20/2013  . Hyperlipidemia   . Erectile dysfunction   . History of asbestos exposure   . Nocturia   . History of shingles   . Dermatitis, atopic   . OA (osteoarthritis) of hip   . Arthritis   . Frequency of urination   . Lumbar scoliosis   . Malignant neoplasm of prostate ([place]) 06/07/2011  . 83 year old gentleman with stage T3 adenocarcinoma prostate with Gleason score 4+4 and PSA of 10.3 05/11/2011   This entire session was performed under direct supervision and direction of a licensed therapist/therapist assistant . I have personally read, edited and approve of the note as written. Netta Corrigan, SPT Alanson Puls, PT, DPT  10/20/2016, 9:40 AM  [place] MAIN [facility] SERVICES [address]  [place], [place], [address] Phone: [phone]   Fax:  [phone]  Name: Derek Carr MRN: [id number] Date of Birth: 05/18/1931

## 2016-10-20 DIAGNOSIS — R69 Illness, unspecified: Secondary | ICD-10-CM | POA: Diagnosis not present

## 2016-10-21 ENCOUNTER — Ambulatory Visit: Payer: Medicare HMO | Admitting: Physical Therapy

## 2016-10-21 ENCOUNTER — Encounter: Payer: Self-pay | Admitting: Physical Therapy

## 2016-10-21 DIAGNOSIS — M6281 Muscle weakness (generalized): Secondary | ICD-10-CM

## 2016-10-21 DIAGNOSIS — R262 Difficulty in walking, not elsewhere classified: Secondary | ICD-10-CM | POA: Diagnosis not present

## 2016-10-21 DIAGNOSIS — M79605 Pain in left leg: Secondary | ICD-10-CM

## 2016-10-21 NOTE — Therapy (Signed)
[place] MAIN [facility] SERVICES [address] [place], [place], [address] Phone: [phone]   Fax:  [phone]  Physical Therapy Treatment  Patient Details  Name: Derek Carr MRN: [id number] Date of Birth: [birth year] Referring Provider: Vladimir Crofts   Encounter Date: 10/21/2016      PT End of Session - 10/21/16 1503    Visit Number 10   Number of Visits 17   Date for PT Re-Evaluation 11/15/16   Authorization Type 10/10 g codes   PT Start Time 0245   PT Stop Time 0330   PT Time Calculation (min) 45 min   Equipment Utilized During Treatment Gait belt   Activity Tolerance Patient tolerated treatment well   Behavior During Therapy [facility] for tasks assessed/performed      Past Medical History:  Diagnosis Date  . Anemia   . Atherosclerosis   . Complication of anesthesia    CONFUSION - Pt's family very concerned about this  . DDD (degenerative disc disease)   . Degenerative arthritis   . Dermatitis, atopic ARMS AND LEGS  . Diverticulosis   . Erectile dysfunction   . Frequency of urination   . Glaucoma   . History of asbestos exposure   . History of radiation therapy 8/19-13-10/18/11   prostate, 78 GY  . History of shingles 2012-- BILATERAL EYES--  NO RESIDUAL  . Hyperlipidemia   . Hypertension   . Inguinal hernia   . Lumbar scoliosis   . Nocturia   . OA (osteoarthritis) of hip RIGHT  . Prostate cancer ([place]) 05/11/2011   bx=Adenocarcinoma,gleason3+4=7, 4+4=8,PSA=10.30volume=45.7cc  . Renal cyst    bilateral  . Smokers' cough ([place])   . Thyroid cyst     Past Surgical History:  Procedure Laterality Date  . ATTEMPTED LEFT VATS/ LEFT THORACOTOMY/ RESECTION OF THE ENORMOUS, PROBABLE BRONCHOGENIC CYST  01-04-2005  DR Arlyce Dice  . CARDIAC CATHETERIZATION  02-24-2009  DR NASHER   MINOR CORONARY ARTERY IRREGULARITIES/ NORMAL LVSF/ EF 65-70%  . COLONOSCOPY W/ POLYPECTOMY    . CYSTOSCOPY  11/15/2011   Procedure: CYSTOSCOPY;  Surgeon:  Dutch Gray, MD;  Location: [facility];  Service: Urology;  Laterality: N/A;  no seeds seen in bladder  . esophageus cyst removal  YRS AGO  . [place]  . PROSTATE BIOPSY  05/11/11   Adenocarcinoma (MD OFFICE)  . RADIOACTIVE SEED IMPLANT  11/15/2011   Procedure: RADIOACTIVE SEED IMPLANT;  Surgeon: Dutch Gray, MD;  Location: [facility];  Service: Urology;  Laterality: N/A;  Total number of seeds - 52  . REPAIR RIGHT INGUINAL HERNIA W/ MESH  09-10-1999  . TOTAL HIP ARTHROPLASTY Right 03/20/2013   Procedure: RIGHT TOTAL HIP ARTHROPLASTY ANTERIOR APPROACH;  Surgeon: [name], MD;  Location: [facility];  Service: Orthopedics;  Laterality: Right;  . TOTAL HIP REVISION Right 05/14/2013   Procedure: open reduction internal fixation REVISION RIGHT HIP ;  Surgeon: [name], MD;  Location: [facility];  Service: Orthopedics;  Laterality: Right;  . UMBILICAL HERNIA REPAIR  1998   epigastric    There were no vitals filed for this visit.      Subjective Assessment - 10/21/16 1444    Subjective (P)  Pt states that overall he feels good today, but states that he is having increased soreness and notes that he feels his L LE is weak.  He states that there is a spot on his L lateral thigh that increases  in pain and sometimes has shooting sensations into his L hip.  Pt reports that he has not fallen in the last week, but states that he feels weak and is afraid to trust his L LE to support him.  He notes that he had increased soreness over the weekend and believes it was weather related.   Patient is accompained by: (P)  Family member   Pertinent History (P)  Patient an having dizziness and gait disturbance in 2015 and it has not gotten better. He had the right hip replaced in 2015 . He is having LLE leg giving away and he started falling. Now he can not always  bear weight on his leg.     Limitations (P)  Standing;Walking   How long can you stand comfortably? (P)   unknown , vairable   How long can you walk comfortably? (P)  unknown , variable   Pain Onset (P)  More than a month ago      Treatment:  Gait training with Rw 1000 feet and CGA, WC following; better gait speed, better posture  Standing hip abd x 15 BLE , cues for posture correction   Standing hip extension x 15 BLE cues to perform full ROM  Squats x 15 , 5 sec hold ,   Heel raises x 20, cues to keep his  Heels on the floor  Step over 1/2 foam fwd/ bwd x 20  Step over 1/2 foam side to side x 15   Side stepping left and right x 10 feet x 5 with YTB around knees  [employer] fitness x 5 mins    Patient required consistent cueing to perform exercises slowly in order to achieve proper form and desired outcome of the exercise. Patient required CGA for all dynamic standing balance activity.                         PT Education - 10/21/16 1503    Education provided Yes   Education Details Hep progression   Person(s) Educated Patient   Methods Explanation;Demonstration   Comprehension Verbalized understanding;Returned demonstration          PT Short Term Goals - 10/19/16 1715      PT SHORT TERM GOAL #1   Title Patient will complete a sit to stand transfer with proper placement of hands and with out loss of balance to be safer and reduce his falls risk   Time 4   Period Weeks   Status Achieved     PT SHORT TERM GOAL #2   Title Patient will increase BLE gross strength to 3/5 as to improve functional strength for independent gait, increased standing tolerance and increased ADL ability.   Time 4   Period Weeks   Status Achieved           PT Long Term Goals - 10/21/16 1505      PT LONG TERM GOAL #1   Title Patient will be independent in home exercise program to improve strength/mobility for better functional independence with ADLs.   Time 8   Period Weeks   Status On-going     PT LONG TERM GOAL #2   Title Patient (< 51 years old) will  complete five times sit to stand test in < 10 seconds indicating an increased LE strength and improved balance.   Time 8   Period Weeks   Status On-going     PT LONG TERM GOAL #3  Title Patient will increase 10 meter walk test to >1.38m/s as to improve gait speed for better community ambulation and to reduce fall risk.   Time 8   Period Weeks   Status On-going               Plan - 10/24/16 1504    Clinical Impression Statement Patient has no pain today and is able to participate in gait training and strengthening activities better. Patient required min verbal cues to perform 3 way hip with red theraband correctly and required verbal and tactile cues during all dynamic standing balance activities. Patient demonstrated decreased gait speed, decreased step height, and decreased step length. Patient will continue to benefit from skilled therapy in order to improve dynamic standing balance and increase gait speed  to reduce risk for falls   Clinical Presentation Unstable   Clinical Decision Making High   Rehab Potential Good   PT Frequency 2x / week   PT Duration 8 weeks   PT Treatment/Interventions Gait training;Functional mobility training;Neuromuscular re-education;Balance training;Therapeutic exercise;Therapeutic activities;Patient/family education   PT Next Visit Plan Look at goals and outcome measures next session   PT Home Exercise Plan continue HEP as prescribed    Consulted and Agree with Plan of Care Patient      Patient will benefit from skilled therapeutic intervention in order to improve the following deficits and impairments:  Abnormal gait, Decreased balance, Decreased endurance, Decreased mobility, Difficulty walking, Decreased activity tolerance, Decreased strength, Impaired flexibility, Decreased safety awareness, Decreased knowledge of use of DME  Visit Diagnosis: Difficulty in walking, not elsewhere classified  Muscle weakness (generalized)  Pain in left  leg       G-Codes - 10-24-2016 1507    Functional Assessment Tool Used (Outpatient Only) 5 x sit to stand, TUG, 10 MW,    Functional Limitation Mobility: Walking and moving around   Mobility: Walking and Moving Around Current Status (Q7619) At least 60 percent but less than 80 percent impaired, limited or restricted   Mobility: Walking and Moving Around Goal Status [phone]) At least 40 percent but less than 60 percent impaired, limited or restricted      Problem List Patient Active Problem List   Diagnosis Date Noted  . Cigarette smoker 06/16/2016  . Dyspnea on exertion 06/15/2016  . Dementia arising in the senium and presenium 01/07/2014  . CSA (central sleep apnea) 10/05/2013  . Syncope 06/26/2013  . COPD GOLD II 06/25/2013  . BPH (benign prostatic hyperplasia) 05/18/2013  . Glaucoma 05/18/2013  . S/P right TH revision 05/14/2013  . Constipation 03/26/2013  . Osteoporosis 03/26/2013  . Expected blood loss anemia 03/23/2013  . S/P right THA, AA 03/20/2013  . Hyperlipidemia   . Erectile dysfunction   . History of asbestos exposure   . Nocturia   . History of shingles   . Dermatitis, atopic   . OA (osteoarthritis) of hip   . Arthritis   . Frequency of urination   . Lumbar scoliosis   . Malignant neoplasm of prostate ([place]) 06/07/2011  . 84 year old gentleman with stage T3 adenocarcinoma prostate with Gleason score 4+4 and PSA of 10.3 05/11/2011    Alanson Puls, PT  DPT 10-24-2016, 3:09 PM  [place] MAIN [facility] SERVICES [place], [place], [address] Phone: [phone]   Fax:  [phone]  Name: BERWYN BIGLEY MRN: [id number] Date of Birth: 1931-02-20

## 2016-10-26 ENCOUNTER — Ambulatory Visit: Payer: Medicare HMO | Attending: Neurology | Admitting: Physical Therapy

## 2016-10-26 ENCOUNTER — Encounter: Payer: Self-pay | Admitting: Physical Therapy

## 2016-10-26 DIAGNOSIS — M6281 Muscle weakness (generalized): Secondary | ICD-10-CM | POA: Diagnosis present

## 2016-10-26 DIAGNOSIS — R262 Difficulty in walking, not elsewhere classified: Secondary | ICD-10-CM | POA: Diagnosis not present

## 2016-10-26 DIAGNOSIS — M79605 Pain in left leg: Secondary | ICD-10-CM | POA: Diagnosis present

## 2016-10-26 NOTE — Therapy (Signed)
[place] MAIN [facility] SERVICES [address] [place], [place], [address] Phone: [phone]   Fax:  [phone]  Physical Therapy Treatment  Patient Details  Name: Derek Carr MRN: [id number] Date of Birth: Jan 10, 1932 Referring Provider: Vladimir Crofts   Encounter Date: 10/26/2016      PT End of Session - 10/26/16 1416    Visit Number 11   Number of Visits 17   Date for PT Re-Evaluation 11/15/16   Authorization Type 1/10 g codes   PT Start Time 0200   PT Stop Time 0245   PT Time Calculation (min) 45 min   Equipment Utilized During Treatment Gait belt   Activity Tolerance Patient tolerated treatment well   Behavior During Therapy [facility] for tasks assessed/performed      Past Medical History:  Diagnosis Date  . Anemia   . Atherosclerosis   . Complication of anesthesia    CONFUSION - Pt's family very concerned about this  . DDD (degenerative disc disease)   . Degenerative arthritis   . Dermatitis, atopic ARMS AND LEGS  . Diverticulosis   . Erectile dysfunction   . Frequency of urination   . Glaucoma   . History of asbestos exposure   . History of radiation therapy 8/19-13-10/18/11   prostate, 88 GY  . History of shingles 2012-- BILATERAL EYES--  NO RESIDUAL  . Hyperlipidemia   . Hypertension   . Inguinal hernia   . Lumbar scoliosis   . Nocturia   . OA (osteoarthritis) of hip RIGHT  . Prostate cancer ([place]) 05/11/2011   bx=Adenocarcinoma,gleason3+4=7, 4+4=8,PSA=10.30volume=45.7cc  . Renal cyst    bilateral  . Smokers' cough ([place])   . Thyroid cyst     Past Surgical History:  Procedure Laterality Date  . ATTEMPTED LEFT VATS/ LEFT THORACOTOMY/ RESECTION OF THE ENORMOUS, PROBABLE BRONCHOGENIC CYST  01-04-2005  DR Arlyce Dice  . CARDIAC CATHETERIZATION  02-24-2009  DR NASHER   MINOR CORONARY ARTERY IRREGULARITIES/ NORMAL LVSF/ EF 65-70%  . COLONOSCOPY W/ POLYPECTOMY    . CYSTOSCOPY  11/15/2011   Procedure: CYSTOSCOPY;  Surgeon:  Dutch Gray, MD;  Location: [facility];  Service: Urology;  Laterality: N/A;  no seeds seen in bladder  . esophageus cyst removal  YRS AGO  . [place]  . PROSTATE BIOPSY  05/11/11   Adenocarcinoma (MD OFFICE)  . RADIOACTIVE SEED IMPLANT  11/15/2011   Procedure: RADIOACTIVE SEED IMPLANT;  Surgeon: Dutch Gray, MD;  Location: [facility];  Service: Urology;  Laterality: N/A;  Total number of seeds - 52  . REPAIR RIGHT INGUINAL HERNIA W/ MESH  09-10-1999  . TOTAL HIP ARTHROPLASTY Right 03/20/2013   Procedure: RIGHT TOTAL HIP ARTHROPLASTY ANTERIOR APPROACH;  Surgeon: [name], MD;  Location: [facility];  Service: Orthopedics;  Laterality: Right;  . TOTAL HIP REVISION Right 05/14/2013   Procedure: open reduction internal fixation REVISION RIGHT HIP ;  Surgeon: [name], MD;  Location: [facility];  Service: Orthopedics;  Laterality: Right;  . UMBILICAL HERNIA REPAIR  1998   epigastric    There were no vitals filed for this visit.      Subjective Assessment - 10/26/16 1412    Subjective Patient reports that he has been doing exceptionally well since his last visit. He is able to walk without the cane and wihtout the RW. He is able to jog in place.    Patient is accompained by: Family member  Pertinent History Patient an having dizziness and gait disturbance in 2015 and it has not gotten better. He had the right hip replaced in 2015 . He is having LLE leg giving away and he started falling. Now he can not always  bear weight on his leg.     Limitations Standing;Walking   How long can you stand comfortably? unknown , vairable   How long can you walk comfortably? unknown , variable   Currently in Pain? No/denies   Pain Score 0-No pain   Pain Onset More than a month ago   Multiple Pain Sites No      Treatment: Step ups to 6 inch stool x 20   Tapping stool x 6 mins x 20 with Ue support  Gait training without AD 1000 feet x 2    Ascending  and descending steps 4 x steps with railings vc for correct sequencing of BLE , instructed family in correct sequencing  LE stepping over 1/2 foam x 20   CGA and Min verbal cues used throughout  Patient performs intermediate level exercises without pain behaviors and needs verbal cuing for postural alignment and head positioning                         PT Education - 10/26/16 1415    Education provided Yes   Education Details HEp progression   Person(s) Educated Patient   Methods Explanation;Demonstration   Comprehension Verbalized understanding;Returned demonstration          PT Short Term Goals - 10/19/16 1715      PT SHORT TERM GOAL #1   Title Patient will complete a sit to stand transfer with proper placement of hands and with out loss of balance to be safer and reduce his falls risk   Time 4   Period Weeks   Status Achieved     PT SHORT TERM GOAL #2   Title Patient will increase BLE gross strength to 3/5 as to improve functional strength for independent gait, increased standing tolerance and increased ADL ability.   Time 4   Period Weeks   Status Achieved           PT Long Term Goals - 10/21/16 1505      PT LONG TERM GOAL #1   Title Patient will be independent in home exercise program to improve strength/mobility for better functional independence with ADLs.   Time 8   Period Weeks   Status On-going     PT LONG TERM GOAL #2   Title Patient (< 60 years old) will complete five times sit to stand test in < 10 seconds indicating an increased LE strength and improved balance.   Time 8   Period Weeks   Status On-going     PT LONG TERM GOAL #3   Title Patient will increase 10 meter walk test to >1.42m/s as to improve gait speed for better community ambulation and to reduce fall risk.   Time 8   Period Weeks   Status On-going               Plan - 10/26/16 1416    Clinical Impression Statement Patient is seen for gait training wirhout  AD for 1000 feet and progression of closed chain exercises in standing. He is able to increase his gait speed and  step through gait without pain behaviors and without unsteadiness. Patient will continue to benefit from skilled PT to improve giat and  strength.    Rehab Potential Good   PT Frequency 2x / week   PT Duration 8 weeks   PT Treatment/Interventions Gait training;Functional mobility training;Neuromuscular re-education;Balance training;Therapeutic exercise;Therapeutic activities;Patient/family education   PT Next Visit Plan Look at goals and outcome measures next session   PT Home Exercise Plan continue HEP as prescribed    Consulted and Agree with Plan of Care Patient      Patient will benefit from skilled therapeutic intervention in order to improve the following deficits and impairments:  Abnormal gait, Decreased balance, Decreased endurance, Decreased mobility, Difficulty walking, Decreased activity tolerance, Decreased strength, Impaired flexibility, Decreased safety awareness, Decreased knowledge of use of DME  Visit Diagnosis: Difficulty in walking, not elsewhere classified  Muscle weakness (generalized)  Pain in left leg     Problem List Patient Active Problem List   Diagnosis Date Noted  . Cigarette smoker 06/16/2016  . Dyspnea on exertion 06/15/2016  . Dementia arising in the senium and presenium 01/07/2014  . CSA (central sleep apnea) 10/05/2013  . Syncope 06/26/2013  . COPD GOLD II 06/25/2013  . BPH (benign prostatic hyperplasia) 05/18/2013  . Glaucoma 05/18/2013  . S/P right TH revision 05/14/2013  . Constipation 03/26/2013  . Osteoporosis 03/26/2013  . Expected blood loss anemia 03/23/2013  . S/P right THA, AA 03/20/2013  . Hyperlipidemia   . Erectile dysfunction   . History of asbestos exposure   . Nocturia   . History of shingles   . Dermatitis, atopic   . OA (osteoarthritis) of hip   . Arthritis   . Frequency of urination   . Lumbar scoliosis    . Malignant neoplasm of prostate ([place]) 06/07/2011  . 83 year old gentleman with stage T3 adenocarcinoma prostate with Gleason score 4+4 and PSA of 10.3 05/11/2011    Alanson Puls, PT DPT 10/26/2016, 3:17 PM  [place] MAIN [facility] SERVICES [address] [place], [place], [address] Phone: [phone]   Fax:  [phone]  Name: Derek Carr MRN: [id number] Date of Birth: 14-Sep-1931

## 2016-10-28 ENCOUNTER — Ambulatory Visit: Payer: Medicare HMO | Admitting: Physical Therapy

## 2016-10-28 ENCOUNTER — Encounter: Payer: Self-pay | Admitting: Physical Therapy

## 2016-10-28 DIAGNOSIS — M79605 Pain in left leg: Secondary | ICD-10-CM

## 2016-10-28 DIAGNOSIS — R262 Difficulty in walking, not elsewhere classified: Secondary | ICD-10-CM

## 2016-10-28 DIAGNOSIS — M6281 Muscle weakness (generalized): Secondary | ICD-10-CM

## 2016-10-28 NOTE — Therapy (Signed)
[place] MAIN [facility] SERVICES [address] [place], [place], [address] Phone: [phone]   Fax:  [phone]  Physical Therapy Treatment  Patient Details  Name: Derek Carr MRN: [id number] Date of Birth: [birth year] Referring Provider: Vladimir Crofts   Encounter Date: 10/28/2016      PT End of Session - 10/28/16 1155    Visit Number 12   Number of Visits 17   Date for PT Re-Evaluation 11/15/16   Authorization Type 2/10 g codes   PT Start Time 1150   PT Stop Time 1230   PT Time Calculation (min) 40 min   Equipment Utilized During Treatment Gait belt   Activity Tolerance Patient tolerated treatment well   Behavior During Therapy [facility] for tasks assessed/performed      Past Medical History:  Diagnosis Date  . Anemia   . Atherosclerosis   . Complication of anesthesia    CONFUSION - Pt's family very concerned about this  . DDD (degenerative disc disease)   . Degenerative arthritis   . Dermatitis, atopic ARMS AND LEGS  . Diverticulosis   . Erectile dysfunction   . Frequency of urination   . Glaucoma   . History of asbestos exposure   . History of radiation therapy 8/19-13-10/18/11   prostate, 7 GY  . History of shingles 2012-- BILATERAL EYES--  NO RESIDUAL  . Hyperlipidemia   . Hypertension   . Inguinal hernia   . Lumbar scoliosis   . Nocturia   . OA (osteoarthritis) of hip RIGHT  . Prostate cancer ([place]) 05/11/2011   bx=Adenocarcinoma,gleason3+4=7, 4+4=8,PSA=10.30volume=45.7cc  . Renal cyst    bilateral  . Smokers' cough ([place])   . Thyroid cyst     Past Surgical History:  Procedure Laterality Date  . ATTEMPTED LEFT VATS/ LEFT THORACOTOMY/ RESECTION OF THE ENORMOUS, PROBABLE BRONCHOGENIC CYST  01-04-2005  DR Arlyce Dice  . CARDIAC CATHETERIZATION  02-24-2009  DR NASHER   MINOR CORONARY ARTERY IRREGULARITIES/ NORMAL LVSF/ EF 65-70%  . COLONOSCOPY W/ POLYPECTOMY    . CYSTOSCOPY  11/15/2011   Procedure: CYSTOSCOPY;  Surgeon:  Dutch Gray, MD;  Location: [facility];  Service: Urology;  Laterality: N/A;  no seeds seen in bladder  . esophageus cyst removal  YRS AGO  . [place]  . PROSTATE BIOPSY  05/11/11   Adenocarcinoma (MD OFFICE)  . RADIOACTIVE SEED IMPLANT  11/15/2011   Procedure: RADIOACTIVE SEED IMPLANT;  Surgeon: Dutch Gray, MD;  Location: [facility];  Service: Urology;  Laterality: N/A;  Total number of seeds - 52  . REPAIR RIGHT INGUINAL HERNIA W/ MESH  09-10-1999  . TOTAL HIP ARTHROPLASTY Right 03/20/2013   Procedure: RIGHT TOTAL HIP ARTHROPLASTY ANTERIOR APPROACH;  Surgeon: [name], MD;  Location: [facility];  Service: Orthopedics;  Laterality: Right;  . TOTAL HIP REVISION Right 05/14/2013   Procedure: open reduction internal fixation REVISION RIGHT HIP ;  Surgeon: [name], MD;  Location: [facility];  Service: Orthopedics;  Laterality: Right;  . UMBILICAL HERNIA REPAIR  1998   epigastric    There were no vitals filed for this visit.      Subjective Assessment - 10/28/16 1154    Subjective Pt reports that overall he is feeling much better.  States that he feels he is getting around much easier.  Reports 5/10 pain/soreness in L LE.   Patient is accompained by: Family member   Pertinent History Patient an having dizziness  and gait disturbance in 2015 and it has not gotten better. He had the right hip replaced in 2015 . He is having LLE leg giving away and he started falling. Now he can not always  bear weight on his leg.     Limitations Standing;Walking   How long can you stand comfortably? unknown , vairable   How long can you walk comfortably? unknown , variable   Currently in Pain? Yes   Pain Score 5    Pain Location Leg   Pain Orientation Left   Pain Descriptors / Indicators Sore   Pain Type Chronic pain   Pain Onset More than a month ago     There Ex:  [employer] x 6 min warmup   Stair navigation practice x 4 ascent and descent:  verbal cues for proper pattern x 1   Standing heel raises x 30 reps   Standing hip ext x 20 B LE   Standing HS curls x 20 B LE - cues for form     Lateral walking in // bars x 8 laps    Toe taps to 6 in step x 20 Reps B LE's - cues for improved standing posture during activity      Lateral toe taps to 6 in step x 20 B LE's     Walking x 500 ft x 2 with one seated rest break without use of AD - demonstrated improved gait speed and improved stride and step lengths   Stair navigation to assess carryover before end of session x 1  Pt states that his pain has decreased to 3/10 at end of today's session                       PT Education - 10/28/16 1155    Education provided Yes   Education Details Cotinuation of HEP   Person(s) Educated Patient   Methods Explanation   Comprehension Verbalized understanding;Returned demonstration          PT Short Term Goals - 10/19/16 1715      PT SHORT TERM GOAL #1   Title Patient will complete a sit to stand transfer with proper placement of hands and with out loss of balance to be safer and reduce his falls risk   Time 4   Period Weeks   Status Achieved     PT SHORT TERM GOAL #2   Title Patient will increase BLE gross strength to 3/5 as to improve functional strength for independent gait, increased standing tolerance and increased ADL ability.   Time 4   Period Weeks   Status Achieved           PT Long Term Goals - 10/21/16 1505      PT LONG TERM GOAL #1   Title Patient will be independent in home exercise program to improve strength/mobility for better functional independence with ADLs.   Time 8   Period Weeks   Status On-going     PT LONG TERM GOAL #2   Title Patient (< 65 years old) will complete five times sit to stand test in < 10 seconds indicating an increased LE strength and improved balance.   Time 8   Period Weeks   Status On-going     PT LONG TERM GOAL #3   Title Patient will increase 10  meter walk test to >1.41m/s as to improve gait speed for better community ambulation and to reduce fall risk.   Time  8   Period Weeks   Status On-going               Plan - 10/28/16 1210    Clinical Impression Statement Pt continues to demonstrate improvements with LE strength and ambulation.  Pt continues to have mild to moderate pain/soreness of L LE at times, especially after weightbearing through L LE with activities. Pt demonstrated carry over with stair training activity.  Pt would continue to benefit from skilled PT services in order to continue facilitating improvements in gait and LE strength in order to improve functional mobility.   Rehab Potential Good   PT Frequency 2x / week   PT Duration 8 weeks   PT Treatment/Interventions Gait training;Functional mobility training;Neuromuscular re-education;Balance training;Therapeutic exercise;Therapeutic activities;Patient/family education   PT Next Visit Plan Look at goals and outcome measures next session   PT Home Exercise Plan continue HEP as prescribed    Consulted and Agree with Plan of Care Patient      Patient will benefit from skilled therapeutic intervention in order to improve the following deficits and impairments:  Abnormal gait, Decreased balance, Decreased endurance, Decreased mobility, Difficulty walking, Decreased activity tolerance, Decreased strength, Impaired flexibility, Decreased safety awareness, Decreased knowledge of use of DME  Visit Diagnosis: Difficulty in walking, not elsewhere classified  Muscle weakness (generalized)  Pain in left leg     Problem List Patient Active Problem List   Diagnosis Date Noted  . Cigarette smoker 06/16/2016  . Dyspnea on exertion 06/15/2016  . Dementia arising in the senium and presenium 01/07/2014  . CSA (central sleep apnea) 10/05/2013  . Syncope 06/26/2013  . COPD GOLD II 06/25/2013  . BPH (benign prostatic hyperplasia) 05/18/2013  . Glaucoma 05/18/2013  .  S/P right TH revision 05/14/2013  . Constipation 03/26/2013  . Osteoporosis 03/26/2013  . Expected blood loss anemia 03/23/2013  . S/P right THA, AA 03/20/2013  . Hyperlipidemia   . Erectile dysfunction   . History of asbestos exposure   . Nocturia   . History of shingles   . Dermatitis, atopic   . OA (osteoarthritis) of hip   . Arthritis   . Frequency of urination   . Lumbar scoliosis   . Malignant neoplasm of prostate ([place]) 06/07/2011  . 83 year old gentleman with stage T3 adenocarcinoma prostate with Gleason score 4+4 and PSA of 10.3 05/11/2011   This entire session was performed under direct supervision and direction of a licensed therapist/therapist assistant . I have personally read, edited and approve of the note as written. Netta Corrigan, SPT Alanson Puls, PT, DPT  10/28/2016, 5:32 PM  [place] MAIN [facility] SERVICES [address] [place], [place], [address] Phone: [phone]   Fax:  [phone]  Name: Derek Carr MRN: [id number] Date of Birth: 08/01/1931

## 2016-10-28 NOTE — Therapy (Signed)
[place] MAIN [facility] SERVICES [address] [place], [place], [address] Phone: [phone]   Fax:  [phone]  Physical Therapy Treatment  Patient Details  Name: Derek Carr MRN: [id number] Date of Birth: July 31, 1931 Referring Provider: Vladimir Crofts   Encounter Date: 10/28/2016      PT End of Session - 10/28/16 1155    Visit Number 12   Number of Visits 17   Date for PT Re-Evaluation 11/15/16   Authorization Type 2/10 g codes   PT Start Time 1150   PT Stop Time 1230   PT Time Calculation (min) 40 min   Equipment Utilized During Treatment Gait belt   Activity Tolerance Patient tolerated treatment well   Behavior During Therapy [facility] for tasks assessed/performed      Past Medical History:  Diagnosis Date  . Anemia   . Atherosclerosis   . Complication of anesthesia    CONFUSION - Pt's family very concerned about this  . DDD (degenerative disc disease)   . Degenerative arthritis   . Dermatitis, atopic ARMS AND LEGS  . Diverticulosis   . Erectile dysfunction   . Frequency of urination   . Glaucoma   . History of asbestos exposure   . History of radiation therapy 8/19-13-10/18/11   prostate, 17 GY  . History of shingles 2012-- BILATERAL EYES--  NO RESIDUAL  . Hyperlipidemia   . Hypertension   . Inguinal hernia   . Lumbar scoliosis   . Nocturia   . OA (osteoarthritis) of hip RIGHT  . Prostate cancer ([place]) 05/11/2011   bx=Adenocarcinoma,gleason3+4=7, 4+4=8,PSA=10.30volume=45.7cc  . Renal cyst    bilateral  . Smokers' cough ([place])   . Thyroid cyst     Past Surgical History:  Procedure Laterality Date  . ATTEMPTED LEFT VATS/ LEFT THORACOTOMY/ RESECTION OF THE ENORMOUS, PROBABLE BRONCHOGENIC CYST  01-04-2005  DR Arlyce Dice  . CARDIAC CATHETERIZATION  02-24-2009  DR NASHER   MINOR CORONARY ARTERY IRREGULARITIES/ NORMAL LVSF/ EF 65-70%  . COLONOSCOPY W/ POLYPECTOMY    . CYSTOSCOPY  11/15/2011   Procedure: CYSTOSCOPY;  Surgeon:  Dutch Gray, MD;  Location: [facility];  Service: Urology;  Laterality: N/A;  no seeds seen in bladder  . esophageus cyst removal  YRS AGO  . [place]  . PROSTATE BIOPSY  05/11/11   Adenocarcinoma (MD OFFICE)  . RADIOACTIVE SEED IMPLANT  11/15/2011   Procedure: RADIOACTIVE SEED IMPLANT;  Surgeon: Dutch Gray, MD;  Location: [facility];  Service: Urology;  Laterality: N/A;  Total number of seeds - 52  . REPAIR RIGHT INGUINAL HERNIA W/ MESH  09-10-1999  . TOTAL HIP ARTHROPLASTY Right 03/20/2013   Procedure: RIGHT TOTAL HIP ARTHROPLASTY ANTERIOR APPROACH;  Surgeon: [name], MD;  Location: [facility];  Service: Orthopedics;  Laterality: Right;  . TOTAL HIP REVISION Right 05/14/2013   Procedure: open reduction internal fixation REVISION RIGHT HIP ;  Surgeon: [name], MD;  Location: [facility];  Service: Orthopedics;  Laterality: Right;  . UMBILICAL HERNIA REPAIR  1998   epigastric    There were no vitals filed for this visit.      Subjective Assessment - 10/28/16 1154    Subjective Pt reports that overall he is feeling much better.  States that he feels he is getting around much easier.  Reports 5/10 pain in L LE.   Patient is accompained by: Family member   Pertinent History Patient an having dizziness  and gait disturbance in 2015 and it has not gotten better. He had the right hip replaced in 2015 . He is having LLE leg giving away and he started falling. Now he can not always  bear weight on his leg.     Limitations Standing;Walking   How long can you stand comfortably? unknown , vairable   How long can you walk comfortably? unknown , variable   Currently in Pain? Yes   Pain Score 5    Pain Location Leg   Pain Orientation Left   Pain Descriptors / Indicators Sore   Pain Type Chronic pain   Pain Onset More than a month ago                                 PT Education - 10/28/16 1155    Education provided Yes    Education Details Cotinuation of HEP   Person(s) Educated Patient   Methods Explanation   Comprehension Verbalized understanding;Returned demonstration          PT Short Term Goals - 10/19/16 1715      PT SHORT TERM GOAL #1   Title Patient will complete a sit to stand transfer with proper placement of hands and with out loss of balance to be safer and reduce his falls risk   Time 4   Period Weeks   Status Achieved     PT SHORT TERM GOAL #2   Title Patient will increase BLE gross strength to 3/5 as to improve functional strength for independent gait, increased standing tolerance and increased ADL ability.   Time 4   Period Weeks   Status Achieved           PT Long Term Goals - 10/21/16 1505      PT LONG TERM GOAL #1   Title Patient will be independent in home exercise program to improve strength/mobility for better functional independence with ADLs.   Time 8   Period Weeks   Status On-going     PT LONG TERM GOAL #2   Title Patient (< 87 years old) will complete five times sit to stand test in < 10 seconds indicating an increased LE strength and improved balance.   Time 8   Period Weeks   Status On-going     PT LONG TERM GOAL #3   Title Patient will increase 10 meter walk test to >1.29m/s as to improve gait speed for better community ambulation and to reduce fall risk.   Time 8   Period Weeks   Status On-going               Plan - 10/28/16 1210    Clinical Impression Statement Pt continues to demonstrate improvements with LE strength and ambulation.  Pt continues to have mild to moderate pain/soreness of L LE at times, especially after weightbearing through L LE with activities. Pt demonstrated carry over with stair training activity.  Pt would continue to benefit from skilled PT services in order to continue facilitating improvements in gait and LE strength in order to improve functional mobility.   Rehab Potential Good   PT Frequency 2x / week   PT  Duration 8 weeks   PT Treatment/Interventions Gait training;Functional mobility training;Neuromuscular re-education;Balance training;Therapeutic exercise;Therapeutic activities;Patient/family education   PT Next Visit Plan Look at goals and outcome measures next session   PT Home Exercise Plan continue HEP as prescribed  Consulted and Agree with Plan of Care Patient      Patient will benefit from skilled therapeutic intervention in order to improve the following deficits and impairments:  Abnormal gait, Decreased balance, Decreased endurance, Decreased mobility, Difficulty walking, Decreased activity tolerance, Decreased strength, Impaired flexibility, Decreased safety awareness, Decreased knowledge of use of DME  Visit Diagnosis: Difficulty in walking, not elsewhere classified  Muscle weakness (generalized)  Pain in left leg     Problem List Patient Active Problem List   Diagnosis Date Noted  . Cigarette smoker 06/16/2016  . Dyspnea on exertion 06/15/2016  . Dementia arising in the senium and presenium 01/07/2014  . CSA (central sleep apnea) 10/05/2013  . Syncope 06/26/2013  . COPD GOLD II 06/25/2013  . BPH (benign prostatic hyperplasia) 05/18/2013  . Glaucoma 05/18/2013  . S/P right TH revision 05/14/2013  . Constipation 03/26/2013  . Osteoporosis 03/26/2013  . Expected blood loss anemia 03/23/2013  . S/P right THA, AA 03/20/2013  . Hyperlipidemia   . Erectile dysfunction   . History of asbestos exposure   . Nocturia   . History of shingles   . Dermatitis, atopic   . OA (osteoarthritis) of hip   . Arthritis   . Frequency of urination   . Lumbar scoliosis   . Malignant neoplasm of prostate ([place]) 06/07/2011  . 83 year old gentleman with stage T3 adenocarcinoma prostate with Gleason score 4+4 and PSA of 10.3 05/11/2011    Alanson Puls 10/28/2016, 5:32 PM  [place] MAIN [facility] SERVICES [address] [place],  [place], [address] Phone: [phone]   Fax:  [phone]  Name: DARRAGH NAY MRN: [id number] Date of Birth: 1931-07-27

## 2016-11-01 DIAGNOSIS — H401132 Primary open-angle glaucoma, bilateral, moderate stage: Secondary | ICD-10-CM | POA: Diagnosis not present

## 2016-11-01 DIAGNOSIS — H348322 Tributary (branch) retinal vein occlusion, left eye, stable: Secondary | ICD-10-CM | POA: Diagnosis not present

## 2016-11-01 DIAGNOSIS — H2513 Age-related nuclear cataract, bilateral: Secondary | ICD-10-CM | POA: Diagnosis not present

## 2016-11-01 DIAGNOSIS — H25013 Cortical age-related cataract, bilateral: Secondary | ICD-10-CM | POA: Diagnosis not present

## 2016-11-02 ENCOUNTER — Encounter: Payer: Self-pay | Admitting: Physical Therapy

## 2016-11-02 ENCOUNTER — Ambulatory Visit: Payer: Medicare HMO | Admitting: Physical Therapy

## 2016-11-02 DIAGNOSIS — M79605 Pain in left leg: Secondary | ICD-10-CM

## 2016-11-02 DIAGNOSIS — R262 Difficulty in walking, not elsewhere classified: Secondary | ICD-10-CM | POA: Diagnosis not present

## 2016-11-02 DIAGNOSIS — M6281 Muscle weakness (generalized): Secondary | ICD-10-CM

## 2016-11-02 NOTE — Therapy (Addendum)
[place] MAIN [facility] SERVICES [address] [place], [place], [address] Phone: [phone]   Fax:  [phone]  Physical Therapy Treatment  Patient Details  Name: Derek Carr MRN: [id number] Date of Birth: 09/29/1931 Referring Provider: Vladimir Crofts   Encounter Date: 11/02/2016      PT End of Session - 11/02/16 0937    Visit Number 13   Number of Visits 17   Date for PT Re-Evaluation 11/15/16   Authorization Type 3/10 g codes   PT Start Time 0930   PT Stop Time 1012   PT Time Calculation (min) 42 min   Equipment Utilized During Treatment Gait belt   Activity Tolerance Patient tolerated treatment well   Behavior During Therapy [facility] for tasks assessed/performed      Past Medical History:  Diagnosis Date  . Anemia   . Atherosclerosis   . Complication of anesthesia    CONFUSION - Pt's family very concerned about this  . DDD (degenerative disc disease)   . Degenerative arthritis   . Dermatitis, atopic ARMS AND LEGS  . Diverticulosis   . Erectile dysfunction   . Frequency of urination   . Glaucoma   . History of asbestos exposure   . History of radiation therapy 8/19-13-10/18/11   prostate, 40 GY  . History of shingles 2012-- BILATERAL EYES--  NO RESIDUAL  . Hyperlipidemia   . Hypertension   . Inguinal hernia   . Lumbar scoliosis   . Nocturia   . OA (osteoarthritis) of hip RIGHT  . Prostate cancer ([place]) 05/11/2011   bx=Adenocarcinoma,gleason3+4=7, 4+4=8,PSA=10.30volume=45.7cc  . Renal cyst    bilateral  . Smokers' cough ([place])   . Thyroid cyst     Past Surgical History:  Procedure Laterality Date  . ATTEMPTED LEFT VATS/ LEFT THORACOTOMY/ RESECTION OF THE ENORMOUS, PROBABLE BRONCHOGENIC CYST  01-04-2005  DR Arlyce Dice  . CARDIAC CATHETERIZATION  02-24-2009  DR NASHER   MINOR CORONARY ARTERY IRREGULARITIES/ NORMAL LVSF/ EF 65-70%  . COLONOSCOPY W/ POLYPECTOMY    . CYSTOSCOPY  11/15/2011   Procedure: CYSTOSCOPY;  Surgeon:  Dutch Gray, MD;  Location: [facility];  Service: Urology;  Laterality: N/A;  no seeds seen in bladder  . esophageus cyst removal  YRS AGO  . [place]  . PROSTATE BIOPSY  05/11/11   Adenocarcinoma (MD OFFICE)  . RADIOACTIVE SEED IMPLANT  11/15/2011   Procedure: RADIOACTIVE SEED IMPLANT;  Surgeon: Dutch Gray, MD;  Location: [facility];  Service: Urology;  Laterality: N/A;  Total number of seeds - 52  . REPAIR RIGHT INGUINAL HERNIA W/ MESH  09-10-1999  . TOTAL HIP ARTHROPLASTY Right 03/20/2013   Procedure: RIGHT TOTAL HIP ARTHROPLASTY ANTERIOR APPROACH;  Surgeon: [name], MD;  Location: [facility];  Service: Orthopedics;  Laterality: Right;  . TOTAL HIP REVISION Right 05/14/2013   Procedure: open reduction internal fixation REVISION RIGHT HIP ;  Surgeon: [name], MD;  Location: [facility];  Service: Orthopedics;  Laterality: Right;  . UMBILICAL HERNIA REPAIR  1998   epigastric    There were no vitals filed for this visit.      Subjective Assessment - 11/02/16 0934    Subjective Pt states that he feels good overall today, he states that he has no pain in his L LE.  States that he did more walking over the weekend, reports mild soreness afterwards.  Pt presented today using SPC for ambulation instead of  his usual RW.     Patient is accompained by: Family member   Pertinent History Patient an having dizziness and gait disturbance in 2015 and it has not gotten better. He had the right hip replaced in 2015 . He is having LLE leg giving away and he started falling. Now he can not always  bear weight on his leg.     Limitations Standing;Walking   How long can you stand comfortably? unknown , vairable   How long can you walk comfortably? unknown , variable   Currently in Pain? No/denies   Pain Score 0-No pain   Pain Onset More than a month ago   Multiple Pain Sites No      Treatment:  Octane fitness x 5 min warmup   Walking with SPC x  1000 ft with SBA   Stair ascent/descent x 5 reps with step to pattern - cues for proper navigation sequence   Standing heel raises x 30 reps   Lateral walking in // bars with yellow theraband x 5 laps   Monster walks x 5 laps in // bars with yellow theraband    Yellow theraband:   Hip ABD x 20 B LE   Hip ext x 20 B LE   Hip flex x 20 B LE    Toe taps to 6 in step x 20 B LE   Lateral toe taps to 6 in step x 20 B LE   Marching in // bars x 5 laps with UE support     Pt required cues for proper form and technique, reminders for stair navigation sequence and for proper use of AD during ambulation                           PT Education - 11/02/16 0936    Education provided Yes   Education Details HEP continuation; ambulation with SPC and proper AD height   Person(s) Educated Patient   Methods Explanation;Demonstration   Comprehension Verbalized understanding;Returned demonstration          PT Short Term Goals - 10/19/16 1715      PT SHORT TERM GOAL #1   Title Patient will complete a sit to stand transfer with proper placement of hands and with out loss of balance to be safer and reduce his falls risk   Time 4   Period Weeks   Status Achieved     PT SHORT TERM GOAL #2   Title Patient will increase BLE gross strength to 3/5 as to improve functional strength for independent gait, increased standing tolerance and increased ADL ability.   Time 4   Period Weeks   Status Achieved           PT Long Term Goals - 10/21/16 1505      PT LONG TERM GOAL #1   Title Patient will be independent in home exercise program to improve strength/mobility for better functional independence with ADLs.   Time 8   Period Weeks   Status On-going     PT LONG TERM GOAL #2   Title Patient (< 48 years old) will complete five times sit to stand test in < 10 seconds indicating an increased LE strength and improved balance.   Time 8   Period Weeks   Status On-going      PT LONG TERM GOAL #3   Title Patient will increase 10 meter walk test to >1.47m/s as to improve gait speed  for better community ambulation and to reduce fall risk.   Time 8   Period Weeks   Status On-going               Plan - 11/02/16 1000    Clinical Impression Statement Pt has shown significant improvement in reduction in pain levels and improvements in ambulation ability.  Pt presented to today's session using SPC, and stated that he had been using it instead of his RW over the weekend.  Pt was able to complete all exercises today without increase of L LE symptoms.  Pt would continue to benefit from skilled PT services in order to address LE weakness and ambulation deficits in order to decrease risk of falls and improve function mobility.   Rehab Potential Good   PT Frequency 2x / week   PT Duration 8 weeks   PT Treatment/Interventions Gait training;Functional mobility training;Neuromuscular re-education;Balance training;Therapeutic exercise;Therapeutic activities;Patient/family education   PT Next Visit Plan Look at goals and outcome measures next session   PT Home Exercise Plan continue HEP as prescribed    Consulted and Agree with Plan of Care Patient      Patient will benefit from skilled therapeutic intervention in order to improve the following deficits and impairments:  Abnormal gait, Decreased balance, Decreased endurance, Decreased mobility, Difficulty walking, Decreased activity tolerance, Decreased strength, Impaired flexibility, Decreased safety awareness, Decreased knowledge of use of DME  Visit Diagnosis: Difficulty in walking, not elsewhere classified  Muscle weakness (generalized)  Pain in left leg     Problem List Patient Active Problem List   Diagnosis Date Noted  . Cigarette smoker 06/16/2016  . Dyspnea on exertion 06/15/2016  . Dementia arising in the senium and presenium 01/07/2014  . CSA (central sleep apnea) 10/05/2013  . Syncope 06/26/2013   . COPD GOLD II 06/25/2013  . BPH (benign prostatic hyperplasia) 05/18/2013  . Glaucoma 05/18/2013  . S/P right TH revision 05/14/2013  . Constipation 03/26/2013  . Osteoporosis 03/26/2013  . Expected blood loss anemia 03/23/2013  . S/P right THA, AA 03/20/2013  . Hyperlipidemia   . Erectile dysfunction   . History of asbestos exposure   . Nocturia   . History of shingles   . Dermatitis, atopic   . OA (osteoarthritis) of hip   . Arthritis   . Frequency of urination   . Lumbar scoliosis   . Malignant neoplasm of prostate ([place]) 06/07/2011  . 83 year old gentleman with stage T3 adenocarcinoma prostate with Gleason score 4+4 and PSA of 10.3 05/11/2011   This entire session was performed under direct supervision and direction of a licensed therapist/therapist assistant . I have personally read, edited and approve of the note as written. Netta Corrigan, SPT Alanson Puls, PT, DPT  11/02/2016, 4:26 PM  [place] MAIN [facility] SERVICES [address] [place], [place], [address] Phone: [phone]   Fax:  [phone]  Name: Derek Carr MRN: [id number] Date of Birth: 16-Jun-1931

## 2016-11-04 ENCOUNTER — Ambulatory Visit: Payer: Medicare HMO | Admitting: Physical Therapy

## 2016-11-04 ENCOUNTER — Encounter: Payer: Self-pay | Admitting: Physical Therapy

## 2016-11-04 DIAGNOSIS — M6281 Muscle weakness (generalized): Secondary | ICD-10-CM

## 2016-11-04 DIAGNOSIS — R262 Difficulty in walking, not elsewhere classified: Secondary | ICD-10-CM

## 2016-11-04 DIAGNOSIS — M79605 Pain in left leg: Secondary | ICD-10-CM

## 2016-11-04 NOTE — Therapy (Signed)
[place] MAIN [facility] SERVICES [address] [place], [place], [address] Phone: [phone]   Fax:  [phone]  Physical Therapy Treatment  Patient Details  Name: Derek Carr MRN: [id number] Date of Birth: [birth year] Referring Provider: Vladimir Crofts   Encounter Date: 11/04/2016      PT End of Session - 11/04/16 1105    Visit Number 14   Number of Visits 17   Date for PT Re-Evaluation 11/15/16   Authorization Type 4/10 g codes   PT Start Time 05-01-1056   PT Stop Time 1140   PT Time Calculation (min) 42 min   Equipment Utilized During Treatment Gait belt   Activity Tolerance Patient tolerated treatment well   Behavior During Therapy [facility] for tasks assessed/performed      Past Medical History:  Diagnosis Date  . Anemia   . Atherosclerosis   . Complication of anesthesia    CONFUSION - Pt's family very concerned about this  . DDD (degenerative disc disease)   . Degenerative arthritis   . Dermatitis, atopic ARMS AND LEGS  . Diverticulosis   . Erectile dysfunction   . Frequency of urination   . Glaucoma   . History of asbestos exposure   . History of radiation therapy 8/19-13-10/18/11   prostate, 22 GY  . History of shingles May 02, 2010-- BILATERAL EYES--  NO RESIDUAL  . Hyperlipidemia   . Hypertension   . Inguinal hernia   . Lumbar scoliosis   . Nocturia   . OA (osteoarthritis) of hip RIGHT  . Prostate cancer ([place]) 05/11/2011   bx=Adenocarcinoma,gleason3+4=7, 4+4=8,PSA=10.30volume=45.7cc  . Renal cyst    bilateral  . Smokers' cough ([place])   . Thyroid cyst     Past Surgical History:  Procedure Laterality Date  . ATTEMPTED LEFT VATS/ LEFT THORACOTOMY/ RESECTION OF THE ENORMOUS, PROBABLE BRONCHOGENIC CYST  01-04-2005  DR Arlyce Dice  . CARDIAC CATHETERIZATION  02-24-2009  DR NASHER   MINOR CORONARY ARTERY IRREGULARITIES/ NORMAL LVSF/ EF 65-70%  . COLONOSCOPY W/ POLYPECTOMY    . CYSTOSCOPY  11/15/2011   Procedure: CYSTOSCOPY;  Surgeon:  Dutch Gray, MD;  Location: [facility];  Service: Urology;  Laterality: N/A;  no seeds seen in bladder  . esophageus cyst removal  YRS AGO  . [place]  . PROSTATE BIOPSY  05/11/11   Adenocarcinoma (MD OFFICE)  . RADIOACTIVE SEED IMPLANT  11/15/2011   Procedure: RADIOACTIVE SEED IMPLANT;  Surgeon: Dutch Gray, MD;  Location: [facility];  Service: Urology;  Laterality: N/A;  Total number of seeds - 52  . REPAIR RIGHT INGUINAL HERNIA W/ MESH  09-10-1999  . TOTAL HIP ARTHROPLASTY Right 03/20/2013   Procedure: RIGHT TOTAL HIP ARTHROPLASTY ANTERIOR APPROACH;  Surgeon: [name], MD;  Location: [facility];  Service: Orthopedics;  Laterality: Right;  . TOTAL HIP REVISION Right 05/14/2013   Procedure: open reduction internal fixation REVISION RIGHT HIP ;  Surgeon: [name], MD;  Location: [facility];  Service: Orthopedics;  Laterality: Right;  . UMBILICAL HERNIA REPAIR  1998   epigastric    There were no vitals filed for this visit.      Subjective Assessment - 11/04/16 1105    Subjective Pt states that he feels great today, denies any pain or soreness of the L LE.   Patient is accompained by: Family member   Pertinent History Patient an having dizziness and gait disturbance in 2013-05-01 and it has not gotten better.  He had the right hip replaced in 2015 . He is having LLE leg giving away and he started falling. Now he can not always  bear weight on his leg.     Limitations Standing;Walking   How long can you stand comfortably? unknown , vairable   How long can you walk comfortably? unknown , variable   Currently in Pain? No/denies   Pain Score 0-No pain   Pain Onset More than a month ago     Treatment:  Octane fitness x 5 min for warmup    Fwd lunges in to BOSU ball x 20 B LE   Toe taps to 6 in step x 30 B LE's - cues for exercise progression   Lateral toe taps to 6 in step x 30 B LE's - cues for form and exercise progression    Hip strength  exercises with Red therband:   Flexion x 30 B LE   Abd x 30 B LE   Ext x 30 B LE    - visual and verbal cues for exercise form   and technique     Resisted gait 7.5 lbs fwd and reverse x 5 each direction - cues proper exercise form    Gait practice with SPC for 500 ft x 2 - improved speed noted without increase of pain, pt required one seated rest break secondary to fatigue                                 PT Education - 11/04/16 1105    Education provided Yes   Education Details continuation of daily walking and HEP to continue strength training at home.    Person(s) Educated Patient   Methods Explanation;Demonstration   Comprehension Verbalized understanding;Returned demonstration          PT Short Term Goals - 10/19/16 1715      PT SHORT TERM GOAL #1   Title Patient will complete a sit to stand transfer with proper placement of hands and with out loss of balance to be safer and reduce his falls risk   Time 4   Period Weeks   Status Achieved     PT SHORT TERM GOAL #2   Title Patient will increase BLE gross strength to 3/5 as to improve functional strength for independent gait, increased standing tolerance and increased ADL ability.   Time 4   Period Weeks   Status Achieved           PT Long Term Goals - 10/21/16 1505      PT LONG TERM GOAL #1   Title Patient will be independent in home exercise program to improve strength/mobility for better functional independence with ADLs.   Time 8   Period Weeks   Status On-going     PT LONG TERM GOAL #2   Title Patient (< 63 years old) will complete five times sit to stand test in < 10 seconds indicating an increased LE strength and improved balance.   Time 8   Period Weeks   Status On-going     PT LONG TERM GOAL #3   Title Patient will increase 10 meter walk test to >1.28m/s as to improve gait speed for better community ambulation and to reduce fall risk.   Time 8   Period Weeks   Status  On-going               Plan -  11/04/16 1144    Clinical Impression Statement Pt presented today with no complaints of pain in L LE and was able to perform all exerciess without increase of symptoms.  Pt continues to show improvementin LE strength and stability, as well as improvements with activity tolerance.  Increased reps and resistance was implemented with exercises today in order to further progress pt.  Resisted gait activities were also implemented today to target both core and LE musculature as well as dynamic balance.  Pt would continue to benefit from skilled therapy services in order to continue progressing LE strength and improving static and dynamic balance in order to decrease risk of falls and improve functional mobility and funcitonal activity tolerance.   Rehab Potential Good   PT Frequency 2x / week   PT Duration 8 weeks   PT Treatment/Interventions Gait training;Functional mobility training;Neuromuscular re-education;Balance training;Therapeutic exercise;Therapeutic activities;Patient/family education   PT Next Visit Plan dynamic balance, LE and core strength, goals   PT Home Exercise Plan continue HEP as prescribed    Consulted and Agree with Plan of Care Patient      Patient will benefit from skilled therapeutic intervention in order to improve the following deficits and impairments:  Abnormal gait, Decreased balance, Decreased endurance, Decreased mobility, Difficulty walking, Decreased activity tolerance, Decreased strength, Impaired flexibility, Decreased safety awareness, Decreased knowledge of use of DME  Visit Diagnosis: Difficulty in walking, not elsewhere classified  Muscle weakness (generalized)  Pain in left leg     Problem List Patient Active Problem List   Diagnosis Date Noted  . Cigarette smoker 06/16/2016  . Dyspnea on exertion 06/15/2016  . Dementia arising in the senium and presenium 01/07/2014  . CSA (central sleep apnea) 10/05/2013  .  Syncope 06/26/2013  . COPD GOLD II 06/25/2013  . BPH (benign prostatic hyperplasia) 05/18/2013  . Glaucoma 05/18/2013  . S/P right TH revision 05/14/2013  . Constipation 03/26/2013  . Osteoporosis 03/26/2013  . Expected blood loss anemia 03/23/2013  . S/P right THA, AA 03/20/2013  . Hyperlipidemia   . Erectile dysfunction   . History of asbestos exposure   . Nocturia   . History of shingles   . Dermatitis, atopic   . OA (osteoarthritis) of hip   . Arthritis   . Frequency of urination   . Lumbar scoliosis   . Malignant neoplasm of prostate ([place]) 06/07/2011  . 83 year old gentleman with stage T3 adenocarcinoma prostate with Gleason score 4+4 and PSA of 10.3 05/11/2011   This entire session was performed under direct supervision and direction of a licensed therapist/therapist assistant . I have personally read, edited and approve of the note as written. Netta Corrigan, SPT Alanson Puls, PT, DPT  11/04/2016, 11:47 AM  [place] MAIN [facility] SERVICES [address] [place], [place], [address] Phone: [phone]   Fax:  [phone]  Name: Derek Carr MRN: [id number] Date of Birth: 05/30/1931

## 2016-11-08 NOTE — Telephone Encounter (Signed)
Close Encounter 

## 2016-11-09 ENCOUNTER — Ambulatory Visit: Payer: Medicare HMO | Admitting: Physical Therapy

## 2016-11-09 ENCOUNTER — Encounter: Payer: Self-pay | Admitting: Physical Therapy

## 2016-11-09 DIAGNOSIS — R262 Difficulty in walking, not elsewhere classified: Secondary | ICD-10-CM | POA: Diagnosis not present

## 2016-11-09 DIAGNOSIS — M6281 Muscle weakness (generalized): Secondary | ICD-10-CM

## 2016-11-09 DIAGNOSIS — M79605 Pain in left leg: Secondary | ICD-10-CM

## 2016-11-09 NOTE — Therapy (Signed)
[place] MAIN [facility] SERVICES [address] [place], [place], [address] Phone: [phone]   Fax:  [phone]  Physical Therapy Treatment  Patient Details  Name: Derek Carr MRN: [id number] Date of Birth: [birth year] Referring Provider: Vladimir Crofts   Encounter Date: 11/09/2016      PT End of Session - 11/09/16 1023    Visit Number 15   Number of Visits 17   Date for PT Re-Evaluation 11/15/16   Authorization Type 5/10 g codes   PT Start Time 1016   PT Stop Time 1056   PT Time Calculation (min) 40 min   Equipment Utilized During Treatment Gait belt   Activity Tolerance Patient tolerated treatment well   Behavior During Therapy [facility] for tasks assessed/performed      Past Medical History:  Diagnosis Date  . Anemia   . Atherosclerosis   . Complication of anesthesia    CONFUSION - Pt's family very concerned about this  . DDD (degenerative disc disease)   . Degenerative arthritis   . Dermatitis, atopic ARMS AND LEGS  . Diverticulosis   . Erectile dysfunction   . Frequency of urination   . Glaucoma   . History of asbestos exposure   . History of radiation therapy 8/19-13-10/18/11   prostate, 44 GY  . History of shingles 2012-- BILATERAL EYES--  NO RESIDUAL  . Hyperlipidemia   . Hypertension   . Inguinal hernia   . Lumbar scoliosis   . Nocturia   . OA (osteoarthritis) of hip RIGHT  . Prostate cancer ([place]) 05/11/2011   bx=Adenocarcinoma,gleason3+4=7, 4+4=8,PSA=10.30volume=45.7cc  . Renal cyst    bilateral  . Smokers' cough ([place])   . Thyroid cyst     Past Surgical History:  Procedure Laterality Date  . ATTEMPTED LEFT VATS/ LEFT THORACOTOMY/ RESECTION OF THE ENORMOUS, PROBABLE BRONCHOGENIC CYST  01-04-2005  DR Arlyce Dice  . CARDIAC CATHETERIZATION  02-24-2009  DR NASHER   MINOR CORONARY ARTERY IRREGULARITIES/ NORMAL LVSF/ EF 65-70%  . COLONOSCOPY W/ POLYPECTOMY    . CYSTOSCOPY  11/15/2011   Procedure: CYSTOSCOPY;  Surgeon:  Dutch Gray, MD;  Location: [facility];  Service: Urology;  Laterality: N/A;  no seeds seen in bladder  . esophageus cyst removal  YRS AGO  . [place]  . PROSTATE BIOPSY  05/11/11   Adenocarcinoma (MD OFFICE)  . RADIOACTIVE SEED IMPLANT  11/15/2011   Procedure: RADIOACTIVE SEED IMPLANT;  Surgeon: Dutch Gray, MD;  Location: [facility];  Service: Urology;  Laterality: N/A;  Total number of seeds - 52  . REPAIR RIGHT INGUINAL HERNIA W/ MESH  09-10-1999  . TOTAL HIP ARTHROPLASTY Right 03/20/2013   Procedure: RIGHT TOTAL HIP ARTHROPLASTY ANTERIOR APPROACH;  Surgeon: [name], MD;  Location: [facility];  Service: Orthopedics;  Laterality: Right;  . TOTAL HIP REVISION Right 05/14/2013   Procedure: open reduction internal fixation REVISION RIGHT HIP ;  Surgeon: [name], MD;  Location: [facility];  Service: Orthopedics;  Laterality: Right;  . UMBILICAL HERNIA REPAIR  1998   epigastric    There were no vitals filed for this visit.      Subjective Assessment - 11/09/16 1022    Subjective Pt reports no pain today.  States "lets go jogging."   Patient is accompained by: Family member   Pertinent History Patient an having dizziness and gait disturbance in 2015 and it has not gotten better. He had the right hip replaced  in 2015 . He is having LLE leg giving away and he started falling. Now he can not always  bear weight on his leg.     Limitations Standing;Walking   How long can you stand comfortably? unknown , vairable   How long can you walk comfortably? unknown , variable   Currently in Pain? No/denies   Pain Score 0-No pain   Pain Onset More than a month ago     Treatment:  Octane fitness x 5 min warmup   Leg press at 75lbs with slow eccentric contraction x 20 reps B LE's     Heel raises at leg press with 75lbs x 20 reps - cues for technique and form   Toe taps to bottom step with one UE support x 30 B LE's   Lateral toe taps to bottom  step with one UE support x 30 B LE's   Standing hip strengthening with red theraband:   ABD 2 x 15 B LE   Ext  2 x 15 B LE   Flex 2 x 15 B LE   Resisted gait with 7.5lbs fwd and reverse walking x 5 reps each  Increased cueing required today in order to perform activities with correct form and technique                        PT Education - 11/09/16 1022    Education provided Yes   Education Details Continuing to perform HEP regularly   Person(s) Educated Patient   Methods Explanation;Demonstration   Comprehension Verbalized understanding;Returned demonstration          PT Short Term Goals - 10/19/16 1715      PT SHORT TERM GOAL #1   Title Patient will complete a sit to stand transfer with proper placement of hands and with out loss of balance to be safer and reduce his falls risk   Time 4   Period Weeks   Status Achieved     PT SHORT TERM GOAL #2   Title Patient will increase BLE gross strength to 3/5 as to improve functional strength for independent gait, increased standing tolerance and increased ADL ability.   Time 4   Period Weeks   Status Achieved           PT Long Term Goals - 10/21/16 1505      PT LONG TERM GOAL #1   Title Patient will be independent in home exercise program to improve strength/mobility for better functional independence with ADLs.   Time 8   Period Weeks   Status On-going     PT LONG TERM GOAL #2   Title Patient (< 55 years old) will complete five times sit to stand test in < 10 seconds indicating an increased LE strength and improved balance.   Time 8   Period Weeks   Status On-going     PT LONG TERM GOAL #3   Title Patient will increase 10 meter walk test to >1.33m/s as to improve gait speed for better community ambulation and to reduce fall risk.   Time 8   Period Weeks   Status On-going               Plan - 11/09/16 1040    Clinical Impression Statement Pt continues to present to PT sessions with no  complaints of L LE pain or soreness.  Pt was able to perform all standing exercises in open and closed kinetic chain without  increase of symptoms and with decreased need for rest breaks.  Pt show improvement with ambulation and continues to use SPC.  Pt was able to progress reps and increase resistance of activities today.  Pt will continue to benefit from skilled PT services to address LE weakness and balance deficits in order to decrease falls risk and improve functional mobility and activity tolerance.   Rehab Potential Good   PT Frequency 2x / week   PT Duration 8 weeks   PT Treatment/Interventions Gait training;Functional mobility training;Neuromuscular re-education;Balance training;Therapeutic exercise;Therapeutic activities;Patient/family education   PT Next Visit Plan Recheck goals next session    PT Home Exercise Plan continue HEP as prescribed    Consulted and Agree with Plan of Care Patient      Patient will benefit from skilled therapeutic intervention in order to improve the following deficits and impairments:  Abnormal gait, Decreased balance, Decreased endurance, Decreased mobility, Difficulty walking, Decreased activity tolerance, Decreased strength, Impaired flexibility, Decreased safety awareness, Decreased knowledge of use of DME  Visit Diagnosis: Difficulty in walking, not elsewhere classified  Muscle weakness (generalized)  Pain in left leg     Problem List Patient Active Problem List   Diagnosis Date Noted  . Cigarette smoker 06/16/2016  . Dyspnea on exertion 06/15/2016  . Dementia arising in the senium and presenium 01/07/2014  . CSA (central sleep apnea) 10/05/2013  . Syncope 06/26/2013  . COPD GOLD II 06/25/2013  . BPH (benign prostatic hyperplasia) 05/18/2013  . Glaucoma 05/18/2013  . S/P right TH revision 05/14/2013  . Constipation 03/26/2013  . Osteoporosis 03/26/2013  . Expected blood loss anemia 03/23/2013  . S/P right THA, AA 03/20/2013  .  Hyperlipidemia   . Erectile dysfunction   . History of asbestos exposure   . Nocturia   . History of shingles   . Dermatitis, atopic   . OA (osteoarthritis) of hip   . Arthritis   . Frequency of urination   . Lumbar scoliosis   . Malignant neoplasm of prostate ([place]) 06/07/2011  . 83 year old gentleman with stage T3 adenocarcinoma prostate with Gleason score 4+4 and PSA of 10.3 05/11/2011  This entire session was performed under direct supervision and direction of a licensed therapist/therapist assistant . I have personally read, edited and approve of the note as written.  Netta Corrigan, SPT Alanson Puls, PT, DPT  11/09/2016, 4:15 PM  [place] MAIN [facility] SERVICES [address] [place], [place], [address] Phone: [phone]   Fax:  [phone]  Name: Derek Carr MRN: [id number] Date of Birth: 01-21-1932

## 2016-11-11 ENCOUNTER — Encounter: Payer: Self-pay | Admitting: Physical Therapy

## 2016-11-11 ENCOUNTER — Ambulatory Visit: Payer: Medicare HMO | Admitting: Physical Therapy

## 2016-11-11 DIAGNOSIS — R262 Difficulty in walking, not elsewhere classified: Secondary | ICD-10-CM | POA: Diagnosis not present

## 2016-11-11 DIAGNOSIS — M79605 Pain in left leg: Secondary | ICD-10-CM

## 2016-11-11 DIAGNOSIS — M6281 Muscle weakness (generalized): Secondary | ICD-10-CM

## 2016-11-11 NOTE — Therapy (Signed)
[place] MAIN [facility] SERVICES [address] [place], [place], [address] Phone: [phone]   Fax:  [phone]  Physical Therapy Treatment  Patient Details  Name: Derek Carr MRN: [id number] Date of Birth: [birth year] Referring Provider: Vladimir Crofts   Encounter Date: 11/11/2016      PT End of Session - 11/11/16 0937    Visit Number 16   Number of Visits 17   Date for PT Re-Evaluation 11/15/16   Authorization Type 6/10 g codes   PT Start Time 0931   PT Stop Time 1015   PT Time Calculation (min) 44 min   Equipment Utilized During Treatment Gait belt   Activity Tolerance Patient tolerated treatment well   Behavior During Therapy [facility] for tasks assessed/performed      Past Medical History:  Diagnosis Date  . Anemia   . Atherosclerosis   . Complication of anesthesia    CONFUSION - Pt's family very concerned about this  . DDD (degenerative disc disease)   . Degenerative arthritis   . Dermatitis, atopic ARMS AND LEGS  . Diverticulosis   . Erectile dysfunction   . Frequency of urination   . Glaucoma   . History of asbestos exposure   . History of radiation therapy 8/19-13-10/18/11   prostate, 77 GY  . History of shingles 2012-- BILATERAL EYES--  NO RESIDUAL  . Hyperlipidemia   . Hypertension   . Inguinal hernia   . Lumbar scoliosis   . Nocturia   . OA (osteoarthritis) of hip RIGHT  . Prostate cancer ([place]) 05/11/2011   bx=Adenocarcinoma,gleason3+4=7, 4+4=8,PSA=10.30volume=45.7cc  . Renal cyst    bilateral  . Smokers' cough ([place])   . Thyroid cyst     Past Surgical History:  Procedure Laterality Date  . ATTEMPTED LEFT VATS/ LEFT THORACOTOMY/ RESECTION OF THE ENORMOUS, PROBABLE BRONCHOGENIC CYST  01-04-2005  DR Arlyce Dice  . CARDIAC CATHETERIZATION  02-24-2009  DR NASHER   MINOR CORONARY ARTERY IRREGULARITIES/ NORMAL LVSF/ EF 65-70%  . COLONOSCOPY W/ POLYPECTOMY    . CYSTOSCOPY  11/15/2011   Procedure: CYSTOSCOPY;  Surgeon:  Dutch Gray, MD;  Location: [facility];  Service: Urology;  Laterality: N/A;  no seeds seen in bladder  . esophageus cyst removal  YRS AGO  . [place]  . PROSTATE BIOPSY  05/11/11   Adenocarcinoma (MD OFFICE)  . RADIOACTIVE SEED IMPLANT  11/15/2011   Procedure: RADIOACTIVE SEED IMPLANT;  Surgeon: Dutch Gray, MD;  Location: [facility];  Service: Urology;  Laterality: N/A;  Total number of seeds - 52  . REPAIR RIGHT INGUINAL HERNIA W/ MESH  09-10-1999  . TOTAL HIP ARTHROPLASTY Right 03/20/2013   Procedure: RIGHT TOTAL HIP ARTHROPLASTY ANTERIOR APPROACH;  Surgeon: [name], MD;  [facility];  Service: Orthopedics;  Laterality: Right;  . TOTAL HIP REVISION Right 05/14/2013   Procedure: open reduction internal fixation REVISION RIGHT HIP ;  Surgeon: [name], MD;  Location: [facility];  Service: Orthopedics;  Laterality: Right;  . UMBILICAL HERNIA REPAIR  1998   epigastric    There were no vitals filed for this visit.      Subjective Assessment - 11/11/16 0936    Subjective Pt reports no pain today - states that overall he feels good.   Patient is accompained by: Family member   Pertinent History Patient an having dizziness and gait disturbance in 2015 and it has not gotten better. He had the right  hip replaced in 2015 . He is having LLE leg giving away and he started falling. Now he can not always  bear weight on his leg.     Limitations Standing;Walking   How long can you stand comfortably? unknown , vairable   How long can you walk comfortably? unknown , variable   Currently in Pain? No/denies   Pain Score 0-No pain   Pain Onset More than a month ago     Treatment:  Octane fitness warmup x 5 mins   Leg press at 90lbs with cues for a slow eccentric contraction - 2 x 20    Single leg press at 45lbs with cues for a slow eccentric contraction - 2 x 20 each leg                          Toe taps to 6 in step x 30 B LE's                Lateral toe taps to 6 in step x 30 B LE's   Step ups to 6 in step x 30 reps with each leg   Lateral step ups to 6 in step x 30 reps each leg   Lateral stepping in // bars with 2 orange obstacle step overs x 5 laps                Weight distribution practice with foot performing fwd/reverse and side-side on soccer ball - x 30 reps each leg - cues for proper form and technique     CGA and Min verbal cues used throughout with increased in postural sway and LOB most seen with narrow base of support and while on uneven surfaces. Continues to have balance deficits typical with diagnosis. Patient performs intermediate level exercises without pain behaviors and needs verbal cuing for postural alignment                    PT Education - 11/11/16 0936    Education provided Yes   Education Details HEP progression   Person(s) Educated Patient   Methods Explanation;Demonstration   Comprehension Verbalized understanding;Returned demonstration          PT Short Term Goals - 10/19/16 1715      PT SHORT TERM GOAL #1   Title Patient will complete a sit to stand transfer with proper placement of hands and with out loss of balance to be safer and reduce his falls risk   Time 4   Period Weeks   Status Achieved     PT SHORT TERM GOAL #2   Title Patient will increase BLE gross strength to 3/5 as to improve functional strength for independent gait, increased standing tolerance and increased ADL ability.   Time 4   Period Weeks   Status Achieved           PT Long Term Goals - 10/21/16 1505      PT LONG TERM GOAL #1   Title Patient will be independent in home exercise program to improve strength/mobility for better functional independence with ADLs.   Time 8   Period Weeks   Status On-going     PT LONG TERM GOAL #2   Title Patient (< 53 years old) will complete five times sit to stand test in < 10 seconds indicating an increased LE strength and improved balance.    Time 8   Period Weeks   Status On-going  PT LONG TERM GOAL #3   Title Patient will increase 10 meter walk test to >1.36m/s as to improve gait speed for better community ambulation and to reduce fall risk.   Time 8   Period Weeks   Status On-going               Plan - 11/11/16 0949    Clinical Impression Statement Pt was able to progress LE strength exercises today without increase of pain.  Pt continues to state he is pain free and is consistently performing HEP.  Pt was able to tolerate increased reps, sets, and resistance today with little difficulty.  Pt will continue to benefit from skilled therapy to further progress LE strength, dynamic balance and static balance in order to improve functional mobility, activity tolerance and decrease falls risk.   Clinical Presentation Unstable   Rehab Potential Good   PT Frequency 2x / week   PT Duration 8 weeks   PT Treatment/Interventions Gait training;Functional mobility training;Neuromuscular re-education;Balance training;Therapeutic exercise;Therapeutic activities;Patient/family education   PT Next Visit Plan Recheck goals next session    PT Home Exercise Plan continue HEP as prescribed    Consulted and Agree with Plan of Care Patient      Patient will benefit from skilled therapeutic intervention in order to improve the following deficits and impairments:  Abnormal gait, Decreased balance, Decreased endurance, Decreased mobility, Difficulty walking, Decreased activity tolerance, Decreased strength, Impaired flexibility, Decreased safety awareness, Decreased knowledge of use of DME  Visit Diagnosis: Difficulty in walking, not elsewhere classified  Muscle weakness (generalized)  Pain in left leg     Problem List Patient Active Problem List   Diagnosis Date Noted  . Cigarette smoker 06/16/2016  . Dyspnea on exertion 06/15/2016  . Dementia arising in the senium and presenium 01/07/2014  . CSA (central sleep apnea)  10/05/2013  . Syncope 06/26/2013  . COPD GOLD II 06/25/2013  . BPH (benign prostatic hyperplasia) 05/18/2013  . Glaucoma 05/18/2013  . S/P right TH revision 05/14/2013  . Constipation 03/26/2013  . Osteoporosis 03/26/2013  . Expected blood loss anemia 03/23/2013  . S/P right THA, AA 03/20/2013  . Hyperlipidemia   . Erectile dysfunction   . History of asbestos exposure   . Nocturia   . History of shingles   . Dermatitis, atopic   . OA (osteoarthritis) of hip   . Arthritis   . Frequency of urination   . Lumbar scoliosis   . Malignant neoplasm of prostate ([place]) 06/07/2011  . 83 year old gentleman with stage T3 adenocarcinoma prostate with Gleason score 4+4 and PSA of 10.3 05/11/2011   This entire session was performed under direct supervision and direction of a licensed therapist/therapist assistant . I have personally read, edited and approve of the note as written. Netta Corrigan, SPT Alanson Puls, PT, DPT  11/11/2016, 10:12 AM  [place] MAIN [facility] SERVICES [address] [place], [place], [address] Phone: [phone]   Fax:  [phone]  Name: DIONTA LARKE MRN: [id number] Date of Birth: August 30, 1931

## 2016-11-16 ENCOUNTER — Ambulatory Visit: Payer: Medicare HMO | Admitting: Physical Therapy

## 2016-11-16 DIAGNOSIS — H2512 Age-related nuclear cataract, left eye: Secondary | ICD-10-CM | POA: Diagnosis not present

## 2016-11-16 DIAGNOSIS — H25812 Combined forms of age-related cataract, left eye: Secondary | ICD-10-CM | POA: Diagnosis not present

## 2016-11-18 ENCOUNTER — Ambulatory Visit: Payer: Medicare HMO | Admitting: Physical Therapy

## 2016-11-23 ENCOUNTER — Encounter: Payer: Self-pay | Admitting: Physical Therapy

## 2016-11-23 ENCOUNTER — Ambulatory Visit: Payer: Medicare HMO | Admitting: Physical Therapy

## 2016-11-23 DIAGNOSIS — M79605 Pain in left leg: Secondary | ICD-10-CM

## 2016-11-23 DIAGNOSIS — M6281 Muscle weakness (generalized): Secondary | ICD-10-CM

## 2016-11-23 DIAGNOSIS — R262 Difficulty in walking, not elsewhere classified: Secondary | ICD-10-CM | POA: Diagnosis not present

## 2016-11-23 NOTE — Therapy (Signed)
[place] MAIN [facility] SERVICES [address] [place], [place], [address] Phone: [phone]   Fax:  [phone]  Physical Therapy Treatment/Progress Note  Patient Details  Name: Derek Carr MRN: [id number] Date of Birth: 09-May-1931 Referring Provider: Vladimir Crofts   Encounter Date: 11/23/2016      PT End of Session - 11/23/16 1038    Visit Number 17   Number of Visits 17   Date for PT Re-Evaluation 11/15/16   Authorization Type 7/10 g codes   PT Start Time 5916   PT Stop Time 1100   PT Time Calculation (min) 45 min   Equipment Utilized During Treatment Gait belt   Activity Tolerance Patient tolerated treatment well   Behavior During Therapy [facility] for tasks assessed/performed      Past Medical History:  Diagnosis Date  . Anemia   . Atherosclerosis   . Complication of anesthesia    CONFUSION - Pt's family very concerned about this  . DDD (degenerative disc disease)   . Degenerative arthritis   . Dermatitis, atopic ARMS AND LEGS  . Diverticulosis   . Erectile dysfunction   . Frequency of urination   . Glaucoma   . History of asbestos exposure   . History of radiation therapy 8/19-13-10/18/11   prostate, 72 GY  . History of shingles 2012-- BILATERAL EYES--  NO RESIDUAL  . Hyperlipidemia   . Hypertension   . Inguinal hernia   . Lumbar scoliosis   . Nocturia   . OA (osteoarthritis) of hip RIGHT  . Prostate cancer ([place]) 05/11/2011   bx=Adenocarcinoma,gleason3+4=7, 4+4=8,PSA=10.30volume=45.7cc  . Renal cyst    bilateral  . Smokers' cough ([place])   . Thyroid cyst     Past Surgical History:  Procedure Laterality Date  . ATTEMPTED LEFT VATS/ LEFT THORACOTOMY/ RESECTION OF THE ENORMOUS, PROBABLE BRONCHOGENIC CYST  01-04-2005  DR Arlyce Dice  . CARDIAC CATHETERIZATION  02-24-2009  DR NASHER   MINOR CORONARY ARTERY IRREGULARITIES/ NORMAL LVSF/ EF 65-70%  . COLONOSCOPY W/ POLYPECTOMY    . CYSTOSCOPY  11/15/2011   Procedure:  CYSTOSCOPY;  Surgeon: Dutch Gray, MD;  Location: [facility];  Service: Urology;  Laterality: N/A;  no seeds seen in bladder  . esophageus cyst removal  YRS AGO  . [place]  . PROSTATE BIOPSY  05/11/11   Adenocarcinoma (MD OFFICE)  . RADIOACTIVE SEED IMPLANT  11/15/2011   Procedure: RADIOACTIVE SEED IMPLANT;  Surgeon: Dutch Gray, MD;  Location: [facility];  Service: Urology;  Laterality: N/A;  Total number of seeds - 52  . REPAIR RIGHT INGUINAL HERNIA W/ MESH  09-10-1999  . TOTAL HIP ARTHROPLASTY Right 03/20/2013   Procedure: RIGHT TOTAL HIP ARTHROPLASTY ANTERIOR APPROACH;  Surgeon: [name], MD;  Location: [facility];  Service: Orthopedics;  Laterality: Right;  . TOTAL HIP REVISION Right 05/14/2013   Procedure: open reduction internal fixation REVISION RIGHT HIP ;  Surgeon: [name], MD;  Location: [facility];  Service: Orthopedics;  Laterality: Right;  . UMBILICAL HERNIA REPAIR  1998   epigastric    There were no vitals filed for this visit.      Subjective Assessment - 11/23/16 1037    Subjective Pt reports no pain today - states that overall he feels good. He is using the spc at home and outdoors.   Patient is accompained by: Family member   Pertinent History Patient an having dizziness and gait disturbance in 2015  and it has not gotten better. He had the right hip replaced in 2015 . He is having LLE leg giving away and he started falling. Now he can not always  bear weight on his leg.     Limitations Standing;Walking   How long can you stand comfortably? unknown , vairable   How long can you walk comfortably? unknown , variable   Currently in Pain? No/denies   Pain Score 0-No pain   Pain Onset More than a month ago   Multiple Pain Sites No      Treatment:  Lunging into BOSU ball BLE x 20  Gait training with SPC 1000 feet and CGA, WC following; better gait speed, better posture  Standing hip abd x 15 BLE , cues for  posture correction   Standing hip extension x 15 BLE cues to perform full ROM  Squats x 15 , 5 sec hold ,   Heel raises x 20, cues to keep his Heels on the floor  Step over 1/2 foam fwd/ bwd x 20  Step over 1/2 foam side to side x 15   Side stepping left and right x 10 feet x 5 with YTB around knees  Octane fitness x 5 mins   Patient required consistent cueing to perform exercises slowly in order to achieve proper form and desired outcome of the exercise. Patient required CGA for all dynamic standing balance activity.   Therapeutic activities: 5 x sit to stand performed with improved speed  10 MW test for gait speed with improved speed with spc                        PT Education - 11/23/16 1038    Education provided Yes   Education Details HEP and safety with steps   Person(s) Educated Patient   Methods Explanation;Demonstration;Tactile cues;Verbal cues   Comprehension Verbalized understanding;Returned demonstration          PT Short Term Goals - 11/23/16 1045      PT SHORT TERM GOAL #1   Title Patient will complete a sit to stand transfer with proper placement of hands and with out loss of balance to be safer and reduce his falls risk   Time 4   Period Weeks   Status Achieved     PT SHORT TERM GOAL #2   Title Patient will increase BLE gross strength to 3/5 as to improve functional strength for independent gait, increased standing tolerance and increased ADL ability.   Time 4   Period Weeks   Status Achieved           PT Long Term Goals - 11/23/16 1039      PT LONG TERM GOAL #1   Title Patient will be independent in home exercise program to improve strength/mobility for better functional independence with ADLs.   Time 8   Period Weeks   Status Achieved     PT LONG TERM GOAL #2   Title Patient (< 63 years old) will complete five times sit to stand test in < 10 seconds indicating an increased LE strength and improved balance.    Baseline 33.40 sec   Time 8   Period Weeks   Status Partially Met   Target Date 01/11/17     PT LONG TERM GOAL #3   Title Patient will increase 10 meter walk test to >1.63ms as to improve gait speed for better community ambulation and to reduce fall risk.  Baseline .69 sec    Time 8   Period Weeks   Status Partially Met   Target Date 01/11/17               Plan - 12/17/16 1046    Clinical Impression Statement Patient performs outcome measures and goals were reviewed with progress made towards all goals. He performs strengthening exercises without any pain behaviors. He will continue to benefit from skilled PT to improve strength in BLE and decrease his falls risk, improve quality of life and improve gait speed.    Rehab Potential Good   PT Frequency 2x / week   PT Duration 8 weeks   PT Treatment/Interventions Gait training;Functional mobility training;Neuromuscular re-education;Balance training;Therapeutic exercise;Therapeutic activities;Patient/family education   PT Next Visit Plan strengthening   PT Home Exercise Plan continue HEP as prescribed    Consulted and Agree with Plan of Care Patient      Patient will benefit from skilled therapeutic intervention in order to improve the following deficits and impairments:  Abnormal gait, Decreased balance, Decreased endurance, Decreased mobility, Difficulty walking, Decreased activity tolerance, Decreased strength, Impaired flexibility, Decreased safety awareness, Decreased knowledge of use of DME  Visit Diagnosis: Difficulty in walking, not elsewhere classified  Muscle weakness (generalized)  Pain in left leg       G-Codes - December 17, 2016 1049    Functional Assessment Tool Used (Outpatient Only) 5 x sit to stand, TUG, 10 MW,    Functional Limitation Mobility: Walking and moving around   Mobility: Walking and Moving Around Current Status [phone]) At least 60 percent but less than 80 percent impaired, limited or restricted    Mobility: Walking and Moving Around Goal Status [phone]) At least 40 percent but less than 60 percent impaired, limited or restricted      Problem List Patient Active Problem List   Diagnosis Date Noted  . Cigarette smoker 06/16/2016  . Dyspnea on exertion 06/15/2016  . Dementia arising in the senium and presenium 01/07/2014  . CSA (central sleep apnea) 10/05/2013  . Syncope 06/26/2013  . COPD GOLD II 06/25/2013  . BPH (benign prostatic hyperplasia) 05/18/2013  . Glaucoma 05/18/2013  . S/P right TH revision 05/14/2013  . Constipation 03/26/2013  . Osteoporosis 03/26/2013  . Expected blood loss anemia 03/23/2013  . S/P right THA, AA 03/20/2013  . Hyperlipidemia   . Erectile dysfunction   . History of asbestos exposure   . Nocturia   . History of shingles   . Dermatitis, atopic   . OA (osteoarthritis) of hip   . Arthritis   . Frequency of urination   . Lumbar scoliosis   . Malignant neoplasm of prostate ([place]) 06/07/2011  . 83 year old gentleman with stage T3 adenocarcinoma prostate with Gleason score 4+4 and PSA of 10.3 05/11/2011    Arelia Sneddon S,PT DPT 12-17-16, 10:53 AM  [place] MAIN [facility] SERVICES [address] [place], [place], [address] Phone: [phone]   Fax:  [phone]  Name: Derek Carr MRN: [id number] Date of Birth: December 11, 1931

## 2016-11-24 DIAGNOSIS — Z125 Encounter for screening for malignant neoplasm of prostate: Secondary | ICD-10-CM | POA: Diagnosis not present

## 2016-11-24 DIAGNOSIS — E041 Nontoxic single thyroid nodule: Secondary | ICD-10-CM | POA: Diagnosis not present

## 2016-11-24 DIAGNOSIS — E7849 Other hyperlipidemia: Secondary | ICD-10-CM | POA: Diagnosis not present

## 2016-11-24 DIAGNOSIS — M810 Age-related osteoporosis without current pathological fracture: Secondary | ICD-10-CM | POA: Diagnosis not present

## 2016-11-25 ENCOUNTER — Encounter: Payer: Self-pay | Admitting: Physical Therapy

## 2016-11-25 ENCOUNTER — Ambulatory Visit: Payer: Medicare HMO | Attending: Neurology | Admitting: Physical Therapy

## 2016-11-25 DIAGNOSIS — M79605 Pain in left leg: Secondary | ICD-10-CM

## 2016-11-25 DIAGNOSIS — R262 Difficulty in walking, not elsewhere classified: Secondary | ICD-10-CM

## 2016-11-25 DIAGNOSIS — M6281 Muscle weakness (generalized): Secondary | ICD-10-CM | POA: Diagnosis present

## 2016-11-25 NOTE — Therapy (Signed)
[place] MAIN [facility] SERVICES [address] [place], [place], [address] Phone: [phone]   Fax:  [phone]  Physical Therapy Treatment  Patient Details  Name: Derek Carr MRN: [id number] Date of Birth: [birth year] Referring Provider: Vladimir Carr   Encounter Date: 11/25/2016      PT End of Session - 11/25/16 1601    Visit Number 18   Number of Visits 17   Date for PT Re-Evaluation 11/15/16   Authorization Type 8/10 g codes   PT Start Time 0345   PT Stop Time 0430   PT Time Calculation (min) 45 min   Equipment Utilized During Treatment Gait belt   Activity Tolerance Patient tolerated treatment well   Behavior During Therapy [facility] for tasks assessed/performed      Past Medical History:  Diagnosis Date  . Anemia   . Atherosclerosis   . Complication of anesthesia    CONFUSION - Pt's family very concerned about this  . DDD (degenerative disc disease)   . Degenerative arthritis   . Dermatitis, atopic ARMS AND LEGS  . Diverticulosis   . Erectile dysfunction   . Frequency of urination   . Glaucoma   . History of asbestos exposure   . History of radiation therapy 8/19-13-10/18/11   prostate, 47 GY  . History of shingles 2012-- BILATERAL EYES--  NO RESIDUAL  . Hyperlipidemia   . Hypertension   . Inguinal hernia   . Lumbar scoliosis   . Nocturia   . OA (osteoarthritis) of hip RIGHT  . Prostate cancer ([place]) 05/11/2011   bx=Adenocarcinoma,gleason3+4=7, 4+4=8,PSA=10.30volume=45.7cc  . Renal cyst    bilateral  . Smokers' cough ([place])   . Thyroid cyst     Past Surgical History:  Procedure Laterality Date  . ATTEMPTED LEFT VATS/ LEFT THORACOTOMY/ RESECTION OF THE ENORMOUS, PROBABLE BRONCHOGENIC CYST  01-04-2005  DR Derek Carr  . CARDIAC CATHETERIZATION  02-24-2009  DR Derek Carr   MINOR CORONARY ARTERY IRREGULARITIES/ NORMAL LVSF/ EF 65-70%  . COLONOSCOPY W/ POLYPECTOMY    . CYSTOSCOPY  11/15/2011   Procedure: CYSTOSCOPY;  Surgeon:  Derek Gray, MD;  Location: [facility];  Service: Urology;  Laterality: N/A;  no seeds seen in bladder  . esophageus cyst removal  YRS AGO  . [place]  . PROSTATE BIOPSY  05/11/11   Adenocarcinoma (MD OFFICE)  . RADIOACTIVE SEED IMPLANT  11/15/2011   Procedure: RADIOACTIVE SEED IMPLANT;  Surgeon: Derek Gray, MD;  Location: [facility];  Service: Urology;  Laterality: N/A;  Total number of seeds - 52  . REPAIR RIGHT INGUINAL HERNIA W/ MESH  09-10-1999  . TOTAL HIP ARTHROPLASTY Right 03/20/2013   Procedure: RIGHT TOTAL HIP ARTHROPLASTY ANTERIOR APPROACH;  Surgeon: [name], MD;  Location: [facility];  Service: Orthopedics;  Laterality: Right;  . TOTAL HIP REVISION Right 05/14/2013   Procedure: open reduction internal fixation REVISION RIGHT HIP ;  Surgeon: [name], MD;  Location: [facility];  Service: Orthopedics;  Laterality: Right;  . UMBILICAL HERNIA REPAIR  1998   epigastric    There were no vitals filed for this visit.      Subjective Assessment - 11/25/16 1559    Subjective Pt reports no pain today - states that overall he feels good. He is using the spc at home and outdoors. He asked if there are any other types of canes that he might like.   Patient is accompained by: Family member  Pertinent History Patient an having dizziness and gait disturbance in 2015 and it has not gotten better. He had the right hip replaced in 2015 . He is having LLE leg giving away and he started falling. Now he can not always  bear weight on his leg.     Limitations Standing;Walking   How long can you stand comfortably? unknown , vairable   How long can you walk comfortably? unknown , variable   Currently in Pain? No/denies   Pain Score 0-No pain   Pain Onset More than a month ago   Multiple Pain Sites No      Treatment:             Octane fitness x 5 min warmup              Leg press at 75lbs with slow eccentric contraction x 20 reps B LE's                            Heel raises at leg press with 75lbs x 20 reps - cues for technique and form              Toe taps to bottom step with one UE support x 30 B LE's              Lateral toe taps to bottom step with one UE support x 30 B LE's              Standing hip strengthening with red theraband:                         ABD 2 x 15 B LE                         Ext  2 x 15 B LE                         Flex 2 x 15 B LE  Gait training with loftstrand crutch 1000 feet x 2 with SBA, no pain behaviors  Increased cueing required today in order to perform activities with correct form and technique                           PT Education - 11/25/16 1600    Education provided Yes   Education Details HEP   Person(s) Educated Patient   Methods Explanation;Demonstration   Comprehension Verbalized understanding;Returned demonstration;Verbal cues required          PT Short Term Goals - 11/23/16 1045      PT SHORT TERM GOAL #1   Title Patient will complete a sit to stand transfer with proper placement of hands and with out loss of balance to be safer and reduce his falls risk   Time 4   Period Weeks   Status Achieved     PT SHORT TERM GOAL #2   Title Patient will increase BLE gross strength to 3/5 as to improve functional strength for independent gait, increased standing tolerance and increased ADL ability.   Time 4   Period Weeks   Status Achieved           PT Long Term Goals - 11/23/16 1039      PT LONG TERM GOAL #1   Title Patient will be independent in home exercise  program to improve strength/mobility for better functional independence with ADLs.   Time 8   Period Weeks   Status Achieved     PT LONG TERM GOAL #2   Title Patient (< 2 years old) will complete five times sit to stand test in < 10 seconds indicating an increased LE strength and improved balance.   Baseline 33.40 sec   Time 8   Period Weeks   Status Partially Met   Target  Date 01/11/17     PT LONG TERM GOAL #3   Title Patient will increase 10 meter walk test to >1.68ms as to improve gait speed for better community ambulation and to reduce fall risk.   Baseline .69 sec    Time 8   Period Weeks   Status Partially Met   Target Date 01/11/17               Plan - 11/25/16 1601    Clinical Impression Statement Pt was able to progress through balance and strength exercises today without increase in pain. He was instructed in gait training with loftstrand crutch.  Pt continues to demonstrate improvement with dynamic and static balance, with decreased LOB noted with dynamic activities on even and uneven surfaces.  Patient has difficulty with especially  single leg weight bearing during balance activities.  Pt has decreased single leg stability with L > R.  Pt would continue to benefit from skilled therapy services in order to continue strengthening B LE's and improving dynamic and static balance, especially single limb stability   Rehab Potential Good   PT Frequency 2x / week   PT Duration 8 weeks   PT Treatment/Interventions Gait training;Functional mobility training;Neuromuscular re-education;Balance training;Therapeutic exercise;Therapeutic activities;Patient/family education   PT Next Visit Plan strengthening   PT Home Exercise Plan continue HEP as prescribed    Consulted and Agree with Plan of Care Patient      Patient will benefit from skilled therapeutic intervention in order to improve the following deficits and impairments:  Abnormal gait, Decreased balance, Decreased endurance, Decreased mobility, Difficulty walking, Decreased activity tolerance, Decreased strength, Impaired flexibility, Decreased safety awareness, Decreased knowledge of use of DME  Visit Diagnosis: Difficulty in walking, not elsewhere classified  Muscle weakness (generalized)  Pain in left leg     Problem List Patient Active Problem List   Diagnosis Date Noted  .  Cigarette smoker 06/16/2016  . Dyspnea on exertion 06/15/2016  . Dementia arising in the senium and presenium 01/07/2014  . CSA (central sleep apnea) 10/05/2013  . Syncope 06/26/2013  . COPD GOLD II 06/25/2013  . BPH (benign prostatic hyperplasia) 05/18/2013  . Glaucoma 05/18/2013  . S/P right TH revision 05/14/2013  . Constipation 03/26/2013  . Osteoporosis 03/26/2013  . Expected blood loss anemia 03/23/2013  . S/P right THA, AA 03/20/2013  . Hyperlipidemia   . Erectile dysfunction   . History of asbestos exposure   . Nocturia   . History of shingles   . Dermatitis, atopic   . OA (osteoarthritis) of hip   . Arthritis   . Frequency of urination   . Lumbar scoliosis   . [place]  . 83year old gentleman with stage T3 adenocarcinoma prostate with Gleason score 4+4 and PSA of 10.3 05/11/2011    MAlanson Puls PT DPT 11/25/2016, 4:03 PM  [place] [facility] [address] [address]Phone: [phone]  Fax:  [phone] Name:  Derek Carr MRN: [id number] Date of Birth: 1931/10/18

## 2016-11-29 ENCOUNTER — Other Ambulatory Visit: Payer: Self-pay | Admitting: Internal Medicine

## 2016-11-29 DIAGNOSIS — G458 Other transient cerebral ischemic attacks and related syndromes: Secondary | ICD-10-CM | POA: Diagnosis not present

## 2016-11-29 DIAGNOSIS — M5136 Other intervertebral disc degeneration, lumbar region: Secondary | ICD-10-CM | POA: Diagnosis not present

## 2016-11-29 DIAGNOSIS — H538 Other visual disturbances: Secondary | ICD-10-CM | POA: Diagnosis not present

## 2016-11-29 DIAGNOSIS — M79659 Pain in unspecified thigh: Secondary | ICD-10-CM | POA: Diagnosis not present

## 2016-11-29 DIAGNOSIS — M810 Age-related osteoporosis without current pathological fracture: Secondary | ICD-10-CM | POA: Diagnosis not present

## 2016-11-29 DIAGNOSIS — C61 Malignant neoplasm of prostate: Secondary | ICD-10-CM | POA: Diagnosis not present

## 2016-11-29 DIAGNOSIS — Z23 Encounter for immunization: Secondary | ICD-10-CM | POA: Diagnosis not present

## 2016-11-29 DIAGNOSIS — E041 Nontoxic single thyroid nodule: Secondary | ICD-10-CM

## 2016-11-29 DIAGNOSIS — I714 Abdominal aortic aneurysm, without rupture: Secondary | ICD-10-CM | POA: Diagnosis not present

## 2016-11-29 DIAGNOSIS — B029 Zoster without complications: Secondary | ICD-10-CM | POA: Diagnosis not present

## 2016-11-29 DIAGNOSIS — I10 Essential (primary) hypertension: Secondary | ICD-10-CM | POA: Diagnosis not present

## 2016-11-29 DIAGNOSIS — Z0000 Encounter for general adult medical examination without abnormal findings: Secondary | ICD-10-CM | POA: Diagnosis not present

## 2016-11-30 ENCOUNTER — Ambulatory Visit: Payer: Medicare HMO | Admitting: Physical Therapy

## 2016-11-30 DIAGNOSIS — R42 Dizziness and giddiness: Secondary | ICD-10-CM | POA: Diagnosis not present

## 2016-11-30 DIAGNOSIS — G608 Other hereditary and idiopathic neuropathies: Secondary | ICD-10-CM | POA: Diagnosis not present

## 2016-11-30 DIAGNOSIS — R2689 Other abnormalities of gait and mobility: Secondary | ICD-10-CM | POA: Diagnosis not present

## 2016-11-30 DIAGNOSIS — R269 Unspecified abnormalities of gait and mobility: Secondary | ICD-10-CM | POA: Diagnosis not present

## 2016-11-30 DIAGNOSIS — G4733 Obstructive sleep apnea (adult) (pediatric): Secondary | ICD-10-CM | POA: Diagnosis not present

## 2016-12-02 ENCOUNTER — Ambulatory Visit: Payer: Medicare HMO | Admitting: Physical Therapy

## 2016-12-02 ENCOUNTER — Encounter: Payer: Self-pay | Admitting: Physical Therapy

## 2016-12-02 DIAGNOSIS — M6281 Muscle weakness (generalized): Secondary | ICD-10-CM

## 2016-12-02 DIAGNOSIS — R262 Difficulty in walking, not elsewhere classified: Secondary | ICD-10-CM

## 2016-12-02 DIAGNOSIS — M79605 Pain in left leg: Secondary | ICD-10-CM

## 2016-12-02 NOTE — Therapy (Signed)
[place] MAIN [facility] SERVICES [address] [place], [place], [address] Phone: [phone]   Fax:  [phone]  Physical Therapy Treatment/ Discharge summary  Patient Details  Name: Derek Carr MRN: [id number] Date of Birth: August 08, 1931 Referring Provider: Vladimir Crofts    Encounter Date: 12/02/2016  PT End of Session - 12/02/16 1621    Visit Number  19    Number of Visits  34    Date for PT Re-Evaluation  01/09/17    Authorization Type  9/10 g codes    PT Start Time  0400    PT Stop Time  0445    PT Time Calculation (min)  45 min    Equipment Utilized During Treatment  Gait belt    Activity Tolerance  Patient tolerated treatment well    Behavior During Therapy  [facility] for tasks assessed/performed       Past Medical History:  Diagnosis Date  . Anemia   . Atherosclerosis   . Complication of anesthesia    CONFUSION - Pt's family very concerned about this  . DDD (degenerative disc disease)   . Degenerative arthritis   . Dermatitis, atopic ARMS AND LEGS  . Diverticulosis   . Erectile dysfunction   . Frequency of urination   . Glaucoma   . History of asbestos exposure   . History of radiation therapy 8/19-13-10/18/11   prostate, 47 GY  . History of shingles 2012-- BILATERAL EYES--  NO RESIDUAL  . Hyperlipidemia   . Hypertension   . Inguinal hernia   . Lumbar scoliosis   . Nocturia   . OA (osteoarthritis) of hip RIGHT  . Prostate cancer ([place]) 05/11/2011   bx=Adenocarcinoma,gleason3+4=7, 4+4=8,PSA=10.30volume=45.7cc  . Renal cyst    bilateral  . Smokers' cough ([place])   . Thyroid cyst     Past Surgical History:  Procedure Laterality Date  . ATTEMPTED LEFT VATS/ LEFT THORACOTOMY/ RESECTION OF THE ENORMOUS, PROBABLE BRONCHOGENIC CYST  01-04-2005  DR Arlyce Dice  . CARDIAC CATHETERIZATION  02-24-2009  DR NASHER   MINOR CORONARY ARTERY IRREGULARITIES/ NORMAL LVSF/ EF 65-70%  . COLONOSCOPY W/ POLYPECTOMY    . esophageus cyst removal   YRS AGO  . [place]  . PROSTATE BIOPSY  05/11/11   Adenocarcinoma (MD OFFICE)  . REPAIR RIGHT INGUINAL HERNIA W/ MESH  09-10-1999  . UMBILICAL HERNIA REPAIR  1998   epigastric    There were no vitals filed for this visit.  Subjective Assessment - 12/02/16 1619    Subjective  Patient saw the MD yesterday and he is so pleased with the progress.     Currently in Pain?  No/denies    Pain Score  0-No pain    Multiple Pain Sites  No      Treatment: Supine:  bridges,  hooklying ER/abd,  marching in hooklying x 15 BLE sidelying hip abd BLE x 15 Standing :hip abd,  hip extension with BTB x 15 Seated:  LAQ, hip flex x 15 BLE Marching x 15 BLE Standing heel raises x 15  Patient needs cues for posture and technique.                         PT Education - 12/02/16 1628    Education provided  Yes    Education Details  HEP    Person(s) Educated  Patient    Methods  Explanation;Verbal cues    Comprehension  Verbalized understanding;Returned  demonstration;Verbal cues required       PT Short Term Goals - 11/23/16 1045      PT SHORT TERM GOAL #1   Title  Patient will complete a sit to stand transfer with proper placement of hands and with out loss of balance to be safer and reduce his falls risk    Time  4    Period  Weeks    Status  Achieved      PT SHORT TERM GOAL #2   Title  Patient will increase BLE gross strength to 3/5 as to improve functional strength for independent gait, increased standing tolerance and increased ADL ability.    Time  4    Period  Weeks    Status  Achieved        PT Long Term Goals - 11/23/16 1039      PT LONG TERM GOAL #1   Title  Patient will be independent in home exercise program to improve strength/mobility for better functional independence with ADLs.    Time  8    Period  Weeks    Status  Achieved      PT LONG TERM GOAL #2   Title  Patient (83 years old) will complete five times sit to stand  test in < 10 seconds indicating an increased LE strength and improved balance.    Baseline  33.40 sec    Time  8    Period  Weeks    Status  Partially Met    Target Date  01/11/17      PT LONG TERM GOAL #3   Title  Patient will increase 10 meter walk test to >1.56ms as to improve gait speed for better community ambulation and to reduce fall risk.    Baseline  .69 sec     Time  8    Period  Weeks    Status  Partially Met    Target Date  01/11/17            Plan - [phone]    Clinical Impression Statement  patient performs strengthening exercises for BLE in open chain and closed chain without pain behaviors. He is able to ambulate with spc  short and intermediate distances wihtout pain. He [place] HEP for safety and strengthening. He will be discharged from skillled PT to HEP.     Rehab Potential  Good    PT Frequency  2x / week    PT Treatment/Interventions  Gait training;Functional mobility training;Neuromuscular re-education;Balance training;Therapeutic exercise;Therapeutic activities;Patient/family education    PT Next Visit Plan  strengthening    PT Home Exercise Plan  continue HEP as prescribed     Consulted and Agree with Plan of Care  Patient       Patient will benefit from skilled therapeutic intervention in order to improve the following deficits and impairments:  Abnormal gait, Decreased balance, Decreased endurance, Decreased mobility, Difficulty walking, Decreased activity tolerance, Decreased strength, Impaired flexibility, Decreased safety awareness, Decreased knowledge of use of DME  Visit Diagnosis: Difficulty in walking, not elsewhere classified  Muscle weakness (generalized)  Pain in left leg   G-Codes - 1Nov 20, [id number]    Functional Assessment Tool Used (Outpatient Only)  5 x sit to stand, TUG, 10 MW,     Functional Limitation  Mobility: Walking and moving around    Mobility: Walking and Moving Around Current Status  (([id number]  At least 60 percent but less than 80  percent impaired, limited or restricted    Mobility: Walking and Moving Around Goal Status [phone])  At least 40 percent but less than 60 percent impaired, limited or restricted    Mobility: Walking and Moving Around Discharge Status [phone])  At least 40 percent but less than 60 percent impaired, limited or restricted       Problem List Patient Active Problem List   Diagnosis Date Noted  . Cigarette smoker 06/16/2016  . Dyspnea on exertion 06/15/2016  . Dementia arising in the senium and presenium 01/07/2014  . CSA (central sleep apnea) 10/05/2013  . Syncope 06/26/2013  . COPD GOLD II 06/25/2013  . BPH (benign prostatic hyperplasia) 05/18/2013  . Glaucoma 05/18/2013  . S/P right TH revision 05/14/2013  . Constipation 03/26/2013  . Osteoporosis 03/26/2013  . Expected blood loss anemia 03/23/2013  . S/P right THA, AA 03/20/2013  . Hyperlipidemia   . Erectile dysfunction   . History of asbestos exposure   . Nocturia   . History of shingles   . Dermatitis, atopic   . OA (osteoarthritis) of hip   . Arthritis   . Frequency of urination   . Lumbar scoliosis   . Malignant neoplasm of prostate ([place]) 06/07/2011  . 83 year old gentleman with stage T3 adenocarcinoma prostate with Gleason score 4+4 and PSA of 10.3 05/11/2011    Alanson Puls, PT DPT 12/02/2016, 5:04 PM  [place] MAIN [facility] SERVICES [place], [place], [address] Phone: [phone]   Fax:  [phone]  Name: CHETAN MEHRING MRN: [id number] Date of Birth: 01/09/1932

## 2016-12-03 ENCOUNTER — Other Ambulatory Visit: Payer: Medicare HMO

## 2016-12-07 ENCOUNTER — Ambulatory Visit: Payer: Medicare HMO | Admitting: Physical Therapy

## 2016-12-07 DIAGNOSIS — I10 Essential (primary) hypertension: Secondary | ICD-10-CM | POA: Diagnosis not present

## 2016-12-07 DIAGNOSIS — R82998 Other abnormal findings in urine: Secondary | ICD-10-CM | POA: Diagnosis not present

## 2016-12-13 DIAGNOSIS — H25011 Cortical age-related cataract, right eye: Secondary | ICD-10-CM | POA: Diagnosis not present

## 2016-12-13 DIAGNOSIS — H2511 Age-related nuclear cataract, right eye: Secondary | ICD-10-CM | POA: Diagnosis not present

## 2016-12-14 ENCOUNTER — Ambulatory Visit: Payer: Medicare HMO | Admitting: Physical Therapy

## 2016-12-20 ENCOUNTER — Ambulatory Visit: Payer: Medicare HMO | Admitting: Physical Therapy

## 2016-12-21 ENCOUNTER — Inpatient Hospital Stay (HOSPITAL_COMMUNITY): Admission: RE | Admit: 2016-12-21 | Payer: Medicare HMO | Source: Ambulatory Visit

## 2016-12-22 ENCOUNTER — Ambulatory Visit: Payer: Medicare HMO | Admitting: Physical Therapy

## 2016-12-23 ENCOUNTER — Other Ambulatory Visit: Payer: Self-pay

## 2016-12-23 ENCOUNTER — Emergency Department (HOSPITAL_COMMUNITY): Payer: Medicare HMO

## 2016-12-23 ENCOUNTER — Encounter (HOSPITAL_COMMUNITY): Payer: Self-pay | Admitting: Emergency Medicine

## 2016-12-23 ENCOUNTER — Emergency Department (HOSPITAL_COMMUNITY)
Admission: EM | Admit: 2016-12-23 | Discharge: 2016-12-23 | Disposition: A | Discharge status: Home / Self Care | Payer: Medicare HMO | Attending: Emergency Medicine | Admitting: Emergency Medicine

## 2016-12-23 DIAGNOSIS — R479 Unspecified speech disturbances: Secondary | ICD-10-CM | POA: Insufficient documentation

## 2016-12-23 DIAGNOSIS — Z79899 Other long term (current) drug therapy: Secondary | ICD-10-CM | POA: Insufficient documentation

## 2016-12-23 DIAGNOSIS — R55 Syncope and collapse: Secondary | ICD-10-CM | POA: Diagnosis not present

## 2016-12-23 DIAGNOSIS — F0390 Unspecified dementia without behavioral disturbance: Secondary | ICD-10-CM | POA: Insufficient documentation

## 2016-12-23 DIAGNOSIS — R4020 Unspecified coma: Secondary | ICD-10-CM | POA: Diagnosis not present

## 2016-12-23 DIAGNOSIS — Z96641 Presence of right artificial hip joint: Secondary | ICD-10-CM | POA: Insufficient documentation

## 2016-12-23 DIAGNOSIS — J449 Chronic obstructive pulmonary disease, unspecified: Secondary | ICD-10-CM | POA: Diagnosis not present

## 2016-12-23 DIAGNOSIS — R4781 Slurred speech: Secondary | ICD-10-CM | POA: Diagnosis not present

## 2016-12-23 DIAGNOSIS — I6789 Other cerebrovascular disease: Secondary | ICD-10-CM | POA: Diagnosis not present

## 2016-12-23 DIAGNOSIS — I10 Essential (primary) hypertension: Secondary | ICD-10-CM | POA: Diagnosis not present

## 2016-12-23 DIAGNOSIS — R69 Illness, unspecified: Secondary | ICD-10-CM | POA: Diagnosis not present

## 2016-12-23 DIAGNOSIS — Z7982 Long term (current) use of aspirin: Secondary | ICD-10-CM | POA: Insufficient documentation

## 2016-12-23 DIAGNOSIS — F17210 Nicotine dependence, cigarettes, uncomplicated: Secondary | ICD-10-CM | POA: Insufficient documentation

## 2016-12-23 DIAGNOSIS — R61 Generalized hyperhidrosis: Secondary | ICD-10-CM | POA: Diagnosis not present

## 2016-12-23 DIAGNOSIS — Z7902 Long term (current) use of antithrombotics/antiplatelets: Secondary | ICD-10-CM | POA: Insufficient documentation

## 2016-12-23 LAB — COMPREHENSIVE METABOLIC PANEL
ALK PHOS: 67 U/L (ref 38–126)
ALT: 20 U/L (ref 17–63)
ANION GAP: 7 (ref 5–15)
AST: 18 U/L (ref 15–41)
Albumin: 3.2 g/dL — ABNORMAL LOW (ref 3.5–5.0)
BUN: 15 mg/dL (ref 6–20)
CALCIUM: 8.6 mg/dL — AB (ref 8.9–10.3)
CO2: 20 mmol/L — AB (ref 22–32)
Chloride: 113 mmol/L — ABNORMAL HIGH (ref 101–111)
Creatinine, Ser: 1.33 mg/dL — ABNORMAL HIGH (ref 0.61–1.24)
GFR, EST AFRICAN AMERICAN: 55 mL/min — AB (ref >60)
GFR, EST NON AFRICAN AMERICAN: 48 mL/min — AB (ref >60)
Glucose, Bld: 108 mg/dL — ABNORMAL HIGH (ref 65–99)
Potassium: 3 mmol/L — ABNORMAL LOW (ref 3.5–5.1)
SODIUM: 140 mmol/L (ref 135–145)
Total Bilirubin: 0.4 mg/dL (ref 0.3–1.2)
Total Protein: 7.2 g/dL (ref 6.5–8.1)

## 2016-12-23 LAB — CBC WITH DIFFERENTIAL/PLATELET
BASOS ABS: 0 10*3/uL (ref 0.0–0.1)
BASOS PCT: 0 %
EOS ABS: 0.1 10*3/uL (ref 0.0–0.7)
Eosinophils Relative: 4 %
HEMATOCRIT: 35.8 % — AB (ref 39.0–52.0)
HEMOGLOBIN: 11.8 g/dL — AB (ref 13.0–17.0)
Lymphocytes Relative: 33 %
Lymphs Abs: 1 10*3/uL (ref 0.7–4.0)
MCH: 28.7 pg (ref 26.0–34.0)
MCHC: 33 g/dL (ref 30.0–36.0)
MCV: 87.1 fL (ref 78.0–100.0)
MONOS PCT: 7 %
Monocytes Absolute: 0.2 10*3/uL (ref 0.1–1.0)
NEUTROS ABS: 1.7 10*3/uL (ref 1.7–7.7)
NEUTROS PCT: 56 %
Platelets: 172 10*3/uL (ref 150–400)
RBC: 4.11 MIL/uL — AB (ref 4.22–5.81)
RDW: 15.5 % (ref 11.5–15.5)
WBC: 3 10*3/uL — AB (ref 4.0–10.5)

## 2016-12-23 LAB — TROPONIN I

## 2016-12-23 NOTE — ED Notes (Signed)
Patient transported to CT 

## 2016-12-23 NOTE — ED Notes (Signed)
ED Provider at bedside. 

## 2016-12-23 NOTE — Discharge Instructions (Signed)
Return if you have another episode.  Follow-up with your neurologist and Dr. Haynes Kerns as soon as possible.

## 2016-12-23 NOTE — ED Provider Notes (Signed)
[place] EMERGENCY DEPARTMENT Provider Note   CSN: [id number] Arrival date & time: 12/23/16  2006     History   Chief Complaint Chief Complaint  Patient presents with  . Weakness    HPI Derek Carr is a 83 y.o. male.  HPI Patient presents after an episode of slow speech at home.  Reportedly was eating dinner and then began to get a little diaphoretic and was slower to answer questions.  Patient's daughter says he was leaning to the left also.  Face was overall symmetric but may have had a slight droop.  He was able to move both left and right side.  He was appropriate but just answering more slowly and daughter says his voice is not as strong as usual.  But now is back to normal.  He was eating steak at the time.  No choking.  He has had an extensive recent neurologic workup for some difficulty walking.  Has a neurologist in [place] and Dr. Haynes Kerns is his primary care doctor. Past Medical History:  Diagnosis Date  . Anemia   . Atherosclerosis   . Complication of anesthesia    CONFUSION - Pt's family very concerned about this  . DDD (degenerative disc disease)   . Degenerative arthritis   . Dermatitis, atopic ARMS AND LEGS  . Diverticulosis   . Erectile dysfunction   . Frequency of urination   . Glaucoma   . History of asbestos exposure   . History of radiation therapy 8/19-13-10/18/11   prostate, 75 GY  . History of shingles 2012-- BILATERAL EYES--  NO RESIDUAL  . Hyperlipidemia   . Hypertension   . Inguinal hernia   . Lumbar scoliosis   . Nocturia   . OA (osteoarthritis) of hip RIGHT  . Prostate cancer ([place]) 05/11/2011   bx=Adenocarcinoma,gleason3+4=7, 4+4=8,PSA=10.30volume=45.7cc  . Renal cyst    bilateral  . Smokers' cough ([place])   . Thyroid cyst     Patient Active Problem List   Diagnosis Date Noted  . Cigarette smoker 06/16/2016  . Dyspnea on exertion 06/15/2016  . Dementia arising in the senium and presenium 01/07/2014  . CSA  (central sleep apnea) 10/05/2013  . Syncope 06/26/2013  . COPD GOLD II 06/25/2013  . BPH (benign prostatic hyperplasia) 05/18/2013  . Glaucoma 05/18/2013  . S/P right TH revision 05/14/2013  . Constipation 03/26/2013  . Osteoporosis 03/26/2013  . Expected blood loss anemia 03/23/2013  . S/P right THA, AA 03/20/2013  . Hyperlipidemia   . Erectile dysfunction   . History of asbestos exposure   . Nocturia   . History of shingles   . Dermatitis, atopic   . OA (osteoarthritis) of hip   . Arthritis   . Frequency of urination   . Lumbar scoliosis   . Malignant neoplasm of prostate ([place]) 06/07/2011  . 83 year old gentleman with stage T3 adenocarcinoma prostate with Gleason score 4+4 and PSA of 10.3 05/11/2011    Past Surgical History:  Procedure Laterality Date  . ATTEMPTED LEFT VATS/ LEFT THORACOTOMY/ RESECTION OF THE ENORMOUS, PROBABLE BRONCHOGENIC CYST  01-04-2005  DR Arlyce Dice  . CARDIAC CATHETERIZATION  02-24-2009  DR NASHER   MINOR CORONARY ARTERY IRREGULARITIES/ NORMAL LVSF/ EF 65-70%  . COLONOSCOPY W/ POLYPECTOMY    . CYSTOSCOPY  11/15/2011   Procedure: CYSTOSCOPY;  Surgeon: Dutch Gray, MD;  [facility]  Service: Urology;  Laterality: N/A;  no seeds seen in bladder  . esophageus cyst removal  YRS AGO  .  [place]  . PROSTATE BIOPSY  05/11/11   Adenocarcinoma (MD OFFICE)  . RADIOACTIVE SEED IMPLANT  11/15/2011   Procedure: RADIOACTIVE SEED IMPLANT;  Surgeon: Dutch Gray, MD;  Location: [facility];  Service: Urology;  Laterality: N/A;  Total number of seeds - 52  . REPAIR RIGHT INGUINAL HERNIA W/ MESH  09-10-1999  . TOTAL HIP ARTHROPLASTY Right 03/20/2013   Procedure: RIGHT TOTAL HIP ARTHROPLASTY ANTERIOR APPROACH;  Surgeon: [name], MD;  Location: [facility];  Service: Orthopedics;  Laterality: Right;  . TOTAL HIP REVISION Right 05/14/2013   Procedure: open reduction internal fixation REVISION RIGHT HIP ;  Surgeon:  [name], MD;  Location: [facility];  Service: Orthopedics;  Laterality: Right;  . UMBILICAL HERNIA REPAIR  1998   epigastric       Home Medications    Prior to Admission medications   Medication Sig Start Date End Date Taking? Authorizing Provider  acetaminophen (TYLENOL) 500 MG tablet Take 1,000 mg by mouth 2 (two) times daily as needed (for pain).    Yes [provider]  aspirin 81 MG tablet Take 81 mg by mouth daily.   Yes [provider]  Calcium Carbonate Antacid 1177 MG CHEW Chew 1 tablet by mouth daily.    Yes [provider]  Cholecalciferol (VITAMIN D-3 PO) Take 1 capsule by mouth daily.    Yes [provider]  clopidogrel (PLAVIX) 75 MG tablet Take 75 mg by mouth daily.   Yes [provider]  Cyanocobalamin (VITAMIN B12 PO) Take 1 tablet by mouth daily.    Yes [provider]  Docosahexaenoic Acid (DHA OMEGA 3 PO) Take 1,000 mg by mouth daily.    Yes [provider]  ezetimibe (ZETIA) 10 MG tablet Take 10 mg by mouth every morning.    Yes [provider]  folic acid (FOLVITE) 1 MG tablet Take 1 mg by mouth daily.   Yes [provider]  ibuprofen (ADVIL,MOTRIN) 200 MG tablet Take 400 mg by mouth every morning.   Yes [provider]  irbesartan (AVAPRO) 75 MG tablet Take 37.5 mg by mouth daily. **REPLACES VALSARTAN** 11/28/16  Yes [provider]  mirabegron ER (MYRBETRIQ) 50 MG TB24 tablet Take 50 mg by mouth daily.  06/28/13  Yes [provider]  pravastatin (PRAVACHOL) 80 MG tablet Take 80 mg by mouth every evening.  08/17/10  Yes [provider]  Ubiquinol 100 MG CAPS Take 100 mg by mouth daily.   Yes [provider]  valACYclovir (VALTREX) 1000 MG tablet Take 1,000 mg by mouth 3 (three) times daily.  02/03/16  Yes [provider]  clobetasol cream (TEMOVATE) 5.17 % Apply 1 application topically 2 (two) times daily. DO NOT USE ON THE FACE Patient  not taking: Reported on 12/23/2016 08/26/13   Charlann Lange, PA-C  valsartan (DIOVAN) 40 MG tablet TK 1 T PO D 11/11/15   [provider]    Family History Family History  Problem Relation Age of Onset  . Hypertension Mother   . Prostate cancer Brother        surgery/cured  . Lung cancer Brother        new primary/same brother    Social History Social History   Tobacco Use  . Smoking status: Current Every Day Smoker    Packs/day: 1.00    Years: 62.00    Pack years: 62.00    Types: Cigarettes  . Smokeless  tobacco: Never Used  Substance Use Topics  . Alcohol use: No    Alcohol/week: 0.6 oz    Types: 1 Cans of beer per week  . Drug use: No    Comment: quit smoking 02/2012     Allergies   Dilaudid [hydromorphone hcl]; Methocarbamol; Percodan [oxycodone-aspirin]; Tramadol; and Vicodin [hydrocodone-acetaminophen]   Review of Systems Review of Systems  Constitutional: Positive for diaphoresis. Negative for appetite change and fever.  HENT: Negative for congestion.   Respiratory: Negative for cough and shortness of breath.   Cardiovascular: Negative for chest pain.  Gastrointestinal: Negative for abdominal pain.  Endocrine: Negative for polyuria.  Genitourinary: Negative for flank pain.  Musculoskeletal: Negative for back pain.  Skin: Negative for pallor and rash.  Neurological: Positive for speech difficulty.  Hematological: Negative for adenopathy.  Psychiatric/Behavioral: Negative for confusion.     Physical Exam Updated Vital Signs BP 125/83   Pulse (!) 57   Resp 17   Ht 6\' 3"  (1.905 m)   Wt 81.6 kg (180 lb)   SpO2 98%   BMI 22.50 kg/m   Physical Exam  Constitutional: He is oriented to person, place, and time. He appears well-developed and well-nourished.  HENT:  Head: Normocephalic and atraumatic.  Eyes: EOM are normal. Pupils are equal, round, and reactive to light.  Neck: Neck supple.  Cardiovascular: Normal rate.  Pulmonary/Chest: Effort  normal. He has no wheezes.  Abdominal: Soft.  Musculoskeletal: He exhibits no tenderness.  Neurological: He is alert and oriented to person, place, and time.  Face appears symmetric.  Normal speech.  Eye movements intact.  Pupils reactive.  Good grip strength bilaterally.  Good flexion-extension of upper extremities.  Good straight leg raise bilaterally.  Skin: Skin is warm. Capillary refill takes less than 2 seconds.     ED Treatments / Results  Labs (all labs ordered are listed, but only abnormal results are displayed) Labs Reviewed  COMPREHENSIVE METABOLIC PANEL - Abnormal; Notable for the following components:      Result Value   Potassium 3.0 (*)    Chloride 113 (*)    CO2 20 (*)    Glucose, Bld 108 (*)    Creatinine, Ser 1.33 (*)    Calcium 8.6 (*)    Albumin 3.2 (*)    GFR calc non Af Amer 48 (*)    GFR calc Af Amer 55 (*)    All other components within normal limits  CBC WITH DIFFERENTIAL/PLATELET - Abnormal; Notable for the following components:   WBC 3.0 (*)    RBC 4.11 (*)    Hemoglobin 11.8 (*)    HCT 35.8 (*)    All other components within normal limits  TROPONIN I    EKG  EKG Interpretation  Date/Time:  Thursday December 23 2016 20:08:44 EST Ventricular Rate:  67 PR Interval:    QRS Duration: 152 QT Interval:  367 QTC Calculation: 388 R Axis:   -66 Text Interpretation:  Sinus rhythm RBBB and LAFB Left ventricular hypertrophy Anterior Q waves, possibly due to LVH Confirmed by Davonna Belling [phone]) on 12/23/2016 11:01:29 PM       Radiology Dg Chest 2 View  Result Date: 12/23/2016 CLINICAL DATA:  Near syncope EXAM: CHEST  2 VIEW COMPARISON:  06/15/2016 FINDINGS: Linear atelectasis or scar at the bases. No pleural effusion or consolidation. Normal cardiomediastinal silhouette with atherosclerosis. No pneumothorax. IMPRESSION: No active cardiopulmonary disease. Electronically Signed   By: Donavan Foil M.D.   On: 12/23/2016 21:22  Ct Head Wo  Contrast  Result Date: 12/23/2016 CLINICAL DATA:  Altered level of consciousness, unexplained. EXAM: CT HEAD WITHOUT CONTRAST TECHNIQUE: Contiguous axial images were obtained from the base of the skull through the vertex without intravenous contrast. COMPARISON:  Head CT 02/11/2016.  Brain MRI 04/15/2016 FINDINGS: Brain: Stable degree of atrophy and chronic small vessel ischemia. No intracranial hemorrhage, mass effect, or midline shift. No hydrocephalus. The basilar cisterns are patent. No evidence of territorial infarct or acute ischemia. No extra-axial or intracranial fluid collection. Vascular: Atherosclerosis of skullbase vasculature without hyperdense vessel or abnormal calcification. Skull: No fracture or focal lesion. Sinuses/Orbits: Chronic opacification of lower right mastoid air cells, unchanged. No acute finding. Left cataract resection. Other: None. IMPRESSION: 1.  No acute intracranial abnormality. 2. Unchanged atrophy and chronic small vessel ischemia. Electronically Signed   By: Jeb Levering M.D.   On: 12/23/2016 22:21    Procedures Procedures (including critical care time)  Medications Ordered in ED Medications - No data to display   Initial Impression / Assessment and Plan / ED Course  I have reviewed the triage vital signs and the nursing notes.  Pertinent labs & imaging results that were available during my care of the patient were reviewed by me and considered in my medical decision making (see chart for details).     Patient had episode of diaphoresis and slow speech today.  Was slightly leaning to the left when happened.  Overall rather benign exam right now.  Has had a somewhat recent extensive neurologic workup.  Had brain MRI 8 months ago.  Has had MRAs also.  Has a neurologist.  I doubt this was a TIA, particularly with the diaphoresis.  I think it is reasonable to follow-up with Dr. Joylene Draft, his primary care doctor, and with his neurologist.  Will return if he has a  return of the symptoms.  Seizure also felt less likely.  Doubt arrhythmia.  Final Clinical Impressions(s) / ED Diagnoses   Final diagnoses:  Diaphoresis  Difficulty speaking    ED Discharge Orders    None       Davonna Belling, MD 12/23/16 2359

## 2016-12-23 NOTE — ED Notes (Signed)
Patient returned from XRAY 

## 2016-12-23 NOTE — ED Triage Notes (Signed)
Per EMS, pt coming from home with complaints of sudden LOC. Pt family noticed a R sided facial droop, L arm weakness, and slurred speech. These symptoms lasted around 15 minutes then resolved. Pt denies headache and blurred vision. Pt has history of stroke, TIA, and AAA. CBG 143.  NIH 0. VAN Negative.

## 2016-12-23 NOTE — ED Notes (Signed)
Patient transported to X-ray 

## 2016-12-24 ENCOUNTER — Ambulatory Visit
Admission: RE | Admit: 2016-12-24 | Discharge: 2016-12-24 | Disposition: A | Discharge status: Home / Self Care | Payer: Medicare HMO | Source: Ambulatory Visit | Attending: Internal Medicine | Admitting: Internal Medicine

## 2016-12-24 DIAGNOSIS — E042 Nontoxic multinodular goiter: Secondary | ICD-10-CM | POA: Diagnosis not present

## 2016-12-24 DIAGNOSIS — E041 Nontoxic single thyroid nodule: Secondary | ICD-10-CM

## 2016-12-27 ENCOUNTER — Ambulatory Visit: Payer: Medicare HMO | Admitting: Physical Therapy

## 2016-12-29 ENCOUNTER — Ambulatory Visit: Payer: Medicare HMO | Admitting: Physical Therapy

## 2016-12-30 DIAGNOSIS — R42 Dizziness and giddiness: Secondary | ICD-10-CM | POA: Diagnosis not present

## 2016-12-30 DIAGNOSIS — R490 Dysphonia: Secondary | ICD-10-CM | POA: Diagnosis not present

## 2016-12-30 DIAGNOSIS — E041 Nontoxic single thyroid nodule: Secondary | ICD-10-CM | POA: Diagnosis not present

## 2016-12-30 DIAGNOSIS — R1312 Dysphagia, oropharyngeal phase: Secondary | ICD-10-CM | POA: Diagnosis not present

## 2016-12-30 DIAGNOSIS — H903 Sensorineural hearing loss, bilateral: Secondary | ICD-10-CM | POA: Diagnosis not present

## 2017-01-03 ENCOUNTER — Ambulatory Visit: Payer: Medicare HMO | Admitting: Physical Therapy

## 2017-01-05 ENCOUNTER — Ambulatory Visit: Payer: Medicare HMO | Admitting: Physical Therapy

## 2017-01-05 DIAGNOSIS — G458 Other transient cerebral ischemic attacks and related syndromes: Secondary | ICD-10-CM | POA: Diagnosis not present

## 2017-01-05 DIAGNOSIS — R1319 Other dysphagia: Secondary | ICD-10-CM | POA: Diagnosis not present

## 2017-01-05 DIAGNOSIS — I10 Essential (primary) hypertension: Secondary | ICD-10-CM | POA: Diagnosis not present

## 2017-01-05 DIAGNOSIS — Z6824 Body mass index (BMI) 24.0-24.9, adult: Secondary | ICD-10-CM | POA: Diagnosis not present

## 2017-01-07 ENCOUNTER — Other Ambulatory Visit (HOSPITAL_COMMUNITY): Payer: Self-pay | Admitting: Internal Medicine

## 2017-01-07 ENCOUNTER — Ambulatory Visit (HOSPITAL_COMMUNITY)
Admission: RE | Admit: 2017-01-07 | Discharge: 2017-01-07 | Disposition: A | Discharge status: Home / Self Care | Payer: Medicare HMO | Source: Ambulatory Visit | Attending: Vascular Surgery | Admitting: Vascular Surgery

## 2017-01-07 DIAGNOSIS — I714 Abdominal aortic aneurysm, without rupture, unspecified: Secondary | ICD-10-CM

## 2017-01-10 ENCOUNTER — Telehealth: Payer: Self-pay | Admitting: Neurology

## 2017-01-10 NOTE — Telephone Encounter (Signed)
Yes, it is fine to switch- he hasn't been seen by me in over 3 years and is not an active patient and  his chief concern is TIA_  please schedule with stroke MDs. Thank you.

## 2017-01-10 NOTE — Telephone Encounter (Signed)
Ok with me 

## 2017-01-10 NOTE — Telephone Encounter (Signed)
We received a referral from Dr. Joylene Draft for pt to be seen here for TIA. He last saw Dr. Brett Fairy in 2015 for sleep apnea, but is requesing to see Dr. Leonie Man for this. Would that be ok with both of you?

## 2017-01-11 ENCOUNTER — Other Ambulatory Visit: Payer: Self-pay | Admitting: Otolaryngology

## 2017-01-11 DIAGNOSIS — R1312 Dysphagia, oropharyngeal phase: Secondary | ICD-10-CM

## 2017-01-11 DIAGNOSIS — R42 Dizziness and giddiness: Secondary | ICD-10-CM | POA: Diagnosis not present

## 2017-01-27 DIAGNOSIS — G4731 Primary central sleep apnea: Secondary | ICD-10-CM | POA: Diagnosis not present

## 2017-01-27 DIAGNOSIS — R351 Nocturia: Secondary | ICD-10-CM | POA: Diagnosis not present

## 2017-01-27 DIAGNOSIS — R0602 Shortness of breath: Secondary | ICD-10-CM | POA: Diagnosis not present

## 2017-01-27 DIAGNOSIS — Z471 Aftercare following joint replacement surgery: Secondary | ICD-10-CM | POA: Diagnosis not present

## 2017-01-28 ENCOUNTER — Ambulatory Visit: Payer: Medicare HMO

## 2017-01-31 ENCOUNTER — Ambulatory Visit
Admission: RE | Admit: 2017-01-31 | Discharge: 2017-01-31 | Disposition: A | Discharge status: Home / Self Care | Payer: Medicare HMO | Source: Ambulatory Visit | Attending: Otolaryngology | Admitting: Otolaryngology

## 2017-01-31 DIAGNOSIS — R1312 Dysphagia, oropharyngeal phase: Secondary | ICD-10-CM | POA: Diagnosis not present

## 2017-01-31 DIAGNOSIS — R1310 Dysphagia, unspecified: Secondary | ICD-10-CM | POA: Diagnosis not present

## 2017-01-31 DIAGNOSIS — R05 Cough: Secondary | ICD-10-CM | POA: Diagnosis not present

## 2017-01-31 NOTE — Therapy (Signed)
[place], [place], [address] Phone: [phone]   Fax:     Modified Barium Swallow  Patient Details  Name: Derek Carr MRN: [id number] Date of Birth: [birth year] No Data Recorded  Encounter Date: 01/31/2017    Past Medical History:  Diagnosis Date  . Anemia   . Atherosclerosis   . Complication of anesthesia    CONFUSION - Pt's family very concerned about this  . DDD (degenerative disc disease)   . Degenerative arthritis   . Dermatitis, atopic ARMS AND LEGS  . Diverticulosis   . Erectile dysfunction   . Frequency of urination   . Glaucoma   . History of asbestos exposure   . History of radiation therapy 8/19-13-10/18/11   prostate, 15 GY  . History of shingles 2012-- BILATERAL EYES--  NO RESIDUAL  . Hyperlipidemia   . Hypertension   . Inguinal hernia   . Lumbar scoliosis   . Nocturia   . OA (osteoarthritis) of hip RIGHT  . Prostate cancer ([place]) 05/11/2011   bx=Adenocarcinoma,gleason3+4=7, 4+4=8,PSA=10.30volume=45.7cc  . Renal cyst    bilateral  . Smokers' cough ([place])   . Thyroid cyst     Past Surgical History:  Procedure Laterality Date  . ATTEMPTED LEFT VATS/ LEFT THORACOTOMY/ RESECTION OF THE ENORMOUS, PROBABLE BRONCHOGENIC CYST  01-04-2005  DR Arlyce Dice  . CARDIAC CATHETERIZATION  02-24-2009  DR NASHER   MINOR CORONARY ARTERY IRREGULARITIES/ NORMAL LVSF/ EF 65-70%  . COLONOSCOPY W/ POLYPECTOMY    . CYSTOSCOPY  11/15/2011   Procedure: CYSTOSCOPY;  Surgeon: Dutch Gray, MD;  Location: [facility];  Service: Urology;  Laterality: N/A;  no seeds seen in bladder  . esophageus cyst removal  YRS AGO  . [place]  . PROSTATE BIOPSY  05/11/11   Adenocarcinoma (MD OFFICE)  . RADIOACTIVE SEED IMPLANT  11/15/2011   Procedure: RADIOACTIVE SEED IMPLANT;  Surgeon: Dutch Gray, MD;  Location: [facility];  Service: Urology;  Laterality: N/A;  Total  number of seeds - 52  . REPAIR RIGHT INGUINAL HERNIA W/ MESH  09-10-1999  . TOTAL HIP ARTHROPLASTY Right 03/20/2013   Procedure: RIGHT TOTAL HIP ARTHROPLASTY ANTERIOR APPROACH;  Surgeon: [name], MD;  Location: [facility];  Service: Orthopedics;  Laterality: Right;  . TOTAL HIP REVISION Right 05/14/2013   Procedure: open reduction internal fixation REVISION RIGHT HIP ;  Surgeon: [name], MD;  Location: [facility];  Service: Orthopedics;  Laterality: Right;  . UMBILICAL HERNIA REPAIR  1998   epigastric    There were no vitals filed for this visit.   Subjective: Patient behavior: (alertness, ability to follow instructions, etc.):  The patient is not able to independently explain his swallowing history, he is able to follow directions.  Chief complaint: Family report occasional choking/coughing wile eating.  Recent chest X-ray was negative.  CT 12/23/2016 showed no acute intracranial abnormality and unchanged atrophy and chronic small vessel ischemia.   Objective:  Radiological Procedure: A videoflouroscopic evaluation of oral-preparatory, reflex initiation, and pharyngeal phases of the swallow was performed; as well as a screening of the upper esophageal phase.  I. POSTURE: Upright in MBS chair  II. VIEW: Lateral  III. COMPENSATORY STRATEGIES: Not effective- chin tuck and head turn.  Voluntary throat clear/cough effective in expelling penetrated material.    IV. BOLUSES ADMINISTERED:   Thin Liquid: 5 cup rim   Nectar-thick Liquid: 1 moderate   Honey-thick Liquid:  DNT   Puree: 2 teaspoon presentations   Mechanical Soft: 1/4 graham cracker in applesauce  V. RESULTS OF EVALUATION: A. ORAL PREPARATORY PHASE: (The lips, tongue, and velum are observed for strength and coordination)       **Overall Severity Rating: Mild; pre-swallow spill into pharynx  B. SWALLOW INITIATION/REFLEX: (The reflex is normal if "triggered" by the time the bolus reached the base of the  tongue)  **Overall Severity Rating: Moderate; triggers while falling from the valleculae to the pyriform sinuses  C. PHARYNGEAL PHASE: (Pharyngeal function is normal if the bolus shows rapid, smooth, and continuous transit through the pharynx and there is no pharyngeal residue after the swallow)  **Overall Severity Rating: Moderate; decreased hyolaryngeal excursion, incomplete epiglottic inversion, reduced tongue base retraction, moderate vallecular residue post swallow, and variable amount of residue in pyriform sinuses and along posterior pharyngeal wall.    D. LARYNGEAL PENETRATION: (Material entering into the laryngeal inlet/vestibule but not aspirated) There is trace laryngeal penetration from pharyngeal residue with effective cough/throat clear as the penetrated material approaches the vocal cords.    E. ASPIRATION: Not observed during the study- effective cough/throat clear  F. ESOPHAGEAL PHASE: (Screening of the upper esophagus) No observed abnormality within the viewable cervical esophagus  ASSESSMENT: This 84 year old man, with report of occasional choking/coughing while eating, is presenting with moderate oropharyngeal dysphagia characterized by pre-swallow spillage over base of tongue, decreased hyolaryngeal excursion, incomplete epiglottic inversion, reduced tongue base retraction, moderate vallecular residue post swallow, and variable amount of residue in pyriform sinuses and along posterior pharyngeal wall.  There is trace laryngeal penetration from pharyngeal residue with effective cough/throat clear as the penetrated material approaches the vocal cords.  Swallowing maneuvers (chin tuck, head turn) are not effective in reducing risk for laryngeal penetration.  The patient would benefit from referral to speech therapy for exercises to improve hyolaryngeal movement and reduce pharyngeal residue, such as Shaker Exercise, effortful pitch glide, and high effort/high intensity vocal exercises  (such as loud vowel prolongation, loud speech).    PLAN/RECOMMENDATIONS:    A. Diet: continue usual diet   B. Swallowing Precautions: Cough if you feel the need, maintain stringent oral care   C. Recommended consultation to: neurology, current study is consistent with neuromuscular oropharyngeal dysphagia   D. Therapy recommendations: speech therapy for swallowing exercises   E. Results and recommendations were discussed with the patient and a daughter immediatly following the study and the final report routed to the referring MD.   Patient will benefit from skilled therapeutic intervention in order to improve the following deficits and impairments:   Oropharyngeal dysphagia - Plan: DG OP Swallowing Func-Medicare/Speech Path, DG OP Swallowing Func-Medicare/Speech Path        Problem List Patient Active Problem List   Diagnosis Date Noted  . Cigarette smoker 06/16/2016  . Dyspnea on exertion 06/15/2016  . Dementia arising in the senium and presenium 01/07/2014  . CSA (central sleep apnea) 10/05/2013  . Syncope 06/26/2013  . COPD GOLD II 06/25/2013  . BPH (benign prostatic hyperplasia) 05/18/2013  . Glaucoma 05/18/2013  . S/P right TH revision 05/14/2013  . Constipation 03/26/2013  . Osteoporosis 03/26/2013  . Expected blood loss anemia 03/23/2013  . S/P right THA, AA 03/20/2013  . Hyperlipidemia   . Erectile dysfunction   . History of asbestos exposure   . Nocturia   . History of shingles   . Dermatitis, atopic   . OA (osteoarthritis) of hip   . Arthritis   .  Frequency of urination   . Lumbar scoliosis   . Malignant neoplasm of prostate ([place]) 06/07/2011  . 84 year old gentleman with stage T3 adenocarcinoma prostate with Gleason score 4+4 and PSA of 10.3 05/11/2011   Leroy Sea, MS/CCC- SLP  Lou Miner 01/31/2017, 3:29 PM  [facility] [place], [place], [address] Phone:  [phone]   Fax:     Name: RYKAR LEBLEU MRN: [id number] Date of Birth: [birth year]

## 2017-02-03 DIAGNOSIS — R1314 Dysphagia, pharyngoesophageal phase: Secondary | ICD-10-CM | POA: Diagnosis not present

## 2017-02-03 DIAGNOSIS — R42 Dizziness and giddiness: Secondary | ICD-10-CM | POA: Diagnosis not present

## 2017-02-08 DIAGNOSIS — R42 Dizziness and giddiness: Secondary | ICD-10-CM | POA: Diagnosis not present

## 2017-02-10 DIAGNOSIS — R42 Dizziness and giddiness: Secondary | ICD-10-CM | POA: Diagnosis not present

## 2017-02-11 DIAGNOSIS — G4731 Primary central sleep apnea: Secondary | ICD-10-CM | POA: Diagnosis not present

## 2017-02-11 DIAGNOSIS — R351 Nocturia: Secondary | ICD-10-CM | POA: Diagnosis not present

## 2017-02-11 DIAGNOSIS — Z471 Aftercare following joint replacement surgery: Secondary | ICD-10-CM | POA: Diagnosis not present

## 2017-02-11 DIAGNOSIS — R0602 Shortness of breath: Secondary | ICD-10-CM | POA: Diagnosis not present

## 2017-02-15 DIAGNOSIS — R42 Dizziness and giddiness: Secondary | ICD-10-CM | POA: Diagnosis not present

## 2017-02-16 DIAGNOSIS — Z8546 Personal history of malignant neoplasm of prostate: Secondary | ICD-10-CM | POA: Diagnosis not present

## 2017-02-17 DIAGNOSIS — R42 Dizziness and giddiness: Secondary | ICD-10-CM | POA: Diagnosis not present

## 2017-02-22 ENCOUNTER — Ambulatory Visit: Payer: Medicare HMO | Attending: Neurology

## 2017-02-22 DIAGNOSIS — R42 Dizziness and giddiness: Secondary | ICD-10-CM | POA: Diagnosis not present

## 2017-02-23 DIAGNOSIS — Z8546 Personal history of malignant neoplasm of prostate: Secondary | ICD-10-CM | POA: Diagnosis not present

## 2017-02-23 DIAGNOSIS — N3941 Urge incontinence: Secondary | ICD-10-CM | POA: Diagnosis not present

## 2017-02-24 ENCOUNTER — Ambulatory Visit: Payer: Medicare HMO | Admitting: Speech Pathology

## 2017-02-24 DIAGNOSIS — R42 Dizziness and giddiness: Secondary | ICD-10-CM | POA: Diagnosis not present

## 2017-02-28 ENCOUNTER — Encounter: Payer: Medicare HMO | Admitting: Speech Pathology

## 2017-03-01 ENCOUNTER — Emergency Department (HOSPITAL_COMMUNITY): Payer: Medicare HMO

## 2017-03-01 ENCOUNTER — Observation Stay (HOSPITAL_COMMUNITY)
Admission: EM | Admit: 2017-03-01 | Discharge: 2017-03-03 | Disposition: A | Discharge status: Home / Self Care | Payer: Medicare HMO | Attending: Family Medicine | Admitting: Family Medicine

## 2017-03-01 ENCOUNTER — Encounter (HOSPITAL_COMMUNITY): Payer: Self-pay | Admitting: Emergency Medicine

## 2017-03-01 ENCOUNTER — Other Ambulatory Visit: Payer: Self-pay

## 2017-03-01 DIAGNOSIS — J449 Chronic obstructive pulmonary disease, unspecified: Secondary | ICD-10-CM | POA: Diagnosis not present

## 2017-03-01 DIAGNOSIS — F0390 Unspecified dementia without behavioral disturbance: Secondary | ICD-10-CM | POA: Diagnosis not present

## 2017-03-01 DIAGNOSIS — E785 Hyperlipidemia, unspecified: Secondary | ICD-10-CM | POA: Diagnosis not present

## 2017-03-01 DIAGNOSIS — E7800 Pure hypercholesterolemia, unspecified: Secondary | ICD-10-CM

## 2017-03-01 DIAGNOSIS — G459 Transient cerebral ischemic attack, unspecified: Secondary | ICD-10-CM | POA: Diagnosis not present

## 2017-03-01 DIAGNOSIS — Z7982 Long term (current) use of aspirin: Secondary | ICD-10-CM | POA: Diagnosis not present

## 2017-03-01 DIAGNOSIS — I10 Essential (primary) hypertension: Secondary | ICD-10-CM

## 2017-03-01 DIAGNOSIS — R29810 Facial weakness: Secondary | ICD-10-CM | POA: Diagnosis not present

## 2017-03-01 DIAGNOSIS — R4781 Slurred speech: Secondary | ICD-10-CM | POA: Diagnosis not present

## 2017-03-01 DIAGNOSIS — F17210 Nicotine dependence, cigarettes, uncomplicated: Secondary | ICD-10-CM | POA: Diagnosis not present

## 2017-03-01 DIAGNOSIS — Z7902 Long term (current) use of antithrombotics/antiplatelets: Secondary | ICD-10-CM | POA: Diagnosis not present

## 2017-03-01 DIAGNOSIS — R531 Weakness: Secondary | ICD-10-CM

## 2017-03-01 DIAGNOSIS — Z79899 Other long term (current) drug therapy: Secondary | ICD-10-CM | POA: Diagnosis not present

## 2017-03-01 DIAGNOSIS — H2511 Age-related nuclear cataract, right eye: Secondary | ICD-10-CM | POA: Diagnosis not present

## 2017-03-01 DIAGNOSIS — H25811 Combined forms of age-related cataract, right eye: Secondary | ICD-10-CM | POA: Diagnosis not present

## 2017-03-01 DIAGNOSIS — R69 Illness, unspecified: Secondary | ICD-10-CM | POA: Diagnosis not present

## 2017-03-01 DIAGNOSIS — Z96641 Presence of right artificial hip joint: Secondary | ICD-10-CM | POA: Diagnosis not present

## 2017-03-01 LAB — COMPREHENSIVE METABOLIC PANEL
ALK PHOS: 78 U/L (ref 38–126)
ALT: 52 U/L (ref 17–63)
ANION GAP: 8 (ref 5–15)
AST: 29 U/L (ref 15–41)
Albumin: 3 g/dL — ABNORMAL LOW (ref 3.5–5.0)
BUN: 19 mg/dL (ref 6–20)
CALCIUM: 9.1 mg/dL (ref 8.9–10.3)
CO2: 21 mmol/L — ABNORMAL LOW (ref 22–32)
CREATININE: 1.42 mg/dL — AB (ref 0.61–1.24)
Chloride: 111 mmol/L (ref 101–111)
GFR, EST AFRICAN AMERICAN: 51 mL/min — AB (ref >60)
GFR, EST NON AFRICAN AMERICAN: 44 mL/min — AB (ref >60)
Glucose, Bld: 102 mg/dL — ABNORMAL HIGH (ref 65–99)
Potassium: 4.2 mmol/L (ref 3.5–5.1)
SODIUM: 140 mmol/L (ref 135–145)
TOTAL PROTEIN: 7 g/dL (ref 6.5–8.1)
Total Bilirubin: 0.4 mg/dL (ref 0.3–1.2)

## 2017-03-01 LAB — I-STAT CHEM 8, ED
BUN: 23 mg/dL — ABNORMAL HIGH (ref 6–20)
CALCIUM ION: 1.2 mmol/L (ref 1.15–1.40)
Chloride: 108 mmol/L (ref 101–111)
Creatinine, Ser: 1.5 mg/dL — ABNORMAL HIGH (ref 0.61–1.24)
GLUCOSE: 99 mg/dL (ref 65–99)
HCT: 35 % — ABNORMAL LOW (ref 39.0–52.0)
HEMOGLOBIN: 11.9 g/dL — AB (ref 13.0–17.0)
Potassium: 4.2 mmol/L (ref 3.5–5.1)
SODIUM: 143 mmol/L (ref 135–145)
TCO2: 26 mmol/L (ref 22–32)

## 2017-03-01 LAB — PROTIME-INR
INR: 1.04
PROTHROMBIN TIME: 13.5 s (ref 11.4–15.2)

## 2017-03-01 LAB — I-STAT TROPONIN, ED: TROPONIN I, POC: 0.01 ng/mL (ref 0.00–0.08)

## 2017-03-01 LAB — DIFFERENTIAL
Basophils Absolute: 0 10*3/uL (ref 0.0–0.1)
Basophils Relative: 0 %
EOS PCT: 5 %
Eosinophils Absolute: 0.2 10*3/uL (ref 0.0–0.7)
LYMPHS PCT: 28 %
Lymphs Abs: 0.8 10*3/uL (ref 0.7–4.0)
MONO ABS: 0.3 10*3/uL (ref 0.1–1.0)
MONOS PCT: 10 %
Neutro Abs: 1.6 10*3/uL — ABNORMAL LOW (ref 1.7–7.7)
Neutrophils Relative %: 57 %

## 2017-03-01 LAB — CBC
HEMATOCRIT: 34.4 % — AB (ref 39.0–52.0)
Hemoglobin: 11.2 g/dL — ABNORMAL LOW (ref 13.0–17.0)
MCH: 28.6 pg (ref 26.0–34.0)
MCHC: 32.6 g/dL (ref 30.0–36.0)
MCV: 88 fL (ref 78.0–100.0)
PLATELETS: 178 10*3/uL (ref 150–400)
RBC: 3.91 MIL/uL — AB (ref 4.22–5.81)
RDW: 14.8 % (ref 11.5–15.5)
WBC: 2.8 10*3/uL — ABNORMAL LOW (ref 4.0–10.5)

## 2017-03-01 LAB — CBG MONITORING, ED: GLUCOSE-CAPILLARY: 96 mg/dL (ref 65–99)

## 2017-03-01 LAB — APTT: aPTT: 28 seconds (ref 24–36)

## 2017-03-01 MED ORDER — INSULIN ASPART 100 UNIT/ML ~~LOC~~ SOLN: 0.0000 [IU] | Freq: Three times a day (TID) | SUBCUTANEOUS | Status: DC

## 2017-03-01 MED ORDER — INSULIN ASPART 100 UNIT/ML ~~LOC~~ SOLN: 0.0000 [IU] | Freq: Every day | SUBCUTANEOUS | Status: DC

## 2017-03-01 MED ORDER — IBUPROFEN 200 MG PO TABS
400.0000 mg | ORAL_TABLET | Freq: Every day | ORAL | Status: DC
  Administered 2017-03-03: 400 mg via ORAL
  Filled 2017-03-01: qty 2

## 2017-03-01 MED ORDER — NEPAFENAC 0.3 % OP SUSP
1.0000 [drp] | Freq: Every day | OPHTHALMIC | Status: DC
  Administered 2017-03-02 – 2017-03-03 (×2): 1 [drp] via OPHTHALMIC
  Filled 2017-03-01: qty 3

## 2017-03-01 MED ORDER — SODIUM CHLORIDE 0.9 % IV SOLN
INTRAVENOUS | Status: DC
  Administered 2017-03-02: 01:00:00 via INTRAVENOUS

## 2017-03-01 MED ORDER — PRAVASTATIN SODIUM 40 MG PO TABS: 80.0000 mg | ORAL_TABLET | Freq: Every evening | ORAL | Status: DC

## 2017-03-01 MED ORDER — ACETAMINOPHEN 325 MG PO TABS: 650.0000 mg | ORAL_TABLET | ORAL | Status: DC | PRN

## 2017-03-01 MED ORDER — ACETAMINOPHEN 500 MG PO TABS: 1000.0000 mg | ORAL_TABLET | Freq: Two times a day (BID) | ORAL | Status: DC | PRN

## 2017-03-01 MED ORDER — ACETAMINOPHEN 160 MG/5ML PO SOLN: 650.0000 mg | ORAL | Status: DC | PRN

## 2017-03-01 MED ORDER — ASPIRIN EC 81 MG PO TBEC
81.0000 mg | DELAYED_RELEASE_TABLET | Freq: Every day | ORAL | Status: DC
  Administered 2017-03-03: 81 mg via ORAL
  Filled 2017-03-01: qty 1

## 2017-03-01 MED ORDER — STROKE: EARLY STAGES OF RECOVERY BOOK
Freq: Once | Status: AC
  Administered 2017-03-02 – 2017-03-03 (×2)
  Filled 2017-03-01: qty 1

## 2017-03-01 MED ORDER — DIFLUPREDNATE 0.05 % OP EMUL
1.0000 [drp] | OPHTHALMIC | Status: DC
  Administered 2017-03-02 (×8): 1 [drp] via OPHTHALMIC
  Filled 2017-03-01: qty 5

## 2017-03-01 MED ORDER — ACETAMINOPHEN 650 MG RE SUPP
650.0000 mg | RECTAL | Status: DC | PRN
Start: 2017-03-01 — End: 2017-03-03

## 2017-03-01 MED ORDER — FOLIC ACID 1 MG PO TABS
1.0000 mg | ORAL_TABLET | Freq: Every day | ORAL | Status: DC
  Administered 2017-03-03: 1 mg via ORAL
  Filled 2017-03-01: qty 1

## 2017-03-01 MED ORDER — MIRABEGRON ER 25 MG PO TB24
50.0000 mg | ORAL_TABLET | Freq: Every day | ORAL | Status: DC
  Administered 2017-03-03: 50 mg via ORAL
  Filled 2017-03-01: qty 1
  Filled 2017-03-01: qty 2

## 2017-03-01 MED ORDER — UBIQUINOL 100 MG PO CAPS: 100.0000 mg | ORAL_CAPSULE | Freq: Every day | ORAL | Status: DC

## 2017-03-01 MED ORDER — CALCIUM CARBONATE ANTACID 500 MG PO CHEW: 2.0000 | CHEWABLE_TABLET | Freq: Every day | ORAL | Status: DC

## 2017-03-01 MED ORDER — CLOPIDOGREL BISULFATE 75 MG PO TABS
75.0000 mg | ORAL_TABLET | Freq: Every day | ORAL | Status: DC
  Administered 2017-03-03: 75 mg via ORAL
  Filled 2017-03-01: qty 1

## 2017-03-01 MED ORDER — EZETIMIBE 10 MG PO TABS
10.0000 mg | ORAL_TABLET | Freq: Every morning | ORAL | Status: DC
  Administered 2017-03-03: 10 mg via ORAL
  Filled 2017-03-01: qty 1

## 2017-03-01 MED ORDER — SENNOSIDES-DOCUSATE SODIUM 8.6-50 MG PO TABS: 1.0000 | ORAL_TABLET | Freq: Every evening | ORAL | Status: DC | PRN

## 2017-03-01 MED ORDER — OFLOXACIN 0.3 % OP SOLN
1.0000 [drp] | OPHTHALMIC | Status: DC
  Administered 2017-03-02 (×6): 1 [drp] via OPHTHALMIC
  Filled 2017-03-01: qty 5

## 2017-03-01 NOTE — ED Provider Notes (Addendum)
[facility] EMERGENCY DEPARTMENT Provider Note   CSN: [id number] Arrival date & time: 03/01/17  1858     History   Chief Complaint Chief Complaint  Patient presents with  . Transient Ischemic Attack    HPI Derek Carr is a 84 y.o. male.  HPI   Patient is an 84 year old male presenting with TIA symptoms.  Patient was woken by his daughter at 21 PM and she noticed a right facial droop.  She also noticed that his right arm was completely weak.  This is been since resolving since EMS arrival.  It is not completely resolved.  Patient has past medical history significant for hypertension hyperlipidemia prostate cancer.  Patient has no complaints at all and did not notice his symptoms.  Patient of note had cataract surgery this morning.   Past Medical History:  Diagnosis Date  . Anemia   . Atherosclerosis   . Complication of anesthesia    CONFUSION - Pt's family very concerned about this  . DDD (degenerative disc disease)   . Degenerative arthritis   . Dermatitis, atopic ARMS AND LEGS  . Diverticulosis   . Erectile dysfunction   . Frequency of urination   . Glaucoma   . History of asbestos exposure   . History of radiation therapy 8/19-13-10/18/11   prostate, 85 GY  . History of shingles 2012-- BILATERAL EYES--  NO RESIDUAL  . Hyperlipidemia   . Hypertension   . Inguinal hernia   . Lumbar scoliosis   . Nocturia   . OA (osteoarthritis) of hip RIGHT  . Prostate cancer ([place]) 05/11/2011   bx=Adenocarcinoma,gleason3+4=7, 4+4=8,PSA=10.30volume=45.7cc  . Renal cyst    bilateral  . Smokers' cough ([place])   . Thyroid cyst     Patient Active Problem List   Diagnosis Date Noted  . Cigarette smoker 06/16/2016  . Dyspnea on exertion 06/15/2016  . Dementia arising in the senium and presenium 01/07/2014  . CSA (central sleep apnea) 10/05/2013  . Syncope 06/26/2013  . COPD GOLD II 06/25/2013  . BPH (benign prostatic hyperplasia) 05/18/2013  . Glaucoma  05/18/2013  . S/P right TH revision 05/14/2013  . Constipation 03/26/2013  . Osteoporosis 03/26/2013  . Expected blood loss anemia 03/23/2013  . S/P right THA, AA 03/20/2013  . Hyperlipidemia   . Erectile dysfunction   . History of asbestos exposure   . Nocturia   . History of shingles   . Dermatitis, atopic   . OA (osteoarthritis) of hip   . Arthritis   . Frequency of urination   . Lumbar scoliosis   . Malignant neoplasm of prostate ([place]) 06/07/2011  . 84 year old gentleman with stage T3 adenocarcinoma prostate with Gleason score 4+4 and PSA of 10.3 05/11/2011    Past Surgical History:  Procedure Laterality Date  . ATTEMPTED LEFT VATS/ LEFT THORACOTOMY/ RESECTION OF THE ENORMOUS, PROBABLE BRONCHOGENIC CYST  01-04-2005  DR Arlyce Dice  . CARDIAC CATHETERIZATION  02-24-2009  DR NASHER   MINOR CORONARY ARTERY IRREGULARITIES/ NORMAL LVSF/ EF 65-70%  . CATARACT EXTRACTION    . COLONOSCOPY W/ POLYPECTOMY    . CYSTOSCOPY  11/15/2011   Procedure: CYSTOSCOPY;  Surgeon: Dutch Gray, MD;  Location: [facility];  Service: Urology;  Laterality: N/A;  no seeds seen in bladder  . esophageus cyst removal  YRS AGO  . [place]  . PROSTATE BIOPSY  05/11/11   Adenocarcinoma ([facility] OFFICE)  . RADIOACTIVE SEED IMPLANT  11/15/2011   Procedure: RADIOACTIVE SEED  IMPLANT;  Surgeon: Dutch Gray, MD;  Location[facility];  Service: Urology;  Laterality: N/A;  Total number of seeds - 52  . REPAIR RIGHT INGUINAL HERNIA W/ MESH  09-10-1999  . TOTAL HIP ARTHROPLASTY Right 03/20/2013   Procedure: RIGHT TOTAL HIP ARTHROPLASTY ANTERIOR APPROACH;  Surgeon: [name], MD;  Location: [facility];  Service: Orthopedics;  Laterality: Right;  . TOTAL HIP REVISION Right 05/14/2013   Procedure: open reduction internal fixation REVISION RIGHT HIP ;  Surgeon: [name], MD;  Location: [facility];  Service: Orthopedics;  Laterality: Right;  . UMBILICAL HERNIA REPAIR  1998    epigastric       Home Medications    Prior to Admission medications   Medication Sig Start Date End Date Taking? Authorizing Provider  acetaminophen (TYLENOL) 500 MG tablet Take 1,000 mg by mouth 2 (two) times daily as needed (for pain).    Yes [provider]  aspirin 81 MG tablet Take 81 mg by mouth daily.   Yes [provider]  BRIMONIDINE TARTRATE OP Place 1 drop into the right eye 3 (three) times daily.   Yes [provider]  Calcium Carbonate Antacid 1177 MG CHEW Chew 1 tablet by mouth daily.    Yes [provider]  Cholecalciferol (VITAMIN D-3 PO) Take 1 capsule by mouth daily.    Yes [provider]  clopidogrel (PLAVIX) 75 MG tablet Take 75 mg by mouth daily.   Yes [provider]  Cyanocobalamin (VITAMIN B12 PO) Take 1 tablet by mouth daily.    Yes [provider]  Difluprednate (DUREZOL) 0.05 % EMUL Place 1 drop into the right eye every 2 (two) hours.   Yes [provider]  Docosahexaenoic Acid (DHA OMEGA 3 PO) Take 1,000 mg by mouth daily.    Yes [provider]  ezetimibe (ZETIA) 10 MG tablet Take 10 mg by mouth every morning.    Yes [provider]  folic acid (FOLVITE) 1 MG tablet Take 1 mg by mouth daily.   Yes [provider]  ibuprofen (ADVIL,MOTRIN) 200 MG tablet Take 400 mg by mouth every morning.   Yes [provider]  irbesartan (AVAPRO) 75 MG tablet Take 37.5 mg by mouth daily. **REPLACES VALSARTAN** 11/28/16  Yes [provider]  mirabegron ER (MYRBETRIQ) 50 MG TB24 tablet Take 50 mg by mouth daily.  06/28/13  Yes [provider]  nepafenac (ILEVRO) 0.3 % ophthalmic suspension Place 1 drop into the right eye daily.   Yes [provider]  ofloxacin (OCUFLOX) 0.3 % ophthalmic solution Place 1 drop into both eyes every 2 (two) hours. Start 72 hours prior to surgery 12/25/16  Yes [provider]  pravastatin (PRAVACHOL) 80 MG tablet  Take 80 mg by mouth every evening.  08/17/10  Yes [provider]  Ubiquinol 100 MG CAPS Take 100 mg by mouth daily.   Yes [provider]  valACYclovir (VALTREX) 1000 MG tablet Take 1,000 mg by mouth 3 (three) times daily.  02/03/16  Yes [provider]  clobetasol cream (TEMOVATE) 0.96 % Apply 1 application topically 2 (two) times daily. DO NOT USE ON THE FACE Patient not taking: Reported on 12/23/2016 08/26/13   Charlann Lange, PA-C    Family History Family History  Problem Relation Age of Onset  . Hypertension Mother   . Prostate cancer Brother        surgery/cured  . Lung cancer Brother  new primary/same brother    Social History Social History   Tobacco Use  . Smoking status: Current Some Day Smoker    Packs/day: 0.50    Years: 62.00    Pack years: 31.00    Types: Cigarettes  . Smokeless tobacco: Never Used  Substance Use Topics  . Alcohol use: No    Alcohol/week: 0.6 oz    Types: 1 Cans of beer per week  . Drug use: No    Comment: quit smoking 02/2012     Allergies   Dilaudid [hydromorphone hcl]; Methocarbamol; Percodan [oxycodone-aspirin]; Tramadol; and Vicodin [hydrocodone-acetaminophen]   Review of Systems Review of Systems  Constitutional: Negative for activity change.  Respiratory: Negative for shortness of breath.   Cardiovascular: Negative for chest pain.  Gastrointestinal: Negative for abdominal pain.  Neurological: Positive for speech difficulty and weakness.     Physical Exam Updated Vital Signs BP 140/89   Pulse 61   Temp 98.4 F (36.9 C) (Oral)   Resp 15   SpO2 98%   Physical Exam  Constitutional: He is oriented to person, place, and time. He appears well-nourished.  HENT:  Head: Normocephalic.  Eyes: Conjunctivae are normal. Right eye exhibits no discharge. Left eye exhibits no discharge.  Cardiovascular: Normal rate and regular rhythm.  No murmur heard. Pulmonary/Chest: Effort normal and breath sounds  normal. No respiratory distress.  Abdominal: Soft. He exhibits no distension. There is no tenderness.  Neurological: He is oriented to person, place, and time. No cranial nerve deficit.  Equal strength bilaterally upper and lower extremities negative pronator drift. Normal sensation bilaterally. Speech comprehensible, no slurring. Facial nerve tested and appears grossly normal. Alert and oriented 3.   Skin: Skin is warm and dry. He is not diaphoretic.  Psychiatric: He has a normal mood and affect. His behavior is normal.     ED Treatments / Results  Labs (all labs ordered are listed, but only abnormal results are displayed) Labs Reviewed  CBC - Abnormal; Notable for the following components:      Result Value   WBC 2.8 (*)    RBC 3.91 (*)    Hemoglobin 11.2 (*)    HCT 34.4 (*)    All other components within normal limits  DIFFERENTIAL - Abnormal; Notable for the following components:   Neutro Abs 1.6 (*)    All other components within normal limits  COMPREHENSIVE METABOLIC PANEL - Abnormal; Notable for the following components:   CO2 21 (*)    Glucose, Bld 102 (*)    Creatinine, Ser 1.42 (*)    Albumin 3.0 (*)    GFR calc non Af Amer 44 (*)    GFR calc Af Amer 51 (*)    All other components within normal limits  I-STAT CHEM 8, ED - Abnormal; Notable for the following components:   BUN 23 (*)    Creatinine, Ser 1.50 (*)    Hemoglobin 11.9 (*)    HCT 35.0 (*)    All other components within normal limits  PROTIME-INR  APTT  CBG MONITORING, ED  I-STAT TROPONIN, ED  CBG MONITORING, ED    EKG  EKG Interpretation None       Radiology Ct Head Wo Contrast  Result Date: 03/01/2017 CLINICAL DATA:  Pt with right sided facial droop, RUE weakness, resolved. H/O TIA EXAM: CT HEAD WITHOUT CONTRAST TECHNIQUE: Contiguous axial images were obtained from the base of the skull through the vertex without intravenous contrast. COMPARISON:  12/23/2016 FINDINGS: Brain: No  evidence of  acute infarction, hemorrhage, hydrocephalus, extra-axial collection or mass lesion/mass effect. Mild white matter hypoattenuation noted consistent with chronic microvascular ischemic change. Vascular: No hyperdense vessel or unexpected calcification. Skull: Normal. Negative for fracture or focal lesion. Sinuses/Orbits: Globes and orbits are unremarkable. Visualized sinuses are essentially clear. A few of the inferior right mastoid air cells show fluid attenuation. Remaining mastoid air cells are clear. Other: None. IMPRESSION: 1. No acute intracranial abnormalities. 2. Mild chronic microvascular ischemic change. Electronically Signed   By: Lajean Manes M.D.   On: 03/01/2017 19:52    Procedures Procedures (including critical care time)  Medications Ordered in ED Medications - No data to display   Initial Impression / Assessment and Plan / ED Course  I have reviewed the triage vital signs and the nursing notes.  Pertinent labs & imaging results that were available during my care of the patient were reviewed by me and considered in my medical decision making (see chart for details).     Patient is an 84 year old male presenting with TIA symptoms.  Patient was woken by his daughter at 11 PM and she noticed a right facial droop.  She also noticed that his right arm was completely weak.  This is been since resolving since EMS arrival.  It is not completely resolved.  Patient has past medical history significant for hypertension hyperlipidemia prostate cancer.  Patient has no complaints at all and did not notice his symptoms.  Of note had cataract surgery this morning.   7:31 PM Will admit patient for TIA workup.  9:48 PM Had much longer conversation with family.  Apparently patient has had a stroke partially worked up in December.  Patient's been having difficulty swallowing.  He had negative MRI at that time.  Barium swallow showed weakness on one side of his neck.  He was being referred to Dr.  Leonie Man for further stroke workup.  Has not had carotids or echo recently.    Final Clinical Impressions(s) / ED Diagnoses   Final diagnoses:  None    ED Discharge Orders    None       Macarthur Critchley, MD 03/01/17 1931    Macarthur Critchley, MD 03/01/17 2152

## 2017-03-01 NOTE — H&P (Signed)
History and Physical    Derek Carr CVE:[id number] DOB: [birth year] DOA: 03/01/2017  PCP: [name], MD Patient coming from: Home  Chief Complaint: Right-sided weakness  HPI: Derek Carr is a 84 y.o. male with medical history significant of dementia, prostate cancer in remission, hyperlipidemia, hypertension was brought to the ER for evaluation of right-sided weakness and facial droop.  Patient states that he was woken up by his daughter around 6 PM in the evening he was noted to have right upper extremity weakness, slurred speech and right-sided facial droop.  Although patient did not feel any of the symptoms daughter clearly noted this and due to this ended up calling EMS.  By the time EMS arrived patient had resolution of his slurred speech and right-sided facial droop but continued to have right upper extremity drift and this resolved by the time he came to the ER. Per the daughter patient had a similar TIA type of attack back in December 2018 and was started on Plavix along with his aspirin.  At that time he was asked to follow-up with neurologist Dr. Leonie Man outpatient and has a follow up appointment coming up.  In the meantime due to concerns of outpatient dysphagia he was seen by an ENT he ordered barium swallow and it was confirmed that patient does have some weakness with risk of aspiration.  In the ER today patient was asymptomatic and all of his symptoms had resolved within an hour.  His labs and his vital signs were overall unremarkable.  Patient was admitted for observation   Review of Systems: As per HPI otherwise 10 point review of systems negative.   Past Medical History:  Diagnosis Date  . Anemia   . Atherosclerosis   . Complication of anesthesia    CONFUSION - Pt's family very concerned about this  . DDD (degenerative disc disease)   . Degenerative arthritis   . Dermatitis, atopic ARMS AND LEGS  . Diverticulosis   . Erectile dysfunction   . Frequency of urination    . Glaucoma   . History of asbestos exposure   . History of radiation therapy 8/19-13-10/18/11   prostate, 77 GY  . History of shingles 2012-- BILATERAL EYES--  NO RESIDUAL  . Hyperlipidemia   . Hypertension   . Inguinal hernia   . Lumbar scoliosis   . Nocturia   . OA (osteoarthritis) of hip RIGHT  . Prostate cancer ([place]) 05/11/2011   bx=Adenocarcinoma,gleason3+4=7, 4+4=8,PSA=10.30volume=45.7cc  . Renal cyst    bilateral  . Smokers' cough ([place])   . Thyroid cyst     Past Surgical History:  Procedure Laterality Date  . ATTEMPTED LEFT VATS/ LEFT THORACOTOMY/ RESECTION OF THE ENORMOUS, PROBABLE BRONCHOGENIC CYST  01-04-2005  DR Arlyce Dice  . CARDIAC CATHETERIZATION  02-24-2009  DR NASHER   MINOR CORONARY ARTERY IRREGULARITIES/ NORMAL LVSF/ EF 65-70%  . CATARACT EXTRACTION    . COLONOSCOPY W/ POLYPECTOMY    . CYSTOSCOPY  11/15/2011   Procedure: CYSTOSCOPY;  Surgeon: Dutch Gray, MD;  Location: [facility];  Service: Urology;  Laterality: N/A;  no seeds seen in bladder  . esophageus cyst removal  YRS AGO  . [place]  . PROSTATE BIOPSY  05/11/11   Adenocarcinoma ([facility])  . RADIOACTIVE SEED IMPLANT  11/15/2011   Procedure: RADIOACTIVE SEED IMPLANT;  Surgeon: Dutch Gray, MD;  Location: [facility];  Service: Urology;  Laterality: N/A;  Total number of seeds - 52  . REPAIR  RIGHT INGUINAL HERNIA W/ MESH  09-10-1999  . TOTAL HIP ARTHROPLASTY Right 03/20/2013   Procedure: RIGHT TOTAL HIP ARTHROPLASTY ANTERIOR APPROACH;  Surgeon: [name], MD;  Location: [facility];  Service: Orthopedics;  Laterality: Right;  . TOTAL HIP REVISION Right 05/14/2013   Procedure: open reduction internal fixation REVISION RIGHT HIP ;  Surgeon: [name], MD;  Location: [facility];  Service: Orthopedics;  Laterality: Right;  . UMBILICAL HERNIA REPAIR  1998   epigastric     reports that he has been smoking cigarettes.  He has a 31.00 pack-year smoking history. he  has never used smokeless tobacco. He reports that he does not drink alcohol or use drugs.  Allergies  Allergen Reactions  . Dilaudid [Hydromorphone Hcl] Other (See Comments)    Confusion   . Methocarbamol Other (See Comments)    Drowsiness   . Percodan [Oxycodone-Aspirin] Other (See Comments)    Confusion   . Tramadol Other (See Comments)    Drowsiness   . Vicodin [Hydrocodone-Acetaminophen] Other (See Comments)    Confusion     Family History  Problem Relation Age of Onset  . Hypertension Mother   . Prostate cancer Brother        surgery/cured  . Lung cancer Brother        new primary/same brother     Prior to Admission medications   Medication Sig Start Date End Date Taking? Authorizing Provider  acetaminophen (TYLENOL) 500 MG tablet Take 1,000 mg by mouth 2 (two) times daily as needed (for pain).    Yes [provider]  aspirin 81 MG tablet Take 81 mg by mouth daily.   Yes [provider]  BRIMONIDINE TARTRATE OP Place 1 drop into the right eye 3 (three) times daily.   Yes [provider]  Calcium Carbonate Antacid 1177 MG CHEW Chew 1 tablet by mouth daily.    Yes [provider]  Cholecalciferol (VITAMIN D-3 PO) Take 1 capsule by mouth daily.    Yes [provider]  clopidogrel (PLAVIX) 75 MG tablet Take 75 mg by mouth daily.   Yes [provider]  Cyanocobalamin (VITAMIN B12 PO) Take 1 tablet by mouth daily.    Yes [provider]  Difluprednate (DUREZOL) 0.05 % EMUL Place 1 drop into the right eye every 2 (two) hours.   Yes [provider]  Docosahexaenoic Acid (DHA OMEGA 3 PO) Take 1,000 mg by mouth daily.    Yes [provider]  ezetimibe (ZETIA) 10 MG tablet Take 10 mg by mouth every morning.    Yes [provider]  folic acid (FOLVITE) 1 MG tablet Take 1 mg by mouth daily.   Yes [provider]  ibuprofen (ADVIL,MOTRIN) 200 MG tablet Take 400 mg by mouth every  morning.   Yes [provider]  irbesartan (AVAPRO) 75 MG tablet Take 37.5 mg by mouth daily. **REPLACES VALSARTAN** 11/28/16  Yes [provider]  mirabegron ER (MYRBETRIQ) 50 MG TB24 tablet Take 50 mg by mouth daily.  06/28/13  Yes [provider]  nepafenac (ILEVRO) 0.3 % ophthalmic suspension Place 1 drop into the right eye daily.   Yes [provider]  ofloxacin (OCUFLOX) 0.3 % ophthalmic solution Place 1 drop into both eyes every 2 (two) hours. Start 72 hours prior to surgery 12/25/16  Yes [provider]  pravastatin (PRAVACHOL) 80 MG tablet Take 80 mg by mouth every evening.  08/17/10  Yes [provider]  Ubiquinol  100 MG CAPS Take 100 mg by mouth daily.   Yes [provider]  valACYclovir (VALTREX) 1000 MG tablet Take 1,000 mg by mouth 3 (three) times daily.  02/03/16  Yes [provider]  clobetasol cream (TEMOVATE) 1.61 % Apply 1 application topically 2 (two) times daily. DO NOT USE ON THE FACE Patient not taking: Reported on 12/23/2016 08/26/13   Charlann Lange, PA-C    Physical Exam: Vitals:   03/01/17 1911 03/01/17 2000 03/01/17 2030 03/01/17 2130  BP:  (!) 135/95 (!) 142/85 (!) 143/89  Pulse:  60 61 61  Resp:  17 (!) 21 18  Temp: 98.4 F (36.9 C)     TempSrc: Oral     SpO2:  99% 98% 95%      Constitutional: NAD, calm, comfortable Vitals:   03/01/17 1911 03/01/17 2000 03/01/17 2030 03/01/17 2130  BP:  (!) 135/95 (!) 142/85 (!) 143/89  Pulse:  60 61 61  Resp:  17 (!) 21 18  Temp: 98.4 F (36.9 C)     TempSrc: Oral     SpO2:  99% 98% 95%   Eyes: PERRL, lids and conjunctivae normal ENMT: Mucous membranes are moist. Posterior pharynx clear of any exudate or lesions.Normal dentition.  Neck: normal, supple, no masses, no thyromegaly Respiratory: clear to auscultation bilaterally, no wheezing, no crackles. Normal respiratory effort. No accessory muscle use.  Cardiovascular: Regular rate and rhythm, no  murmurs / rubs / gallops. No extremity edema. 2+ pedal pulses. No carotid bruits.  Abdomen: no tenderness, no masses palpated. No hepatosplenomegaly. Bowel sounds positive.  Musculoskeletal: no clubbing / cyanosis. No joint deformity upper and lower extremities. Good ROM, no contractures. Normal muscle tone.  Skin: no rashes, lesions, ulcers. No induration Neurologic: CN 2-12 grossly intact. Sensation intact, DTR normal. Strength 5/5 in all 4.  Psychiatric: Normal judgment and insight. Alert and oriented x 3. Normal mood.     Labs on Admission: I have personally reviewed following labs and imaging studies  CBC: Recent Labs  Lab 03/01/17 1909 03/01/17 1920  WBC 2.8*  --   NEUTROABS 1.6*  --   HGB 11.2* 11.9*  HCT 34.4* 35.0*  MCV 88.0  --   PLT 178  --    Basic Metabolic Panel: Recent Labs  Lab 03/01/17 1909 03/01/17 1920  NA 140 143  K 4.2 4.2  CL 111 108  CO2 21*  --   GLUCOSE 102* 99  BUN 19 23*  CREATININE 1.42* 1.50*  CALCIUM 9.1  --    GFR: CrCl cannot be calculated (Unknown ideal weight.). Liver Function Tests: Recent Labs  Lab 03/01/17 1909  AST 29  ALT 52  ALKPHOS 78  BILITOT 0.4  PROT 7.0  ALBUMIN 3.0*   No results for input(s): LIPASE, AMYLASE in the last 168 hours. No results for input(s): AMMONIA in the last 168 hours. Coagulation Profile: Recent Labs  Lab 03/01/17 1909  INR 1.04   Cardiac Enzymes: No results for input(s): CKTOTAL, CKMB, CKMBINDEX, TROPONINI in the last 168 hours. BNP (last 3 results) No results for input(s): PROBNP in the last 8760 hours. HbA1C: No results for input(s): HGBA1C in the last 72 hours. CBG: Recent Labs  Lab 03/01/17 1909  GLUCAP 96   Lipid Profile: No results for input(s): CHOL, HDL, LDLCALC, TRIG, CHOLHDL, LDLDIRECT in the last 72 hours. Thyroid Function Tests: No results for input(s): TSH, T4TOTAL, FREET4, T3FREE, THYROIDAB in the last 72 hours. Anemia Panel: No results for input(s): VITAMINB12,  FOLATE, FERRITIN, TIBC, IRON, RETICCTPCT in the last 72 hours. Urine analysis:    Component Value Date/Time   COLORURINE YELLOW 02/11/2016 [place] 02/11/2016 1830   LABSPEC 1.018 02/11/2016 1830   PHURINE 5.0 02/11/2016 1830   GLUCOSEU NEGATIVE 02/11/2016 1830   HGBUR NEGATIVE 02/11/2016 1830   BILIRUBINUR NEGATIVE 02/11/2016 1830   KETONESUR NEGATIVE 02/11/2016 1830   PROTEINUR NEGATIVE 02/11/2016 1830   UROBILINOGEN 0.2 05/14/2013 1400   NITRITE NEGATIVE 02/11/2016 1830   LEUKOCYTESUR NEGATIVE 02/11/2016 1830   Sepsis Labs: !!!!!!!!!!!!!!!!!!!!!!!!!!!!!!!!!!!!!!!!!!!! @LABRCNTIP (procalcitonin:4,lacticidven:4) )No results found for this or any previous visit (from the past 240 hour(s)).   Radiological Exams on Admission: Ct Head Wo Contrast  Result Date: 03/01/2017 CLINICAL DATA:  Pt with right sided facial droop, RUE weakness, resolved. H/O TIA EXAM: CT HEAD WITHOUT CONTRAST TECHNIQUE: Contiguous axial images were obtained from the base of the skull through the vertex without intravenous contrast. COMPARISON:  12/23/2016 FINDINGS: Brain: No evidence of acute infarction, hemorrhage, hydrocephalus, extra-axial collection or mass lesion/mass effect. Mild white matter hypoattenuation noted consistent with chronic microvascular ischemic change. Vascular: No hyperdense vessel or unexpected calcification. Skull: Normal. Negative for fracture or focal lesion. Sinuses/Orbits: Globes and orbits are unremarkable. Visualized sinuses are essentially clear. A few of the inferior right mastoid air cells show fluid attenuation. Remaining mastoid air cells are clear. Other: None. IMPRESSION: 1. No acute intracranial abnormalities. 2. Mild chronic microvascular ischemic change. Electronically Signed   By: Lajean Manes M.D.   On: 03/01/2017 19:52    EKG: Independently reviewed.  No acute ST-T changes  Assessment/Plan Active Problems:   Right sided weakness   Right-sided weakness and  slurred speech, resolved Transient ischemic attack -admit forTelemetry monitoring -Stroke protocol -Allow for permissive hypertension for the first 24-48h - only treat PRN if SBP >220 mmHg. Blood pressures can be gradually normalized to SBP<140 upon discharge. -MRI brain without contrast -Maintain Euthermia.  -ASA ordered.  -Echocardiogram -Lipid Panel, TSH and A1C -Frequent neuro checks -Atorvastatin PO within 24 hrs.  -Risk factor modification -Consult Neurology -PT/OT eval -Aspiration precaution-has already been evaluated outpatient with barium swallow with concerns of aspiration risk.  I have spoken with the family and the patient extensively about this and advised them to follow aspiration precaution.  Essential hypertension -Holding antihypertensives  Hyperlipidemia -Continue statin  History of prostate cancer -In remission for 5 years  Glaucoma -Continue eyedrops    DVT prophylaxis: Early ambulation  Code Status: Full  Family Communication: Daughter at bedside  Disposition Plan:  TBD Consults called: None  Admission status: Obs tele    Ankit Arsenio Loader MD [facility] Pager [phone]  If 7PM-7AM, please contact night-coverage [email or web address] Password [facility]  03/01/2017, 11:43 PM

## 2017-03-01 NOTE — ED Triage Notes (Signed)
Pt arrives from home via [facility] for R sided facial droop and RUE drift with weakness that resolved PTA.  EMS reports pt has hx TIA with slurred speech at baseline. No neuro deficits noted on arrival, pt AOx4, Dr. Rogene Houston at bedside.

## 2017-03-01 NOTE — ED Notes (Signed)
Hospitalist at bedside 

## 2017-03-02 ENCOUNTER — Observation Stay (HOSPITAL_BASED_OUTPATIENT_CLINIC_OR_DEPARTMENT_OTHER): Payer: Medicare HMO

## 2017-03-02 ENCOUNTER — Observation Stay (HOSPITAL_COMMUNITY): Payer: Medicare HMO

## 2017-03-02 ENCOUNTER — Other Ambulatory Visit: Payer: Self-pay

## 2017-03-02 ENCOUNTER — Encounter (HOSPITAL_COMMUNITY): Payer: Self-pay | Admitting: *Deleted

## 2017-03-02 DIAGNOSIS — G459 Transient cerebral ischemic attack, unspecified: Secondary | ICD-10-CM | POA: Diagnosis not present

## 2017-03-02 DIAGNOSIS — I351 Nonrheumatic aortic (valve) insufficiency: Secondary | ICD-10-CM

## 2017-03-02 DIAGNOSIS — R531 Weakness: Secondary | ICD-10-CM

## 2017-03-02 DIAGNOSIS — R29810 Facial weakness: Secondary | ICD-10-CM | POA: Diagnosis not present

## 2017-03-02 DIAGNOSIS — R4781 Slurred speech: Secondary | ICD-10-CM | POA: Diagnosis not present

## 2017-03-02 DIAGNOSIS — I672 Cerebral atherosclerosis: Secondary | ICD-10-CM | POA: Diagnosis not present

## 2017-03-02 LAB — LIPID PANEL
CHOL/HDL RATIO: 3.3 ratio
Cholesterol: 90 mg/dL (ref 0–200)
HDL: 27 mg/dL — AB (ref >40)
LDL Cholesterol: 50 mg/dL (ref 0–99)
TRIGLYCERIDES: 65 mg/dL (ref <150)
VLDL: 13 mg/dL (ref 0–40)

## 2017-03-02 LAB — CBG MONITORING, ED
GLUCOSE-CAPILLARY: 128 mg/dL — AB (ref 65–99)
GLUCOSE-CAPILLARY: 77 mg/dL (ref 65–99)
Glucose-Capillary: 67 mg/dL (ref 65–99)
Glucose-Capillary: 158 mg/dL — ABNORMAL HIGH (ref 65–99)
Glucose-Capillary: 79 mg/dL (ref 65–99)

## 2017-03-02 LAB — HEMOGLOBIN A1C
Hgb A1c MFr Bld: 6.1 % — ABNORMAL HIGH (ref 4.8–5.6)
MEAN PLASMA GLUCOSE: 128.37 mg/dL

## 2017-03-02 LAB — VITAMIN B12: Vitamin B-12: 900 pg/mL (ref 180–914)

## 2017-03-02 LAB — ECHOCARDIOGRAM COMPLETE

## 2017-03-02 LAB — TSH: TSH: 1.539 u[IU]/mL (ref 0.350–4.500)

## 2017-03-02 MED ORDER — BRIMONIDINE TARTRATE 0.2 % OP SOLN
1.0000 [drp] | Freq: Two times a day (BID) | OPHTHALMIC | Status: DC
  Administered 2017-03-03: 1 [drp] via OPHTHALMIC

## 2017-03-02 MED ORDER — OFLOXACIN 0.3 % OP SOLN
1.0000 [drp] | Freq: Four times a day (QID) | OPHTHALMIC | Status: DC
  Administered 2017-03-02 – 2017-03-03 (×4): 1 [drp] via OPHTHALMIC

## 2017-03-02 MED ORDER — VALACYCLOVIR HCL 500 MG PO TABS: 1000.0000 mg | ORAL_TABLET | Freq: Three times a day (TID) | ORAL | Status: DC

## 2017-03-02 MED ORDER — BRIMONIDINE TARTRATE 0.2 % OP SOLN
1.0000 [drp] | Freq: Three times a day (TID) | OPHTHALMIC | Status: DC
  Administered 2017-03-02 (×3): 1 [drp] via OPHTHALMIC
  Filled 2017-03-02: qty 5

## 2017-03-02 MED ORDER — NON FORMULARY: Freq: Two times a day (BID) | Status: DC

## 2017-03-02 MED ORDER — DIFLUPREDNATE 0.05 % OP EMUL
1.0000 [drp] | Freq: Every day | OPHTHALMIC | Status: DC
  Administered 2017-03-03 (×5): 1 [drp] via OPHTHALMIC
  Filled 2017-03-02: qty 5

## 2017-03-02 MED ORDER — VALACYCLOVIR HCL 500 MG PO TABS
1000.0000 mg | ORAL_TABLET | Freq: Two times a day (BID) | ORAL | Status: DC
  Administered 2017-03-02 – 2017-03-03 (×2): 1000 mg via ORAL
  Filled 2017-03-02 (×4): qty 2

## 2017-03-02 MED ORDER — IOPAMIDOL (ISOVUE-370) INJECTION 76%
INTRAVENOUS | Status: AC
  Administered 2017-03-02: 50 mL
  Filled 2017-03-02: qty 50

## 2017-03-02 MED ORDER — INSULIN ASPART 100 UNIT/ML ~~LOC~~ SOLN: 0.0000 [IU] | Freq: Every day | SUBCUTANEOUS | Status: DC

## 2017-03-02 MED ORDER — INSULIN ASPART 100 UNIT/ML ~~LOC~~ SOLN: 0.0000 [IU] | Freq: Three times a day (TID) | SUBCUTANEOUS | Status: DC

## 2017-03-02 MED ORDER — ATORVASTATIN CALCIUM 80 MG PO TABS
80.0000 mg | ORAL_TABLET | Freq: Every day | ORAL | Status: DC
  Administered 2017-03-02 – 2017-03-03 (×2): 80 mg via ORAL
  Filled 2017-03-02 (×2): qty 1

## 2017-03-02 MED ORDER — DEXTROSE 50 % IV SOLN
1.0000 | Freq: Once | INTRAVENOUS | Status: AC
  Administered 2017-03-02: 50 mL via INTRAVENOUS
  Filled 2017-03-02: qty 50

## 2017-03-02 NOTE — ED Notes (Signed)
Patient transported to MRI 

## 2017-03-02 NOTE — Progress Notes (Signed)
*  PRELIMINARY RESULTS* Vascular Ultrasound Carotid Duplex (Doppler) has been completed.   Findings suggest 1-39% internal carotid artery stenosis bilaterally. Vertebral arteries are patent with antegrade flow.  03/02/2017 9:01 AM Maudry Mayhew, BS, RVT, RDCS, RDMS

## 2017-03-02 NOTE — ED Notes (Signed)
Attempted report x1. 

## 2017-03-02 NOTE — ED Notes (Signed)
Pt CBG was 67, notified Audrey(RN)

## 2017-03-02 NOTE — Consult Note (Signed)
Requesting Physician: Dr. Lucianne Lei    Chief Complaint: TIA  History obtained from:  Patient     HPI:                                                                                                                                         Derek Carr is an 84 y.o. male with hypertension and hyperlipidemia.  Per patient approximately 7:00 in the morning patient went to get a right cataract surgery.  He did not stop his aspirin or Plavix for this per patient.  He then took a nap at approximately 1300 hrs. patient awoke at 6 PM and daughter called EMSfor right-sided weakness, facial droop, dysarthria.  Daughter states that he was confused but patient states that he was having a difficult time understanding him which would be more of a receptive aphasia.    By the time EMS arrived (approximately 10 minutes ) all these symptoms had resolved the exception of the right upper extremity weakness--which also quickly resolved.  Patient is noted to have a TIA back in 2018 (as noted below ) and was placed on Plavix along with his aspirin.  Patient was not seen in the hospital at that time.  Currently patient has no symptoms.  Per daughter patient has had one further event prior to this.  At that time they noticed that he had a left facial droop along with leaning to the left.  He did not have any dysarthria at that time.  The symptoms lasted very quickly for approximately 10 minutes.    Date last known well: Date: 03/01/2017 Time last known well: Time: 13:00 tPA Given: No: Minimal symptoms NIH stroke scale at this point is 0 Modified Rankin: Rankin Score=0    Past Medical History:  Diagnosis Date  . Anemia   . Atherosclerosis   . Complication of anesthesia    CONFUSION - Pt's family very concerned about this  . DDD (degenerative disc disease)   . Degenerative arthritis   . Dermatitis, atopic ARMS AND LEGS  . Diverticulosis   . Erectile dysfunction   . Frequency of urination   . Glaucoma   .  History of asbestos exposure   . History of radiation therapy 8/19-13-10/18/11   prostate, 3 GY  . History of shingles 2012-- BILATERAL EYES--  NO RESIDUAL  . Hyperlipidemia   . Hypertension   . Inguinal hernia   . Lumbar scoliosis   . Nocturia   . OA (osteoarthritis) of hip RIGHT  . Prostate cancer ([place]) 05/11/2011   bx=Adenocarcinoma,gleason3+4=7, 4+4=8,PSA=10.30volume=45.7cc  . Renal cyst    bilateral  . Smokers' cough ([place])   . Thyroid cyst     Past Surgical History:  Procedure Laterality Date  . ATTEMPTED LEFT VATS/ LEFT THORACOTOMY/ RESECTION OF THE ENORMOUS, PROBABLE BRONCHOGENIC CYST  01-04-2005  DR Arlyce Dice  . CARDIAC CATHETERIZATION  02-24-2009  DR Cathie Olden  MINOR CORONARY ARTERY IRREGULARITIES/ NORMAL LVSF/ EF 65-70%  . CATARACT EXTRACTION    . COLONOSCOPY W/ POLYPECTOMY    . CYSTOSCOPY  11/15/2011   Procedure: CYSTOSCOPY;  Surgeon: Dutch Gray, MD;  Location: [facility];  Service: Urology;  Laterality: N/A;  no seeds seen in bladder  . esophageus cyst removal  YRS AGO  . [place]  . PROSTATE BIOPSY  05/11/11   Adenocarcinoma ([facility] OFFICE)  . RADIOACTIVE SEED IMPLANT  11/15/2011   Procedure: RADIOACTIVE SEED IMPLANT;  Surgeon: Dutch Gray, MD;  Location: [facility];  Service: Urology;  Laterality: N/A;  Total number of seeds - 52  . REPAIR RIGHT INGUINAL HERNIA W/ MESH  09-10-1999  . TOTAL HIP ARTHROPLASTY Right 03/20/2013   Procedure: RIGHT TOTAL HIP ARTHROPLASTY ANTERIOR APPROACH;  Surgeon: [name], MD;  Location: [facility];  Service: Orthopedics;  Laterality: Right;  . TOTAL HIP REVISION Right 05/14/2013   Procedure: open reduction internal fixation REVISION RIGHT HIP ;  Surgeon: [name], MD;  Location: [facility];  Service: Orthopedics;  Laterality: Right;  . UMBILICAL HERNIA REPAIR  1998   epigastric    Family History  Problem Relation Age of Onset  . Hypertension Mother   . Prostate cancer Brother         surgery/cured  . Lung cancer Brother        new primary/same brother   Social History:  reports that he has been smoking cigarettes.  He has a 31.00 pack-year smoking history. he has never used smokeless tobacco. He reports that he does not drink alcohol or use drugs.  Allergies:  Allergies  Allergen Reactions  . Dilaudid [Hydromorphone Hcl] Other (See Comments)    Confusion   . Methocarbamol Other (See Comments)    Drowsiness   . Percodan [Oxycodone-Aspirin] Other (See Comments)    Confusion   . Tramadol Other (See Comments)    Drowsiness   . Vicodin [Hydrocodone-Acetaminophen] Other (See Comments)    Confusion     Medications:                                                                                                                           Prior to Admission:  (Not in a hospital admission) Scheduled: . aspirin EC  81 mg Oral Daily  . brimonidine  1 drop Both Eyes TID  . calcium carbonate  2 tablet Oral QAC supper  . clopidogrel  75 mg Oral Daily  . Difluprednate  1 drop Right Eye Q2H  . ezetimibe  10 mg Oral q morning - 13K  . folic acid  1 mg Oral Daily  . ibuprofen  400 mg Oral Q breakfast  . mirabegron ER  50 mg Oral Daily  . nepafenac  1 drop Right Eye Daily  . ofloxacin  1 drop Both Eyes Q2H  . pravastatin  80 mg Oral QPM  . valACYclovir  1,000 mg  Oral TID   Continuous: . sodium chloride 50 mL/hr at 03/02/17 0949    ROS:                                                                                                                                       History obtained from the patient  General ROS: negative for - chills, fatigue, fever, night sweats, weight gain or weight loss Psychological ROS: negative for - , hallucinations, memory difficulties, mood swings or  Ophthalmic ROS: negative for - blurry vision, double vision, eye pain or loss of vision ENT ROS: negative for - epistaxis, nasal discharge, oral lesions, sore throat, tinnitus or  vertigo Respiratory ROS: negative for - cough,  shortness of breath or wheezing Cardiovascular ROS: negative for - chest pain, dyspnea on exertion,  Gastrointestinal ROS: negative for - abdominal pain, diarrhea,  nausea/vomiting or stool incontinence Genito-Urinary ROS: negative for - dysuria, hematuria, incontinence or urinary frequency/urgency Musculoskeletal ROS: negative for - joint swelling or muscular weakness Neurological ROS: as noted in HPI   General Examination:                                                                                                      Blood pressure (!) 178/81, pulse (!) 51, temperature 98.4 F (36.9 C), temperature source Oral, resp. rate (!) 0, SpO2 99 %.  HEENT-  Normocephalic, no lesions, without obvious abnormality.  Normal external eye and conjunctiva.   Cardiovascular- S1-S2 audible, pulses palpable throughout   Lungs-no rhonchi or wheezing noted, no excessive working breathing.  Saturations within normal limits Abdomen- All 4 quadrants palpated and nontender Extremities- Warm, dry and intact Musculoskeletal-no joint tenderness, deformity or swelling Skin-warm and dry, no hyperpigmentation, vitiligo, or suspicious lesions  Neurological Examination Mental Status: Alert,  thought content appropriate.  Speech fluent without evidence of aphasia.  Able to follow 3 step commands without difficulty. Not oriented to year.  Cranial Nerves: II:  Visual fields grossly normal,  III,IV, VI: ptosis not present, extra-ocular motions intact bilaterally, pupils right-7 mm nonreactive at this time however he had surgery on this high and left-3 mm and reactive both around V,VII: smile symmetric, facial light touch sensation normal bilaterally VIII: hearing normal bilaterally IX,X: uvula rises symmetrically XI: bilateral shoulder shrug XII: midline tongue extension Motor: Right : Upper extremity   5/5    Left:     Upper extremity   5/5  Lower extremity    5/5     Lower  extremity   5/5 Tone and bulk:normal tone throughout; no atrophy noted Sensory: Pinprick and light touch intact throughout, bilaterally Deep Tendon Reflexes: 2+ and symmetric throughout Plantars: Right: downgoing   Left: downgoing Cerebellar: normal finger-to-nose,  and normal heel-to-shin test    Lab Results: Basic Metabolic Panel: Recent Labs  Lab 03/01/17 1909 03/01/17 1920  NA 140 143  K 4.2 4.2  CL 111 108  CO2 21*  --   GLUCOSE 102* 99  BUN 19 23*  CREATININE 1.42* 1.50*  CALCIUM 9.1  --     CBC: Recent Labs  Lab 03/01/17 1909 03/01/17 1920  WBC 2.8*  --   NEUTROABS 1.6*  --   HGB 11.2* 11.9*  HCT 34.4* 35.0*  MCV 88.0  --   PLT 178  --     Lipid Panel: Recent Labs  Lab 03/02/17 0258  CHOL 90  TRIG 65  HDL 27*  CHOLHDL 3.3  VLDL 13  LDLCALC 50    CBG: Recent Labs  Lab 03/01/17 1909 03/02/17 0050 03/02/17 0911  GLUCAP 96 77 67    Imaging: Ct Head Wo Contrast  Result Date: 03/01/2017 CLINICAL DATA:  Pt with right sided facial droop, RUE weakness, resolved. H/O TIA EXAM: CT HEAD WITHOUT CONTRAST TECHNIQUE: Contiguous axial images were obtained from the base of the skull through the vertex without intravenous contrast. COMPARISON:  12/23/2016 FINDINGS: Brain: No evidence of acute infarction, hemorrhage, hydrocephalus, extra-axial collection or mass lesion/mass effect. Mild white matter hypoattenuation noted consistent with chronic microvascular ischemic change. Vascular: No hyperdense vessel or unexpected calcification. Skull: Normal. Negative for fracture or focal lesion. Sinuses/Orbits: Globes and orbits are unremarkable. Visualized sinuses are essentially clear. A few of the inferior right mastoid air cells show fluid attenuation. Remaining mastoid air cells are clear. Other: None. IMPRESSION: 1. No acute intracranial abnormalities. 2. Mild chronic microvascular ischemic change. Electronically Signed   By: Lajean Manes M.D.   On:  03/01/2017 19:52   Mr Brain Wo Contrast  Result Date: 03/02/2017 CLINICAL DATA:  Right upper extremity weakness, right facial droop, and slurred speech. EXAM: MRI HEAD WITHOUT CONTRAST TECHNIQUE: Multiplanar, multiecho pulse sequences of the brain and surrounding structures were obtained without intravenous contrast. COMPARISON:  Head CT 03/01/2017 and MRI 04/15/2016 FINDINGS: Brain: There is no evidence of acute infarct, intracranial hemorrhage, mass, midline shift, or extra-axial fluid collection. T2 hyperintensities in the cerebral white matter and pons are unchanged from the prior MRI and nonspecific but compatible with mild chronic small vessel ischemic disease. There is mild cerebral atrophy. Dilated perivascular spaces are again seen in the right greater than left basal ganglia. Vascular: Major intracranial vascular flow voids are preserved. Skull and upper cervical spine: No suspicious marrow lesion. Upper cervical spondylosis. Sinuses/Orbits: Bilateral cataract extraction. Minimal scattered paranasal sinus mucosal thickening. Moderate right mastoid effusion, unchanged from the prior MRI. Other: None. IMPRESSION: 1. No acute intracranial abnormality. 2. Mild chronic small vessel ischemic disease. Electronically Signed   By: Logan Bores M.D.   On: 03/02/2017 07:14   HgbA1c,--6.1 fasting lipid panel--LDL 50 MRI of the brain showed no acute infarct Echocardiogram done and pending Bilateral carotid ultrasound pending Carotid doppler : Carotid Duplex (Doppler) has been completed.   Findings suggest 1-39% internal carotid artery stenosis bilaterally. Vertebral arteries are patent with antegrade flow.      Assessment and plan discussed with with attending physician and they are in agreement.    Etta Quill PA-C [facility] [phone]  03/02/2017, 9:47 AM  Assessment: 84 y.o. male with transient right facial droop, right arm weakness and slurred speech.  At this time all  symptoms have resolved.  MRI brain is negative for any acute stroke.  Patient does have stroke risk factors of hypertension hyperlipidemia.  Currently under's stroke protocol for evaluation and workup.  With clear sensorium but not knowing the year, I think there may be some underlying dementia. Will get TSH, B12.   Stroke Risk Factors - hyperlipidemia and hypertension  Recommend  - MRA of the brain without contrast - PT consult, OT consult, Speech consult - 80 mg of Atorvistatin - Prophylactic therapy-Antiplatelet med: Continue aspirin and Plavix - Risk factor modification -Telemetry monitoring - Frequent neuro checks - NPO until passes stroke swallow screen -TSH, B12 - please page stroke NP  Or  PA  Or MD from 8am -4 pm  as this patient from this time will be  followed by the stroke.   You can look them up on [email or web address]  Password [email or web address]   Roland Rack, MD [facility] [phone]  If 7pm- 7am, please page neurology on call as listed in [place].

## 2017-03-02 NOTE — Care Management Obs Status (Signed)
[place] NOTIFICATION   Patient Details  Name: Derek Carr MRN: [id number] Date of Birth: [birth year]   Medicare Observation Status Notification Given:  Yes    Carles Collet, RN 03/02/2017, 1:59 PM

## 2017-03-02 NOTE — Evaluation (Signed)
Occupational Therapy Evaluation Patient Details Name: Derek Carr MRN: [id number] DOB: [birth year] Today's Date: 03/02/2017    History of Present Illness Pt is a 84 y/o male admitted secondary to RUE weakness and R facial droop. CT and MRI negative for acute abnormality. PMH includes R THA and revision, TIA, slurred speech, glaucoma, HTN, scoliosis, and prostate cancer.    Clinical Impression   Pt admitted with above.  He presents to OT with mild balance deficits, very mild Rt UE weakness, and impaired cognition (not oriented to year and situation, but was able to problem solve through the correct answer).  Daughter indicates that current level of cognition is not his baseline - she questions if it could be due to Versed given yesterday during his cataract procedure as she reports he does not do well with medications).   He currently requires min guard assist for ADLs.  He was mod I PTA, including driving, and IADLs.  He was going to [facility] for vestibular and balance rehab.   At this point, I anticipate cognition will clear once he returns home, but have instructed daughter to notify MD if it does not and if it impacts his functional performance, he may benefit from [place].  At this time, no further acute OT indicated.  Wife and daughter will be able to provide needed supervision at discharge.     Follow Up Recommendations  No OT follow up;Supervision/Assistance - 24 hour(initially )    Equipment Recommendations  None recommended by OT    Recommendations for Other Services       Precautions / Restrictions Precautions Precautions: Fall Restrictions Weight Bearing Restrictions: No      Mobility Bed Mobility Overal bed mobility: Needs Assistance Bed Mobility: Supine to Sit;Sit to Supine     Supine to sit: Supervision Sit to supine: Supervision   General bed mobility comments: Supervision for safety and line management. No physical assist required.   Transfers Overall transfer level:  Needs assistance Equipment used: Rolling walker (2 wheeled) Transfers: Sit to/from [facility] Sit to Stand: Min guard Stand pivot transfers: Min guard       General transfer comment: min guard for safety     Balance Overall balance assessment: Needs assistance Sitting-balance support: No upper extremity supported;Feet supported Sitting balance-Leahy Scale: Good     Standing balance support: During functional activity;No upper extremity supported Standing balance-Leahy Scale: Fair Standing balance comment: static standing with min guard assist                            ADL either performed or assessed with clinical judgement   ADL Overall ADL's : Needs assistance/impaired Eating/Feeding: NPO   Grooming: Wash/dry hands;Wash/dry face;Oral care;Brushing hair;Min guard;Standing   Upper Body Bathing: Set up;Supervision/ safety;Sitting   Lower Body Bathing: Min guard;Sit to/from stand   Upper Body Dressing : Supervision/safety;Set up;Sitting   Lower Body Dressing: Sit to/from stand;Minimal assistance   Toilet Transfer: Min guard;Ambulation;RW   Toileting- Water quality scientist and Hygiene: Min guard;Sit to/from stand       Functional mobility during ADLs: Min guard;Rolling walker General ADL Comments: min guard for safety      Vision Baseline Vision/History: Wears glasses Wears Glasses: At all times Patient Visual Report: Blurring of vision Additional Comments: Pt underwent cataract surgery OD yesterday      Perception Perception Perception Tested?: Yes   Praxis Praxis Praxis tested?: Within functional limits    Pertinent Vitals/Pain Pain  Assessment: No/denies pain     Hand Dominance Right   Extremity/Trunk Assessment Upper Extremity Assessment Upper Extremity Assessment: Overall WFL for tasks assessed;RUE deficits/detail RUE Deficits / Details: grossly 4+/5   Lower Extremity Assessment Lower Extremity Assessment: Defer to PT  evaluation Cervical / Trunk Assessment Cervical / Trunk Assessment: Kyphotic   Communication Communication Communication: No difficulties   Cognition Arousal/Alertness: Awake/alert Behavior During Therapy: WFL for tasks assessed/performed Overall Cognitive Status: Impaired/Different from baseline Area of Impairment: Orientation                 Orientation Level: Time;Situation             General Comments: Pt states it's 5102 initially, but with cues, he was able to problem solve the correct time.  He also was intially unsure why he is in the hospital.   Daughter reports this is new.   She questions if it could be related to the Versed pt had yesterday during his cataract procedure.  She reports he typically doesn't do well with medications and they result in cognitive deficits    General Comments  daughter and wife in room.  Instructed them to inform MD if cognition does not clear upon return home, and to ask for prescription for OPOT if it doesn't clear.  They verbalize understanding and will provide 24 hour supervision initially     Exercises     Shoulder Instructions      Home Living Family/patient expects to be discharged to:: Private residence Living Arrangements: Spouse/significant other Available Help at Discharge: Family;Available 24 hours/day Type of Home: House Home Access: Stairs to enter [employer] of Steps: 4 front; 2 in back  Entrance Stairs-Rails: [employer] has a rail; back does not ) Home Layout: One level     Bathroom Shower/Tub: Teacher, early years/pre: Standard     Home Equipment: Crutches;Cane - single point;Walker - 2 wheels;Shower seat;Bedside commode          Prior Functioning/Environment Level of Independence: Independent with assistive device(s)        Comments: Used cane for mobility, otherwise was independent.         OT Problem List: Impaired balance (sitting and/or standing);Decreased cognition       OT Treatment/Interventions:      OT Goals(Current goals can be found in the care plan section) Acute Rehab OT Goals Patient Stated Goal: to go home  OT Goal Formulation: With patient/family  OT Frequency:     Barriers to D/C:            Co-evaluation              AM-PAC PT "6 Clicks" Daily Activity     Outcome Measure Help from another person eating meals?: None Help from another person taking care of personal grooming?: A Little Help from another person toileting, which includes using toliet, bedpan, or urinal?: A Little Help from another person bathing (including washing, rinsing, drying)?: A Little Help from another person to put on and taking off regular upper body clothing?: A Little Help from another person to put on and taking off regular lower body clothing?: A Little 6 Click Score: 19   End of Session Equipment Utilized During Treatment: Rolling walker;Gait belt  Activity Tolerance: Patient tolerated treatment well Patient left: in bed;with call bell/phone within reach;with bed alarm set;with family/visitor present  OT Visit Diagnosis: Unsteadiness on feet (R26.81)  Time: 2229-7989 OT Time Calculation (min): 34 min Charges:  OT General Charges $OT Visit: 1 Visit OT Evaluation $OT Eval Low Complexity: 1 Low OT Treatments $Self Care/Home Management : 8-22 mins G-Codes:     [facility], OTR/L [phone]   Lucille Passy M 03/02/2017, 3:40 PM

## 2017-03-02 NOTE — ED Notes (Signed)
Pt CBG was 158, notified Audrey(RN)

## 2017-03-02 NOTE — Progress Notes (Signed)
PHARMACY NOTE -  ANTIBIOTIC RENAL DOSE ADJUSTMENT   Request received for Pharmacy to assist with antibiotic renal dose adjustment.  Patient has been continued on valacyclovir. SCr 1.5, estimated CrCl ~30 ml/min  Dose of valacyclovir has been changed to 1g po q12 hours  No other dose adjustments to medications needed. Will sign off at this time.  Please reconsult if a change in clinical status warrants re-evaluation of dosage.  Erin Hearing PharmD., BCPS Clinical Pharmacist 03/02/2017 3:20 PM

## 2017-03-02 NOTE — Progress Notes (Signed)
  Echocardiogram 2D Echocardiogram has been performed.  Dezeray Puccio L Androw 03/02/2017, 9:30 AM

## 2017-03-02 NOTE — Evaluation (Signed)
Physical Therapy Evaluation Patient Details Name: Derek Carr MRN: [id number] DOB: 02-Apr-1931 Today's Date: 03/02/2017   History of Present Illness  Pt is a 84 y/o male admitted secondary to RUE weakness and R facial droop. CT and MRI negative for acute abnormality. PMH includes R THA and revision, TIA, slurred speech, glaucoma, HTN, scoliosis, and prostate cancer.   Clinical Impression  Pt admitted secondary to problem above with deficits below. Pt slightly unsteady during gait requiring min guard and use of RW. Per pt's family pt currently receiving therapy for vestibular/balance issues and asking about moving to office closer to [place]. Educated to speak with MD. Will continue to follow acutely to maximize functional mobility independence and safety.     Follow Up Recommendations Outpatient PT;Supervision for mobility/OOB(continuing therapy for vestibular/balance)    Equipment Recommendations  None recommended by PT    Recommendations for Other Services       Precautions / Restrictions Precautions Precautions: Fall Restrictions Weight Bearing Restrictions: No      Mobility  Bed Mobility Overal bed mobility: Needs Assistance Bed Mobility: Supine to Sit;Sit to Supine     Supine to sit: Supervision Sit to supine: Supervision   General bed mobility comments: Supervision for safety and line management. No physical assist required.   Transfers Overall transfer level: Needs assistance Equipment used: Rolling walker (2 wheeled) Transfers: Sit to/from Stand Sit to Stand: Min guard         General transfer comment: Min guard for safety. Increased time to perform.   Ambulation/Gait Ambulation/Gait assistance: Min guard Ambulation Distance (Feet): 100 Feet Assistive device: Rolling walker (2 wheeled) Gait Pattern/deviations: Step-through pattern;Trunk flexed;Decreased stride length Gait velocity: Decreased Gait velocity interpretation: Below normal speed for  age/gender General Gait Details: Slow, slightly unsteady gait, however, no LOB noted with use of RW. Required cues for proximity to device throughout gait. Educated about use of RW if pt feeling weaker and unsteadiness at home.  Stairs            Wheelchair Mobility    Modified Rankin (Stroke Patients Only)       Balance Overall balance assessment: Needs assistance Sitting-balance support: No upper extremity supported;Feet supported Sitting balance-Leahy Scale: Good     Standing balance support: Bilateral upper extremity supported;During functional activity Standing balance-Leahy Scale: Poor Standing balance comment: Reliant on BUE support                              Pertinent Vitals/Pain Pain Assessment: No/denies pain    Home Living Family/patient expects to be discharged to:: Private residence Living Arrangements: Spouse/significant other Available Help at Discharge: Family;Available 24 hours/day Type of Home: House Home Access: Stairs to enter Entrance Stairs-Rails: [employer] has a rail; back does not ) Technical brewer of Steps: 4 front; 2 in back  Home Layout: One level Home Equipment: Crutches;Cane - single point;Walker - 2 wheels;Shower seat;Bedside commode      Prior Function Level of Independence: Independent with assistive device(s)         Comments: Used cane for mobility, otherwise was independent.      Hand Dominance   Dominant Hand: Right    Extremity/Trunk Assessment   Upper Extremity Assessment Upper Extremity Assessment: Defer to OT evaluation    Lower Extremity Assessment Lower Extremity Assessment: LLE deficits/detail;RLE deficits/detail RLE Deficits / Details: Grossly 4/5 throughout  RLE Sensation: history of peripheral neuropathy LLE Deficits / Details: Grossly 4/5 throughout  LLE Sensation: history of peripheral neuropathy    Cervical / Trunk Assessment Cervical / Trunk Assessment: Kyphotic  Communication    Communication: No difficulties  Cognition Arousal/Alertness: Awake/alert Behavior During Therapy: WFL for tasks assessed/performed Overall Cognitive Status: Within Functional Limits for tasks assessed                                        General Comments General comments (skin integrity, edema, etc.): Pt's daughter reports pt getting vestibular therapy and also working on balance. Asking if there is an office closer and instructed to discuss with MD about switching locations.    Exercises     Assessment/Plan    PT Assessment Patient needs continued PT services  PT Problem List Decreased strength;Decreased balance;Decreased mobility;Decreased knowledge of use of DME       PT Treatment Interventions DME instruction;Gait training;Functional mobility training;Stair training;Therapeutic activities;Balance training;Therapeutic exercise;Neuromuscular re-education;Patient/family education    PT Goals (Current goals can be found in the Care Plan section)  Acute Rehab PT Goals Patient Stated Goal: to go home  PT Goal Formulation: With patient Time For Goal Achievement: 03/16/17 Potential to Achieve Goals: Good    Frequency Min 3X/week   Barriers to discharge        Co-evaluation               AM-PAC PT "6 Clicks" Daily Activity  Outcome Measure Difficulty turning over in bed (including adjusting bedclothes, sheets and blankets)?: None Difficulty moving from lying on back to sitting on the side of the bed? : A Little Difficulty sitting down on and standing up from a chair with arms (e.g., wheelchair, bedside commode, etc,.)?: Unable Help needed moving to and from a bed to chair (including a wheelchair)?: A Little Help needed walking in hospital room?: A Little Help needed climbing 3-5 steps with a railing? : A Lot 6 Click Score: 16    End of Session Equipment Utilized During Treatment: Gait belt Activity Tolerance: Patient tolerated treatment  well Patient left: in bed;with call bell/phone within reach;with family/visitor present Nurse Communication: Mobility status PT Visit Diagnosis: Unsteadiness on feet (R26.81);Muscle weakness (generalized) (M62.81)    Time: 0762-2633 PT Time Calculation (min) (ACUTE ONLY): 26 min   Charges:   PT Evaluation $PT Eval Low Complexity: 1 Low PT Treatments $Gait Training: 8-22 mins   PT G Codes:        Leighton Ruff, PT, DPT  Acute Rehabilitation Services  Pager: [phone]   Rudean Hitt 03/02/2017, 12:34 PM

## 2017-03-02 NOTE — ED Notes (Signed)
Ordered diet tray for pt  

## 2017-03-02 NOTE — Progress Notes (Signed)
Received report from [place], pt on ER overflow unit [place], room 5C08 at 1845. Pt picked up via wheelchair by this RN and taken back to [address], room 3W35 at 1905. Telemetry box placed on pt, and pt assisted into bed. Family at bedside. Pt instructed on how to use call bell, phone and bed controls. Pt comfortable in bed. Pt and family informed of shift change at this time and night RN would be caring for him tonight and staff would be back down to pt's room for shift report.

## 2017-03-02 NOTE — Progress Notes (Signed)
PHARMACIST - PHYSICIAN ORDER COMMUNICATION  CONCERNING: P&T Medication Policy on Herbal Medications  DESCRIPTION:  This patient's order for:  Ubiquinol  has been noted.  This product(s) is classified as an "herbal" or natural product. Due to a lack of definitive safety studies or FDA approval, nonstandard manufacturing practices, plus the potential risk of unknown drug-drug interactions while on inpatient medications, the Pharmacy and Therapeutics Committee does not permit the use of "herbal" or natural products of this type within [facility].   ACTION TAKEN: The pharmacy department is unable to verify this order at this time and your patient has been informed of this safety policy. Please reevaluate patient's clinical condition at discharge and address if the herbal or natural product(s) should be resumed at that time.

## 2017-03-02 NOTE — Progress Notes (Signed)
PROGRESS NOTE    Derek Carr  AUQ:[id number] DOB: 02-08-1931 DOA: 03/01/2017 PCP: [name], MD   Outpatient Specialists:    Brief Narrative:  AMONTAE Carr is a 84 y.o. male with medical history significant of dementia, prostate cancer in remission, hyperlipidemia, hypertension was brought to the ER for evaluation of right-sided weakness and facial droop.  Patient states that he was woken up by his daughter around 6 PM in the evening he was noted to have right upper extremity weakness, slurred speech and right-sided facial droop.  Although patient did not feel any of the symptoms daughter clearly noted this and due to this ended up calling EMS.  By the time EMS arrived patient had resolution of his slurred speech and right-sided facial droop but continued to have right upper extremity drift and this resolved by the time he came to the ER. Per the daughter patient had a similar TIA type of attack back in December 2018 and was started on Plavix along with his aspirin.  At that time he was asked to follow-up with neurologist Dr. Leonie Man outpatient and has a follow up appointment coming up.  In the meantime due to concerns of outpatient dysphagia he was seen by an ENT he ordered barium swallow and it was confirmed that patient does have some weakness with risk of aspiration     Assessment & Plan:   Active Problems:   Right sided weakness   Right-sided weakness and slurred speech, resolved Transient ischemic attack -MRI negative- MRA pending -CTA head/neck -Allow for permissive hypertension for the first 24-48h - only treat PRN if SBP >220 mmHg. Blood pressures can be gradually normalized to SBP<140 upon discharge. -ASA/plavix -Echocardiogram -Lipid Panel: LDL: 50, TSH: normal and A1C: 6.1 -Frequent neuro checks -Atorvastatin PO -PT/OT eval  Essential hypertension -Holding antihypertensives  Hyperlipidemia -Continue statin  History of prostate cancer -In remission for 5  years  Glaucoma -Continue eyedrops  Patient is on valtrex 1000 with some renal failure-- note sure this is the correct dose, have asked pharmacy to review-- ? neurotoxicity    DVT prophylaxis:  SCD's  Code Status: Full Code   Family Communication: Called daughter  Disposition Plan:  Home in AM if work up complete   Consultants:  neurology  Subjective: No SOB   Objective: Vitals:   03/02/17 0945 03/02/17 1000 03/02/17 1106 03/02/17 1130  BP: (!) 188/97 (!) 188/97 (!) 151/85 (!) 147/78  Pulse: (!) 55 (!) 53 60 (!) 59  Resp: 15 (!) 21 11 12   Temp:      TempSrc:      SpO2: 99% 99% 98% 98%   No intake or output data in the 24 hours ending 03/02/17 1505 There were no vitals filed for this visit.  Examination:  General exam: Appears calm and comfortable  Respiratory system: Clear to auscultation. Respiratory effort normal. Cardiovascular system: S1 & S2 heard, RRR. No JVD, murmurs, rubs, gallops or clicks. No pedal edema. Gastrointestinal system: Abdomen is nondistended, soft and nontender. No organomegaly or masses felt. Normal bowel sounds heard. Central nervous system: Alert and oriented. No focal neurological deficits. Extremities: Symmetric 5 x 5 power. Skin: No rashes, lesions or ulcers Psychiatry: Judgement and insight appear normal. Mood & affect appropriate.     Data Reviewed: I have personally reviewed following labs and imaging studies  CBC: Recent Labs  Lab 03/01/17 1909 03/01/17 1920  WBC 2.8*  --   NEUTROABS 1.6*  --   HGB 11.2* 11.9*  HCT 34.4* 35.0*  MCV 88.0  --   PLT 178  --    Basic Metabolic Panel: Recent Labs  Lab 03/01/17 1909 03/01/17 1920  NA 140 143  K 4.2 4.2  CL 111 108  CO2 21*  --   GLUCOSE 102* 99  BUN 19 23*  CREATININE 1.42* 1.50*  CALCIUM 9.1  --    GFR: CrCl cannot be calculated (Unknown ideal weight.). Liver Function Tests: Recent Labs  Lab 03/01/17 1909  AST 29  ALT 52  ALKPHOS 78  BILITOT 0.4    PROT 7.0  ALBUMIN 3.0*   No results for input(s): LIPASE, AMYLASE in the last 168 hours. No results for input(s): AMMONIA in the last 168 hours. Coagulation Profile: Recent Labs  Lab 03/01/17 1909  INR 1.04   Cardiac Enzymes: No results for input(s): CKTOTAL, CKMB, CKMBINDEX, TROPONINI in the last 168 hours. BNP (last 3 results) No results for input(s): PROBNP in the last 8760 hours. HbA1C: Recent Labs    03/02/17 0257  HGBA1C 6.1*   CBG: Recent Labs  Lab 03/02/17 0050 03/02/17 0911 03/02/17 1043 03/02/17 1114 03/02/17 1240  GLUCAP 77 67 158* 128* 79   Lipid Profile: Recent Labs    03/02/17 0258  CHOL 90  HDL 27*  LDLCALC 50  TRIG 65  CHOLHDL 3.3   Thyroid Function Tests: Recent Labs    03/02/17 0257  TSH 1.539   Anemia Panel: No results for input(s): VITAMINB12, FOLATE, FERRITIN, TIBC, IRON, RETICCTPCT in the last 72 hours. Urine analysis:    Component Value Date/Time   COLORURINE YELLOW 02/11/2016 [place] 02/11/2016 1830   LABSPEC 1.018 02/11/2016 1830   PHURINE 5.0 02/11/2016 1830   GLUCOSEU NEGATIVE 02/11/2016 1830   HGBUR NEGATIVE 02/11/2016 1830   BILIRUBINUR NEGATIVE 02/11/2016 1830   KETONESUR NEGATIVE 02/11/2016 1830   PROTEINUR NEGATIVE 02/11/2016 1830   UROBILINOGEN 0.2 05/14/2013 1400   NITRITE NEGATIVE 02/11/2016 1830   LEUKOCYTESUR NEGATIVE 02/11/2016 1830     )No results found for this or any previous visit (from the past 240 hour(s)).    Anti-infectives (From admission, onward)   Start     Dose/Rate Route Frequency Ordered Stop   03/02/17 0230  valACYclovir (VALTREX) tablet 1,000 mg     1,000 mg Oral 3 times daily 03/02/17 0210         Radiology Studies: Ct Head Wo Contrast  Result Date: 03/01/2017 CLINICAL DATA:  Pt with right sided facial droop, RUE weakness, resolved. H/O TIA EXAM: CT HEAD WITHOUT CONTRAST TECHNIQUE: Contiguous axial images were obtained from the base of the skull through the vertex  without intravenous contrast. COMPARISON:  12/23/2016 FINDINGS: Brain: No evidence of acute infarction, hemorrhage, hydrocephalus, extra-axial collection or mass lesion/mass effect. Mild white matter hypoattenuation noted consistent with chronic microvascular ischemic change. Vascular: No hyperdense vessel or unexpected calcification. Skull: Normal. Negative for fracture or focal lesion. Sinuses/Orbits: Globes and orbits are unremarkable. Visualized sinuses are essentially clear. A few of the inferior right mastoid air cells show fluid attenuation. Remaining mastoid air cells are clear. Other: None. IMPRESSION: 1. No acute intracranial abnormalities. 2. Mild chronic microvascular ischemic change. Electronically Signed   By: Lajean Manes M.D.   On: 03/01/2017 19:52   Mr Brain Wo Contrast  Result Date: 03/02/2017 CLINICAL DATA:  Right upper extremity weakness, right facial droop, and slurred speech. EXAM: MRI HEAD WITHOUT CONTRAST TECHNIQUE: Multiplanar, multiecho pulse sequences of the brain and surrounding structures were obtained  without intravenous contrast. COMPARISON:  Head CT 03/01/2017 and MRI 04/15/2016 FINDINGS: Brain: There is no evidence of acute infarct, intracranial hemorrhage, mass, midline shift, or extra-axial fluid collection. T2 hyperintensities in the cerebral white matter and pons are unchanged from the prior MRI and nonspecific but compatible with mild chronic small vessel ischemic disease. There is mild cerebral atrophy. Dilated perivascular spaces are again seen in the right greater than left basal ganglia. Vascular: Major intracranial vascular flow voids are preserved. Skull and upper cervical spine: No suspicious marrow lesion. Upper cervical spondylosis. Sinuses/Orbits: Bilateral cataract extraction. Minimal scattered paranasal sinus mucosal thickening. Moderate right mastoid effusion, unchanged from the prior MRI. Other: None. IMPRESSION: 1. No acute intracranial abnormality. 2. Mild  chronic small vessel ischemic disease. Electronically Signed   By: Logan Bores M.D.   On: 03/02/2017 07:14   Vas US Carotid (at [place] Only)  Result Date: 03/02/2017 Carotid Arterial Duplex Study Indications:  TIA. Risk Factors: Hypertension, hyperlipidemia. Examination Guidelines: A complete evaluation includes B-mode imaging, spectral doppler, color doppler, and power doppler as needed of all accessible portions of each vessel. Bilateral testing is considered an integral part of a complete examination. Limited examinations for reoccurring indications may be performed as noted.  Right Carotid Findings: +----------+--------+--------+--------+--------+--------+           PSV cm/sEDV cm/sStenosisDescribeComments +----------+--------+--------+--------+--------+--------+ CCA Prox  -117    -20                              +----------+--------+--------+--------+--------+--------+ CCA Distal-79     -19                              +----------+--------+--------+--------+--------+--------+ ICA Prox  -61     -11                              +----------+--------+--------+--------+--------+--------+ ICA Distal50      9                       tortuous +----------+--------+--------+--------+--------+--------+ ECA       52      7                                +----------+--------+--------+--------+--------+--------+ +----------+--------+-------+----------------+-------------------+           PSV cm/sEDV cmsDescribe        Arm Pressure (mmHG) +----------+--------+-------+----------------+-------------------+ [id number]             Multiphasic, WNL                    +----------+--------+-------+----------------+-------------------+ +---------+--------+--+--------+-+---------+ VertebralPSV cm/s42EDV cm/s9Antegrade +---------+--------+--+--------+-+---------+  Left Carotid Findings: +----------+--------+--------+--------+--------+--------+           PSV cm/sEDV  cm/sStenosisDescribeComments +----------+--------+--------+--------+--------+--------+ CCA Prox  152     29                               +----------+--------+--------+--------+--------+--------+ CCA Distal-70     -19                              +----------+--------+--------+--------+--------+--------+ ICA Prox  71      16                               +----------+--------+--------+--------+--------+--------+  ICA Distal-84     -25                              +----------+--------+--------+--------+--------+--------+ ECA       -78     -12                              +----------+--------+--------+--------+--------+--------+ +----------+--------+--------+--------+-------------------+ SubclavianPSV cm/sEDV cm/sDescribeArm Pressure (mmHG) +----------+--------+--------+--------+-------------------+           101                                         +----------+--------+--------+--------+-------------------+ +---------+--------+--+--------+--+---------+ VertebralPSV cm/s56EDV cm/s17Antegrade +---------+--------+--+--------+--+---------+  Final Interpretation: Right Carotid: Velocities in the right ICA are consistent with a 1-39% stenosis. Left Carotid: Velocities in the left ICA are consistent with a 1-39% stenosis. Vertebrals:  Both vertebral arteries were patent with antegrade flow. Subclavians: Normal flow hemodynamics were seen in bilateral subclavian              arteries. *See table(s) above for measurements and observations.  Electronically signed by Antony Contras on 03/02/2017 at 12:39:38 PM.         Scheduled Meds: . aspirin EC  81 mg Oral Daily  . brimonidine  1 drop Both Eyes TID  . calcium carbonate  2 tablet Oral QAC supper  . clopidogrel  75 mg Oral Daily  . Difluprednate  1 drop Right Eye Q2H  . ezetimibe  10 mg Oral q morning - 03U  . folic acid  1 mg Oral Daily  . ibuprofen  400 mg Oral Q breakfast  . mirabegron ER  50 mg Oral  Daily  . nepafenac  1 drop Right Eye Daily  . ofloxacin  1 drop Both Eyes Q2H  . pravastatin  80 mg Oral QPM  . valACYclovir  1,000 mg Oral TID   Continuous Infusions: . sodium chloride 50 mL/hr at 03/02/17 0949     LOS: 0 days    Time spent: 35 min    Geradine Girt, DO [facility] Pager [phone]  If 7PM-7AM, please contact night-coverage [email or web address] Password [email or web address] 03/02/2017, 3:05 PM

## 2017-03-02 NOTE — Evaluation (Signed)
Clinical/Bedside Swallow Evaluation Patient Details  Name: Derek Carr MRN: [id number] Date of Birth: 1931-11-27  Today's Date: 03/02/2017 Time: SLP Start Time (ACUTE ONLY): 0940 SLP Stop Time (ACUTE ONLY): 0950 SLP Time Calculation (min) (ACUTE ONLY): 10 min  Past Medical History:  Past Medical History:  Diagnosis Date  . Anemia   . Atherosclerosis   . Complication of anesthesia    CONFUSION - Pt's family very concerned about this  . DDD (degenerative disc disease)   . Degenerative arthritis   . Dermatitis, atopic ARMS AND LEGS  . Diverticulosis   . Erectile dysfunction   . Frequency of urination   . Glaucoma   . History of asbestos exposure   . History of radiation therapy 8/19-13-10/18/11   prostate, 78 GY  . History of shingles 2012-- BILATERAL EYES--  NO RESIDUAL  . Hyperlipidemia   . Hypertension   . Inguinal hernia   . Lumbar scoliosis   . Nocturia   . OA (osteoarthritis) of hip RIGHT  . Prostate cancer ([place]) 05/11/2011   bx=Adenocarcinoma,gleason3+4=7, 4+4=8,PSA=10.30volume=45.7cc  . Renal cyst    bilateral  . Smokers' cough ([place])   . Thyroid cyst    Past Surgical History:  Past Surgical History:  Procedure Laterality Date  . ATTEMPTED LEFT VATS/ LEFT THORACOTOMY/ RESECTION OF THE ENORMOUS, PROBABLE BRONCHOGENIC CYST  01-04-2005  DR Arlyce Dice  . CARDIAC CATHETERIZATION  02-24-2009  DR NASHER   MINOR CORONARY ARTERY IRREGULARITIES/ NORMAL LVSF/ EF 65-70%  . CATARACT EXTRACTION    . COLONOSCOPY W/ POLYPECTOMY    . CYSTOSCOPY  11/15/2011   Procedure: CYSTOSCOPY;  Surgeon: Dutch Gray, MD;  Location: [facility];  Service: Urology;  Laterality: N/A;  no seeds seen in bladder  . esophageus cyst removal  YRS AGO  . [place]  . PROSTATE BIOPSY  05/11/11   Adenocarcinoma ([facility] OFFICE)  . RADIOACTIVE SEED IMPLANT  11/15/2011   Procedure: RADIOACTIVE SEED IMPLANT;  Surgeon: Dutch Gray, MD;  Location: [facility];  Service:  Urology;  Laterality: N/A;  Total number of seeds - 52  . REPAIR RIGHT INGUINAL HERNIA W/ MESH  09-10-1999  . TOTAL HIP ARTHROPLASTY Right 03/20/2013   Procedure: RIGHT TOTAL HIP ARTHROPLASTY ANTERIOR APPROACH;  Surgeon: [name], MD;  Location: [facility];  Service: Orthopedics;  Laterality: Right;  . TOTAL HIP REVISION Right 05/14/2013   Procedure: open reduction internal fixation REVISION RIGHT HIP ;  Surgeon: [name], MD;  Location: [facility];  Service: Orthopedics;  Laterality: Right;  . UMBILICAL HERNIA REPAIR  1998   epigastric   HPI:  Derek Linn Donahueis a 84 y.o.malewith medical history significant ofdementia, prostate cancer in remission, hyperlipidemia, hypertension was brought to the ER for evaluation of right-sided weakness and facial droop. Pt had an OP MBS at [facility] regional about one month prior to current admission found to have trace sensed laryngeal penetration and signs of pharyngeal weakness with residual. Pt advmised to continue current diet but f/uwith SLP for exercise program. Current MRI negative, no CXR this admission.    Assessment / Plan / Recommendation Clinical Impression  Pt demonstrates signs of a mild oropharyngeal dysphagia consistent with MBS findings one month ago. Pt exhibits appearance of slightly impaired timing of swallow, possibly decreased hyolaryngeal movement. Slight intermittent throat clearing observed, consistent with MBS report of sensed penetration with ejection. Pt was able to verbalize second swallow strategy to clear residuals. Provided min cueing for this in the  room. Given no concern for acute change (MRI negative) no respiratory infection, recommend pt continue home diet and precautions. Pt was advised to f/u with SLP for therapy to address deficits. Family not present to discuss.  SLP Visit Diagnosis: Dysphagia, oropharyngeal phase (R13.12)    Aspiration Risk  Mild aspiration risk;Moderate aspiration risk    Diet Recommendation  Regular;Thin liquid   Liquid Administration via: Cup;Straw Medication Administration: Whole meds with liquid Supervision: Patient able to self feed Compensations: Slow rate;Small sips/bites;Clear throat intermittently;Multiple dry swallows after each bite/sip Postural Changes: Seated upright at 90 degrees    Other  Recommendations Oral Care Recommendations: Oral care BID   Follow up Recommendations Outpatient SLP;Home health SLP      Frequency and Duration            Prognosis        Swallow Study   General HPI: Derek Schoenberger Donahueis a 84 y.o.malewith medical history significant ofdementia, prostate cancer in remission, hyperlipidemia, hypertension was brought to the ER for evaluation of right-sided weakness and facial droop. Pt had an OP MBS at [facility] regional about one month prior to current admission found to have trace sensed laryngeal penetration and signs of pharyngeal weakness with residual. Pt advmised to continue current diet but f/uwith SLP for exercise program. Current MRI negative, no CXR this admission.  Type of Study: Bedside Swallow Evaluation Diet Prior to this Study: NPO Temperature Spikes Noted: No Respiratory Status: Room air History of Recent Intubation: No Behavior/Cognition: Alert;Cooperative;Pleasant mood Oral Cavity Assessment: Within Functional Limits Oral Care Completed by SLP: No Oral Cavity - Dentition: Adequate natural dentition Vision: Functional for self-feeding Self-Feeding Abilities: Able to feed self Patient Positioning: Upright in bed Baseline Vocal Quality: Wet Volitional Cough: Strong Volitional Swallow: Able to elicit    Oral/Motor/Sensory Function Overall Oral Motor/Sensory Function: Within functional limits   Ice Chips     Thin Liquid Thin Liquid: Impaired Presentation: Cup;Self Fed Pharyngeal  Phase Impairments: Suspected delayed Swallow;Decreased hyoid-laryngeal movement;Throat Clearing - Immediate;Wet Vocal Quality    Nectar  Thick Nectar Thick Liquid: Not tested   Honey Thick Honey Thick Liquid: Not tested   Puree Puree: Impaired Presentation: Self Fed;Spoon Pharyngeal Phase Impairments: Wet Vocal Quality;Decreased hyoid-laryngeal movement;Throat Clearing - Immediate   Solid   GO   Solid: Impaired Presentation: Self Fed Pharyngeal Phase Impairments: Wet Vocal Quality        Shinichi Anguiano, Katherene Ponto 03/02/2017,10:10 AM

## 2017-03-02 NOTE — ED Notes (Signed)
Patient transported to CT 

## 2017-03-03 DIAGNOSIS — R531 Weakness: Secondary | ICD-10-CM | POA: Diagnosis not present

## 2017-03-03 DIAGNOSIS — G459 Transient cerebral ischemic attack, unspecified: Secondary | ICD-10-CM | POA: Diagnosis not present

## 2017-03-03 LAB — BASIC METABOLIC PANEL
ANION GAP: 12 (ref 5–15)
BUN: 14 mg/dL (ref 6–20)
CHLORIDE: 106 mmol/L (ref 101–111)
CO2: 21 mmol/L — ABNORMAL LOW (ref 22–32)
Calcium: 9.5 mg/dL (ref 8.9–10.3)
Creatinine, Ser: 1.31 mg/dL — ABNORMAL HIGH (ref 0.61–1.24)
GFR calc Af Amer: 56 mL/min — ABNORMAL LOW (ref >60)
GFR, EST NON AFRICAN AMERICAN: 49 mL/min — AB (ref >60)
GLUCOSE: 110 mg/dL — AB (ref 65–99)
POTASSIUM: 3.8 mmol/L (ref 3.5–5.1)
Sodium: 139 mmol/L (ref 135–145)

## 2017-03-03 LAB — SEDIMENTATION RATE: Sed Rate: 64 mm/hr — ABNORMAL HIGH (ref 0–16)

## 2017-03-03 LAB — GLUCOSE, CAPILLARY: GLUCOSE-CAPILLARY: 69 mg/dL (ref 65–99)

## 2017-03-03 MED ORDER — NEOMYCIN-POLYMYXIN-DEXAMETH 3.5-10000-0.1 OP OINT
TOPICAL_OINTMENT | Freq: Four times a day (QID) | OPHTHALMIC | Status: DC
  Administered 2017-03-03 (×4): via OPHTHALMIC

## 2017-03-03 MED ORDER — IBUPROFEN 200 MG PO TABS: 200.0000 mg | ORAL_TABLET | Freq: Once | ORAL | Status: DC

## 2017-03-03 MED ORDER — NEOMYCIN-POLYMYXIN-DEXAMETH 3.5-10000-0.1 OP OINT
TOPICAL_OINTMENT | Freq: Two times a day (BID) | OPHTHALMIC | Status: DC
  Filled 2017-03-03: qty 3.5

## 2017-03-03 NOTE — Progress Notes (Signed)
SLP Cancellation Note  Patient Details Name: Derek Carr MRN: [id number] DOB: Mar 06, 1931   Cancelled treatment:       Reason Eval/Treat Not Completed: Other (comment). Pt already evaluated on 03/02/17. See SLP note that date. Will sign off   Harleen Fineberg, Katherene Ponto 03/03/2017, 2:28 PM

## 2017-03-03 NOTE — Discharge Summary (Signed)
Physician Discharge Summary  Derek Carr  KGY:[id number]  DOB: March 05, 1931  DOA: 03/01/2017 PCP: [name], MD  Admit date: 03/01/2017 Discharge date: 03/03/2017  Admitted From: Home Disposition: Home  Recommendations for Outpatient Follow-up:  1. Follow up with PCP in 1-2 weeks 2. Follow-up with neurologist in 6 weeks 3. Please obtain BMP/CBC in one week to monitor hemoglobin and creatinine 4. Please follow up on the following pending results: Lyme's disease, HIV, RPR, ANA, SPEP and angiotensin-converting enzyme  Home Health: PT/OT/SLP  Discharge Condition: Stable CODE STATUS: Full code Diet recommendation: Heart Healthy  Brief/Interim Summary: For full details see H&P/Progress note, but in brief, Derek Carr is a 84 year old male with medical history significant for dementia, hypertension and prostate cancer in remission presented to the emergency department complaining of right-sided weakness and facial droop.  Upon ED evaluation symptoms has already resolved and he was admitted for evaluation of TIA.  MRI of the brain was negative for acute stroke, neurology was consulted and recommended dual antiplatelet therapy with aspirin 81 mg and Plavix 75 mg daily.  Patient was evaluated by PT/OT/SLP who recommended outpatient therapy.  Echocardiogram was performed which showed 50-55% of ejection fraction, grade 1 diastolic dysfunction and no PFO.  Carotid Doppler shows no significant stenosis.  Patient has significant improvement now back to baseline therefore he was deemed stable for discharge to follow-up with his primary care doctor.  Subjective: Patient seen and examined, he have no complaints. Reports that symptoms have resolved. Denies chest pain SOB and dizziness. No acute events overnight. Participate with PT with issues.   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  Patient was seen by physical therapy, recommending home health PT On the day of the discharge the patient's vitals were stable, and no other acute medical condition were reported by patient. the patient was felt safe to be discharge to home  Discharge Instructions  You were cared for by a hospitalist during your hospital stay. If you have any questions about your discharge medications or the care you received while you were in the hospital after you are discharged, you can call the unit and asked to speak with the hospitalist on call if the hospitalist that took care of you is not available. Once you are discharged, your primary care physician will handle any further medical issues. Please note that NO REFILLS for any discharge medications will be authorized once you are discharged, as it is imperative that you return to your primary care physician (or establish a relationship with a primary care physician if you do not have one) for your aftercare needs so that they can reassess your need for medications and monitor your lab values.  Discharge Instructions    Ambulatory referral to Occupational Therapy   Complete by:  As directed    Ambulatory  referral to Physical  Therapy   Complete by:  As directed    Call MD for:  difficulty breathing, headache or visual disturbances   Complete by:  As directed    Call MD for:  extreme fatigue   Complete by:  As directed    Call MD for:  hives   Complete by:  As directed    Call MD for:  persistant dizziness or light-headedness   Complete by:  As directed    Call MD for:  persistant nausea and vomiting   Complete by:  As directed    Call MD for:  redness, tenderness, or signs of infection (pain, swelling, redness, odor or green/yellow discharge around incision site)   Complete by:  As directed    Call MD for:  severe uncontrolled pain   Complete by:  As directed    Call MD for:  temperature >100.4   Complete by:  As directed    Diet - low sodium heart healthy   Complete by:  As directed    Increase activity slowly   Complete by:  As directed      Allergies as of 03/03/2017      Reactions   Dilaudid [hydromorphone Hcl] Other (See Comments)   Confusion   Methocarbamol Other (See Comments)   Drowsiness   Percodan [oxycodone-aspirin] Other (See Comments)   Confusion   Tramadol Other (See Comments)   Drowsiness   Vicodin [hydrocodone-acetaminophen] Other (See Comments)   Confusion      Medication List    TAKE these medications   acetaminophen 500 MG tablet Commonly known as:  TYLENOL Take 1,000 mg by mouth 2 (two) times daily as needed (for pain).   aspirin 81 MG tablet Take 81 mg by mouth daily.   brimonidine 0.2 % ophthalmic solution Commonly known as:  ALPHAGAN Place 1 drop into the right eye 3 (three) times daily.   Calcium Carbonate Antacid 1177 MG Chew Chew 1 tablet by mouth daily.   clobetasol cream 0.05 % Commonly known as:  TEMOVATE Apply 1 application topically 2 (two) times daily. DO NOT USE ON THE FACE   clopidogrel 75 MG tablet Commonly known as:  PLAVIX Take 75 mg by mouth daily.   DHA OMEGA 3 PO Take 1,000 mg by mouth daily.   DUREZOL 0.05 % Emul Generic drug:   Difluprednate Place 1 drop into the right eye every 2 (two) hours.   ezetimibe 10 MG tablet Commonly known as:  ZETIA Take 10 mg by mouth every morning.   folic acid 1 MG tablet Commonly known as:  FOLVITE Take 1 mg by mouth daily.   ibuprofen 200 MG tablet Commonly known as:  ADVIL,MOTRIN Take 400 mg by mouth every morning.   ILEVRO 0.3 % ophthalmic suspension Generic drug:  nepafenac Place 1 drop into the right eye daily.   irbesartan 75 MG tablet Commonly known as:  AVAPRO Take 37.5 mg by mouth daily. **REPLACES VALSARTAN**   mirabegron ER 50 MG Tb24 tablet Commonly known as:  MYRBETRIQ Take 50 mg by mouth daily.   ofloxacin 0.3 % ophthalmic solution Commonly known as:  OCUFLOX Place 1 drop into both eyes every 2 (two) hours. Start 72 hours prior to surgery   pravastatin 80 MG tablet Commonly known as:  PRAVACHOL Take 80 mg by mouth every evening.   Ubiquinol 100 MG Caps Take 100 mg by mouth daily.   valACYclovir 1000 MG tablet Commonly known as:  VALTREX Take 1,000 mg  by mouth 3 (three) times daily.   VITAMIN B12 PO Take 1 tablet by mouth daily.   VITAMIN D-3 PO Take 1 capsule by mouth daily.      Follow-up Information    Garvin Fila, MD. Go on 03/21/2017.   Specialties:  Neurology, Radiology Why:  10:30 AM Contact information: [address] [place] [address] [phone]        [name], MD. Schedule an appointment as soon as possible for a visit in 1 week(s).   Specialty:  Internal Medicine Why:  Hospital follow-up Contact information: [address] [place] [address] [phone]          Allergies  Allergen Reactions  . Dilaudid [Hydromorphone Hcl] Other (See Comments)    Confusion   . Methocarbamol Other (See Comments)    Drowsiness   . Percodan [Oxycodone-Aspirin] Other (See Comments)    Confusion   . Tramadol Other (See Comments)    Drowsiness   . Vicodin [Hydrocodone-Acetaminophen] Other  (See Comments)    Confusion     Consultations: Neurology  Procedures/Studies: Ct Angio Head W Or Wo Contrast  Result Date: 03/02/2017 CLINICAL DATA:  84 y/o M; evaluation of TIA, right-sided facial droop and slurred speech. EXAM: CT ANGIOGRAPHY HEAD AND NECK TECHNIQUE: Multidetector CT imaging of the head and neck was performed using the standard protocol during bolus administration of intravenous contrast. Multiplanar CT image reconstructions and MIPs were obtained to evaluate the vascular anatomy. Carotid stenosis measurements (when applicable) are obtained utilizing NASCET criteria, using the distal internal carotid diameter as the denominator. CONTRAST:  14mL ISOVUE-370 IOPAMIDOL (ISOVUE-370) INJECTION 76% COMPARISON:  03/02/2017 MRI head. FINDINGS: CTA NECK FINDINGS Aortic arch: Standard branching. Imaged portion shows no evidence of aneurysm or dissection. No significant stenosis of the major arch vessel origins. Mild calcific atherosclerosis. Right carotid system: No evidence of dissection, stenosis (50% or greater) or occlusion. Mild non stenotic calcific atherosclerosis of the bifurcation. Dental hardware artifact partially obscures mid cervical ICA segments. Left carotid system: No evidence of dissection, stenosis (50% or greater) or occlusion. Dental hardware artifact partially obscures mid cervical ICA segments. Vertebral arteries: Left dominant. No evidence of dissection, stenosis (50% or greater) or occlusion. Skeleton: Moderate cervical spondylosis with multilevel disc and facet degenerative changes. Uncovertebral and facet hypertrophy narrows the bony neural foramen at multiple levels, moderate at the C5 through C7 levels. Additionally, there is multifactorial mild bony canal stenosis at C4-C7. Other neck: Negative. Upper chest: Negative. Review of the MIP images confirms the above findings CTA HEAD FINDINGS Anterior circulation: Fibrofatty plaque of distal right cavernous segment with  moderate to severe 70-80 % stenosis (series 8, image 85). Calcified plaque of bilateral carotid siphons with mild less than 50% paraclinoid stenosis bilaterally. No large vessel occlusion, aneurysm, or additional stenosis identified. Posterior circulation: No significant stenosis, proximal occlusion, aneurysm, or vascular malformation. Bilateral P2 mild stenosis. Non stenotic calcified plaque of bilateral V4 segments. Venous sinuses: As permitted by contrast timing, patent. Anatomic variants: Patent anterior communicating artery. No posterior communicating artery identified, likely hypoplastic or absent. Delayed phase: No abnormal intracranial enhancement. Review of the MIP images confirms the above findings IMPRESSION: CTA neck: 1. No evidence of dissection, occlusion, or high-grade stenosis by NASCET criteria. 2. Mild calcific atherosclerosis of aortic arch and right carotid bifurcation. 3. Moderate cervical spondylosis greatest at the C4-C7 levels. CTA head: 1. No large vessel occlusion or aneurysm identified. 2. Fibrofatty plaque of right distal cavernous segment with severe 70-80 %  stenosis. 3. Segments of mild stenosis and bilateral proximal PCA and paraclinoid ICA. Electronically Signed   By: Kristine Garbe M.D.   On: 03/02/2017 18:06   Ct Head Wo Contrast  Result Date: 03/01/2017 CLINICAL DATA:  Pt with right sided facial droop, RUE weakness, resolved. H/O TIA EXAM: CT HEAD WITHOUT CONTRAST TECHNIQUE: Contiguous axial images were obtained from the base of the skull through the vertex without intravenous contrast. COMPARISON:  12/23/2016 FINDINGS: Brain: No evidence of acute infarction, hemorrhage, hydrocephalus, extra-axial collection or mass lesion/mass effect. Mild white matter hypoattenuation noted consistent with chronic microvascular ischemic change. Vascular: No hyperdense vessel or unexpected calcification. Skull: Normal. Negative for fracture or focal lesion. Sinuses/Orbits: Globes and  orbits are unremarkable. Visualized sinuses are essentially clear. A few of the inferior right mastoid air cells show fluid attenuation. Remaining mastoid air cells are clear. Other: None. IMPRESSION: 1. No acute intracranial abnormalities. 2. Mild chronic microvascular ischemic change. Electronically Signed   By: Lajean Manes M.D.   On: 03/01/2017 19:52   Ct Angio Neck W Or Wo Contrast  Result Date: 03/02/2017 CLINICAL DATA:  84 y/o M; evaluation of TIA, right-sided facial droop and slurred speech. EXAM: CT ANGIOGRAPHY HEAD AND NECK TECHNIQUE: Multidetector CT imaging of the head and neck was performed using the standard protocol during bolus administration of intravenous contrast. Multiplanar CT image reconstructions and MIPs were obtained to evaluate the vascular anatomy. Carotid stenosis measurements (when applicable) are obtained utilizing NASCET criteria, using the distal internal carotid diameter as the denominator. CONTRAST:  39mL ISOVUE-370 IOPAMIDOL (ISOVUE-370) INJECTION 76% COMPARISON:  03/02/2017 MRI head. FINDINGS: CTA NECK FINDINGS Aortic arch: Standard branching. Imaged portion shows no evidence of aneurysm or dissection. No significant stenosis of the major arch vessel origins. Mild calcific atherosclerosis. Right carotid system: No evidence of dissection, stenosis (50% or greater) or occlusion. Mild non stenotic calcific atherosclerosis of the bifurcation. Dental hardware artifact partially obscures mid cervical ICA segments. Left carotid system: No evidence of dissection, stenosis (50% or greater) or occlusion. Dental hardware artifact partially obscures mid cervical ICA segments. Vertebral arteries: Left dominant. No evidence of dissection, stenosis (50% or greater) or occlusion. Skeleton: Moderate cervical spondylosis with multilevel disc and facet degenerative changes. Uncovertebral and facet hypertrophy narrows the bony neural foramen at multiple levels, moderate at the C5 through C7  levels. Additionally, there is multifactorial mild bony canal stenosis at C4-C7. Other neck: Negative. Upper chest: Negative. Review of the MIP images confirms the above findings CTA HEAD FINDINGS Anterior circulation: Fibrofatty plaque of distal right cavernous segment with moderate to severe 70-80 % stenosis (series 8, image 85). Calcified plaque of bilateral carotid siphons with mild less than 50% paraclinoid stenosis bilaterally. No large vessel occlusion, aneurysm, or additional stenosis identified. Posterior circulation: No significant stenosis, proximal occlusion, aneurysm, or vascular malformation. Bilateral P2 mild stenosis. Non stenotic calcified plaque of bilateral V4 segments. Venous sinuses: As permitted by contrast timing, patent. Anatomic variants: Patent anterior communicating artery. No posterior communicating artery identified, likely hypoplastic or absent. Delayed phase: No abnormal intracranial enhancement. Review of the MIP images confirms the above findings IMPRESSION: CTA neck: 1. No evidence of dissection, occlusion, or high-grade stenosis by NASCET criteria. 2. Mild calcific atherosclerosis of aortic arch and right carotid bifurcation. 3. Moderate cervical spondylosis greatest at the C4-C7 levels. CTA head: 1. No large vessel occlusion or aneurysm identified. 2. Fibrofatty plaque of right distal cavernous segment with severe 70-80 % stenosis. 3. Segments of mild stenosis and bilateral proximal  PCA and paraclinoid ICA. Electronically Signed   By: Kristine Garbe M.D.   On: 03/02/2017 18:06   Mr Brain Wo Contrast  Result Date: 03/02/2017 CLINICAL DATA:  Right upper extremity weakness, right facial droop, and slurred speech. EXAM: MRI HEAD WITHOUT CONTRAST TECHNIQUE: Multiplanar, multiecho pulse sequences of the brain and surrounding structures were obtained without intravenous contrast. COMPARISON:  Head CT 03/01/2017 and MRI 04/15/2016 FINDINGS: Brain: There is no evidence of  acute infarct, intracranial hemorrhage, mass, midline shift, or extra-axial fluid collection. T2 hyperintensities in the cerebral white matter and pons are unchanged from the prior MRI and nonspecific but compatible with mild chronic small vessel ischemic disease. There is mild cerebral atrophy. Dilated perivascular spaces are again seen in the right greater than left basal ganglia. Vascular: Major intracranial vascular flow voids are preserved. Skull and upper cervical spine: No suspicious marrow lesion. Upper cervical spondylosis. Sinuses/Orbits: Bilateral cataract extraction. Minimal scattered paranasal sinus mucosal thickening. Moderate right mastoid effusion, unchanged from the prior MRI. Other: None. IMPRESSION: 1. No acute intracranial abnormality. 2. Mild chronic small vessel ischemic disease. Electronically Signed   By: Logan Bores M.D.   On: 03/02/2017 07:14   Vas US Carotid (at [facility] Only)  Result Date: 03/02/2017 Carotid Arterial Duplex Study Indications:  TIA. Risk Factors: Hypertension, hyperlipidemia. Examination Guidelines: A complete evaluation includes B-mode imaging, spectral doppler, color doppler, and power doppler as needed of all accessible portions of each vessel. Bilateral testing is considered an integral part of a complete examination. Limited examinations for reoccurring indications may be performed as noted.  Right Carotid Findings: +----------+--------+--------+--------+--------+--------+           PSV cm/sEDV cm/sStenosisDescribeComments +----------+--------+--------+--------+--------+--------+ CCA Prox  -117    -20                              +----------+--------+--------+--------+--------+--------+ CCA Distal-79     -19                              +----------+--------+--------+--------+--------+--------+ ICA Prox  -61     -11                              +----------+--------+--------+--------+--------+--------+ ICA Distal50      9                        tortuous +----------+--------+--------+--------+--------+--------+ ECA       52      7                                +----------+--------+--------+--------+--------+--------+ +----------+--------+-------+----------------+-------------------+           PSV cm/sEDV cmsDescribe        Arm Pressure (mmHG) +----------+--------+-------+----------------+-------------------+ [id number]             Multiphasic, WNL                    +----------+--------+-------+----------------+-------------------+ +---------+--------+--+--------+-+---------+ VertebralPSV cm/s42EDV cm/s9Antegrade +---------+--------+--+--------+-+---------+  Left Carotid Findings: +----------+--------+--------+--------+--------+--------+           PSV cm/sEDV cm/sStenosisDescribeComments +----------+--------+--------+--------+--------+--------+ CCA Prox  152     29                               +----------+--------+--------+--------+--------+--------+  CCA Distal-70     -19                              +----------+--------+--------+--------+--------+--------+ ICA Prox  71      16                               +----------+--------+--------+--------+--------+--------+ ICA Distal-84     -25                              +----------+--------+--------+--------+--------+--------+ ECA       -78     -12                              +----------+--------+--------+--------+--------+--------+ +----------+--------+--------+--------+-------------------+ SubclavianPSV cm/sEDV cm/sDescribeArm Pressure (mmHG) +----------+--------+--------+--------+-------------------+           101                                         +----------+--------+--------+--------+-------------------+ +---------+--------+--+--------+--+---------+ VertebralPSV cm/s56EDV cm/s17Antegrade +---------+--------+--+--------+--+---------+  Final Interpretation: Right Carotid: Velocities in the right ICA  are consistent with a 1-39% stenosis. Left Carotid: Velocities in the left ICA are consistent with a 1-39% stenosis. Vertebrals:  Both vertebral arteries were patent with antegrade flow. Subclavians: Normal flow hemodynamics were seen in bilateral subclavian              arteries. *See table(s) above for measurements and observations.  Electronically signed by Antony Contras on 03/02/2017 at 12:39:38 PM.    Discharge Exam: Vitals:   03/03/17 0949 03/03/17 1407  BP: (!) 151/88 (!) 147/89  Pulse: 66 73  Resp: 18 18  Temp: 98.6 F (37 C) (!) 97.5 F (36.4 C)  SpO2: 98% 98%   Vitals:   03/03/17 0217 03/03/17 0512 03/03/17 0949 03/03/17 1407  BP: (!) 143/89 (!) 145/85 (!) 151/88 (!) 147/89  Pulse: 80 65 66 73  Resp: 18 18 18 18   Temp: 97.8 F (36.6 C) 98.9 F (37.2 C) 98.6 F (37 C) (!) 97.5 F (36.4 C)  TempSrc: Oral Oral Oral Oral  SpO2: 95% 98% 98% 98%  Weight:  81.9 kg (180 lb 8.9 oz)    Height:  6\' 3"  (1.905 m)      General: Pt is alert, awake, not in acute distress Cardiovascular: RRR, S1/S2 +, no rubs, no gallops Respiratory: CTA bilaterally, no wheezing, no rhonchi Abdominal: Soft, NT, ND, bowel sounds + Extremities: no edema Neurology: No focal findings, voice soft, weak gag reflex   The results of significant diagnostics from this hospitalization (including imaging, microbiology, ancillary and laboratory) are listed below for reference.     Microbiology: No results found for this or any previous visit (from the past 240 hour(s)).   Labs: BNP (last 3 results) No results for input(s): BNP in the last 8760 hours. Basic Metabolic Panel: Recent Labs  Lab 03/01/17 1909 03/01/17 1920 03/03/17 0822  NA 140 143 139  K 4.2 4.2 3.8  CL 111 108 106  CO2 21*  --  21*  GLUCOSE 102* 99 110*  BUN 19 23* 14  CREATININE 1.42* 1.50* 1.31*  CALCIUM 9.1  --  9.5   Liver Function Tests: Recent Labs  Lab 03/01/17  1909  AST 29  ALT 52  ALKPHOS 78  BILITOT 0.4  PROT 7.0   ALBUMIN 3.0*   No results for input(s): LIPASE, AMYLASE in the last 168 hours. No results for input(s): AMMONIA in the last 168 hours. CBC: Recent Labs  Lab 03/01/17 1909 03/01/17 1920  WBC 2.8*  --   NEUTROABS 1.6*  --   HGB 11.2* 11.9*  HCT 34.4* 35.0*  MCV 88.0  --   PLT 178  --    Cardiac Enzymes: No results for input(s): CKTOTAL, CKMB, CKMBINDEX, TROPONINI in the last 168 hours. BNP: Invalid input(s): POCBNP CBG: Recent Labs  Lab 03/02/17 0911 03/02/17 1043 03/02/17 1114 03/02/17 1240 03/02/17 1609  GLUCAP 67 158* 128* 79 69   D-Dimer No results for input(s): DDIMER in the last 72 hours. Hgb A1c Recent Labs    03/02/17 0257  HGBA1C 6.1*   Lipid Profile Recent Labs    03/02/17 0258  CHOL 90  HDL 27*  LDLCALC 50  TRIG 65  CHOLHDL 3.3   Thyroid function studies Recent Labs    03/02/17 0257  TSH 1.539   Anemia work up Recent Labs    03/02/17 0257  VITAMINB12 900   Urinalysis    Component Value Date/Time   COLORURINE YELLOW 02/11/2016 [place] 02/11/2016 1830   LABSPEC 1.018 02/11/2016 1830   PHURINE 5.0 02/11/2016 1830   GLUCOSEU NEGATIVE 02/11/2016 1830   HGBUR NEGATIVE 02/11/2016 1830   BILIRUBINUR NEGATIVE 02/11/2016 1830   KETONESUR NEGATIVE 02/11/2016 1830   PROTEINUR NEGATIVE 02/11/2016 1830   UROBILINOGEN 0.2 05/14/2013 1400   NITRITE NEGATIVE 02/11/2016 1830   LEUKOCYTESUR NEGATIVE 02/11/2016 1830   Sepsis Labs Invalid input(s): PROCALCITONIN,  WBC,  LACTICIDVEN Microbiology No results found for this or any previous visit (from the past 240 hour(s)).   Time coordinating discharge: 35 minutes  SIGNED:  Chipper Oman, MD  [facility] 03/03/2017, 3:51 PM  Pager please text page via  [email or web address]

## 2017-03-03 NOTE — Progress Notes (Signed)
Physical Therapy Treatment Patient Details Name: Derek Carr MRN: [id number] DOB: December 14, 1931 Today's Date: 03/03/2017    History of Present Illness Pt is a 84 y/o male admitted secondary to RUE weakness and R facial droop. CT and MRI negative for acute abnormality. PMH includes R THA and revision, TIA, slurred speech, glaucoma, HTN, scoliosis, and prostate cancer.     PT Comments    Patient progressing well towards PT goals. Tolerated stair training today with min guard assist for balance/safety. Pt continues to demonstrate difficulty with RW proximity and navigating RW around obstacles requiring Min A and cues for safety. Reports having blurry vision after recent eye surgery. Will continue to follow.     Follow Up Recommendations  Outpatient PT;Supervision for mobility/OOB(continuing therapy for vestibular/balance)     Equipment Recommendations  None recommended by PT    Recommendations for Other Services       Precautions / Restrictions Precautions Precautions: Fall Restrictions Weight Bearing Restrictions: No    Mobility  Bed Mobility Overal bed mobility: Needs Assistance Bed Mobility: Supine to Sit;Sit to Supine     Supine to sit: Supervision;HOB elevated Sit to supine: Supervision;HOB elevated   General bed mobility comments: Supervision for safety. No physical assist required.   Transfers Overall transfer level: Needs assistance Equipment used: Rolling walker (2 wheeled) Transfers: Sit to/from Stand Sit to Stand: Min guard         General transfer comment: min guard for safety   Ambulation/Gait Ambulation/Gait assistance: Min guard;Min assist Ambulation Distance (Feet): 120 Feet(x2 bouts) Assistive device: Rolling walker (2 wheeled) Gait Pattern/deviations: Step-through pattern;Trunk flexed;Decreased stride length;Staggering right;Staggering left Gait velocity: Decreased   General Gait Details: Slow, slightly unsteady gait staggering to right/left  and running into obstacles in hallway. Also needs cues for RW proximity. Min A-Min guard for safety.    Stairs Stairs: Yes   Stair Management: Step to pattern;Alternating pattern;Two rails Number of Stairs: 3 General stair comments: Cues for safety and technique, Step to pattern to descend steps and step through to ascend with BUE support on rails.   Wheelchair Mobility    Modified Rankin (Stroke Patients Only)       Balance Overall balance assessment: Needs assistance Sitting-balance support: Feet supported;No upper extremity supported Sitting balance-Leahy Scale: Good     Standing balance support: During functional activity Standing balance-Leahy Scale: Fair Standing balance comment: static standing with min guard assist                             Cognition   Behavior During Therapy: WFL for tasks assessed/performed Overall Cognitive Status: Impaired/Different from baseline Area of Impairment: Orientation                 Orientation Level: Disoriented to;Situation;Time             General Comments: States it is January but able to get with cues.       Exercises      General Comments        Pertinent Vitals/Pain Pain Assessment: No/denies pain    Home Living                      Prior Function            PT Goals (current goals can now be found in the care plan section) Progress towards PT goals: Progressing toward goals    Frequency    Min  3X/week      PT Plan Current plan remains appropriate    Co-evaluation              AM-PAC PT "6 Clicks" Daily Activity  Outcome Measure  Difficulty turning over in bed (including adjusting bedclothes, sheets and blankets)?: None Difficulty moving from lying on back to sitting on the side of the bed? : None Difficulty sitting down on and standing up from a chair with arms (e.g., wheelchair, bedside commode, etc,.)?: None Help needed moving to and from a bed to chair  (including a wheelchair)?: A Little Help needed walking in hospital room?: A Little Help needed climbing 3-5 steps with a railing? : A Little 6 Click Score: 21    End of Session Equipment Utilized During Treatment: Gait belt Activity Tolerance: Patient tolerated treatment well Patient left: in bed;with call bell/phone within reach;with bed alarm set Nurse Communication: Mobility status PT Visit Diagnosis: Unsteadiness on feet (R26.81);Muscle weakness (generalized) (M62.81)     Time: 2706-2376 PT Time Calculation (min) (ACUTE ONLY): 15 min  Charges:  $Gait Training: 8-22 mins                    G Codes:       Wray Kearns, PT, DPT [phone]     Meta 03/03/2017, 9:20 AM

## 2017-03-03 NOTE — Progress Notes (Signed)
Patient discharged home with family. Discharge information given. IV and telemetry discontinued. Patient questions asked and answered. Patient transported from unit via wheelchair with staff. Wendee Copp

## 2017-03-03 NOTE — Progress Notes (Signed)
NEUROHOSPITALISTS STROKE TEAM - DAILY PROGRESS NOTE   ADMISSION HISTORY: Derek Carr is an 84 y.o. male with hypertension and hyperlipidemia.  Per patient approximately 7:00 in the morning patient went to get a right cataract surgery.  He did not stop his aspirin or Plavix for this per patient.  He then took a nap at approximately 1300 hrs. patient awoke at 6 PM and daughter called EMSfor right-sided weakness, facial droop, dysarthria.  Daughter states that he was confused but patient states that he was having a difficult time understanding him which would be more of a receptive aphasia.    By the time EMS arrived (approximately 10 minutes ) all these symptoms had resolved the exception of the right upper extremity weakness--which also quickly resolved.  Patient is noted to have a TIA back in 2018 (as noted below ) and was placed on Plavix along with his aspirin.  Patient was not seen in the hospital at that time.    Currently patient has no symptoms.    Per daughter patient has had one further event prior to this.  At that time they noticed that he had a left facial droop along with leaning to the left.  He did not have any dysarthria at that time.  The symptoms lasted very quickly for approximately 10 minutes.  Date last known well: Date: 03/01/2017 Time last known well: Time: 13:00 tPA Given: No: Minimal symptoms NIH stroke scale at this point is 0 Modified Rankin: Rankin Score=0  SUBJECTIVE (INTERVAL HISTORY) Family is at the bedside. Patient is found laying in bed in NAD. Overall he feels his condition is unchanged. Voices no new complaints. No new events reported overnight.  Daughter describes gradual decline over the past 2 years with regards to mentation, ambulation and swallowing difficulties. She states the patient has been evaluated by ENT and had an EMG and Dx with axonal Neuropathy by Dr Jennings Books at [facility] neurology.  They already have an outpatient appointment with Dr Leonie Man for a full neurological workup on 03/21/2017   OBJECTIVE Lab Results: CBC:  Recent Labs  Lab 03/01/17 1909 03/01/17 1920  WBC 2.8*  --   HGB 11.2* 11.9*  HCT 34.4* 35.0*  MCV 88.0  --   PLT 178  --    BMP: Recent Labs  Lab 03/01/17 1909 03/01/17 1920 03/03/17 0822  NA 140 143 139  K 4.2 4.2 3.8  CL 111 108 106  CO2 21*  --  21*  GLUCOSE 102* 99 110*  BUN 19 23* 14  CREATININE 1.42* 1.50* 1.31*  CALCIUM 9.1  --  9.5   Liver Function Tests:  Recent Labs  Lab 03/01/17 1909  AST 29  ALT 52  ALKPHOS 78  BILITOT 0.4  PROT 7.0  ALBUMIN 3.0*   Thyroid Function Studies:  Recent Labs    03/02/17 0257  TSH 1.539   Coagulation Studies:  Recent Labs    03/01/17 1909  APTT 28  INR 1.04   [place] Work -Up:  Recent Labs    03/02/17 0257  VITAMINB12 900   Urine Drug Screen:     Component Value Date/Time   LABOPIA NONE DETECTED 02/11/2016 1830   COCAINSCRNUR NONE DETECTED 02/11/2016 1830   LABBENZ NONE DETECTED 02/11/2016 1830   AMPHETMU NONE DETECTED 02/11/2016 1830   THCU NONE DETECTED 02/11/2016 1830   LABBARB NONE DETECTED 02/11/2016 1830     PHYSICAL EXAM Temp:  [97.8 F (36.6 C)-98.9 F (37.2 C)] 98.6  F (37 C) (02/07 0949) Pulse Rate:  [54-80] 66 (02/07 0949) Resp:  [18-19] 18 (02/07 0949) BP: (141-151)/(78-99) 151/88 (02/07 0949) SpO2:  [95 %-99 %] 98 % (02/07 0949) Weight:  [81.9 kg (180 lb 8.9 oz)] 81.9 kg (180 lb 8.9 oz) (02/07 0512) General - Well nourished, well developed elderly African-American male, in no apparent distress HEENT-  Normocephalic,  Cardiovascular - Regular rate and rhythm  Respiratory - Lungs clear bilaterally. No wheezing. Abdomen - soft and non-tender, BS normal Extremities- no edema or cyanosis Neurological Examination Mental Status: Alert,  thought content appropriate.  Speech fluent without evidence of aphasia.  Able to follow 3 step commands without  difficulty. Not oriented to year. Jaw jerk is brisk. No tongue fasciculations noted. Voice is soft and at times difficulty here. Weak cough and gag Cranial Nerves: II:  Visual fields grossly normal,  III,IV, VI: ptosis not present, extra-ocular motions intact bilaterally, pupils right-7 mm nonreactive at this time however he had surgery on this eye and left-3 mm and reactive both around V,VII: smile symmetric, facial light touch sensation normal bilaterally VIII: hearing normal bilaterally IX,X: uvula rises symmetrically XI: bilateral shoulder shrug XII: midline tongue extension Motor: Right : Upper extremity   5/5    Left:     Upper extremity   5/5  Lower extremity   5/5     Lower extremity   5/5 Tone and bulk:normal tone throughout; no atrophy noted Sensory: Pinprick and light touch intact throughout, bilaterally Deep Tendon Reflexes: 2+ brisk and symmetric throughout Plantars: Right: downgoing   Left: downgoing Cerebellar: normal finger-to-nose,  and normal heel-to-shin test  IMAGING: I have personally reviewed the radiological images below and agree with the radiology interpretations. Ct Angio Head W Or Wo Contrast  Result Date: 03/02/2017 CLINICAL DATA:  84 y/o M; evaluation of TIA, right-sided facial droop and slurred speech. EXAM: CT ANGIOGRAPHY HEAD AND NECK TECHNIQUE: Multidetector CT imaging of the head and neck was performed using the standard protocol during bolus administration of intravenous contrast. Multiplanar CT image reconstructions and MIPs were obtained to evaluate the vascular anatomy. Carotid stenosis measurements (when applicable) are obtained utilizing NASCET criteria, using the distal internal carotid diameter as the denominator. CONTRAST:  58mL ISOVUE-370 IOPAMIDOL (ISOVUE-370) INJECTION 76% COMPARISON:  03/02/2017 MRI head. FINDINGS: CTA NECK FINDINGS Aortic arch: Standard branching. Imaged portion shows no evidence of aneurysm or dissection. No significant stenosis  of the major arch vessel origins. Mild calcific atherosclerosis. Right carotid system: No evidence of dissection, stenosis (50% or greater) or occlusion. Mild non stenotic calcific atherosclerosis of the bifurcation. Dental hardware artifact partially obscures mid cervical ICA segments. Left carotid system: No evidence of dissection, stenosis (50% or greater) or occlusion. Dental hardware artifact partially obscures mid cervical ICA segments. Vertebral arteries: Left dominant. No evidence of dissection, stenosis (50% or greater) or occlusion. Skeleton: Moderate cervical spondylosis with multilevel disc and facet degenerative changes. Uncovertebral and facet hypertrophy narrows the bony neural foramen at multiple levels, moderate at the C5 through C7 levels. Additionally, there is multifactorial mild bony canal stenosis at C4-C7. Other neck: Negative. Upper chest: Negative. Review of the MIP images confirms the above findings CTA HEAD FINDINGS Anterior circulation: Fibrofatty plaque of distal right cavernous segment with moderate to severe 70-80 % stenosis (series 8, image 85). Calcified plaque of bilateral carotid siphons with mild less than 50% paraclinoid stenosis bilaterally. No large vessel occlusion, aneurysm, or additional stenosis identified. Posterior circulation: No significant stenosis, proximal occlusion, aneurysm, or  vascular malformation. Bilateral P2 mild stenosis. Non stenotic calcified plaque of bilateral V4 segments. Venous sinuses: As permitted by contrast timing, patent. Anatomic variants: Patent anterior communicating artery. No posterior communicating artery identified, likely hypoplastic or absent. Delayed phase: No abnormal intracranial enhancement. Review of the MIP images confirms the above findings IMPRESSION: CTA neck: 1. No evidence of dissection, occlusion, or high-grade stenosis by NASCET criteria. 2. Mild calcific atherosclerosis of aortic arch and right carotid bifurcation. 3.  Moderate cervical spondylosis greatest at the C4-C7 levels. CTA head: 1. No large vessel occlusion or aneurysm identified. 2. Fibrofatty plaque of right distal cavernous segment with severe 70-80 % stenosis. 3. Segments of mild stenosis and bilateral proximal PCA and paraclinoid ICA. Electronically Signed   By: Kristine Garbe M.D.   On: 03/02/2017 18:06   Ct Head Wo Contrast  Result Date: 03/01/2017 CLINICAL DATA:  Pt with right sided facial droop, RUE weakness, resolved. H/O TIA EXAM: CT HEAD WITHOUT CONTRAST TECHNIQUE: Contiguous axial images were obtained from the base of the skull through the vertex without intravenous contrast. COMPARISON:  12/23/2016 FINDINGS: Brain: No evidence of acute infarction, hemorrhage, hydrocephalus, extra-axial collection or mass lesion/mass effect. Mild white matter hypoattenuation noted consistent with chronic microvascular ischemic change. Vascular: No hyperdense vessel or unexpected calcification. Skull: Normal. Negative for fracture or focal lesion. Sinuses/Orbits: Globes and orbits are unremarkable. Visualized sinuses are essentially clear. A few of the inferior right mastoid air cells show fluid attenuation. Remaining mastoid air cells are clear. Other: None. IMPRESSION: 1. No acute intracranial abnormalities. 2. Mild chronic microvascular ischemic change. Electronically Signed   By: Lajean Manes M.D.   On: 03/01/2017 19:52   Ct Angio Neck W Or Wo Contrast  Result Date: 03/02/2017 CLINICAL DATA:  84 y/o M; evaluation of TIA, right-sided facial droop and slurred speech. EXAM: CT ANGIOGRAPHY HEAD AND NECK TECHNIQUE: Multidetector CT imaging of the head and neck was performed using the standard protocol during bolus administration of intravenous contrast. Multiplanar CT image reconstructions and MIPs were obtained to evaluate the vascular anatomy. Carotid stenosis measurements (when applicable) are obtained utilizing NASCET criteria, using the distal internal  carotid diameter as the denominator. CONTRAST:  72mL ISOVUE-370 IOPAMIDOL (ISOVUE-370) INJECTION 76% COMPARISON:  03/02/2017 MRI head. FINDINGS: CTA NECK FINDINGS Aortic arch: Standard branching. Imaged portion shows no evidence of aneurysm or dissection. No significant stenosis of the major arch vessel origins. Mild calcific atherosclerosis. Right carotid system: No evidence of dissection, stenosis (50% or greater) or occlusion. Mild non stenotic calcific atherosclerosis of the bifurcation. Dental hardware artifact partially obscures mid cervical ICA segments. Left carotid system: No evidence of dissection, stenosis (50% or greater) or occlusion. Dental hardware artifact partially obscures mid cervical ICA segments. Vertebral arteries: Left dominant. No evidence of dissection, stenosis (50% or greater) or occlusion. Skeleton: Moderate cervical spondylosis with multilevel disc and facet degenerative changes. Uncovertebral and facet hypertrophy narrows the bony neural foramen at multiple levels, moderate at the C5 through C7 levels. Additionally, there is multifactorial mild bony canal stenosis at C4-C7. Other neck: Negative. Upper chest: Negative. Review of the MIP images confirms the above findings CTA HEAD FINDINGS Anterior circulation: Fibrofatty plaque of distal right cavernous segment with moderate to severe 70-80 % stenosis (series 8, image 85). Calcified plaque of bilateral carotid siphons with mild less than 50% paraclinoid stenosis bilaterally. No large vessel occlusion, aneurysm, or additional stenosis identified. Posterior circulation: No significant stenosis, proximal occlusion, aneurysm, or vascular malformation. Bilateral P2 mild stenosis. Non stenotic calcified  plaque of bilateral V4 segments. Venous sinuses: As permitted by contrast timing, patent. Anatomic variants: Patent anterior communicating artery. No posterior communicating artery identified, likely hypoplastic or absent. Delayed phase: No  abnormal intracranial enhancement. Review of the MIP images confirms the above findings IMPRESSION: CTA neck: 1. No evidence of dissection, occlusion, or high-grade stenosis by NASCET criteria. 2. Mild calcific atherosclerosis of aortic arch and right carotid bifurcation. 3. Moderate cervical spondylosis greatest at the C4-C7 levels. CTA head: 1. No large vessel occlusion or aneurysm identified. 2. Fibrofatty plaque of right distal cavernous segment with severe 70-80 % stenosis. 3. Segments of mild stenosis and bilateral proximal PCA and paraclinoid ICA. Electronically Signed   By: Kristine Garbe M.D.   On: 03/02/2017 18:06   Mr Brain Wo Contrast  Result Date: 03/02/2017 CLINICAL DATA:  Right upper extremity weakness, right facial droop, and slurred speech. EXAM: MRI HEAD WITHOUT CONTRAST TECHNIQUE: Multiplanar, multiecho pulse sequences of the brain and surrounding structures were obtained without intravenous contrast. COMPARISON:  Head CT 03/01/2017 and MRI 04/15/2016 FINDINGS: Brain: There is no evidence of acute infarct, intracranial hemorrhage, mass, midline shift, or extra-axial fluid collection. T2 hyperintensities in the cerebral white matter and pons are unchanged from the prior MRI and nonspecific but compatible with mild chronic small vessel ischemic disease. There is mild cerebral atrophy. Dilated perivascular spaces are again seen in the right greater than left basal ganglia. Vascular: Major intracranial vascular flow voids are preserved. Skull and upper cervical spine: No suspicious marrow lesion. Upper cervical spondylosis. Sinuses/Orbits: Bilateral cataract extraction. Minimal scattered paranasal sinus mucosal thickening. Moderate right mastoid effusion, unchanged from the prior MRI. Other: None. IMPRESSION: 1. No acute intracranial abnormality. 2. Mild chronic small vessel ischemic disease. Electronically Signed   By: Logan Bores M.D.   On: 03/02/2017 07:14   Vas US Carotid  Result  Date: 03/02/2017 Final Interpretation: Right Carotid: Velocities in the right ICA are consistent with a 1-39% stenosis. Left Carotid: Velocities in the left ICA are consistent with a 1-39% stenosis. Vertebrals: Both vertebral arteries were patent with antegrade flow. Subclavians: Normal flow hemodynamics were seen in bilateral subclavian arteries.  Echocardiogram:                                              Study Conclusions - Left ventricle: The cavity size was normal. Systolic function was   normal. The estimated ejection fraction was in the range of 50%   to 55%. Wall motion was normal; there were no regional wall   motion abnormalities. Doppler parameters are consistent with   abnormal left ventricular relaxation (grade 1 diastolic   dysfunction). - Aortic valve: There was mild to moderate regurgitation.     IMPRESSION: [name] is a 84 y.o. male with PMH of  HTN, HLD, CAD, Prostate Cancer with transient right facial droop, right arm weakness and slurred speech. On admission all of his symptoms have resolved.  MRI brain is negative.   Likely T left hemispheric TIA: Suspected Etiology: Small vessel disease  Resultant Symptoms: All symptoms have resolved Stroke Risk Factors: hyperlipidemia and hypertension Other Stroke Risk Factors: Advanced age, Cigarette smoker, CAD Also subacute gait speech and swallowing difficulties of unclear etiology. Recent EMG  suggest axonal polyneuropathy   Outstanding Stroke Work-up Studies:      Neurology Labs  PENDING  03/03/2017 ASSESSMENT:   ABCD 2 Score = 5 Neuro exam shows hypophonic speech with weak cough and gag and discharge her raising concern for pseudobulbar palsy but brain MRI does not show any old bilateral subcortical infarcts. Long discussion with daughters at bedside regarding progressive decline over the past 2 years. SLP evaluation for worsening dysphagia.  PLAN  03/03/2017: Continue  Aspirin/ Plavix/ Statin/ Zetia Frequent neuro checks Telemetry monitoring PT/OT/SLP Ongoing aggressive stroke risk factor management Patient counseled to be compliant with his antithrombotic medications Patient counseled on Lifestyle modifications including, Diet, Exercise, and Stress Follow up with [facility] in 6 weeks  R/O Neuromuscular Disorder Labs pending  Fibrofatty plaque of right distal cavernous segment with severe 70-80 % stenosis. Asymptomatic Outpatient Neurology follow up and surveillance   INTRACRANIAL Atherosclerosis &Stenosis: On DAPT therapy  DYSPHAGIA, progressively worsening x 2 years: NPO until passes SLP swallow evaluation Aspiration Precautions in progress  HYPERTENSION: Stable Long term BP goal normotensive. May slowly start home B/P medications, if necessary Home Meds: NONE  HYPERLIPIDEMIA:    Component Value Date/Time   CHOL 90 03/02/2017 0258   TRIG 65 03/02/2017 0258   HDL 27 (L) 03/02/2017 0258   CHOLHDL 3.3 03/02/2017 0258   VLDL 13 03/02/2017 0258   LDLCALC 50 03/02/2017 0258  Home Meds:  Zetia 10 mg and Pravachol 80 mg LDL  goal < 70 Continued on Zetia 10 mg and Pravachol 80 mg daily Continue statin at discharge  PRE-DIABETES: Lab Results  Component Value Date   HGBA1C 6.1 (H) 03/02/2017  HgbA1c goal < 7.0 Continue CBG monitoring and SSI to maintain glucose 140-180 mg/dl DM education   TOBACCO ABUSE Current smoker Smoking cessation counseling provided Nicotine patch offered  Other Active Problems: Active Problems:   Right sided weakness    Hospital day # 0 VTE prophylaxis: SCD's  Diet : Fall precautions Diet regular Room service appropriate? Yes; Fluid consistency: Thin   FAMILY UPDATES: No family at bedside  TEAM UPDATES: Doreatha Lew, MD   Prior Home Stroke Medications:  aspirin 81 mg daily and clopidogrel 75 mg daily  Discharge Stroke Meds:  Please discharge patient on aspirin 81 mg daily  and clopidogrel 75 mg daily   Disposition: 01-Home or Self Care Therapy Recs:               PENDING Follow Up:  Follow-up Information    Garvin Fila, MD. Go on 03/21/2017.   Specialties:  Neurology, Radiology Why:  10:30 AM Contact information: [address] [place] [address] [phone]          [name], MD -PCP Follow up in 1-2 weeks      Assessment & plan discussed with with attending physician and they are in agreement.    Mary Sella, ANP-C Stroke Neurology Team 03/03/2017 1:38 PM I have personally examined this patient, reviewed notes, independently viewed imaging studies, participated in medical decision making and plan of care.ROS completed by me personally and pertinent positives fully documented  I have made any additions or clarifications directly to the above note. Agree with note above. Patient has presented with transient episode of confusion and slurred speech and right-sided weakness likely due to left hemispheric TIA from small vessel disease. More concerning is his subacute symptoms of worsening gait, balance speech and swallowing which are of undetermined etiology. Recent evaluation by neurologist in [place] suggest axonal peripheral neuropathy but etiology of this is not clear. Recommend  lab work for neuropathy and speech therapy for swallow evaluation and physical therapy for gait and balance. Continue antiplatelet therapy for now on ongoing stroke workup. Long discussion with the patient and wife and daughter and other family members at the bedside and answered questions. Greater than 50% time during this 35 minute visit was spent on counseling and coordination of care about his TIA and pre-existing speech swallowing and gait difficulties in answering questions  Antony Contras, [place] Pager: [phone] 03/03/2017 3:07 PM  To contact Stroke Continuity provider, please refer to [email or web address] After  hours, contact General Neurology

## 2017-03-03 NOTE — Care Management Note (Signed)
Case Management Note  Patient Details  Name: Derek Carr MRN: [id number] Date of Birth: 1931/08/26  Subjective/Objective:                    Action/Plan: Pt discharging home with orders for [facility] services. CM spoke to the patient and his daughter, [name] about [facility] services. [name] would prefer he go to outpatient therapy. MD notified and in agreement. Orders in Epic and information on the AVS.  Family to provide transportation home.    Expected Discharge Date:  03/03/17               Expected Discharge Plan:  OP Rehab  In-House Referral:     Discharge planning Services  CM Consult  Post Acute Care Choice:    Choice offered to:     DME Arranged:    DME Agency:     HH Arranged:    HH Agency:     Status of Service:  Completed, signed off  If discussed at [employer] of Stay Meetings, dates discussed:    Additional Comments:  Pollie Friar, RN 03/03/2017, 4:52 PM

## 2017-03-04 LAB — PROTEIN ELECTROPHORESIS, SERUM
A/G Ratio: 0.9 (ref 0.7–1.7)
Albumin ELP: 3.2 g/dL (ref 2.9–4.4)
Alpha-1-Globulin: 0.2 g/dL (ref 0.0–0.4)
Alpha-2-Globulin: 0.8 g/dL (ref 0.4–1.0)
BETA GLOBULIN: 1.1 g/dL (ref 0.7–1.3)
GAMMA GLOBULIN: 1.4 g/dL (ref 0.4–1.8)
Globulin, Total: 3.6 g/dL (ref 2.2–3.9)
Total Protein ELP: 6.8 g/dL (ref 6.0–8.5)

## 2017-03-04 LAB — ANGIOTENSIN CONVERTING ENZYME: Angiotensin-Converting Enzyme: 65 U/L (ref 14–82)

## 2017-03-04 LAB — HIV ANTIBODY (ROUTINE TESTING W REFLEX): HIV Screen 4th Generation wRfx: NONREACTIVE

## 2017-03-04 LAB — RPR: RPR Ser Ql: NONREACTIVE

## 2017-03-08 ENCOUNTER — Ambulatory Visit: Payer: Medicare HMO

## 2017-03-08 DIAGNOSIS — R42 Dizziness and giddiness: Secondary | ICD-10-CM | POA: Diagnosis not present

## 2017-03-08 LAB — FANA STAINING PATTERNS: Homogeneous Pattern: 1:320 {titer} — ABNORMAL HIGH

## 2017-03-08 LAB — ANTINUCLEAR ANTIBODIES, IFA: ANA Ab, IFA: POSITIVE — AB

## 2017-03-10 ENCOUNTER — Ambulatory Visit: Payer: Medicare HMO | Attending: Internal Medicine

## 2017-03-10 DIAGNOSIS — R2681 Unsteadiness on feet: Secondary | ICD-10-CM | POA: Insufficient documentation

## 2017-03-10 DIAGNOSIS — R471 Dysarthria and anarthria: Secondary | ICD-10-CM | POA: Diagnosis not present

## 2017-03-10 DIAGNOSIS — I69815 Cognitive social or emotional deficit following other cerebrovascular disease: Secondary | ICD-10-CM | POA: Diagnosis not present

## 2017-03-10 DIAGNOSIS — R1312 Dysphagia, oropharyngeal phase: Secondary | ICD-10-CM | POA: Diagnosis not present

## 2017-03-10 DIAGNOSIS — R2689 Other abnormalities of gait and mobility: Secondary | ICD-10-CM | POA: Diagnosis not present

## 2017-03-10 DIAGNOSIS — R41841 Cognitive communication deficit: Secondary | ICD-10-CM | POA: Insufficient documentation

## 2017-03-10 DIAGNOSIS — R41842 Visuospatial deficit: Secondary | ICD-10-CM | POA: Diagnosis not present

## 2017-03-10 DIAGNOSIS — R278 Other lack of coordination: Secondary | ICD-10-CM | POA: Diagnosis not present

## 2017-03-10 DIAGNOSIS — R42 Dizziness and giddiness: Secondary | ICD-10-CM | POA: Diagnosis not present

## 2017-03-10 DIAGNOSIS — M6281 Muscle weakness (generalized): Secondary | ICD-10-CM | POA: Insufficient documentation

## 2017-03-10 NOTE — Patient Instructions (Addendum)
   SWALLOWING EXERCISES Do these as indicated for 8 weeks, then 2 times per week afterwards   1. Effortful Swallows - Press your tongue against the roof of your mouth for 3 seconds, then squeeze the muscles in your neck while you swallow your saliva or a sip of water - Repeat 10-15 times, 2-3 times a day, and use whenever you eat or drink  2. Masako Swallow - swallow with your tongue sticking out - Stick tongue out past your teeth and gently bite tongue with your teeth - Swallow, while holding your tongue with your teeth - Repeat 10-15 times, 2-3 times a day *use a wet spoon if your mouth gets dry*  3. Mendelsohn Maneuver - "half swallow" exercise - Start to swallow, and keep your Adam's apple up by squeezing hard with the            muscles of the throat - Hold the squeeze for 5-7 seconds and then relax - Repeat 10-15 times, 2-3 times a day *use a wet spoon if your mouth gets dry*  4. Chin tuck against resistance - Open your mouth  - Place your fist UNDER your chin near your neck, and push back with your fist for 5 seconds - Repeat 10 times, 2-3 times a day        5.  Open mouth swallow  - Open your mouth and swallow your saliva  - Repeat 10-15 times, 2-3 times a day        6.   Lip strength - Blow through a straw for 1 second then plug straw for 3 seconds and KEEP THE AIR IN YOUR MOUTH!             - Repeat 10 times, 2-3 times a day         WHEN YOU EAT:  - Smaller sips and smaller bites  - Clear throat or cough and SWALLOW intermittently, during your meal  - Take 2-3 swallows for each bite or sip  - Have FOOD or LIQUID in your mouth at one time - do not mix them together

## 2017-03-11 DIAGNOSIS — R42 Dizziness and giddiness: Secondary | ICD-10-CM | POA: Diagnosis not present

## 2017-03-11 NOTE — Therapy (Signed)
[facility] [address] [place], [place], [address] Phone: [phone]   Fax:  [phone]  Speech Language Pathology Evaluation  Patient Details  Name: Derek Carr MRN: [id number] Date of Birth: 1931/06/02 Referring Provider: [name], MD   Encounter Date: 03/10/2017  End of Session - 03/10/17 2351    Visit Number  1    Number of Visits  17    Date for SLP Re-Evaluation  05/20/17    SLP Start Time  1405    SLP Stop Time   1447    SLP Time Calculation (min)  42 min    Activity Tolerance  Patient tolerated treatment well       Past Medical History:  Diagnosis Date  . Anemia   . Atherosclerosis   . Complication of anesthesia    CONFUSION - Pt's family very concerned about this  . DDD (degenerative disc disease)   . Degenerative arthritis   . Dermatitis, atopic ARMS AND LEGS  . Diverticulosis   . Erectile dysfunction   . Frequency of urination   . Glaucoma   . History of asbestos exposure   . History of radiation therapy 8/19-13-10/18/11   prostate, 9 GY  . History of shingles 2012-- BILATERAL EYES--  NO RESIDUAL  . Hyperlipidemia   . Hypertension   . Inguinal hernia   . Lumbar scoliosis   . Nocturia   . OA (osteoarthritis) of hip RIGHT  . Prostate cancer ([place]) 05/11/2011   bx=Adenocarcinoma,gleason3+4=7, 4+4=8,PSA=10.30volume=45.7cc  . Renal cyst    bilateral  . Smokers' cough ([place])   . Thyroid cyst     Past Surgical History:  Procedure Laterality Date  . ATTEMPTED LEFT VATS/ LEFT THORACOTOMY/ RESECTION OF THE ENORMOUS, PROBABLE BRONCHOGENIC CYST  01-04-2005  DR Arlyce Dice  . CARDIAC CATHETERIZATION  02-24-2009  DR NASHER   MINOR CORONARY ARTERY IRREGULARITIES/ NORMAL LVSF/ EF 65-70%  . CATARACT EXTRACTION    . COLONOSCOPY W/ POLYPECTOMY    . CYSTOSCOPY  11/15/2011   Procedure: CYSTOSCOPY;  Surgeon: Dutch Gray, MD;  Location[facility];  Service: Urology;  Laterality: N/A;   no seeds seen in bladder  . esophageus cyst removal  YRS AGO  . [place]  . PROSTATE BIOPSY  05/11/11   Adenocarcinoma ([facility] OFFICE)  . RADIOACTIVE SEED IMPLANT  11/15/2011   Procedure: RADIOACTIVE SEED IMPLANT;  Surgeon: Dutch Gray, MD;  Location: [facility];  Service: Urology;  Laterality: N/A;  Total number of seeds - 52  . REPAIR RIGHT INGUINAL HERNIA W/ MESH  09-10-1999  . TOTAL HIP ARTHROPLASTY Right 03/20/2013   Procedure: RIGHT TOTAL HIP ARTHROPLASTY ANTERIOR APPROACH;  Surgeon: [name], MD;  Location: [facility];  Service: Orthopedics;  Laterality: Right;  . TOTAL HIP REVISION Right 05/14/2013   Procedure: open reduction internal fixation REVISION RIGHT HIP ;  Surgeon: [name], MD;  Location: [facility];  Service: Orthopedics;  Laterality: Right;  . UMBILICAL HERNIA REPAIR  1998   epigastric    There were no vitals filed for this visit.  Subjective Assessment - 03/10/17 1413    Subjective  Pt arrives with daughter. Hydrophonic voice.    Patient is accompained by:  Family member Derek Carr, dtr    Currently in Pain?  No/denies         SLP Evaluation [facility] - 03/10/17 1416      SLP Visit Information   SLP Received On  03/10/17  Referring Provider  [name], MD    Onset Date  03-01-17    Medical Diagnosis  Dysphagia      General Information   HPI  Derek Sennett Donahueis a 84 y.o.malewith medical history significant ofdementia, prostate cancer in remission, hyperlipidemia, hypertension was brought to the ER for evaluation of right-sided weakness and facial droop 03/01/17. Pt had an OP MBS at [facility] about one month prior to most current admission; found to have trace sensed laryngeal penetration and signs of pharyngeal weakness with residual. Pt advised to continue current diet but f/uwith SLP for exercise program. Current MRI negative, no CXR this admission.       Balance Screen   Has the patient fallen in the past 6 months  Yes "near  falls"    How many times?  at least once per day    Has the patient had a decrease in activity level because of a fear of falling?   Yes    Is the patient reluctant to leave their home because of a fear of falling?   No      Prior Functional Status   Cognitive/Linguistic Baseline  Baseline deficits    Baseline deficit details  memory    Type of Home  House    Available Support  Family      Cognition   Overall Cognitive Status  History of cognitive impairments - at baseline memory    Memory  Impaired    Memory Impairment  Decreased short term memory decr'd awareness of precautions/safety      Oral Motor/Sensory Function   Overall Oral Motor/Sensory Function  Impaired    Labial ROM  Within Functional Limits    Labial Symmetry  Within Functional Limits    Labial Strength  Reduced Right    Labial Coordination  Reduced    Lingual ROM  Within Functional Limits    Lingual Symmetry  Within Functional Limits    Lingual Strength  Reduced Right    Lingual Coordination  Reduced    Facial ROM  Within Functional Limits      Motor Speech   Overall Motor Speech  Impaired    Articulation  Impaired    Level of Impairment  Phrase    Intelligibility  Intelligible     Pt's conversational loudness was below WNL (low 70s dB when sound level meter placed 30cm from pt's mouth) throughout exam today.  Pt was unable to ID hydrophonic voice with SLP today and will require incr'd awareness to properly clear secretions in the laryngeal area.                 SLP Education - 03/10/17 2350    Education provided  Yes    Education Details  effortful swallow, swallow precautions, hydrophonic voice and clearance of this with throat clear/reswallow    Person(s) Educated  Patient;Child(ren)    Methods  Explanation;Demonstration;Tactile cues;Handout    Comprehension  Verbalized understanding;Returned demonstration;Verbal cues required;Need further instruction       SLP Short Term Goals -  03/10/17 2357      SLP SHORT TERM GOAL #1   Title  pt will complete HEP for dysarthria and dysphagia with occasional min A over three sessions    Time  4    Status  New      SLP SHORT TERM GOAL #2   Title  pt will follow swallow precautions with POs during session with rare min A over two sessions  Time  4    Period  Weeks    Status  New      SLP SHORT TERM GOAL #3   Title  pt/family will acknowledge knowledge of 3 overt s/s aspiration PNA    Time  4    Period  Weeks    Status  New      SLP SHORT TERM GOAL #4   Title  pt will generate loud /a/ at average low-mid 80s over 3 sessions    Time  4    Period  Weeks    Status  New       SLP Long Term Goals - 03/10/17 2359      SLP LONG TERM GOAL #1   Title  pt will demo swallow and oral-motor HEP with rare min A over two sessions    Time  8    Period  Weeks    Status  New      SLP LONG TERM GOAL #2   Title  pt will complete loud /a/ with average low to mid 80s dB over 5 sessions    Time  8    Period  Weeks    Status  New      SLP LONG TERM GOAL #3   Title  pt will use average speech loudness of low 70s dB in 5 minutes simple conversation over two sessions    Time  8    Period  Weeks    Status  New       Plan - 03/10/17 2352    Clinical Impression Statement  Pt presents with swallowing deficits as measured by MBSS in January at [facility], adn by bedside clinical assessment in February at [facility]. Pt spoke today at lower than WNL level and would benefit from skilled ST to improve communication. Further, pt also with premorbid memory deficits, adn likely other cognitive linguistic deficits which may affect his ability to complete HEP for swallowing and his ability to recall compensations for POs and for intelligible speech. Daughter has agreed to help pt with HEP as well as following swallowing/aspiration precautions. He would benefit from skilled ST to address swallowing and dysarthria for safety with POs as well as ease of  communication.     Speech Therapy Frequency  2x / week    Duration  -- 8 weeks - 17 total sessions    Treatment/Interventions  Aspiration precaution training;Pharyngeal strengthening exercises;Diet toleration management by SLP;Internal/external aids;Patient/family education;Compensatory strategies;SLP instruction and feedback;Oral motor exercises;Cueing hierarchy;Environmental controls;Cognitive reorganization    Potential to Achieve Goals  Fair    Potential Considerations  Previous level of function;Ability to learn/carryover information    SLP Home Exercise Plan  SLP provided pt with effortful swallow today but has full HEP for dysphagia for pt including lip strengthening for dysarthria    Consulted and Agree with Plan of Care  Patient;Family member/caregiver    Family Member Consulted  daughter       Patient will benefit from skilled therapeutic intervention in order to improve the following deficits and impairments:   Dysphagia, oropharyngeal  Dysarthria and anarthria  Cognitive communication deficit    Problem List Patient Active Problem List   Diagnosis Date Noted  . Right sided weakness 03/01/2017  . Cigarette smoker 06/16/2016  . Dyspnea on exertion 06/15/2016  . Dementia arising in the senium and presenium 01/07/2014  . CSA (central sleep apnea) 10/05/2013  . Syncope 06/26/2013  . COPD GOLD II 06/25/2013  . BPH (benign prostatic  hyperplasia) 05/18/2013  . Glaucoma 05/18/2013  . S/P right TH revision 05/14/2013  . Constipation 03/26/2013  . Osteoporosis 03/26/2013  . Expected blood loss anemia 03/23/2013  . S/P right THA, AA 03/20/2013  . Hyperlipidemia   . Erectile dysfunction   . History of asbestos exposure   . Nocturia   . History of shingles   . Dermatitis, atopic   . OA (osteoarthritis) of hip   . Arthritis   . Frequency of urination   . Lumbar scoliosis   . Malignant neoplasm of prostate ([place]) 06/07/2011  . 84 year old gentleman with stage T3  adenocarcinoma prostate with Gleason score 4+4 and PSA of 10.3 05/11/2011    [facility] ,MS, CCC-SLP  03/11/2017, 12:08 AM  [facility] [address] [place], [place], [address] Phone: [phone]   Fax:  [phone]  Name: Derek Carr MRN: [id number] Date of Birth: December 05, 1931

## 2017-03-14 ENCOUNTER — Other Ambulatory Visit: Payer: Self-pay

## 2017-03-14 ENCOUNTER — Ambulatory Visit: Payer: Medicare HMO | Admitting: Occupational Therapy

## 2017-03-14 ENCOUNTER — Ambulatory Visit: Payer: Medicare HMO | Admitting: Physical Therapy

## 2017-03-14 DIAGNOSIS — R278 Other lack of coordination: Secondary | ICD-10-CM

## 2017-03-14 DIAGNOSIS — R471 Dysarthria and anarthria: Secondary | ICD-10-CM | POA: Diagnosis not present

## 2017-03-14 DIAGNOSIS — I69815 Cognitive social or emotional deficit following other cerebrovascular disease: Secondary | ICD-10-CM | POA: Diagnosis not present

## 2017-03-14 DIAGNOSIS — R2689 Other abnormalities of gait and mobility: Secondary | ICD-10-CM

## 2017-03-14 DIAGNOSIS — R41842 Visuospatial deficit: Secondary | ICD-10-CM | POA: Diagnosis not present

## 2017-03-14 DIAGNOSIS — R41841 Cognitive communication deficit: Secondary | ICD-10-CM | POA: Diagnosis not present

## 2017-03-14 DIAGNOSIS — R2681 Unsteadiness on feet: Secondary | ICD-10-CM

## 2017-03-14 DIAGNOSIS — R42 Dizziness and giddiness: Secondary | ICD-10-CM | POA: Diagnosis not present

## 2017-03-14 DIAGNOSIS — M6281 Muscle weakness (generalized): Secondary | ICD-10-CM | POA: Diagnosis not present

## 2017-03-14 DIAGNOSIS — R1312 Dysphagia, oropharyngeal phase: Secondary | ICD-10-CM | POA: Diagnosis not present

## 2017-03-14 NOTE — Therapy (Signed)
[facility] [address] [place], [place], [address] Phone: [phone]   Fax:  [phone]  Occupational Therapy Evaluation  Patient Details  Name: Derek Carr MRN: [id number] Date of Birth: [birth year] Referring Provider: Dr [name]   Encounter Date: 03/14/2017  OT End of Session - 03/14/17 1234    Visit Number  1    Number of Visits  17    Authorization Type  Need to verify coverage- insurance office closed today - holiday    OT Start Time  1030    OT Stop Time  1125    OT Time Calculation (min)  55 min    Activity Tolerance  Patient tolerated treatment well    Behavior During Therapy  [facility] for tasks assessed/performed       Past Medical History:  Diagnosis Date  . Anemia   . Atherosclerosis   . Complication of anesthesia    CONFUSION - Pt's family very concerned about this  . DDD (degenerative disc disease)   . Degenerative arthritis   . Dermatitis, atopic ARMS AND LEGS  . Diverticulosis   . Erectile dysfunction   . Frequency of urination   . Glaucoma   . History of asbestos exposure   . History of radiation therapy 8/19-13-10/18/11   prostate, 41 GY  . History of shingles 2012-- BILATERAL EYES--  NO RESIDUAL  . Hyperlipidemia   . Hypertension   . Inguinal hernia   . Lumbar scoliosis   . Nocturia   . OA (osteoarthritis) of hip RIGHT  . Prostate cancer ([place]) 05/11/2011   bx=Adenocarcinoma,gleason3+4=7, 4+4=8,PSA=10.30volume=45.7cc  . Renal cyst    bilateral  . Smokers' cough ([place])   . Thyroid cyst     Past Surgical History:  Procedure Laterality Date  . ATTEMPTED LEFT VATS/ LEFT THORACOTOMY/ RESECTION OF THE ENORMOUS, PROBABLE BRONCHOGENIC CYST  01-04-2005  DR Arlyce Dice  . CARDIAC CATHETERIZATION  02-24-2009  DR NASHER   MINOR CORONARY ARTERY IRREGULARITIES/ NORMAL LVSF/ EF 65-70%  . CATARACT EXTRACTION    . COLONOSCOPY W/ POLYPECTOMY    . CYSTOSCOPY  11/15/2011   Procedure: CYSTOSCOPY;   Surgeon: Dutch Gray, MD;  Location: [facility];  Service: Urology;  Laterality: N/A;  no seeds seen in bladder  . esophageus cyst removal  YRS AGO  . [place]  . PROSTATE BIOPSY  05/11/11   Adenocarcinoma ([facility] OFFICE)  . RADIOACTIVE SEED IMPLANT  11/15/2011   Procedure: RADIOACTIVE SEED IMPLANT;  Surgeon: Dutch Gray, MD;  Location: [facility];  Service: Urology;  Laterality: N/A;  Total number of seeds - 52  . REPAIR RIGHT INGUINAL HERNIA W/ MESH  09-10-1999  . TOTAL HIP ARTHROPLASTY Right 03/20/2013   Procedure: RIGHT TOTAL HIP ARTHROPLASTY ANTERIOR APPROACH;  Surgeon: [name], MD;  Location: [facility];  Service: Orthopedics;  Laterality: Right;  . TOTAL HIP REVISION Right 05/14/2013   Procedure: open reduction internal fixation REVISION RIGHT HIP ;  Surgeon: [name], MD;  Location: [facility];  Service: Orthopedics;  Laterality: Right;  . UMBILICAL HERNIA REPAIR  1998   epigastric    There were no vitals filed for this visit.  Subjective Assessment - 03/14/17 1211    Subjective   I don't do anything fast.  I am afraid of falling- I take my cane whenever I walk.      Patient is accompained by:  Family member Daughter, [name]    Pertinent History  TIA'S, most recent 02/2017, 3 year history of dizziness, decreased baalnce, orthostatic hypotension, recent cataract extraction - 2 weeks ago    Currently in Pain?  No/denies    Pain Score  0-No pain        [facility] OT Assessment - 03/14/17 1028      Assessment   Medical Diagnosis  TIA     Referring Provider  Dr [name]    Onset Date/Surgical Date  03/03/17    Hand Dominance  Right    Prior Therapy  PT, Vestibular therapy at ENT practice in [place]      Precautions   Precautions  Fall    Precaution Comments  Patient cautious- using cane      Restrictions   Weight Bearing Restrictions  No      Balance Screen   Has the patient fallen in the past 6 months  Yes    Has the  patient had a decrease in activity level because of a fear of falling?   Yes    Is the patient reluctant to leave their home because of a fear of falling?   No      Home  Environment   Lives With  Spouse wife has dementia- although high functioning per daughter      Prior Function   Level of Independence  Independent with basic ADLs;Independent with household mobility without device;Independent with community mobility with device;Independent with homemaking with ambulation prior to 70    Vocation  Retired    Leisure  Working on cars, exercise- now because of balance, dizziness and vision - less active      ADL   Eating/Feeding  Independent    Grooming  Independent    Upper Body Bathing  Modified independent    Lower Body Bathing  Modified independent    Upper Body Dressing  Minimal assistance    Lower Body Dressing  Increased time    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, theatre/television/film      IADL   Prior Level of Function Shopping  independent    Shopping  Completely unable to shop    Prior Level of [place]  Needs help with all home maintenance tasks    Prior Level of Function Meal Prep  independent    Meal Prep  Able to complete simple cold meal and snack prep      Written Expression   Dominant Hand  Right    Handwriting  100% legible      Vision - History   Baseline Vision  Wears glasses all the time    Visual History  Cataracts    Patient Visual Report  -- and glaucoma- both eyes    Additional Comments  Recent surgery for cataract right eye - 2 weeks ago.        Vision Assessment   Eye Alignment  Impaired (comment)    Ocular Range of Motion  Impaired to be futher tested in functional context    Tracking/Visual Pursuits  Decreased smoothness of horizontal  tracking    Saccades  Additional eye shifts occurred during testing    Acuity  Impaired - to be further tested in functional context Patient still receiving eye drops after recent surgery  Depth Perception  Impaired    Comment  Patient unable to focus with current glasses      Activity Tolerance   Activity Tolerance  Endurance does not limit participation in activity during evaluation      Cognition   Overall Cognitive Status  Impaired/Different from baseline    Area of Impairment  Memory;Awareness    Memory  Decreased short-term memory    Memory Comments  More challenged with confrontation    Awareness  Emergent    Memory  Impaired      Observation/Other Assessments   Observations  skilled clinical judgement    Focus on Therapeutic Outcomes (FOTO)   NA    Outcome Measures  9 hole peg, dynamometer      Posture/Postural Control   Posture/Postural Control  Postural limitations    Posture Comments  Patient with significantly limited dynamic stand balance which impedes speed and effectiveness with even simple self care tasks, e.g. donning a jacket.        Sensation   Light Touch  Impaired by gross assessment    Stereognosis  Not tested    Hot/Cold  Not tested    Proprioception  Not tested      Coordination   Gross Motor Movements are Fluid and Coordinated  Yes    Fine Motor Movements are Fluid and Coordinated  No    9 Hole Peg Test  Right;Left    Right 9 Hole Peg Test  50 sec    Left 9 Hole Peg Test  47 sec      Perception   Perception  Within Functional Limits      Praxis   Praxis  Intact      Tone   Assessment Location  Right Upper Extremity;Left Upper Extremity      ROM / Strength   AROM / PROM / Strength  AROM;Strength      AROM   Overall AROM   Deficits    Overall AROM Comments  Patient with OA- mild restriction in right overhead reach as compared to left.       Strength   Overall Strength  Within functional limits for tasks performed    Overall Strength  Comments  4/5 throughout upper extremities      Hand Function   Right Hand Gross Grasp  Functional    Right Hand Grip (lbs)  75    Right Hand Lateral Pinch  23 lbs    Left Hand Gross Grasp  Functional    Left Hand Grip (lbs)  76    Left Hand Lateral Pinch  21 lbs      RUE Tone   RUE Tone  Within Functional Limits      LUE Tone   LUE Tone  Within Functional Limits                      OT Education - 03/14/17 1234    Education provided  Yes    Education Details  Evaluation findings and potential goals for OT    Person(s) Educated  Patient;Child(ren)    Methods  Explanation    Comprehension  Verbalized understanding       OT Short Term Goals - 03/14/17 1317      OT SHORT TERM GOAL #1   Title  Patient will don front opening shirt / jacket in standing independently (due 3/20)    Time  4    Period  Weeks    Status  New      OT SHORT TERM GOAL #2   Title  Patient will demonstrate reduced time on 9 hole peg test by 5 seconds to improve speed with daily dressing tasks.    Baseline  Results:  right - 50 sec, left 47 sec on 03/14/17    Time  4    Period  Weeks      OT SHORT TERM GOAL #3   Title  Patient will use stovetop to cook simple meal item- e.g. fry an egg with close supervision, verbal cueing.      Time  4    Period  Weeks    Status  New      OT SHORT TERM GOAL #4   Title  Patient will complete a home exercise program designed to improve coordiantion in his hands.      Time  4    Period  Weeks    Status  New      OT SHORT TERM GOAL #5   Title  Patient will utilize compensatory strategy to effectively utilize pill box for am and pm medications with supervision.      Time  4    Period  Weeks    Status  New        OT Long Term Goals - 03/14/17 1317      OT LONG TERM GOAL #1   Title  Patient will effectively use simple hand tools in standing (screw driver, wrench, hammer) with supervision in preparation for returning to shop (due 4/19)    Time   8    Period  Weeks    Status  New      OT LONG TERM GOAL #2   Title  Patient will cook a simple familiar hot  meal using stovetop and/or oven- at least two items without assistance    Time  8    Period  Weeks    Status  New      OT LONG TERM GOAL #3   Title  Patient will reduce speed on 9 hole peg test by 10 seconds to improve functional coordination during ADL/IADL- tool use.      Baseline  Initial score on 2/18- right 50 seconds, left 47 seconds    Time  8    Period  Weeks    Status  New      OT LONG TERM GOAL #4   Title  Patient will demonstrate adequate environmental visual scanning and functional mobility to safely navigate through crowded hallway, or busy retail store with supervision.      Time  8    Period  Weeks    Status  New      OT LONG TERM GOAL #5   Title  Patient will return to community exercise program with assistance from family member    Time  8    Period  Weeks    Status  New            Plan - 03/14/17 1237    Clinical Impression Statement  Patient is an 84 year old man, who has incurred several TIA's and has suffered a significant decline in function over the past few years.  Prior to 2015, patient was completely independent with ADL/IADL, but he is now limited due to decreased balance, dizziness, decreased visual motor skills, low vision challenges (cataracts and glaucoma), decreased right lower extremity strength, numbness in feet,and fear of falling.  Patient with medical history significant for OA,  Prostate CA, COPD, SLeep apnea, Dementia, cataracts, glaucoma, R THA, Lumbar scoliosis syncope and nocturia.  Patient will benefit from skilled OT intervention to improve his ability to return to IADL and leisure interests he once enjoyed.       Occupational Profile and client history currently impacting functional performance  Patient is married - wife has dementia, father - 4 children live nearby, retired Cabin crew, used to exercise at least 3 days/week  - walking class, and water exercises, patient enjoys outdoor work, and working on cars in his shop.      Occupational performance deficits (Please refer to evaluation for details):  ADL's;IADL's;Leisure;Social Participation    Rehab Potential  Good    OT Frequency  2x / week    OT Duration  8 weeks    OT Treatment/Interventions  Self-care/ADL training;Energy conservation;Visual/perceptual remediation/compensation;Aquatic Therapy;DME and/or AE instruction;Patient/family education;Balance training;Functional Furniture conservator/restorer;Therapeutic exercise;Therapeutic activities;Neuromuscular education    Clinical Decision Making  Limited treatment options, no task modification necessary    Consulted and Agree with Plan of Care  Patient;Family member/caregiver    Family Member Consulted  daughter [name]       Patient will benefit from skilled therapeutic intervention in order to improve the following deficits and impairments:  Impaired vision/preception, Improper body mechanics, Impaired perceived functional ability, Decreased activity tolerance, Decreased knowledge of use of DME, Decreased strength, Impaired sensation, Decreased mobility, Decreased balance, Decreased cognition, Decreased safety awareness  Visit Diagnosis: Unsteadiness on feet - Plan: Ot plan of care cert/re-cert  Muscle weakness (generalized) - Plan: Ot plan of care cert/re-cert  Cognitive social or emotional deficit following other cerebrovascular disease - Plan: Ot plan of care cert/re-cert  Visuospatial deficit - Plan: Ot plan of care cert/re-cert  Other lack of coordination - Plan: Ot plan of care cert/re-cert    Problem List Patient Active Problem List   Diagnosis Date Noted  . Right sided weakness 03/01/2017  . Cigarette smoker 06/16/2016  . Dyspnea on exertion 06/15/2016  . Dementia arising in the senium and presenium 01/07/2014  . CSA (central sleep apnea) 10/05/2013  . Syncope 06/26/2013  . COPD GOLD II 06/25/2013   . BPH (benign prostatic hyperplasia) 05/18/2013  . Glaucoma 05/18/2013  . S/P right TH revision 05/14/2013  . Constipation 03/26/2013  . Osteoporosis 03/26/2013  . Expected blood loss anemia 03/23/2013  . S/P right THA, AA 03/20/2013  . Hyperlipidemia   . Erectile dysfunction   . History of asbestos exposure   . Nocturia   . History of shingles   . Dermatitis, atopic   . OA (osteoarthritis) of hip   . Arthritis   . Frequency of urination   . Lumbar scoliosis   . Malignant neoplasm of prostate ([place]) 06/07/2011  . 84 year old gentleman with stage T3 adenocarcinoma prostate with Gleason score 4+4 and PSA of 10.3 05/11/2011    Mariah Milling, OTR/L 03/14/2017, 1:31 PM  [facility] [address] [place], [place], [address] Phone: [phone]   Fax:  [phone]  Name: Derek Carr MRN: [id number] Date of Birth: 03/17/1931

## 2017-03-14 NOTE — Therapy (Signed)
[facility] [address] [place], [place], [address] Phone: [phone]   Fax:  [phone]  Physical Therapy Evaluation  Patient Details  Name: Derek Carr MRN: [id number] Date of Birth: 09-13-1931 Referring Provider: Dr Derek Carr   Encounter Date: 03/14/2017  PT End of Session - 03/14/17 1240    Visit Number  1    Number of Visits  17    Date for PT Re-Evaluation  05/13/17    Authorization Type  [employer] Medicare & Generic commercial 2nd    PT Start Time  0930    PT Stop Time  1015    PT Time Calculation (min)  45 min    Equipment Utilized During Treatment  Gait belt    Activity Tolerance  Patient limited by fatigue;Patient tolerated treatment well    Behavior During Therapy  [facility] for tasks assessed/performed       Past Medical History:  Diagnosis Date  . Anemia   . Atherosclerosis   . Complication of anesthesia    CONFUSION - Pt's family very concerned about this  . DDD (degenerative disc disease)   . Degenerative arthritis   . Dermatitis, atopic ARMS AND LEGS  . Diverticulosis   . Erectile dysfunction   . Frequency of urination   . Glaucoma   . History of asbestos exposure   . History of radiation therapy 8/19-13-10/18/11   prostate, 45 GY  . History of shingles 2012-- BILATERAL EYES--  NO RESIDUAL  . Hyperlipidemia   . Hypertension   . Inguinal hernia   . Lumbar scoliosis   . Nocturia   . OA (osteoarthritis) of hip RIGHT  . Prostate cancer (HCC) 05/11/2011   bx=Adenocarcinoma,gleason3+4=7, 4+4=8,PSA=10.30volume=45.7cc  . Renal cyst    bilateral  . Smokers' cough (HCC)   . Thyroid cyst     Past Surgical History:  Procedure Laterality Date  . ATTEMPTED LEFT VATS/ LEFT THORACOTOMY/ RESECTION OF THE ENORMOUS, PROBABLE BRONCHOGENIC CYST  01-04-2005  Derek Derek Carr  . CARDIAC CATHETERIZATION  02-24-2009  Derek Derek Carr   MINOR CORONARY ARTERY IRREGULARITIES/ NORMAL LVSF/ EF 65-70%  . CATARACT EXTRACTION     . COLONOSCOPY W/ POLYPECTOMY    . CYSTOSCOPY  11/15/2011   Procedure: CYSTOSCOPY;  Surgeon: Derek Carr, Carr;  Location: [facility];  Service: Urology;  Laterality: N/A;  no seeds seen in bladder  . esophageus cyst removal  YRS AGO  . LUMBAR SPINE SURGERY  1991  . PROSTATE BIOPSY  05/11/11   Adenocarcinoma ([facility] OFFICE)  . RADIOACTIVE SEED IMPLANT  11/15/2011   Procedure: RADIOACTIVE SEED IMPLANT;  Surgeon: Derek Carr, Carr;  Location: [facility];  Service: Urology;  Laterality: N/A;  Total number of seeds - 52  . REPAIR RIGHT INGUINAL HERNIA W/ MESH  09-10-1999  . TOTAL HIP ARTHROPLASTY Right 03/20/2013   Procedure: RIGHT TOTAL HIP ARTHROPLASTY ANTERIOR APPROACH;  Surgeon: Derek Pal, Carr;  Location: [facility];  Service: Orthopedics;  Laterality: Right;  . TOTAL HIP REVISION Right 05/14/2013   Procedure: open reduction internal fixation REVISION RIGHT HIP ;  Surgeon: Derek Pal, Carr;  Location: [facility];  Service: Orthopedics;  Laterality: Right;  . UMBILICAL HERNIA REPAIR  1998   epigastric    Vitals:   03/14/17 1015  SpO2: 97%     Subjective Assessment - 03/14/17 0940    Subjective  Patient is an 84 y/o M who has been referred to OPPT by Derek Dodrill Carr His  PCP is Derek Carr. He was hospitalized 02/05-02/07/2017 with right side weakness & facial droop and diagnosed Transient Ischemic attack. Imaging was negative for acute stroke. Patient?Ts daughter reports patient had 4 past TIAs first of which was found incidental on imaging , 2nd in 2017 , 3rd was in 2018, and his 4th was on02/05/2017.  Patient family reports last TIA has not had significant impact on his overall mobility as a single event however  there has been a progressive reduction in pt's mobility and strength over the last 2 years. Pt and family concerned about progression of symptoms. Pt reports he is most concerned about is balance. He has also reported that he has been dealing with a possible (  daughter was not positive) diagnosis of vestibular neuritis. Although, he reports no falls in last 6 months he reports reducing his activity secondary to fear of falling. Pt has had many near falls in the past. Patient also reports a history of continuous dizziness at intermittent times during the day. Patient reported dizziness on sit to stand during evaluation. Pt reports that last week he had a cataract operation on his eyes.    Patient is accompained by:  Family member daughter    Pertinent History  TIA, DDD, Lumbar scoliosis, Glaucoma, cataract surgery, HTN, right THR 03/20/13 with revision 05/14/13, right femur fracture    Limitations  Standing;Walking;House hold activities    Patient Stated Goals  Patient wants to improve his balance & walking    Currently in Pain?  No/denies         [facility] PT Assessment - 03/14/17 0930      Assessment   Medical Diagnosis  TIA     Referring Provider  Derek Carr Derek Dodrill Carr, hospitalist Derek Carr Carr    Onset Date/Surgical Date  03/03/17 referral date    Hand Dominance  Right    Prior Therapy  Vestibular PT at [facility] in [facility]      Precautions   Precautions  Fall      Restrictions   Weight Bearing Restrictions  No      Balance Screen   Has the patient fallen in the past 6 months  No    Has the patient had a decrease in activity level because of a fear of falling?   Yes    Is the patient reluctant to leave their home because of a fear of falling?   Yes      Home Environment   Living Environment  Private residence    Living Arrangements  Spouse/significant other    Available Help at Discharge  Family    Type of Home  House grass walkway to get to back door    Home Access  Stairs to enter    Entrance Stairs-Number of Steps  4  2 steps to get in the back door without rails    Entrance Stairs-Rails  Left;Right;Can reach both    Home Layout  One level    Home Equipment  [place] - single point;Walker - 2 wheels;Shower  seat;Bedside commode;Wheelchair - manual;Grab bars - tub/shower Walkway bar for balance when walking in hallway    Additional Comments  ramp to enter bed room       Prior Function   Level of Independence  Independent with household mobility without device;Independent with community mobility with device    Vocation  Retired    Leisure  Working on cars      Observation/Other Assessments  Observations  mild R face drop      Tone   Assessment Location  Right Lower Extremity;Left Lower Extremity      ROM / Strength   AROM / PROM / Strength  Strength      Strength   Overall Strength  Deficits    Strength Assessment Site  Knee;Hip;Ankle    Right/Left Hip  Left;Right    Right Hip Flexion  4+/5    Right Hip Extension  4-/5    Right Hip ABduction  4-/5    Left Hip Flexion  4+/5    Right/Left Knee  Left;Right    Right Knee Flexion  4-/5    Right Knee Extension  4/5    Left Knee Flexion  4+/5    Right/Left Ankle  Left;Right    Right Ankle Dorsiflexion  4/5    Left Ankle Dorsiflexion  5/5      Transfers   Transfers  Stand to Sit;Sit to Stand    Sit to Stand  5: Supervision;With upper extremity assist;With armrests;From chair/3-in-1    Sit to Stand Details (indicate cue type and reason)  Pt unable/ un willing to try sit to stand with out arm rest    Stand to Sit  5: Supervision;To chair/3-in-1;With armrests;With upper extremity assist      Ambulation/Gait   Ambulation/Gait  Yes    Ambulation/Gait Assistance  4: Min assist    Ambulation/Gait Assistance Details  slow, cautious gait     Ambulation Distance (Feet)  85 Feet    Assistive device  Straight cane    Gait Pattern  Step-through pattern;Decreased arm swing - right;Decreased step length - right;Decreased step length - left;Decreased stride length;Decreased dorsiflexion - right;Trunk flexed;Poor foot clearance - right;Poor foot clearance - left;Decreased stance time - right;Decreased hip/knee flexion - right    Ambulation Surface   Level;Indoor    Gait velocity  2.57ft/sec      Balance   Balance Assessed  Yes      Standardized Balance Assessment   Standardized Balance Assessment  Berg Balance Test;Timed Up and Go Test      Berg Balance Test   Sit to Stand  Able to stand  independently using hands    Standing Unsupported  Able to stand 2 minutes with supervision    Sitting with Back Unsupported but Feet Supported on Floor or Stool  Able to sit safely and securely 2 minutes    Stand to Sit  Controls descent by using hands    Transfers  Able to transfer safely, definite need of hands    Standing Unsupported with Eyes Closed  Needs help to keep from falling    Standing Ubsupported with Feet Together  Needs help to attain position and unable to hold for 15 seconds    From Standing, Reach Forward with Outstretched Arm  Can reach forward >5 cm safely (2")    From Standing Position, Pick up Object from Floor  Able to pick up shoe, needs supervision    From Standing Position, Turn to Look Behind Over each Shoulder  Needs supervision when turning    Turn 360 Degrees  Able to turn 360 degrees safely but slowly    Standing Unsupported, Alternately Place Feet on Step/Stool  Needs assistance to keep from falling or unable to try pt unable to cleat object and had LOB corrected with MOD A.     Standing Unsupported, One Foot in [employer] balance while stepping or standing  Standing on One Leg  Unable to try or needs assist to prevent fall    Total Score  24    Berg comment:  Pt required multiple seated rest breaks during Berg       Timed Up and Go Test   TUG  Normal TUG    Normal TUG (seconds)  21.72 with Cane    TUG Comments  19.75 without cane Unsteady gait requires Minimal assist during turn for safety      RLE Tone   RLE Tone  Within Functional Limits      LLE Tone   LLE Tone  Within Functional Limits             Objective measurements completed on examination: See above findings.               PT Education - 03/14/17 1019    Education provided  Yes    Education Details  PT POC, falls prevention, AD use and education,     Person(s) Educated  Patient;Child(ren)    Methods  Explanation;Demonstration;Handout    Comprehension  Verbalized understanding       PT Short Term Goals - 03/14/17 1250      PT SHORT TERM GOAL #1   Title  Pt will be independent with initial HEP exercises (All STGs Target Date 04/13/2017)    Time  1    Period  Months    Status  New    Target Date  04/13/17      PT SHORT TERM GOAL #2   Title  Pt will demonstrate ramp & Curb navigation Minimal Guard to improve confidence and safety ambulating in home and community environment     Time  1    Period  Months    Status  New    Target Date  04/13/17      PT SHORT TERM GOAL #3   Title  Patient ambuates with LRAD scanning environment with no balance loss.     Time  1    Period  Months    Status  New    Target Date  04/13/17      PT SHORT TERM GOAL #4   Title  Berg Balance >/= 30/56    Time  1    Period  Months    Status  New    Target Date  04/13/17      PT SHORT TERM GOAL #5   Title  Patient ambulates 200' with LRAD with supervision.     Time  1    Period  Months    Status  New    Target Date  04/13/17        PT Long Term Goals - 03/14/17 1254      PT LONG TERM GOAL #1   Title  Pt will demonstrate independence with ongoing HEP program and fitness plan (All LTGs Target Date 05/13/2017)    Time  2    Period  Months    Status  New    Target Date  05/13/17      PT LONG TERM GOAL #2   Title  Pt will demonstrate Mod I 500' ambulation outdoors including grass, pavement, curbs, ramps with LRAD to improve community ambulatory skills    Time  2    Period  Months    Status  New    Target Date  05/13/17      PT LONG TERM GOAL #3   Title  Pt will  demonstrate Berg >/= 36/56 in order to reduce falls risk & indicate less dependency in ADLs.    Baseline  Berg 24/56  (03/14/17)    Time  2    Period  Months    Status  New    Target Date  05/13/17      PT LONG TERM GOAL #4   Title  Patient performs Timed Up-Go time with cane <17 seconds to indicate lower fall risk.     Baseline  With cane 21.72 without cane 19.75(2/18)    Time  2    Period  Months    Status  New    Target Date  05/13/17      PT LONG TERM GOAL #5   Title  Pt will negotiate 2 stairs without Rail assist with LRAD with family supervision to improve safety when entering/ exiting home.     Time  2    Period  Months    Status  New    Target Date  05/13/17             Plan - 03/14/17 1242    Clinical Impression Statement  Mr. Laudon is a 84 y/o male with recent TIA (4th in last 3 years with a reported slow progression of impairment). Patient presents with hard of hearing and with a moderate dysarthria. Patient demonstrated a Berg balance score of 24/56 which indicates high fall risk & dependency in ADLs.  Patient Timed up go test with a cane in 21.72 seconds and without a cane in 19.75 seconds (demonstrating unsteadiness on feet and en bloc turning pattern). Patient also demonstrated a community ambulatory level gait speed of 2.61ft/sec with his cane and unsteady gait with minimal assist with balance losses. These findings and patients report of a fear of falling, and multiple near falls that has kept him in his home, places him at a high falls risk. During strength assessment Patient demonstrated significant RLE weakness compared bilaterally( R>L). Based on these PT evaluation findings and patient presentation, patient stands to significantly benefit from PT intervention to reduce falls risk and improve functional strength in order to prevent further injury associated with balance disturbance and reductions in functional mobility.      History and Personal Factors relevant to plan of care:  Lives with elderly wife, TIA, difficulty walking and speach ,dementia arthrosclerosis, [place],  cataracts, Hyperlipidemia, HTN, R THR( revision 05/14/13), right femur fracture, Prostate cancer(04/2011), Dyspnea on exertion, and COPD    Clinical Presentation  Evolving    Clinical Presentation due to:  increased in frequency of TIA and slow progression of impairment.     Clinical Decision Making  Moderate    Rehab Potential  Good    Clinical Impairments Affecting Rehab Potential  progressive decline over 2 years, good attitude and family support with 4 children in area.     PT Frequency  2x / week    PT Duration  Other (comment) 9 weeks    PT Treatment/Interventions  Gait training;Functional mobility training;Neuromuscular re-education;Balance training;Therapeutic exercise;Therapeutic activities;Patient/family education;ADLs/Self Care Home Management;Stair training;Orthotic Fit/Training;Manual techniques;Vestibular    PT Next Visit Plan  Orthostatic & vestibular assessment, development of initial HEP ( balance, scanning tasks, proximal hip strengthening)    Consulted and Agree with Plan of Care  Patient;Family member/caregiver    Family Member Consulted  daughter Zella Ball)       Patient will benefit from skilled therapeutic intervention in order to improve the following deficits and impairments:  Abnormal gait,  Decreased balance, Decreased endurance, Decreased mobility, Difficulty walking, Decreased activity tolerance, Decreased strength, Decreased safety awareness  Visit Diagnosis: Muscle weakness (generalized)  Other abnormalities of gait and mobility  Unsteadiness on feet  Dizziness and giddiness     Problem List Patient Active Problem List   Diagnosis Date Noted  . Right sided weakness 03/01/2017  . Cigarette smoker 06/16/2016  . Dyspnea on exertion 06/15/2016  . Dementia arising in the senium and presenium 01/07/2014  . CSA (central sleep apnea) 10/05/2013  . Syncope 06/26/2013  . COPD GOLD II 06/25/2013  . BPH (benign prostatic hyperplasia) 05/18/2013  . Glaucoma  05/18/2013  . S/P right TH revision 05/14/2013  . Constipation 03/26/2013  . Osteoporosis 03/26/2013  . Expected blood loss anemia 03/23/2013  . S/P right THA, AA 03/20/2013  . Hyperlipidemia   . Erectile dysfunction   . History of asbestos exposure   . Nocturia   . History of shingles   . Dermatitis, atopic   . OA (osteoarthritis) of hip   . Arthritis   . Frequency of urination   . Lumbar scoliosis   . Malignant neoplasm of prostate (HCC) 06/07/2011  . 84 year old gentleman with stage T3 adenocarcinoma prostate with Gleason score 4+4 and PSA of 10.3 05/11/2011   Merton Border, SPT 03/14/2017, 1:10 pm  Vladimir Faster PT, DPT 03/14/2017, 10:12 PM  [facility] [address] [place], [place], [address] Phone: [phone]   Fax:  [phone]  Name: Derek Carr MRN: [id number] Date of Birth: 04/04/1931

## 2017-03-15 ENCOUNTER — Ambulatory Visit: Payer: Medicare HMO

## 2017-03-15 DIAGNOSIS — I672 Cerebral atherosclerosis: Secondary | ICD-10-CM | POA: Diagnosis not present

## 2017-03-15 DIAGNOSIS — Z6824 Body mass index (BMI) 24.0-24.9, adult: Secondary | ICD-10-CM | POA: Diagnosis not present

## 2017-03-15 DIAGNOSIS — G458 Other transient cerebral ischemic attacks and related syndromes: Secondary | ICD-10-CM | POA: Diagnosis not present

## 2017-03-15 DIAGNOSIS — R1319 Other dysphagia: Secondary | ICD-10-CM | POA: Diagnosis not present

## 2017-03-15 DIAGNOSIS — I10 Essential (primary) hypertension: Secondary | ICD-10-CM | POA: Diagnosis not present

## 2017-03-17 ENCOUNTER — Ambulatory Visit: Payer: Medicare HMO

## 2017-03-21 ENCOUNTER — Encounter: Payer: Self-pay | Admitting: Neurology

## 2017-03-21 ENCOUNTER — Ambulatory Visit: Payer: Medicare HMO | Admitting: Neurology

## 2017-03-21 VITALS — BP 117/76 | HR 66 | Ht 75.0 in | Wt 183.0 lb

## 2017-03-21 DIAGNOSIS — G6289 Other specified polyneuropathies: Secondary | ICD-10-CM

## 2017-03-21 DIAGNOSIS — G459 Transient cerebral ischemic attack, unspecified: Secondary | ICD-10-CM

## 2017-03-21 MED ORDER — DONEPEZIL HCL 10 MG PO TABS: 10.0000 mg | ORAL_TABLET | Freq: Every day | ORAL | 3 refills | Status: DC

## 2017-03-21 NOTE — Patient Instructions (Signed)
I had a long discussion with the patient and her his daughter regarding his recent TIA, pre-existing lymphadenopathy as well as cognitive issues which likely suggests underlying dementia. I recommend the patient continue aspirin but stopped Plavix after 3 days as a do not see any long-term benefit of dual antiplatelet therapy particularly since patient is a fall risk. Continue strict control of hypertension with blood pressure goal below 130/90, lipids with LDL cholesterol goal below 70 mg percent. Trial of Aricept 5 mg daily for a month to be increased to 10 mg daily to help with his dementia. Check EEG for any subclinical seizures. Patient was counseled to use a cane at all times to avoid falling. Return for follow-up with Jessica minus practitioner in 3 months. He was also advised to follow-up with Dr. Brett Fairy for ongoing treatment for his sleep apnea with CPAP.   Fall Prevention in the Home Falls can cause injuries. They can happen to people of all ages. There are many things you can do to make your home safe and to help prevent falls. What can I do on the outside of my home?  Regularly fix the edges of walkways and driveways and fix any cracks.  Remove anything that might make you trip as you walk through a door, such as a raised step or threshold.  Trim any bushes or trees on the path to your home.  Use bright outdoor lighting.  Clear any walking paths of anything that might make someone trip, such as rocks or tools.  Regularly check to see if handrails are loose or broken. Make sure that both sides of any steps have handrails.  Any raised decks and porches should have guardrails on the edges.  Have any leaves, snow, or ice cleared regularly.  Use sand or salt on walking paths during winter.  Clean up any spills in your garage right away. This includes oil or grease spills. What can I do in the bathroom?  Use night lights.  Install grab bars by the toilet and in the tub and  shower. Do not use towel bars as grab bars.  Use non-skid mats or decals in the tub or shower.  If you need to sit down in the shower, use a plastic, non-slip stool.  Keep the floor dry. Clean up any water that spills on the floor as soon as it happens.  Remove soap buildup in the tub or shower regularly.  Attach bath mats securely with double-sided non-slip rug tape.  Do not have throw rugs and other things on the floor that can make you trip. What can I do in the bedroom?  Use night lights.  Make sure that you have a light by your bed that is easy to reach.  Do not use any sheets or blankets that are too big for your bed. They should not hang down onto the floor.  Have a firm chair that has side arms. You can use this for support while you get dressed.  Do not have throw rugs and other things on the floor that can make you trip. What can I do in the kitchen?  Clean up any spills right away.  Avoid walking on wet floors.  Keep items that you use a lot in easy-to-reach places.  If you need to reach something above you, use a strong step stool that has a grab bar.  Keep electrical cords out of the way.  Do not use floor polish or wax that makes floors slippery.  If you must use wax, use non-skid floor wax.  Do not have throw rugs and other things on the floor that can make you trip. What can I do with my stairs?  Do not leave any items on the stairs.  Make sure that there are handrails on both sides of the stairs and use them. Fix handrails that are broken or loose. Make sure that handrails are as long as the stairways.  Check any carpeting to make sure that it is firmly attached to the stairs. Fix any carpet that is loose or worn.  Avoid having throw rugs at the top or bottom of the stairs. If you do have throw rugs, attach them to the floor with carpet tape.  Make sure that you have a light switch at the top of the stairs and the bottom of the stairs. If you do not  have them, ask someone to add them for you. What else can I do to help prevent falls?  Wear shoes that: ? Do not have high heels. ? Have rubber bottoms. ? Are comfortable and fit you well. ? Are closed at the toe. Do not wear sandals.  If you use a stepladder: ? Make sure that it is fully opened. Do not climb a closed stepladder. ? Make sure that both sides of the stepladder are locked into place. ? Ask someone to hold it for you, if possible.  Clearly mark and make sure that you can see: ? Any grab bars or handrails. ? First and last steps. ? Where the edge of each step is.  Use tools that help you move around (mobility aids) if they are needed. These include: ? Canes. ? Walkers. ? Scooters. ? Crutches.  Turn on the lights when you go into a dark area. Replace any light bulbs as soon as they burn out.  Set up your furniture so you have a clear path. Avoid moving your furniture around.  If any of your floors are uneven, fix them.  If there are any pets around you, be aware of where they are.  Review your medicines with your doctor. Some medicines can make you feel dizzy. This can increase your chance of falling. Ask your doctor what other things that you can do to help prevent falls. This information is not intended to replace advice given to you by your health care provider. Make sure you discuss any questions you have with your health care provider. Document Released: 11/07/2008 Document Revised: 06/19/2015 Document Reviewed: 02/15/2014 Elsevier Interactive Patient Education  [employer].

## 2017-03-21 NOTE — Progress Notes (Signed)
[facility] [address] [place]. [place] [address] [phone]       OFFICE FOLLOW-UP NOTE  Derek Carr Date of Birth:  [birth year] Medical Record Number:  [id number]   HPI: Derek Carr is a 84 year old African-American gentleman who is accompanied today by his daughter. History is obtained from them as well as review of electronic medical records. I have personally reviewed imaging films.Derek Carr is an 84 y.o. male with hypertension and hyperlipidemia.  Per patient approximately 7:00 in the morning on 03/01/2017 patient went to get a right cataract surgery.  He did not stop his aspirin or Plavix for this per patient.  He then took a nap at approximately 1300 hrs. patient awoke at 6 PM and daughter called EMSfor right-sided weakness, facial droop, dysarthria.  Daughter states that he was confused but patient states that he was having a difficult time understanding him which would be more of a receptive aphasia.    By the time EMS arrived (approximately 10 minutes ) all these symptoms had resolved the exception of the right upper extremity weakness--which also quickly resolved.  Patient is noted to have a TIA back in December 2018 (as noted below ) and was placed on Plavix along with his aspirin by his primary care physician.  Patient was not seen in the hospital at that time.Per daughter patient has had one further event prior to this.  At that time they noticed that he had a left facial droop along with leaning to the left.  He did not have any dysarthria at that time.  The symptoms lasted very quickly for approximately 10 minutes. Date last known well: Date: 03/01/2017.Time last known well: Time: 13:00.tPA Given: No: Minimal symptoms.NIH stroke scale at this point is 0.Modified Rankin: Rankin Score=0 MRI scan of the brain showed no acute infarct. LDL cholesterol 50 mg percent. Albumin A1c was 6. Echocardiogram showed no cardiac source of embolism and cardiac ultrasound  was unremarkable. Patient's daughter described that for the past 2 years she had noticed slowing of patient's mentation, speech, ambulation and some swallowing difficulties. He had been evaluated in the past by ENT and by neurologist Dr. Jennings Books at [facility]. He had EMG nerve conduction study which showed sensorimotor neuropathy. Patient has not been started on dementia medications. Is currently living with his wife. He is independent but daughter has noticed cognitive decline recently. Patient ambulatory with a cane but his balance seems to be poor. He does also have sleep apnea but is having trouble using his CPAP mask. The daughter noticed excessive daytime sleepiness and he can fall asleep even in the middle of conversations. He has not seen Dr. Brett Fairy recently for these issues. The patient denies any significant weight loss, muscle fasciculations, muscle wasting or weakness. He is currently on dual antiplatelet therapy aspirin and Plavix daughter get well without bruising or bleeding.   ROS:   14 system review of systems is positive for  memory loss, numbness, slurred speech, dizziness, and to move much sleep, decreased energy, sleepiness, feeling cold, runny nose, skin 10:30, urination problems, snoring, shortness of breath, blurred vision, hearing loss, trouble swallowing, rash, itching and all other systems negative  PMH:  Past Medical History:  Diagnosis Date  . Anemia   . Atherosclerosis   . Complication of anesthesia    CONFUSION - Pt's family very concerned about this  . DDD (degenerative disc disease)   . Degenerative arthritis   . Dermatitis, atopic ARMS AND LEGS  .  Diverticulosis   . Erectile dysfunction   . Frequency of urination   . Glaucoma   . History of asbestos exposure   . History of radiation therapy 8/19-13-10/18/11   prostate, 61 GY  . History of shingles 2012-- BILATERAL EYES--  NO RESIDUAL  . Hyperlipidemia   . Hypertension   . Inguinal hernia   .  Lumbar scoliosis   . Nocturia   . OA (osteoarthritis) of hip RIGHT  . Prostate cancer ([place]) 05/11/2011   bx=Adenocarcinoma,gleason3+4=7, 4+4=8,PSA=10.30volume=45.7cc  . Renal cyst    bilateral  . Smokers' cough ([place])   . Thyroid cyst     Social History:  Social History   Socioeconomic History  . Marital status: Married    Spouse name: Alison Murray  . Number of children: 4  . Years of education: College  . Highest education level: Not on file  Social Needs  . Financial resource strain: Not on file  . Food insecurity - worry: Not on file  . Food insecurity - inability: Not on file  . Transportation needs - medical: Not on file  . Transportation needs - non-medical: Not on file  Occupational History  . Occupation:       Employer: [employer]    Comment: Retired  Tobacco Use  . Smoking status: Current Some Day Smoker    Packs/day: 0.50    Years: 62.00    Pack years: 31.00    Types: Cigarettes  . Smokeless tobacco: Never Used  . Tobacco comment: smoke one or two per day  Substance and Sexual Activity  . Alcohol use: No    Alcohol/week: 0.6 oz    Types: 1 Cans of beer per week  . Drug use: No    Comment: quit smoking 02/2012  . Sexual activity: Not on file  Other Topics Concern  . Not on file  Social History Narrative   Patient is Married Designer, television/film set), and lives at home with his wife 62 years   Patient has 3 daughters and 1 son.   Patient has a college education.   Patient is right-handed.   Patient drinks one cup of coffee, 1-2 cups of tea daily and rarely drinks soda.   Retired from [employer]    Medications:   Current Outpatient Medications on File Prior to Visit  Medication Sig Dispense Refill  . acetaminophen (TYLENOL) 500 MG tablet Take 1,000 mg by mouth 2 (two) times daily as needed (for pain).     Marland Kitchen aspirin 81 MG tablet Take 81 mg by mouth daily.    . Calcium Carbonate Antacid 1177 MG CHEW Chew 1 tablet by mouth daily.     . Cholecalciferol (VITAMIN D-3 PO)  Take 1 capsule by mouth daily.     . clopidogrel (PLAVIX) 75 MG tablet Take 75 mg by mouth daily.    . Cyanocobalamin (VITAMIN B12 PO) Take 1 tablet by mouth daily.     . Docosahexaenoic Acid (DHA OMEGA 3 PO) Take 1,000 mg by mouth daily.     Marland Kitchen ezetimibe (ZETIA) 10 MG tablet Take 10 mg by mouth every morning.     . folic acid (FOLVITE) 1 MG tablet Take 1 mg by mouth daily.    . irbesartan (AVAPRO) 75 MG tablet Take 37.5 mg by mouth daily. **REPLACES VALSARTAN**  11  . mirabegron ER (MYRBETRIQ) 50 MG TB24 tablet Take 50 mg by mouth daily.     . Omega-3 Fatty Acids (FISH OIL PO) Take by mouth.    . pravastatin (  PRAVACHOL) 80 MG tablet Take 80 mg by mouth every evening.     . Probiotic Product (PROBIOTIC PO) Take by mouth.    . ranitidine (ZANTAC) 150 MG tablet Take 150 mg by mouth 2 (two) times daily.    Marland Kitchen Ubiquinol 100 MG CAPS Take 100 mg by mouth daily.    . valACYclovir (VALTREX) 1000 MG tablet Take 1,000 mg by mouth 2 (two) times daily.      No current facility-administered medications on file prior to visit.     Allergies:   Allergies  Allergen Reactions  . Dilaudid [Hydromorphone Hcl] Other (See Comments)    Confusion   . Methocarbamol Other (See Comments)    Drowsiness   . Percodan [Oxycodone-Aspirin] Other (See Comments)    Confusion   . Tramadol Other (See Comments)    Drowsiness   . Vicodin [Hydrocodone-Acetaminophen] Other (See Comments)    Confusion     Physical Exam General: well developed, well nourished, seated, in no evident distress Head: head normocephalic and atraumatic.  Neck: supple with no carotid or supraclavicular bruits Cardiovascular: regular rate and rhythm, no murmurs Musculoskeletal: no deformity Skin:  no rash/petichiae Vascular:  Normal pulses all extremities Vitals:   03/21/17 1056  BP: 117/76  Pulse: 66   Neurologic Exam Mental Status: Awake and fully alert. Oriented to place and time. Recent and remote memory poor Attention span,  concentration and fund of knowledge diminished Mood and affect appropriate. Recall 0/3. Able to name only 6 animals with 4 legs. Clock drawing 1/4. Unable to do 3 step commands or copy intersecting pentagons. Mini-Mental status exam scored 20/30 Speech is slow and hesitant slightly hypophonic and dysarthric. Glabellar tap is absent. Palmomental reflexes are present bilaterally. Jaw jerk is brisk. Cranial Nerves: Fundoscopic exam reveals sharp disc margins. Pupils equal, briskly reactive to light. Extraocular movements full without nystagmus. Visual fields full to confrontation. Hearing intact. Facial sensation intact. Face, tongue, palate moves normally and symmetrically. No  tongue atrophy or fasciculations noted. Motor: Normal bulk and tone. Normal strength in all tested extremity muscles. No fasciculations or muscle wasting noted. Sensory.: Diminished touch ,pinprick .position and vibratory sensation from ankle down. Romberg's sign positive.  Coordination: Rapid alternating movements normal in all extremities. Finger-to-nose and heel-to-shin performed accurately bilaterally. Gait and Station: Arises from chair without difficulty. Stance is normal. Gait demonstrates normal stride length and balance . Able to heel, toe and tandem walk with  difficulty.  Reflexes: 1+ and symmetric except ankle jerks are depressed bilaterally. Toes downgoing.       ASSESSMENT: 84 year old African-American gentleman with recent episode of TIA from small vessel disease in February 2019 with suspected underlying mild dementia as well as peripheral neuropathy.    PLAN: I had a long discussion with the patient and her his daughter regarding his recent TIA, pre-existing lymphadenopathy as well as cognitive issues which likely suggests underlying dementia. I recommend the patient continue aspirin but stopped Plavix after 3 days as a do not see any long-term benefit of dual antiplatelet therapy particularly since patient is  a fall risk. Continue strict control of hypertension with blood pressure goal below 130/90, lipids with LDL cholesterol goal below 70 mg percent. Trial of Aricept 5 mg daily for a month to be increased to 10 mg daily to help with his dementia. Check EEG for any subclinical seizures. Patient was counseled to use a cane at all times to avoid falling. Return for follow-up with Jessica minus practitioner in 3 months. He  was also advised to follow-up with Dr. Brett Fairy for ongoing treatment for his sleep apnea with CPAP. Greater than 50% of time during this prolonged 55 minute visit was spent on counseling,explanation of diagnosis of dementia, TIA, neuropathy, planning of further management, discussion with patient and family and coordination of care Antony Contras, MD  [facility] [address] [place], [place] [address]  Phone [phone] Fax [phone] Note: This document was prepared with digital dictation and possible smart phrase technology. Any transcriptional errors that result from this process are unintentional

## 2017-03-23 ENCOUNTER — Ambulatory Visit: Payer: Medicare HMO | Admitting: Occupational Therapy

## 2017-03-23 ENCOUNTER — Encounter: Payer: Medicare HMO | Admitting: Occupational Therapy

## 2017-03-28 ENCOUNTER — Ambulatory Visit: Payer: Medicare HMO | Admitting: Physical Therapy

## 2017-03-28 ENCOUNTER — Encounter: Payer: Self-pay | Admitting: Physical Therapy

## 2017-03-28 ENCOUNTER — Telehealth: Payer: Self-pay | Admitting: Physical Therapy

## 2017-03-28 ENCOUNTER — Ambulatory Visit: Payer: Medicare HMO | Attending: Internal Medicine | Admitting: Occupational Therapy

## 2017-03-28 DIAGNOSIS — R471 Dysarthria and anarthria: Secondary | ICD-10-CM | POA: Insufficient documentation

## 2017-03-28 DIAGNOSIS — R2689 Other abnormalities of gait and mobility: Secondary | ICD-10-CM | POA: Diagnosis present

## 2017-03-28 DIAGNOSIS — M79605 Pain in left leg: Secondary | ICD-10-CM | POA: Diagnosis present

## 2017-03-28 DIAGNOSIS — R2681 Unsteadiness on feet: Secondary | ICD-10-CM | POA: Diagnosis present

## 2017-03-28 DIAGNOSIS — R41841 Cognitive communication deficit: Secondary | ICD-10-CM | POA: Diagnosis present

## 2017-03-28 DIAGNOSIS — M6281 Muscle weakness (generalized): Secondary | ICD-10-CM

## 2017-03-28 DIAGNOSIS — R278 Other lack of coordination: Secondary | ICD-10-CM | POA: Diagnosis present

## 2017-03-28 DIAGNOSIS — I69815 Cognitive social or emotional deficit following other cerebrovascular disease: Secondary | ICD-10-CM | POA: Insufficient documentation

## 2017-03-28 DIAGNOSIS — R42 Dizziness and giddiness: Secondary | ICD-10-CM

## 2017-03-28 DIAGNOSIS — R1312 Dysphagia, oropharyngeal phase: Secondary | ICD-10-CM | POA: Insufficient documentation

## 2017-03-28 DIAGNOSIS — R41842 Visuospatial deficit: Secondary | ICD-10-CM | POA: Insufficient documentation

## 2017-03-28 DIAGNOSIS — R262 Difficulty in walking, not elsewhere classified: Secondary | ICD-10-CM | POA: Diagnosis present

## 2017-03-28 NOTE — Therapy (Signed)
[facility] [address] [place], [place], [address] Phone: [phone]   Fax:  [phone]  Occupational Therapy Treatment  Patient Details  Name: Derek Carr MRN: [id number] Date of Birth: October 13, 1931 Referring Provider: Dr [name]   Encounter Date: 03/28/2017  OT End of Session - 03/28/17 1414    Visit Number  2    Number of Visits  17    Authorization Type  [employer] MCR    OT Start Time  1315    OT Stop Time  1400    OT Time Calculation (min)  45 min    Activity Tolerance  Patient tolerated treatment well    Behavior During Therapy  [facility] for tasks assessed/performed       Past Medical History:  Diagnosis Date  . Anemia   . Atherosclerosis   . Complication of anesthesia    CONFUSION - Pt's family very concerned about this  . DDD (degenerative disc disease)   . Degenerative arthritis   . Dermatitis, atopic ARMS AND LEGS  . Diverticulosis   . Erectile dysfunction   . Frequency of urination   . Glaucoma   . History of asbestos exposure   . History of radiation therapy 8/19-13-10/18/11   prostate, 29 GY  . History of shingles 2012-- BILATERAL EYES--  NO RESIDUAL  . Hyperlipidemia   . Hypertension   . Inguinal hernia   . Lumbar scoliosis   . Nocturia   . OA (osteoarthritis) of hip RIGHT  . Prostate cancer ([place]) 05/11/2011   bx=Adenocarcinoma,gleason3+4=7, 4+4=8,PSA=10.30volume=45.7cc  . Renal cyst    bilateral  . Smokers' cough ([place])   . Thyroid cyst     Past Surgical History:  Procedure Laterality Date  . ATTEMPTED LEFT VATS/ LEFT THORACOTOMY/ RESECTION OF THE ENORMOUS, PROBABLE BRONCHOGENIC CYST  01-04-2005  DR Arlyce Dice  . CARDIAC CATHETERIZATION  02-24-2009  DR NASHER   MINOR CORONARY ARTERY IRREGULARITIES/ NORMAL LVSF/ EF 65-70%  . CATARACT EXTRACTION    . COLONOSCOPY W/ POLYPECTOMY    . CYSTOSCOPY  11/15/2011   Procedure: CYSTOSCOPY;  Surgeon: Dutch Gray, MD;  Location: [facility];  Service: Urology;  Laterality: N/A;  no seeds seen in bladder  . esophageus cyst removal  YRS AGO  . [place]  . PROSTATE BIOPSY  05/11/11   Adenocarcinoma ([facility] OFFICE)  . RADIOACTIVE SEED IMPLANT  11/15/2011   Procedure: RADIOACTIVE SEED IMPLANT;  Surgeon: Dutch Gray, MD;  Location: [facility];  Service: Urology;  Laterality: N/A;  Total number of seeds - 52  . REPAIR RIGHT INGUINAL HERNIA W/ MESH  09-10-1999  . TOTAL HIP ARTHROPLASTY Right 03/20/2013   Procedure: RIGHT TOTAL HIP ARTHROPLASTY ANTERIOR APPROACH;  Surgeon: [name], MD;  Location: [facility];  Service: Orthopedics;  Laterality: Right;  . TOTAL HIP REVISION Right 05/14/2013   Procedure: open reduction internal fixation REVISION RIGHT HIP ;  Surgeon: [name], MD;  Location: [facility];  Service: Orthopedics;  Laterality: Right;  . UMBILICAL HERNIA REPAIR  1998   epigastric    There were no vitals filed for this visit.  Subjective Assessment - 03/28/17 1324    Subjective   I don't do anything fast.  I am afraid of falling- I take my cane whenever I walk.  I've had dizziness even before the stroke    Pertinent History  TIA'S, most recent 02/2017, 3 year history of dizziness, decreased baalnce, orthostatic hypotension,  recent cataract extraction - 2 weeks ago    Currently in Pain?  No/denies                   OT Treatments/Exercises (OP) - 03/28/17 0001      ADLs   UB Dressing  Practiced donning/doffing jacket in standing. Pt could do I"ly, but therapist did recommend if he was unstable or dizzy to stand against counter or stable surface when donning/doffing jacket or to sit if needed.     ADL Comments  Discussed safety with balance especially with reaching into low cabinets - therapist demo how to do safety with one hand countertop support.       Exercises   Exercises  -- see pt instructions for coordination HEP             OT Education - 03/28/17 1346     Education provided  Yes    Education Details  Coordination HEP    Person(s) Educated  Patient    Methods  Explanation;Demonstration;Verbal cues;Handout    Comprehension  Verbalized understanding;Returned demonstration;Verbal cues required;Need further instruction       OT Short Term Goals - 03/28/17 1414      OT SHORT TERM GOAL #1   Title  Patient will don front opening shirt / jacket in standing independently (due 3/20)    Time  4    Period  Weeks    Status  On-going      OT SHORT TERM GOAL #2   Title  Patient will demonstrate reduced time on 9 hole peg test by 5 seconds to improve speed with daily dressing tasks.    Baseline  Results:  right - 50 sec, left 47 sec on 03/14/17    Time  4    Period  Weeks      OT SHORT TERM GOAL #3   Title  Patient will use stovetop to cook simple meal item- e.g. fry an egg with close supervision, verbal cueing.      Time  4    Period  Weeks    Status  New      OT SHORT TERM GOAL #4   Title  Patient will complete a home exercise program designed to improve coordiantion in his hands.      Time  4    Period  Weeks    Status  On-going issued, will need review      OT SHORT TERM GOAL #5   Title  Patient will utilize compensatory strategy to effectively utilize pill box for am and pm medications with supervision.      Time  4    Period  Weeks    Status  New        OT Long Term Goals - 03/14/17 1317      OT LONG TERM GOAL #1   Title  Patient will effectively use simple hand tools in standing (screw driver, wrench, hammer) with supervision in preparation for returning to shop (due 4/19)    Time  8    Period  Weeks    Status  New      OT LONG TERM GOAL #2   Title  Patient will cook a simple familiar hot  meal using stovetop and/or oven- at least two items without assistance    Time  8    Period  Weeks    Status  New      OT LONG TERM GOAL #3   Title  Patient  will reduce speed on 9 hole peg test by 10 seconds to improve functional  coordination during ADL/IADL- tool use.      Baseline  Initial score on 2/18- right 50 seconds, left 47 seconds    Time  8    Period  Weeks    Status  New      OT LONG TERM GOAL #4   Title  Patient will demonstrate adequate environmental visual scanning and functional mobility to safely navigate through crowded hallway, or busy retail store with supervision.      Time  8    Period  Weeks    Status  New      OT LONG TERM GOAL #5   Title  Patient will return to community exercise program with assistance from family member    Time  8    Period  Weeks    Status  New            Plan - 03/28/17 1415    Clinical Impression Statement  Pt's main complaint today is dizziness and balance. Pt progressing towards STG's #1 and #4 today    Occupational Profile and client history currently impacting functional performance  Patient is married - wife has dementia, father - 4 children live nearby, retired Cabin crew, used to exercise at least 3 days/week - walking class, and water exercises, patient enjoys outdoor work, and working on cars in his shop.      Occupational performance deficits (Please refer to evaluation for details):  ADL's;IADL's;Leisure;Social Participation    OT Frequency  2x / week    OT Duration  8 weeks    OT Treatment/Interventions  Self-care/ADL training;Energy conservation;Visual/perceptual remediation/compensation;Aquatic Therapy;DME and/or AE instruction;Patient/family education;Balance training;Functional Furniture conservator/restorer;Therapeutic exercise;Therapeutic activities;Neuromuscular education    Plan  review coordination HEP, dynamic standing tasks as able. Pt may benefit from low vision evaluation    Consulted and Agree with Plan of Care  Patient       Patient will benefit from skilled therapeutic intervention in order to improve the following deficits and impairments:  Impaired vision/preception, Improper body mechanics, Impaired perceived functional ability, Decreased  activity tolerance, Decreased knowledge of use of DME, Decreased strength, Impaired sensation, Decreased mobility, Decreased balance, Decreased cognition, Decreased safety awareness  Visit Diagnosis: Unsteadiness on feet  Other lack of coordination    Problem List Patient Active Problem List   Diagnosis Date Noted  . Axonal neuropathy 03/21/2017  . Right sided weakness 03/01/2017  . Cigarette smoker 06/16/2016  . Dyspnea on exertion 06/15/2016  . Alzheimer disease 01/07/2014  . CSA (central sleep apnea) 10/05/2013  . Syncope 06/26/2013  . COPD GOLD II 06/25/2013  . BPH (benign prostatic hyperplasia) 05/18/2013  . Glaucoma 05/18/2013  . S/P right TH revision 05/14/2013  . Constipation 03/26/2013  . Osteoporosis 03/26/2013  . Expected blood loss anemia 03/23/2013  . S/P right THA, AA 03/20/2013  . Hyperlipidemia   . Erectile dysfunction   . History of asbestos exposure   . Nocturia   . History of shingles   . Dermatitis, atopic   . OA (osteoarthritis) of hip   . Arthritis   . Frequency of urination   . Lumbar scoliosis   . Malignant neoplasm of prostate ([place]) 06/07/2011  . 84 year old gentleman with stage T3 adenocarcinoma prostate with Gleason score 4+4 and PSA of 10.3 05/11/2011    Carey Bullocks, OTR/L 03/28/2017, 2:17 PM  [facility] [address] [place], [place],  [address] Phone: [phone]   Fax:  [phone]  Name: Derek Carr MRN: [id number] Date of Birth: Jan 25, 1932

## 2017-03-28 NOTE — Therapy (Signed)
[facility] [address] [place], [place], [address] Phone: [phone]   Fax:  [phone]  Physical Therapy Treatment  Patient Details  Name: Derek Carr MRN: [id number] Date of Birth: [birth year] Referring Provider: Dr [name]   Encounter Date: 03/28/2017  PT End of Session - 03/28/17 1650    Visit Number  2    Number of Visits  17    Date for PT Re-Evaluation  05/13/17    Authorization Type  Aetna Medicare & Generic commercial 2nd    PT Start Time  1405    PT Stop Time  1445    PT Time Calculation (min)  40 min    Equipment Utilized During Treatment  --    Activity Tolerance  Patient tolerated treatment well    Behavior During Therapy  [facility] for tasks assessed/performed       Past Medical History:  Diagnosis Date  . Anemia   . Atherosclerosis   . Complication of anesthesia    CONFUSION - Pt's family very concerned about this  . DDD (degenerative disc disease)   . Degenerative arthritis   . Dermatitis, atopic ARMS AND LEGS  . Diverticulosis   . Erectile dysfunction   . Frequency of urination   . Glaucoma   . History of asbestos exposure   . History of radiation therapy 8/19-13-10/18/11   prostate, 90 GY  . History of shingles 2012-- BILATERAL EYES--  NO RESIDUAL  . Hyperlipidemia   . Hypertension   . Inguinal hernia   . Lumbar scoliosis   . Nocturia   . OA (osteoarthritis) of hip RIGHT  . Prostate cancer ([place]) 05/11/2011   bx=Adenocarcinoma,gleason3+4=7, 4+4=8,PSA=10.30volume=45.7cc  . Renal cyst    bilateral  . Smokers' cough ([place])   . Thyroid cyst     Past Surgical History:  Procedure Laterality Date  . ATTEMPTED LEFT VATS/ LEFT THORACOTOMY/ RESECTION OF THE ENORMOUS, PROBABLE BRONCHOGENIC CYST  01-04-2005  DR Arlyce Dice  . CARDIAC CATHETERIZATION  02-24-2009  DR NASHER   MINOR CORONARY ARTERY IRREGULARITIES/ NORMAL LVSF/ EF 65-70%  . CATARACT EXTRACTION    . COLONOSCOPY W/ POLYPECTOMY     . CYSTOSCOPY  11/15/2011   Procedure: CYSTOSCOPY;  Surgeon: Dutch Gray, MD;  Location: [facility];  Service: Urology;  Laterality: N/A;  no seeds seen in bladder  . esophageus cyst removal  YRS AGO  . [place]  . PROSTATE BIOPSY  05/11/11   Adenocarcinoma ([facility] OFFICE)  . RADIOACTIVE SEED IMPLANT  11/15/2011   Procedure: RADIOACTIVE SEED IMPLANT;  Surgeon: Dutch Gray, MD;  Location: [facility];  Service: Urology;  Laterality: N/A;  Total number of seeds - 52  . REPAIR RIGHT INGUINAL HERNIA W/ MESH  09-10-1999  . TOTAL HIP ARTHROPLASTY Right 03/20/2013   Procedure: RIGHT TOTAL HIP ARTHROPLASTY ANTERIOR APPROACH;  Surgeon: [name], MD;  Location: [facility];  Service: Orthopedics;  Laterality: Right;  . TOTAL HIP REVISION Right 05/14/2013   Procedure: open reduction internal fixation REVISION RIGHT HIP ;  Surgeon: [name], MD;  Location: [facility];  Service: Orthopedics;  Laterality: Right;  . UMBILICAL HERNIA REPAIR  1998   epigastric    There were no vitals filed for this visit.  Subjective Assessment - 03/28/17 1407    Subjective  Pt reporting that dizziness comes and goes.  No issues or falls over the weekend.  Agreeable to vestibular evaluation today.  Pertinent History  TIA, DDD, Lumbar scoliosis, Glaucoma, cataract surgery, HTN, right THR 03/20/13 with revision 05/14/13, right femur fracture    Limitations  Standing;Walking;House hold activities    Patient Stated Goals  Patient wants to improve his balance & walking    Currently in Pain?  No/denies       Vestibular Assessment - 03/28/17 1408      Vestibular Assessment   General Observation  reports dizziness comes and goes but began 6-8 months ago; feels that it may be a little worse after TIA.  Denies changes in vision, hearing, nausea, vomiting or tinnitus.  Reports intermittent numbness in feet but it comes and goes; none today.      Symptom Behavior   Type of Dizziness   Imbalance    Frequency of Dizziness  intermittent but daily    Duration of Dizziness  unknown    Aggravating Factors  Activity in general    Relieving Factors  No known relieving factors      Occulomotor Exam   Occulomotor Alignment  Normal    Spontaneous  Absent    Gaze-induced  Absent    Smooth Pursuits  Comment intact but extra head movements noted    Saccades  Slow;Comment extra head movements    Comment  convergence impaired      Vestibulo-Occular Reflex   VOR to Slow Head Movement  Normal    VOR Cancellation  Normal    Comment  HIT: negative bilaterally      Positional Sensitivities   Nose to Right Knee  No dizziness    Right Knee to Sitting  No dizziness    Nose to Left Knee  No dizziness    Left Knee to Sitting  No dizziness    Head Turning x 5  No dizziness    Head Nodding x 5  No dizziness    Pivot Right in Standing  No dizziness    Pivot Left in Standing  No dizziness    Positional Sensitivities Comments  no dizziness today with sit <> stand      Orthostatics   BP supine (x 5 minutes)  175/102    HR supine (x 5 minutes)  64    BP sitting  140/95    HR sitting  71    BP standing (after 1 minute)  145/90    HR standing (after 1 minute)  76    BP standing (after 3 minutes)  159/102    HR standing (after 3 minutes)  72    Orthostatics Comment  no symptoms with transitions                      PT Education - 03/28/17 1650    Education provided  Yes    Education Details  fluctuations in BP; no vestibular impairments found with vestibular evaluation    Person(s) Educated  Patient;Child(ren)    Methods  Explanation;Handout    Comprehension  Verbalized understanding       PT Short Term Goals - 03/14/17 1250      PT SHORT TERM GOAL #1   Title  Pt will be independent with initial HEP exercises (All STGs Target Date 04/13/2017)    Time  1    Period  Months    Status  New    Target Date  04/13/17      PT SHORT TERM GOAL #2   Title  Pt will  demonstrate ramp & Curb navigation Minimal  Guard to improve confidence and safety ambulating in home and community environment     Time  1    Period  Months    Status  New    Target Date  04/13/17      PT SHORT TERM GOAL #3   Title  Patient ambuates with LRAD scanning environment with no balance loss.     Time  1    Period  Months    Status  New    Target Date  04/13/17      PT SHORT TERM GOAL #4   Title  Berg Balance >/= 30/56    Time  1    Period  Months    Status  New    Target Date  04/13/17      PT SHORT TERM GOAL #5   Title  Patient ambulates 200' with LRAD with supervision.     Time  1    Period  Months    Status  New    Target Date  04/13/17        PT Long Term Goals - 03/14/17 1254      PT LONG TERM GOAL #1   Title  Pt will demonstrate independence with ongoing HEP program and fitness plan (All LTGs Target Date 05/13/2017)    Time  2    Period  Months    Status  New    Target Date  05/13/17      PT LONG TERM GOAL #2   Title  Pt will demonstrate Mod I 500' ambulation outdoors including grass, pavement, curbs, ramps with LRAD to improve community ambulatory skills    Time  2    Period  Months    Status  New    Target Date  05/13/17      PT LONG TERM GOAL #3   Title  Pt will demonstrate Berg >/= 36/56 in order to reduce falls risk & indicate less dependency in ADLs.    Baseline  Berg 24/56 (03/14/17)    Time  2    Period  Months    Status  New    Target Date  05/13/17      PT LONG TERM GOAL #4   Title  Patient performs Timed Up-Go time with cane <17 seconds to indicate lower fall risk.     Baseline  With cane 21.72 without cane 19.75(2/18)    Time  2    Period  Months    Status  New    Target Date  05/13/17      PT LONG TERM GOAL #5   Title  Pt will negotiate 2 stairs without Rail assist with LRAD with family supervision to improve safety when entering/ exiting home.     Time  2    Period  Months    Status  New    Target Date  05/13/17             Plan - 03/28/17 1651    Clinical Impression Statement  Treatment session today focused on evaluation of vestibular system due to pt ongoing c/o dizziness x 6-8 months.  Pt found to + orthostasis as indicated by drop in BP during supine > sit and sit > initial stand but was negative for symptoms of dizziness or imbalance; pt also noted to have significant HTN in supine and prolonged standing positions.  No impairments found with vestibular system.  Symptoms appear to be more vascular in nature; educated pt and daughter  on BP fluctuations.  Will alert PCP and also advised pt and family to contact MD for further guidance.  Will continue to address balance, strength and gait impairments to continue to progress towards LTG.    Rehab Potential  Good    Clinical Impairments Affecting Rehab Potential  progressive decline over 2 years, good attitude and family support with 4 children in area.     PT Frequency  2x / week    PT Duration  Other (comment) 9 weeks    PT Treatment/Interventions  Gait training;Functional mobility training;Neuromuscular re-education;Balance training;Therapeutic exercise;Therapeutic activities;Patient/family education;ADLs/Self Care Home Management;Stair training;Orthotic Fit/Training;Manual techniques;Vestibular    PT Next Visit Plan  ASSESS BP -WAS HIGH ON MONDAY (170/102); development of initial HEP ( balance, scanning tasks, proximal hip strengthening)    Consulted and Agree with Plan of Care  Patient;Family member/caregiver    Family Member Consulted  daughter       Patient will benefit from skilled therapeutic intervention in order to improve the following deficits and impairments:  Abnormal gait, Decreased balance, Decreased endurance, Decreased mobility, Difficulty walking, Decreased activity tolerance, Decreased strength, Decreased safety awareness  Visit Diagnosis: Unsteadiness on feet  Muscle weakness (generalized)  Other abnormalities of gait and  mobility  Dizziness and giddiness     Problem List Patient Active Problem List   Diagnosis Date Noted  . Axonal neuropathy 03/21/2017  . Right sided weakness 03/01/2017  . Cigarette smoker 06/16/2016  . Dyspnea on exertion 06/15/2016  . Alzheimer disease 01/07/2014  . CSA (central sleep apnea) 10/05/2013  . Syncope 06/26/2013  . COPD GOLD II 06/25/2013  . BPH (benign prostatic hyperplasia) 05/18/2013  . Glaucoma 05/18/2013  . S/P right TH revision 05/14/2013  . Constipation 03/26/2013  . Osteoporosis 03/26/2013  . Expected blood loss anemia 03/23/2013  . S/P right THA, AA 03/20/2013  . Hyperlipidemia   . Erectile dysfunction   . History of asbestos exposure   . Nocturia   . History of shingles   . Dermatitis, atopic   . OA (osteoarthritis) of hip   . Arthritis   . Frequency of urination   . Lumbar scoliosis   . Malignant neoplasm of prostate ([place]) 06/07/2011  . 84 year old gentleman with stage T3 adenocarcinoma prostate with Gleason score 4+4 and PSA of 10.3 05/11/2011   Rico Junker, PT, DPT 03/28/17    4:58 PM   [facility] [address] [place], [place], [address] Phone: [phone]   Fax:  [phone]  Name: WALFRED BETTENDORF MRN: [id number] Date of Birth: Mar 15, 1931

## 2017-03-28 NOTE — Telephone Encounter (Signed)
Hello Dr. Joylene Draft, I just wanted to make you aware of Derek Carr's orthostatic BP readings taken today during his PT session due to pt reporting dizziness during transitional movements.    Orthostatics    BP supine (x 5 minutes)  175/102    HR supine (x 5 minutes)  64    BP sitting  140/95    HR sitting  71    BP standing (after 1 minute)  145/90    HR standing (after 1 minute)  76    BP standing (after 3 minutes)  159/102    HR standing (after 3 minutes)  72    Orthostatics Comment  no symptoms with transitions    Pt did not experience any symptoms of lightheadness or imbalance from supine > sit or sit > stand but did experience a significant drop in systolic BP.  I also was concerned with how high his diastolic BP was during supine and after he had been standing >3 minutes.  I advised the patient and his daughter to f/u with you for further assessment.  Any guidance or recommendations you can provide [place] would be greatly appreciated.    Thank you, Rico Junker, PT, DPT 03/28/17    5:05 PM

## 2017-03-28 NOTE — Patient Instructions (Signed)
  Coordination Activities  Perform the following activities for 10 minutes 1-2 times per day with BOTH hand(s).   Rotate ball in fingertips (clockwise and counter-clockwise x 3 revolutions each way).   Flip cards 1 at a time as fast as you can. (Do 1/2 deck with Rt hand, 1/2 deck with Lt hand)   Deal cards with your thumb (Hold deck in hand and push card off top with thumb).   Rotate one card in hand (clockwise and counter-clockwise x 3 revolutions each way).   Pick up coins one at a time until you get 5 in your hand, then move coins from palm to fingertips to stack one at a time. Do 2 stacks of 5 with each hand

## 2017-03-29 ENCOUNTER — Ambulatory Visit: Payer: Medicare HMO | Admitting: Speech Pathology

## 2017-03-30 ENCOUNTER — Encounter: Payer: Self-pay | Admitting: Occupational Therapy

## 2017-03-30 ENCOUNTER — Ambulatory Visit: Payer: Medicare HMO | Admitting: Occupational Therapy

## 2017-03-30 DIAGNOSIS — R41842 Visuospatial deficit: Secondary | ICD-10-CM

## 2017-03-30 DIAGNOSIS — M6281 Muscle weakness (generalized): Secondary | ICD-10-CM

## 2017-03-30 DIAGNOSIS — R2681 Unsteadiness on feet: Secondary | ICD-10-CM | POA: Diagnosis not present

## 2017-03-30 DIAGNOSIS — I69815 Cognitive social or emotional deficit following other cerebrovascular disease: Secondary | ICD-10-CM

## 2017-03-30 DIAGNOSIS — R278 Other lack of coordination: Secondary | ICD-10-CM

## 2017-03-30 NOTE — Therapy (Signed)
[facility] [address] [place], [place], [address] Phone: [phone]   Fax:  [phone]  Occupational Therapy Treatment  Patient Details  Name: Derek Carr MRN: [id number] Date of Birth: [birth year] Referring Provider: Dr [name]   Encounter Date: 03/30/2017  OT End of Session - 03/30/17 1651    Visit Number  3    Number of Visits  17    Authorization Type  Aetna MCR    OT Start Time  1446    OT Stop Time  1528    OT Time Calculation (min)  42 min       Past Medical History:  Diagnosis Date  . Anemia   . Atherosclerosis   . Complication of anesthesia    CONFUSION - Pt's family very concerned about this  . DDD (degenerative disc disease)   . Degenerative arthritis   . Dermatitis, atopic ARMS AND LEGS  . Diverticulosis   . Erectile dysfunction   . Frequency of urination   . Glaucoma   . History of asbestos exposure   . History of radiation therapy 8/19-13-10/18/11   prostate, 2 GY  . History of shingles 2012-- BILATERAL EYES--  NO RESIDUAL  . Hyperlipidemia   . Hypertension   . Inguinal hernia   . Lumbar scoliosis   . Nocturia   . OA (osteoarthritis) of hip RIGHT  . Prostate cancer ([place]) 05/11/2011   bx=Adenocarcinoma,gleason3+4=7, 4+4=8,PSA=10.30volume=45.7cc  . Renal cyst    bilateral  . Smokers' cough ([place])   . Thyroid cyst     Past Surgical History:  Procedure Laterality Date  . ATTEMPTED LEFT VATS/ LEFT THORACOTOMY/ RESECTION OF THE ENORMOUS, PROBABLE BRONCHOGENIC CYST  01-04-2005  DR Arlyce Dice  . CARDIAC CATHETERIZATION  02-24-2009  DR NASHER   MINOR CORONARY ARTERY IRREGULARITIES/ NORMAL LVSF/ EF 65-70%  . CATARACT EXTRACTION    . COLONOSCOPY W/ POLYPECTOMY    . CYSTOSCOPY  11/15/2011   Procedure: CYSTOSCOPY;  Surgeon: Dutch Gray, MD;  Location: [facility];  Service: Urology;  Laterality: N/A;  no seeds seen in bladder  . esophageus cyst removal  YRS AGO  . [place]  . PROSTATE BIOPSY  05/11/11   Adenocarcinoma ([facility] OFFICE)  . RADIOACTIVE SEED IMPLANT  11/15/2011   Procedure: RADIOACTIVE SEED IMPLANT;  Surgeon: Dutch Gray, MD;  Location: [facility];  Service: Urology;  Laterality: N/A;  Total number of seeds - 52  . REPAIR RIGHT INGUINAL HERNIA W/ MESH  09-10-1999  . TOTAL HIP ARTHROPLASTY Right 03/20/2013   Procedure: RIGHT TOTAL HIP ARTHROPLASTY ANTERIOR APPROACH;  Surgeon: [name], MD;  [facility];  Service: Orthopedics;  Laterality: Right;  . TOTAL HIP REVISION Right 05/14/2013   Procedure: open reduction internal fixation REVISION RIGHT HIP ;  Surgeon: [name], MD;  Location: [facility];  Service: Orthopedics;  Laterality: Right;  . UMBILICAL HERNIA REPAIR  1998   epigastric    There were no vitals filed for this visit.  Subjective Assessment - 03/30/17 1449    Subjective   I am doing pretty well getting dressed - I don't feel like I am going to fall or lose my balance    Pertinent History  TIA'S, most recent 02/2017, 3 year history of dizziness, decreased baalnce, orthostatic hypotension, recent cataract extraction - 2 weeks ago                   OT  Treatments/Exercises (OP) - 03/30/17 0001      ADLs   ADL Comments  Pt abe to take coat off in standing - pt also reports he donned shirt in standing this morning and was able to button shirt the last few days by himself.       Exercises   Exercises  Hand      Hand Exercises   Other Hand Exercises  Pt issued HEP for coordination last session. Pt reported he worked with small ball but did not do pennies or cards "because I didn't have any. Can I do them here?"  Practiced flipping cards and manipulating 5 pennies at  a time in hand for B hands.       Neurological Re-education Exercises   Other Exercises 1  Neuro re ed to address dynamic standing balance with sit to stand, dynamic standing balance during home making activities (required  to lean down, reach up and reach laterally) as well as to determine impact of activity tolerance on balance.  Pt fatigues relatively quickly and balance becomes more impaired with fatigue. Pt very aware of safety and actively employs strategies for safety relative to mobility.                OT Short Term Goals - 03/30/17 1649      OT SHORT TERM GOAL #1   Title  Patient will don front opening shirt / jacket in standing independently (due 3/20)    Time  4    Period  Weeks    Status  Achieved      OT SHORT TERM GOAL #2   Title  Patient will demonstrate reduced time on 9 hole peg test by 5 seconds to improve speed with daily dressing tasks.    Baseline  Results:  right - 50 sec, left 47 sec on 03/14/17    Time  4    Period  Weeks    Status  On-going      OT SHORT TERM GOAL #3   Title  Patient will use stovetop to cook simple meal item- e.g. fry an egg with close supervision, verbal cueing.      Time  4    Period  Weeks    Status  On-going      OT SHORT TERM GOAL #4   Title  Patient will complete a home exercise program designed to improve coordiantion in his hands.      Time  4    Period  Weeks    Status  On-going issued, will need review      OT SHORT TERM GOAL #5   Title  Patient will utilize compensatory strategy to effectively utilize pill box for am and pm medications with supervision.      Time  4    Period  Weeks    Status  On-going        OT Long Term Goals - 03/30/17 1504      OT LONG TERM GOAL #1   Title  Patient will effectively use simple hand tools in standing (screw driver, wrench, hammer) with supervision in preparation for returning to shop (due 4/19)    Time  8    Period  Weeks    Status  New      OT LONG TERM GOAL #2   Title  Patient will cook a simple familiar hot  meal using stovetop and/or oven- at least two items without assistance    Time  8  Period  Weeks    Status  New      OT LONG TERM GOAL #3   Title  Patient will reduce speed on  9 hole peg test by 10 seconds to improve functional coordination during ADL/IADL- tool use.      Baseline  Initial score on 2/18- right 50 seconds, left 47 seconds    Time  8    Period  Weeks    Status  New      OT LONG TERM GOAL #4   Title  Patient will demonstrate adequate environmental visual scanning and functional mobility to safely navigate through crowded hallway, or busy retail store with supervision.      Time  8    Period  Weeks    Status  New      OT LONG TERM GOAL #5   Title  Patient will return to community exercise program with assistance from family member    Time  8    Period  Weeks    Status  New            Plan - 03/30/17 1650    Clinical Impression Statement  Pt progressing toward goals.  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      Rehab Potential  Good    OT Frequency  2x / week    OT Duration  8 weeks    OT Treatment/Interventions  Self-care/ADL training;Energy conservation;Visual/perceptual remediation/compensation;Aquatic Therapy;DME and/or AE instruction;Patient/family education;Balance training;Functional Furniture conservator/restorer;Therapeutic exercise;Therapeutic activities;Neuromuscular education    Plan  review coordination HEP, dynamic standing tasks as able. Pt may benefit from low vision evaluation    Consulted and Agree with Plan of Care  Patient       Patient will benefit from skilled therapeutic intervention in order to improve the following deficits and impairments:  Impaired vision/preception, Improper body mechanics, Impaired perceived functional ability, Decreased activity  tolerance, Decreased knowledge of use of DME, Decreased strength, Impaired sensation, Decreased mobility, Decreased balance, Decreased cognition, Decreased safety awareness  Visit Diagnosis: Unsteadiness on feet  Muscle weakness (generalized)  Other lack of coordination  Cognitive social or emotional deficit following other cerebrovascular disease  Visuospatial deficit    Problem List Patient Active Problem List   Diagnosis Date Noted  . Axonal neuropathy 03/21/2017  . Right sided weakness 03/01/2017  . Cigarette smoker 06/16/2016  . Dyspnea on exertion 06/15/2016  . Alzheimer disease 01/07/2014  . CSA (central sleep apnea) 10/05/2013  . Syncope 06/26/2013  . COPD GOLD II 06/25/2013  . BPH (benign prostatic hyperplasia) 05/18/2013  . Glaucoma 05/18/2013  . S/P right TH revision 05/14/2013  . Constipation 03/26/2013  . Osteoporosis 03/26/2013  . Expected blood loss anemia 03/23/2013  . S/P right THA, AA 03/20/2013  . Hyperlipidemia   . Erectile dysfunction   . History of asbestos exposure   . Nocturia   . History of shingles   . Dermatitis, atopic   . OA (osteoarthritis) of hip   . Arthritis   . Frequency of urination   . Lumbar scoliosis   . Malignant neoplasm of prostate ([place]) 06/07/2011  .  84 year old gentleman with stage T3 adenocarcinoma prostate with Gleason score 4+4 and PSA of 10.3 05/11/2011    [facility] 03/30/2017, 4:52 PM  [facility] [address] [place], [place], [address] Phone: [phone]   Fax:  [phone]  Name: Derek Carr MRN: [id number] Date of Birth: March 25, 1931

## 2017-03-31 ENCOUNTER — Ambulatory Visit: Payer: Medicare HMO | Admitting: Speech Pathology

## 2017-04-04 ENCOUNTER — Ambulatory Visit (INDEPENDENT_AMBULATORY_CARE_PROVIDER_SITE_OTHER): Payer: Medicare HMO | Admitting: Neurology

## 2017-04-04 DIAGNOSIS — R410 Disorientation, unspecified: Secondary | ICD-10-CM

## 2017-04-04 DIAGNOSIS — G459 Transient cerebral ischemic attack, unspecified: Secondary | ICD-10-CM

## 2017-04-05 ENCOUNTER — Ambulatory Visit: Payer: Medicare HMO | Admitting: Occupational Therapy

## 2017-04-05 ENCOUNTER — Encounter: Payer: Self-pay | Admitting: Occupational Therapy

## 2017-04-05 ENCOUNTER — Ambulatory Visit: Payer: Medicare HMO | Admitting: Speech Pathology

## 2017-04-05 DIAGNOSIS — R2681 Unsteadiness on feet: Secondary | ICD-10-CM

## 2017-04-05 DIAGNOSIS — I69815 Cognitive social or emotional deficit following other cerebrovascular disease: Secondary | ICD-10-CM

## 2017-04-05 DIAGNOSIS — R278 Other lack of coordination: Secondary | ICD-10-CM

## 2017-04-05 DIAGNOSIS — R41842 Visuospatial deficit: Secondary | ICD-10-CM

## 2017-04-05 DIAGNOSIS — M6281 Muscle weakness (generalized): Secondary | ICD-10-CM

## 2017-04-05 NOTE — Therapy (Signed)
[facility] [address] [place], [place], [address] Phone: [phone]   Fax:  [phone]  Occupational Therapy Treatment  Patient Details  Name: Derek Carr MRN: [id number] Date of Birth: May 13, 1931 Referring Provider: Dr [name]   Encounter Date: 04/05/2017  OT End of Session - 04/05/17 1551    Visit Number  4    Number of Visits  17    Authorization Type  Aetna MCR    OT Start Time  2297    OT Stop Time  1530    OT Time Calculation (min)  43 min    Activity Tolerance  Patient tolerated treatment well    Behavior During Therapy  [facility] for tasks assessed/performed       Past Medical History:  Diagnosis Date  . Anemia   . Atherosclerosis   . Complication of anesthesia    CONFUSION - Pt's family very concerned about this  . DDD (degenerative disc disease)   . Degenerative arthritis   . Dermatitis, atopic ARMS AND LEGS  . Diverticulosis   . Erectile dysfunction   . Frequency of urination   . Glaucoma   . History of asbestos exposure   . History of radiation therapy 8/19-13-10/18/11   prostate, 50 GY  . History of shingles 2012-- BILATERAL EYES--  NO RESIDUAL  . Hyperlipidemia   . Hypertension   . Inguinal hernia   . Lumbar scoliosis   . Nocturia   . OA (osteoarthritis) of hip RIGHT  . Prostate cancer ([place]) 05/11/2011   bx=Adenocarcinoma,gleason3+4=7, 4+4=8,PSA=10.30volume=45.7cc  . Renal cyst    bilateral  . Smokers' cough ([place])   . Thyroid cyst     Past Surgical History:  Procedure Laterality Date  . ATTEMPTED LEFT VATS/ LEFT THORACOTOMY/ RESECTION OF THE ENORMOUS, PROBABLE BRONCHOGENIC CYST  01-04-2005  DR Arlyce Dice  . CARDIAC CATHETERIZATION  02-24-2009  DR NASHER   MINOR CORONARY ARTERY IRREGULARITIES/ NORMAL LVSF/ EF 65-70%  . CATARACT EXTRACTION    . COLONOSCOPY W/ POLYPECTOMY    . CYSTOSCOPY  11/15/2011   Procedure: CYSTOSCOPY;  Surgeon: Dutch Gray, MD;  Location: [facility];  Service: Urology;  Laterality: N/A;  no seeds seen in bladder  . esophageus cyst removal  YRS AGO  . [place]  . PROSTATE BIOPSY  05/11/11   Adenocarcinoma (MD OFFICE)  . RADIOACTIVE SEED IMPLANT  11/15/2011   Procedure: RADIOACTIVE SEED IMPLANT;  Surgeon: Dutch Gray, MD;  Location: [facility];  Service: Urology;  Laterality: N/A;  Total number of seeds - 52  . REPAIR RIGHT INGUINAL HERNIA W/ MESH  09-10-1999  . TOTAL HIP ARTHROPLASTY Right 03/20/2013   Procedure: RIGHT TOTAL HIP ARTHROPLASTY ANTERIOR APPROACH;  Surgeon: [name], MD;  Location: [facility];  Service: Orthopedics;  Laterality: Right;  . TOTAL HIP REVISION Right 05/14/2013   Procedure: open reduction internal fixation REVISION RIGHT HIP ;  Surgeon: [name], MD;  Location: [facility];  Service: Orthopedics;  Laterality: Right;  . UMBILICAL HERNIA REPAIR  1998   epigastric    There were no vitals filed for this visit.  Subjective Assessment - 04/05/17 1458    Subjective   I have to get my glasses tomorrow.  I am done with the eye drops    Pertinent History  TIA'S, most recent 02/2017, 3 year history of dizziness, decreased baalnce, orthostatic hypotension, recent cataract extraction - 2 weeks ago    Currently  in Pain?  No/denies    Pain Score  0-No pain                   OT Treatments/Exercises (OP) - 04/05/17 0001      ADLs   Medication Management  Patient reports improved ability to manage medications now that he has a pill box that designates am/pm dosage for pills. "  I don't mix those up anymore"        Hand Exercises   Other Hand Exercises  Worked on functional in hand manipulation, and bilateral coordination - setting nuts/bolts/washers into activity board.  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  Patient able to verbalize some hand numbness that was alos making this taks challenging.  With glasses on,  patient better able to complete task.  Patient should receive new glasses this week, and hopefully this will improve current visual status.  If not significant improvement, may consider low vision evaluation - want to ensure adequate time for healing post surgery.     Other Hand Exercises  Retested 9 hole peg test- patient with significant improvement.               OT Education - 04/05/17 1551    Education provided  Yes    Education Details  possibility of low vision eval    Methods  Explanation    Comprehension  Need further instruction       OT Short Term Goals - 04/05/17 1556      OT SHORT TERM GOAL #1   Title  Patient will don front opening shirt / jacket in standing independently (due 3/20)    Status  Achieved      OT SHORT TERM GOAL #2   Title  Patient will demonstrate reduced time on 9 hole peg test by 5 seconds to improve speed with daily dressing tasks.    Baseline  3/12- right 43.25, left 38.34     Status  Achieved      OT SHORT TERM GOAL #3   Title  Patient will use stovetop to cook simple meal item- e.g. fry an egg with close supervision, verbal cueing.      Status  On-going      OT SHORT TERM GOAL #4   Title  Patient will complete a home exercise program designed to improve coordiantion in his hands.      Status  On-going      OT SHORT TERM GOAL #5   Title  Patient will utilize compensatory strategy to effectively utilize pill box for am and pm medications with supervision.      Status  On-going Patient reports improvement- would like to verify with family member        OT Long Term Goals - 03/30/17 1504      OT LONG TERM GOAL #1   Title  Patient will effectively use simple hand tools in standing (screw driver, wrench, hammer) with supervision in preparation for returning to shop (due 4/19)    Time  8    Period  Weeks    Status  New      OT LONG TERM GOAL #2   Title  Patient will cook a simple familiar hot  meal using stovetop and/or oven- at least  two items without assistance    Time  8    Period  Weeks    Status  New      OT  LONG TERM GOAL #3   Title  Patient will reduce speed on 9 hole peg test by 10 seconds to improve functional coordination during ADL/IADL- tool use.      Baseline  Initial score on 2/18- right 50 seconds, left 47 seconds    Time  8    Period  Weeks    Status  New      OT LONG TERM GOAL #4   Title  Patient will demonstrate adequate environmental visual scanning and functional mobility to safely navigate through crowded hallway, or busy retail store with supervision.      Time  8    Period  Weeks    Status  New      OT LONG TERM GOAL #5   Title  Patient will return to community exercise program with assistance from family member    Time  8    Period  Weeks    Status  New            Plan - 04/05/17 1552    Clinical Impression Statement  Patient progressing toward short term goals.      Occupational Profile and client history currently impacting functional performance  Patient is married - wife has dementia, father - 4 children live nearby, retired Cabin crew, used to exercise at least 3 days/week - walking class, and water exercises, patient enjoys outdoor work, and working on cars in his shop.      Occupational performance deficits (Please refer to evaluation for details):  ADL's;IADL's;Leisure;Social Participation    Rehab Potential  Good    OT Frequency  2x / week    OT Duration  8 weeks    OT Treatment/Interventions  Self-care/ADL training;Energy conservation;Visual/perceptual remediation/compensation;Aquatic Therapy;DME and/or AE instruction;Patient/family education;Balance training;Functional Furniture conservator/restorer;Therapeutic exercise;Therapeutic activities;Neuromuscular education    Plan  please try cooking task - frying an egg - to address dynamic standing, coordination, and activity tolerance/ safety    Clinical Decision Making  Limited treatment options, no task modification necessary     Consulted and Agree with Plan of Care  Patient       Patient will benefit from skilled therapeutic intervention in order to improve the following deficits and impairments:  Impaired vision/preception, Improper body mechanics, Impaired perceived functional ability, Decreased activity tolerance, Decreased knowledge of use of DME, Decreased strength, Impaired sensation, Decreased mobility, Decreased balance, Decreased cognition, Decreased safety awareness  Visit Diagnosis: Unsteadiness on feet  Muscle weakness (generalized)  Other lack of coordination  Cognitive social or emotional deficit following other cerebrovascular disease  Visuospatial deficit    Problem List Patient Active Problem List   Diagnosis Date Noted  . Axonal neuropathy 03/21/2017  . Right sided weakness 03/01/2017  . Cigarette smoker 06/16/2016  . Dyspnea on exertion 06/15/2016  . Alzheimer disease 01/07/2014  . CSA (central sleep apnea) 10/05/2013  . Syncope 06/26/2013  . COPD GOLD II 06/25/2013  . BPH (benign prostatic hyperplasia) 05/18/2013  . Glaucoma 05/18/2013  . S/P right TH revision 05/14/2013  . Constipation 03/26/2013  . Osteoporosis 03/26/2013  . Expected blood loss anemia 03/23/2013  . S/P right THA, AA 03/20/2013  . Hyperlipidemia   . Erectile dysfunction   . History of asbestos exposure   . Nocturia   . History of shingles   . Dermatitis, atopic   . OA (osteoarthritis) of hip   . Arthritis   . Frequency of urination   . Lumbar scoliosis   . Malignant neoplasm of prostate ([place]) 06/07/2011  .  84 year old gentleman with stage T3 adenocarcinoma prostate with Gleason score 4+4 and PSA of 10.3 05/11/2011    Mariah Milling, OTR/L 04/05/2017, 3:58 PM  [facility] [address] [place], [place], [address] Phone: [phone]   Fax:  [phone]  Name: Derek Carr MRN: [id number] Date of Birth: Dec 12, 1931

## 2017-04-06 ENCOUNTER — Telehealth: Payer: Self-pay

## 2017-04-06 NOTE — Telephone Encounter (Signed)
RN call patients daughter [name] on dpr form. Rn stated the EEg was normal. Rn ask if she had any questions about the test.The daughter stated not at this time,and she verbalized understanding. ------

## 2017-04-06 NOTE — Telephone Encounter (Signed)
-----   Message from Garvin Fila, MD sent at 04/05/2017  5:46 PM EDT ----- Mitchell Heir inform the patient that EEG study was normal

## 2017-04-07 ENCOUNTER — Ambulatory Visit: Payer: Medicare HMO | Admitting: Occupational Therapy

## 2017-04-07 ENCOUNTER — Ambulatory Visit: Payer: Medicare HMO

## 2017-04-07 ENCOUNTER — Ambulatory Visit: Payer: Medicare HMO | Admitting: Speech Pathology

## 2017-04-07 VITALS — BP 139/94 | HR 72

## 2017-04-07 DIAGNOSIS — R2681 Unsteadiness on feet: Secondary | ICD-10-CM | POA: Diagnosis not present

## 2017-04-07 DIAGNOSIS — R2689 Other abnormalities of gait and mobility: Secondary | ICD-10-CM

## 2017-04-07 DIAGNOSIS — M6281 Muscle weakness (generalized): Secondary | ICD-10-CM

## 2017-04-07 DIAGNOSIS — R262 Difficulty in walking, not elsewhere classified: Secondary | ICD-10-CM

## 2017-04-07 DIAGNOSIS — R41842 Visuospatial deficit: Secondary | ICD-10-CM

## 2017-04-07 DIAGNOSIS — R278 Other lack of coordination: Secondary | ICD-10-CM

## 2017-04-07 DIAGNOSIS — R42 Dizziness and giddiness: Secondary | ICD-10-CM

## 2017-04-07 DIAGNOSIS — I69815 Cognitive social or emotional deficit following other cerebrovascular disease: Secondary | ICD-10-CM

## 2017-04-07 NOTE — Therapy (Signed)
[facility] [address] [place], [place], [address] Phone: [phone]   Fax:  [phone]  Physical Therapy Treatment  Patient Details  Name: Derek Carr MRN: [id number] Date of Birth: Jul 14, 1931 Referring Provider: Dr [name]   Encounter Date: 04/07/2017   PT End of Session - 04/07/17 1629    Visit Number  3    Number of Visits  17    Date for PT Re-Evaluation  05/13/17    Authorization Type  Aetna Medicare & Generic commercial 2nd    PT Start Time  1540    PT Stop Time  1620    PT Time Calculation (min)  40 min    Equipment Utilized During Treatment  Gait belt    Activity Tolerance  Patient tolerated treatment well    Behavior During Therapy  [facility] for tasks assessed/performed         Past Medical History:  Diagnosis Date  . Anemia   . Atherosclerosis   . Complication of anesthesia    CONFUSION - Pt's family very concerned about this  . DDD (degenerative disc disease)   . Degenerative arthritis   . Dermatitis, atopic ARMS AND LEGS  . Diverticulosis   . Erectile dysfunction   . Frequency of urination   . Glaucoma   . History of asbestos exposure   . History of radiation therapy 8/19-13-10/18/11   prostate, 72 GY  . History of shingles 2012-- BILATERAL EYES--  NO RESIDUAL  . Hyperlipidemia   . Hypertension   . Inguinal hernia   . Lumbar scoliosis   . Nocturia   . OA (osteoarthritis) of hip RIGHT  . Prostate cancer ([place]) 05/11/2011   bx=Adenocarcinoma,gleason3+4=7, 4+4=8,PSA=10.30volume=45.7cc  . Renal cyst    bilateral  . Smokers' cough ([place])   . Thyroid cyst     Past Surgical History:  Procedure Laterality Date  . ATTEMPTED LEFT VATS/ LEFT THORACOTOMY/ RESECTION OF THE ENORMOUS, PROBABLE BRONCHOGENIC CYST  01-04-2005  DR Arlyce Dice  . CARDIAC CATHETERIZATION  02-24-2009  DR NASHER   MINOR CORONARY ARTERY IRREGULARITIES/ NORMAL LVSF/ EF 65-70%  . CATARACT EXTRACTION    . COLONOSCOPY W/  POLYPECTOMY    . CYSTOSCOPY  11/15/2011   Procedure: CYSTOSCOPY;  Surgeon: Dutch Gray, MD;  Location: [facility];  Service: Urology;  Laterality: N/A;  no seeds seen in bladder  . esophageus cyst removal  YRS AGO  . [place]  . PROSTATE BIOPSY  05/11/11   Adenocarcinoma (MD OFFICE)  . RADIOACTIVE SEED IMPLANT  11/15/2011   Procedure: RADIOACTIVE SEED IMPLANT;  Surgeon: Dutch Gray, MD;  Location[facility];  Service: Urology;  Laterality: N/A;  Total number of seeds - 52  . REPAIR RIGHT INGUINAL HERNIA W/ MESH  09-10-1999  . TOTAL HIP ARTHROPLASTY Right 03/20/2013   Procedure: RIGHT TOTAL HIP ARTHROPLASTY ANTERIOR APPROACH;  Surgeon: [name], MD;  Location: [facility];  Service: Orthopedics;  Laterality: Right;  . TOTAL HIP REVISION Right 05/14/2013   Procedure: open reduction internal fixation REVISION RIGHT HIP ;  Surgeon: [name], MD;  Location: [facility];  Service: Orthopedics;  Laterality: Right;  . UMBILICAL HERNIA REPAIR  1998   epigastric    Vitals:   04/07/17 1542 04/07/17 1622  BP: (!) 144/92 (!) 139/94  Pulse: 72 72    Subjective Assessment - 04/07/17 1532    Subjective  pt reports he has had no falls since last  PT session,  and is feeling well overall. Pt is happy to have his new glasses tomorrow.     Pertinent History  TIA, DDD, Lumbar scoliosis, Glaucoma, cataract surgery, HTN, right THR 03/20/13 with revision 05/14/13, right femur fracture    Limitations  Standing;Walking;House hold activities    Patient Stated Goals  Patient wants to improve his balance & walking    Currently in Pain?  No/denies                      [facility] Adult PT Treatment/Exercise - 04/07/17 1541      Transfers   Transfers  Stand to Sit;Sit to Stand    Sit to Stand  5: Supervision;With upper extremity assist;With armrests;From chair/3-in-1      Ambulation/Gait   Ambulation/Gait  Yes    Ambulation/Gait Assistance  4: Min guard     Ambulation/Gait Assistance Details  Neuro re-ed training focused on scanning tasks. No overt LOB noted and pt able to incr. gait speed while scanning.    Ambulation Distance (Feet)  230 Feet with SPC and scanning task    Assistive device  Straight cane    Gait Pattern  Step-through pattern;Decreased arm swing - right;Decreased step length - right;Decreased step length - left;Decreased stride length;Decreased dorsiflexion - right;Trunk flexed;Poor foot clearance - right;Poor foot clearance - left;Decreased stance time - right;Decreased hip/knee flexion - right    Ambulation Surface  Level;Indoor      High Level Balance   High Level Balance Activities  Side stepping;Braiding;Turns;Direction changes;Head turns;Tandem walking;Negotitating around obstacles All min G. and in // bars ( see HEP  for more details)      Cues and demo for technique during balance activities.         PT Education - 04/07/17 1628    Education provided  Yes    Education Details  HTN, high BP and signs.     Person(s) Educated  Patient    Methods  Explanation    Comprehension  Verbalized understanding       PT Short Term Goals - 03/14/17 1250      PT SHORT TERM GOAL #1   Title  Pt will be independent with initial HEP exercises (All STGs Target Date 04/13/2017)    Time  1    Period  Months    Status  New    Target Date  04/13/17      PT SHORT TERM GOAL #2   Title  Pt will demonstrate ramp & Curb navigation Minimal Guard to improve confidence and safety ambulating in home and community environment     Time  1    Period  Months    Status  New    Target Date  04/13/17      PT SHORT TERM GOAL #3   Title  Patient ambuates with LRAD scanning environment with no balance loss.     Time  1    Period  Months    Status  New    Target Date  04/13/17      PT SHORT TERM GOAL #4   Title  Berg Balance >/= 30/56    Time  1    Period  Months    Status  New    Target Date  04/13/17      PT SHORT TERM GOAL #5    Title  Patient ambulates 200' with LRAD with supervision.     Time  1    Period  Months    Status  New    Target Date  04/13/17        PT Long Term Goals - 03/14/17 1254      PT LONG TERM GOAL #1   Title  Pt will demonstrate independence with ongoing HEP program and fitness plan (All LTGs Target Date 05/13/2017)    Time  2    Period  Months    Status  New    Target Date  05/13/17      PT LONG TERM GOAL #2   Title  Pt will demonstrate Mod I 500' ambulation outdoors including grass, pavement, curbs, ramps with LRAD to improve community ambulatory skills    Time  2    Period  Months    Status  New    Target Date  05/13/17      PT LONG TERM GOAL #3   Title  Pt will demonstrate Berg >/= 36/56 in order to reduce falls risk & indicate less dependency in ADLs.    Baseline  Berg 24/56 (03/14/17)    Time  2    Period  Months    Status  New    Target Date  05/13/17      PT LONG TERM GOAL #4   Title  Patient performs Timed Up-Go time with cane <17 seconds to indicate lower fall risk.     Baseline  With cane 21.72 without cane 19.75(2/18)    Time  2    Period  Months    Status  New    Target Date  05/13/17      PT LONG TERM GOAL #5   Title  Pt will negotiate 2 stairs without Rail assist with LRAD with family supervision to improve safety when entering/ exiting home.     Time  2    Period  Months    Status  New    Target Date  05/13/17          Plan - 04/07/17 1630    Clinical Impression Statement  During today's PT session focused on establishing balance HEP that pt can safely complete at home to continue to progress his balance and reduce falls risk. Pt also ambulated 200+ with scanning task at minG level and is making good progress towards reaching his STGs. Pt BP remains high during PT session but pt remains Asymptomatic during session. Pt will continue to benefit from skilled PT intervention to continue to safely progress him towards reaching his goals.    Rehab  Potential  Good    Clinical Impairments Affecting Rehab Potential  progressive decline over 2 years, good attitude and family support with 4 children in area.     PT Frequency  2x / week    PT Treatment/Interventions  Gait training;Functional mobility training;Neuromuscular re-education;Balance training;Therapeutic exercise;Therapeutic activities;Patient/family education;ADLs/Self Care Home Management;Stair training;Orthotic Fit/Training;Manual techniques;Vestibular    PT Next Visit Plan  continue to assess BP during and after session. continue to progress gait skills as well as other high level balance tasks    Consulted and Agree with Plan of Care  Patient           Patient will benefit from skilled therapeutic intervention in order to improve the following deficits and impairments:  Abnormal gait, Decreased balance, Decreased endurance, Decreased mobility, Difficulty walking, Decreased activity tolerance, Decreased strength, Decreased safety awareness  Visit Diagnosis: Unsteadiness on feet  Other lack of coordination  Muscle weakness (generalized)  Other abnormalities of gait and mobility  Dizziness and giddiness  Difficulty in walking, not elsewhere classified     Problem List Patient Active Problem List   Diagnosis Date Noted  . Axonal neuropathy 03/21/2017  . Right sided weakness 03/01/2017  . Cigarette smoker 06/16/2016  . Dyspnea on exertion 06/15/2016  . Alzheimer disease 01/07/2014  . CSA (central sleep apnea) 10/05/2013  . Syncope 06/26/2013  . COPD GOLD II 06/25/2013  . BPH (benign prostatic hyperplasia) 05/18/2013  . Glaucoma 05/18/2013  . S/P right TH revision 05/14/2013  . Constipation 03/26/2013  . Osteoporosis 03/26/2013  . Expected blood loss anemia 03/23/2013  . S/P right THA, AA 03/20/2013  . Hyperlipidemia   . Erectile dysfunction   . History of asbestos exposure   . Nocturia   . History of shingles   . Dermatitis, atopic   . OA  (osteoarthritis) of hip   . Arthritis   . Frequency of urination   . Lumbar scoliosis   . Malignant neoplasm of prostate ([place]) 06/07/2011  . 84 year old gentleman with stage T3 adenocarcinoma prostate with Gleason score 4+4 and PSA of 10.3 05/11/2011    Waunita Schooner SPT 04/07/2017, 4:35 PM  [facility] [address] [place], [place], [address] Phone: [phone]   Fax:  [phone]  Name: SADIQ MCCAULEY MRN: [id number] Date of Birth: September 12, 1931

## 2017-04-07 NOTE — Therapy (Signed)
[facility] [address] [place], [place], [address] Phone: [phone]   Fax:  [phone]  Occupational Therapy Treatment  Patient Details  Name: PAZ WINSETT MRN: [id number] Date of Birth: [birth year] Referring Provider: Dr [name]   Encounter Date: 04/07/2017  OT End of Session - 04/07/17 1528    Visit Number  5    Number of Visits  17    Authorization Type  Aetna MCR    OT Start Time  4709    OT Stop Time  1530    OT Time Calculation (min)  45 min    Activity Tolerance  Patient tolerated treatment well    Behavior During Therapy  [facility] for tasks assessed/performed       Past Medical History:  Diagnosis Date  . Anemia   . Atherosclerosis   . Complication of anesthesia    CONFUSION - Pt's family very concerned about this  . DDD (degenerative disc disease)   . Degenerative arthritis   . Dermatitis, atopic ARMS AND LEGS  . Diverticulosis   . Erectile dysfunction   . Frequency of urination   . Glaucoma   . History of asbestos exposure   . History of radiation therapy 8/19-13-10/18/11   prostate, 65 GY  . History of shingles 2012-- BILATERAL EYES--  NO RESIDUAL  . Hyperlipidemia   . Hypertension   . Inguinal hernia   . Lumbar scoliosis   . Nocturia   . OA (osteoarthritis) of hip RIGHT  . Prostate cancer ([place]) 05/11/2011   bx=Adenocarcinoma,gleason3+4=7, 4+4=8,PSA=10.30volume=45.7cc  . Renal cyst    bilateral  . Smokers' cough ([place])   . Thyroid cyst     Past Surgical History:  Procedure Laterality Date  . ATTEMPTED LEFT VATS/ LEFT THORACOTOMY/ RESECTION OF THE ENORMOUS, PROBABLE BRONCHOGENIC CYST  01-04-2005  DR Arlyce Dice  . CARDIAC CATHETERIZATION  02-24-2009  DR NASHER   MINOR CORONARY ARTERY IRREGULARITIES/ NORMAL LVSF/ EF 65-70%  . CATARACT EXTRACTION    . COLONOSCOPY W/ POLYPECTOMY    . CYSTOSCOPY  11/15/2011   Procedure: CYSTOSCOPY;  Surgeon: Dutch Gray, MD;  [facility]  Service: Urology;  Laterality: N/A;  no seeds seen in bladder  . esophageus cyst removal  YRS AGO  . [place]  . PROSTATE BIOPSY  05/11/11   Adenocarcinoma (MD OFFICE)  . RADIOACTIVE SEED IMPLANT  11/15/2011   Procedure: RADIOACTIVE SEED IMPLANT;  Surgeon: Dutch Gray, MD;  Location: [facility];  Service: Urology;  Laterality: N/A;  Total number of seeds - 52  . REPAIR RIGHT INGUINAL HERNIA W/ MESH  09-10-1999  . TOTAL HIP ARTHROPLASTY Right 03/20/2013   Procedure: RIGHT TOTAL HIP ARTHROPLASTY ANTERIOR APPROACH;  Surgeon: [name], MD;  Location: [facility];  Service: Orthopedics;  Laterality: Right;  . TOTAL HIP REVISION Right 05/14/2013   Procedure: open reduction internal fixation REVISION RIGHT HIP ;  Surgeon: [name], MD;  Location: [facility];  Service: Orthopedics;  Laterality: Right;  . UMBILICAL HERNIA REPAIR  1998   epigastric    There were no vitals filed for this visit.  Subjective Assessment - 04/07/17 1453    Subjective   I get my glasses tomorrow    Pertinent History  TIA'S, most recent 02/2017, 3 year history of dizziness, decreased baalnce, orthostatic hypotension, recent cataract extraction - 2 weeks ago    Currently in Pain?  No/denies  OT Treatments/Exercises (OP) - 04/07/17 0001      ADLs   Cooking  Pt making fried egg but required mod to max v.c's for safety. Pt turned on incorrect stove eye (d/t low vision) however, also did not recognize when it was time to turn stove down and grease was popping up. Pt also required cues to turn stove off when done. Pt required corner of countertop to balance when using both hands. Pt also required cues to anticipate, plan ahead, and move hand away from stove (secondary to getting too close). Pt initially thought eggs were in cabinet      Hand Exercises   Other Hand Exercises  Pt placing medium sized pegs in pegboard Rt hand, then Lt hand with mod cueing to  correctly copy design (not all d/t vision)                OT Short Term Goals - 04/05/17 1556      OT SHORT TERM GOAL #1   Title  Patient will don front opening shirt / jacket in standing independently (due 3/20)    Status  Achieved      OT SHORT TERM GOAL #2   Title  Patient will demonstrate reduced time on 9 hole peg test by 5 seconds to improve speed with daily dressing tasks.    Baseline  3/12- right 43.25, left 38.34     Status  Achieved      OT SHORT TERM GOAL #3   Title  Patient will use stovetop to cook simple meal item- e.g. fry an egg with close supervision, verbal cueing.      Status  On-going      OT SHORT TERM GOAL #4   Title  Patient will complete a home exercise program designed to improve coordiantion in his hands.      Status  On-going      OT SHORT TERM GOAL #5   Title  Patient will utilize compensatory strategy to effectively utilize pill box for am and pm medications with supervision.      Status  On-going Patient reports improvement- would like to verify with family member        OT Long Term Goals - 03/30/17 1504      OT LONG TERM GOAL #1   Title  Patient will effectively use simple hand tools in standing (screw driver, wrench, hammer) with supervision in preparation for returning to shop (due 4/19)    Time  8    Period  Weeks    Status  New      OT LONG TERM GOAL #2   Title  Patient will cook a simple familiar hot  meal using stovetop and/or oven- at least two items without assistance    Time  8    Period  Weeks    Status  New      OT LONG TERM GOAL #3   Title  Patient will reduce speed on 9 hole peg test by 10 seconds to improve functional coordination during ADL/IADL- tool use.      Baseline  Initial score on 2/18- right 50 seconds, left 47 seconds    Time  8    Period  Weeks    Status  New      OT LONG TERM GOAL #4   Title  Patient will demonstrate adequate environmental visual scanning and functional mobility to safely navigate  through crowded hallway, or busy retail store with supervision.  Time  8    Period  Weeks    Status  New      OT LONG TERM GOAL #5   Title  Patient will return to community exercise program with assistance from family member    Time  8    Period  Weeks    Status  New            Plan - 04/07/17 1529    Clinical Impression Statement  Pt unsafe with simple cooking tasks without mod cues/close supervision. Pt attributes most of his deficits to low vision, but pt demo cognitive deficits as well    Occupational Profile and client history currently impacting functional performance  Patient is married - wife has dementia, father - 4 children live nearby, retired Cabin [employer], used to exercise at least 3 days/week - walking class, and water exercises, patient enjoys outdoor work, and working on cars in his shop.      Rehab Potential  Good    OT Frequency  2x / week    OT Duration  8 weeks    OT Treatment/Interventions  Self-care/ADL training;Energy conservation;Visual/perceptual remediation/compensation;Aquatic Therapy;DME and/or AE instruction;Patient/family education;Balance training;Functional Furniture conservator/restorer;Therapeutic exercise;Therapeutic activities;Neuromuscular education    Plan  consider [employer] if daughter does not continue to live with pt. Consider low vision eval if glasses do not help. Continue to work on safety with ADLS/standing tasks       Patient will benefit from skilled therapeutic intervention in order to improve the following deficits and impairments:  Impaired vision/preception, Improper body mechanics, Impaired perceived functional ability, Decreased activity tolerance, Decreased knowledge of use of DME, Decreased strength, Impaired sensation, Decreased mobility, Decreased balance, Decreased cognition, Decreased safety awareness  Visit Diagnosis: Unsteadiness on feet  Other lack of coordination  Cognitive social or emotional deficit following other  cerebrovascular disease  Visuospatial deficit    Problem List Patient Active Problem List   Diagnosis Date Noted  . Axonal neuropathy 03/21/2017  . Right sided weakness 03/01/2017  . Cigarette smoker 06/16/2016  . Dyspnea on exertion 06/15/2016  . Alzheimer disease 01/07/2014  . CSA (central sleep apnea) 10/05/2013  . Syncope 06/26/2013  . COPD GOLD II 06/25/2013  . BPH (benign prostatic hyperplasia) 05/18/2013  . Glaucoma 05/18/2013  . S/P right TH revision 05/14/2013  . Constipation 03/26/2013  . Osteoporosis 03/26/2013  . Expected blood loss anemia 03/23/2013  . S/P right THA, AA 03/20/2013  . Hyperlipidemia   . Erectile dysfunction   . History of asbestos exposure   . Nocturia   . History of shingles   . Dermatitis, atopic   . OA (osteoarthritis) of hip   . Arthritis   . Frequency of urination   . Lumbar scoliosis   . Malignant neoplasm of prostate ([place]) 06/07/2011  . 84 year old gentleman with stage T3 adenocarcinoma prostate with Gleason score 4+4 and PSA of 10.3 05/11/2011    Carey Bullocks, OTR/L 04/07/2017, 4:48 PM  [facility] [address] [place], [place], [address] Phone: [phone]   Fax:  [phone]  Name: TUFF CLABO MRN: [id number] Date of Birth: 1931-08-21

## 2017-04-07 NOTE — Patient Instructions (Signed)
Tandem Walking     !!!! KEEPING HAND ON COUNTER  TO KEEP YOUR BALANCE!!!!!!!!!!!!!! 3 TIMES 1X DAY   Walk with each foot directly in front of other, heel of one foot touching toes of other foot with each step. Both feet straight ahead.   Copyright  VHI. All rights reserved.  Feet Together (Compliant Surface) Head Motion - Eyes Closed    Stand on groundwith feet together. Close eyes and move head slowly, up and down. Repeat __3 FOR 5 TURNS__ times per session. Do __1__ sessions per day.  Copyright  VHI. All rights reserved.  Feet Together, Head Motion - Eyes Open    With eyes open, feet together, move head slowly: up and down. Repeat _3 TIMES AND 5 TURNS___ times per session. Do _1___ sessions per day.  Copyright  VHI. All rights reserved.

## 2017-04-08 ENCOUNTER — Ambulatory Visit: Payer: Medicare HMO

## 2017-04-08 DIAGNOSIS — R471 Dysarthria and anarthria: Secondary | ICD-10-CM

## 2017-04-08 DIAGNOSIS — R41841 Cognitive communication deficit: Secondary | ICD-10-CM

## 2017-04-08 DIAGNOSIS — R2681 Unsteadiness on feet: Secondary | ICD-10-CM | POA: Diagnosis not present

## 2017-04-08 DIAGNOSIS — R1312 Dysphagia, oropharyngeal phase: Secondary | ICD-10-CM

## 2017-04-11 ENCOUNTER — Ambulatory Visit: Payer: Medicare HMO | Admitting: Physical Therapy

## 2017-04-11 ENCOUNTER — Encounter: Payer: Self-pay | Admitting: Physical Therapy

## 2017-04-11 ENCOUNTER — Ambulatory Visit: Payer: Medicare HMO | Admitting: Speech Pathology

## 2017-04-11 DIAGNOSIS — M6281 Muscle weakness (generalized): Secondary | ICD-10-CM

## 2017-04-11 DIAGNOSIS — R1312 Dysphagia, oropharyngeal phase: Secondary | ICD-10-CM

## 2017-04-11 DIAGNOSIS — R42 Dizziness and giddiness: Secondary | ICD-10-CM

## 2017-04-11 DIAGNOSIS — R2681 Unsteadiness on feet: Secondary | ICD-10-CM | POA: Diagnosis not present

## 2017-04-11 DIAGNOSIS — R278 Other lack of coordination: Secondary | ICD-10-CM

## 2017-04-11 DIAGNOSIS — R2689 Other abnormalities of gait and mobility: Secondary | ICD-10-CM

## 2017-04-11 DIAGNOSIS — R262 Difficulty in walking, not elsewhere classified: Secondary | ICD-10-CM

## 2017-04-11 NOTE — Therapy (Signed)
[facility] [address] [place], [place], [address] Phone: [phone]   Fax:  [phone]  Physical Therapy Treatment  Patient Details  Name: Derek Carr MRN: [id number] Date of Birth: [birth year] Referring Provider: Dr [name]   Encounter Date: 04/11/2017  PT End of Session - 04/11/17 1541    Visit Number  4    Number of Visits  17    Date for PT Re-Evaluation  05/13/17    Authorization Type  Aetna Medicare & Generic commercial 2nd    PT Start Time  1445    PT Stop Time  1530    PT Time Calculation (min)  45 min    Equipment Utilized During Treatment  Gait belt    Activity Tolerance  Patient tolerated treatment well    Behavior During Therapy  [facility] for tasks assessed/performed       Past Medical History:  Diagnosis Date  . Anemia   . Atherosclerosis   . Complication of anesthesia    CONFUSION - Pt's family very concerned about this  . DDD (degenerative disc disease)   . Degenerative arthritis   . Dermatitis, atopic ARMS AND LEGS  . Diverticulosis   . Erectile dysfunction   . Frequency of urination   . Glaucoma   . History of asbestos exposure   . History of radiation therapy 8/19-13-10/18/11   prostate, 57 GY  . History of shingles 2012-- BILATERAL EYES--  NO RESIDUAL  . Hyperlipidemia   . Hypertension   . Inguinal hernia   . Lumbar scoliosis   . Nocturia   . OA (osteoarthritis) of hip RIGHT  . Prostate cancer ([place]) 05/11/2011   bx=Adenocarcinoma,gleason3+4=7, 4+4=8,PSA=10.30volume=45.7cc  . Renal cyst    bilateral  . Smokers' cough ([place])   . Thyroid cyst     Past Surgical History:  Procedure Laterality Date  . ATTEMPTED LEFT VATS/ LEFT THORACOTOMY/ RESECTION OF THE ENORMOUS, PROBABLE BRONCHOGENIC CYST  01-04-2005  DR Arlyce Dice  . CARDIAC CATHETERIZATION  02-24-2009  DR NASHER   MINOR CORONARY ARTERY IRREGULARITIES/ NORMAL LVSF/ EF 65-70%  . CATARACT EXTRACTION    . COLONOSCOPY W/  POLYPECTOMY    . CYSTOSCOPY  11/15/2011   Procedure: CYSTOSCOPY;  Surgeon: Dutch Gray, MD;  Location[facility];  Service: Urology;  Laterality: N/A;  no seeds seen in bladder  . esophageus cyst removal  YRS AGO  . [place]  . PROSTATE BIOPSY  05/11/11   Adenocarcinoma (MD OFFICE)  . RADIOACTIVE SEED IMPLANT  11/15/2011   Procedure: RADIOACTIVE SEED IMPLANT;  Surgeon: Dutch Gray, MD;  [facility];  Service: Urology;  Laterality: N/A;  Total number of seeds - 52  . REPAIR RIGHT INGUINAL HERNIA W/ MESH  09-10-1999  . TOTAL HIP ARTHROPLASTY Right 03/20/2013   Procedure: RIGHT TOTAL HIP ARTHROPLASTY ANTERIOR APPROACH;  Surgeon: [name], MD;  [facility];  Service: Orthopedics;  Laterality: Right;  . TOTAL HIP REVISION Right 05/14/2013   Procedure: open reduction internal fixation REVISION RIGHT HIP ;  Surgeon: [name], MD;  Location: [facility];  Service: Orthopedics;  Laterality: Right;  . UMBILICAL HERNIA REPAIR  1998   epigastric    There were no vitals filed for this visit.  Subjective Assessment - 04/11/17 1449    Subjective  Pt reports no falls since last PT session pt reports he has continued to do his HEP. Pt reports he did not get  his glasses yet.     Patient is accompained by:  Family member    Pertinent History  TIA, DDD, Lumbar scoliosis, Glaucoma, cataract surgery, HTN, right THR 03/20/13 with revision 05/14/13, right femur fracture    Limitations  Standing;Walking;House hold activities    Patient Stated Goals  Patient wants to improve his balance & walking    Currently in Pain?  No/denies         [facility] PT Assessment - 04/11/17 1445      Berg Balance Test   Sit to Stand  Able to stand without using hands and stabilize independently    Standing Unsupported  Able to stand safely 2 minutes    Sitting with Back Unsupported but Feet Supported on Floor or Stool  Able to sit safely and securely 2 minutes     Stand to Sit  Sits safely with minimal use of hands    Transfers  Able to transfer safely, minor use of hands    Standing Unsupported with Eyes Closed  Able to stand 10 seconds with supervision    Standing Ubsupported with Feet Together  Able to place feet together independently and stand for 1 minute with supervision    From Standing, Reach Forward with Outstretched Arm  Can reach confidently >25 cm (10")    From Standing Position, Pick up Object from Floor  Able to pick up shoe, needs supervision    From Standing Position, Turn to Look Behind Over each Shoulder  Looks behind one side only/other side shows less weight shift    Turn 360 Degrees  Needs close supervision or verbal cueing    Standing Unsupported, Alternately Place Feet on Step/Stool  Able to complete >2 steps/needs minimal assist    Standing Unsupported, One Foot in Front  Able to take small step independently and hold 30 seconds    Standing on One Leg  Tries to lift leg/unable to hold 3 seconds but remains standing independently    Total Score  41    Berg comment:  initial 24/56                  [facility] Adult PT Treatment/Exercise - 04/11/17 1445      Transfers   Transfers  Stand to Sit;Sit to Stand    Sit to Stand  5: Supervision;From chair/3-in-1;With armrests;Without upper extremity assist;With upper extremity assist    Stand to Sit  6: Modified independent (Device/Increase time);To chair/3-in-1;With upper extremity assist;Without upper extremity assist;With armrests      Ambulation/Gait   Ambulation/Gait  Yes    Ambulation/Gait Assistance  5: Supervision    Ambulation/Gait Assistance Details  Neuro re-ed: scanning task    Ambulation Distance (Feet)  220 Feet 69f    Assistive device  Straight cane quad tip     Gait Pattern  Step-through pattern;Decreased arm swing - right;Decreased step length - right;Decreased step length - left;Decreased stride length;Decreased dorsiflexion - right;Trunk flexed;Poor foot  clearance - right;Poor foot clearance - left;Decreased stance time - right;Decreased hip/knee flexion - right    Ambulation Surface  Level;Indoor    Ramp  5: Supervision    Ramp Details (indicate cue type and reason)  no verbal cues for pattern    Curb  5: Supervision    Curb Details (indicate cue type and reason)  no verbal or tactile cues     Gait Comments  verbal cues for scanning task but no LOB      Exercises   Exercises  --  Nu-step 5 min          Balance Exercises - 04/11/17 1445      Balance Exercises: Standing   Standing Eyes Opened  Narrow base of support (BOS);Head turns;Solid surface;3 reps;30 secs    Standing Eyes Closed  Narrow base of support (BOS);Solid surface;3 reps;30 secs    Tandem Gait  Forward;Upper extremity support;2 reps 10 feet         PT Education - 04/11/17 1540    Education provided  Yes    Education Details  HEP review/ adjustments. falls risk     Person(s) Educated  Patient    Methods  Explanation;Demonstration;Handout;Tactile cues    Comprehension  Verbalized understanding       PT Short Term Goals - 04/11/17 1547      PT SHORT TERM GOAL #1   Title  Pt will be independent with initial HEP exercises (All STGs Target Date 04/13/2017)    Baseline  MET(3/18) with the use of his print out    Time  1    Period  Months    Status  Achieved      PT SHORT TERM GOAL #2   Title  Pt will demonstrate ramp & Curb navigation Minimal Guard to improve confidence and safety ambulating in home and community environment     Baseline  MET(3/18) ramps and curbs Supervision     Time  1    Period  Months    Status  Achieved      PT SHORT TERM GOAL #3   Title  Patient ambuates with LRAD scanning environment with no balance loss.     Baseline  MET(3/18) 200+ ambulation with scanning task with no LOB  and mild reduction in gait speed    Time  1    Period  Months    Status  Achieved      PT SHORT TERM GOAL #4   Title  Berg Balance >/= 30/56    Baseline   MET(3/18) Berg Balance 41/56, inital 24/56    Time  1    Period  Months    Status  Achieved      PT SHORT TERM GOAL #5   Title  Patient ambulates 200' with LRAD with supervision.     Baseline  MET(3/18) pt walked 200+ feet without rest breaks and with the [facility] with quad tip    Time  1    Period  Months    Status  Achieved        PT Long Term Goals - 04/11/17 1636      PT LONG TERM GOAL #1   Title  Pt will demonstrate independence with ongoing HEP program and fitness plan (All LTGs Target Date 05/13/2017)    Time  2    Period  Months    Status  On-going    Target Date  05/13/17      PT LONG TERM GOAL #2   Title  Pt will demonstrate Mod I 500' ambulation outdoors including grass, pavement, curbs, ramps with LRAD to improve community ambulatory skills    Time  2    Period  Months    Status  On-going    Target Date  05/13/17      PT LONG TERM GOAL #3   Title  Pt will demonstrate Berg > 45/56 in order to reduce falls risk & indicate less dependency in ADLs.    Baseline  Berg 24/56 (03/14/17),  41/56 on 04/11/2017  Time  2    Period  Months    Status  Revised    Target Date  05/13/17      PT LONG TERM GOAL #4   Title  Patient performs Timed Up-Go time with cane <17 seconds to indicate lower fall risk.     Baseline  With cane 21.72 without cane 19.75(2/18)    Time  2    Period  Months    Status  On-going    Target Date  05/13/17      PT LONG TERM GOAL #5   Title  Pt will negotiate 2 stairs without Rail assist with LRAD with family supervision to improve safety when entering/ exiting home.     Time  2    Period  Months    Status  On-going    Target Date  05/13/17            Plan - 04/11/17 1542    Clinical Impression Statement  During today's PT session pt demonstrated his progression in balance and muscular endurance by meeting all 5 of his short term goals. Completing the BERG balance assessment with a score of 41/56 (initial evaluation 24/56), as well as dual  task gait (scanning) without loss of balance. Patient required the use of his print out for reminders of his HEP exercise. Patient continues to demonstrate falls risk and will continue to benefit from skilled PT intervention to address issues noted above.    Rehab Potential  Good    Clinical Impairments Affecting Rehab Potential  progressive decline over 2 years, good attitude and family support with 4 children in area.     PT Frequency  2x / week    PT Duration  Other (comment)    PT Treatment/Interventions  Gait training;Functional mobility training;Neuromuscular re-education;Balance training;Therapeutic exercise;Therapeutic activities;Patient/family education;ADLs/Self Care Home Management;Stair training;Orthotic Fit/Training;Manual techniques;Vestibular    PT Next Visit Plan  Continue to progress patients dual task gait skills as well as other high level balance activates in order to continue to progress towards LTGs.    Consulted and Agree with Plan of Care  Patient       Patient will benefit from skilled therapeutic intervention in order to improve the following deficits and impairments:  Abnormal gait, Decreased balance, Decreased endurance, Decreased mobility, Difficulty walking, Decreased activity tolerance, Decreased strength, Decreased safety awareness  Visit Diagnosis: Unsteadiness on feet  Other lack of coordination  Muscle weakness (generalized)  Dizziness and giddiness  Difficulty in walking, not elsewhere classified  Other abnormalities of gait and mobility     Problem List Patient Active Problem List   Diagnosis Date Noted  . Axonal neuropathy 03/21/2017  . Right sided weakness 03/01/2017  . Cigarette smoker 06/16/2016  . Dyspnea on exertion 06/15/2016  . Alzheimer disease 01/07/2014  . CSA (central sleep apnea) 10/05/2013  . Syncope 06/26/2013  . COPD GOLD II 06/25/2013  . BPH (benign prostatic hyperplasia) 05/18/2013  . Glaucoma 05/18/2013  . S/P right TH  revision 05/14/2013  . Constipation 03/26/2013  . Osteoporosis 03/26/2013  . Expected blood loss anemia 03/23/2013  . S/P right THA, AA 03/20/2013  . Hyperlipidemia   . Erectile dysfunction   . History of asbestos exposure   . Nocturia   . History of shingles   . Dermatitis, atopic   . OA (osteoarthritis) of hip   . Arthritis   . Frequency of urination   . Lumbar scoliosis   . Malignant neoplasm of prostate ([place]) 06/07/2011  .  84 year old gentleman with stage T3 adenocarcinoma prostate with Gleason score 4+4 and PSA of 10.3 05/11/2011   Waunita Schooner SPT 04/11/2017, 4:27 PM  Jamey Reas PT, DPT 04/11/2017, 4:37 PM  [facility] [address] [place], [place], [address] Phone: [phone]   Fax:  [phone]  Name: NDREW CREASON MRN: [id number] Date of Birth: September 07, 1931

## 2017-04-11 NOTE — Therapy (Signed)
[facility] [address] [place], [place], [address] Phone: [phone]   Fax:  [phone]  Speech Language Pathology Treatment  Patient Details  Name: Derek Carr MRN: [id number] Date of Birth: [birth year] Referring Provider: [name], MD   Encounter Date: 04/11/2017  End of Session - 04/11/17 1713    Visit Number  3    Number of Visits  17    Date for SLP Re-Evaluation  05/20/17    SLP Start Time  83    SLP Stop Time   1611    SLP Time Calculation (min)  37 min    Activity Tolerance  Patient tolerated treatment well       Past Medical History:  Diagnosis Date  . Anemia   . Atherosclerosis   . Complication of anesthesia    CONFUSION - Pt's family very concerned about this  . DDD (degenerative disc disease)   . Degenerative arthritis   . Dermatitis, atopic ARMS AND LEGS  . Diverticulosis   . Erectile dysfunction   . Frequency of urination   . Glaucoma   . History of asbestos exposure   . History of radiation therapy 8/19-13-10/18/11   prostate, 35 GY  . History of shingles 2012-- BILATERAL EYES--  NO RESIDUAL  . Hyperlipidemia   . Hypertension   . Inguinal hernia   . Lumbar scoliosis   . Nocturia   . OA (osteoarthritis) of hip RIGHT  . Prostate cancer ([place]) 05/11/2011   bx=Adenocarcinoma,gleason3+4=7, 4+4=8,PSA=10.30volume=45.7cc  . Renal cyst    bilateral  . Smokers' cough ([place])   . Thyroid cyst     Past Surgical History:  Procedure Laterality Date  . ATTEMPTED LEFT VATS/ LEFT THORACOTOMY/ RESECTION OF THE ENORMOUS, PROBABLE BRONCHOGENIC CYST  01-04-2005  DR Arlyce Dice  . CARDIAC CATHETERIZATION  02-24-2009  DR NASHER   MINOR CORONARY ARTERY IRREGULARITIES/ NORMAL LVSF/ EF 65-70%  . CATARACT EXTRACTION    . COLONOSCOPY W/ POLYPECTOMY    . CYSTOSCOPY  11/15/2011   Procedure: CYSTOSCOPY;  Surgeon: Dutch Gray, MD;  Location: [facility];  Service: Urology;  Laterality: N/A;   no seeds seen in bladder  . esophageus cyst removal  YRS AGO  . [place]  . PROSTATE BIOPSY  05/11/11   Adenocarcinoma (MD OFFICE)  . RADIOACTIVE SEED IMPLANT  11/15/2011   Procedure: RADIOACTIVE SEED IMPLANT;  Surgeon: Dutch Gray, MD;  [facility];  Service: Urology;  Laterality: N/A;  Total number of seeds - 52  . REPAIR RIGHT INGUINAL HERNIA W/ MESH  09-10-1999  . TOTAL HIP ARTHROPLASTY Right 03/20/2013   Procedure: RIGHT TOTAL HIP ARTHROPLASTY ANTERIOR APPROACH;  Surgeon: [name], MD;  Location: [facility];  Service: Orthopedics;  Laterality: Right;  . TOTAL HIP REVISION Right 05/14/2013   Procedure: open reduction internal fixation REVISION RIGHT HIP ;  Surgeon: [name], MD;  Location: [facility];  Service: Orthopedics;  Laterality: Right;  . UMBILICAL HERNIA REPAIR  1998   epigastric    There were no vitals filed for this visit.  Subjective Assessment - 04/11/17 1537    Subjective  "I've been trying to swallow with my mouth open."    Currently in Pain?  No/denies            ADULT SLP TREATMENT - 04/11/17 1538      General Information   Behavior/Cognition  Alert;Cooperative;Pleasant mood    Patient Positioning  Upright in  chair      Treatment Provided   Treatment provided  Dysphagia      Dysphagia Treatment   Temperature Spikes Noted  No    Respiratory Status  Room air    Treatment Methods  Skilled observation;Therapeutic exercise;Compensation strategy training;Patient/caregiver education    Patient observed directly with PO's  Yes    Type of PO's observed  Thin liquids    Feeding  Able to feed self    Liquids provided via  Cup    Pharyngeal Phase Signs & Symptoms  Wet vocal quality;Delayed throat clear    Type of cueing  Verbal    Amount of cueing  Maximal    Other treatment/comments  Pt arrived alone again to ST. He told SLP 2 swallowing precautions independently (small bites/sips, avoid mixed consistencies), mod A  required for clearing throat and multiple swallows. SLP provided skilled feedback as pt attempted swallowing exercises. Max A required throughout, particularly for persisting with repetitions (pt apt to complete 1-2 repetitions before distraction). SLP used mirror to increase pt's awareness and provide visual feedback. Initially when SLP verbally instructed pt through effortful swallow, he misinterpreted "squeeze your throat muscles" despite visual demonstration, placing his hand to his throat. With max A, pt completed 10 repetitions of effortful swallow. Pt unable to sequence/coordinate Mendelsohn or Masako maneuvers. Chin tuck against resistance was completed, with SLP providing resistance and cuing for jaw opening. After several repetitions, transitioned to pt using his own fist for resistance; usual mod A required for effort and technique (pt periodically adding steps from unrelated exercises). SLP used video of pt's MBS to demonstrate residue in his throat and to explain rationale for multiple swallows per sip of liquid. Pt able to state, "swallow 2 or 3 times" as a strategy to use prior to taking sips of liquid, however max A required for carryover of this while drinking water. SLP cued pt to "Sip" and then count his swallows, though cues required throughout to initiate. SLP trained pt in loud /a/, during which he required mod verbal, demonstration and visual cues to maintain average in mid 80s at 30 cm. During session, vocal quality was intermittently wet; frequent cues required to clear throat, reswallow for improved vocal quality.       Pain Assessment   Pain Assessment  No/denies pain      Assessment / Recommendations / Plan   Plan  Continue with current plan of care      Progression Toward Goals   Progression toward goals  Progressing toward goals       SLP Education - 04/11/17 1727    Education provided  Yes    Education Details  loud /a/, clear throat when voice sounds "wet"    Person(s)  Educated  Patient    Methods  Explanation;Demonstration;Verbal cues    Comprehension  Verbalized understanding;Need further instruction;Verbal cues required;Returned demonstration       SLP Short Term Goals - 04/11/17 1729      SLP SHORT TERM GOAL #1   Title  pt will complete HEP for dysarthria and dysphagia with occasional min A over three sessions    Time  4    Period  Weeks    Status  On-going      SLP SHORT TERM GOAL #2   Title  pt will follow swallow precautions with POs during session with rare min A over two sessions    Time  4    Period  Weeks    Status  On-going      SLP SHORT TERM GOAL #3   Title  pt/family will acknowledge knowledge of 3 overt s/s aspiration PNA    Time  4    Period  Weeks    Status  On-going      SLP SHORT TERM GOAL #4   Title  pt will generate loud /a/ at average low-mid 80s over 3 sessions    Time  4    Period  Weeks    Status  On-going       SLP Long Term Goals - 04/11/17 1729      SLP LONG TERM GOAL #1   Title  pt will demo swallow and oral-motor HEP with rare min A over two sessions    Time  8    Period  Weeks    Status  On-going      SLP LONG TERM GOAL #2   Title  pt will complete loud /a/ with average low to mid 80s dB over 5 sessions    Time  8    Period  Weeks    Status  On-going      SLP LONG TERM GOAL #3   Title  pt will use average speech loudness of low 70s dB in 5 minutes simple conversation over two sessions    Time  8    Period  Weeks    Status  On-going       Plan - 04/11/17 1728    Clinical Impression Statement  Arrived alone to ST today. Pt continues with swallowing deficits, cognitive communication deficits. Pt spoke today near- WNL level so SLP to monitor speech loudness in next few sessions. Cognitive linguistic deficits negatively affecting his ability to complete HEP for swallowing and his ability to recall compensations for POs in session today. Pt states daughter is helping pt with HEP as well as following  swallowing/aspiration precautions, however unsure due to pt memory deficits. He would benefit from skilled ST to address swallowing and dysarthria for safety with POs as well as ease of communication.     Speech Therapy Frequency  2x / week    Treatment/Interventions  Aspiration precaution training;Pharyngeal strengthening exercises;Diet toleration management by SLP;Internal/external aids;Patient/family education;Compensatory strategies;SLP instruction and feedback;Oral motor exercises;Cueing hierarchy;Environmental controls;Cognitive reorganization    Potential to Achieve Goals  Fair    Potential Considerations  Previous level of function;Ability to learn/carryover information    Consulted and Agree with Plan of Care  Patient       Patient will benefit from skilled therapeutic intervention in order to improve the following deficits and impairments:   Dysphagia, oropharyngeal    Problem List Patient Active Problem List   Diagnosis Date Noted  . Axonal neuropathy 03/21/2017  . Right sided weakness 03/01/2017  . Cigarette smoker 06/16/2016  . Dyspnea on exertion 06/15/2016  . Alzheimer disease 01/07/2014  . CSA (central sleep apnea) 10/05/2013  . Syncope 06/26/2013  . COPD GOLD II 06/25/2013  . BPH (benign prostatic hyperplasia) 05/18/2013  . Glaucoma 05/18/2013  . S/P right TH revision 05/14/2013  . Constipation 03/26/2013  . Osteoporosis 03/26/2013  . Expected blood loss anemia 03/23/2013  . S/P right THA, AA 03/20/2013  . Hyperlipidemia   . Erectile dysfunction   . History of asbestos exposure   . Nocturia   . History of shingles   . Dermatitis, atopic   . OA (osteoarthritis) of hip   . Arthritis   . Frequency of urination   . Lumbar  scoliosis   . Malignant neoplasm of prostate ([place]) 06/07/2011  . 84 year old gentleman with stage T3 adenocarcinoma prostate with Gleason score 4+4 and PSA of 10.3 05/11/2011   Deneise Lever, MS, [place] 04/11/2017, 5:30 PM  [facility] [address] [place], [place], [address] Phone: [phone]   Fax:  [phone]   Name: Derek Carr MRN: [id number] Date of Birth: [birth year]

## 2017-04-11 NOTE — Patient Instructions (Signed)
Tandem Walking   !!!!!!!!!!!!!!!!!!!!! KEEP HANDS ON COUNTER!!!!!!!  Walk with each foot directly in front of other, heel of one foot touching toes of other foot with each step. Both feet straight ahead.   Copyright  VHI. All rights reserved.  Feet Together, Head Motion - Eyes Open    With eyes open, feet together, move head slowly: up and down. Repeat _5 each secounds_ times per session. Do _1___ sessions per day.  Copyright  VHI. All rights reserved.  Feet Together, Head Motion - Eyes Closed    With eyes closed and feet together, move head slowly, up and down. Repeat __3x10 secounds each__ times per session. Do _1 ___ sessions per day.  Copyright  VHI. All rights reserved.

## 2017-04-11 NOTE — Therapy (Signed)
[facility] [address] [place], [place], [address] Phone: [phone]   Fax:  [phone]  Speech Language Pathology Treatment  Patient Details  Name: Derek Carr MRN: [id number] Date of Birth: Aug 07, 1931 Referring Provider: [name], MD   Encounter Date: 04/08/2017  End of Session - 04/11/17 1032    Visit Number  2    Number of Visits  17    Date for SLP Re-Evaluation  05/20/17    SLP Start Time  82    SLP Stop Time   1613    SLP Time Calculation (min)  39 min    Activity Tolerance  -- limited by cognitive communication deficits       Past Medical History:  Diagnosis Date  . Anemia   . Atherosclerosis   . Complication of anesthesia    CONFUSION - Pt's family very concerned about this  . DDD (degenerative disc disease)   . Degenerative arthritis   . Dermatitis, atopic ARMS AND LEGS  . Diverticulosis   . Erectile dysfunction   . Frequency of urination   . Glaucoma   . History of asbestos exposure   . History of radiation therapy 8/19-13-10/18/11   prostate, 17 GY  . History of shingles 2012-- BILATERAL EYES--  NO RESIDUAL  . Hyperlipidemia   . Hypertension   . Inguinal hernia   . Lumbar scoliosis   . Nocturia   . OA (osteoarthritis) of hip RIGHT  . Prostate cancer ([place]) 05/11/2011   bx=Adenocarcinoma,gleason3+4=7, 4+4=8,PSA=10.30volume=45.7cc  . Renal cyst    bilateral  . Smokers' cough ([place])   . Thyroid cyst     Past Surgical History:  Procedure Laterality Date  . ATTEMPTED LEFT VATS/ LEFT THORACOTOMY/ RESECTION OF THE ENORMOUS, PROBABLE BRONCHOGENIC CYST  01-04-2005  DR Arlyce Dice  . CARDIAC CATHETERIZATION  02-24-2009  DR NASHER   MINOR CORONARY ARTERY IRREGULARITIES/ NORMAL LVSF/ EF 65-70%  . CATARACT EXTRACTION    . COLONOSCOPY W/ POLYPECTOMY    . CYSTOSCOPY  11/15/2011   Procedure: CYSTOSCOPY;  Surgeon: Dutch Gray, MD;  Location: [facility];  Service: Urology;   Laterality: N/A;  no seeds seen in bladder  . esophageus cyst removal  YRS AGO  . [place]  . PROSTATE BIOPSY  05/11/11   Adenocarcinoma (MD OFFICE)  . RADIOACTIVE SEED IMPLANT  11/15/2011   Procedure: RADIOACTIVE SEED IMPLANT;  Surgeon: Dutch Gray, MD;  Location: [facility];  Service: Urology;  Laterality: N/A;  Total number of seeds - 52  . REPAIR RIGHT INGUINAL HERNIA W/ MESH  09-10-1999  . TOTAL HIP ARTHROPLASTY Right 03/20/2013   Procedure: RIGHT TOTAL HIP ARTHROPLASTY ANTERIOR APPROACH;  Surgeon: [name], MD;  Location: [facility];  Service: Orthopedics;  Laterality: Right;  . TOTAL HIP REVISION Right 05/14/2013   Procedure: open reduction internal fixation REVISION RIGHT HIP ;  Surgeon: [name], MD;  Location: [facility];  Service: Orthopedics;  Laterality: Right;  . UMBILICAL HERNIA REPAIR  1998   epigastric    There were no vitals filed for this visit.         ADULT SLP TREATMENT - 04/11/17 0001      General Information   Behavior/Cognition  Alert;Cooperative;Pleasant mood      Treatment Provided   Treatment provided  Dysphagia      Dysphagia Treatment   Temperature Spikes Noted  No    Amount of cueing  Maximal  Other treatment/comments  Pt arrived alone to ST. Max cues usually needed for swallow precautions; pt constantly asking if he can take another bite, without taking a second swallow. Pt stated he didn't bring glasses with him or SLP would have written out precautions and put them on table in front of pt. SLP asked pt if daughter is assisting with HEP and at mealtimes and pt confirmed that she is, however pt is very likely poor historian due to memory deficits. Pt volume was generally louder today, approaching if not (subjectively) at [facility].      Assessment / Recommendations / Plan   Plan  Continue with current plan of care      Progression Toward Goals   Progression toward goals  Not progressing toward goals (comment)  decr'd attention/memory hindering pt's progress today       SLP Education - 04/11/17 1031    Education provided  Yes    Education Details  HEP x2/day, swallowing precautions    Person(s) Educated  Patient    Methods  Explanation;Demonstration;Verbal cues    Comprehension  Verbal cues required;Need further instruction       SLP Short Term Goals - 04/08/17 1543      SLP SHORT TERM GOAL #1   Title  pt will complete HEP for dysarthria and dysphagia with occasional min A over three sessions    Time  4    Period  Weeks    Status  On-going      SLP SHORT TERM GOAL #2   Title  pt will follow swallow precautions with POs during session with rare min A over two sessions    Time  4    Period  Weeks    Status  On-going      SLP SHORT TERM GOAL #3   Title  pt/family will acknowledge knowledge of 3 overt s/s aspiration PNA    Time  4    Period  Weeks    Status  On-going      SLP SHORT TERM GOAL #4   Title  pt will generate loud /a/ at average low-mid 80s over 3 sessions    Time  4    Period  Weeks    Status  On-going       SLP Long Term Goals - 04/11/17 1037      SLP LONG TERM GOAL #1   Title  pt will demo swallow and oral-motor HEP with rare min A over two sessions    Time  8    Period  Weeks    Status  On-going      SLP LONG TERM GOAL #2   Title  pt will complete loud /a/ with average low to mid 80s dB over 5 sessions    Time  8    Period  Weeks    Status  On-going      SLP LONG TERM GOAL #3   Title  pt will use average speech loudness of low 70s dB in 5 minutes simple conversation over two sessions    Time  8    Period  Weeks    Status  On-going       Plan - 04/11/17 1033    Clinical Impression Statement  Arrived alone to ST today. Pt continues with swallowing deficits, cognitive communication deficits. Pt spoke today near- WNL level so SLP to monitor speech loudness in next few sessions. Cognitive linguistic deficits negatively affecting his ability to complete  HEP for  swallowing and his ability to recall compensations for POs in session today. Pt states daughter is helping pt with HEP as well as following swallowing/aspiration precautions, however unsure due to pt memory deficits. He would benefit from skilled ST to address swallowing and dysarthria for safety with POs as well as ease of communication.     Speech Therapy Frequency  2x / week    Duration  -- 8 weeks - 17 total sessions    Treatment/Interventions  Aspiration precaution training;Pharyngeal strengthening exercises;Diet toleration management by SLP;Internal/external aids;Patient/family education;Compensatory strategies;SLP instruction and feedback;Oral motor exercises;Cueing hierarchy;Environmental controls;Cognitive reorganization    Potential to Achieve Goals  Fair    Potential Considerations  Previous level of function;Ability to learn/carryover information    SLP Home Exercise Plan  SLP provided pt with effortful swallow today but has full HEP for dysphagia for pt including lip strengthening for dysarthria    Consulted and Agree with Plan of Care  Patient;Family member/caregiver    Family Member Consulted  daughter       Patient will benefit from skilled therapeutic intervention in order to improve the following deficits and impairments:   Dysphagia, oropharyngeal  Dysarthria and anarthria  Cognitive communication deficit    Problem List Patient Active Problem List   Diagnosis Date Noted  . Axonal neuropathy 03/21/2017  . Right sided weakness 03/01/2017  . Cigarette smoker 06/16/2016  . Dyspnea on exertion 06/15/2016  . Alzheimer disease 01/07/2014  . CSA (central sleep apnea) 10/05/2013  . Syncope 06/26/2013  . COPD GOLD II 06/25/2013  . BPH (benign prostatic hyperplasia) 05/18/2013  . Glaucoma 05/18/2013  . S/P right TH revision 05/14/2013  . Constipation 03/26/2013  . Osteoporosis 03/26/2013  . Expected blood loss anemia 03/23/2013  . S/P right THA, AA 03/20/2013   . Hyperlipidemia   . Erectile dysfunction   . History of asbestos exposure   . Nocturia   . History of shingles   . Dermatitis, atopic   . OA (osteoarthritis) of hip   . Arthritis   . Frequency of urination   . Lumbar scoliosis   . Malignant neoplasm of prostate ([place]) 06/07/2011  . 84 year old gentleman with stage T3 adenocarcinoma prostate with Gleason score 4+4 and PSA of 10.3 05/11/2011    [facility] ,[place], CCC-SLP  04/11/2017, 10:38 AM  [facility] [address] [place], [place], [address] Phone: [phone]   Fax:  [phone]   Name: Derek Carr MRN: [id number] Date of Birth: [birth year]

## 2017-04-12 ENCOUNTER — Ambulatory Visit: Payer: Medicare HMO | Admitting: Occupational Therapy

## 2017-04-12 ENCOUNTER — Ambulatory Visit: Payer: Medicare HMO | Admitting: Speech Pathology

## 2017-04-12 DIAGNOSIS — R2681 Unsteadiness on feet: Secondary | ICD-10-CM | POA: Diagnosis not present

## 2017-04-12 DIAGNOSIS — M6281 Muscle weakness (generalized): Secondary | ICD-10-CM

## 2017-04-12 DIAGNOSIS — R278 Other lack of coordination: Secondary | ICD-10-CM

## 2017-04-12 NOTE — Therapy (Signed)
[facility] [address] [place], [place], [address] Phone: [phone]   Fax:  [phone]  Occupational Therapy Treatment  Patient Details  Name: Derek Carr MRN: [id number] Date of Birth: Dec 03, 1931 Referring Provider: Dr [name]   Encounter Date: 04/12/2017  OT End of Session - 04/12/17 1025    Visit Number  6    Number of Visits  17    Authorization Type  [employer] MCR    OT Start Time  0930    OT Stop Time  1015    OT Time Calculation (min)  45 min    Activity Tolerance  Patient tolerated treatment well    Behavior During Therapy  [facility] for tasks assessed/performed       Past Medical History:  Diagnosis Date  . Anemia   . Atherosclerosis   . Complication of anesthesia    CONFUSION - Pt's family very concerned about this  . DDD (degenerative disc disease)   . Degenerative arthritis   . Dermatitis, atopic ARMS AND LEGS  . Diverticulosis   . Erectile dysfunction   . Frequency of urination   . Glaucoma   . History of asbestos exposure   . History of radiation therapy 8/19-13-10/18/11   prostate, 89 GY  . History of shingles 2012-- BILATERAL EYES--  NO RESIDUAL  . Hyperlipidemia   . Hypertension   . Inguinal hernia   . Lumbar scoliosis   . Nocturia   . OA (osteoarthritis) of hip RIGHT  . Prostate cancer ([place]) 05/11/2011   bx=Adenocarcinoma,gleason3+4=7, 4+4=8,PSA=10.30volume=45.7cc  . Renal cyst    bilateral  . Smokers' cough ([place])   . Thyroid cyst     Past Surgical History:  Procedure Laterality Date  . ATTEMPTED LEFT VATS/ LEFT THORACOTOMY/ RESECTION OF THE ENORMOUS, PROBABLE BRONCHOGENIC CYST  01-04-2005  DR Arlyce Dice  . CARDIAC CATHETERIZATION  02-24-2009  DR NASHER   MINOR CORONARY ARTERY IRREGULARITIES/ NORMAL LVSF/ EF 65-70%  . CATARACT EXTRACTION    . COLONOSCOPY W/ POLYPECTOMY    . CYSTOSCOPY  11/15/2011   Procedure: CYSTOSCOPY;  Surgeon: Dutch Gray, MD;  Location: [facility];  Service: Urology;  Laterality: N/A;  no seeds seen in bladder  . esophageus cyst removal  YRS AGO  . [place]  . PROSTATE BIOPSY  05/11/11   Adenocarcinoma (MD OFFICE)  . RADIOACTIVE SEED IMPLANT  11/15/2011   Procedure: RADIOACTIVE SEED IMPLANT;  Surgeon: Dutch Gray, MD;  Location: [facility];  Service: Urology;  Laterality: N/A;  Total number of seeds - 52  . REPAIR RIGHT INGUINAL HERNIA W/ MESH  09-10-1999  . TOTAL HIP ARTHROPLASTY Right 03/20/2013   Procedure: RIGHT TOTAL HIP ARTHROPLASTY ANTERIOR APPROACH;  Surgeon: [name], MD;  Location: [facility];  Service: Orthopedics;  Laterality: Right;  . TOTAL HIP REVISION Right 05/14/2013   Procedure: open reduction internal fixation REVISION RIGHT HIP ;  Surgeon: [name], MD;  Location: [facility];  Service: Orthopedics;  Laterality: Right;  . UMBILICAL HERNIA REPAIR  1998   epigastric    There were no vitals filed for this visit.  Subjective Assessment - 04/12/17 0936    Subjective   I get my glasses tomorrow    Pertinent History  TIA'S, most recent 02/2017, 3 year history of dizziness, decreased baalnce, orthostatic hypotension, recent cataract extraction - 2 weeks ago    Currently in Pain?  No/denies  OT Treatments/Exercises (OP) - 04/12/17 0001      ADLs   LB Dressing  Simulated donning/doffing pants over hips in standing for balance. Practiced donning/doffing shoes and socks with difficulty and repetition required to tie shoes d/t visual/percetual deficits    Functional Mobility  Standing at countertop to retrieve items from high cabinets requiring wt. shifts onto front of feet. Progressed to retrieving items from low cabinets with one hand counter support for balance. Then retrieving items from mid level shelves with both hands simultaneously. Standing to screw nuts/bolts for fine motor tasks while working dynamic standing balance.     ADL Comments  Pt issued  [employer] information and recommended looking over with daughter       Exercises   Exercises  -- UBE x 5 min. for UB conditioning               OT Short Term Goals - 04/12/17 1015      OT SHORT TERM GOAL #1   Title  Patient will don front opening shirt / jacket in standing independently (due 3/20)    Status  Achieved      OT SHORT TERM GOAL #2   Title  Patient will demonstrate reduced time on 9 hole peg test by 5 seconds to improve speed with daily dressing tasks.    Baseline  3/12- right 43.25, left 38.34     Status  Achieved      OT SHORT TERM GOAL #3   Title  Patient will use stovetop to cook simple meal item- e.g. fry an egg with close supervision, verbal cueing.      Status  Partially Met Also required min assist for safety      OT SHORT TERM GOAL #4   Title  Patient will complete a home exercise program designed to improve coordiantion in his hands.      Status  Achieved      OT SHORT TERM GOAL #5   Title  Patient will utilize compensatory strategy to effectively utilize pill box for am and pm medications with supervision.      Status  On-going Patient reports improvement- would like to verify with family member        OT Long Term Goals - 04/12/17 1016      OT LONG TERM GOAL #1   Title  Patient will effectively use simple hand tools in standing (screw driver, wrench, hammer) with supervision in preparation for returning to shop (due 4/19)    Time  8    Period  Weeks    Status  On-going      OT LONG TERM GOAL #2   Title  Patient will cook a simple familiar hot  meal using stovetop and/or oven- at least two items without assistance    Time  8    Period  Weeks    Status  Not Met Will need mod cues/close sup for safety d/t cognitive deficits and balance      OT LONG TERM GOAL #3   Title  Patient will reduce speed on 9 hole peg test by 10 seconds to improve functional coordination during ADL/IADL- tool use.      Baseline  Initial score on 2/18- right 50  seconds, left 47 seconds    Time  8    Period  Weeks    Status  New      OT LONG TERM GOAL #4   Title  Patient will demonstrate adequate environmental visual scanning  and functional mobility to safely navigate through crowded hallway, or busy retail store with supervision.      Time  8    Period  Weeks    Status  New      OT LONG TERM GOAL #5   Title  Patient will return to community exercise program with assistance from family member    Time  8    Period  Weeks    Status  New            Plan - 04/12/17 1017    Clinical Impression Statement  Pt with no LOB with static standing tasks. Pt does well with one hand support for dynamic standing tasks.     Rehab Potential  Good    OT Frequency  2x / week    OT Duration  8 weeks    OT Treatment/Interventions  Self-care/ADL training;Energy conservation;Visual/perceptual remediation/compensation;Aquatic Therapy;DME and/or AE instruction;Patient/family education;Balance training;Functional Furniture conservator/restorer;Therapeutic exercise;Therapeutic activities;Neuromuscular education    Plan  continue safety with ADLS, standing tasks, simple visual scanning with functional mobility    Consulted and Agree with Plan of Care  Patient       Patient will benefit from skilled therapeutic intervention in order to improve the following deficits and impairments:  Impaired vision/preception, Improper body mechanics, Impaired perceived functional ability, Decreased activity tolerance, Decreased knowledge of use of DME, Decreased strength, Impaired sensation, Decreased mobility, Decreased balance, Decreased cognition, Decreased safety awareness  Visit Diagnosis: Unsteadiness on feet  Other lack of coordination  Muscle weakness (generalized)    Problem List Patient Active Problem List   Diagnosis Date Noted  . Axonal neuropathy 03/21/2017  . Right sided weakness 03/01/2017  . Cigarette smoker 06/16/2016  . Dyspnea on exertion 06/15/2016  .  Alzheimer disease 01/07/2014  . CSA (central sleep apnea) 10/05/2013  . Syncope 06/26/2013  . COPD GOLD II 06/25/2013  . BPH (benign prostatic hyperplasia) 05/18/2013  . Glaucoma 05/18/2013  . S/P right TH revision 05/14/2013  . Constipation 03/26/2013  . Osteoporosis 03/26/2013  . Expected blood loss anemia 03/23/2013  . S/P right THA, AA 03/20/2013  . Hyperlipidemia   . Erectile dysfunction   . History of asbestos exposure   . Nocturia   . History of shingles   . Dermatitis, atopic   . OA (osteoarthritis) of hip   . Arthritis   . Frequency of urination   . Lumbar scoliosis   . Malignant neoplasm of prostate ([place]) 06/07/2011  . 84 year old gentleman with stage T3 adenocarcinoma prostate with Gleason score 4+4 and PSA of 10.3 05/11/2011    Carey Bullocks, OTR/L 04/12/2017, 10:26 AM  [facility] [address] [place], [place], [address] Phone: [phone]   Fax:  [phone]  Name: Derek Carr MRN: [id number] Date of Birth: [birth year]

## 2017-04-13 ENCOUNTER — Ambulatory Visit: Payer: Medicare HMO | Admitting: Physical Therapy

## 2017-04-13 ENCOUNTER — Encounter: Payer: Self-pay | Admitting: Physical Therapy

## 2017-04-13 ENCOUNTER — Ambulatory Visit: Payer: Medicare HMO

## 2017-04-13 ENCOUNTER — Ambulatory Visit: Payer: Medicare HMO | Admitting: Occupational Therapy

## 2017-04-13 ENCOUNTER — Encounter: Payer: Self-pay | Admitting: Occupational Therapy

## 2017-04-13 DIAGNOSIS — R471 Dysarthria and anarthria: Secondary | ICD-10-CM

## 2017-04-13 DIAGNOSIS — M79605 Pain in left leg: Secondary | ICD-10-CM

## 2017-04-13 DIAGNOSIS — R2681 Unsteadiness on feet: Secondary | ICD-10-CM

## 2017-04-13 DIAGNOSIS — M6281 Muscle weakness (generalized): Secondary | ICD-10-CM

## 2017-04-13 DIAGNOSIS — R278 Other lack of coordination: Secondary | ICD-10-CM

## 2017-04-13 DIAGNOSIS — R42 Dizziness and giddiness: Secondary | ICD-10-CM

## 2017-04-13 DIAGNOSIS — R2689 Other abnormalities of gait and mobility: Secondary | ICD-10-CM

## 2017-04-13 DIAGNOSIS — R41841 Cognitive communication deficit: Secondary | ICD-10-CM

## 2017-04-13 DIAGNOSIS — R41842 Visuospatial deficit: Secondary | ICD-10-CM

## 2017-04-13 DIAGNOSIS — R1312 Dysphagia, oropharyngeal phase: Secondary | ICD-10-CM

## 2017-04-13 DIAGNOSIS — I69815 Cognitive social or emotional deficit following other cerebrovascular disease: Secondary | ICD-10-CM

## 2017-04-13 NOTE — Therapy (Signed)
[facility] [address] [place], [place], [address] Phone: [phone]   Fax:  [phone]  Occupational Therapy Treatment  Patient Details  Name: Derek Carr MRN: [id number] Date of Birth: May 17, 1931 Referring Provider: Dr [name]   Encounter Date: 04/13/2017  OT End of Session - 04/13/17 1622    Visit Number  7    Number of Visits  17    Authorization Type  [employer] MCR    OT Start Time  1535    OT Stop Time  1615    OT Time Calculation (min)  40 min    Activity Tolerance  Patient tolerated treatment well    Behavior During Therapy  [facility] for tasks assessed/performed       Past Medical History:  Diagnosis Date  . Anemia   . Atherosclerosis   . Complication of anesthesia    CONFUSION - Pt's family very concerned about this  . DDD (degenerative disc disease)   . Degenerative arthritis   . Dermatitis, atopic ARMS AND LEGS  . Diverticulosis   . Erectile dysfunction   . Frequency of urination   . Glaucoma   . History of asbestos exposure   . History of radiation therapy 8/19-13-10/18/11   prostate, 84 GY  . History of shingles 2012-- BILATERAL EYES--  NO RESIDUAL  . Hyperlipidemia   . Hypertension   . Inguinal hernia   . Lumbar scoliosis   . Nocturia   . OA (osteoarthritis) of hip RIGHT  . Prostate cancer ([place]) 05/11/2011   bx=Adenocarcinoma,gleason3+4=7, 4+4=8,PSA=10.30volume=45.7cc  . Renal cyst    bilateral  . Smokers' cough ([place])   . Thyroid cyst     Past Surgical History:  Procedure Laterality Date  . ATTEMPTED LEFT VATS/ LEFT THORACOTOMY/ RESECTION OF THE ENORMOUS, PROBABLE BRONCHOGENIC CYST  01-04-2005  DR Arlyce Dice  . CARDIAC CATHETERIZATION  02-24-2009  DR NASHER   MINOR CORONARY ARTERY IRREGULARITIES/ NORMAL LVSF/ EF 65-70%  . CATARACT EXTRACTION    . COLONOSCOPY W/ POLYPECTOMY    . CYSTOSCOPY  11/15/2011   Procedure: CYSTOSCOPY;  Surgeon: Dutch Gray, MD;  Location: [facility];  Service: Urology;  Laterality: N/A;  no seeds seen in bladder  . esophageus cyst removal  YRS AGO  . [place]  . PROSTATE BIOPSY  05/11/11   Adenocarcinoma (MD OFFICE)  . RADIOACTIVE SEED IMPLANT  11/15/2011   Procedure: RADIOACTIVE SEED IMPLANT;  Surgeon: Dutch Gray, MD;  Location: [facility];  Service: Urology;  Laterality: N/A;  Total number of seeds - 52  . REPAIR RIGHT INGUINAL HERNIA W/ MESH  09-10-1999  . TOTAL HIP ARTHROPLASTY Right 03/20/2013   Procedure: RIGHT TOTAL HIP ARTHROPLASTY ANTERIOR APPROACH;  Surgeon: [name], MD;  [facility];  Service: Orthopedics;  Laterality: Right;  . TOTAL HIP REVISION Right 05/14/2013   Procedure: open reduction internal fixation REVISION RIGHT HIP ;  Surgeon: [name], MD;  Location: [facility];  Service: Orthopedics;  Laterality: Right;  . UMBILICAL HERNIA REPAIR  1998   epigastric    There were no vitals filed for this visit.  Subjective Assessment - 04/13/17 1543    Subjective   I was working on my balance (with PT)    Pertinent History  TIA'S, most recent 02/2017, 3 year history of dizziness, decreased baalnce, orthostatic hypotension, recent cataract extraction - 2 weeks ago    Currently in Pain?  No/denies  Pain Score  0-No pain                   OT Treatments/Exercises (OP) - 04/13/17 1610      ADLs   Cooking  Worked again on cooking task.  Patient more familiar with ADL kitchen as this was a second experience in our less familiar setting.  Patient without unsafe behavior, and without loss of balance, but needed occasional questioning cues to fry an egg on stovetop.  Patient able to walk short distances in kitchen without cane - used one hand to stabilize when walking along counter, and able to transfer food from counter to table ~ 4 feet without cane.  Patient does walk short distances at home without cane, but prefers cane for longer expanse due to balance problems.           Neurological Re-education Exercises   Other Exercises 1  UBE x 7 min to improve overall activity tolerance          Balance Exercises - 04/13/17 1455      Balance Exercises: Standing   Stepping Strategy  Anterior;Posterior;5 reps minA, Verbal cues and tactile cues for reaching and rot. Trk    Probation officer;Lateral;Head turns;EO;30 seconds;10 reps;Intermittent UE support reaching multilevels for weighted ball crossing midline           OT Short Term Goals - 04/12/17 1015      OT SHORT TERM GOAL #1   Title  Patient will don front opening shirt / jacket in standing independently (due 3/20)    Status  Achieved      OT SHORT TERM GOAL #2   Title  Patient will demonstrate reduced time on 9 hole peg test by 5 seconds to improve speed with daily dressing tasks.    Baseline  3/12- right 43.25, left 38.34     Status  Achieved      OT SHORT TERM GOAL #3   Title  Patient will use stovetop to cook simple meal item- e.g. fry an egg with close supervision, verbal cueing.      Status  Partially Met Also required min assist for safety      OT SHORT TERM GOAL #4   Title  Patient will complete a home exercise program designed to improve coordiantion in his hands.      Status  Achieved      OT SHORT TERM GOAL #5   Title  Patient will utilize compensatory strategy to effectively utilize pill box for am and pm medications with supervision.      Status  On-going Patient reports improvement- would like to verify with family member        OT Long Term Goals - 04/12/17 1016      OT LONG TERM GOAL #1   Title  Patient will effectively use simple hand tools in standing (screw driver, wrench, hammer) with supervision in preparation for returning to shop (due 4/19)    Time  8    Period  Weeks    Status  On-going      OT LONG TERM GOAL #2   Title  Patient will cook a simple familiar hot  meal using stovetop and/or oven- at least two items without assistance    Time  8     Period  Weeks    Status  Not Met Will need mod cues/close sup for safety d/t cognitive deficits and balance      OT LONG TERM GOAL #  3   Title  Patient will reduce speed on 9 hole peg test by 10 seconds to improve functional coordination during ADL/IADL- tool use.      Baseline  Initial score on 2/18- right 50 seconds, left 47 seconds    Time  8    Period  Weeks    Status  New      OT LONG TERM GOAL #4   Title  Patient will demonstrate adequate environmental visual scanning and functional mobility to safely navigate through crowded hallway, or busy retail store with supervision.      Time  8    Period  Weeks    Status  New      OT LONG TERM GOAL #5   Title  Patient will return to community exercise program with assistance from family member    Time  8    Period  Weeks    Status  New            Plan - 04/13/17 1622    Clinical Impression Statement  Pt with no LOB with static standing tasks. Pt does well with one hand support for dynamic standing tasks. Patient with cognitive deficits which limit his ability to translate novel tasks into functional changes in his own environment.      Occupational Profile and client history currently impacting functional performance  Patient is married - wife has dementia, father - 4 children live nearby, retired [employer], used to exercise at least 3 days/week - walking class, and water exercises, patient enjoys outdoor work, and working on cars in his shop.      Occupational performance deficits (Please refer to evaluation for details):  ADL's;IADL's;Leisure;Social Participation    Rehab Potential  Good    OT Frequency  2x / week    OT Duration  8 weeks    OT Treatment/Interventions  Self-care/ADL training;Energy conservation;Visual/perceptual remediation/compensation;Aquatic Therapy;DME and/or AE instruction;Patient/family education;Balance training;Functional Furniture conservator/restorer;Therapeutic exercise;Therapeutic activities;Neuromuscular  education    Plan  continue safety with ADLS, standing tasks, simple visual scanning with functional mobility    Clinical Decision Making  Limited treatment options, no task modification necessary    Consulted and Agree with Plan of Care  Patient       Patient will benefit from skilled therapeutic intervention in order to improve the following deficits and impairments:  Impaired vision/preception, Improper body mechanics, Impaired perceived functional ability, Decreased activity tolerance, Decreased knowledge of use of DME, Decreased strength, Impaired sensation, Decreased mobility, Decreased balance, Decreased cognition, Decreased safety awareness  Visit Diagnosis: Muscle weakness (generalized)  Other lack of coordination  Unsteadiness on feet  Cognitive social or emotional deficit following other cerebrovascular disease  Visuospatial deficit    Problem List Patient Active Problem List   Diagnosis Date Noted  . Axonal neuropathy 03/21/2017  . Right sided weakness 03/01/2017  . Cigarette smoker 06/16/2016  . Dyspnea on exertion 06/15/2016  . Alzheimer disease 01/07/2014  . CSA (central sleep apnea) 10/05/2013  . Syncope 06/26/2013  . COPD GOLD II 06/25/2013  . BPH (benign prostatic hyperplasia) 05/18/2013  . Glaucoma 05/18/2013  . S/P right TH revision 05/14/2013  . Constipation 03/26/2013  . Osteoporosis 03/26/2013  . Expected blood loss anemia 03/23/2013  . S/P right THA, AA 03/20/2013  . Hyperlipidemia   . Erectile dysfunction   . History of asbestos exposure   . Nocturia   . History of shingles   . Dermatitis, atopic   . OA (osteoarthritis) of hip   .  Arthritis   . Frequency of urination   . Lumbar scoliosis   . Malignant neoplasm of prostate ([place]) 06/07/2011  . 84 year old gentleman with stage T3 adenocarcinoma prostate with Gleason score 4+4 and PSA of 10.3 05/11/2011    Mariah Milling, OTR/L 04/13/2017, 4:26 PM  [facility] [address] [place], [place], [address] Phone: [phone]   Fax:  [phone]  Name: JACY HOWAT MRN: [id number] Date of Birth: 1932-01-06

## 2017-04-13 NOTE — Therapy (Signed)
[facility] [address] [place], [place], [address] Phone: [phone]   Fax:  [phone]  Speech Language Pathology Treatment  Patient Details  Name: SIRIUS WOODFORD MRN: [id number] Date of Birth: 10/04/1931 Referring Provider: [name], MD   Encounter Date: 04/13/2017  End of Session - 04/13/17 1411    Visit Number  4    Number of Visits  17    Date for SLP Re-Evaluation  05/20/17    SLP Start Time  15    SLP Stop Time   1401    SLP Time Calculation (min)  43 min    Activity Tolerance  Patient tolerated treatment well       Past Medical History:  Diagnosis Date  . Anemia   . Atherosclerosis   . Complication of anesthesia    CONFUSION - Pt's family very concerned about this  . DDD (degenerative disc disease)   . Degenerative arthritis   . Dermatitis, atopic ARMS AND LEGS  . Diverticulosis   . Erectile dysfunction   . Frequency of urination   . Glaucoma   . History of asbestos exposure   . History of radiation therapy 8/19-13-10/18/11   prostate, 53 GY  . History of shingles 2012-- BILATERAL EYES--  NO RESIDUAL  . Hyperlipidemia   . Hypertension   . Inguinal hernia   . Lumbar scoliosis   . Nocturia   . OA (osteoarthritis) of hip RIGHT  . Prostate cancer ([place]) 05/11/2011   bx=Adenocarcinoma,gleason3+4=7, 4+4=8,PSA=10.30volume=45.7cc  . Renal cyst    bilateral  . Smokers' cough ([place])   . Thyroid cyst     Past Surgical History:  Procedure Laterality Date  . ATTEMPTED LEFT VATS/ LEFT THORACOTOMY/ RESECTION OF THE ENORMOUS, PROBABLE BRONCHOGENIC CYST  01-04-2005  DR Arlyce Dice  . CARDIAC CATHETERIZATION  02-24-2009  DR NASHER   MINOR CORONARY ARTERY IRREGULARITIES/ NORMAL LVSF/ EF 65-70%  . CATARACT EXTRACTION    . COLONOSCOPY W/ POLYPECTOMY    . CYSTOSCOPY  11/15/2011   Procedure: CYSTOSCOPY;  Surgeon: Dutch Gray, MD;  Location: [facility];  Service: Urology;  Laterality: N/A;   no seeds seen in bladder  . esophageus cyst removal  YRS AGO  . [place]  . PROSTATE BIOPSY  05/11/11   Adenocarcinoma (MD OFFICE)  . RADIOACTIVE SEED IMPLANT  11/15/2011   Procedure: RADIOACTIVE SEED IMPLANT;  Surgeon: Dutch Gray, MD;  Location: [facility];  Service: Urology;  Laterality: N/A;  Total number of seeds - 52  . REPAIR RIGHT INGUINAL HERNIA W/ MESH  09-10-1999  . TOTAL HIP ARTHROPLASTY Right 03/20/2013   Procedure: RIGHT TOTAL HIP ARTHROPLASTY ANTERIOR APPROACH;  Surgeon: [name], MD;  Location: [facility];  Service: Orthopedics;  Laterality: Right;  . TOTAL HIP REVISION Right 05/14/2013   Procedure: open reduction internal fixation REVISION RIGHT HIP ;  Surgeon: [name], MD;  Location: [facility];  Service: Orthopedics;  Laterality: Right;  . UMBILICAL HERNIA REPAIR  1998   epigastric    There were no vitals filed for this visit.  Subjective Assessment - 04/13/17 1322    Subjective  "My daughter is going to be here in a bit but here's what she gave me." (pt hands HEP to SLP)            ADULT SLP TREATMENT - 04/13/17 1325      General Information   Behavior/Cognition  Alert;Cooperative;Requires cueing;Pleasant mood  Treatment Provided   Treatment provided  Dysphagia      Dysphagia Treatment   Temperature Spikes Noted  No    Respiratory Status  Room air    Treatment Methods  Skilled observation;Therapeutic exercise;Compensation strategy training;Patient/caregiver education    Patient observed directly with PO's  No    Type of PO's observed  Thin liquids    Type of cueing  Verbal;Visual    Amount of cueing  Maximal    Other treatment/comments  Pt recalled correct ST room today. Wet voice noted, pt req'd cues to clear and reswallow. Daughter notes less coughing when pt eats. Daughter states, "I didn't expect such a chnage yet." Daughter states she is not providing assistance with exercises, so SLP did not initially provide  any assistance to simulate what pt would do at home with HEP. Pt did not perform any of the exercises correctly and req'd max A to perform correctly. SLP told daughter she will need to perform HEP with pt for at least a week to have pt familiarize himself with HEP to begin to complete correctly on his own. Pt req'd cues throughout session to clear throat and reswallow when hypdrophonic voice detected. Pt generated loud /a/ with consistent mod cues for full breath and loudness with average low-mid 80s dB.       Pain Assessment   Pain Assessment  No/denies pain      Assessment / Recommendations / Plan   Plan  Continue with current plan of care      Progression Toward Goals   Progression toward goals  Progressing toward goals       SLP Education - 04/13/17 1652    Education provided  Yes    Education Details  HEP (dysphagia), daughter needs to complete HEP with pt for accuracy of exercises    Person(s) Educated  Patient;Child(ren)    Methods  Explanation;Demonstration;Verbal cues    Comprehension  Verbalized understanding;Need further instruction;Returned demonstration;Verbal cues required       SLP Short Term Goals - 04/13/17 1705      SLP SHORT TERM GOAL #1   Title  pt will complete HEP for dysarthria and dysphagia with occasional mod A over three sessions    Time  4    Period  Weeks    Status  Revised      SLP SHORT TERM GOAL #2   Title  pt will follow swallow precautions with POs during session with occasional min A over two sessions    Time  4    Period  Weeks    Status  Revised      SLP SHORT TERM GOAL #3   Title  pt/family will acknowledge knowledge of 3 overt s/s aspiration PNA    Time  4    Period  Weeks    Status  On-going      SLP SHORT TERM GOAL #4   Title  pt will generate loud /a/ at average low-mid 80s over 3 sessions    Baseline  04-13-17    Time  4    Period  Weeks    Status  On-going       SLP Long Term Goals - 04/13/17 1707      SLP LONG TERM GOAL  #1   Title  pt will demo swallow and oral-motor HEP with rare min A over two sessions    Time  8    Period  Weeks    Status  On-going  SLP LONG TERM GOAL #2   Title  pt will complete loud /a/ with average low to mid 80s dB over 5 sessions    Baseline  04-13-17    Time  8    Period  Weeks    Status  On-going      SLP LONG TERM GOAL #3   Title  pt will use average speech loudness of low 70s dB in 5 minutes simple conversation over two sessions    Time  8    Period  Weeks    Status  On-going       Plan - 04/13/17 1653    Clinical Impression Statement  Arrived alone to ST today, however daughter joined session the last 20 minutes. Pt continues with swallowing deficits, cognitive communication deficits. Pt appeared to speak with WNL loudness, daughter states pt's loudness at home is better, and that pt is coughing less. Daughter states she's surprised pt is having success this quickly. SLP questions pt's success at this time due to pt not independently completing any of the HEP correctly. Cognitive linguistic deficits are negatively affecting pt's ability to complete HEP for swallowing and his ability to recall compensations. SLP told daughter today she must complete HEP with pt. He would benefit from skilled ST to address swallowing and dysarthria for safety with POs as well as ease of communication.     Speech Therapy Frequency  2x / week    Treatment/Interventions  Aspiration precaution training;Pharyngeal strengthening exercises;Diet toleration management by SLP;Internal/external aids;Patient/family education;Compensatory strategies;SLP instruction and feedback;Oral motor exercises;Cueing hierarchy;Environmental controls;Cognitive reorganization    Potential to Achieve Goals  Fair    Potential Considerations  Previous level of function;Ability to learn/carryover information    Consulted and Agree with Plan of Care  Patient       Patient will benefit from skilled therapeutic  intervention in order to improve the following deficits and impairments:   Dysphagia, oropharyngeal  Dysarthria and anarthria  Cognitive communication deficit    Problem List Patient Active Problem List   Diagnosis Date Noted  . Axonal neuropathy 03/21/2017  . Right sided weakness 03/01/2017  . Cigarette smoker 06/16/2016  . Dyspnea on exertion 06/15/2016  . Alzheimer disease 01/07/2014  . CSA (central sleep apnea) 10/05/2013  . Syncope 06/26/2013  . COPD GOLD II 06/25/2013  . BPH (benign prostatic hyperplasia) 05/18/2013  . Glaucoma 05/18/2013  . S/P right TH revision 05/14/2013  . Constipation 03/26/2013  . Osteoporosis 03/26/2013  . Expected blood loss anemia 03/23/2013  . S/P right THA, AA 03/20/2013  . Hyperlipidemia   . Erectile dysfunction   . History of asbestos exposure   . Nocturia   . History of shingles   . Dermatitis, atopic   . OA (osteoarthritis) of hip   . Arthritis   . Frequency of urination   . Lumbar scoliosis   . Malignant neoplasm of prostate ([place]) 06/07/2011  . 84 year old gentleman with stage T3 adenocarcinoma prostate with Gleason score 4+4 and PSA of 10.3 05/11/2011    [facility] ,MS, CCC-SLP  04/13/2017, 5:08 PM  [facility] [address] [place], [place], [address] Phone: [phone]   Fax:  [phone]   Name: JAMARQUES PINEDO MRN: [id number] Date of Birth: [birth year]

## 2017-04-13 NOTE — Therapy (Signed)
[facility] [address] [place], [place], [address] Phone: [phone]   Fax:  [phone]  Physical Therapy Treatment  Patient Details  Name: Derek Carr MRN: [id number] Date of Birth: 1932-01-07 Referring Provider: Dr [name]   Encounter Date: 04/13/2017  PT End of Session - 04/13/17 1543    Visit Number  5    Number of Visits  17    Date for PT Re-Evaluation  05/13/17    Authorization Type  [employer] & Generic commercial 2nd    PT Start Time  1450    PT Stop Time  1535    PT Time Calculation (min)  45 min    Equipment Utilized During Treatment  Gait belt    Activity Tolerance  Patient tolerated treatment well    Behavior During Therapy  [facility] for tasks assessed/performed       Past Medical History:  Diagnosis Date  . Anemia   . Atherosclerosis   . Complication of anesthesia    CONFUSION - Pt's family very concerned about this  . DDD (degenerative disc disease)   . Degenerative arthritis   . Dermatitis, atopic ARMS AND LEGS  . Diverticulosis   . Erectile dysfunction   . Frequency of urination   . Glaucoma   . History of asbestos exposure   . History of radiation therapy 8/19-13-10/18/11   prostate, 37 GY  . History of shingles 2012-- BILATERAL EYES--  NO RESIDUAL  . Hyperlipidemia   . Hypertension   . Inguinal hernia   . Lumbar scoliosis   . Nocturia   . OA (osteoarthritis) of hip RIGHT  . Prostate cancer ([place]) 05/11/2011   bx=Adenocarcinoma,gleason3+4=7, 4+4=8,PSA=10.30volume=45.7cc  . Renal cyst    bilateral  . Smokers' cough ([place])   . Thyroid cyst     Past Surgical History:  Procedure Laterality Date  . ATTEMPTED LEFT VATS/ LEFT THORACOTOMY/ RESECTION OF THE ENORMOUS, PROBABLE BRONCHOGENIC CYST  01-04-2005  DR Arlyce Dice  . CARDIAC CATHETERIZATION  02-24-2009  DR NASHER   MINOR CORONARY ARTERY IRREGULARITIES/ NORMAL LVSF/ EF 65-70%  . CATARACT EXTRACTION    . COLONOSCOPY W/  POLYPECTOMY    . CYSTOSCOPY  11/15/2011   Procedure: CYSTOSCOPY;  Surgeon: Dutch Gray, MD;  Location: [facility];  Service: Urology;  Laterality: N/A;  no seeds seen in bladder  . esophageus cyst removal  YRS AGO  . [place]  . PROSTATE BIOPSY  05/11/11   Adenocarcinoma (MD OFFICE)  . RADIOACTIVE SEED IMPLANT  11/15/2011   Procedure: RADIOACTIVE SEED IMPLANT;  Surgeon: Dutch Gray, MD;  Location: [facility];  Service: Urology;  Laterality: N/A;  Total number of seeds - 52  . REPAIR RIGHT INGUINAL HERNIA W/ MESH  09-10-1999  . TOTAL HIP ARTHROPLASTY Right 03/20/2013   Procedure: RIGHT TOTAL HIP ARTHROPLASTY ANTERIOR APPROACH;  Surgeon: [name], MD;  Location[facility];  Service: Orthopedics;  Laterality: Right;  . TOTAL HIP REVISION Right 05/14/2013   Procedure: open reduction internal fixation REVISION RIGHT HIP ;  Surgeon: [name], MD;  Location: [facility];  Service: Orthopedics;  Laterality: Right;  . UMBILICAL HERNIA REPAIR  1998   epigastric    There were no vitals filed for this visit.  Subjective Assessment - 04/13/17 1539    Subjective  pt reports no falls since last PT session. Pt reports he has continued to do his HEP but relies on his paper to  complete exercises.     Pertinent History  TIA, DDD, Lumbar scoliosis, Glaucoma, cataract surgery, HTN, right THR 03/20/13 with revision 05/14/13, right femur fracture    Limitations  Standing;Walking;House hold activities    Patient Stated Goals  Patient wants to improve his balance & walking    Currently in Pain?  No/denies                      [facility] Adult PT Treatment/Exercise - 04/13/17 1455      Transfers   Transfers  Stand to Sit;Sit to Stand    Sit to Stand  5: Supervision;From chair/3-in-1;With armrests;Without upper extremity assist;With upper extremity assist    Stand to Sit  5: Supervision;With upper extremity assist;With armrests;To chair/3-in-1       Ambulation/Gait   Ambulation/Gait  Yes    Ambulation/Gait Assistance  5: Supervision;4: Min guard    Ambulation/Gait Assistance Details  Neuro re-ed: scanning and cognitive task while walking outdoors, minG    Ambulation Distance (Feet)  500 Feet minG over grass and curbs     Assistive device  Straight cane    Gait Pattern  Step-through pattern;Decreased arm swing - right;Decreased step length - right;Decreased step length - left;Decreased stride length;Decreased dorsiflexion - right;Trunk flexed;Poor foot clearance - right;Poor foot clearance - left;Decreased stance time - right;Decreased hip/knee flexion - right    Ambulation Surface  Indoor;Unlevel;Level;Outdoor;Paved;Gravel;Grass    Ramp  5: Supervision    Ramp Details (indicate cue type and reason)  minimal cues for safety    Curb  5: Supervision    Curb Details (indicate cue type and reason)  Verbal cues for awareness of curb    Gait Comments  Verbal cues to maintain gait speed during scanning task       Exercises   Exercises  Other Exercises    Other Exercises   Nu-step (set at 15 leg and arm. at level 3 for 5.5 min  Verbal cues for set up           Balance Exercises - 04/13/17 1455      Balance Exercises: Standing   Stepping Strategy  Anterior;Posterior;5 reps minA, Verbal cues and tactile cues for reaching and rot. Trk    Probation officer;Lateral;Head turns;EO;30 seconds;10 reps;Intermittent UE support reaching multilevels for weighted ball crossing midline         PT Education - 04/13/17 1542    Education provided  Yes    Education Details  Falls reduction and HEP compliance     Person(s) Educated  Patient    Methods  Explanation    Comprehension  Verbalized understanding       PT Short Term Goals - 04/11/17 1547      PT SHORT TERM GOAL #1   Title  Pt will be independent with initial HEP exercises (All STGs Target Date 04/13/2017)    Baseline  MET(3/18) with the use of his print out    Time  1     Period  Months    Status  Achieved      PT SHORT TERM GOAL #2   Title  Pt will demonstrate ramp & Curb navigation Minimal Guard to improve confidence and safety ambulating in home and community environment     Baseline  MET(3/18) ramps and curbs Supervision     Time  1    Period  Months    Status  Achieved      PT SHORT TERM GOAL #3  Title  Patient ambuates with LRAD scanning environment with no balance loss.     Baseline  MET(3/18) 200+ ambulation with scanning task with no LOB  and mild reduction in gait speed    Time  1    Period  Months    Status  Achieved      PT SHORT TERM GOAL #4   Title  Berg Balance >/= 30/56    Baseline  MET(3/18) Berg Balance 41/56, inital 24/56    Time  1    Period  Months    Status  Achieved      PT SHORT TERM GOAL #5   Title  Patient ambulates 200' with LRAD with supervision.     Baseline  MET(3/18) pt walked 200+ feet without rest breaks and with the [facility] with quad tip    Time  1    Period  Months    Status  Achieved        PT Long Term Goals - 04/13/17 1544      PT LONG TERM GOAL #1   Title  Pt will demonstrate independence with ongoing HEP program and fitness plan (All LTGs Target Date 05/13/2017)    Time  2    Period  Months    Status  On-going      PT LONG TERM GOAL #2   Title  Pt will demonstrate Mod I 500' ambulation outdoors including grass, pavement, curbs, ramps with LRAD to improve community ambulatory skills    Time  2    Period  Months    Status  On-going      PT LONG TERM GOAL #3   Title  Pt will demonstrate Berg > 45/56 in order to reduce falls risk & indicate less dependency in ADLs.    Baseline  Berg 24/56 (03/14/17),  41/56 on 04/11/2017    Time  2    Period  Months    Status  Revised      PT LONG TERM GOAL #4   Title  Patient performs Timed Up-Go time with cane <17 seconds to indicate lower fall risk.     Baseline  With cane 21.72 without cane 19.75(2/18)    Time  2    Period  Months    Status  On-going       PT LONG TERM GOAL #5   Title  Pt will negotiate 2 stairs without Rail assist with LRAD with family supervision to improve safety when entering/ exiting home.     Time  2    Period  Months    Status  On-going            Plan - 04/13/17 1556    Clinical Impression Statement  During todays PT session patient continues to verbalize HEP compliance and demonstrated continued improvement in high level balance working on stepping strategy.  Pt demonstrates significant hesitation to step anteriorly with his left leg during stepping strategy. Pt demonstrated a single loss of balance that was self-corrected when turning to look over shoulder.pt also demonstrated significant increase in ambulatory endurance walking 558f without a rest break.     Rehab Potential  Good    Clinical Impairments Affecting Rehab Potential  progressive decline over 2 years, good attitude and family support with 4 children in area.     PT Frequency  2x / week    PT Duration  Other (comment)    PT Treatment/Interventions  Gait training;Functional mobility training;Neuromuscular re-education;Balance training;Therapeutic exercise;Therapeutic activities;Patient/family education;ADLs/Self  Care Home Management;Stair training;Orthotic Fit/Training;Manual techniques;Vestibular    PT Next Visit Plan  Continue to progress patients dual task gait skills as well as other high level balance activates in order to continue to progress towards LTGs.    Consulted and Agree with Plan of Care  Patient    Family Member Consulted  daughter       Patient will benefit from skilled therapeutic intervention in order to improve the following deficits and impairments:  Abnormal gait, Decreased balance, Decreased endurance, Decreased mobility, Difficulty walking, Decreased activity tolerance, Decreased strength, Decreased safety awareness  Visit Diagnosis: Unsteadiness on feet  Muscle weakness (generalized)  Dizziness and giddiness  Other  abnormalities of gait and mobility  Pain in left leg  Other lack of coordination     Problem List Patient Active Problem List   Diagnosis Date Noted  . Axonal neuropathy 03/21/2017  . Right sided weakness 03/01/2017  . Cigarette smoker 06/16/2016  . Dyspnea on exertion 06/15/2016  . Alzheimer disease 01/07/2014  . CSA (central sleep apnea) 10/05/2013  . Syncope 06/26/2013  . COPD GOLD II 06/25/2013  . BPH (benign prostatic hyperplasia) 05/18/2013  . Glaucoma 05/18/2013  . S/P right TH revision 05/14/2013  . Constipation 03/26/2013  . Osteoporosis 03/26/2013  . Expected blood loss anemia 03/23/2013  . S/P right THA, AA 03/20/2013  . Hyperlipidemia   . Erectile dysfunction   . History of asbestos exposure   . Nocturia   . History of shingles   . Dermatitis, atopic   . OA (osteoarthritis) of hip   . Arthritis   . Frequency of urination   . Lumbar scoliosis   . Malignant neoplasm of prostate ([place]) 06/07/2011  . 84 year old gentleman with stage T3 adenocarcinoma prostate with Gleason score 4+4 and PSA of 10.3 05/11/2011   Waunita Schooner SPT 04/13/2017, 3:59 PM  WALDRON,ROBIN PT, DPT 04/13/2017, 4:49 PM  [facility] [address] [place], [place], [address] Phone: [phone]   Fax:  [phone]  Name: CERRONE DEBOLD MRN: [id number] Date of Birth: 1931/04/22

## 2017-04-14 ENCOUNTER — Ambulatory Visit: Payer: Medicare HMO | Admitting: Speech Pathology

## 2017-04-18 ENCOUNTER — Ambulatory Visit: Payer: Medicare HMO | Admitting: Physical Therapy

## 2017-04-18 ENCOUNTER — Ambulatory Visit: Payer: Medicare HMO | Admitting: Occupational Therapy

## 2017-04-18 ENCOUNTER — Telehealth: Payer: Self-pay | Admitting: Physical Therapy

## 2017-04-18 ENCOUNTER — Ambulatory Visit: Payer: Medicare HMO | Admitting: Speech Pathology

## 2017-04-18 DIAGNOSIS — R41841 Cognitive communication deficit: Secondary | ICD-10-CM

## 2017-04-18 DIAGNOSIS — R1312 Dysphagia, oropharyngeal phase: Secondary | ICD-10-CM

## 2017-04-18 DIAGNOSIS — R2681 Unsteadiness on feet: Secondary | ICD-10-CM | POA: Diagnosis not present

## 2017-04-18 NOTE — Patient Instructions (Addendum)
SWALLOWING EXERCISES Do these as indicated for 8 weeks, then 2 times per week afterwards     1. Effortful Swallows : (Hard swallow. Don't use your hands) - Press your tongue against the roof of your mouth for 3 seconds, then squeeze the muscles in your neck while you swallow your saliva or a sip of water - Repeat 10-15 times, 2-3 times a day, and use whenever you eat or drink   2. Masako Swallow - swallow with your tongue sticking out - Stick tongue out past your teeth and gently bite tongue with your teeth - Swallow, while holding your tongue with your teeth - Repeat 10-15 times, 2-3 times a day *use a wet spoon if your mouth gets dry*   3. Mendelsohn Maneuver - "half swallow" exercise  (Throat muscles do the work!  You can use your fingers to feel your Adam's apple move, but don't squeeze with your fingers) - Start to swallow, and keep your Adam's apple up by squeezing hard with the muscles of the throat - Hold the squeeze for 5-7 seconds and then relax - Repeat 10-15 times, 2-3 times a day *use a wet spoon if your mouth gets dry*   4. Chin tuck against resistance : (Don't let your hand push your mouth closed) - Open your mouth  - Place your fist UNDER your chin near your neck, and push back with your fist for 5 seconds - Repeat 10 times, 2-3 times a day         5.  Open mouth swallow            - Open your mouth and swallow your saliva            - Repeat 10-15 times, 2-3 times a day         6.   Lip strength - Blow through a straw for 1 second then plug straw for 3 seconds and KEEP THE AIR IN YOUR MOUTH!             - Repeat 10 times, 2-3 times a day   Keep having Robin help you with exercises and remind you about your swallow strategies when you eat. Bring her to therapy with you if you can.

## 2017-04-18 NOTE — Therapy (Signed)
[facility] [address] [place], [place], [address] Phone: [phone]   Fax:  [phone]  Speech Language Pathology Treatment  Patient Details  Name: Derek Carr MRN: [id number] Date of Birth: 1931-07-01 Referring Provider: [name], MD   Encounter Date: 04/18/2017  End of Session - 04/18/17 1808    Visit Number  5    Number of Visits  17    Date for SLP Re-Evaluation  05/20/17    SLP Start Time  1540    SLP Stop Time   5400    SLP Time Calculation (min)  37 min    Activity Tolerance  Patient tolerated treatment well       Past Medical History:  Diagnosis Date  . Anemia   . Atherosclerosis   . Complication of anesthesia    CONFUSION - Pt's family very concerned about this  . DDD (degenerative disc disease)   . Degenerative arthritis   . Dermatitis, atopic ARMS AND LEGS  . Diverticulosis   . Erectile dysfunction   . Frequency of urination   . Glaucoma   . History of asbestos exposure   . History of radiation therapy 8/19-13-10/18/11   prostate, 45 GY  . History of shingles 2012-- BILATERAL EYES--  NO RESIDUAL  . Hyperlipidemia   . Hypertension   . Inguinal hernia   . Lumbar scoliosis   . Nocturia   . OA (osteoarthritis) of hip RIGHT  . Prostate cancer ([place]) 05/11/2011   bx=Adenocarcinoma,gleason3+4=7, 4+4=8,PSA=10.30volume=45.7cc  . Renal cyst    bilateral  . Smokers' cough ([place])   . Thyroid cyst     Past Surgical History:  Procedure Laterality Date  . ATTEMPTED LEFT VATS/ LEFT THORACOTOMY/ RESECTION OF THE ENORMOUS, PROBABLE BRONCHOGENIC CYST  01-04-2005  DR Arlyce Dice  . CARDIAC CATHETERIZATION  02-24-2009  DR NASHER   MINOR CORONARY ARTERY IRREGULARITIES/ NORMAL LVSF/ EF 65-70%  . CATARACT EXTRACTION    . COLONOSCOPY W/ POLYPECTOMY    . CYSTOSCOPY  11/15/2011   Procedure: CYSTOSCOPY;  Surgeon: Dutch Gray, MD;  Location: [facility];  Service: Urology;  Laterality: N/A;   no seeds seen in bladder  . esophageus cyst removal  YRS AGO  . [place]  . PROSTATE BIOPSY  05/11/11   Adenocarcinoma (MD OFFICE)  . RADIOACTIVE SEED IMPLANT  11/15/2011   Procedure: RADIOACTIVE SEED IMPLANT;  Surgeon: Dutch Gray, MD;  Location: [facility];  Service: Urology;  Laterality: N/A;  Total number of seeds - 52  . REPAIR RIGHT INGUINAL HERNIA W/ MESH  09-10-1999  . TOTAL HIP ARTHROPLASTY Right 03/20/2013   Procedure: RIGHT TOTAL HIP ARTHROPLASTY ANTERIOR APPROACH;  Surgeon: [name], MD;  Location: [facility];  Service: Orthopedics;  Laterality: Right;  . TOTAL HIP REVISION Right 05/14/2013   Procedure: open reduction internal fixation REVISION RIGHT HIP ;  Surgeon: [name], MD;  Location: [facility];  Service: Orthopedics;  Laterality: Right;  . UMBILICAL HERNIA REPAIR  1998   epigastric    There were no vitals filed for this visit.  Subjective Assessment - 04/18/17 1540    Subjective  "I'm by myself today."    Currently in Pain?  No/denies            ADULT SLP TREATMENT - 04/18/17 1540      General Information   Behavior/Cognition  Alert;Cooperative;Requires cueing;Pleasant mood    Patient Positioning  Upright in chair  Treatment Provided   Treatment provided  Dysphagia      Dysphagia Treatment   Temperature Spikes Noted  No    Respiratory Status  Room air    Treatment Methods  Skilled observation;Therapeutic exercise;Compensation strategy training;Patient/caregiver education    Patient observed directly with PO's  No    Type of cueing  Verbal;Visual    Amount of cueing  Maximal    Other treatment/comments  Pt arrived alone to ST. He reports his daughter has been helping him with swallowing exercises. Pt did not bring his HEP, so SLP provided an additional copy. Initially, max A required for pt to correctly sequence and perform exercises, however once pt had completed 2-3/10 repetitions correctly, only occasional min A  required in remaining repetitions. Pt able to complete all exercises with the exception of open mouth swallow. SLP worked with pt on [employer], first with SLP providing tactile and verbal cues for holding at the height of the swallow, then transitioning to pt palpating on his own, occasional min A required to refrain from using his fingers to "hold" his voice box. After 5 successful repetitions, pt notably fatigued and with subjectively weaker swallows, requiring extended time.      Pain Assessment   Pain Assessment  No/denies pain      Assessment / Recommendations / Plan   Plan  Continue with current plan of care      Progression Toward Goals   Progression toward goals  Progressing toward goals       SLP Education - 04/18/17 1807    Education provided  Yes    Education Details  daughter to assist with HEP, come to therapy sessions if able    Person(s) Educated  Patient    Methods  Explanation;Handout    Comprehension  Verbalized understanding;Need further instruction;Returned demonstration;Verbal cues required       SLP Short Term Goals - 04/18/17 1809      SLP SHORT TERM GOAL #1   Title  pt will complete HEP for dysarthria and dysphagia with occasional mod A over three sessions    Time  3    Period  Weeks    Status  On-going      SLP SHORT TERM GOAL #2   Title  pt will follow swallow precautions with POs during session with occasional min A over two sessions    Time  3    Period  Weeks    Status  On-going      SLP SHORT TERM GOAL #3   Title  pt/family will acknowledge knowledge of 3 overt s/s aspiration PNA    Time  3    Period  Weeks    Status  On-going      SLP SHORT TERM GOAL #4   Title  pt will generate loud /a/ at average low-mid 80s over 3 sessions    Time  3    Period  Weeks    Status  On-going       SLP Long Term Goals - 04/18/17 1810      SLP LONG TERM GOAL #1   Title  pt will demo swallow and oral-motor HEP with rare min A over two sessions     Time  7    Period  Weeks    Status  On-going      SLP LONG TERM GOAL #2   Title  pt will complete loud /a/ with average low to mid 80s dB over 5 sessions  Time  7    Period  Weeks    Status  On-going      SLP LONG TERM GOAL #3   Title  pt will use average speech loudness of low 70s dB in 5 minutes simple conversation over two sessions    Time  7    Period  Weeks    Status  On-going       Plan - 04/18/17 1808    Clinical Impression Statement  Arrived alone to ST today. Pt continues with swallowing deficits, cognitive communication deficits. Cognitive linguistic deficits are negatively affecting pt's ability to complete HEP for swallowing and his ability to recall compensations, however he required less intensive cuing this session. He would benefit from skilled ST to address swallowing and dysarthria for safety with POs as well as ease of communication.     Speech Therapy Frequency  2x / week    Treatment/Interventions  Aspiration precaution training;Pharyngeal strengthening exercises;Diet toleration management by SLP;Internal/external aids;Patient/family education;Compensatory strategies;SLP instruction and feedback;Oral motor exercises;Cueing hierarchy;Environmental controls;Cognitive reorganization    Potential to Achieve Goals  Fair    Potential Considerations  Previous level of function;Ability to learn/carryover information    Consulted and Agree with Plan of Care  Patient       Patient will benefit from skilled therapeutic intervention in order to improve the following deficits and impairments:   Dysphagia, oropharyngeal  Cognitive communication deficit    Problem List Patient Active Problem List   Diagnosis Date Noted  . Axonal neuropathy 03/21/2017  . Right sided weakness 03/01/2017  . Cigarette smoker 06/16/2016  . Dyspnea on exertion 06/15/2016  . Alzheimer disease 01/07/2014  . CSA (central sleep apnea) 10/05/2013  . Syncope 06/26/2013  . COPD GOLD II  06/25/2013  . BPH (benign prostatic hyperplasia) 05/18/2013  . Glaucoma 05/18/2013  . S/P right TH revision 05/14/2013  . Constipation 03/26/2013  . Osteoporosis 03/26/2013  . Expected blood loss anemia 03/23/2013  . S/P right THA, AA 03/20/2013  . Hyperlipidemia   . Erectile dysfunction   . History of asbestos exposure   . Nocturia   . History of shingles   . Dermatitis, atopic   . OA (osteoarthritis) of hip   . Arthritis   . Frequency of urination   . Lumbar scoliosis   . Malignant neoplasm of prostate ([place]) 06/07/2011  . 84 year old gentleman with stage T3 adenocarcinoma prostate with Gleason score 4+4 and PSA of 10.3 05/11/2011   Deneise Lever, MS, CCC-SLP Speech-Language Pathologist  Aliene Altes 04/18/2017, 6:11 PM  [facility] [address] [place], [place], [address] Phone: [phone]   Fax:  [phone]   Name: Derek Carr MRN: [id number] Date of Birth: 08-Mar-1931

## 2017-04-19 ENCOUNTER — Ambulatory Visit: Payer: Medicare HMO | Admitting: Speech Pathology

## 2017-04-20 ENCOUNTER — Encounter: Payer: Self-pay | Admitting: Occupational Therapy

## 2017-04-20 ENCOUNTER — Ambulatory Visit: Payer: Medicare HMO | Admitting: Occupational Therapy

## 2017-04-20 ENCOUNTER — Ambulatory Visit: Payer: Medicare HMO

## 2017-04-20 ENCOUNTER — Ambulatory Visit: Payer: Medicare HMO | Admitting: Physical Therapy

## 2017-04-20 VITALS — BP 140/78 | HR 67

## 2017-04-20 DIAGNOSIS — R42 Dizziness and giddiness: Secondary | ICD-10-CM

## 2017-04-20 DIAGNOSIS — I69815 Cognitive social or emotional deficit following other cerebrovascular disease: Secondary | ICD-10-CM

## 2017-04-20 DIAGNOSIS — M6281 Muscle weakness (generalized): Secondary | ICD-10-CM

## 2017-04-20 DIAGNOSIS — R41842 Visuospatial deficit: Secondary | ICD-10-CM

## 2017-04-20 DIAGNOSIS — R2681 Unsteadiness on feet: Secondary | ICD-10-CM | POA: Diagnosis not present

## 2017-04-20 DIAGNOSIS — M79605 Pain in left leg: Secondary | ICD-10-CM

## 2017-04-20 DIAGNOSIS — R262 Difficulty in walking, not elsewhere classified: Secondary | ICD-10-CM

## 2017-04-20 DIAGNOSIS — R471 Dysarthria and anarthria: Secondary | ICD-10-CM

## 2017-04-20 DIAGNOSIS — R2689 Other abnormalities of gait and mobility: Secondary | ICD-10-CM

## 2017-04-20 DIAGNOSIS — R41841 Cognitive communication deficit: Secondary | ICD-10-CM

## 2017-04-20 DIAGNOSIS — R278 Other lack of coordination: Secondary | ICD-10-CM

## 2017-04-20 DIAGNOSIS — R1312 Dysphagia, oropharyngeal phase: Secondary | ICD-10-CM

## 2017-04-20 NOTE — Patient Instructions (Signed)
Orthostatic Hypotension Orthostatic hypotension is a sudden drop in blood pressure that happens when you quickly change positions, such as when you get up from a seated or lying position. Blood pressure is a measurement of how strongly, or weakly, your blood is pressing against the walls of your arteries. Arteries are blood vessels that carry blood from your heart throughout your body. When blood pressure is too low, you may not get enough blood to your brain or to the rest of your organs. This can cause weakness, light-headedness, rapid heartbeat, and fainting. This can last for just a few seconds or for up to a few minutes. Orthostatic hypotension is usually not a serious problem. However, if it happens frequently or gets worse, it may be a sign of something more serious. What are the causes? This condition may be caused by:  Sudden changes in posture, such as standing up quickly after you have been sitting or lying down.  Blood loss.  Loss of body fluids (dehydration).  Heart problems.  Hormone (endocrine) problems.  Pregnancy.  Severe infection.  Lack of certain nutrients.  Severe allergic reactions (anaphylaxis).  Certain medicines, such as blood pressure medicine or medicines that make the body lose excess fluids (diuretics). Sometimes, this condition can be caused by not taking medicine as directed, such as taking too much of a certain medicine.  What increases the risk? Certain factors can make you more likely to develop orthostatic hypotension, including:  Age. Risk increases as you get older.  Conditions that affect the heart or the central nervous system.  Taking certain medicines, such as blood pressure medicine or diuretics.  Being pregnant.  What are the signs or symptoms? Symptoms of this condition may include:  Weakness.  Light-headedness.  Dizziness.  Blurred vision.  Fatigue.  Rapid heartbeat.  Fainting, in severe cases.  How is this  diagnosed? This condition is diagnosed based on:  Your medical history.  Your symptoms.  Your blood pressure measurement. Your health care provider will check your blood pressure when you are: ? Lying down. ? Sitting. ? Standing.  A blood pressure reading is recorded as two numbers, such as "120 over 80" (or 120/80). The first ("top") number is called the systolic pressure. It is a measure of the pressure in your arteries as your heart beats. The second ("bottom") number is called the diastolic pressure. It is a measure of the pressure in your arteries when your heart relaxes between beats. Blood pressure is measured in a unit called mm Hg. Healthy blood pressure for adults is 120/80. If your blood pressure is below 90/60, you may be diagnosed with hypotension. Other information or tests that may be used to diagnose orthostatic hypotension include:  Your other vital signs, such as your heart rate and temperature.  Blood tests.  Tilt table test. For this test, you will be safely secured to a table that moves you from a lying position to an upright position. Your heart rhythm and blood pressure will be monitored during the test.  How is this treated? Treatment for this condition may include:  Changing your diet. This may involve eating more salt (sodium) or drinking more water.  Taking medicines to raise your blood pressure.  Changing the dosage of certain medicines you are taking that might be lowering your blood pressure.  Wearing compression stockings. These stockings help to prevent blood clots and reduce swelling in your legs.  In some cases, you may need to go to the hospital for:    Fluid replacement. This means you will receive fluids through an IV tube.  Blood replacement. This means you will receive donated blood through an IV tube (transfusion).  Treating an infection or heart problems, if this applies.  Monitoring. You may need to be monitored while medicines that you  are taking wear off.  Follow these instructions at home: Eating and drinking   Drink enough fluid to keep your urine clear or pale yellow.  Eat a healthy diet and follow instructions from your health care provider about eating or drinking restrictions. A healthy diet includes: ? Fresh fruits and vegetables. ? Whole grains. ? Lean meats. ? Low-fat dairy products.  Eat extra salt only as directed. Do not add extra salt to your diet unless your health care provider told you to do that.  Eat frequent, small meals.  Avoid standing up suddenly after eating. Medicines  Take over-the-counter and prescription medicines only as told by your health care provider. ? Follow instructions from your health care provider about changing the dosage of your current medicines, if this applies. ? Do not stop or adjust any of your medicines on your own. General instructions  Wear compression stockings as told by your health care provider.  Get up slowly from lying down or sitting positions. This gives your blood pressure a chance to adjust.  Avoid hot showers and excessive heat as directed by your health care provider.  Return to your normal activities as told by your health care provider. Ask your health care provider what activities are safe for you.  Do not use any products that contain nicotine or tobacco, such as cigarettes and e-cigarettes. If you need help quitting, ask your health care provider.  Keep all follow-up visits as told by your health care provider. This is important. Contact a health care provider if:  You vomit.  You have diarrhea.  You have a fever for more than 2-3 days.  You feel more thirsty than usual.  You feel weak and tired. Get help right away if:  You have chest pain.  You have a fast or irregular heartbeat.  You develop numbness in any part of your body.  You cannot move your arms or your legs.  You have trouble speaking.  You become sweaty or feel  lightheaded.  You faint.  You feel short of breath.  You have trouble staying awake.  You feel confused. This information is not intended to replace advice given to you by your health care provider. Make sure you discuss any questions you have with your health care provider. Document Released: 01/01/2002 Document Revised: 09/30/2015 Document Reviewed: 07/04/2015 [employer]vier Interactive Patient Education  2018 [employer]  

## 2017-04-20 NOTE — Therapy (Signed)
[facility] [address] [place], [place], [address] Phone: [phone]   Fax:  [phone]  Physical Therapy Treatment  Patient Details  Name: Derek Carr MRN: [id number] Date of Birth: [birth year] Referring Provider: Dr [name]   Encounter Date: 04/20/2017  PT End of Session - 04/20/17 1413    Visit Number  6    Number of Visits  17    Date for PT Re-Evaluation  05/13/17    Authorization Type  A[employer]na Medicare & Generic commercial 2nd    PT Start Time  1405    PT Stop Time  1445    PT Time Calculation (min)  40 min    Equipment Utilized During Treatment  Gait belt    Activity Tolerance  Patient tolerated treatment well    Behavior During Therapy  [facility] for tasks assessed/performed       Past Medical History:  Diagnosis Date  . Anemia   . Atherosclerosis   . Complication of anesthesia    CONFUSION - Pt's family very concerned about this  . DDD (degenerative disc disease)   . Degenerative arthritis   . Dermatitis, atopic ARMS AND LEGS  . Diverticulosis   . Erectile dysfunction   . Frequency of urination   . Glaucoma   . History of asbestos exposure   . History of radiation therapy 8/19-13-10/18/11   prostate, 42 GY  . History of shingles 2012-- BILATERAL EYES--  NO RESIDUAL  . Hyperlipidemia   . Hypertension   . Inguinal hernia   . Lumbar scoliosis   . Nocturia   . OA (osteoarthritis) of hip RIGHT  . Prostate cancer ([place]) 05/11/2011   bx=Adenocarcinoma,gleason3+4=7, 4+4=8,PSA=10.30volume=45.7cc  . Renal cyst    bilateral  . Smokers' cough ([place])   . Thyroid cyst     Past Surgical History:  Procedure Laterality Date  . ATTEMPTED LEFT VATS/ LEFT THORACOTOMY/ RESECTION OF THE ENORMOUS, PROBABLE BRONCHOGENIC CYST  01-04-2005  DR Arlyce Dice  . CARDIAC CATHETERIZATION  02-24-2009  DR NASHER   MINOR CORONARY ARTERY IRREGULARITIES/ NORMAL LVSF/ EF 65-70%  . CATARACT EXTRACTION    . COLONOSCOPY W/  POLYPECTOMY    . CYSTOSCOPY  11/15/2011   Procedure: CYSTOSCOPY;  Surgeon: Dutch Gray, MD;  Location[facility];  Service: Urology;  Laterality: N/A;  no seeds seen in bladder  . esophageus cyst removal  YRS AGO  . [place]  . PROSTATE BIOPSY  05/11/11   Adenocarcinoma ([facility] OFFICE)  . RADIOACTIVE SEED IMPLANT  11/15/2011   Procedure: RADIOACTIVE SEED IMPLANT;  Surgeon: Dutch Gray, MD;  Location[facility];  Service: Urology;  Laterality: N/A;  Total number of seeds - 52  . REPAIR RIGHT INGUINAL HERNIA W/ MESH  09-10-1999  . TOTAL HIP ARTHROPLASTY Right 03/20/2013   Procedure: RIGHT TOTAL HIP ARTHROPLASTY ANTERIOR APPROACH;  Surgeon: [name], MD;  Location[facility];  Service: Orthopedics;  Laterality: Right;  . TOTAL HIP REVISION Right 05/14/2013   Procedure: open reduction internal fixation REVISION RIGHT HIP ;  Surgeon: [name], MD;  Location: [facility];  Service: Orthopedics;  Laterality: Right;  . UMBILICAL HERNIA REPAIR  1998   epigastric    Vitals:   04/20/17 1407  BP: 140/78  Pulse: 67    Subjective Assessment - 04/20/17 1412    Subjective  Pt reports no fall since last PT sesison. pt  daughter [name]) reports concerns of orthostatic hypotension &  she kindly asked PT to assess pt.     Patient is accompained by:  Family member    Pertinent History  TIA, DDD, Lumbar scoliosis, Glaucoma, cataract surgery, HTN, right THR 03/20/13 with revision 05/14/13, right femur fracture    Limitations  Standing;Walking;House hold activities    Patient Stated Goals  Patient wants to improve his balance & walking    Currently in Pain?  No/denies             Vestibular Assessment - 04/20/17 1405      Orthostatics   BP supine (x 5 minutes)  151/83    HR supine (x 5 minutes)  66    BP sitting  127/80 5/10 dizziness    HR sitting  73    BP standing (after 1 minute)  141/79 3-4/10 dizziness    HR standing (after 1 minute)  73     BP standing (after 3 minutes)  155/78 0/10 dizziness    HR standing (after 3 minutes)  72    Orthostatics Comment  moderate symptoms during changes         No data recorded       [facility] Adult PT Treatment/Exercise - 04/20/17 1405      Transfers   Transfers  Stand to Sit;Sit to Stand    Sit to Stand  5: Supervision;From chair/3-in-1;With armrests;Without upper extremity assist;With upper extremity assist    Stand to Sit  5: Supervision;With upper extremity assist;With armrests;To chair/3-in-1      Ambulation/Gait   Ambulation/Gait  Yes    Ambulation/Gait Assistance  5: Supervision;4: Min guard    Ambulation Distance (Feet)  250 Feet    Assistive device  Straight cane    Gait Pattern  Step-through pattern;Decreased arm swing - right;Decreased step length - right;Decreased step length - left;Decreased stride length;Decreased dorsiflexion - right;Trunk flexed;Poor foot clearance - right;Poor foot clearance - left;Decreased stance time - right;Decreased hip/knee flexion - right    Ambulation Surface  Indoor;Level          Balance Exercises - 04/20/17 1405      Balance Exercises: Standing   Stepping Strategy  Posterior;Anterior;Lateral;5 reps;Foam/compliant surface minA  L multiple stepps on all directions        PT Education - 04/20/17 1415    Education provided  Yes    Education Details  orthostatic hypotension, types of canes,  HEP review    Person(s) Educated  Patient;Child(ren)    Methods  Explanation;Demonstration;Handout    Comprehension  Verbalized understanding;Returned demonstration       PT Short Term Goals - 04/11/17 1547      PT SHORT TERM GOAL #1   Title  Pt will be independent with initial HEP exercises (All STGs Target Date 04/13/2017)    Baseline  MET(3/18) with the use of his print out    Time  1    Period  Months    Status  Achieved      PT SHORT TERM GOAL #2   Title  Pt will demonstrate ramp & Curb navigation Minimal Guard to improve confidence  and safety ambulating in home and community environment     Baseline  MET(3/18) ramps and curbs Supervision     Time  1    Period  Months    Status  Achieved      PT SHORT TERM GOAL #3   Title  Patient ambuates with LRAD scanning environment with no balance loss.     Baseline  MET(3/18) 200+ ambulation with scanning task with no LOB  and mild reduction in gait speed    Time  1    Period  Months    Status  Achieved      PT SHORT TERM GOAL #4   Title  Berg Balance >/= 30/56    Baseline  MET(3/18) Berg Balance 41/56, inital 24/56    Time  1    Period  Months    Status  Achieved      PT SHORT TERM GOAL #5   Title  Patient ambulates 200' with LRAD with supervision.     Baseline  MET(3/18) pt walked 200+ feet without rest breaks and with the [facility] with quad tip    Time  1    Period  Months    Status  Achieved        PT Long Term Goals - 04/13/17 1544      PT LONG TERM GOAL #1   Title  Pt will demonstrate independence with ongoing HEP program and fitness plan (All LTGs Target Date 05/13/2017)    Time  2    Period  Months    Status  On-going      PT LONG TERM GOAL #2   Title  Pt will demonstrate Mod I 500' ambulation outdoors including grass, pavement, curbs, ramps with LRAD to improve community ambulatory skills    Time  2    Period  Months    Status  On-going      PT LONG TERM GOAL #3   Title  Pt will demonstrate Berg > 45/56 in order to reduce falls risk & indicate less dependency in ADLs.    Baseline  Berg 24/56 (03/14/17),  41/56 on 04/11/2017    Time  2    Period  Months    Status  Revised      PT LONG TERM GOAL #4   Title  Patient performs Timed Up-Go time with cane <17 seconds to indicate lower fall risk.     Baseline  With cane 21.72 without cane 19.75(2/18)    Time  2    Period  Months    Status  On-going      PT LONG TERM GOAL #5   Title  Pt will negotiate 2 stairs without Rail assist with LRAD with family supervision to improve safety when entering/ exiting  home.     Time  2    Period  Months    Status  On-going            Plan - 04/20/17 1519    Clinical Impression Statement  During today's PT session patients daughter [name]) accompanied him with concerns of orthostatic hypotension and requested her BP machine be compared to PTs to insure accuracy. Patient continues to be positive for orthostatic hypotension both based on drop in systemic BP and symptomatic report after changes in positon. This is an improvement from check 3 weeks ago. The systolic, diastolic and symptoms were greater 3 weeks ago. Patient then continued to demonstrate his improvements in his dynamic balance with scanning tasks (mild reduction in speed with increase task difficulty). Pt continues to have difficulty with stepping strategy with left stepping but did show significant improvement from last session, patient continues to require min A correction.     Rehab Potential  Good    Clinical Impairments Affecting Rehab Potential  progressive decline over 2 years, good attitude and family support with 4 children in area.  PT Frequency  2x / week    PT Duration  Other (comment)    PT Treatment/Interventions  Gait training;Functional mobility training;Neuromuscular re-education;Balance training;Therapeutic exercise;Therapeutic activities;Patient/family education;ADLs/Self Care Home Management;Stair training;Orthotic Fit/Training;Manual techniques;Vestibular    PT Next Visit Plan  Review orthostatic hypotension precautions ( take breaks during position changes). continue to progress dynamic gait tasks, stepping strategy, and high level balance activity    Consulted and Agree with Plan of Care  Patient    Family Member Consulted  daughter [name])       Patient will benefit from skilled therapeutic intervention in order to improve the following deficits and impairments:  Abnormal gait, Decreased balance, Decreased endurance, Decreased mobility, Difficulty walking, Decreased  activity tolerance, Decreased strength, Decreased safety awareness  Visit Diagnosis: Difficulty in walking, not elsewhere classified  Muscle weakness (generalized)  Unsteadiness on feet  Dizziness and giddiness  Other abnormalities of gait and mobility  Pain in left leg     Problem List Patient Active Problem List   Diagnosis Date Noted  . Axonal neuropathy 03/21/2017  . Right sided weakness 03/01/2017  . Cigarette smoker 06/16/2016  . Dyspnea on exertion 06/15/2016  . Alzheimer disease 01/07/2014  . CSA (central sleep apnea) 10/05/2013  . Syncope 06/26/2013  . COPD GOLD II 06/25/2013  . BPH (benign prostatic hyperplasia) 05/18/2013  . Glaucoma 05/18/2013  . S/P right TH revision 05/14/2013  . Constipation 03/26/2013  . Osteoporosis 03/26/2013  . Expected blood loss anemia 03/23/2013  . S/P right THA, AA 03/20/2013  . Hyperlipidemia   . Erectile dysfunction   . History of asbestos exposure   . Nocturia   . History of shingles   . Dermatitis, atopic   . OA (osteoarthritis) of hip   . Arthritis   . Frequency of urination   . Lumbar scoliosis   . Malignant neoplasm of prostate ([place]) 06/07/2011  . 84 year old gentleman with stage T3 adenocarcinoma prostate with Gleason score 4+4 and PSA of 10.3 05/11/2011    Waunita Schooner SPT 04/20/2017, 3:22 PM  Jamey Reas, PT, DPT PT Specializing in [place] 04/20/17 9:50 PM Phone:  [phone]  Fax:  [phone] [facility] [address] [place], [place] [address]   [facility] [address] [place], [place], [address] Phone: [phone]   Fax:  [phone]  Name: Derek Carr MRN: [id number] Date of Birth: 08-May-1931

## 2017-04-20 NOTE — Therapy (Signed)
[facility] [address] [place], [place], [address] Phone: [phone]   Fax:  [phone]  Occupational Therapy Treatment  Patient Details  Name: Derek Carr MRN: [id number] Date of Birth: April 01, 1931 Referring Provider: Dr [name]   Encounter Date: 04/20/2017  OT End of Session - 04/20/17 1628    Visit Number  8    Number of Visits  17    Authorization Type  Aetna MCR    OT Start Time  7416    OT Stop Time  1530    OT Time Calculation (min)  43 min    Activity Tolerance  Patient tolerated treatment well    Behavior During Therapy  [facility] for tasks assessed/performed       Past Medical History:  Diagnosis Date  . Anemia   . Atherosclerosis   . Complication of anesthesia    CONFUSION - Pt's family very concerned about this  . DDD (degenerative disc disease)   . Degenerative arthritis   . Dermatitis, atopic ARMS AND LEGS  . Diverticulosis   . Erectile dysfunction   . Frequency of urination   . Glaucoma   . History of asbestos exposure   . History of radiation therapy 8/19-13-10/18/11   prostate, 55 GY  . History of shingles 2012-- BILATERAL EYES--  NO RESIDUAL  . Hyperlipidemia   . Hypertension   . Inguinal hernia   . Lumbar scoliosis   . Nocturia   . OA (osteoarthritis) of hip RIGHT  . Prostate cancer ([place]) 05/11/2011   bx=Adenocarcinoma,gleason3+4=7, 4+4=8,PSA=10.30volume=45.7cc  . Renal cyst    bilateral  . Smokers' cough ([place])   . Thyroid cyst     Past Surgical History:  Procedure Laterality Date  . ATTEMPTED LEFT VATS/ LEFT THORACOTOMY/ RESECTION OF THE ENORMOUS, PROBABLE BRONCHOGENIC CYST  01-04-2005  DR Arlyce Dice  . CARDIAC CATHETERIZATION  02-24-2009  DR NASHER   MINOR CORONARY ARTERY IRREGULARITIES/ NORMAL LVSF/ EF 65-70%  . CATARACT EXTRACTION    . COLONOSCOPY W/ POLYPECTOMY    . CYSTOSCOPY  11/15/2011   Procedure: CYSTOSCOPY;  Surgeon: Dutch Gray, MD;  Location: [facility];  Service: Urology;  Laterality: N/A;  no seeds seen in bladder  . esophageus cyst removal  YRS AGO  . [place]  . PROSTATE BIOPSY  05/11/11   Adenocarcinoma ([facility] OFFICE)  . RADIOACTIVE SEED IMPLANT  11/15/2011   Procedure: RADIOACTIVE SEED IMPLANT;  Surgeon: Dutch Gray, MD;  Location: [facility];  Service: Urology;  Laterality: N/A;  Total number of seeds - 52  . REPAIR RIGHT INGUINAL HERNIA W/ MESH  09-10-1999  . TOTAL HIP ARTHROPLASTY Right 03/20/2013   Procedure: RIGHT TOTAL HIP ARTHROPLASTY ANTERIOR APPROACH;  Surgeon: [name], MD;  [facility];  Service: Orthopedics;  Laterality: Right;  . TOTAL HIP REVISION Right 05/14/2013   Procedure: open reduction internal fixation REVISION RIGHT HIP ;  Surgeon: [name], MD;  Location: [facility];  Service: Orthopedics;  Laterality: Right;  . UMBILICAL HERNIA REPAIR  1998   epigastric    There were no vitals filed for this visit.      [facility] OT Assessment - 04/20/17 0001      Coordination   Right 9 Hole Peg Test  31 sec       Vestibular Assessment - 04/20/17 1405      Orthostatics   BP supine (x 5 minutes)  151/83    HR  supine (x 5 minutes)  66    BP sitting  127/80 5/10 dizziness    HR sitting  73    BP standing (after 1 minute)  141/79 3-4/10 dizziness    HR standing (after 1 minute)  73    BP standing (after 3 minutes)  155/78 0/10 dizziness    HR standing (after 3 minutes)  72    Orthostatics Comment  moderate symptoms during changes              OT Treatments/Exercises (OP) - 04/20/17 1527      ADLs   UB Dressing  Patient reports more ease with dressing since new glasses.  Able to button and zip without any difficulty.       Functional Mobility  Patient's daughter here today, and both she and he recognize improvements in coordination and balance.  Patient now with new glasses, and has noticed significant improvement with functional mobility and balance.       Leisure  Discussed at length how to help patient return to community exercise with support.  Daughters willing to take patient to walk in pool at [facility].            Balance Exercises - 04/20/17 1405      Balance Exercises: Standing   Stepping Strategy  Posterior;Anterior;Lateral;5 reps;Foam/compliant surface minA  L multiple stepps on all directions        OT Education - 04/20/17 1628    Education provided  Yes    Education Details  strategies to return to community exercise    Person(s) Educated  Patient;Child(ren)    Methods  Explanation    Comprehension  Verbalized understanding       OT Short Term Goals - 04/20/17 1506      OT SHORT TERM GOAL #5   Title  Patient will utilize compensatory strategy to effectively utilize pill box for am and pm medications with supervision.      Status  Achieved        OT Long Term Goals - 04/20/17 1506      OT LONG TERM GOAL #1   Title  Patient will effectively use simple hand tools in standing (screw driver, wrench, hammer) with supervision in preparation for returning to shop (due 4/19)    Status  Achieved      OT LONG TERM GOAL #2   Status  Deferred Upon review- patient did not do true cooking prior to this TIA      OT LONG TERM GOAL #3   Title  Patient will reduce speed on 9 hole peg test by 10 seconds to improve functional coordination during ADL/IADL- tool use.        OT LONG TERM GOAL #4   Title  Patient will demonstrate adequate environmental visual scanning and functional mobility to safely navigate through crowded hallway, or busy retail store with supervision.      Status  Achieved      OT LONG TERM GOAL #5   Title  Patient will return to community exercise program with assistance from family member    Status  Deferred            Plan - 04/20/17 1629    Clinical Impression Statement  Patient has met all goals and is agreeable to OT discharge.  Patient now with new eyeglasses as well as noteable  improvement in coordination and balance.      Occupational Profile and client history currently impacting functional performance  Patient is married - wife has dementia, father - 4 children live nearby, retired [employer], used to exercise at least 3 days/week - walking class, and water exercises, patient enjoys outdoor work, and working on cars in his shop.      Occupational performance deficits (Please refer to evaluation for details):  ADL's;IADL's;Leisure;Social Participation    Rehab Potential  Good    OT Frequency  2x / week    OT Duration  8 weeks    OT Treatment/Interventions  Self-care/ADL training;Energy conservation;Visual/perceptual remediation/compensation;Aquatic Therapy;DME and/or AE instruction;Patient/family education;Balance training;Functional Furniture conservator/restorer;Therapeutic exercise;Therapeutic activities;Neuromuscular education    Plan  discharge    Clinical Decision Making  Limited treatment options, no task modification necessary    Consulted and Agree with Plan of Care  Patient    Family Member Consulted  daughter Derek Carr       Patient will benefit from skilled therapeutic intervention in order to improve the following deficits and impairments:  Impaired vision/preception, Improper body mechanics, Impaired perceived functional ability, Decreased activity tolerance, Decreased knowledge of use of DME, Decreased strength, Impaired sensation, Decreased mobility, Decreased balance, Decreased cognition, Decreased safety awareness  Visit Diagnosis: Unsteadiness on feet  Muscle weakness (generalized)  Other lack of coordination  Cognitive social or emotional deficit following other cerebrovascular disease  Visuospatial deficit  Dizziness and giddiness    Problem List Patient Active Problem List   Diagnosis Date Noted  . Axonal neuropathy 03/21/2017  . Right sided weakness 03/01/2017  . Cigarette smoker 06/16/2016  . Dyspnea on exertion 06/15/2016  . Alzheimer  disease 01/07/2014  . CSA (central sleep apnea) 10/05/2013  . Syncope 06/26/2013  . COPD GOLD II 06/25/2013  . BPH (benign prostatic hyperplasia) 05/18/2013  . Glaucoma 05/18/2013  . S/P right TH revision 05/14/2013  . Constipation 03/26/2013  . Osteoporosis 03/26/2013  . Expected blood loss anemia 03/23/2013  . S/P right THA, AA 03/20/2013  . Hyperlipidemia   . Erectile dysfunction   . History of asbestos exposure   . Nocturia   . History of shingles   . Dermatitis, atopic   . OA (osteoarthritis) of hip   . Arthritis   . Frequency of urination   . Lumbar scoliosis   . Malignant neoplasm of prostate ([place]) 06/07/2011  . 84 year old gentleman with stage T3 adenocarcinoma prostate with Gleason score 4+4 and PSA of 10.3 05/11/2011    Mariah Milling, OTR/L 04/20/2017, 4:31 PM  [facility] [address] [place], [place], [address] Phone: [phone]   Fax:  [phone]  Name: Derek Carr MRN: [id number] Date of Birth: [birth year]

## 2017-04-21 ENCOUNTER — Ambulatory Visit: Payer: Medicare HMO | Admitting: Speech Pathology

## 2017-04-21 DIAGNOSIS — M25572 Pain in left ankle and joints of left foot: Secondary | ICD-10-CM | POA: Diagnosis not present

## 2017-04-21 NOTE — Therapy (Signed)
[facility] [address] [place], [place], [address] Phone: [phone]   Fax:  [phone]  Speech Language Pathology Treatment  Patient Details  Name: LADARRIOUS KIRKSEY MRN: [id number] Date of Birth: 82/01/1931 Referring Provider: [name], MD   Encounter Date: 82/27/2019  End of Session - 04/21/17 1025    Visit Number  6    Number of Visits  17    Date for SLP Re-Evaluation  05/20/17    SLP Start Time  10    SLP Stop Time   1400    SLP Time Calculation (min)  42 min    Activity Tolerance  Patient tolerated treatment well       Past Medical History:  Diagnosis Date  . Anemia   . Atherosclerosis   . Complication of anesthesia    CONFUSION - Pt's family very concerned about this  . DDD (degenerative disc disease)   . Degenerative arthritis   . Dermatitis, atopic ARMS AND LEGS  . Diverticulosis   . Erectile dysfunction   . Frequency of urination   . Glaucoma   . History of asbestos exposure   . History of radiation therapy 8/19-13-10/18/11   prostate, 2 GY  . History of shingles 2012-- BILATERAL EYES--  NO RESIDUAL  . Hyperlipidemia   . Hypertension   . Inguinal hernia   . Lumbar scoliosis   . Nocturia   . OA (osteoarthritis) of hip RIGHT  . Prostate cancer ([place]) 05/11/2011   bx=Adenocarcinoma,gleason3+4=7, 4+4=8,PSA=10.30volume=45.7cc  . Renal cyst    bilateral  . Smokers' cough ([place])   . Thyroid cyst     Past Surgical History:  Procedure Laterality Date  . ATTEMPTED LEFT VATS/ LEFT THORACOTOMY/ RESECTION OF THE ENORMOUS, PROBABLE BRONCHOGENIC CYST  01-04-2005  DR Arlyce Dice  . CARDIAC CATHETERIZATION  02-24-2009  DR NASHER   MINOR CORONARY ARTERY IRREGULARITIES/ NORMAL LVSF/ EF 65-70%  . CATARACT EXTRACTION    . COLONOSCOPY W/ POLYPECTOMY    . CYSTOSCOPY  11/15/2011   Procedure: CYSTOSCOPY;  Surgeon: Dutch Gray, MD;  Location: [facility];  Service: Urology;  Laterality: N/A;   no seeds seen in bladder  . esophageus cyst removal  YRS AGO  . [place]  . PROSTATE BIOPSY  05/11/11   Adenocarcinoma (MD OFFICE)  . RADIOACTIVE SEED IMPLANT  11/15/2011   Procedure: RADIOACTIVE SEED IMPLANT;  Surgeon: Dutch Gray, MD;  Location: [facility];  Service: Urology;  Laterality: N/A;  Total number of seeds - 52  . REPAIR RIGHT INGUINAL HERNIA W/ MESH  09-10-1999  . TOTAL HIP ARTHROPLASTY Right 03/20/2013   Procedure: RIGHT TOTAL HIP ARTHROPLASTY ANTERIOR APPROACH;  Surgeon: [name], MD;  Location: [facility];  Service: Orthopedics;  Laterality: Right;  . TOTAL HIP REVISION Right 05/14/2013   Procedure: open reduction internal fixation REVISION RIGHT HIP ;  Surgeon: [name], MD;  Location: [facility];  Service: Orthopedics;  Laterality: Right;  . UMBILICAL HERNIA REPAIR  1998   epigastric    There were no vitals filed for this visit.  Subjective Assessment - 04/20/17 1324    Subjective  Daughter arrived with pt today.    Patient is accompained by:  -- daughter. Robyn    Currently in Pain?  No/denies            ADULT SLP TREATMENT - 04/21/17 0001      General Information   Behavior/Cognition  Alert;Cooperative;Requires cueing;Pleasant mood  Treatment Provided   Treatment provided  Dysphagia      Dysphagia Treatment   Temperature Spikes Noted  No    Respiratory Status  Room air    Treatment Methods  Skilled observation;Therapeutic exercise;Compensation strategy training;Patient/caregiver education    Other treatment/comments  SLP observed daughter perform exercises with pt and SLP provided occasional min A for procedural correction for daughter and pt. SLP andn daughter decided that all hold times should be 7 seconds to lessen pt confusion/improve pt recall. Pt req'd mod cues consistently for swallow precautions - pt did not take double swallows. SLP encouraged daughter to cont to perform exercises with pt, and strongly  suggested someone be there during pt mealtimes to remind him of precautions.       Pain Assessment   Pain Assessment  No/denies pain      Assessment / Recommendations / Plan   Plan  Continue with current plan of care      Progression Toward Goals   Progression toward goals  Progressing toward goals       SLP Education - 04/21/17 1025    Education provided  Yes    Education Details  HEP procedure, swallow precautions    Person(s) Educated  Patient;Child(ren)    Methods  Explanation;Demonstration;Verbal cues    Comprehension  Verbalized understanding;Need further instruction;Verbal cues required;Returned demonstration       SLP Short Term Goals - 04/21/17 1036      SLP SHORT TERM GOAL #1   Title  pt will complete HEP for dysarthria and dysphagia with occasional mod A over three sessions    Time  3    Period  Weeks    Status  On-going      SLP SHORT TERM GOAL #2   Title  pt will follow swallow precautions with POs during session with occasional min A over two sessions    Time  3    Period  Weeks    Status  On-going      SLP SHORT TERM GOAL #3   Title  pt/family will acknowledge knowledge of 3 overt s/s aspiration PNA    Time  3    Period  Weeks    Status  On-going      SLP SHORT TERM GOAL #4   Title  pt will generate loud /a/ at average low-mid 80s over 3 sessions    Baseline  04-13-17, 04-20-17    Time  3    Period  Weeks    Status  On-going       SLP Long Term Goals - 04/21/17 1039      SLP LONG TERM GOAL #1   Title  pt will demo swallow and oral-motor HEP with rare min A over two sessions    Time  7    Period  Weeks    Status  On-going      SLP LONG TERM GOAL #2   Title  pt will complete loud /a/ with average low to mid 80s dB over 5 sessions    Time  7    Period  Weeks    Status  On-going      SLP LONG TERM GOAL #3   Title  pt will use average speech loudness of low 70s dB in 5 minutes simple conversation over two sessions    Time  7    Period  Weeks     Status  On-going       Plan - 04/21/17 1026  Clinical Impression Statement  Arrived with daughter ST today. Pt continues with swallowing deficits, cognitive communication deficits. Cognitive linguistic deficits are negatively affecting pt's ability to complete HEP for swallowing and his ability to recall compensations. See skilled intervention formore details. He would benefit from skilled ST to address swallowing and dysarthria for safety with POs as well as ease of communication.     Speech Therapy Frequency  2x / week    Treatment/Interventions  Aspiration precaution training;Pharyngeal strengthening exercises;Diet toleration management by SLP;Internal/external aids;Patient/family education;Compensatory strategies;SLP instruction and feedback;Oral motor exercises;Cueing hierarchy;Environmental controls;Cognitive reorganization    Potential to Achieve Goals  Fair    Potential Considerations  Previous level of function;Ability to learn/carryover information    Consulted and Agree with Plan of Care  Patient       Patient will benefit from skilled therapeutic intervention in order to improve the following deficits and impairments:   Dysphagia, oropharyngeal  Dysarthria and anarthria  Cognitive communication deficit    Problem List Patient Active Problem List   Diagnosis Date Noted  . Axonal neuropathy 03/21/2017  . Right sided weakness 03/01/2017  . Cigarette smoker 06/16/2016  . Dyspnea on exertion 06/15/2016  . Alzheimer disease 01/07/2014  . CSA (central sleep apnea) 10/05/2013  . Syncope 06/26/2013  . COPD GOLD II 06/25/2013  . BPH (benign prostatic hyperplasia) 05/18/2013  . Glaucoma 05/18/2013  . S/P right TH revision 05/14/2013  . Constipation 03/26/2013  . Osteoporosis 03/26/2013  . Expected blood loss anemia 03/23/2013  . S/P right THA, AA 03/20/2013  . Hyperlipidemia   . Erectile dysfunction   . History of asbestos exposure   . Nocturia   . History of  shingles   . Dermatitis, atopic   . OA (osteoarthritis) of hip   . Arthritis   . Frequency of urination   . Lumbar scoliosis   . Malignant neoplasm of prostate ([place]) 06/07/2011  . 84 year old gentleman with stage T3 adenocarcinoma prostate with Gleason score 4+4 and PSA of 10.3 05/11/2011    [facility] ,MS, CCC-SLP  04/21/2017, 10:40 AM  [facility] [address] [place], [place], [address] Phone: [phone]   Fax:  [phone]   Name: SAJAN CHEATWOOD MRN: [id number] Date of Birth: 19-Oct-1931

## 2017-04-26 ENCOUNTER — Ambulatory Visit: Payer: Medicare HMO | Attending: Internal Medicine | Admitting: Physical Therapy

## 2017-04-26 ENCOUNTER — Ambulatory Visit: Payer: Medicare HMO | Admitting: Speech Pathology

## 2017-04-26 ENCOUNTER — Encounter: Payer: Medicare HMO | Admitting: Occupational Therapy

## 2017-04-26 ENCOUNTER — Ambulatory Visit: Payer: Medicare HMO

## 2017-04-26 ENCOUNTER — Encounter: Payer: Self-pay | Admitting: Physical Therapy

## 2017-04-26 DIAGNOSIS — M6281 Muscle weakness (generalized): Secondary | ICD-10-CM | POA: Diagnosis not present

## 2017-04-26 DIAGNOSIS — R262 Difficulty in walking, not elsewhere classified: Secondary | ICD-10-CM

## 2017-04-26 DIAGNOSIS — R41841 Cognitive communication deficit: Secondary | ICD-10-CM | POA: Diagnosis not present

## 2017-04-26 DIAGNOSIS — R2689 Other abnormalities of gait and mobility: Secondary | ICD-10-CM | POA: Insufficient documentation

## 2017-04-26 DIAGNOSIS — R471 Dysarthria and anarthria: Secondary | ICD-10-CM | POA: Insufficient documentation

## 2017-04-26 DIAGNOSIS — R1312 Dysphagia, oropharyngeal phase: Secondary | ICD-10-CM

## 2017-04-26 DIAGNOSIS — R42 Dizziness and giddiness: Secondary | ICD-10-CM | POA: Diagnosis not present

## 2017-04-26 DIAGNOSIS — R2681 Unsteadiness on feet: Secondary | ICD-10-CM | POA: Insufficient documentation

## 2017-04-26 DIAGNOSIS — M79605 Pain in left leg: Secondary | ICD-10-CM | POA: Diagnosis not present

## 2017-04-26 NOTE — Therapy (Signed)
[facility] [address] [place], [place], [address] Phone: [phone]   Fax:  [phone]  Physical Therapy Treatment  Patient Details  Name: Derek Carr MRN: [id number] Date of Birth: Dec 26, 1931 Referring Provider: Dr [name]   Encounter Date: 04/26/2017  PT End of Session - 04/26/17 1343    Visit Number  7    Number of Visits  17    Date for PT Re-Evaluation  05/13/17    Authorization Type  [employer] & Generic commercial 2nd    PT Start Time  1315    PT Stop Time  1400    PT Time Calculation (min)  45 min    Activity Tolerance  Treatment limited secondary to medical complications (Comment)    Behavior During Therapy  [facility] for tasks assessed/performed       Past Medical History:  Diagnosis Date  . Anemia   . Atherosclerosis   . Complication of anesthesia    CONFUSION - Pt's family very concerned about this  . DDD (degenerative disc disease)   . Degenerative arthritis   . Dermatitis, atopic ARMS AND LEGS  . Diverticulosis   . Erectile dysfunction   . Frequency of urination   . Glaucoma   . History of asbestos exposure   . History of radiation therapy 8/19-13-10/18/11   prostate, 50 GY  . History of shingles 2012-- BILATERAL EYES--  NO RESIDUAL  . Hyperlipidemia   . Hypertension   . Inguinal hernia   . Lumbar scoliosis   . Nocturia   . OA (osteoarthritis) of hip RIGHT  . Prostate cancer ([place]) 05/11/2011   bx=Adenocarcinoma,gleason3+4=7, 4+4=8,PSA=10.30volume=45.7cc  . Renal cyst    bilateral  . Smokers' cough ([place])   . Thyroid cyst     Past Surgical History:  Procedure Laterality Date  . ATTEMPTED LEFT VATS/ LEFT THORACOTOMY/ RESECTION OF THE ENORMOUS, PROBABLE BRONCHOGENIC CYST  01-04-2005  DR Arlyce Dice  . CARDIAC CATHETERIZATION  02-24-2009  DR NASHER   MINOR CORONARY ARTERY IRREGULARITIES/ NORMAL LVSF/ EF 65-70%  . CATARACT EXTRACTION    . COLONOSCOPY W/ POLYPECTOMY    . CYSTOSCOPY   11/15/2011   Procedure: CYSTOSCOPY;  Surgeon: Dutch Gray, MD;  Location: [facility];  Service: Urology;  Laterality: N/A;  no seeds seen in bladder  . esophageus cyst removal  YRS AGO  . [place]  . PROSTATE BIOPSY  05/11/11   Adenocarcinoma (MD OFFICE)  . RADIOACTIVE SEED IMPLANT  11/15/2011   Procedure: RADIOACTIVE SEED IMPLANT;  Surgeon: Dutch Gray, MD;  Location: [facility];  Service: Urology;  Laterality: N/A;  Total number of seeds - 52  . REPAIR RIGHT INGUINAL HERNIA W/ MESH  09-10-1999  . TOTAL HIP ARTHROPLASTY Right 03/20/2013   Procedure: RIGHT TOTAL HIP ARTHROPLASTY ANTERIOR APPROACH;  Surgeon: [name], MD;  Location: [facility];  Service: Orthopedics;  Laterality: Right;  . TOTAL HIP REVISION Right 05/14/2013   Procedure: open reduction internal fixation REVISION RIGHT HIP ;  Surgeon: [name], MD;  Location: [facility];  Service: Orthopedics;  Laterality: Right;  . UMBILICAL HERNIA REPAIR  1998   epigastric    There were no vitals filed for this visit.  Subjective Assessment - 04/26/17 1325    Subjective  Pt ambulates in to clinic with daughter using a RW and having L foot/ankle in a cast. Pt reports he fell on 3/27 when he was stepping out of the  truck. he opened the door and stepped on a rock and rolled his ankle and fell. pt reports he heard a pop and his L ankle was pointing a different direction. Pt went to MD on Thursday 3/28 pt reports he was instructed to only elevate his LLE. Pt and daughter report no information about pt current weight bearing status. pt has a cast on L ankle and has reported surgery on 4/9 to address L ankle fracture.  Pt reports he has been trying to keep resting over the weekend and keep the leg elevated as he was instructed.    Pertinent History  TIA, DDD, Lumbar scoliosis, Glaucoma, cataract surgery, HTN, right THR 03/20/13 with revision 05/14/13, right femur fracture    Limitations   Standing;Walking;House hold activities    Patient Stated Goals  Patient wants to improve his balance & walking    Currently in Pain?  No/denies                       [facility] Adult PT Treatment/Exercise - 04/26/17 1320      Transfers   Transfers  Stand Pivot Transfers    Sit to Stand  --    Sit to Stand Details  --    Sit to Stand Details (indicate cue type and reason)  --    Stand Pivot Transfers  5: Supervision    Stand Pivot Transfer Details (indicate cue type and reason)  PT demo, verbal cues, tactile cues for pattern and non weight bearing       Ambulation/Gait   Ambulation/Gait  No      Self-Care   Self-Care  RICE    RICE  elevation of LLE to reduce swelling             PT Education - 04/26/17 1342    Education provided  Yes    Education Details  weight bearing precautions, and RICE protocol, Pivot transfers for bed and car transfers with non-WB on LLE,     Person(s) Educated  Patient;Child(ren)    Methods  Explanation;Demonstration;Verbal cues    Comprehension  Verbalized understanding;Returned demonstration       PT Short Term Goals - 04/11/17 1547      PT SHORT TERM GOAL #1   Title  Pt will be independent with initial HEP exercises (All STGs Target Date 04/13/2017)    Baseline  MET(3/18) with the use of his print out    Time  1    Period  Months    Status  Achieved      PT SHORT TERM GOAL #2   Title  Pt will demonstrate ramp & Curb navigation Minimal Guard to improve confidence and safety ambulating in home and community environment     Baseline  MET(3/18) ramps and curbs Supervision     Time  1    Period  Months    Status  Achieved      PT SHORT TERM GOAL #3   Title  Patient ambuates with LRAD scanning environment with no balance loss.     Baseline  MET(3/18) 200+ ambulation with scanning task with no LOB  and mild reduction in gait speed    Time  1    Period  Months    Status  Achieved      PT SHORT TERM GOAL #4   Title  Berg  Balance >/= 30/56    Baseline  MET(3/18) Berg Balance 41/56, inital 24/56    Time  1    Period  Months    Status  Achieved      PT SHORT TERM GOAL #5   Title  Patient ambulates 200' with LRAD with supervision.     Baseline  MET(3/18) pt walked 200+ feet without rest breaks and with the [facility] with quad tip    Time  1    Period  Months    Status  Achieved        PT Long Term Goals - 04/13/17 1544      PT LONG TERM GOAL #1   Title  Pt will demonstrate independence with ongoing HEP program and fitness plan (All LTGs Target Date 05/13/2017)    Time  2    Period  Months    Status  On-going      PT LONG TERM GOAL #2   Title  Pt will demonstrate Mod I 500' ambulation outdoors including grass, pavement, curbs, ramps with LRAD to improve community ambulatory skills    Time  2    Period  Months    Status  On-going      PT LONG TERM GOAL #3   Title  Pt will demonstrate Berg > 45/56 in order to reduce falls risk & indicate less dependency in ADLs.    Baseline  Berg 24/56 (03/14/17),  41/56 on 04/11/2017    Time  2    Period  Months    Status  Revised      PT LONG TERM GOAL #4   Title  Patient performs Timed Up-Go time with cane <17 seconds to indicate lower fall risk.     Baseline  With cane 21.72 without cane 19.75(2/18)    Time  2    Period  Months    Status  On-going      PT LONG TERM GOAL #5   Title  Pt will negotiate 2 stairs without Rail assist with LRAD with family supervision to improve safety when entering/ exiting home.     Time  2    Period  Months    Status  On-going            Plan - 04/26/17 1544    Clinical Impression Statement  Due to pt recent Lt. ankle fracture Patient has been discharge  for current Plan of care( change in medical status). Patient arrived to PT  with his daughter and ambulating with a L ankle cast and RW. Pt was promptly placed in a wheelchair and brought into the treatment area. PT contacted patients orthopedist in order to confirm patients  weight bearing status after patient and daughter were unable to relay this information from the MD visit. PT informed patient will continue to be (non-weight bearing) NWB on his LLE until his surgical date (05/03/2017). Patient and daughter were then educated on NWB status and assistive devices patient is using and safety moving forward in reducing falls risk. After patient and daughter were  shown a stand pivot transfer patient was able to complete transfer at supervision level with minimal verbal cues maintain NWB in LLE. Patient and daughter were educated in patients current change in medical status and PT will be stopped at this time. Patient and daughter educated to request referral for continued PT following surgery to establish new PT plan of care    Clinical Impairments Affecting Rehab Potential  progressive decline over 2 years, good attitude and family support with 4 children in area.     PT Next Visit Plan  D/C due to change in medical status     Consulted and Agree with Plan of Care  Patient    Family Member Consulted  daughter [name])       Patient will benefit from skilled therapeutic intervention in order to improve the following deficits and impairments:     Visit Diagnosis: Dysphagia, oropharyngeal  Pain in left leg  Difficulty in walking, not elsewhere classified  Muscle weakness (generalized)  Unsteadiness on feet  Other abnormalities of gait and mobility  Dizziness and giddiness     Problem List Patient Active Problem List   Diagnosis Date Noted  . Axonal neuropathy 03/21/2017  . Right sided weakness 03/01/2017  . Cigarette smoker 06/16/2016  . Dyspnea on exertion 06/15/2016  . Alzheimer disease 01/07/2014  . CSA (central sleep apnea) 10/05/2013  . Syncope 06/26/2013  . COPD GOLD II 06/25/2013  . BPH (benign prostatic hyperplasia) 05/18/2013  . Glaucoma 05/18/2013  . S/P right TH revision 05/14/2013  . Constipation 03/26/2013  . Osteoporosis 03/26/2013  .  Expected blood loss anemia 03/23/2013  . S/P right THA, AA 03/20/2013  . Hyperlipidemia   . Erectile dysfunction   . History of asbestos exposure   . Nocturia   . History of shingles   . Dermatitis, atopic   . OA (osteoarthritis) of hip   . Arthritis   . Frequency of urination   . Lumbar scoliosis   . Malignant neoplasm of prostate ([place]) 06/07/2011  . 84 year old gentleman with stage T3 adenocarcinoma prostate with Gleason score 4+4 and PSA of 10.3 05/11/2011     PHYSICAL THERAPY DISCHARGE SUMMARY  Visits from Start of Care:7    Current functional level related to goals / functional outcomes: see above     Remaining deficits: See above     Education / Equipment: Pt ed on weight bearing status, Rice and elevation, NWB stand pivot transfers   Plan: Patient agrees to discharge.  Patient goals were not met. Patient is being discharged due to a change in medical status.  ?????         Waunita Schooner  SPT 04/26/2017, 3:45 PM  Jamey Reas, PT, DPT PT Specializing in [place] 04/26/17 4:47 PM Phone:  [phone]  Fax:  [phone] [place], [place] [address]  [facility] [address] [place], [place], [address] Phone: [phone]   Fax:  [phone]  Name: Derek Carr MRN: [id number] Date of Birth: [birth year]

## 2017-04-26 NOTE — Therapy (Signed)
[facility] [address] [place], [place], [address] Phone: [phone]   Fax:  [phone]  Speech Language Pathology Treatment  Patient Details  Name: Derek Carr MRN: [id number] Date of Birth: August 23, 1931 (84) Referring Provider: [name], MD   Encounter Date: 04/26/2017  End of Session - 04/26/17 1517    Visit Number  7    Number of Visits  17    Date for SLP Re-Evaluation  05/20/17    SLP Start Time  1404    SLP Stop Time   6384    SLP Time Calculation (min)  41 min       Past Medical History:  Diagnosis Date  . Anemia   . Atherosclerosis   . Complication of anesthesia    CONFUSION - Pt's family very concerned about this  . DDD (degenerative disc disease)   . Degenerative arthritis   . Dermatitis, atopic ARMS AND LEGS  . Diverticulosis   . Erectile dysfunction   . Frequency of urination   . Glaucoma   . History of asbestos exposure   . History of radiation therapy 8/19-13-10/18/11   prostate, 57 GY  . History of shingles 2012-- BILATERAL EYES--  NO RESIDUAL  . Hyperlipidemia   . Hypertension   . Inguinal hernia   . Lumbar scoliosis   . Nocturia   . OA (osteoarthritis) of hip RIGHT  . Prostate cancer ([place]) 05/11/2011   bx=Adenocarcinoma,gleason3+4=7, 4+4=8,PSA=10.30volume=45.7cc  . Renal cyst    bilateral  . Smokers' cough ([place])   . Thyroid cyst     Past Surgical History:  Procedure Laterality Date  . ATTEMPTED LEFT VATS/ LEFT THORACOTOMY/ RESECTION OF THE ENORMOUS, PROBABLE BRONCHOGENIC CYST  01-04-2005  DR Arlyce Dice  . CARDIAC CATHETERIZATION  02-24-2009  DR NASHER   MINOR CORONARY ARTERY IRREGULARITIES/ NORMAL LVSF/ EF 65-70%  . CATARACT EXTRACTION    . COLONOSCOPY W/ POLYPECTOMY    . CYSTOSCOPY  11/15/2011   Procedure: CYSTOSCOPY;  Surgeon: Dutch Gray, MD;  Location: [facility];  Service: Urology;  Laterality: N/A;  no seeds seen in bladder  . esophageus cyst removal  YRS  AGO  . [place]  . PROSTATE BIOPSY  05/11/11   Adenocarcinoma ([facility] OFFICE)  . RADIOACTIVE SEED IMPLANT  11/15/2011   Procedure: RADIOACTIVE SEED IMPLANT;  Surgeon: Dutch Gray, MD;  Location: [facility];  Service: Urology;  Laterality: N/A;  Total number of seeds - 52  . REPAIR RIGHT INGUINAL HERNIA W/ MESH  09-10-1999  . TOTAL HIP ARTHROPLASTY Right 03/20/2013   Procedure: RIGHT TOTAL HIP ARTHROPLASTY ANTERIOR APPROACH;  Surgeon: [name], MD;  Location: [facility];  Service: Orthopedics;  Laterality: Right;  . TOTAL HIP REVISION Right 05/14/2013   Procedure: open reduction internal fixation REVISION RIGHT HIP ;  Surgeon: [name], MD;  Location: [facility];  Service: Orthopedics;  Laterality: Right;  . UMBILICAL HERNIA REPAIR  1998   epigastric    There were no vitals filed for this visit.  Subjective Assessment - 04/26/17 1418    Subjective  Pt fell coming out of truck Wednesday night and broke ankle. ORIF surgery 05-03-17.    Patient is accompained by:  Family member daughter    Currently in Pain?  No/denies            ADULT SLP TREATMENT - 04/26/17 1421      General Information   Behavior/Cognition  Alert;Cooperative;Requires cueing;Pleasant mood  Treatment Provided   Treatment provided  Dysphagia      Dysphagia Treatment   Temperature Spikes Noted  No    Respiratory Status  Room air    Treatment Methods  Skilled observation;Patient/caregiver education    Other treatment/comments  Pt's loud /a/ was average low 90s dB. SLP instructed daughter to work through [employer] again with pt today, and she did so spontaneously, without assistance from SLP necessary. Pt req'd min A occasionally for proper procedure. SLP reviewed all swallow precautions with pt/daughter, and again strongly suggested someone is there with pt at mealtimes to remind pt of precautions. Daughter stated this was possible. Pt's loud /a/ was measured at As pt broke ankle since  last session and is scheduled for sx on 05-03-17, SLP spoke with pt/daughter about options for ST at this time. Pt/daughter agree with SLP and think best option for ST is to wait until post-surgery to return. SLP to put pt's chart on hold until 05-25-17. If not returned by that time, SLP to d/c chart. It is suggested pt receive modified barium swallow exam the week of 06-20-17, and [phone] should be called to schedule approx 2 weeks before this date.      Assessment / Recommendations / Plan   Plan  -- put pt on hold       SLP Education - 04/26/17 1517    Education provided  Yes    Education Details  follow up modified barium swallow recommended date, recommend observer with pt's meals to remind him of precautions    Person(s) Educated  Patient;Child(ren)    Methods  Explanation    Comprehension  Verbalized understanding       SLP Short Term Goals - 04/26/17 1526      SLP SHORT TERM GOAL #1   Title  pt will complete HEP for dysarthria and dysphagia with occasional mod A over three sessions    Baseline  04-26-17    Time  2    Period  Weeks    Status  On-going      SLP SHORT TERM GOAL #2   Title  pt will follow swallow precautions with POs during session with occasional min A over two sessions    Time  2    Period  Weeks    Status  On-going      SLP SHORT TERM GOAL #3   Title  pt/family will acknowledge knowledge of 3 overt s/s aspiration PNA    Time  2    Period  Weeks    Status  On-going      SLP SHORT TERM GOAL #4   Title  pt will generate loud /a/ at average low-mid 80s over 3 sessions    Status  Achieved       SLP Long Term Goals - 04/26/17 1526      SLP LONG TERM GOAL #1   Title  pt will demo swallow and oral-motor HEP with rare min A over two sessions    Time  6    Period  Weeks    Status  On-going      SLP LONG TERM GOAL #2   Title  pt will complete loud /a/ with average low to mid 80s dB over 5 sessions    Time  6    Period  Weeks    Status  On-going       SLP LONG TERM GOAL #3   Title  pt will use average speech loudness of  low 70s dB in 5 minutes simple conversation over two sessions    Time  6    Period  Weeks    Status  On-going       Plan - 04/26/17 1517    Clinical Impression Statement  Arrived with daughter ST today. Pt continues with swallowing deficits, cognitive communication deficits. Cognitive linguistic deficits are negatively affecting pt's ability to complete HEP for swallowing and his ability to recall compensations -daughter is performing HEP with pt, and SLP again suggested pt have someone remind him of precautions during meals (dtr stated this was possible). Pt will be put on hold for 30 days due to surgery for broken ankle occurred since last ST session. See skilled intervention for more details. Pt would benefit from skilled ST to address swallowing and dysarthria for safety with POs as well as ease of communication, if possible after his surgery on 05-03-17. If pt does not return, a modified barium swallow follow up exam is recomended for week of 06-20-17.     Speech Therapy Frequency  2x / week    Treatment/Interventions  Aspiration precaution training;Pharyngeal strengthening exercises;Diet toleration management by SLP;Internal/external aids;Patient/family education;Compensatory strategies;SLP instruction and feedback;Oral motor exercises;Cueing hierarchy;Environmental controls;Cognitive reorganization    Potential to Achieve Goals  Fair    Potential Considerations  Previous level of function;Ability to learn/carryover information    Consulted and Agree with Plan of Care  Patient       Patient will benefit from skilled therapeutic intervention in order to improve the following deficits and impairments:   Dysphagia, oropharyngeal  Dysarthria and anarthria  Cognitive communication deficit    Problem List Patient Active Problem List   Diagnosis Date Noted  . Axonal neuropathy 03/21/2017  . Right sided weakness  03/01/2017  . Cigarette smoker 06/16/2016  . Dyspnea on exertion 06/15/2016  . Alzheimer disease 01/07/2014  . CSA (central sleep apnea) 10/05/2013  . Syncope 06/26/2013  . COPD GOLD II 06/25/2013  . BPH (benign prostatic hyperplasia) 05/18/2013  . Glaucoma 05/18/2013  . S/P right TH revision 05/14/2013  . Constipation 03/26/2013  . Osteoporosis 03/26/2013  . Expected blood loss anemia 03/23/2013  . S/P right THA, AA 03/20/2013  . Hyperlipidemia   . Erectile dysfunction   . History of asbestos exposure   . Nocturia   . History of shingles   . Dermatitis, atopic   . OA (osteoarthritis) of hip   . Arthritis   . Frequency of urination   . Lumbar scoliosis   . Malignant neoplasm of prostate ([place]) 06/07/2011  . 84 year old gentleman with stage T3 adenocarcinoma prostate with Gleason score 4+4 and PSA of 10.3 05/11/2011    [facility] ,MS, CCC-SLP  04/26/2017, 3:27 PM  [facility] [address] [place], [place], [address] Phone: [phone]   Fax:  [phone]   Name: XAVIER MUNGER MRN: [id number] Date of Birth: [birth year]

## 2017-04-26 NOTE — Patient Instructions (Signed)
I will put your speech therapy on hold until 05-25-17.  If you don't return by then I will discharge your ST file and send a short note to Dr. Joylene Draft informing him of what we did in therapy.  You may return after that date if you like, with a MD prescription. I recommend a modified barium swallow exam the week of 06-20-17.

## 2017-04-28 ENCOUNTER — Ambulatory Visit: Payer: Medicare HMO

## 2017-04-28 ENCOUNTER — Encounter: Payer: Medicare HMO | Admitting: Occupational Therapy

## 2017-04-28 ENCOUNTER — Ambulatory Visit: Payer: Medicare HMO | Admitting: Speech Pathology

## 2017-04-28 NOTE — H&P (Signed)
[facility] [address] [place],  [address] [phone] A Division of [facility]  RE:  Derek Carr, Derek Carr  [id number] DOB:  15-Feb-1931 04-21-2017  Reason for visit:  Left ankle pain. History of present illness:  This patient was being seen in Dr. Archie Endo clinic today and he asked me to see him.  He suffered a fall last night.  He has had pain and swelling at his left ankle.  He has a history of several TIAs including after a cataract surgery.  He has a small AAA as well.  EXAMINATION: Well-appearing male in no apparent distress.  Swelling and tenderness over his medial and lateral ankle.  Limited range of motion.  Neurovascularly intact.  IMAGING: X-rays demonstrate a displaced bimalleolar fracture.  Ankle is located.    ASSESSMENT & PLAN: Left bimalleolar fracture.  Recommend ORIF of this.  I think he will need to be done as an inpatient given his cardiovascular history.  He is going to go into a splint now and elevate as much as possible.  He has a history of confusion after narcotics.  He has done well with light doses of tramadol, so we will likely use that for pain control.  Derek Carr.  Percell Miller, M.D.   Electronically verified by Derek Carr. Percell Miller, M.D. TDM:pd D 04-20-2017 T 04-25-2017

## 2017-04-28 NOTE — Pre-Procedure Instructions (Signed)
SHERMON BOZZI  04/28/2017      [id number] - [place], [place] [address] [place] [address] Phone: [phone] Fax: [phone]    Your procedure is scheduled on Tuesday 05/03/2017.  Report to [facility] at 0800 A.M.  Call this number if you have problems the morning of surgery:  [phone]   Remember:  Do not eat food or drink liquids after midnight.   Continue all medications as directed by your physician except follow these medication instructions before surgery below   Take these medicines the morning of surgery with A SIP OF WATER: Acetaminophen (Tylenol) Mirabegron ER (Myrbetriq) Ranitidine (Zantac) Valacyclovir (Valtrex)  7 days prior to surgery STOP taking any Aleve, Naproxen, Ibuprofen, Motrin, Advil, Goody's, BC's, all herbal medications, fish oil, and all vitamins  Follow your doctors instructions regarding your Aspirin and Plavix.  If no instructions were given by your doctor, then you will need to call the prescribing office office to get instructions.       Do not wear jewelry  Do not wear lotions, powders, or colognes, or deodorant.  Men may shave face and neck.  Do not bring valuables to the hospital.  [facility] is not responsible for any belongings or valuables.  Hearing aids, eyeglasses, contacts, dentures or bridgework may not be worn into surgery.  Leave your suitcase in the car.  After surgery it may be brought to your room.  For patients admitted to the hospital, discharge time will be determined by your treatment team.  Patients discharged the day of surgery will not be allowed to drive home.   Name and phone number of your driver:    Special instructions:   [facility]- Preparing For Surgery  Before surgery, you can play an important role. Because skin is not sterile, your skin needs to be as free of germs as possible. You can reduce the  number of germs on your skin by washing with CHG (chlorahexidine gluconate) Soap before surgery.  CHG is an antiseptic cleaner which kills germs and bonds with the skin to continue killing germs even after washing.  Please do not use if you have an allergy to CHG or antibacterial soaps. If your skin becomes reddened/irritated stop using the CHG.  Do not shave (including legs and underarms) for at least 48 hours prior to first CHG shower. It is OK to shave your face.  Please follow these instructions carefully.   1. Shower the NIGHT BEFORE SURGERY and the MORNING OF SURGERY with CHG.   2. If you chose to wash your hair, wash your hair first as usual with your normal shampoo.  3. After you shampoo, rinse your hair and body thoroughly to remove the shampoo.  4. Use CHG as you would any other liquid soap. You can apply CHG directly to the skin and wash gently with a scrungie or a clean washcloth.   5. Apply the CHG Soap to your body ONLY FROM THE NECK DOWN.  Do not use on open wounds or open sores. Avoid contact with your eyes, ears, mouth and genitals (private parts). Wash Face and genitals (private parts)  with your normal soap.  6. Wash thoroughly, paying special attention to the area where your surgery will be performed.  7. Thoroughly rinse your body with warm water from the neck down.  8. DO NOT shower/wash with your normal soap after using and  rinsing off the CHG Soap.  9. Pat yourself dry with a CLEAN TOWEL.  10. Wear CLEAN PAJAMAS to bed the night before surgery, wear comfortable clothes the morning of surgery  11. Place CLEAN SHEETS on your bed the night of your first shower and DO NOT SLEEP WITH PETS.    Day of Surgery: Shower as stated above. Do not apply any deodorants/lotions. Please wear clean clothes to the hospital/surgery center.      Please read over the following fact sheets that you were given.

## 2017-04-29 ENCOUNTER — Ambulatory Visit: Payer: Self-pay | Admitting: Orthopedic Surgery

## 2017-04-29 ENCOUNTER — Inpatient Hospital Stay (HOSPITAL_COMMUNITY)
Admission: RE | Admit: 2017-04-29 | Discharge: 2017-04-29 | Disposition: A | Discharge status: Home / Self Care | Payer: Medicare HMO | Source: Ambulatory Visit

## 2017-04-29 DIAGNOSIS — S82842A Displaced bimalleolar fracture of left lower leg, initial encounter for closed fracture: Secondary | ICD-10-CM | POA: Diagnosis not present

## 2017-04-29 DIAGNOSIS — M25572 Pain in left ankle and joints of left foot: Secondary | ICD-10-CM | POA: Diagnosis not present

## 2017-05-02 ENCOUNTER — Ambulatory Visit: Payer: Medicare HMO | Admitting: Physical Therapy

## 2017-05-02 ENCOUNTER — Encounter: Payer: Medicare HMO | Admitting: Occupational Therapy

## 2017-05-02 ENCOUNTER — Encounter (HOSPITAL_COMMUNITY): Payer: Self-pay | Admitting: *Deleted

## 2017-05-02 ENCOUNTER — Other Ambulatory Visit: Payer: Self-pay

## 2017-05-02 NOTE — Progress Notes (Signed)
Anesthesia Chart Review:  Pt is a same day work up  Pt is an 84 year old male scheduled for ORIF left bimalleolar ankle fracture on 05/03/2017 with Wylene Simmer, MD  - PCP is [name], MD - Neurologist is Antony Contras, MD. He stopped plavix at last office visit 03/21/17  PMH includes: CAD, HTN, hyperlipidemia, anemia, thyroid cyst, prostate cancer, dementia.  History of specialist this 5-year.  Current smoker.  BMI 23.  S/p R THA 03/20/13 and revision R hip 05/14/13  - Hospitalized 2/5-03/03/17 for TIA   Anesthesia history includes: Confusion after anesthesia  Medications include: ASA 81 mg, Aricept, Zetia, irbesartan, pravastatin  Labs will be obtained day of surgery  CXR 12/23/16: No active cardiopulmonary disease.  EKG 03/02/17: Sinus bradycardia (59 bpm)with sinus arrhythmia. RBBB. LAFB. LVH with QRS widening and repolarization abnormality. Cannot rule out Septal infarct, age undetermined  Echo 03/02/17:  - Left ventricle: The cavity size was normal. Systolic function was normal. The estimated ejection fraction was in the range of 50% to 55%. Wall motion was normal; there were no regional wall motion abnormalities. Doppler parameters are consistent with abnormal left ventricular relaxation (grade 1 diastolic dysfunction). - Aortic valve: There was mild to moderate regurgitation.  Carotid duplex 03/02/17:  - Right Carotid: Velocities in the right ICA are consistent with a 1-39% stenosis. - Left Carotid: Velocities in the left ICA are consistent with a 1-39% stenosis. - Vertebrals: Both vertebral arteries were patent with antegrade flow. - Subclavians: Normal flow hemodynamics were seen in bilateral subclavian arteries.  AAA duplex 01/07/17: No significant abdominal aortic dilatation seen  Cardiac cath 02/24/09: 1.  Minor coronary artery irregularities (LAD with minor luminal irregularities; proximal CX 20-30%, small OM1 50-60% stenosis; RCA with minor luminal irregularities) 2. Normal  LV systolic function. Medical therapy recommended. Prn cardiology f/u recommended  If labs acceptable day of surgery, I anticipate pt can proceed with surgery as scheduled.   Willeen Cass, FNP-BC [facility]/Anesthesiology Phone: [phone] 05/02/2017 2:29 PM

## 2017-05-02 NOTE — Progress Notes (Signed)
SDW-pre-op call completed by pt with spouse, Alison Murray, on speaker phone. Pt denies SOB, chest pain, and being under the care of a cardiologist. Spouse made aware to have pt stop taking vitamins, fish oil, DHA, Ubiquinol, Probiotic, Glucosamine  and herbal medications. Do not take any NSAIDs ie: Ibuprofen, Advil, Naproxen ( Aleve), Motrin, BC and Goody Powder. Spouse verbalized understanding of all pre-op instructions. Anesthesia asked to review pt history.

## 2017-05-03 ENCOUNTER — Inpatient Hospital Stay (HOSPITAL_COMMUNITY)
Admission: RE | Admit: 2017-05-03 | Discharge: 2017-05-09 | DRG: 493 | Disposition: A | Discharge status: Home Health | Payer: Medicare HMO | Source: Ambulatory Visit | Attending: Orthopedic Surgery | Admitting: Orthopedic Surgery

## 2017-05-03 ENCOUNTER — Encounter (HOSPITAL_COMMUNITY): Admission: RE | Payer: Self-pay | Source: Ambulatory Visit

## 2017-05-03 ENCOUNTER — Ambulatory Visit: Payer: Medicare HMO | Admitting: Speech Pathology

## 2017-05-03 ENCOUNTER — Encounter (HOSPITAL_COMMUNITY)
Admission: RE | Disposition: A | Discharge status: Home Health | Payer: Self-pay | Source: Ambulatory Visit | Attending: Orthopedic Surgery

## 2017-05-03 ENCOUNTER — Encounter (HOSPITAL_COMMUNITY): Payer: Self-pay | Admitting: *Deleted

## 2017-05-03 ENCOUNTER — Inpatient Hospital Stay (HOSPITAL_COMMUNITY): Payer: Medicare HMO | Admitting: Emergency Medicine

## 2017-05-03 ENCOUNTER — Inpatient Hospital Stay (HOSPITAL_COMMUNITY): Admission: RE | Admit: 2017-05-03 | Payer: Medicare HMO | Source: Ambulatory Visit | Admitting: Orthopedic Surgery

## 2017-05-03 ENCOUNTER — Other Ambulatory Visit: Payer: Self-pay

## 2017-05-03 DIAGNOSIS — R69 Illness, unspecified: Secondary | ICD-10-CM | POA: Diagnosis not present

## 2017-05-03 DIAGNOSIS — I2510 Atherosclerotic heart disease of native coronary artery without angina pectoris: Secondary | ICD-10-CM | POA: Diagnosis present

## 2017-05-03 DIAGNOSIS — F0280 Dementia in other diseases classified elsewhere without behavioral disturbance: Secondary | ICD-10-CM | POA: Diagnosis present

## 2017-05-03 DIAGNOSIS — Z96641 Presence of right artificial hip joint: Secondary | ICD-10-CM | POA: Diagnosis present

## 2017-05-03 DIAGNOSIS — K5900 Constipation, unspecified: Secondary | ICD-10-CM | POA: Diagnosis present

## 2017-05-03 DIAGNOSIS — Z8619 Personal history of other infectious and parasitic diseases: Secondary | ICD-10-CM | POA: Diagnosis not present

## 2017-05-03 DIAGNOSIS — S82842A Displaced bimalleolar fracture of left lower leg, initial encounter for closed fracture: Secondary | ICD-10-CM | POA: Diagnosis not present

## 2017-05-03 DIAGNOSIS — Z923 Personal history of irradiation: Secondary | ICD-10-CM | POA: Diagnosis not present

## 2017-05-03 DIAGNOSIS — Z8546 Personal history of malignant neoplasm of prostate: Secondary | ICD-10-CM | POA: Diagnosis not present

## 2017-05-03 DIAGNOSIS — Z8673 Personal history of transient ischemic attack (TIA), and cerebral infarction without residual deficits: Secondary | ICD-10-CM | POA: Diagnosis not present

## 2017-05-03 DIAGNOSIS — Z9849 Cataract extraction status, unspecified eye: Secondary | ICD-10-CM

## 2017-05-03 DIAGNOSIS — F0390 Unspecified dementia without behavioral disturbance: Secondary | ICD-10-CM | POA: Diagnosis not present

## 2017-05-03 DIAGNOSIS — R0602 Shortness of breath: Secondary | ICD-10-CM | POA: Diagnosis not present

## 2017-05-03 DIAGNOSIS — S82842D Displaced bimalleolar fracture of left lower leg, subsequent encounter for closed fracture with routine healing: Secondary | ICD-10-CM | POA: Diagnosis not present

## 2017-05-03 DIAGNOSIS — Z77090 Contact with and (suspected) exposure to asbestos: Secondary | ICD-10-CM | POA: Diagnosis present

## 2017-05-03 DIAGNOSIS — R351 Nocturia: Secondary | ICD-10-CM | POA: Diagnosis not present

## 2017-05-03 DIAGNOSIS — S82852A Displaced trimalleolar fracture of left lower leg, initial encounter for closed fracture: Secondary | ICD-10-CM | POA: Diagnosis not present

## 2017-05-03 DIAGNOSIS — I10 Essential (primary) hypertension: Secondary | ICD-10-CM | POA: Diagnosis not present

## 2017-05-03 DIAGNOSIS — Z801 Family history of malignant neoplasm of trachea, bronchus and lung: Secondary | ICD-10-CM

## 2017-05-03 DIAGNOSIS — G8918 Other acute postprocedural pain: Secondary | ICD-10-CM | POA: Diagnosis not present

## 2017-05-03 DIAGNOSIS — D62 Acute posthemorrhagic anemia: Secondary | ICD-10-CM | POA: Diagnosis not present

## 2017-05-03 DIAGNOSIS — Z888 Allergy status to other drugs, medicaments and biological substances status: Secondary | ICD-10-CM | POA: Diagnosis not present

## 2017-05-03 DIAGNOSIS — Z471 Aftercare following joint replacement surgery: Secondary | ICD-10-CM | POA: Diagnosis not present

## 2017-05-03 DIAGNOSIS — Z7982 Long term (current) use of aspirin: Secondary | ICD-10-CM | POA: Diagnosis not present

## 2017-05-03 DIAGNOSIS — M810 Age-related osteoporosis without current pathological fracture: Secondary | ICD-10-CM | POA: Diagnosis present

## 2017-05-03 DIAGNOSIS — C61 Malignant neoplasm of prostate: Secondary | ICD-10-CM | POA: Diagnosis not present

## 2017-05-03 DIAGNOSIS — W010XXA Fall on same level from slipping, tripping and stumbling without subsequent striking against object, initial encounter: Secondary | ICD-10-CM | POA: Diagnosis present

## 2017-05-03 DIAGNOSIS — Z885 Allergy status to narcotic agent status: Secondary | ICD-10-CM

## 2017-05-03 DIAGNOSIS — G309 Alzheimer's disease, unspecified: Secondary | ICD-10-CM | POA: Diagnosis present

## 2017-05-03 DIAGNOSIS — M419 Scoliosis, unspecified: Secondary | ICD-10-CM | POA: Diagnosis present

## 2017-05-03 DIAGNOSIS — R531 Weakness: Secondary | ICD-10-CM | POA: Diagnosis not present

## 2017-05-03 DIAGNOSIS — H409 Unspecified glaucoma: Secondary | ICD-10-CM | POA: Diagnosis present

## 2017-05-03 DIAGNOSIS — J449 Chronic obstructive pulmonary disease, unspecified: Secondary | ICD-10-CM | POA: Diagnosis present

## 2017-05-03 DIAGNOSIS — Z8249 Family history of ischemic heart disease and other diseases of the circulatory system: Secondary | ICD-10-CM

## 2017-05-03 DIAGNOSIS — Z8042 Family history of malignant neoplasm of prostate: Secondary | ICD-10-CM

## 2017-05-03 DIAGNOSIS — E785 Hyperlipidemia, unspecified: Secondary | ICD-10-CM | POA: Diagnosis not present

## 2017-05-03 DIAGNOSIS — Z79899 Other long term (current) drug therapy: Secondary | ICD-10-CM | POA: Diagnosis not present

## 2017-05-03 DIAGNOSIS — F17210 Nicotine dependence, cigarettes, uncomplicated: Secondary | ICD-10-CM | POA: Diagnosis present

## 2017-05-03 DIAGNOSIS — G4731 Primary central sleep apnea: Secondary | ICD-10-CM | POA: Diagnosis not present

## 2017-05-03 DIAGNOSIS — G459 Transient cerebral ischemic attack, unspecified: Secondary | ICD-10-CM | POA: Diagnosis not present

## 2017-05-03 HISTORY — PX: ORIF ANKLE FRACTURE: SHX5408

## 2017-05-03 HISTORY — PX: ORIF ANKLE FRACTURE: SUR919

## 2017-05-03 HISTORY — DX: Other fracture of unspecified lower leg, initial encounter for closed fracture: S82.899A

## 2017-05-03 LAB — CBC
HEMATOCRIT: 35.4 % — AB (ref 39.0–52.0)
Hemoglobin: 11.4 g/dL — ABNORMAL LOW (ref 13.0–17.0)
MCH: 28 pg (ref 26.0–34.0)
MCHC: 32.2 g/dL (ref 30.0–36.0)
MCV: 87 fL (ref 78.0–100.0)
PLATELETS: 228 10*3/uL (ref 150–400)
RBC: 4.07 MIL/uL — ABNORMAL LOW (ref 4.22–5.81)
RDW: 15.1 % (ref 11.5–15.5)
WBC: 3.3 10*3/uL — AB (ref 4.0–10.5)

## 2017-05-03 LAB — BASIC METABOLIC PANEL
ANION GAP: 8 (ref 5–15)
BUN: 16 mg/dL (ref 6–20)
CALCIUM: 9.3 mg/dL (ref 8.9–10.3)
CO2: 24 mmol/L (ref 22–32)
Chloride: 109 mmol/L (ref 101–111)
Creatinine, Ser: 1.16 mg/dL (ref 0.61–1.24)
GFR, EST NON AFRICAN AMERICAN: 56 mL/min — AB (ref >60)
GLUCOSE: 83 mg/dL (ref 65–99)
POTASSIUM: 4.2 mmol/L (ref 3.5–5.1)
Sodium: 141 mmol/L (ref 135–145)

## 2017-05-03 LAB — SURGICAL PCR SCREEN
MRSA, PCR: NEGATIVE
STAPHYLOCOCCUS AUREUS: NEGATIVE

## 2017-05-03 SURGERY — OPEN REDUCTION INTERNAL FIXATION (ORIF) ANKLE FRACTURE
Anesthesia: Choice | Laterality: Left

## 2017-05-03 SURGERY — OPEN REDUCTION INTERNAL FIXATION (ORIF) ANKLE FRACTURE
Anesthesia: Spinal | Site: Ankle | Laterality: Left

## 2017-05-03 MED ORDER — ONDANSETRON HCL 4 MG PO TABS: 4.0000 mg | ORAL_TABLET | Freq: Four times a day (QID) | ORAL | Status: DC | PRN

## 2017-05-03 MED ORDER — MIDAZOLAM HCL 2 MG/2ML IJ SOLN
INTRAMUSCULAR | Status: AC
  Filled 2017-05-03: qty 2

## 2017-05-03 MED ORDER — SODIUM CHLORIDE 0.9 % IV SOLN: INTRAVENOUS | Status: DC

## 2017-05-03 MED ORDER — CHLORHEXIDINE GLUCONATE 4 % EX LIQD: 60.0000 mL | Freq: Once | CUTANEOUS | Status: DC

## 2017-05-03 MED ORDER — IRBESARTAN 75 MG PO TABS
37.5000 mg | ORAL_TABLET | Freq: Every day | ORAL | Status: DC
  Administered 2017-05-03 – 2017-05-09 (×7): 37.5 mg via ORAL
  Filled 2017-05-03 (×7): qty 0.5

## 2017-05-03 MED ORDER — POVIDONE-IODINE 10 % EX SWAB
2.0000 "application " | Freq: Once | CUTANEOUS | Status: AC
  Administered 2017-05-03: 2 via TOPICAL

## 2017-05-03 MED ORDER — CELECOXIB 200 MG PO CAPS
200.0000 mg | ORAL_CAPSULE | Freq: Two times a day (BID) | ORAL | Status: DC
  Administered 2017-05-03 – 2017-05-09 (×12): 200 mg via ORAL
  Filled 2017-05-03 (×12): qty 1

## 2017-05-03 MED ORDER — MIRABEGRON ER 25 MG PO TB24: 50.0000 mg | ORAL_TABLET | Freq: Every day | ORAL | Status: DC

## 2017-05-03 MED ORDER — FENTANYL CITRATE (PF) 100 MCG/2ML IJ SOLN
50.0000 ug | Freq: Once | INTRAMUSCULAR | Status: AC
  Administered 2017-05-03: 50 ug via INTRAVENOUS

## 2017-05-03 MED ORDER — ENOXAPARIN SODIUM 40 MG/0.4ML ~~LOC~~ SOLN
40.0000 mg | SUBCUTANEOUS | Status: DC
  Administered 2017-05-04 – 2017-05-09 (×6): 40 mg via SUBCUTANEOUS
  Filled 2017-05-03 (×6): qty 0.4

## 2017-05-03 MED ORDER — CEFAZOLIN SODIUM-DEXTROSE 2-4 GM/100ML-% IV SOLN
2.0000 g | INTRAVENOUS | Status: DC
  Filled 2017-05-03: qty 100

## 2017-05-03 MED ORDER — EZETIMIBE 10 MG PO TABS
10.0000 mg | ORAL_TABLET | Freq: Every morning | ORAL | Status: DC
  Administered 2017-05-04 – 2017-05-09 (×6): 10 mg via ORAL
  Filled 2017-05-03 (×6): qty 1

## 2017-05-03 MED ORDER — SODIUM CHLORIDE 0.9 % IV SOLN
INTRAVENOUS | Status: DC
  Administered 2017-05-03: 19:00:00 via INTRAVENOUS

## 2017-05-03 MED ORDER — TRAMADOL HCL 50 MG PO TABS
50.0000 mg | ORAL_TABLET | Freq: Four times a day (QID) | ORAL | Status: DC | PRN
  Administered 2017-05-03 – 2017-05-05 (×3): 50 mg via ORAL
  Filled 2017-05-03 (×4): qty 1

## 2017-05-03 MED ORDER — VITAMIN D 1000 UNITS PO TABS
1000.0000 [IU] | ORAL_TABLET | Freq: Every day | ORAL | Status: DC
  Administered 2017-05-03 – 2017-05-09 (×7): 1000 [IU] via ORAL
  Filled 2017-05-03 (×9): qty 1

## 2017-05-03 MED ORDER — FENTANYL CITRATE (PF) 100 MCG/2ML IJ SOLN: 25.0000 ug | INTRAMUSCULAR | Status: DC | PRN

## 2017-05-03 MED ORDER — FENTANYL CITRATE (PF) 250 MCG/5ML IJ SOLN
INTRAMUSCULAR | Status: DC | PRN
  Administered 2017-05-03: 50 ug via INTRAVENOUS

## 2017-05-03 MED ORDER — 0.9 % SODIUM CHLORIDE (POUR BTL) OPTIME
TOPICAL | Status: DC | PRN
  Administered 2017-05-03: 1000 mL

## 2017-05-03 MED ORDER — SENNA 8.6 MG PO TABS
1.0000 | ORAL_TABLET | Freq: Two times a day (BID) | ORAL | Status: DC
  Administered 2017-05-04 – 2017-05-09 (×10): 8.6 mg via ORAL
  Filled 2017-05-03 (×12): qty 1

## 2017-05-03 MED ORDER — PRAVASTATIN SODIUM 40 MG PO TABS
80.0000 mg | ORAL_TABLET | Freq: Every evening | ORAL | Status: DC
  Administered 2017-05-03 – 2017-05-08 (×6): 80 mg via ORAL
  Filled 2017-05-03 (×6): qty 2

## 2017-05-03 MED ORDER — LACTATED RINGERS IV SOLN
INTRAVENOUS | Status: DC | PRN
  Administered 2017-05-03 (×2): via INTRAVENOUS

## 2017-05-03 MED ORDER — FENTANYL CITRATE (PF) 100 MCG/2ML IJ SOLN
INTRAMUSCULAR | Status: AC
  Administered 2017-05-03: 50 ug via INTRAVENOUS
  Filled 2017-05-03: qty 2

## 2017-05-03 MED ORDER — BUPIVACAINE HCL (PF) 0.75 % IJ SOLN
INTRAMUSCULAR | Status: DC | PRN
  Administered 2017-05-03 (×2): 1.5 mL via INTRATHECAL

## 2017-05-03 MED ORDER — PROPOFOL 500 MG/50ML IV EMUL
INTRAVENOUS | Status: DC | PRN
  Administered 2017-05-03: 15 ug/kg/min via INTRAVENOUS

## 2017-05-03 MED ORDER — VALACYCLOVIR HCL 500 MG PO TABS
1000.0000 mg | ORAL_TABLET | Freq: Every day | ORAL | Status: DC
  Administered 2017-05-03 – 2017-05-09 (×8): 1000 mg via ORAL
  Filled 2017-05-03 (×7): qty 2

## 2017-05-03 MED ORDER — DONEPEZIL HCL 10 MG PO TABS
5.0000 mg | ORAL_TABLET | Freq: Every day | ORAL | Status: DC
  Administered 2017-05-03 – 2017-05-08 (×6): 5 mg via ORAL
  Filled 2017-05-03 (×6): qty 1

## 2017-05-03 MED ORDER — ACETAMINOPHEN 500 MG PO TABS
500.0000 mg | ORAL_TABLET | Freq: Four times a day (QID) | ORAL | Status: AC
  Administered 2017-05-03 – 2017-05-04 (×4): 500 mg via ORAL
  Filled 2017-05-03 (×4): qty 1

## 2017-05-03 MED ORDER — PROPOFOL 10 MG/ML IV BOLUS
INTRAVENOUS | Status: DC | PRN
  Administered 2017-05-03 (×2): 10 mg via INTRAVENOUS

## 2017-05-03 MED ORDER — ONDANSETRON HCL 4 MG/2ML IJ SOLN
INTRAMUSCULAR | Status: DC | PRN
  Administered 2017-05-03: 4 mg via INTRAVENOUS

## 2017-05-03 MED ORDER — FOLIC ACID 1 MG PO TABS
1.0000 mg | ORAL_TABLET | Freq: Every day | ORAL | Status: DC
  Administered 2017-05-04 – 2017-05-09 (×6): 1 mg via ORAL
  Filled 2017-05-03 (×6): qty 1

## 2017-05-03 MED ORDER — BUPIVACAINE-EPINEPHRINE (PF) 0.5% -1:200000 IJ SOLN
INTRAMUSCULAR | Status: DC | PRN
  Administered 2017-05-03: 25 mL via PERINEURAL

## 2017-05-03 MED ORDER — VITAMIN B-12 1000 MCG PO TABS
1000.0000 ug | ORAL_TABLET | Freq: Every day | ORAL | Status: DC
  Administered 2017-05-04 – 2017-05-09 (×6): 1000 ug via ORAL
  Filled 2017-05-03 (×6): qty 1

## 2017-05-03 MED ORDER — ONDANSETRON HCL 4 MG/2ML IJ SOLN: 4.0000 mg | Freq: Four times a day (QID) | INTRAMUSCULAR | Status: DC | PRN

## 2017-05-03 MED ORDER — DIPHENHYDRAMINE HCL 12.5 MG/5ML PO ELIX: 12.5000 mg | ORAL_SOLUTION | ORAL | Status: DC | PRN

## 2017-05-03 MED ORDER — LACTATED RINGERS IV SOLN
INTRAVENOUS | Status: DC
  Administered 2017-05-03: 11:00:00 via INTRAVENOUS

## 2017-05-03 MED ORDER — DOCUSATE SODIUM 100 MG PO CAPS
100.0000 mg | ORAL_CAPSULE | Freq: Two times a day (BID) | ORAL | Status: DC
  Administered 2017-05-04 – 2017-05-09 (×10): 100 mg via ORAL
  Filled 2017-05-03 (×12): qty 1

## 2017-05-03 SURGICAL SUPPLY — 58 items
BANDAGE ESMARK 6X9 LF (GAUZE/BANDAGES/DRESSINGS) ×1 IMPLANT
BIT DRILL 2.5X2.75 QC CALB (BIT) ×2 IMPLANT
BIT DRILL 3.5X5.5 QC CALB (BIT) ×2 IMPLANT
BLADE SURG 15 STRL LF DISP TIS (BLADE) ×1 IMPLANT
BLADE SURG 15 STRL SS (BLADE) ×1
BNDG COHESIVE 4X5 TAN STRL (GAUZE/BANDAGES/DRESSINGS) ×2 IMPLANT
BNDG COHESIVE 6X5 TAN STRL LF (GAUZE/BANDAGES/DRESSINGS) IMPLANT
BNDG ESMARK 6X9 LF (GAUZE/BANDAGES/DRESSINGS) ×2
CANISTER SUCT 3000ML PPV (MISCELLANEOUS) ×2 IMPLANT
CHLORAPREP W/TINT 26ML (MISCELLANEOUS) ×2 IMPLANT
COVER SURGICAL LIGHT HANDLE (MISCELLANEOUS) ×2 IMPLANT
CUFF TOURNIQUET SINGLE 34IN LL (TOURNIQUET CUFF) ×2 IMPLANT
DRAPE OEC MINIVIEW 54X84 (DRAPES) ×2 IMPLANT
DRAPE U-SHAPE 47X51 STRL (DRAPES) ×2 IMPLANT
DRSG MEPITEL 4X7.2 (GAUZE/BANDAGES/DRESSINGS) ×2 IMPLANT
ELECT CAUTERY BLADE 6.4 (BLADE) ×2 IMPLANT
ELECT REM PT RETURN 9FT ADLT (ELECTROSURGICAL) ×2
ELECTRODE REM PT RTRN 9FT ADLT (ELECTROSURGICAL) ×1 IMPLANT
GAUZE SPONGE 4X4 12PLY STRL (GAUZE/BANDAGES/DRESSINGS) ×2 IMPLANT
GLOVE BIO SURGEON STRL SZ8 (GLOVE) ×2 IMPLANT
GLOVE BIOGEL PI IND STRL 8 (GLOVE) ×1 IMPLANT
GLOVE BIOGEL PI INDICATOR 8 (GLOVE) ×1
GLOVE ECLIPSE 8.0 STRL XLNG CF (GLOVE) ×2 IMPLANT
GOWN STRL REUS W/ TWL LRG LVL3 (GOWN DISPOSABLE) ×1 IMPLANT
GOWN STRL REUS W/ TWL XL LVL3 (GOWN DISPOSABLE) ×3 IMPLANT
GOWN STRL REUS W/TWL LRG LVL3 (GOWN DISPOSABLE) ×1
GOWN STRL REUS W/TWL XL LVL3 (GOWN DISPOSABLE) ×3
K-WIRE ACE 1.6X6 (WIRE) ×4
KIT BASIN OR (CUSTOM PROCEDURE TRAY) ×2 IMPLANT
KIT TURNOVER KIT B (KITS) ×2 IMPLANT
KWIRE ACE 1.6X6 (WIRE) ×2 IMPLANT
NS IRRIG 1000ML POUR BTL (IV SOLUTION) ×2 IMPLANT
PACK ORTHO EXTREMITY (CUSTOM PROCEDURE TRAY) ×2 IMPLANT
PAD ABD 8X10 STRL (GAUZE/BANDAGES/DRESSINGS) ×2 IMPLANT
PAD ARMBOARD 7.5X6 YLW CONV (MISCELLANEOUS) ×4 IMPLANT
PAD CAST 4YDX4 CTTN HI CHSV (CAST SUPPLIES) ×1 IMPLANT
PADDING CAST COTTON 4X4 STRL (CAST SUPPLIES) ×1
PADDING CAST COTTON 6X4 STRL (CAST SUPPLIES) ×2 IMPLANT
PLATE ACE 100DEG 7HOLE (Plate) ×2 IMPLANT
SCREW ACE CAN 4.0 40M (Screw) ×4 IMPLANT
SCREW CORTICAL 3.5MM  16MM (Screw) ×5 IMPLANT
SCREW CORTICAL 3.5MM  20MM (Screw) ×1 IMPLANT
SCREW CORTICAL 3.5MM  30MM (Screw) ×1 IMPLANT
SCREW CORTICAL 3.5MM 16MM (Screw) ×5 IMPLANT
SCREW CORTICAL 3.5MM 18MM (Screw) ×2 IMPLANT
SCREW CORTICAL 3.5MM 20MM (Screw) ×1 IMPLANT
SCREW CORTICAL 3.5MM 22MM (Screw) ×2 IMPLANT
SCREW CORTICAL 3.5MM 26MM (Screw) ×2 IMPLANT
SCREW CORTICAL 3.5MM 30MM (Screw) ×1 IMPLANT
SPONGE LAP 18X18 X RAY DECT (DISPOSABLE) ×2 IMPLANT
SUCTION FRAZIER HANDLE 10FR (MISCELLANEOUS) ×1
SUCTION TUBE FRAZIER 10FR DISP (MISCELLANEOUS) ×1 IMPLANT
SUT ETHILON 3 0 PS 1 (SUTURE) ×4 IMPLANT
SUT MNCRL AB 3-0 PS2 18 (SUTURE) ×2 IMPLANT
SUT VIC AB 2-0 CT1 27 (SUTURE) ×2
SUT VIC AB 2-0 CT1 TAPERPNT 27 (SUTURE) ×2 IMPLANT
TOWEL GREEN STERILE (TOWEL DISPOSABLE) ×2 IMPLANT
TUBE CONNECTING 12X1/4 (SUCTIONS) ×2 IMPLANT

## 2017-05-03 NOTE — Anesthesia Postprocedure Evaluation (Signed)
Anesthesia Post Note  Patient: Derek Carr  Procedure(s) Performed: OPEN REDUCTION INTERNAL FIXATION (ORIF) BIMALLEOLAR  ANKLE FRACTURE (Left Ankle)     Patient location during evaluation: PACU Anesthesia Type: Spinal Level of consciousness: oriented and awake and alert Pain management: pain level controlled Vital Signs Assessment: post-procedure vital signs reviewed and stable Respiratory status: spontaneous breathing, respiratory function stable and patient connected to nasal cannula oxygen Cardiovascular status: blood pressure returned to baseline and stable Postop Assessment: no headache, no backache and no apparent nausea or vomiting Anesthetic complications: no    Last Vitals:  Vitals:   05/03/17 1230 05/03/17 1429  BP: (!) 189/92 (!) 146/83  Pulse: (!) 55 96  Resp: 14 (!) 23  Temp:  (!) 36.4 C  SpO2: 100% 98%    Last Pain:  Vitals:   05/03/17 1500  TempSrc:   PainSc: 0-No pain                 Onur Mori,JAMES TERRILL

## 2017-05-03 NOTE — H&P (Signed)
Derek Carr is an 84 y.o. male.   Chief Complaint:  Left ankle injury HPI: The patient is an 84 year old male who sustained a left ankle fracture approximately 2 weeks ago.  He initially was reasonably well reduced but his ankle has progressed to significant displacement.  He presents now for operative treatment of this displaced and unstable ankle fracture.  Past Medical History:  Diagnosis Date  . Anemia   . Ankle fracture    left  . Atherosclerosis   . Complication of anesthesia    CONFUSION - Pt's family very concerned about this  . DDD (degenerative disc disease)   . Degenerative arthritis   . Dermatitis, atopic ARMS AND LEGS  . Diverticulosis   . Erectile dysfunction   . Frequency of urination   . Glaucoma   . History of asbestos exposure   . History of radiation therapy 8/19-13-10/18/11   prostate, 28 GY  . History of shingles 2012-- BILATERAL EYES--  NO RESIDUAL  . Hyperlipidemia   . Hypertension   . Inguinal hernia   . Lumbar scoliosis   . Nocturia   . OA (osteoarthritis) of hip RIGHT  . Prostate cancer ([place]) 05/11/2011   bx=Adenocarcinoma,gleason3+4=7, 4+4=8,PSA=10.30volume=45.7cc  . Renal cyst    bilateral  . Smokers' cough ([place])   . Thyroid cyst     Past Surgical History:  Procedure Laterality Date  . ATTEMPTED LEFT VATS/ LEFT THORACOTOMY/ RESECTION OF THE ENORMOUS, PROBABLE BRONCHOGENIC CYST  01-04-2005  DR Arlyce Dice  . CARDIAC CATHETERIZATION  02-24-2009  DR NASHER   MINOR CORONARY ARTERY IRREGULARITIES/ NORMAL LVSF/ EF 65-70%  . CATARACT EXTRACTION    . COLONOSCOPY W/ POLYPECTOMY    . CYSTOSCOPY  11/15/2011   Procedure: CYSTOSCOPY;  Surgeon: Dutch Gray, MD;  Location: [facility];  Service: Urology;  Laterality: N/A;  no seeds seen in bladder  . esophageus cyst removal  YRS AGO  . [place]  . PROSTATE BIOPSY  05/11/11   Adenocarcinoma ([facility] OFFICE)  . RADIOACTIVE SEED IMPLANT  11/15/2011   Procedure: RADIOACTIVE SEED  IMPLANT;  Surgeon: Dutch Gray, MD;  Location: [facility];  Service: Urology;  Laterality: N/A;  Total number of seeds - 52  . REPAIR RIGHT INGUINAL HERNIA W/ MESH  09-10-1999  . TOTAL HIP ARTHROPLASTY Right 03/20/2013   Procedure: RIGHT TOTAL HIP ARTHROPLASTY ANTERIOR APPROACH;  Surgeon: [name], MD;  Location: [facility];  Service: Orthopedics;  Laterality: Right;  . TOTAL HIP REVISION Right 05/14/2013   Procedure: open reduction internal fixation REVISION RIGHT HIP ;  Surgeon: [name], MD;  Location: [facility];  Service: Orthopedics;  Laterality: Right;  . UMBILICAL HERNIA REPAIR  1998   epigastric    Family History  Problem Relation Age of Onset  . Hypertension Mother   . Prostate cancer Brother        surgery/cured  . Lung cancer Brother        new primary/same brother   Social History:  reports that he has been smoking cigarettes.  He has a 31.00 pack-year smoking history. He has never used smokeless tobacco. He reports that he does not drink alcohol or use drugs.  Allergies:  Allergies  Allergen Reactions  . Dilaudid [Hydromorphone Hcl] Other (See Comments)    Confusion   . Methocarbamol Other (See Comments)    Drowsiness   . Percodan [Oxycodone-Aspirin] Other (See Comments)    Confusion   . Tramadol Other (See Comments)  Drowsiness   . Vicodin [Hydrocodone-Acetaminophen] Other (See Comments)    Confusion     Medications Prior to Admission  Medication Sig Dispense Refill  . acetaminophen (TYLENOL) 500 MG tablet Take 1,000 mg by mouth 2 (two) times daily as needed (for pain).     Marland Kitchen aspirin 81 MG EC tablet Take 81 mg by mouth daily.    . Calcium Carbonate Antacid 1177 MG CHEW Chew 1 tablet by mouth daily.     . cetirizine (ZYRTEC) 10 MG tablet Take 10 mg by mouth daily.    . Cholecalciferol (VITAMIN D-3) 1000 units CAPS Take 1 capsule by mouth daily.     . Cyanocobalamin (VITAMIN B12) 1000 MCG TBCR Take 1 tablet by mouth daily.     .  Docosahexaenoic Acid (DHA OMEGA 3 PO) Take 1,000 mg by mouth daily.     Marland Kitchen ezetimibe (ZETIA) 10 MG tablet Take 10 mg by mouth every morning.     . folic acid (FOLVITE) 1 MG tablet Take 1 mg by mouth daily.    . Glucos-Chond-Hyal Ac-Ca Fructo (MOVE FREE JOINT HEALTH ADVANCE PO) Take 1 tablet by mouth daily.    . irbesartan (AVAPRO) 75 MG tablet Take 37.5 mg by mouth daily. **REPLACES VALSARTAN**  11  . pravastatin (PRAVACHOL) 80 MG tablet Take 80 mg by mouth every evening.     . Probiotic Product (PROBIOTIC PO) Take 1 capsule by mouth daily.     Marland Kitchen Ubiquinol 100 MG CAPS Take 100 mg by mouth daily.    . valACYclovir (VALTREX) 1000 MG tablet Take 1,000 mg by mouth daily.     Marland Kitchen donepezil (ARICEPT) 10 MG tablet Take 1 tablet (10 mg total) by mouth at bedtime. Start 1/2 tablet at bedtime x 4 weeks then 1 tablet at night 30 tablet 3  . mirabegron ER (MYRBETRIQ) 50 MG TB24 tablet Take 50 mg by mouth daily.       Results for orders placed or performed during the hospital encounter of 05/03/17 (from the past 48 hour(s))  CBC     Status: Abnormal   Collection Time: 05/03/17 10:39 AM  Result Value Ref Range   WBC 3.3 (L) 4.0 - 10.5 K/uL   RBC 4.07 (L) 4.22 - 5.81 MIL/uL   Hemoglobin 11.4 (L) 13.0 - 17.0 g/dL   HCT 35.4 (L) 39.0 - 52.0 %   MCV 87.0 78.0 - 100.0 fL   MCH 28.0 26.0 - 34.0 pg   MCHC 32.2 30.0 - 36.0 g/dL   RDW 15.1 11.5 - 15.5 %   Platelets 228 150 - 400 K/uL    Comment: Performed at [facility], [place] [address], [place], [place] [address]  Basic metabolic panel     Status: Abnormal   Collection Time: 05/03/17 10:39 AM  Result Value Ref Range   Sodium 141 135 - 145 mmol/L   Potassium 4.2 3.5 - 5.1 mmol/L   Chloride 109 101 - 111 mmol/L   CO2 24 22 - 32 mmol/L   Glucose, Bld 83 65 - 99 mg/dL   BUN 16 6 - 20 mg/dL   Creatinine, Ser 1.16 0.61 - 1.24 mg/dL   Calcium 9.3 8.9 - 10.3 mg/dL   GFR calc non Af Amer 56 (L) >60 mL/min   GFR calc Af Amer >60 >60 mL/min    Comment:  (NOTE) The eGFR has been calculated using the CKD EPI equation. This calculation has not been validated in all clinical situations. eGFR's persistently <60  mL/min signify possible Chronic Kidney Disease.    Anion gap 8 5 - 15    Comment: Performed at [facility] [address] [place], [place] [address]   No results found.  ROS no recent fever, chills, nausea, vomiting or changes in his appetite  Blood pressure (!) 189/92, pulse (!) 55, temperature 97.7 F (36.5 C), temperature source Oral, resp. rate 14, height _0  (1.905 m), weight 82.6 kg (182 lb), SpO2 100 %. Physical Exam  Well-nourished well-developed elderly man in no apparent distress.  Alert and oriented x4.  Mood and affect are normal.  Extraocular motions are intact.  Respirations are unlabored.  Gait is nonweightbearing on the left.  Left ankle has mild swelling.  Skin is healthy and intact.  Pulses are palpable.  No lymphadenopathy.  5 out of 5 strength in plantar flexion and dorsiflexion of the toes.  Sensibility to light touch is intact dorsally and plantarly at the forefoot.  Assessment/Plan Left ankle displaced bimalleolar fracture -to the operating room today for open treatment with internal fixation of this unstable and displaced bimalleolar fracture.  The risks and benefits of the alternative treatment options have been discussed in detail.  The patient and his family wish to proceed with surgery and specifically understand risks of bleeding, infection, nerve damage, blood clots, need for additional surgery, amputation and death.   Wylene Simmer, MD May 04, 2017, 12:51 PM

## 2017-05-03 NOTE — Progress Notes (Signed)
Late entry Spoke with pt daughter, [name] [facility]) regarding pre-op instructions. Daughter clarified that pt was not given pre-op instructions regarding Aspirin. Daughter agreed to bring Aspirin in on DOS and have pt take it then if okay.

## 2017-05-03 NOTE — Anesthesia Preprocedure Evaluation (Addendum)
Anesthesia Evaluation  Patient identified by MRN, date of birth, ID band Patient awake    Reviewed: Allergy & Precautions, Patient's Chart, lab work & pertinent test results  History of Anesthesia Complications (+) Emergence Delirium  Airway Mallampati: I  TM Distance: >3 FB Neck ROM: Full    Dental no notable dental hx. (+) Teeth Intact   Pulmonary COPD, Current Smoker,    breath sounds clear to auscultation       Cardiovascular hypertension, + CAD   Rhythm:Regular Rate:Normal  Derek Carr  ECHO COMPLETE WO IMAGING ENHANCING AGENT  Order# [id number]  Reading physician: Thayer Headings, MD Ordering physician: Damita Lack, MD Study date: 03/02/17 Study Result   Result status: Final result                             *[facility]*                         [address], [place] [address]                            [phone]  ------------------------------------------------------------------- Transthoracic Echocardiography  Patient:    Derek Carr, Derek Carr MR #:       [id number] Study Date: 03/02/2017 Gender:     M Age:        84 Height:     190.5 cm Weight:     81.6 kg BSA:        2.07 m^2 Pt. Status: Room:   ATTENDING    Vann, [place], Inpatient  SONOGRAPHER  [facility]  ORDERING     Amin, Ankit Chirag  REFERRING    Amin, Ankit Chirag  cc:  ------------------------------------------------------------------- LV EF: 50% -   55%  ------------------------------------------------------------------- Indications:      CVA 436.  ------------------------------------------------------------------- History:   PMH:   Syncope.  Chronic obstructive pulmonary disease. Risk factors:  Dyslipidemia.  ------------------------------------------------------------------- Study Conclusions  - Left  ventricle: The cavity size was normal. Systolic function was   normal. The estimated ejection fraction was in the range of 50%   to 55%. Wall motion was normal; there were no regional wall   motion abnormalities. Doppler parameters are consistent with   abnormal left ventricular relaxation (grade 1 diastolic   dysfunction). - Aortic valve: There was mild to moderate regurgitation.  ------------------------------------------------------------------- Study data:  No prior study was available for comparison.  Study status:  Routine.  Procedure:  The patient reported no pain pre or post test. Transthoracic echocardiography. Image quality was adequate.  Study completion:  There were no complications. Transthoracic echocardiography.  M-mode, complete 2D, spectral Doppler, and color Doppler.  Birthdate:  Patient birthdate: [birth year].  Age:  Patient is 84 yr old.  Sex:  Gender: male. BMI: 22.5 kg/m^2.  Blood pressure:     178/81  Patient status: Inpatient.  Study date:  Study date: 03/02/2017. Study time: 08:17 AM.  Location:  Echo laboratory.  -------------------------------------------------------------------  ------------------------------------------------------------------- Left ventricle:  The cavity size was normal. Systolic function was normal. The estimated ejection fraction was  in the range of 50% to 55%. Wall motion was normal; there were no regional wall motion abnormalities. Doppler parameters are consistent with abnormal left ventricular relaxation (grade 1 diastolic dysfunction).  ------------------------------------------------------------------- Aortic valve:   Structurally normal valve.   Cusp separation was normal.  Doppler:  Transvalvular velocity was within the normal range. There was no stenosis. There was mild to moderate regurgitation.  ------------------------------------------------------------------- Aorta:  Aortic root: The aortic root was normal in  size. Ascending aorta: The ascending aorta was normal in size.  ------------------------------------------------------------------- Mitral valve:   Structurally normal valve.   Leaflet separation was normal.  Doppler:  Transvalvular velocity was within the normal range. There was no evidence for stenosis. There was no regurgitation.  ------------------------------------------------------------------- Left atrium:  The atrium was normal in size.  ------------------------------------------------------------------- Right ventricle:  The cavity size was normal. Systolic function was normal.  ------------------------------------------------------------------- Pulmonic valve:    The valve appears to be grossly normal. Doppler:  There was no significant regurgitation.  ------------------------------------------------------------------- Tricuspid valve:   The valve appears to be grossly normal. Doppler:  There was trivial regurgitation.  ------------------------------------------------------------------- Right atrium:  The atrium was normal in size.  ------------------------------------------------------------------- Pericardium:  There was no pericardial effusion.  ------------------------------------------------------------------- Systemic veins: Inferior vena cava: The vessel was normal in size. The respirophasic diameter changes were in the normal range (>= 50%), consistent with normal central venous pressure.  ------------------------------------------------------------------- Measurements   Left ventricle                           Value        Reference  LV ID, ED, PLAX chordal          (L)     41.2  mm     43 - 52  LV ID, ES, PLAX chordal                  33.2  mm     23 - 38  LV fx shortening, PLAX chordal   (L)     19    %      >=29  LV PW thickness, ED                      11.3  mm     ----------  IVS/LV PW ratio, ED                      1.01         <=1.3   Stroke volume, 2D                        64    ml     ----------  Stroke volume/bsa, 2D                    31    ml/m^2 ----------  LV e&', lateral                           6.96  cm/s   ----------  LV E/e&', lateral                         6.28         ----------  LV e&', medial  3.48  cm/s   ----------  LV E/e&', medial                          12.56        ----------  LV e&', average                           5.22  cm/s   ----------  LV E/e&', average                         8.37         ----------    Ventricular septum                       Value        Reference  IVS thickness, ED                        11.4  mm     ----------    LVOT                                     Value        Reference  LVOT ID, S                               20    mm     ----------  LVOT area                                3.14  cm^2   ----------  LVOT peak velocity, S                    103   cm/s   ----------  LVOT mean velocity, S                    68.2  cm/s   ----------  LVOT VTI, S                              20.5  cm     ----------    Aortic valve                             Value        Reference  Aortic regurg pressure half-time         782   ms     ----------    Aorta                                    Value        Reference  Aortic root ID, ED                       32    mm     ----------    Left atrium  Value        Reference  LA ID, A-P, ES                           30    mm     ----------  LA ID/bsa, A-P                           1.45  cm/m^2 <=2.2  LA volume, S                             66    ml     ----------  LA volume/bsa, S                         31.8  ml/m^2 ----------  LA volume, ES, 1-p A4C                   65.1  ml     ----------  LA volume/bsa, ES, 1-p A4C               31.4  ml/m^2 ----------  LA volume, ES, 1-p A2C                   62.8  ml     ----------  LA volume/bsa, ES, 1-p A2C               30.3  ml/m^2  ----------    Mitral valve                             Value        Reference  Mitral E-wave peak velocity              43.7  cm/s   ----------  Mitral A-wave peak velocity              89.2  cm/s   ----------  Mitral deceleration time         (H)     419   ms     150 - 230  Mitral E/A ratio, peak                   0.5          ----------    Right atrium                             Value        Reference  RA ID, S-I, ES, A4C              (H)     49.6  mm     34 - 49  RA area, ES, A4C                         15.6  cm^2   8.3 - 19.5  RA volume, ES, A/L                       40.4  ml     ----------  RA volume/bsa, ES, A/L                   19.5  ml/m^2 ----------  Systemic veins                           Value        Reference  Estimated CVP                            3     mm Hg  ----------    Right ventricle                          Value        Reference  TAPSE                                    16.9  mm     ----------  RV s&', lateral, S                        9.46  cm/s   ----------  Legend: (L)  and  (H)  mark values outside specified reference range.  ------------------------------------------------------------------- Prepared and Electronically Authenticated by  Mertie Moores, M.D. 2019-02-06T13:02:13 [facility] Images   Show images for ECHOCARDIOGRAM COMPLETE Patient Information   Patient Name Derek Carr, Derek Carr Sex Male DOB [birth year] SSN [id number] Reason for Exam  Priority: Routine  Not on file Surgical History   Surgical History    Procedure Laterality Date Comment Source CARDIAC CATHETERIZATION  02-24-2009 DR NASHER MINOR CORONARY ARTERY IRREGULARITIES/ NORMAL LVSF/ EF 65-70% Provider  Other Surgical History    Procedure Laterality Date Comment Source ATTEMPTED LEFT VATS/ LEFT THORACOTOMY/ RESECTION OF THE ENORMOUS, PROBABLE BRONCHOGENIC CYST  01-04-2005 DR Arlyce Dice  Provider CATARACT EXTRACTION    Provider COLONOSCOPY W/  POLYPECTOMY    Provider CYSTOSCOPY  11/15/2011 Procedure: CYSTOSCOPY; Surgeon: Dutch Gray, MD; Location: [facility]; Service: Urology; Laterality: N/A; no seeds seen in bladder Provider esophageus cyst removal  YRS AGO  Provider [place]  Provider PROSTATE BIOPSY  05/11/11 Adenocarcinoma ([facility] OFFICE) Provider RADIOACTIVE SEED IMPLANT  11/15/2011 Procedure: RADIOACTIVE SEED IMPLANT; Surgeon: Dutch Gray, MD; Location: [facility]; Service: Urology; Laterality: N/A; Total number of seeds - 52 Provider REPAIR RIGHT INGUINAL HERNIA W/ [place]  09-10-1999  Provider TOTAL HIP ARTHROPLASTY Right 03/20/2013 Procedure: RIGHT TOTAL HIP ARTHROPLASTY ANTERIOR APPROACH; Surgeon: [name], MD; Location: [facility]; Service: Orthopedics; Laterality: Right; Provider TOTAL HIP REVISION Right 05/14/2013 Procedure: open reduction internal fixation REVISION RIGHT HIP ; Surgeon: [name], MD; Location: [facility]; Service: Orthopedics; Laterality: Right; Provider UMBILICAL HERNIA REPAIR  1998 epigastric Provider  Patient Data   BP  178/81 mmHg    Performing Technologist/Nurse   Performing Technologist/Nurse: Tomasa Hose          Implants    Implant Radioactive Seeds - Implanted  Prostate    Model/Cat number: [id number] Serial number: [id number] Lot number: [id number]   As of 11/15/2011     Status: Implanted    Liner Neutral 58x40mm Plus4 - [id number] - Implanted  (Right) Hip    Inventory item: LINER NEUTRAL 58X36MM PLUS4 Model/Cat number: [id number] Manufacturer: Rich Reining Lot number: [id number] As of 03/20/2013     Status: Implanted    Capt Hip Pf Mop - [id number] - Capitated Charge Entry  (Right)    Inventory item: CAPT HIP PF  MOP Model/Cat number: [id number] Manufacturer: Rich Reining   As of 03/20/2013     Status: Capitated Charge Entry    Order-Level Documents:   There are no order-level documents.   Encounter-Level Documents - 03/01/2017:   Document on 03/06/2017 6:58 PM by Nonah Mattes Z: ED PB Billing Extract  Scan on 03/04/2017 12:52 PM by Default, Provider, MD  Scan on 03/04/2017 12:08 PM by Default, Provider, MD  Scan on 03/04/2017 11:45 AM by Default, Provider, MD  Document on 03/03/2017 5:31 PM by Wendee Copp, RN: IP After Visit Summary  Scan on 03/02/2017 7:37 PM by Default, Provider, MD  Electronic signature on 03/02/2017 1:57 PM - Signed  Scan on 03/02/2017 11:55 AM by Default, Provider, MD  Scan on 03/02/2017 6:39 AM by Default, Provider, MD  Scan on 03/03/2017 9:06 AM by Default, Provider, MD  Electronic signature on 03/01/2017 7:49 PM - Signed  Electronic signature on 03/01/2017 7:48 PM - Signed    Signed   Electronically signed by Thayer Headings, MD on 03/02/17 at 24 EST Printable Result Report    Result Report  External Result Report    External Result Report      Neuro/Psych Dementia    GI/Hepatic   Endo/Other    Renal/GU Renal InsufficiencyRenal disease     Musculoskeletal  (+) Arthritis ,   Abdominal   Peds  Hematology   Anesthesia Other Findings   Reproductive/Obstetrics                           Anesthesia Physical Anesthesia Plan  ASA: III  Anesthesia Plan: Spinal   Post-op Pain Management:  Regional for Post-op pain   Induction:   PONV Risk Score and Plan: 2 and Treatment may vary due to age or medical condition  Airway Management Planned: Natural Airway and Nasal Cannula  Additional Equipment:   Intra-op Plan:   Post-operative Plan:   Informed Consent: I have reviewed the patients History and Physical, chart, labs and discussed the procedure including the risks, benefits and alternatives for the proposed anesthesia with the patient or authorized representative who has indicated his/her understanding and acceptance.     Plan Discussed with:   Anesthesia Plan Comments:         Anesthesia  Quick Evaluation

## 2017-05-03 NOTE — Op Note (Signed)
05/03/2017  2:30 PM  PATIENT:  Derek Carr  84 y.o. male  PRE-OPERATIVE DIAGNOSIS:  Left ankle bimalleolar fracture  POST-OPERATIVE DIAGNOSIS:  Left ankle trimalleolar fracture  Procedure(s): 1.  Open treatment of left ankle trimalleolar fracture with internal fixation without fixation of the posterior lip 2.  AP, mortise and lateral xrays of the left ankle  SURGEON:  Wylene Simmer, MD  ASSISTANT: Mechele Claude, PA-C  ANESTHESIA:  spinal  EBL:  minimal   TOURNIQUET:   Total Tourniquet Time Documented: Thigh (Left) - 43 minutes Total: Thigh (Left) - 43 minutes  COMPLICATIONS:  None apparent  DISPOSITION:  Extubated, awake and stable to recovery.  INDICATION FOR PROCEDURE: The patient is an 84 year old male with past medical history significant for dementia.  He sustained a left ankle fracture approximately 2 weeks ago.  This has displaced and is unstable.  He presents now for operative treatment of this injury.  He and his family understand the risks and benefits of the alternative treatment options and elects surgical treatment.  They specifically understand risks of bleeding, infection, nerve damage, blood clots, post traumatic arthritis, amputation and death.  PROCEDURE IN DETAIL:  After pre operative consent was obtained, and the correct operative site was identified, the patient was brought to the operating room and placed supine on the OR table.  Anesthesia was administered.  Pre-operative antibiotics were administered.  A surgical timeout was taken.  The left lower extremity was prepped and draped in standard sterile fashion with a tourniquet around the thigh.  The extremity was elevated and the tourniquet was inflated to 250 mmHg.  A longitudinal incision was made over the lateral malleolus.  Dissection was carried down through the subcutaneous tissues to the fracture site.  Fracture was mobilized and cleaned of all hematoma.  Was irrigated.  It was reduced and held with a  tenaculum.  A 3.5 mm fully threaded lag screw was inserted from posterior to anterior across the fracture site.  A 7 hole one third tubular plate from the [employer] small frag set was then contoured to fit the lateral malleolus.  It was secured distally with 3 unicortical screws and proximally with 4 bicortical screws.  Attention was then turned to the medial malleolus.  An incision was made and dissection was carried down through the subtenons tissues to the fracture site.  The fracture was cleaned of all hematoma and was reduced.  It was held with a tenaculum.  2 4 mm x 40 mm partially-threaded cannulated screws were inserted across the fracture site.  There were noted to have excellent purchase and compressed the fracture site appropriately.  Final AP, lateral and mortise radiographs of the left ankle showed interval reduction and fixation of the medial and lateral malleolus fractures.  A posterior malleolus fracture was identified at that time and was noted to be anatomically reduced.  It was small and did not require fixation.  Both wounds were then irrigated copiously and closed with Vicryl, Monocryl and nylon.  Sterile dressings were applied followed by a well-padded short leg splint.  The tourniquet was released after application of the dressings.  The patient was awakened from anesthesia and transported to the recovery room in stable condition.   FOLLOW UP PLAN: The patient will be admitted to the inpatient ward for physical therapy and possible rehab placement.  Lovenox for DVT prophylaxis.  Nonweightbearing on the left lower extremity.   RADIOGRAPHS: AP, mortise and lateral radiographs of the left ankle are obtained intraoperatively.  These show interval reduction and fixation of the trimalleolar ankle fracture.  Hardware is appropriately positioned and of the appropriate lengths.  No other acute injuries are noted.    Mechele Claude PA-C was present and scrubbed for the duration of the  operative case. His assistance was essential in positioning the patient, prepping and draping, gaining and maintaining exposure, performing the operation, closing and dressing the wounds and applying the splint.

## 2017-05-03 NOTE — Anesthesia Procedure Notes (Addendum)
Anesthesia Regional Block: Popliteal block   Pre-Anesthetic Checklist: ,, timeout performed, Correct Patient, Correct Site, Correct Laterality, Correct Procedure, Correct Position, site marked, Risks and benefits discussed,  Surgical consent,  Pre-op evaluation,  At surgeon's request and post-op pain management  Laterality: Left  Prep: chloraprep       Needles:   Needle Type: Echogenic Stimulator Needle     Needle Length: 9cm  Needle Gauge: 21   Needle insertion depth: 5 cm   Additional Needles:   Procedures:,,,, ultrasound used (permanent image in chart),,,,  Narrative:  Start time: 05/03/2017 12:00 PM End time: 05/03/2017 12:15 PM Injection made incrementally with aspirations every 5 mL.  Performed by: Personally  Anesthesiologist: Rica Koyanagi, MD

## 2017-05-03 NOTE — Progress Notes (Signed)
Spoke with pt's daughter who helps manage pt's medication and asked about Tramadol allergy. Daughter states that last time he had Tramadol he did fine and it did not cause any confusion.

## 2017-05-03 NOTE — Discharge Instructions (Addendum)
Wylene Simmer, MD [place]  Please read the following information regarding your care after surgery.  Medications  You only need a prescription for the narcotic pain medicine (ex. oxycodone, Percocet, Norco).  All of the other medicines listed below are available over the counter. X Aleve 2 pills twice a day for the first 3 days after surgery. X acetominophen (Tylenol) 650 mg every 4-6 hours as you need for minor to moderate pain   X To help prevent blood clots, take a baby aspirin (81 mg) twice a day after surgery.  You should also get up every hour while you are awake to move around.    Weight Bearing X Do not bear any weight on the operated leg or foot.  Cast / Splint / Dressing X Keep your splint, cast or dressing clean and dry.  Dont put anything (coat hanger, pencil, etc) down inside of it.  If it gets damp, use a hair dryer on the cool setting to dry it.  If it gets soaked, call the office to schedule an appointment for a cast change.   After your dressing, cast or splint is removed; you may shower, but do not soak or scrub the wound.  Allow the water to run over it, and then gently pat it dry.  Swelling It is normal for you to have swelling where you had surgery.  To reduce swelling and pain, keep your toes above your nose for at least 3 days after surgery.  It may be necessary to keep your foot or leg elevated for several weeks.  If it hurts, it should be elevated.  Follow Up Call my office at [phone] when you are discharged from the hospital or surgery center to schedule an appointment to be seen two weeks after surgery.  Call my office at [phone] if you develop a fever >101.5 F, nausea, vomiting, bleeding from the surgical site or severe pain.    DUE TO ASYMPTOMATIC HYPERTENSION DURING HOSPITALIZATION, THE PATIENT NEEDS TO FOLLOW UP WITH PRIMARY CARE PROVIDER (DR. MARK PERINI) AS SOON AS POSSIBLE.

## 2017-05-03 NOTE — Transfer of Care (Signed)
Immediate Anesthesia Transfer of Care Note  Patient: Derek Carr  Procedure(s) Performed: OPEN REDUCTION INTERNAL FIXATION (ORIF) BIMALLEOLAR  ANKLE FRACTURE (Left Ankle)  Patient Location: PACU  Anesthesia Type:General  Level of Consciousness: awake, alert  and oriented  Airway & Oxygen Therapy: Patient Spontanous Breathing  Post-op Assessment: Report given to RN and Post -op Vital signs reviewed and stable  Post vital signs: Reviewed and stable  Last Vitals:  Vitals Value Taken Time  BP    Temp    Pulse 96 05/03/2017  2:28 PM  Resp 17 05/03/2017  2:28 PM  SpO2 98 % 05/03/2017  2:28 PM  Vitals shown include unvalidated device data.  Last Pain:  Vitals:   05/03/17 1020  TempSrc:   PainSc: 0-No pain      Patients Stated Pain Goal: 2 (53/66/44 [id number])  Complications: No apparent anesthesia complications

## 2017-05-03 NOTE — Anesthesia Procedure Notes (Signed)
Spinal  Patient location during procedure: OR Start time: 05/03/2017 1:00 PM End time: 05/03/2017 1:13 PM Staffing Anesthesiologist: Rica Koyanagi, MD Preanesthetic Checklist Completed: patient identified, site marked, pre-op evaluation, timeout performed, IV checked, risks and benefits discussed and monitors and equipment checked Spinal Block Patient position: sitting Prep: Betadine Patient monitoring: heart rate, cardiac monitor, continuous pulse ox and blood pressure Approach: right paramedian Location: L3-4 Needle Needle type: Quincke  Needle gauge: 22 G Needle length: 9 cm Needle insertion depth: 4 cm Assessment Sensory level: T6

## 2017-05-04 ENCOUNTER — Encounter (HOSPITAL_COMMUNITY): Payer: Self-pay | Admitting: General Practice

## 2017-05-04 ENCOUNTER — Inpatient Hospital Stay: Payer: Medicare HMO

## 2017-05-04 ENCOUNTER — Ambulatory Visit: Payer: Medicare HMO | Admitting: Physical Therapy

## 2017-05-04 ENCOUNTER — Encounter: Payer: Medicare HMO | Admitting: Occupational Therapy

## 2017-05-04 DIAGNOSIS — I10 Essential (primary) hypertension: Secondary | ICD-10-CM

## 2017-05-04 DIAGNOSIS — S82842D Displaced bimalleolar fracture of left lower leg, subsequent encounter for closed fracture with routine healing: Secondary | ICD-10-CM

## 2017-05-04 DIAGNOSIS — D62 Acute posthemorrhagic anemia: Secondary | ICD-10-CM

## 2017-05-04 DIAGNOSIS — C61 Malignant neoplasm of prostate: Secondary | ICD-10-CM

## 2017-05-04 DIAGNOSIS — G459 Transient cerebral ischemic attack, unspecified: Secondary | ICD-10-CM

## 2017-05-04 DIAGNOSIS — E785 Hyperlipidemia, unspecified: Secondary | ICD-10-CM

## 2017-05-04 DIAGNOSIS — G8918 Other acute postprocedural pain: Secondary | ICD-10-CM

## 2017-05-04 DIAGNOSIS — F0390 Unspecified dementia without behavioral disturbance: Secondary | ICD-10-CM

## 2017-05-04 MED ORDER — DOCUSATE SODIUM 100 MG PO CAPS: 100.0000 mg | ORAL_CAPSULE | Freq: Two times a day (BID) | ORAL | 0 refills | Status: DC

## 2017-05-04 MED ORDER — SENNA 8.6 MG PO TABS: 2.0000 | ORAL_TABLET | Freq: Two times a day (BID) | ORAL | 0 refills | Status: DC

## 2017-05-04 MED ORDER — ASPIRIN EC 81 MG PO TBEC: 81.0000 mg | DELAYED_RELEASE_TABLET | Freq: Two times a day (BID) | ORAL | 0 refills | Status: DC

## 2017-05-04 NOTE — Evaluation (Signed)
Physical Therapy Evaluation Patient Details Name: Derek Carr MRN: [id number] DOB: 1931/10/18 Today's Date: 05/04/2017   History of Present Illness  84yo male who sustained a L ankle fracture 2 weeks ago after tripping/falling over a rock, the fracture site was initially stable but then became displaced/unstable. He received surgery for L ankle ORIF on 05/03/17. PMH DDD, OA, HTN, hernia, prostate CA, cardiac cath, lumbar surgery, R THA with hx of revision   Clinical Impression   Patient received up in chair with daughter present, pleasant and willing to participate in PT eval today. Of note, patient and family report ongoing chronic issues with orthostatic hypotension and on/off issues with gait and balance that he had been going to PT for in the past. He requires MaxA to perform partial stand while maintaining NWB L LE today, only able to perform partial transfer due to balance difficulties and difficulty in transitioning to walker while maintaining weight bearing status with assist of only +1. Unsafe to further progress mobility today without +2 assistance. He will continue to benefit from skilled PT services in the acute setting, and will likely strongly benefit from aggressive therapies in the CIR setting moving forward.     Follow Up Recommendations CIR    Equipment Recommendations  Other (comment)(TBD )    Recommendations for Other Services Rehab consult     Precautions / Restrictions Precautions Precautions: Fall Restrictions Weight Bearing Restrictions: Yes LLE Weight Bearing: Non weight bearing      Mobility  Bed Mobility               General bed mobility comments: DNT, received up in chair   Transfers Overall transfer level: Needs assistance Equipment used: Rolling walker (2 wheeled) Transfers: Sit to/from Stand Sit to Stand: Max assist         General transfer comment: performed multiple attempts for sit to stand with NWB status L LE today, patient with  significant difficulty maintaing NWB and unsteady when coming to full stand ; unable to progress mobility further due to safety concerns/without +2 assist   Ambulation/Gait             General Gait Details: unable   Stairs            Wheelchair Mobility    Modified Rankin (Stroke Patients Only)       Balance Overall balance assessment: Needs assistance Sitting-balance support: Bilateral upper extremity supported;Feet supported Sitting balance-Leahy Scale: Good     Standing balance support: Bilateral upper extremity supported;During functional activity Standing balance-Leahy Scale: Poor                               Pertinent Vitals/Pain Pain Assessment: No/denies pain    Home Living Family/patient expects to be discharged to:: Private residence Living Arrangements: Spouse/significant other Available Help at Discharge: Family;Available 24 hours/day Type of Home: House Home Access: Stairs to enter;Ramped entrance Entrance Stairs-Rails: (has rail in the front ) Entrance Stairs-Number of Steps: 4 front; 2 in back but they now have a ramp in the back he will be able to use  Home Layout: One level Home Equipment: Crutches;Cane - single point;Walker - 2 wheels;Bedside commode      Prior Function Level of Independence: Independent with assistive device(s)         Comments: Used cane for mobility, otherwise was independent.      Hand Dominance   Dominant Hand: Right  Extremity/Trunk Assessment   Upper Extremity Assessment Upper Extremity Assessment: Defer to OT evaluation    Lower Extremity Assessment Lower Extremity Assessment: Generalized weakness    Cervical / Trunk Assessment Cervical / Trunk Assessment: Kyphotic  Communication   Communication: No difficulties  Cognition Arousal/Alertness: Awake/alert Behavior During Therapy: WFL for tasks assessed/performed Overall Cognitive Status: Within Functional Limits for tasks assessed                                         General Comments      Exercises     Assessment/Plan    PT Assessment Patient needs continued PT services  PT Problem List Decreased strength;Decreased mobility;Decreased safety awareness;Decreased coordination;Decreased knowledge of precautions;Decreased balance       PT Treatment Interventions DME instruction;Therapeutic activities;Gait training;Therapeutic exercise;Patient/family education;Stair training;Balance training;Functional mobility training;Neuromuscular re-education;Wheelchair mobility training;Manual techniques    PT Goals (Current goals can be found in the Care Plan section)  Acute Rehab PT Goals Patient Stated Goal: to go to rehab and get moving better  PT Goal Formulation: With patient/family Time For Goal Achievement: 05/18/17 Potential to Achieve Goals: Good    Frequency Min 4X/week   Barriers to discharge        Co-evaluation               AM-PAC PT "6 Clicks" Daily Activity  Outcome Measure Difficulty turning over in bed (including adjusting bedclothes, sheets and blankets)?: Unable Difficulty moving from lying on back to sitting on the side of the bed? : Unable Difficulty sitting down on and standing up from a chair with arms (e.g., wheelchair, bedside commode, etc,.)?: Unable Help needed moving to and from a bed to chair (including a wheelchair)?: A Lot Help needed walking in hospital room?: Total Help needed climbing 3-5 steps with a railing? : Total 6 Click Score: 7    End of Session Equipment Utilized During Treatment: Gait belt Activity Tolerance: Patient tolerated treatment well Patient left: in chair;with call bell/phone within reach;with family/visitor present   PT Visit Diagnosis: Unsteadiness on feet (R26.81);Muscle weakness (generalized) (M62.81);History of falling (Z91.81);Difficulty in walking, not elsewhere classified (R26.2)    Time: 5885-0277 PT Time Calculation (min)  (ACUTE ONLY): 30 min   Charges:   PT Evaluation $PT Eval Moderate Complexity: 1 Mod PT Treatments $Therapeutic Activity: 8-22 mins   PT G Codes:        Deniece Ree PT, DPT, CBIS  Supplemental Physical Therapist [place]   Pager [phone]

## 2017-05-04 NOTE — Consult Note (Signed)
Physical Medicine and Rehabilitation Consult Reason for Consult: Decreased functional mobility Referring Physician: Dr. Doran Durand   HPI: Derek Carr is a 84 y.o. right-handed male with history of mild dementia maintained on Aricept, prostate cancer, hyperlipidemia, TIA.  Per chart review, daughter, wife, and patient, patient lives with spouse.  Independent with a cane prior to admission.  One level home with ramped entry presented 05/03/2017 after a recent fall sustaining a left bimalleolar ankle fracture that was initially stable and splinted later became displaced after follow-up with orthopedic services and underwent ORIF 05/03/2017 per Dr. Doran Durand.  Hospital course pain management.  Nonweightbearing left lower extremity.  Subcutaneous Lovenox for DVT prophylaxis.  Physical therapy evaluation completed with recommendations of physical medicine rehab consult.  Review of Systems  Constitutional: Negative for chills and fever.  HENT: Negative for hearing loss.   Eyes: Negative for blurred vision and double vision.  Respiratory: Negative for shortness of breath.   Cardiovascular: Negative for chest pain and leg swelling.  Gastrointestinal: Positive for constipation. Negative for nausea and vomiting.  Genitourinary: Negative for dysuria, flank pain and hematuria.  Musculoskeletal: Positive for falls. Negative for myalgias.  Skin: Negative for rash.  Psychiatric/Behavioral: Positive for memory loss.  All other systems reviewed and are negative.  Past Medical History:  Diagnosis Date  . Anemia   . Ankle fracture    left  . Atherosclerosis   . Complication of anesthesia    CONFUSION - Pt's family very concerned about this  . DDD (degenerative disc disease)   . Degenerative arthritis   . Dermatitis, atopic ARMS AND LEGS  . Diverticulosis   . Erectile dysfunction   . Frequency of urination   . Glaucoma   . History of asbestos exposure   . History of radiation therapy 8/19-13-10/18/11    prostate, 95 GY  . History of shingles 2012-- BILATERAL EYES--  NO RESIDUAL  . Hyperlipidemia   . Hypertension   . Inguinal hernia   . Lumbar scoliosis   . Nocturia   . OA (osteoarthritis) of hip RIGHT  . Prostate cancer ([place]) 05/11/2011   bx=Adenocarcinoma,gleason3+4=7, 4+4=8,PSA=10.30volume=45.7cc  . Renal cyst    bilateral  . Smokers' cough ([place])   . Thyroid cyst    Past Surgical History:  Procedure Laterality Date  . ATTEMPTED LEFT VATS/ LEFT THORACOTOMY/ RESECTION OF THE ENORMOUS, PROBABLE BRONCHOGENIC CYST  01-04-2005  DR Arlyce Dice  . CARDIAC CATHETERIZATION  02-24-2009  DR NASHER   MINOR CORONARY ARTERY IRREGULARITIES/ NORMAL LVSF/ EF 65-70%  . CATARACT EXTRACTION    . COLONOSCOPY W/ POLYPECTOMY    . CYSTOSCOPY  11/15/2011   Procedure: CYSTOSCOPY;  Surgeon: Dutch Gray, MD;  Location: [facility];  Service: Urology;  Laterality: N/A;  no seeds seen in bladder  . esophageus cyst removal  YRS AGO  . [place]  . ORIF ANKLE FRACTURE Left 05/03/2017  . PROSTATE BIOPSY  05/11/11   Adenocarcinoma ([facility])  . RADIOACTIVE SEED IMPLANT  11/15/2011   Procedure: RADIOACTIVE SEED IMPLANT;  Surgeon: Dutch Gray, MD;  Location: [facility];  Service: Urology;  Laterality: N/A;  Total number of seeds - 52  . REPAIR RIGHT INGUINAL HERNIA W/ MESH  09-10-1999  . TOTAL HIP ARTHROPLASTY Right 03/20/2013   Procedure: RIGHT TOTAL HIP ARTHROPLASTY ANTERIOR APPROACH;  Surgeon: [name], MD;  Location: [facility];  Service: Orthopedics;  Laterality: Right;  . TOTAL HIP REVISION Right 05/14/2013  Procedure: open reduction internal fixation REVISION RIGHT HIP ;  Surgeon: [name], MD;  Location: [facility];  Service: Orthopedics;  Laterality: Right;  . UMBILICAL HERNIA REPAIR  1998   epigastric   Family History  Problem Relation Age of Onset  . Hypertension Mother   . Prostate cancer Brother        surgery/cured  . Lung cancer Brother         new primary/same brother   Social History:  reports that he has been smoking cigarettes.  He has a 31.00 pack-year smoking history. He has never used smokeless tobacco. He reports that he does not drink alcohol or use drugs. Allergies:  Allergies  Allergen Reactions  . Dilaudid [Hydromorphone Hcl] Other (See Comments)    Confusion   . Methocarbamol Other (See Comments)    Drowsiness   . Percodan [Oxycodone-Aspirin] Other (See Comments)    Confusion   . Tramadol Other (See Comments)    Drowsiness   . Vicodin [Hydrocodone-Acetaminophen] Other (See Comments)    Confusion    Medications Prior to Admission  Medication Sig Dispense Refill  . acetaminophen (TYLENOL) 500 MG tablet Take 1,000 mg by mouth 2 (two) times daily as needed (for pain).     Marland Kitchen aspirin 81 MG EC tablet Take 81 mg by mouth daily.    . Calcium Carbonate Antacid 1177 MG CHEW Chew 1 tablet by mouth daily.     . cetirizine (ZYRTEC) 10 MG tablet Take 10 mg by mouth daily.    . Cholecalciferol (VITAMIN D-3) 1000 units CAPS Take 1 capsule by mouth daily.     . Cyanocobalamin (VITAMIN B12) 1000 MCG TBCR Take 1 tablet by mouth daily.     . Docosahexaenoic Acid (DHA OMEGA 3 PO) Take 1,000 mg by mouth daily.     Marland Kitchen ezetimibe (ZETIA) 10 MG tablet Take 10 mg by mouth every morning.     . folic acid (FOLVITE) 1 MG tablet Take 1 mg by mouth daily.    . Glucos-Chond-Hyal Ac-Ca Fructo (MOVE FREE JOINT HEALTH ADVANCE PO) Take 1 tablet by mouth daily.    . irbesartan (AVAPRO) 75 MG tablet Take 37.5 mg by mouth daily. **REPLACES VALSARTAN**  11  . pravastatin (PRAVACHOL) 80 MG tablet Take 80 mg by mouth every evening.     . Probiotic Product (PROBIOTIC PO) Take 1 capsule by mouth daily.     Marland Kitchen Ubiquinol 100 MG CAPS Take 100 mg by mouth daily.    . valACYclovir (VALTREX) 1000 MG tablet Take 1,000 mg by mouth daily.     Marland Kitchen donepezil (ARICEPT) 10 MG tablet Take 1 tablet (10 mg total) by mouth at bedtime. Start 1/2 tablet at bedtime x 4  weeks then 1 tablet at night 30 tablet 3  . mirabegron ER (MYRBETRIQ) 50 MG TB24 tablet Take 50 mg by mouth daily.       Home: Home Living Family/patient expects to be discharged to:: Private residence Living Arrangements: Spouse/significant other Available Help at Discharge: Family, Available 24 hours/day Type of Home: House Home Access: Stairs to enter, Ramped entrance [employer] of Steps: 4 front; 2 in back but they now have a ramp in the back he will be able to use  Entrance Stairs-Rails: (has rail in the front ) Home Layout: One level Bathroom Shower/Tub: Chiropodist: [place]: [place] - single point, [place] - 2 wheels, Bedside commode  Functional History: Prior Function Level of Independence: Independent with  assistive device(s) Comments: Used cane for mobility, otherwise was independent.  Functional Status:  Mobility: Bed Mobility General bed mobility comments: DNT, received up in chair  Transfers Overall transfer level: Needs assistance Equipment used: Rolling walker (2 wheeled) Transfers: Sit to/from Stand Sit to Stand: Max assist General transfer comment: performed multiple attempts for sit to stand with NWB status L LE today, patient with significant difficulty maintaing NWB and unsteady when coming to full stand ; unable to progress mobility further due to safety concerns/without +2 assist  Ambulation/Gait General Gait Details: unable     ADL:    Cognition: Cognition Overall Cognitive Status: Within Functional Limits for tasks assessed Orientation Level: Oriented X4 Cognition Arousal/Alertness: Awake/alert Behavior During Therapy: WFL for tasks assessed/performed Overall Cognitive Status: Within Functional Limits for tasks assessed  Blood pressure (!) 162/88, pulse 60, temperature 97.9 F (36.6 C), temperature source Oral, resp. rate 18, height 6\' 3"  (1.905 m), weight 82.6 kg (182 lb), SpO2 100  %. Physical Exam  Vitals reviewed. Constitutional: He is oriented to person, place, and time. He appears well-developed and well-nourished.  HENT:  Head: Normocephalic and atraumatic.  Eyes: EOM are normal. Right eye exhibits no discharge. Left eye exhibits no discharge.  Neck: Normal range of motion. Neck supple. No thyromegaly present.  Cardiovascular: Normal rate, regular rhythm and normal heart sounds.  Respiratory: Effort normal and breath sounds normal. No respiratory distress.  GI: Soft. Bowel sounds are normal. He exhibits no distension.  Musculoskeletal:  LLE edema and tenderness  Neurological: He is alert and oriented to person, place, and time.  Provides his name, age follows full commands.   Limited medical historian. Motor: B/l UE, RLE 5/5 proximal to distal LLE: HF 4-/5, KE 4-/5, wiggles toes (pain inhibition)  Skin:  Soft short leg splint in place to left lower extremity.  Psychiatric: He has a normal mood and affect. His behavior is normal.    No results found for this or any previous visit (from the past 24 hour(s)). No results found.  Assessment/Plan: Diagnosis: Left ankle fracture Labs and images independently reviewed.  Records reviewed and summated above.  1. Does the need for close, 24 hr/day medical supervision in concert with the patient's rehab needs make it unreasonable for this patient to be served in a less intensive setting? No  2. Co-Morbidities requiring supervision/potential complications: pain management (Biofeedback training with therapies to help reduce reliance on opiate pain medications, monitor pain control during therapies, and sedation at rest and titrate to maximum efficacy to ensure participation and gains in therapies), mild dementia (cont meds), prostate cancer, hyperlipidemia, TIA, reactive HTN (monitor and provide prns in accordance with increased physical exertion and pain), ABLA (transfuse if necessary to ensure appropriate perfusion for  increased activity tolerance) 3. Due to safety, skin/wound care, disease management, pain management and patient education, does the patient require 24 hr/day rehab nursing? Yes 4. Does the patient require coordinated care of a physician, rehab nurse, PT (1-2 hrs/day, 5 days/week) and OT (1-2 hrs/day, 5 days/week) to address physical and functional deficits in the context of the above medical diagnosis(es)? Potentially Addressing deficits in the following areas: balance, endurance, locomotion, strength, transferring, bathing, dressing, toileting and psychosocial support 5. Can the patient actively participate in an intensive therapy program of at least 3 hrs of therapy per day at least 5 days per week? Yes 6. The potential for patient to make measurable gains while on inpatient rehab is excellent 7. Anticipated functional outcomes upon discharge from  inpatient rehab are min assist  with PT, min assist with OT, n/a with SLP. 8. Estimated rehab length of stay to reach the above functional goals is: 10-15 days. 9. Anticipated D/C setting: Other 10. Anticipated post D/C treatments: HH therapy and Home excercise program 11. Overall Rehab/Functional Prognosis: excellent and good  RECOMMENDATIONS: This patient's condition is appropriate for continued rehabilitative care in the following setting: Pt does not have a medical need to warrant IRF.  Recommend home with [facility], if caregiver support not available, recommend SNF. Patient has agreed to participate in recommended program. Potentially Note that insurance prior authorization may be required for reimbursement for recommended care.  Comment: Rehab Admissions Coordinator to follow up.  Delice Lesch, MD, ABPMR Lavon Paganini Angiulli, PA-C 05/04/2017

## 2017-05-04 NOTE — Evaluation (Addendum)
Occupational Therapy Evaluation Patient Details Name: Derek Carr MRN: [id number] DOB: 10-06-1931 Today's Date: 05/04/2017    History of Present Illness 84yo male who sustained a L ankle fracture 2 weeks ago after tripping/falling over a rock, the fracture site was initially stable but then became displaced/unstable. He received surgery for L ankle ORIF on 05/03/17. PMH DDD, OA, HTN, hernia, prostate CA, cardiac cath, lumbar surgery, R THA with hx of revision    Clinical Impression   This 84 y/o M presents with the above. At baseline pt reports mod independence with ADLs and functional mobility using cane. Pt presents sitting up in recliner, pleasant and willing to participate in therapy session. Pt currently requiring MaxA (+2) for squat pivot transfers, increased assist needed as pt with some difficulty maintaining NWB in LLE; currently requires maxA for LB ADLs, setup-minguard for UB ADLs. Pt strongly motivated to work with therapy and progress to PLOF. Feel pt is appropriate for CIR level services. Will continue to follow acutely to progress pt towards established OT goals.     Follow Up Recommendations  CIR;Supervision/Assistance - 24 hour    Equipment Recommendations  Other (comment)(TBD in next venue )           Precautions / Restrictions Precautions Precautions: Fall Restrictions Weight Bearing Restrictions: Yes LLE Weight Bearing: Non weight bearing      Mobility Bed Mobility Overal bed mobility: Needs Assistance Bed Mobility: Sit to Supine       Sit to supine: Min assist   General bed mobility comments: MinA for advancing LLE onto bed  Transfers Overall transfer level: Needs assistance   Transfers: Squat Pivot Transfers     Squat pivot transfers: Max assist;+2 safety/equipment     General transfer comment: pt completing squat pivot transfer recliner>EOB, verbal cues for technique and hand placement with MaxA to complete transfer, spouse present and  providing supervision/assist to ensure NWB in LLE as pt with difficulty maintaining     Balance Overall balance assessment: Needs assistance Sitting-balance support: Bilateral upper extremity supported;Feet supported Sitting balance-Leahy Scale: Good                                     ADL either performed or assessed with clinical judgement   ADL Overall ADL's : Needs assistance/impaired Eating/Feeding: Modified independent;Sitting   Grooming: Set up;Sitting   Upper Body Bathing: Min guard;Sitting   Lower Body Bathing: Moderate assistance;Sitting/lateral leans   Upper Body Dressing : Min guard;Sitting   Lower Body Dressing: Maximal assistance;Sitting/lateral leans   Toilet Transfer: Maximal assistance;+2 for physical assistance;Squat-pivot;BSC;+2 for safety/equipment Toilet Transfer Details (indicate cue type and reason): simulated in transfer recliner to EOB  Toileting- Clothing Manipulation and Hygiene: Maximal assistance;Sitting/lateral lean       Functional mobility during ADLs: Maximal assistance;+2 for safety/equipment(for squat pivot transfer )                           Pertinent Vitals/Pain Pain Assessment: No/denies pain     Hand Dominance Right   Extremity/Trunk Assessment Upper Extremity Assessment Upper Extremity Assessment: Overall WFL for tasks assessed   Lower Extremity Assessment Lower Extremity Assessment: Defer to PT evaluation   Cervical / Trunk Assessment Cervical / Trunk Assessment: Kyphotic   Communication Communication Communication: No difficulties   Cognition Arousal/Alertness: Awake/alert Behavior During Therapy: WFL for tasks assessed/performed Overall Cognitive Status: Within Functional  Limits for tasks assessed                                     General Comments  spouse present during session                [place] expects to be discharged to:: Private  residence Living Arrangements: Spouse/significant other Available Help at Discharge: Family;Available 24 hours/day Type of Home: House Home Access: Stairs to enter;Ramped entrance Entrance Stairs-Number of Steps: 4 front; 2 in back but they now have a ramp in the back he will be able to use  Entrance Stairs-Rails: (rail in the front ) Home Layout: One level     Bathroom Shower/Tub: Teacher, early years/pre: Standard     Home Equipment: Crutches;Cane - single point;Walker - 2 wheels;Bedside commode          Prior Functioning/Environment Level of Independence: Independent with assistive device(s)        Comments: Used cane for mobility, otherwise was independent.         OT Problem List: Decreased strength;Impaired balance (sitting and/or standing);Decreased knowledge of precautions;Decreased activity tolerance;Decreased knowledge of use of DME or AE      OT Treatment/Interventions: Self-care/ADL training;DME and/or AE instruction;Therapeutic activities;Balance training;Therapeutic exercise;Energy conservation;Patient/family education    OT Goals(Current goals can be found in the care plan section) Acute Rehab OT Goals Patient Stated Goal: to go to rehab and get moving better  OT Goal Formulation: With patient Time For Goal Achievement: 05/18/17 Potential to Achieve Goals: Good  OT Frequency: Min 2X/week                             AM-PAC PT "6 Clicks" Daily Activity     Outcome Measure Help from another person eating meals?: None Help from another person taking care of personal grooming?: A Little Help from another person toileting, which includes using toliet, bedpan, or urinal?: A Lot Help from another person bathing (including washing, rinsing, drying)?: A Lot Help from another person to put on and taking off regular upper body clothing?: None Help from another person to put on and taking off regular lower body clothing?: A Lot 6 Click Score:  17   End of Session Equipment Utilized During Treatment: Gait belt Nurse Communication: Mobility status  Activity Tolerance: Patient tolerated treatment well Patient left: in bed;with call bell/phone within reach;with family/visitor present  OT Visit Diagnosis: Other abnormalities of gait and mobility (R26.89)                Time: 2549-8264 OT Time Calculation (min): 23 min Charges:  OT General Charges $OT Visit: 1 Visit OT Evaluation $OT Eval Moderate Complexity: 1 Mod G-Codes:     Lou Cal, OT Pager [phone] 05/04/2017   [name] 05/04/2017, 4:16 PM

## 2017-05-04 NOTE — Progress Notes (Signed)
Subjective: 1 Day Post-Op Procedure(s) (LRB): OPEN REDUCTION INTERNAL FIXATION (ORIF) BIMALLEOLAR  ANKLE FRACTURE (Left)  Patient reports pain as mild.  Resting comfortably in bed this am.  Reports that he had a good night and is ready for breakfast.  Daughters by his side.  Denies fever, chills, N/V, CP, SOB.    Objective:   VITALS:  Temp:  [97 F (36.1 C)-98.2 F (36.8 C)] 97.9 F (36.6 C) (04/10 0432) Pulse Rate:  [49-96] 60 (04/10 0432) Resp:  [10-23] 18 (04/10 0432) BP: (146-207)/(83-114) 162/88 (04/10 0432) SpO2:  [97 %-100 %] 100 % (04/10 0432) Arterial Line BP: (179)/(66) 179/66 (04/09 1429) Weight:  [82.6 kg (182 lb)] 82.6 kg (182 lb) (04/09 1014)  General: WDWN patient in NAD. Psych:  Appropriate mood and affect. Neuro:  A&O x 3, Moving all extremities, sensation intact to light touch HEENT:  EOMs intact Chest:  Even non-labored respirations Skin:  SLS C/D/I, no rashes or lesions Extremities: warm/dry, mild edema, no erythema or echymosis.  No lymphadenopathy. Pulses: Popliteus 2+ MSK:  ROM: EHL/FHL intact, MMT: able to perform quad set    LABS Recent Labs    05/03/17 1039  HGB 11.4*  WBC 3.3*  PLT 228   Recent Labs    05/03/17 1039  NA 141  K 4.2  CL 109  CO2 24  BUN 16  CREATININE 1.16  GLUCOSE 83   No results for input(s): LABPT, INR in the last 72 hours.   Assessment/Plan: 1 Day Post-Op Procedure(s) (LRB): OPEN REDUCTION INTERNAL FIXATION (ORIF) BIMALLEOLAR  ANKLE FRACTURE (Left)  NWB LLE Up with therapy Disposition pending' Plan for 2 week outpatient post-op visit with Dr. Doran Durand ASA for DVT prophylaxis upon D/C.  Mechele Claude PA-C [facility] Office:  [phone]

## 2017-05-04 NOTE — Care Management Note (Addendum)
Case Management Note  Patient Details  Name: Derek Carr MRN: [id number] Date of Birth: Jan 05, 1932  Subjective/Objective:    Fall, Left Ankle ORIF                Action/Plan: NCM spoke to pt and wife at bedside. Gave permission to speak to dtr, Robin. Explained IP rehab vs SNF rehab. IP rehab consult placed. Contacted dtr, Robin and left HIPAA compliant message on voicemail.   NCM spoke to dtr, Robin. Her preference is IP rehab. Discussed SNF, explained CSW will follow up on SNF.    Expected Discharge Date:                  Expected Discharge Plan:  [place]  In-House Referral:  Clinical Social Work  Discharge planning Services  CM Consult  Post Acute Care Choice:  NA Choice offered to:  NA  DME Arranged:  N/A DME Agency:  NA  HH Arranged:  NA HH Agency:  NA  Status of Service:  In process, will continue to follow  If discussed at Long Length of Stay Meetings, dates discussed:    Additional Comments:  Erenest Rasher, RN 05/04/2017, 1:04 PM

## 2017-05-05 ENCOUNTER — Ambulatory Visit: Payer: Medicare HMO | Admitting: Speech Pathology

## 2017-05-05 NOTE — Progress Notes (Signed)
Subjective: 2 Days Post-Op Procedure(s) (LRB): OPEN REDUCTION INTERNAL FIXATION (ORIF) BIMALLEOLAR  ANKLE FRACTURE (Left)  Patient reports pain as mild.  Tolerating POs well.  Admits to BM.  Urinating without difficulty.  Denies fever, chills, N/V, SOB, CP.  Resting comfortably in chair.  Daughter by his side.  Objective:   VITALS:  Temp:  [98 F (36.7 C)-98.9 F (37.2 C)] 98.9 F (37.2 C) (04/11 0554) Pulse Rate:  [60-82] 61 (04/11 0554) Resp:  [14] 14 (04/10 1432) BP: (120-184)/(66-92) 162/92 (04/11 0554) SpO2:  [96 %-100 %] 100 % (04/11 0554)  General: WDWN patient in NAD. Psych:  Appropriate mood and affect. Neuro:  A&O x 3, Moving all extremities, sensation intact to light touch HEENT:  EOMs intact Chest:  Even non-labored respirations Skin:  Dressing/SLS C/D/I, no rashes or lesions Extremities: warm/dry, mild edema, no erythema or echymosis.  No lymphadenopathy. Pulses: Popliteus 2+ MSK:  ROM: EHL/FHL intact, MMT: able to perform quad set    LABS Recent Labs    05/03/17 1039  HGB 11.4*  WBC 3.3*  PLT 228   Recent Labs    05/03/17 1039  NA 141  K 4.2  CL 109  CO2 24  BUN 16  CREATININE 1.16  GLUCOSE 83   No results for input(s): LABPT, INR in the last 72 hours.   Assessment/Plan: 2 Days Post-Op Procedure(s) (LRB): OPEN REDUCTION INTERNAL FIXATION (ORIF) BIMALLEOLAR  ANKLE FRACTURE (Left)  Up with therapy NWB L LE Disposition pending: CIR vs SNF Tylenol for pain control upon D/C. D/C scripts on chart Plan on 2 week outpatient post-op visit with Dr. Annia Friendly [facility] Office:  [phone]

## 2017-05-05 NOTE — Progress Notes (Addendum)
Physical Therapy Treatment Patient Details Name: Derek Carr MRN: [id number] DOB: 28-Sep-1931 Today's Date: 05/05/2017    History of Present Illness 84yo male who sustained a L ankle fracture 2 weeks ago after tripping/falling over a rock, the fracture site was initially stable but then became displaced/unstable. He received surgery for L ankle ORIF on 05/03/17. PMH DDD, OA, HTN, hernia, prostate CA, cardiac cath, lumbar surgery, R THA with hx of revision     PT Comments    Pt is progressing well with his mobility. He did better with standing and transfers today compared to yesterday and has been denied CIR admission, so he and his wife agree that SNF placement is needed for rehab before he returns home.  PT will continue to follow to progress mobility.     Follow Up Recommendations  SNF     Equipment Recommendations  Wheelchair (measurements PT);Wheelchair cushion (measurements PT)    Recommendations for Other Services   NA     Precautions / Restrictions Precautions Precautions: Fall Restrictions LLE Weight Bearing: Non weight bearing    Mobility  Bed Mobility Overal bed mobility: Needs Assistance Bed Mobility: Sit to Supine       Sit to supine: Min guard   General bed mobility comments: Min guard assist to ensure he was able to safely lift his left leg with the heavy splint on it back in the bed without dropping it.    Transfers Overall transfer level: Needs assistance Equipment used: Rolling walker (2 wheeled) Transfers: Sit to/from [facility] Sit to Stand: Mod assist;+2 physical assistance Stand pivot transfers: +2 physical assistance;Min assist       General transfer comment: Two person mod assist to stand from lower recliner chair x3 with assist at trunk to power up, assist to assess how much he was WB through his left foot (therapist's foot under his) and assist to stabilize RW and for balance.    Ambulation/Gait             General  Gait Details: Attempted to hop unsuccessfully without WB through left foot.           Balance Overall balance assessment: Needs assistance Sitting-balance support: Bilateral upper extremity supported Sitting balance-Leahy Scale: Fair Sitting balance - Comments: Any time pt would let go of the armrests on the recliner chair he would fall back again and had to use his hands to bring his trunk up to sitting.    Standing balance support: Bilateral upper extremity supported Standing balance-Leahy Scale: Poor Standing balance comment: needs external assist from RW and physical assist from PT. Pt able to stand for a few minutes at a time and preform LE exercises.                            Cognition Arousal/Alertness: Awake/alert Behavior During Therapy: WFL for tasks assessed/performed Overall Cognitive Status: History of cognitive impairments - at baseline                                 General Comments: Pt seems socially dependent on his wife to answer some questions he should be able to answer himself.       Exercises General Exercises - Lower Extremity Ankle Circles/Pumps: AROM;Right;10 reps;Other (comment)(toe wiggles on the left) Long Arc Quad: AROM;Left;10 reps Hip Flexion/Marching: AROM;Left;10 reps;Standing    General Comments General comments (skin integrity,  edema, etc.): Wife was prestent and asking questions throughout the session.  Pt educated on edema management (keeping left leg elevated),      Pertinent Vitals/Pain Pain Assessment: Faces Faces Pain Scale: No hurt           PT Goals (current goals can now be found in the care plan section) Acute Rehab PT Goals Patient Stated Goal: to go to rehab and get moving better  Progress towards PT goals: Progressing toward goals    Frequency    Min 3X/week      PT Plan Discharge plan needs to be updated;Frequency needs to be updated       AM-PAC PT "6 Clicks" Daily Activity   Outcome Measure  Difficulty turning over in bed (including adjusting bedclothes, sheets and blankets)?: Unable Difficulty moving from lying on back to sitting on the side of the bed? : Unable Difficulty sitting down on and standing up from a chair with arms (e.g., wheelchair, bedside commode, etc,.)?: Unable Help needed moving to and from a bed to chair (including a wheelchair)?: A Lot Help needed walking in hospital room?: Total Help needed climbing 3-5 steps with a railing? : Total 6 Click Score: 7    End of Session Equipment Utilized During Treatment: Gait belt Activity Tolerance: Patient tolerated treatment well Patient left: in bed;with call bell/phone within reach;with bed alarm set;with family/visitor present   PT Visit Diagnosis: Unsteadiness on feet (R26.81);Muscle weakness (generalized) (M62.81);History of falling (Z91.81);Difficulty in walking, not elsewhere classified (R26.2)     Time: 1445-1530 PT Time Calculation (min) (ACUTE ONLY): 45 min  Charges:  $Therapeutic Exercise: 8-22 mins $Therapeutic Activity: 23-37 mins          Avanelle Pixley B. [place], [place], DPT [phone]            05/05/2017, 6:09 PM

## 2017-05-05 NOTE — Progress Notes (Signed)
Inpatient Rehabilitation  Met with patient, spouse, and daughter at bedside to discuss team's recommendation for IP Rehab.  Shared recommendations for home with home health and 24/7 or SNF if 24/7 not available.  Please see consult by Dr. Posey Pronto for full details.  Discussed with nurse case manager.  Will sign off at this time.    Carmelia Roller., CCC/SLP Admission Coordinator  [place]  Cell [phone]

## 2017-05-06 MED ORDER — ACETAMINOPHEN 500 MG PO TABS
1000.0000 mg | ORAL_TABLET | Freq: Four times a day (QID) | ORAL | Status: DC | PRN
  Administered 2017-05-06 – 2017-05-08 (×4): 1000 mg via ORAL
  Filled 2017-05-06 (×5): qty 2

## 2017-05-06 NOTE — Progress Notes (Signed)
Pt's daughter concerned that pt is more confused today, confused on time, year and place which is a change from yesterday, he was A&Ox4. His conversation was also not making sense. She states that this is "how he acted that last time he had a TIA in February of this year". She asked that Dr. Doran Durand be called and ask about getting a neuro consult. MD office called and message left.

## 2017-05-06 NOTE — Clinical Social Work Note (Signed)
Clinical Social Work Assessment  Patient Details  Name: Derek Carr MRN: [id number] Date of Birth: [birth year]  Date of referral:  05/06/17               Reason for consult:  Facility Placement                Permission sought to share information with:  Facility Art therapist granted to share information::  Yes, Verbal Permission Granted  Name::     Research officer, trade union::  SNF  Relationship::  daughter  Contact Information:     Housing/Transportation Living arrangements for the past 2 months:  Single Family Home Source of Information:  Patient Patient Interpreter Needed:  None Criminal Activity/Legal Involvement Pertinent to Current Situation/Hospitalization:  No - Comment as needed Significant Relationships:  None Lives with:  Spouse Do you feel safe going back to the place where you live?  No Need for family participation in patient care:  Yes (Comment)  Care giving concerns:  Pt with new impairment and will need rehabilitative therapies before returning home.  Social Worker assessment / plan:  CSW met with patient at bedside. Pleaseant gentlemen that indicated that he resides with spouse and was independent with feeding and bathing. He indicated that he used cane and walker. He gave permission for CSW to speak with his daughter. Pt confirmed that he has been to [facility] in the past.    CSW contacted [name] and discussed SNF placement and process as well as Insurance. CSW obtained permission to send to [place] and [facility] [place]. CSW explained to daughter that if [facility] is not in our system then CSW will need to get approval to hard fax clinicals. Daughter did not seem pleased with this information. CSW advised that CSW will f/u as SNF may not even take [facility]. CSW will f/u for offers and Insurance Auth once SNF selected.  CSW will f/u for disposition.  Employment status:  Retired Office manager PT Recommendations:  [place] / Referral to community resources:  [place]  Patient/Family's Response to care:  Patient appreciative of [place] meeting face to face to discuss disposition. No issues identified. Agreeable to SNF.  Patient/Family's Understanding of and Emotional Response to Diagnosis, Current Treatment, and Prognosis:  Patient/family understands that with new impairment patient will need short term rehab before returning home as they are agreeable to SNF. Daughter concerned about the type of SNF and getting the best treatment for her father and may struggle with selecting a SNF that does not have good reviews and ratings. CSW explained that [facility] is not accepted by all SNF's either.  CSW will f/u for disposition.  Emotional Assessment Appearance:  Appears stated age Attitude/Demeanor/Rapport:  (Cooperative) Affect (typically observed):  Accepting, Appropriate Orientation:  Oriented to Self, Oriented to Place, Oriented to  Time Alcohol / Substance use:  Not Applicable Psych involvement (Current and /or in the community):  No (Comment)  Discharge Needs  Concerns to be addressed:  Discharge Planning Concerns Readmission within the last 30 days:  No Current discharge risk:  Physical Impairment, Dependent with Mobility Barriers to Discharge:  No Barriers Identified   Normajean Baxter, LCSW 05/06/2017, 4:18 PM

## 2017-05-06 NOTE — Progress Notes (Signed)
Occupational Therapy Treatment Patient Details Name: Derek Carr MRN: [id number] DOB: [birth year] Today's Date: 05/06/2017    History of present illness 84yo male who sustained a L ankle fracture 2 weeks ago after tripping/falling over a rock, the fracture site was initially stable but then became displaced/unstable. He received surgery for L ankle ORIF on 05/03/17. PMH DDD, OA, HTN, hernia, prostate CA, cardiac cath, lumbar surgery, R THA with hx of revision    OT comments  Pt. Very motivated and agreeable to participation in skilled OT.  Completed bed mobility with min guard A. LB bathing bed level and seated with set up.  Squat pivot to recliner with min guard a. Good safety awareness and sequencing for transfer and maintaining nwb.    Follow Up Recommendations  CIR;Supervision/Assistance - 24 hour    Equipment Recommendations  Other (comment)    Recommendations for Other Services      Precautions / Restrictions Precautions Precautions: Fall Restrictions Weight Bearing Restrictions: Yes LLE Weight Bearing: Non weight bearing       Mobility Bed Mobility Overal bed mobility: Needs Assistance Bed Mobility: Supine to Sit       Sit to supine: Min guard   General bed mobility comments: pt. able to guide LLE towards eob without physical assistance  Transfers Overall transfer level: Needs assistance Equipment used: None Transfers: Squat Pivot Transfers     Squat pivot transfers: Min guard     General transfer comment: good tech. with reaching for arm rest to turn body in prep for squat pivot transfer.  followed cues and maintained nwb throughout transfer    Balance                                           ADL either performed or assessed with clinical judgement   ADL Overall ADL's : Needs assistance/impaired             Lower Body Bathing: Set up;Sitting/lateral leans;Bed level Lower Body Bathing Details (indicate cue type and reason): pt.  able to wash peri areas and BLES seated in bed, finished eob         Toilet Transfer: Actuary Details (indicate cue type and reason): simualated with transfer from eob to recliner. (transfer to the R)          Functional mobility during ADLs: Minimal assistance;Cueing for sequencing;Cueing for safety General ADL Comments: good hand placement during pivot to recliner, able to maintain NWB during pivot     Vision       Perception     Praxis      Cognition Arousal/Alertness: Awake/alert Behavior During Therapy: WFL for tasks assessed/performed Overall Cognitive Status: Within Functional Limits for tasks assessed                                          Exercises     Shoulder Instructions       General Comments      Pertinent Vitals/ Pain       Pain Assessment: No/denies pain  Home Living  Prior Functioning/Environment              Frequency  Min 2X/week        Progress Toward Goals  OT Goals(current goals can now be found in the care plan section)  Progress towards OT goals: Progressing toward goals     Plan      Co-evaluation                 AM-PAC PT "6 Clicks" Daily Activity     Outcome Measure   Help from another person eating meals?: None Help from another person taking care of personal grooming?: A Little Help from another person toileting, which includes using toliet, bedpan, or urinal?: A Lot Help from another person bathing (including washing, rinsing, drying)?: A Lot Help from another person to put on and taking off regular upper body clothing?: None Help from another person to put on and taking off regular lower body clothing?: A Lot 6 Click Score: 17    End of Session Equipment Utilized During Treatment: Gait belt  OT Visit Diagnosis: Other abnormalities of gait and mobility (R26.89)   Activity Tolerance Patient  tolerated treatment well   Patient Left in chair;with call bell/phone within reach;Other (comment)(pt. requesting i  help him call his dtr. assistecd with dialing the phone for him)   Nurse Communication          Time: 1000-1015 OT Time Calculation (min): 15 min  Charges: OT General Charges $OT Visit: 1 Visit OT Treatments $Self Care/Home Management : 8-22 mins   Janice Coffin, COTA/L 05/06/2017, 10:26 AM

## 2017-05-06 NOTE — Care Management Important Message (Signed)
Important Message  Patient Details  Name: Derek Carr MRN: [id number] Date of Birth: 04-30-1931   Medicare Important Message Given:  Yes    Orbie Pyo 05/06/2017, 2:07 PM

## 2017-05-06 NOTE — Social Work (Signed)
CSW went to room to meet with Daughter [name] at bedside, however daughter was completing mental status screening to see if he was oriented. CSW unable to wait for that to finish. CSW advised that CSW will f/u.  CSW will have weekend CSW contact daughter, [name] to discuss offers.  CSW received a called back form [employer] [facility]  in [place] that they are not a provider for [employer].  CSW will f/u with patient/family on SNF offers as they will need to select SNF and SNF will then need to initiate Insurance Auth on a weekday.  Elissa Hefty, LCSW Clinical Social Worker [phone]

## 2017-05-06 NOTE — Progress Notes (Signed)
Subjective: 3 Days Post-Op Procedure(s) (LRB): OPEN REDUCTION INTERNAL FIXATION (ORIF) BIMALLEOLAR  ANKLE FRACTURE (Left)   Patient reports pain as mild.  Tolerating POs well.  Admits to BM.  Denies fever, chills, N/V, CP, SOB.  Resting comfortably in bed.  Objective:   VITALS:  Temp:  [97.3 F (36.3 C)-98.2 F (36.8 C)] 98 F (36.7 C) (04/12 0500) Pulse Rate:  [62-80] 80 (04/12 0500) Resp:  [16-20] 20 (04/12 0500) BP: (142-189)/(72-93) 189/93 (04/12 0500) SpO2:  [96 %-100 %] 96 % (04/12 0500)  General: WDWN patient in NAD. Psych:  Appropriate mood and affect. Neuro:  A&O x 3, Moving all extremities, sensation intact to light touch HEENT:  EOMs intact Chest:  Even non-labored respirations Skin:  SLS C/D/I, no rashes or lesions Extremities: warm/dry, mild edema, erythema or echymosis.  No lymphadenopathy. Pulses: Popliteus 2+ MSK:  ROM: EHL/FHL intact, MMT: able to perform quad set    LABS Recent Labs    05/03/17 1039  HGB 11.4*  WBC 3.3*  PLT 228   Recent Labs    05/03/17 1039  NA 141  K 4.2  CL 109  CO2 24  BUN 16  CREATININE 1.16  GLUCOSE 83   No results for input(s): LABPT, INR in the last 72 hours.   Assessment/Plan: 3 Days Post-Op Procedure(s) (LRB): OPEN REDUCTION INTERNAL FIXATION (ORIF) BIMALLEOLAR  ANKLE FRACTURE (Left)  NWB L LE D/C to SNF when bed available Plan for 2 week outpatient post-op visit with Dr. Doran Durand Tylenol for pain control upon D/C ASA for DVT prophylaxis upon D/C Scripts on chart.  Mechele Claude PA-C [facility] Office:  [phone]

## 2017-05-06 NOTE — NC FL2 (Signed)
[place] MEDICAID FL2 LEVEL OF CARE SCREENING TOOL     IDENTIFICATION  Patient Name: Derek Carr Birthdate: [birth year] Sex: male Admission Date (Current Location): 05/03/2017  [facility] and [place] Number:  Herbalist and Address:  [facility], [place] [address], [place], [place] [address]      Provider Number: [id number]  Attending Physician Name and Address:  Wylene Simmer, MD  Relative Name and Phone Number:  Berle Mull, daughter, [phone]    Current Level of Care: Hospital Recommended Level of Care: [place] Prior Approval Number:    Date Approved/Denied:   PASRR Number: [id number]  Discharge Plan: SNF    Current Diagnoses: Patient Active Problem List   Diagnosis Date Noted  . Post-operative pain   . Dementia without behavioral disturbance   . TIA (transient ischemic attack)   . Reactive hypertension   . Acute blood loss anemia   . Bimalleolar fracture of left ankle 05/03/2017  . Axonal neuropathy 03/21/2017  . Right sided weakness 03/01/2017  . Cigarette smoker 06/16/2016  . Dyspnea on exertion 06/15/2016  . Alzheimer disease 01/07/2014  . CSA (central sleep apnea) 10/05/2013  . Syncope 06/26/2013  . COPD GOLD II 06/25/2013  . BPH (benign prostatic hyperplasia) 05/18/2013  . Glaucoma 05/18/2013  . S/P right TH revision 05/14/2013  . Constipation 03/26/2013  . Osteoporosis 03/26/2013  . Expected blood loss anemia 03/23/2013  . S/P right THA, AA 03/20/2013  . Hyperlipidemia   . Erectile dysfunction   . History of asbestos exposure   . Nocturia   . History of shingles   . Dermatitis, atopic   . OA (osteoarthritis) of hip   . Arthritis   . Frequency of urination   . Lumbar scoliosis   . Malignant neoplasm of prostate ([place]) 06/07/2011  . 84 year old gentleman with stage T3 adenocarcinoma prostate with Gleason score 4+4 and PSA of 10.3 05/11/2011    Orientation RESPIRATION BLADDER Height &  Weight     Self, Time, Situation, Place  Normal Continent Weight: 182 lb (82.6 kg) Height:  6\' 3"  (190.5 cm)  BEHAVIORAL SYMPTOMS/MOOD NEUROLOGICAL BOWEL NUTRITION STATUS      Continent Diet(See Dc Summary)  AMBULATORY STATUS COMMUNICATION OF NEEDS Skin   Extensive Assist Verbally Surgical wounds                       Personal Care Assistance Level of Assistance  Bathing, Feeding, Dressing Bathing Assistance: Maximum assistance Feeding assistance: Limited assistance Dressing Assistance: Maximum assistance     Functional Limitations Info  Speech, Hearing, Sight Sight Info: Adequate Hearing Info: Adequate Speech Info: Adequate    SPECIAL CARE FACTORS FREQUENCY  PT (By licensed PT), OT (By licensed OT)     PT Frequency: 5x week OT Frequency: 5x week            Contractures      Additional Factors Info  Code Status, Allergies Code Status Info: Full Allergies Info: DILAUDID HYDROMORPHONE HCL, METHOCARBAMOL, PERCODAN OXYCODONE-ASPIRIN, TRAMADOL, VICODIN HYDROCODONE-ACETAMINOPHEN            Current Medications (05/06/2017):  This is the current hospital active medication list Current Facility-Administered Medications  Medication Dose Route Frequency Provider Last Rate Last Dose  . 0.9 %  sodium chloride infusion   Intravenous Continuous Corky Sing, [place] 75 mL/hr at 05/03/17 1846    . celecoxib (CELEBREX) capsule 200 mg  200 mg Oral BID Corky Sing, [place]  200 mg at 05/06/17 0837  . cholecalciferol (VITAMIN D) tablet 1,000 Units  1,000 Units Oral Daily Corky Sing, PA-C   1,000 Units at 05/06/17 5329  . diphenhydrAMINE (BENADRYL) 12.5 MG/5ML elixir 12.5-25 mg  12.5-25 mg Oral Q4H PRN Corky Sing, PA-C      . docusate sodium (COLACE) capsule 100 mg  100 mg Oral BID Corky Sing, PA-C   100 mg at 05/06/17 9242  . donepezil (ARICEPT) tablet 5 mg  5 mg Oral QHS Corky Sing, PA-C   5 mg at 05/05/17 2154  . enoxaparin (LOVENOX)  injection 40 mg  40 mg Subcutaneous Q24H Corky Sing, PA-C   40 mg at 05/06/17 6834  . ezetimibe (ZETIA) tablet 10 mg  10 mg Oral q morning - 10a Corky Sing, PA-C   10 mg at 05/06/17 1962  . folic acid (FOLVITE) tablet 1 mg  1 mg Oral Daily Corky Sing, PA-C   1 mg at 05/06/17 2297  . irbesartan (AVAPRO) tablet 37.5 mg  37.5 mg Oral Daily Corky Sing, PA-C   37.5 mg at 05/06/17 9892  . ondansetron (ZOFRAN) tablet 4 mg  4 mg Oral Q6H PRN Corky Sing, PA-C       Or  . ondansetron [facility]) injection 4 mg  4 mg Intravenous Q6H PRN Corky Sing, PA-C      . pravastatin (PRAVACHOL) tablet 80 mg  80 mg Oral QPM Corky Sing, PA-C   80 mg at 05/05/17 1629  . senna (SENOKOT) tablet 8.6 mg  1 tablet Oral BID Corky Sing, PA-C   8.6 mg at 05/06/17 1194  . traMADol (ULTRAM) tablet 50 mg  50 mg Oral Q6H PRN Corky Sing, PA-C   50 mg at 05/05/17 2153  . valACYclovir (VALTREX) tablet 1,000 mg  1,000 mg Oral Daily Corky Sing, PA-C   1,000 mg at 05/06/17 1740  . vitamin B-12 (CYANOCOBALAMIN) tablet 1,000 mcg  1,000 mcg Oral Daily Corky Sing, PA-C   1,000 mcg at 05/06/17 8144     Discharge Medications: Please see discharge summary for a list of discharge medications.  Relevant Imaging Results:  Relevant Lab Results:   Additional Information SS#: [id number]  Normajean Baxter, LCSW

## 2017-05-07 NOTE — Plan of Care (Signed)
Problem: Coping: Goal: Level of anxiety will decrease Outcome: Progressing   Pt does not appear to be anxious at this time. Will continue to monitor.

## 2017-05-07 NOTE — Clinical Social Work Note (Signed)
CSW left bed offers at bedside with pt and pt's family. Family request that CSW follow up tomorrow.   [place], [place]

## 2017-05-07 NOTE — Progress Notes (Signed)
Derek Carr  MRN: [id number] DOB/Age: [birth year] 84 y.o. Physician: Ander Slade, M.D. 4 Days Post-Op Procedure(s) (LRB): OPEN REDUCTION INTERNAL FIXATION (ORIF) BIMALLEOLAR  ANKLE FRACTURE (Left)  Subjective: Sitting at bedside this am with daughter in attendance, she reports mental status improved after d/c tramadol. No c/o this am Vital Signs Temp:  [97.5 F (36.4 C)-98 F (36.7 C)] 97.5 F (36.4 C) (04/13 0507) Pulse Rate:  [60-64] 60 (04/13 0507) Resp:  [16-18] 16 (04/13 0507) BP: (134-164)/(77-79) 164/78 (04/13 0507) SpO2:  [99 %-100 %] 100 % (04/13 0507)  Lab Results No results for input(s): WBC, HGB, HCT, PLT in the last 72 hours. BMET No results for input(s): NA, K, CL, CO2, GLUCOSE, BUN, CREATININE, CALCIUM in the last 72 hours. INR  Date Value Ref Range Status  03/01/2017 1.04  Final    Comment:    Performed at [facility], [place] [address], [place], [place] [address]     Exam  Swelling decreased LLE, splint intact  Plan SW facilitating  D/c plan Mirabel Ahlgren M Maxon Kresse 05/07/2017, 8:59 AM    Contact # [phone]

## 2017-05-07 NOTE — Progress Notes (Signed)
Pt's family requested all 4 side rails to be up when pt is by himself without family present in the room to ensure pt safety. RN explained to family that 4x4 rails is considered a restraint, but family is aware still requested all 4 rails to be up. Will continue to monitor.

## 2017-05-08 MED ORDER — METOPROLOL TARTRATE 25 MG PO TABS
25.0000 mg | ORAL_TABLET | Freq: Once | ORAL | Status: AC
  Administered 2017-05-08: 25 mg via ORAL
  Filled 2017-05-08: qty 1

## 2017-05-08 NOTE — Clinical Social Work Note (Signed)
CSW spoke with pt's daughter at bedside. Pt's daughter's concern is if pt would really benefit from going to a SNF with no being able to bear weight. Pt's daughter would much rather pt go home with hh. CSW spoke with PT. PT to determine if pt would be safe going home. CSW to follow up.   [place], [place]

## 2017-05-08 NOTE — Progress Notes (Signed)
   Subjective: 5 Days Post-Op Procedure(s) (LRB): OPEN REDUCTION INTERNAL FIXATION (ORIF) BIMALLEOLAR  ANKLE FRACTURE (Left) Patient reports pain as mild.   Patient seen in rounds for Dr. Doran Durand. Patient is well, and has had no acute complaints or problems. No issues overnight. Daughter in room during rounds.    Objective: Vital signs in last 24 hours: Temp:  [97.7 F (36.5 C)-97.9 F (36.6 C)] 97.8 F (36.6 C) (04/14 1950) Pulse Rate:  [54-68] 61 (04/14 0613) Resp:  [16-18] 16 (04/14 0613) BP: (147-176)/(67-93) 176/93 (04/14 0613) SpO2:  [97 %-99 %] 97 % (04/14 0613)  Intake/Output from previous day:  Intake/Output Summary (Last 24 hours) at 05/08/2017 0739 Last data filed at 05/08/2017 9326 Gross per 24 hour  Intake -  Output 700 ml  Net -700 ml     EXAM General - Patient is Alert and Oriented Extremity - Neurologically intact Sensation intact distally  Able to move toes without difficulty Dressing/Incision - clean, dry, cast in good condition   Past Medical History:  Diagnosis Date  . Anemia   . Ankle fracture    left  . Atherosclerosis   . Complication of anesthesia    CONFUSION - Pt's family very concerned about this  . DDD (degenerative disc disease)   . Degenerative arthritis   . Dermatitis, atopic ARMS AND LEGS  . Diverticulosis   . Erectile dysfunction   . Frequency of urination   . Glaucoma   . History of asbestos exposure   . History of radiation therapy 8/19-13-10/18/11   prostate, 80 GY  . History of shingles 2012-- BILATERAL EYES--  NO RESIDUAL  . Hyperlipidemia   . Hypertension   . Inguinal hernia   . Lumbar scoliosis   . Nocturia   . OA (osteoarthritis) of hip RIGHT  . Prostate cancer ([place]) 05/11/2011   bx=Adenocarcinoma,gleason3+4=7, 4+4=8,PSA=10.30volume=45.7cc  . Renal cyst    bilateral  . Smokers' cough ([place])   . Thyroid cyst     Assessment/Plan: 5 Days Post-Op Procedure(s) (LRB): OPEN REDUCTION INTERNAL FIXATION (ORIF)  BIMALLEOLAR  ANKLE FRACTURE (Left) Active Problems:   Bimalleolar fracture of left ankle   Post-operative pain   Dementia without behavioral disturbance   TIA (transient ischemic attack)   Reactive hypertension   Acute blood loss anemia  Estimated body mass index is 22.75 kg/m as calculated from the following:   Height as of this encounter: 6\' 3"  (1.905 m).   Weight as of this encounter: 82.6 kg (182 lb). Advance diet Up with therapy  DVT Prophylaxis - Lovenox NWB left LE  Mr. Palmero doing well today. Minimal discomfort. Family has some concerns about disposition. Would prefer that if patient can only transfer and not ambulate, for patient to go home with family. Will discuss with PT and case worker today. Possible DC home vs SNF tomorrow.  Ardeen Jourdain, PA-C Orthopaedic Surgery 05/08/2017, 7:39 AM

## 2017-05-08 NOTE — Progress Notes (Signed)
Physical Therapy Treatment Patient Details Name: Derek Carr MRN: [id number] DOB: 1931-03-31 Today's Date: 05/08/2017    History of Present Illness 84yo male who sustained a L ankle fracture 2 weeks ago after tripping/falling over a rock, the fracture site was initially stable but then became displaced/unstable. He received surgery for L ankle ORIF on 05/03/17. PMH DDD, OA, HTN, hernia, prostate CA, cardiac cath, lumbar surgery, R THA with hx of revision     PT Comments    Today's therapy session focused on discussion with pt and daughter regarding discharge planning. Per family, pt would like to return home with his families support as they feel pt will do much better at home. Due to NWB status, discussed the possibility of using a sliding board for transfers in order to reduce weight bearing from bedm <> WC. Family and pt are in agreement to attempt this transfer with therapy at next session. Family would like to take pt home and pursue rehab if pt requires it after he is able to weight bear.     Follow Up Recommendations  Home health PT;Supervision for mobility/OOB     Equipment Recommendations  Wheelchair (measurements PT);Wheelchair cushion (measurements PT);Other (comment)(sliding board)    Recommendations for Other Services       Precautions / Restrictions Precautions Precautions: Fall Restrictions Weight Bearing Restrictions: Yes LLE Weight Bearing: Non weight bearing    Mobility  Bed Mobility               General bed mobility comments: deferred this session, pt just return to bed  Transfers                 General transfer comment: deferred this session  Ambulation/Gait             General Gait Details: unable to attempt this session   Stairs             Wheelchair Mobility    Modified Rankin (Stroke Patients Only)       Balance                                            Cognition Arousal/Alertness:  Awake/alert Behavior During Therapy: WFL for tasks assessed/performed Overall Cognitive Status: Within Functional Limits for tasks assessed                                        Exercises General Exercises - Lower Extremity Ankle Circles/Pumps: AROM;Right;10 reps Quad Sets: AROM;Left;5 reps;Supine    General Comments        Pertinent Vitals/Pain Pain Assessment: 0-10 Pain Score: 0-No pain    Home Living                      Prior Function            PT Goals (current goals can now be found in the care plan section) Acute Rehab PT Goals Patient Stated Goal: to go home with his family PT Goal Formulation: With patient/family Time For Goal Achievement: 05/18/17 Potential to Achieve Goals: Good Progress towards PT goals: Progressing toward goals    Frequency    Min 3X/week      PT Plan Discharge plan needs to be updated    Co-evaluation  AM-PAC PT "6 Clicks" Daily Activity  Outcome Measure  Difficulty turning over in bed (including adjusting bedclothes, sheets and blankets)?: Unable Difficulty moving from lying on back to sitting on the side of the bed? : Unable Difficulty sitting down on and standing up from a chair with arms (e.g., wheelchair, bedside commode, etc,.)?: Unable Help needed moving to and from a bed to chair (including a wheelchair)?: A Lot Help needed walking in hospital room?: Total Help needed climbing 3-5 steps with a railing? : Total 6 Click Score: 7    End of Session   Activity Tolerance: Patient tolerated treatment well Patient left: in bed;with call bell/phone within reach;with bed alarm set;with family/visitor present Nurse Communication: Mobility status PT Visit Diagnosis: Unsteadiness on feet (R26.81);Muscle weakness (generalized) (M62.81);History of falling (Z91.81);Difficulty in walking, not elsewhere classified (R26.2)     Time: 4975-3005 PT Time Calculation (min) (ACUTE ONLY): 17  min  Charges:  $Therapeutic Activity: 8-22 mins                    G Codes:       Scheryl Marten PT, DPT      Adena Sima Sloan Leiter 05/08/2017, 1:39 PM

## 2017-05-09 MED ORDER — LABETALOL HCL 5 MG/ML IV SOLN
5.0000 mg | Freq: Once | INTRAVENOUS | Status: AC
  Administered 2017-05-09: 5 mg via INTRAVENOUS
  Filled 2017-05-09: qty 4

## 2017-05-09 NOTE — Progress Notes (Addendum)
Physical Therapy Treatment Patient Details Name: Derek Carr MRN: [id number] DOB: [birth year] Today's Date: 05/09/2017    History of Present Illness 84yo male who sustained a L ankle fracture 2 weeks ago after tripping/falling over a rock, the fracture site was initially stable but then became displaced/unstable. He received surgery for L ankle ORIF on 05/03/17. PMH DDD, OA, HTN, hernia, prostate CA, cardiac cath, lumbar surgery, R THA with hx of revision     PT Comments    Pt performed transfer training with slide board and posterior/anterior scoot to achieve OOB to toilet and WC.  Daughter and wife present and observed and participated in transfer activities.  Plan for return home this pm.  Informed CM that patient will need a WC with removable or swing away arm rests to achieve transfers at home.      Follow Up Recommendations  Home health PT;Supervision for mobility/OOB     Equipment Recommendations  Wheelchair (measurements PT);Wheelchair cushion (measurements PT);Other (comment)(sliding board. )    Recommendations for Other Services Rehab consult     Precautions / Restrictions Precautions Precautions: Fall Restrictions Weight Bearing Restrictions: Yes LLE Weight Bearing: Non weight bearing    Mobility  Bed Mobility Overal bed mobility: Needs Assistance Bed Mobility: Supine to Sit;Sit to Supine     Supine to sit: Min assist Sit to supine: Min assist   General bed mobility comments: Pt required assistance to move LE to edge of bed and lift LLE to maintain weight bearing restrictions.    Transfers Overall transfer level: Needs assistance Equipment used: (slide board ) Transfers: Anterior-Posterior Transfer;Lateral/Scoot Transfers       Anterior-Posterior transfers: Min assist;+2 safety/equipment(+2 top stabilize the BSC.  Pt required min assist to lift LLE to maintain weight bearing.  )  Lateral/Scoot Transfers: Min assist;+2 safety/equipment;With slide board(Pt  required cues for sliding board placement and set up,  performed with assistance to lift LLE to maintain weight bearing.  ) General transfer comment: Pt required education, VCs and demonstration for correct use and placement of slideboard. Pt's wife and daughter present to observe.  PTA performed 1st trial and family performed second trial for carryover.  Pt required cues for safety and weight bearing restriction.    Ambulation/Gait             General Gait Details: unable to attempt this session   Stairs             Wheelchair Mobility    Modified Rankin (Stroke Patients Only)       Balance     Sitting balance-Leahy Scale: Fair       Standing balance-Leahy Scale: Poor                              Cognition Arousal/Alertness: Awake/alert Behavior During Therapy: WFL for tasks assessed/performed Overall Cognitive Status: Within Functional Limits for tasks assessed                                        Exercises      General Comments        Pertinent Vitals/Pain Pain Assessment: Faces Faces Pain Scale: Hurts a little bit Pain Descriptors / Indicators: Discomfort Pain Intervention(s): Monitored during session;Repositioned    Home Living  Prior Function            PT Goals (current goals can now be found in the care plan section) Acute Rehab PT Goals Patient Stated Goal: to go home with his family Potential to Achieve Goals: Good Progress towards PT goals: Progressing toward goals    Frequency    Min 3X/week      PT Plan Discharge plan needs to be updated    Co-evaluation              AM-PAC PT "6 Clicks" Daily Activity  Outcome Measure  Difficulty turning over in bed (including adjusting bedclothes, sheets and blankets)?: Unable Difficulty moving from lying on back to sitting on the side of the bed? : Unable Difficulty sitting down on and standing up from a chair with  arms (e.g., wheelchair, bedside commode, etc,.)?: Unable Help needed moving to and from a bed to chair (including a wheelchair)?: A Lot Help needed walking in hospital room?: Total Help needed climbing 3-5 steps with a railing? : Total 6 Click Score: 7    End of Session Equipment Utilized During Treatment: Gait belt Activity Tolerance: Patient tolerated treatment well Patient left: in bed;with call bell/phone within reach;with bed alarm set;with family/visitor present Nurse Communication: Mobility status PT Visit Diagnosis: Unsteadiness on feet (R26.81);Muscle weakness (generalized) (M62.81);History of falling (Z91.81);Difficulty in walking, not elsewhere classified (R26.2)     Time: 2035-5974 PT Time Calculation (min) (ACUTE ONLY): 30 min  Charges:  $Therapeutic Activity: 23-37 mins                    G CodesGovernor Rooks, PTA pager [phone]    Cristela Blue 05/09/2017, 12:39 PM

## 2017-05-09 NOTE — Plan of Care (Signed)
  Problem: Education: Goal: Knowledge of General Education information will improve 05/09/2017 1146 by Rance Muir, RN Outcome: Adequate for Discharge 05/09/2017 1109 by Rance Muir, RN Outcome: Progressing   Problem: Health Behavior/Discharge Planning: Goal: Ability to manage health-related needs will improve 05/09/2017 1146 by Rance Muir, RN Outcome: Adequate for Discharge 05/09/2017 1109 by Rance Muir, RN Outcome: Progressing   Problem: Clinical Measurements: Goal: Ability to maintain clinical measurements within normal limits will improve 05/09/2017 1146 by Rance Muir, RN Outcome: Adequate for Discharge 05/09/2017 1109 by Rance Muir, RN Outcome: Progressing Goal: Will remain free from infection 05/09/2017 1146 by Rance Muir, RN Outcome: Adequate for Discharge 05/09/2017 1109 by Rance Muir, RN Outcome: Progressing Goal: Diagnostic test results will improve 05/09/2017 1146 by Rance Muir, RN Outcome: Adequate for Discharge 05/09/2017 1109 by Rance Muir, RN Outcome: Progressing Goal: Respiratory complications will improve 05/09/2017 1146 by Rance Muir, RN Outcome: Adequate for Discharge 05/09/2017 1109 by Rance Muir, RN Outcome: Progressing Goal: Cardiovascular complication will be avoided 05/09/2017 1146 by Rance Muir, RN Outcome: Adequate for Discharge 05/09/2017 1109 by Rance Muir, RN Outcome: Progressing   Problem: Activity: Goal: Risk for activity intolerance will decrease 05/09/2017 1146 by Rance Muir, RN Outcome: Adequate for Discharge 05/09/2017 1109 by Rance Muir, RN Outcome: Progressing   Problem: Nutrition: Goal: Adequate nutrition will be maintained 05/09/2017 1146 by Rance Muir, RN Outcome: Adequate for Discharge 05/09/2017 1109 by Rance Muir, RN Outcome: Progressing   Problem: Coping: Goal: Level of anxiety will decrease 05/09/2017 1146 by Rance Muir, RN Outcome: Adequate for Discharge 05/09/2017 1109 by Rance Muir, RN Outcome: Progressing   Problem: Elimination: Goal: Will not  experience complications related to bowel motility 05/09/2017 1146 by Rance Muir, RN Outcome: Adequate for Discharge 05/09/2017 1109 by Rance Muir, RN Outcome: Progressing Goal: Will not experience complications related to urinary retention 05/09/2017 1146 by Rance Muir, RN Outcome: Adequate for Discharge 05/09/2017 1109 by Rance Muir, RN Outcome: Progressing   Problem: Pain Managment: Goal: General experience of comfort will improve 05/09/2017 1146 by Rance Muir, RN Outcome: Adequate for Discharge 05/09/2017 1109 by Rance Muir, RN Outcome: Progressing   Problem: Safety: Goal: Ability to remain free from injury will improve 05/09/2017 1146 by Rance Muir, RN Outcome: Adequate for Discharge 05/09/2017 1109 by Rance Muir, RN Outcome: Progressing   Problem: Skin Integrity: Goal: Risk for impaired skin integrity will decrease 05/09/2017 1146 by Rance Muir, RN Outcome: Adequate for Discharge 05/09/2017 1109 by Rance Muir, RN Outcome: Progressing

## 2017-05-09 NOTE — Progress Notes (Signed)
Subjective: 6 Days Post-Op Procedure(s) (LRB): OPEN REDUCTION INTERNAL FIXATION (ORIF) BIMALLEOLAR  ANKLE FRACTURE (Left)  Patient reports pain as mild.  Tolerating POs well.  Admits to BM.  Denies fever, chills, N/V, CP, SOB.  Received call this am from on call PA that patient struggled with asymptomatic HTN.  He was given one time dose of labetalol 5mg .  Patient denies HA, blurred vision, or any change in baseline status.  Supposedly patient's daughter is to meet with case management this morning about D/C status: SNF vs Home with HHPT.  Objective:   VITALS:  Temp:  [97.5 F (36.4 C)-98.3 F (36.8 C)] 97.5 F (36.4 C) (04/15 0605) Pulse Rate:  [55-65] 60 (04/15 0605) Resp:  [18] 18 (04/15 0605) BP: (167-195)/(85-100) 195/100 (04/15 0605) SpO2:  [98 %-100 %] 100 % (04/15 0605)  General: WDWN patient in NAD. Psych:  Appropriate mood and affect. Neuro:  A&O x 3, Moving all extremities, sensation intact to light touch HEENT:  EOMs intact Chest:  Even non-labored respirations Skin:  Dressing/SLS C/D/I, no rashes or lesions Extremities: warm/dry, mild edema, no erythema or echymosis.  No lymphadenopathy. Pulses: Popliteus 2+ MSK:  ROM: EHL/FHL intact, MMT: able to perform quad set    LABS No results for input(s): HGB, WBC, PLT in the last 72 hours. No results for input(s): NA, K, CL, CO2, BUN, CREATININE, GLUCOSE in the last 72 hours. No results for input(s): LABPT, INR in the last 72 hours.   Assessment/Plan: 6 Days Post-Op Procedure(s) (LRB): OPEN REDUCTION INTERNAL FIXATION (ORIF) BIMALLEOLAR  ANKLE FRACTURE (Left)  Discussed patient with Dr. Doran Durand this am. Please inform us of D/C decision. If home with HHPT then patient is advised to f/u with PCP ASAP for asymptomatic HTN. NWB L LE. Plan for 2 week outpatient post-op visit with Dr. Doran Durand. ASA for DVT prophylaxis Tylenol for pain control. Scripts on chart.  Mechele Claude PA-C [facility] Office:  [phone]

## 2017-05-09 NOTE — Discharge Summary (Signed)
Physician Discharge Summary  Patient ID: Derek Carr MRN: [id number] DOB/AGE: 1931/06/05 84 y.o.  Admit date: 05/03/2017 Discharge date: 05/09/2017  Admission Diagnoses: Left ankle bimalleolar fracture; dementia; hx of TIA; reactive HTN; hx of acute blood loss anemia; prostate CA; hyperlipidemia; ED; hx of asbestos exposure; hx of shingles; nocturia; hx of R THR; hx of constipation; osteoporosis; glaucoma; COPD; alzheimers; dyspnea on exertion; R sided weakness  Discharge Diagnoses:  Active Problems:   Bimalleolar fracture of left ankle   Post-operative pain   Dementia without behavioral disturbance   TIA (transient ischemic attack)   Reactive hypertension   Acute blood loss anemia   Discharged Condition: stable  Hospital Course: Patient presented to [facility] for elective ORIF L ankle bimalleolar fracture by Dr. Wylene Simmer.  The patient tolerated the procedure well without any complications.  The patient was then admitted to the hospital.  He worked well with therapy.  Pain well controlled on tylenol and tramadol.  During stay the patient was found to have asymptomatic HTN.  He was given labetalol.  The patient is to be D/C'd home with daughter with HHPT.  The patient is instructed to f/u with PCP ASAP for evaluation of asymptomatic HTN.  Consults: PT/OT, case management, social work  Significant Diagnostic Studies: radiology: X-Ray: to ensure satisfactory anatomic alignment during operative procedure.  Treatments: IV hydration, antibiotics: Ancef, analgesia: acetaminophen and tramadol, cardiac meds: metoprolol and labetolol, anticoagulation: lovenox and surgery: as stated above  Discharge Exam: Blood pressure (!) 195/100, pulse 60, temperature (!) 97.5 F (36.4 C), temperature source Oral, resp. rate 18, height 6\' 3"  (1.905 m), weight 82.6 kg (182 lb), SpO2 100 %. General: WDWN patient in NAD. Psych:  Appropriate mood and affect. Neuro:  A&O x 3, Moving all extremities,  sensation intact to light touch HEENT:  EOMs intact Chest:  Even non-labored respirations Skin: SLS/dressing C/D/I, no rashes or lesions Extremities: warm/dry, mild edema, no erythema or echymosis.  No lymphadenopathy. Pulses: Popliteus 2+ MSK:  ROM: EHL/FHL intact, MMT: able to perform quad set   Disposition: Discharge disposition: 01-Home or Self Care       Discharge Instructions    Call MD / Call 911   Complete by:  As directed    If you experience chest pain or shortness of breath, CALL 911 and be transported to the hospital emergency room.  If you develope a fever above 101 F, pus (white drainage) or increased drainage or redness at the wound, or calf pain, call your surgeon's office.   Constipation Prevention   Complete by:  As directed    Drink plenty of fluids.  Prune juice may be helpful.  You may use a stool softener, such as Colace (over the counter) 100 mg twice a day.  Use MiraLax (over the counter) for constipation as needed.   Diet - low sodium heart healthy   Complete by:  As directed    Increase activity slowly as tolerated   Complete by:  As directed    Non weight bearing   Complete by:  As directed    Laterality:  left   Extremity:  Lower     Allergies as of 05/09/2017      Reactions   Dilaudid [hydromorphone Hcl] Other (See Comments)   Confusion   Methocarbamol Other (See Comments)   Drowsiness   Percodan [oxycodone-aspirin] Other (See Comments)   Confusion   Tramadol Other (See Comments)   Drowsiness   Vicodin [hydrocodone-acetaminophen] Other (See Comments)  Confusion      Medication List    TAKE these medications   acetaminophen 500 MG tablet Commonly known as:  TYLENOL Take 1,000 mg by mouth 2 (two) times daily as needed (for pain).   aspirin EC 81 MG tablet Take 1 tablet (81 mg total) by mouth 2 (two) times daily. What changed:  when to take this   Calcium Carbonate Antacid 1177 MG Chew Chew 1 tablet by mouth daily.   cetirizine 10  MG tablet Commonly known as:  ZYRTEC Take 10 mg by mouth daily.   DHA OMEGA 3 PO Take 1,000 mg by mouth daily.   docusate sodium 100 MG capsule Commonly known as:  COLACE Take 1 capsule (100 mg total) by mouth 2 (two) times daily. While taking narcotic pain medicine.   donepezil 10 MG tablet Commonly known as:  ARICEPT Take 1 tablet (10 mg total) by mouth at bedtime. Start 1/2 tablet at bedtime x 4 weeks then 1 tablet at night   ezetimibe 10 MG tablet Commonly known as:  ZETIA Take 10 mg by mouth every morning.   folic acid 1 MG tablet Commonly known as:  FOLVITE Take 1 mg by mouth daily.   irbesartan 75 MG tablet Commonly known as:  AVAPRO Take 37.5 mg by mouth daily. **REPLACES VALSARTAN**   mirabegron ER 50 MG Tb24 tablet Commonly known as:  MYRBETRIQ Take 50 mg by mouth daily.   MOVE FREE JOINT HEALTH ADVANCE PO Take 1 tablet by mouth daily.   pravastatin 80 MG tablet Commonly known as:  PRAVACHOL Take 80 mg by mouth every evening.   PROBIOTIC PO Take 1 capsule by mouth daily.   senna 8.6 MG Tabs tablet Commonly known as:  SENOKOT Take 2 tablets (17.2 mg total) by mouth 2 (two) times daily.   Ubiquinol 100 MG Caps Take 100 mg by mouth daily.   valACYclovir 1000 MG tablet Commonly known as:  VALTREX Take 1,000 mg by mouth daily.   Vitamin B12 1000 MCG Tbcr Take 1 tablet by mouth daily.   Vitamin D-3 1000 units Caps Take 1 capsule by mouth daily.            Durable Medical Equipment  (From admission, onward)        Start     Ordered   05/09/17 1015  For home use only DME 3 n 1  Once     05/09/17 1015   05/09/17 1015  For home use only DME Other see comment  Once    Comments:  Slide( transfer) board   05/09/17 1015       Discharge Care Instructions  (From admission, onward)        Start     Ordered   05/09/17 0000  Non weight bearing    Question Answer Comment  Laterality left   Extremity Lower      05/09/17 1025      Follow-up Information    Wylene Simmer, MD. Schedule an appointment as soon as possible for a visit in 2 weeks.   Specialty:  Orthopedic Surgery Contact information: [address] [place] [address] [phone]           Signed: Mohammed Kindle Office:  [phone]

## 2017-05-09 NOTE — Plan of Care (Signed)

## 2017-05-09 NOTE — Care Management Note (Addendum)
Case Management Note  Patient Details  Name: Derek Carr MRN: [id number] Date of Birth: 1931/12/21  Subjective/Objective:  84 yr old gentleman s/p Orif of Left ankle fracture.                Action/Plan: Case manager spoke with patient and his daughter Derek Carr. Intially plan was for patient to go to shortterm rehab at [facility]. This morning they have decided that home will be best option. Choice for Home Health Agency was offered. Referral was called to Neoma Laming, [place] care Liaison. Patient will have family support at discharge. They have RW and Wheelchair at Home.    Expected Discharge Date:  05/09/17               Expected Discharge Plan:  [place]  In-House Referral:  Clinical Social Work, NA  Discharge planning Services  CM Consult  Post Acute Care Choice:  Durable Medical Equipment, Home Health Choice offered to:  Patient, Adult Children  DME Arranged:  3-N-1, Other see comment(slide board) wheelchair DME Agency:  [place]:  PT, OT [place] Agency:     Status of Service:  Completed, signed off  If discussed at [place] of Stay Meetings, dates discussed:    Additional Comments:  Ninfa Meeker, RN 05/09/2017, 10:31 AM

## 2017-05-09 NOTE — Progress Notes (Addendum)
Occupational Therapy Treatment Patient Details Name: Derek Carr MRN: [id number] DOB: 1931/12/12 Today's Date: 05/09/2017    History of present illness 84yo male who sustained a L ankle fracture 2 weeks ago after tripping/falling over a rock, the fracture site was initially stable but then became displaced/unstable. He received surgery for L ankle ORIF on 05/03/17. PMH DDD, OA, HTN, hernia, prostate CA, cardiac cath, lumbar surgery, R THA with hx of revision    OT comments  Pt progressing towards OT goals; family present during session and educating both pt/pt's family on safety and compensatory techniques for completing ADLs including use of lateral leans for LB ADLs. Pt demonstrating lateral leans seated EOB with MinGuard for safety, completing bed mobility from flat bed in anticipation for mobility completion at home, pt completing with MinGuard throughout. Pt's family wishing to take pt home (vs SNF), d/c rec's have been updated to reflect. Pt's family report feeling comfortable assisting pt with functional transfers and ADL completion at home. Pt anticipating d/c home later today.    Follow Up Recommendations  Home health OT;Supervision/Assistance - 24 hour    Equipment Recommendations  3 in 1 bedside commode          Precautions / Restrictions Precautions Precautions: Fall Restrictions Weight Bearing Restrictions: Yes LLE Weight Bearing: Non weight bearing       Mobility Bed Mobility Overal bed mobility: Needs Assistance Bed Mobility: Supine to Sit     Supine to sit: Min guard Sit to supine: Min assist   General bed mobility comments: completed from flat bed for simulated home environment; pt able to advance LEs over EOB and transition into sitting with Minguard for safety, increased time to complete   Transfers Overall transfer level: Needs assistance Equipment used: (slide board ) Transfers: Anterior-Posterior Transfer;Lateral/Scoot Transfers        Anterior-Posterior transfers: Min assist;+2 safety/equipment(+2 top stabilize the BSC.  Pt required min assist to lift LLE to maintain weight bearing.  )  Lateral/Scoot Transfers: Min assist;+2 safety/equipment;With slide board(Pt required cues for sliding board placement and set up,  performed with assistance to lift LLE to maintain weight bearing.  ) General transfer comment: Pt required education, VCs and demonstration for correct use and placement of slideboard. Pt's wife and daughter present to observe.  PTA performed 1st trial and family performed second trial for carryover.  Pt required cues for safety and weight bearing restriction.      Balance Overall balance assessment: Needs assistance   Sitting balance-Leahy Scale: Good Sitting balance - Comments: static sitting EOB      Standing balance-Leahy Scale: Poor                             ADL either performed or assessed with clinical judgement   ADL Overall ADL's : Needs assistance/impaired Eating/Feeding: Sitting;Set up                                     General ADL Comments: pt's family present and educating pt/pt family on techniques for safe ADL completion including use of lateral leans for peri-care and for LB clothing management; pt demonstrating leaning to L/R while seated EOB with ability to reach buttocks with leaning in each direction                Cognition Arousal/Alertness: Awake/alert Behavior During Therapy: [facility] for tasks  assessed/performed Overall Cognitive Status: Within Functional Limits for tasks assessed                                                            Pertinent Vitals/ Pain       Pain Assessment: Faces Faces Pain Scale: Hurts a little bit Pain Location: LLE Pain Descriptors / Indicators: Discomfort Pain Intervention(s): Monitored during session;Repositioned                                                           Frequency  Min 2X/week        Progress Toward Goals  OT Goals(current goals can now be found in the care plan section)  Progress towards OT goals: Progressing toward goals  Acute Rehab OT Goals Patient Stated Goal: to go home with his family (hopeful for home today) OT Goal Formulation: With patient Time For Goal Achievement: 05/18/17 Potential to Achieve Goals: Good  Plan Discharge plan needs to be updated                     AM-PAC PT "6 Clicks" Daily Activity     Outcome Measure   Help from another person eating meals?: None Help from another person taking care of personal grooming?: A Little Help from another person toileting, which includes using toliet, bedpan, or urinal?: A Lot Help from another person bathing (including washing, rinsing, drying)?: A Lot Help from another person to put on and taking off regular upper body clothing?: None Help from another person to put on and taking off regular lower body clothing?: A Lot 6 Click Score: 17    End of Session    OT Visit Diagnosis: Other abnormalities of gait and mobility (R26.89)   Activity Tolerance Patient tolerated treatment well   Patient Left in bed;with family/visitor present;with call bell/phone within reach;Other (comment)(seated EOB to each lunch )   Nurse Communication Mobility status        Time: 6283-6629 OT Time Calculation (min): 13 min  Charges: OT General Charges $OT Visit: 1 Visit OT Treatments $Self Care/Home Management : 8-22 mins  Lou Cal, OT Pager [phone] 05/09/2017    [name] 05/09/2017, 1:06 PM

## 2017-05-09 NOTE — Progress Notes (Signed)
    Durable Medical Equipment  (From admission, onward)        Start     Ordered   05/09/17 1154  For home use only DME lightweight manual wheelchair with seat cushion  Once    Comments:  Patient suffers from left ankle fracture which impairs their ability to perform daily activities like ambulating n the home.  A cane will not resolve  issue with performing activities of daily living. A wheelchair will allow patient to safely perform daily activities. Patient is not able to propel themselves in the home using a standard weight wheelchair due to generalized weakness, Patient can self propel in the lightweight wheelchair.  Accessories: elevating leg rests (ELRs), wheel locks, extensions and anti-tippers.   05/09/17 1155   05/09/17 1015  For home use only DME 3 n 1  Once     05/09/17 1015   05/09/17 1015  For home use only DME Other see comment  Once    Comments:  Slide( transfer) board   05/09/17 Ferdinand, PTA pager [phone]

## 2017-05-10 ENCOUNTER — Ambulatory Visit: Payer: Medicare HMO | Admitting: Physical Therapy

## 2017-05-10 ENCOUNTER — Encounter: Payer: Medicare HMO | Admitting: Occupational Therapy

## 2017-05-12 ENCOUNTER — Encounter: Payer: Medicare HMO | Admitting: Occupational Therapy

## 2017-05-12 ENCOUNTER — Ambulatory Visit: Payer: Medicare HMO

## 2017-05-13 DIAGNOSIS — N400 Enlarged prostate without lower urinary tract symptoms: Secondary | ICD-10-CM | POA: Diagnosis not present

## 2017-05-13 DIAGNOSIS — E785 Hyperlipidemia, unspecified: Secondary | ICD-10-CM | POA: Diagnosis not present

## 2017-05-13 DIAGNOSIS — I10 Essential (primary) hypertension: Secondary | ICD-10-CM | POA: Diagnosis not present

## 2017-05-13 DIAGNOSIS — G309 Alzheimer's disease, unspecified: Secondary | ICD-10-CM | POA: Diagnosis not present

## 2017-05-13 DIAGNOSIS — M810 Age-related osteoporosis without current pathological fracture: Secondary | ICD-10-CM | POA: Diagnosis not present

## 2017-05-13 DIAGNOSIS — S82842D Displaced bimalleolar fracture of left lower leg, subsequent encounter for closed fracture with routine healing: Secondary | ICD-10-CM | POA: Diagnosis not present

## 2017-05-13 DIAGNOSIS — C61 Malignant neoplasm of prostate: Secondary | ICD-10-CM | POA: Diagnosis not present

## 2017-05-13 DIAGNOSIS — J449 Chronic obstructive pulmonary disease, unspecified: Secondary | ICD-10-CM | POA: Diagnosis not present

## 2017-05-13 DIAGNOSIS — D62 Acute posthemorrhagic anemia: Secondary | ICD-10-CM | POA: Diagnosis not present

## 2017-05-13 DIAGNOSIS — R69 Illness, unspecified: Secondary | ICD-10-CM | POA: Diagnosis not present

## 2017-05-16 ENCOUNTER — Encounter: Payer: Medicare HMO | Admitting: Occupational Therapy

## 2017-05-16 ENCOUNTER — Ambulatory Visit: Payer: Medicare HMO | Admitting: Physical Therapy

## 2017-05-17 ENCOUNTER — Encounter: Payer: Medicare HMO | Admitting: Occupational Therapy

## 2017-05-17 ENCOUNTER — Ambulatory Visit: Payer: Medicare HMO | Admitting: Physical Therapy

## 2017-05-19 DIAGNOSIS — S82842A Displaced bimalleolar fracture of left lower leg, initial encounter for closed fracture: Secondary | ICD-10-CM | POA: Diagnosis not present

## 2017-05-19 DIAGNOSIS — Z4789 Encounter for other orthopedic aftercare: Secondary | ICD-10-CM | POA: Diagnosis not present

## 2017-05-19 DIAGNOSIS — M25572 Pain in left ankle and joints of left foot: Secondary | ICD-10-CM | POA: Diagnosis not present

## 2017-05-30 DIAGNOSIS — E7849 Other hyperlipidemia: Secondary | ICD-10-CM | POA: Diagnosis not present

## 2017-05-30 DIAGNOSIS — M810 Age-related osteoporosis without current pathological fracture: Secondary | ICD-10-CM | POA: Diagnosis not present

## 2017-05-30 DIAGNOSIS — C61 Malignant neoplasm of prostate: Secondary | ICD-10-CM | POA: Diagnosis not present

## 2017-05-30 DIAGNOSIS — G458 Other transient cerebral ischemic attacks and related syndromes: Secondary | ICD-10-CM | POA: Diagnosis not present

## 2017-05-30 DIAGNOSIS — I10 Essential (primary) hypertension: Secondary | ICD-10-CM | POA: Diagnosis not present

## 2017-05-30 DIAGNOSIS — J449 Chronic obstructive pulmonary disease, unspecified: Secondary | ICD-10-CM | POA: Diagnosis not present

## 2017-06-08 DIAGNOSIS — Z471 Aftercare following joint replacement surgery: Secondary | ICD-10-CM | POA: Diagnosis not present

## 2017-06-08 DIAGNOSIS — G309 Alzheimer's disease, unspecified: Secondary | ICD-10-CM | POA: Diagnosis not present

## 2017-06-08 DIAGNOSIS — S82842A Displaced bimalleolar fracture of left lower leg, initial encounter for closed fracture: Secondary | ICD-10-CM | POA: Diagnosis not present

## 2017-06-08 DIAGNOSIS — R351 Nocturia: Secondary | ICD-10-CM | POA: Diagnosis not present

## 2017-06-08 DIAGNOSIS — D62 Acute posthemorrhagic anemia: Secondary | ICD-10-CM | POA: Diagnosis not present

## 2017-06-08 DIAGNOSIS — S82842D Displaced bimalleolar fracture of left lower leg, subsequent encounter for closed fracture with routine healing: Secondary | ICD-10-CM | POA: Diagnosis not present

## 2017-06-08 DIAGNOSIS — R0602 Shortness of breath: Secondary | ICD-10-CM | POA: Diagnosis not present

## 2017-06-08 DIAGNOSIS — J449 Chronic obstructive pulmonary disease, unspecified: Secondary | ICD-10-CM | POA: Diagnosis not present

## 2017-06-08 DIAGNOSIS — R69 Illness, unspecified: Secondary | ICD-10-CM | POA: Diagnosis not present

## 2017-06-08 DIAGNOSIS — E785 Hyperlipidemia, unspecified: Secondary | ICD-10-CM | POA: Diagnosis not present

## 2017-06-08 DIAGNOSIS — I10 Essential (primary) hypertension: Secondary | ICD-10-CM | POA: Diagnosis not present

## 2017-06-08 DIAGNOSIS — R531 Weakness: Secondary | ICD-10-CM | POA: Diagnosis not present

## 2017-06-08 DIAGNOSIS — C61 Malignant neoplasm of prostate: Secondary | ICD-10-CM | POA: Diagnosis not present

## 2017-06-08 DIAGNOSIS — N400 Enlarged prostate without lower urinary tract symptoms: Secondary | ICD-10-CM | POA: Diagnosis not present

## 2017-06-08 DIAGNOSIS — G4731 Primary central sleep apnea: Secondary | ICD-10-CM | POA: Diagnosis not present

## 2017-06-08 DIAGNOSIS — M810 Age-related osteoporosis without current pathological fracture: Secondary | ICD-10-CM | POA: Diagnosis not present

## 2017-06-08 DIAGNOSIS — G8918 Other acute postprocedural pain: Secondary | ICD-10-CM | POA: Diagnosis not present

## 2017-06-15 DIAGNOSIS — Z4789 Encounter for other orthopedic aftercare: Secondary | ICD-10-CM | POA: Diagnosis not present

## 2017-06-15 DIAGNOSIS — S82841D Displaced bimalleolar fracture of right lower leg, subsequent encounter for closed fracture with routine healing: Secondary | ICD-10-CM | POA: Diagnosis not present

## 2017-06-17 NOTE — Progress Notes (Signed)
[facility] [address] [place]. [place] [address] [phone]       OFFICE FOLLOW-UP NOTE  [name] Date of Birth:  12-21-1931 Medical Record Number:  [id number]   HPI: Derek Carr is a 84 year old African-American gentleman who is accompanied today by his daughter. History is obtained from them as well as review of electronic medical records. I have personally reviewed imaging films.Derek Carr is an 84 y.o. male with hypertension and hyperlipidemia.  Per patient approximately 7:00 in the morning on 03/01/2017 patient went to get a right cataract surgery.  He did not stop his aspirin or Plavix for this per patient.  He then took a nap at approximately 1300 hrs. patient awoke at 6 PM and daughter called EMSfor right-sided weakness, facial droop, dysarthria.  Daughter states that he was confused but patient states that he was having a difficult time understanding him which would be more of a receptive aphasia.    By the time EMS arrived (approximately 10 minutes ) all these symptoms had resolved the exception of the right upper extremity weakness--which also quickly resolved.  Patient is noted to have a TIA back in December 2018 (as noted below ) and was placed on Plavix along with his aspirin by his primary care physician.  Patient was not seen in the hospital at that time.Per daughter patient has had one further event prior to this.  At that time they noticed that he had a left facial droop along with leaning to the left.  He did not have any dysarthria at that time.  The symptoms lasted very quickly for approximately 10 minutes. Date last known well: Date: 03/01/2017.Time last known well: Time: 13:00.tPA Given: No: Minimal symptoms.NIH stroke scale at this point is 0.Modified Rankin: Rankin Score=0 MRI scan of the brain showed no acute infarct. LDL cholesterol 50 mg percent. Albumin A1c was 6. Echocardiogram showed no cardiac source of embolism and cardiac ultrasound  was unremarkable. Patient's daughter described that for the past 2 years she had noticed slowing of patient's mentation, speech, ambulation and some swallowing difficulties. He had been evaluated in the past by ENT and by neurologist Dr. Jennings Carr at [facility]. He had EMG nerve conduction study which showed sensorimotor neuropathy. Patient has not been started on dementia medications. Is currently living with his wife. He is independent but daughter has noticed cognitive decline recently. Patient ambulatory with a cane but his balance seems to be poor. He does also have sleep apnea but is having trouble using his CPAP mask. The daughter noticed excessive daytime sleepiness and he can fall asleep even in the middle of conversations. He has not seen Dr. Brett Carr recently for these issues. The patient denies any significant weight loss, muscle fasciculations, muscle wasting or weakness. He is currently on dual antiplatelet therapy aspirin and Plavix daughter get well without bruising or bleeding.  06/21/17 UPDATE: Patient returns today for follow-up visit.  At previous appointment it was recommended to start Aricept but due to multiple side effects from other medications such as dizziness, Aricept was not started.  Daughter states that his memory and concentration are both stable as they have not gotten worse.  Daughter continues to live with patient for assistance with some ADLs and IADLs.  Continues to take aspirin without side effects of bleeding or bruising.  Was recently taken off cholesterol medication due to possible side effects.  Recent EEG was normal and negative for seizure activity.  PT/OT continues to come  to the house.  He is using a wheelchair for long distance but is able to walk with a rolling walker for short distance.  Blood pressure today satisfactory 142/82.  He has been compliant with CPAP machine and follows Dr. Brett Carr for sleep apnea.  He did recently fractured his ankle on the left  side as he was trying to step out of the truck.  Currently using boot.  Denies new or worsening stroke/TIA symptoms.   ROS:   14 system review of systems is positive for activity change, shortness of breath, cold intolerance, daytime sleepiness, environmental allergies, incontinence of bladder, frequency of urination, urgency, joint pain, joint swelling, walking difficulty, rash, memory loss, numbness, speech difficulty, and decreased concentration and all other systems negative  PMH:  Past Medical History:  Diagnosis Date  . Anemia   . Ankle fracture    left  . Atherosclerosis   . Complication of anesthesia    CONFUSION - Pt's family very concerned about this  . DDD (degenerative disc disease)   . Degenerative arthritis   . Dermatitis, atopic ARMS AND LEGS  . Diverticulosis   . Erectile dysfunction   . Frequency of urination   . Glaucoma   . History of asbestos exposure   . History of radiation therapy 8/19-13-10/18/11   prostate, 19 GY  . History of shingles 2012-- BILATERAL EYES--  NO RESIDUAL  . Hyperlipidemia   . Hypertension   . Inguinal hernia   . Lumbar scoliosis   . Nocturia   . OA (osteoarthritis) of hip RIGHT  . Prostate cancer ([place]) 05/11/2011   bx=Adenocarcinoma,gleason3+4=7, 4+4=8,PSA=10.30volume=45.7cc  . Renal cyst    bilateral  . Smokers' cough ([place])   . Stroke ([place])   . Thyroid cyst     Social History:  Social History   Socioeconomic History  . Marital status: Married    Spouse name: Derek Carr  . Number of children: 4  . Years of education: College  . Highest education level: Not on file  Occupational History  . Occupation:       Employer: [employer]    Comment: Retired  Scientific laboratory technician  . Financial resource strain: Not on file  . Food insecurity:    Worry: Not on file    Inability: Not on file  . Transportation needs:    Medical: Not on file    Non-medical: Not on file  Tobacco Use  . Smoking status: Former Smoker    Packs/day: 0.50     Years: 62.00    Pack years: 31.00    Types: Cigarettes  . Smokeless tobacco: Never Used  . Tobacco comment: smoke one or two per day  Substance and Sexual Activity  . Alcohol use: No    Alcohol/week: 0.6 oz    Types: 1 Cans of beer per week  . Drug use: No    Comment: quit smoking 02/2012  . Sexual activity: Not on file  Lifestyle  . Physical activity:    Days per week: Not on file    Minutes per session: Not on file  . Stress: Not on file  Relationships  . Social connections:    Talks on phone: Not on file    Gets together: Not on file    Attends religious service: Not on file    Active member of club or organization: Not on file    Attends meetings of clubs or organizations: Not on file    Relationship status: Not on file  . Intimate  partner violence:    Fear of current or ex partner: Not on file    Emotionally abused: Not on file    Physically abused: Not on file    Forced sexual activity: Not on file  Other Topics Concern  . Not on file  Social History Narrative   Patient is Married Designer, television/film set), and lives at home with his wife 43 years   Patient has 3 daughters and 1 son.   Patient has a college education.   Patient is right-handed.   Patient drinks one cup of coffee, 1-2 cups of tea daily and rarely drinks soda.   Retired from [employer]    Medications:   Current Outpatient Medications on File Prior to Visit  Medication Sig Dispense Refill  . acetaminophen (TYLENOL) 500 MG tablet Take 1,000 mg by mouth 2 (two) times daily as needed (for pain).     Marland Kitchen aspirin EC 81 MG tablet Take 1 tablet (81 mg total) by mouth 2 (two) times daily. (Patient taking differently: Take 81 mg by mouth daily. ) 84 tablet 0  . Calcium Carbonate Antacid 1177 MG CHEW Chew 1 tablet by mouth daily.     . cetirizine (ZYRTEC) 10 MG tablet Take 10 mg by mouth daily.    . Cholecalciferol (VITAMIN D-3) 1000 units CAPS Take 1 capsule by mouth 2 (two) times daily.     . Cyanocobalamin (VITAMIN B12) 1000  MCG TBCR Take 1 tablet by mouth 2 (two) times daily.     . Docosahexaenoic Acid (DHA OMEGA 3 PO) Take 1,000 mg by mouth daily.     Marland Kitchen docusate sodium (COLACE) 100 MG capsule Take 1 capsule (100 mg total) by mouth 2 (two) times daily. While taking narcotic pain medicine. 30 capsule 0  . folic acid (FOLVITE) 1 MG tablet Take 1 mg by mouth daily.    . Glucos-Chond-Hyal Ac-Ca Fructo (MOVE FREE JOINT HEALTH ADVANCE PO) Take 1 tablet by mouth daily.    . irbesartan (AVAPRO) 75 MG tablet Take 37.5 mg by mouth daily. **REPLACES VALSARTAN**  11  . Probiotic Product (PROBIOTIC PO) Take 1 capsule by mouth daily.     Marland Kitchen senna (SENOKOT) 8.6 MG TABS tablet Take 2 tablets (17.2 mg total) by mouth 2 (two) times daily. 30 each 0  . Ubiquinol 100 MG CAPS Take 100 mg by mouth daily.    . valACYclovir (VALTREX) 1000 MG tablet Take 1,000 mg by mouth daily.     Marland Kitchen donepezil (ARICEPT) 10 MG tablet Take 1 tablet (10 mg total) by mouth at bedtime. Start 1/2 tablet at bedtime x 4 weeks then 1 tablet at night (Patient not taking: Reported on 06/21/2017) 30 tablet 3   No current facility-administered medications on file prior to visit.     Allergies:   Allergies  Allergen Reactions  . Oxycodone Other (See Comments)  . Dilaudid [Hydromorphone Hcl] Other (See Comments)    Confusion   . Methocarbamol Other (See Comments)    Drowsiness   . Percodan [Oxycodone-Aspirin] Other (See Comments)    Confusion   . Tramadol Other (See Comments)    Drowsiness   . Vicodin [Hydrocodone-Acetaminophen] Other (See Comments)    Confusion     Physical Exam General: well developed, pleasant elderly African-American male, well nourished, seated, in no evident distress Head: head normocephalic and atraumatic.  Neck: supple with no carotid or supraclavicular bruits Cardiovascular: regular rate and rhythm, no murmurs Musculoskeletal: no deformity Skin:  no rash/petichiae Vascular:  Normal pulses all  extremities Vitals:   06/21/17  1036  BP: (!) 142/84  Pulse: 63   Neurologic Exam Mental Status: Awake and fully alert. Remote memory poor Attention span, concentration and fund of knowledge diminished Mood and affect appropriate. Recall 0/3.  AFT 5.  Mini-Mental status exam scored 18/30 (previously 20/30).   Cranial Nerves: Fundoscopic exam reveals sharp disc margins. Pupils equal, briskly reactive to light. Extraocular movements full without nystagmus. Visual fields full to confrontation. Hearing intact. Facial sensation intact. Face, tongue, palate moves normally and symmetrically. No  tongue atrophy or fasciculations noted. Motor: Normal bulk and tone. Normal strength in all tested extremity muscles. No fasciculations or muscle wasting noted. Sensory.: Diminished touch ,pinprick .position and vibratory sensation from ankle down.  Coordination: Rapid alternating movements normal in all extremities. Finger-to-nose and heel-to-shin performed accurately bilaterally. Gait and Station: Patient currently sitting in wheelchair and needs assistance from a rolling walker for ambulation which was not present at appointment.  Unable to test gait. Reflexes: 1+ and symmetric except ankle jerks are depressed bilaterally. Toes downgoing.     ASSESSMENT: 84 year old African-American gentleman with recent episode of TIA from small vessel disease in February 2019 with suspected underlying mild dementia as well as peripheral neuropathy.  Returns today for follow-up appointment.    PLAN: -Continue aspirin 81 mg daily for secondary stroke prevention (did not start statin due to recent discontinuation due to possible side effects.  As numerous medications were stopped at once, it was undetermined which medication was causing these reactions.  Her PCP, hold off on restarting statin at this time. -Continue to follow up with PCP regarding HLD and HTN management  -f/u Dr. Brett Carr 07/07/17 -Daughter would like to hold off on starting Aricept at  this time due to stabilization and not worsening -Maintain strict control of hypertension with blood pressure goal below 130/90, diabetes with hemoglobin A1c goal below 6.5% and cholesterol with LDL cholesterol (bad cholesterol) goal below 70 mg/dL. I also advised the patient to eat a healthy diet with plenty of whole grains, cereals, fruits and vegetables, exercise regularly and maintain ideal body weight.  Followup in the future with me in 6 months or call earlier if needed  Greater than 50% of time during this 25 minute visit was spent on counseling,explanation of diagnosis of TIA, reviewing risk factor management of HLD, HTN and memory loss, planning of further management, discussion with patient and family and coordination of care  Venancio Poisson, [facility]  [facility] [address] [place], [place] [address]  Phone [phone] Fax [phone]

## 2017-06-21 ENCOUNTER — Ambulatory Visit (INDEPENDENT_AMBULATORY_CARE_PROVIDER_SITE_OTHER): Payer: Medicare HMO | Admitting: Adult Health

## 2017-06-21 ENCOUNTER — Encounter: Payer: Self-pay | Admitting: Adult Health

## 2017-06-21 VITALS — BP 142/84 | HR 63 | Ht 75.0 in | Wt 179.4 lb

## 2017-06-21 DIAGNOSIS — E785 Hyperlipidemia, unspecified: Secondary | ICD-10-CM | POA: Diagnosis not present

## 2017-06-21 DIAGNOSIS — R69 Illness, unspecified: Secondary | ICD-10-CM | POA: Diagnosis not present

## 2017-06-21 DIAGNOSIS — G4731 Primary central sleep apnea: Secondary | ICD-10-CM

## 2017-06-21 DIAGNOSIS — I10 Essential (primary) hypertension: Secondary | ICD-10-CM | POA: Diagnosis not present

## 2017-06-21 DIAGNOSIS — G459 Transient cerebral ischemic attack, unspecified: Secondary | ICD-10-CM | POA: Diagnosis not present

## 2017-06-21 DIAGNOSIS — F0390 Unspecified dementia without behavioral disturbance: Secondary | ICD-10-CM | POA: Diagnosis not present

## 2017-06-21 NOTE — Patient Instructions (Addendum)
Continue aspirin 81 mg daily for secondary stroke prevention  Follow up with Dr. Brett Fairy June 13th for sleep  Continue to follow up with PCP regarding cholesterol and blood pressure management   Continue to monitor blood pressure at home  Continue with therapies - physical and occupational   Maintain strict control of hypertension with blood pressure goal below 130/90, diabetes with hemoglobin A1c goal below 6.5% and cholesterol with LDL cholesterol (bad cholesterol) goal below 70 mg/dL. I also advised the patient to eat a healthy diet with plenty of whole grains, cereals, fruits and vegetables, exercise regularly and maintain ideal body weight.  Followup in the future with me in 6 months or call earlier if needed         Thank you for coming to see [place] at [facility]. I hope we have been able to provide you high quality care today.  You may receive a patient satisfaction survey over the next few weeks. We would appreciate your feedback and comments so that we may continue to improve ourselves and the health of our patients. a

## 2017-06-28 NOTE — Progress Notes (Signed)
I agree with the above plan 

## 2017-07-07 ENCOUNTER — Ambulatory Visit (INDEPENDENT_AMBULATORY_CARE_PROVIDER_SITE_OTHER): Payer: Medicare HMO | Admitting: Neurology

## 2017-07-07 ENCOUNTER — Encounter: Payer: Self-pay | Admitting: Neurology

## 2017-07-07 ENCOUNTER — Encounter

## 2017-07-07 VITALS — BP 142/86 | HR 59 | Ht 75.0 in | Wt 180.0 lb

## 2017-07-07 DIAGNOSIS — I639 Cerebral infarction, unspecified: Secondary | ICD-10-CM

## 2017-07-07 DIAGNOSIS — R69 Illness, unspecified: Secondary | ICD-10-CM | POA: Diagnosis not present

## 2017-07-07 DIAGNOSIS — F0150 Vascular dementia without behavioral disturbance: Secondary | ICD-10-CM

## 2017-07-07 DIAGNOSIS — Z8673 Personal history of transient ischemic attack (TIA), and cerebral infarction without residual deficits: Secondary | ICD-10-CM | POA: Diagnosis not present

## 2017-07-07 DIAGNOSIS — G4710 Hypersomnia, unspecified: Secondary | ICD-10-CM

## 2017-07-07 DIAGNOSIS — R351 Nocturia: Secondary | ICD-10-CM | POA: Diagnosis not present

## 2017-07-07 NOTE — Progress Notes (Signed)
SLEEP MEDICINE CLINIC   Provider:  [name], M D  Primary Care Physician:  [name], MD   Referring Provider: Antony Contras, MD and Rudean Curt Schaick.NP     Chief Complaint  Patient presents with  . Follow-up    rm 10. pt with daughter, pt states he uses the machine still. DME [facility]. he feels that it is working fine. they brought there card but when processing through on airview it states no data. the daughter states they recently had it downloaded. I have contacted  [facility] to have them investigate.     HPI:   07-07-2017.  Derek Carr is a 84 y.o. male , seen here as in a referral from [place] ,  After repeated TIA -STROKE team patient , last seen by Dory Peru, NP .  Patient does  Have a diagnosis of  sleep apnea but is having increasingly  trouble using his CPAP mask. The daughter noticed excessive daytime sleepiness and he can fall asleep even in the middle of conversations. Recommended Rv with me for CPAP follow up- . The patient denies any significant weight loss, muscle fasciculations, muscle wasting or weakness. He is currently on dual antiplatelet therapy aspirin and Plavix daughter get well without bruising or bleeding. The patient was unable to use his CPAP for the last year, according to his daughter.  He has a very high fall risk, he urinates frequently at night and he has to rise from the bed to relieve himself.  She felt that the CPAP was an additional endurance something he struggled with at night to take off and put back on. During the office follow-up with Janett Billow from 21 Jun 2017 she did an extensive medical records review the stroke service admissions before.  She also stated that in her previous appointment MD had recommended to start Aricept -but given his dizziness and tendency to have bradycardia Aricept was not started and should not be started.   Memory and concentration have been relatively stable but there is evidence of vascular dementia, he  continues to take his aspirin without bleeding or bruising he does not have gum bleeding.  No nosebleeds.  EEG was normal and negative for seizure activity.  PT OT continues to come to the house for the first month, after he suffered a fracture to his left ankle.  He is currently using a boot.  There have been no worsening stroke symptoms or TIA symptoms since his last hospitalization in February.      03-21-2017 : Dr Leonie Man, primary neurologist  HPI: Derek Carr is a 84 year old African-American gentleman who is accompanied today by his daughter. History is obtained from them as well as review of electronic medical records. I have personally reviewed imaging films.RHODERICK Carr is an 84 y.o. male with hypertension and hyperlipidemia.    Per patient approximately 7:00 in the morning on 03/01/2017 patient went to get a right cataract surgery.  He did not stop his aspirin or Plavix for this per patient.  He then took a nap at approximately 1300 hrs. patient awoke at 6 PM and daughter called EMSfor right-sided weakness, facial droop, dysarthria.  Daughter states that he was confused but patient states that he was having a difficult time understanding him which would be more of a receptive aphasia.    By the time EMS arrived (approximately 10 minutes ) all these symptoms had resolved the exception of the right upper extremity weakness--which also quickly resolved.  Patient is noted to have a TIA back in December 2018 (as noted below ) and was placed on Plavix along with his aspirin by his primary care physician.  Patient was not seen in the hospital at that time.Per daughter patient has had one further event prior to this.  At that time they noticed that he had a left facial droop along with leaning to the left.  He did not have any dysarthria at that time. The symptoms lasted very quickly for approximately 10 minutes.  Date last known well: Date: 03/01/2017.Time last known well: Time: 13:00.tPA Given: No:  Minimal symptoms.NIH stroke scale at this point is 0.Modified Rankin: Rankin Score=0 MRI scan of the brain showed no acute infarct. LDL cholesterol 50 mg percent. Albumin A1c was 6. Echocardiogram showed no cardiac source of embolism and cardiac ultrasound was unremarkable. Patient's daughter described that for the past 2 years she had noticed slowing of patient's mentation, speech, ambulation and some swallowing difficulties. He had been evaluated in the past by ENT and by neurologist Dr. Jennings Books at [facility]. He had EMG nerve conduction study which showed sensorimotor neuropathy. Patient has not been started on dementia medications. Is currently living with his wife. He is independent but daughter has noticed cognitive decline recently. Patient ambulatory with a cane but his balance seems to be poor.         Chief complaint according to patient :  Sleep habits are as follows: bedtime is around 8. 8.30 PM and asleep promptly, 4-7 bathroom breaks- very frequent. Uses a urinal but needs to stand up, when urinating in a seated position ' flow is slow" . Some urge  incontinence. He denies dreaming frequently , rarely night mares, but he yells out in his sleep , kicks , he is not waking up and has  The patient sleeps in a regular bed. Not adjustable, uses 1 pillow. Sleeps supine since he needed to adjust due to FFM, now still supine without CPAP use.  He rises at 8.30 AM, and averages 8 hours of sleep with 12 hours in bed , and by 10 or 11 he can easily take another naps.    Social history: daughter is main caretaker.   Review of Systems: Out of a complete 14 system review, the patient complains of only the following symptoms, and all other reviewed systems are negative. Derek Carr endorsed 20 points out of 24 possible points in the Epworth Sleepiness Scale  and the fatigue severity scale 47 points,  he does not appear to be depressed he is jovial and outgoing, his geriatric depression  score was endorsed at 4 out of 15 points which is not significant.  REM BD    Social History   Socioeconomic History  . Marital status: Married    Spouse name: Alison Murray  . Number of children: 4  . Years of education: College  . Highest education level: Not on file  Occupational History  . Occupation:       Employer: [employer]    Comment: Retired  Scientific laboratory technician  . Financial resource strain: Not on file  . Food insecurity:    Worry: Not on file    Inability: Not on file  . Transportation needs:    Medical: Not on file    Non-medical: Not on file  Tobacco Use  . Smoking status: Former Smoker    Packs/day: 0.50    Years: 62.00    Pack years: 31.00    Types: Cigarettes  .  Smokeless tobacco: Never Used  . Tobacco comment: smoke one or two per day  Substance and Sexual Activity  . Alcohol use: No    Alcohol/week: 0.6 oz    Types: 1 Cans of beer per week  . Drug use: No    Comment: quit smoking 02/2012  . Sexual activity: Not on file  Lifestyle  . Physical activity:    Days per week: Not on file    Minutes per session: Not on file  . Stress: Not on file  Relationships  . Social connections:    Talks on phone: Not on file    Gets together: Not on file    Attends religious service: Not on file    Active member of club or organization: Not on file    Attends meetings of clubs or organizations: Not on file    Relationship status: Not on file  . Intimate partner violence:    Fear of current or ex partner: Not on file    Emotionally abused: Not on file    Physically abused: Not on file    Forced sexual activity: Not on file  Other Topics Concern  . Not on file  Social History Narrative   Patient is Married Designer, television/film set), and lives at home with his wife 39 years   Patient has 3 daughters and 1 son.   Patient has a college education.   Patient is right-handed.   Patient drinks one cup of coffee, 1-2 cups of tea daily and rarely drinks soda.   Retired from [employer] History  Problem Relation Age of Onset  . Hypertension Mother   . Prostate cancer Brother        surgery/cured  . Lung cancer Brother        new primary/same brother    Past Medical History:  Diagnosis Date  . Anemia   . Ankle fracture    left  . Atherosclerosis   . Complication of anesthesia    CONFUSION - Pt's family very concerned about this  . DDD (degenerative disc disease)   . Degenerative arthritis   . Dermatitis, atopic ARMS AND LEGS  . Diverticulosis   . Erectile dysfunction   . Frequency of urination   . Glaucoma   . History of asbestos exposure   . History of radiation therapy 8/19-13-10/18/11   prostate, 64 GY  . History of shingles 2012-- BILATERAL EYES--  NO RESIDUAL  . Hyperlipidemia   . Hypertension   . Inguinal hernia   . Lumbar scoliosis   . Nocturia   . OA (osteoarthritis) of hip RIGHT  . Prostate cancer ([place]) 05/11/2011   bx=Adenocarcinoma,gleason3+4=7, 4+4=8,PSA=10.30volume=45.7cc  . Renal cyst    bilateral  . Smokers' cough ([employer])   . Stroke ([place])   . Thyroid cyst     Past Surgical History:  Procedure Laterality Date  . ATTEMPTED LEFT VATS/ LEFT THORACOTOMY/ RESECTION OF THE ENORMOUS, PROBABLE BRONCHOGENIC CYST  01-04-2005  DR Arlyce Dice  . CARDIAC CATHETERIZATION  02-24-2009  DR NASHER   MINOR CORONARY ARTERY IRREGULARITIES/ NORMAL LVSF/ EF 65-70%  . CATARACT EXTRACTION    . COLONOSCOPY W/ POLYPECTOMY    . CYSTOSCOPY  11/15/2011   Procedure: CYSTOSCOPY;  Surgeon: Dutch Gray, MD;  Location: [facility];  Service: Urology;  Laterality: N/A;  no seeds seen in bladder  . esophageus cyst removal  YRS AGO  . [place]  . ORIF ANKLE FRACTURE Left 05/03/2017  .  ORIF ANKLE FRACTURE Left 05/03/2017   Procedure: OPEN REDUCTION INTERNAL FIXATION (ORIF) BIMALLEOLAR  ANKLE FRACTURE;  Surgeon: Wylene Simmer, MD;  Location: [facility];  Service: Orthopedics;  Laterality: Left;  . PROSTATE BIOPSY  05/11/11   Adenocarcinoma  (MD OFFICE)  . RADIOACTIVE SEED IMPLANT  11/15/2011   Procedure: RADIOACTIVE SEED IMPLANT;  Surgeon: Dutch Gray, MD;  Location: [facility];  Service: Urology;  Laterality: N/A;  Total number of seeds - 52  . REPAIR RIGHT INGUINAL HERNIA W/ MESH  09-10-1999  . TOTAL HIP ARTHROPLASTY Right 03/20/2013   Procedure: RIGHT TOTAL HIP ARTHROPLASTY ANTERIOR APPROACH;  Surgeon: [name], MD;  Location: [facility];  Service: Orthopedics;  Laterality: Right;  . TOTAL HIP REVISION Right 05/14/2013   Procedure: open reduction internal fixation REVISION RIGHT HIP ;  Surgeon: [name], MD;  Location: [facility];  Service: Orthopedics;  Laterality: Right;  . UMBILICAL HERNIA REPAIR  1998   epigastric    Current Outpatient Medications  Medication Sig Dispense Refill  . acetaminophen (TYLENOL) 500 MG tablet Take 1,000 mg by mouth 2 (two) times daily as needed (for pain).     Marland Kitchen aspirin EC 81 MG tablet Take 1 tablet (81 mg total) by mouth 2 (two) times daily. (Patient taking differently: Take 81 mg by mouth daily. ) 84 tablet 0  . Calcium Carbonate Antacid 1177 MG CHEW Chew 1 tablet by mouth daily.     . cetirizine (ZYRTEC) 10 MG tablet Take 10 mg by mouth daily.    . Cholecalciferol (VITAMIN D-3) 1000 units CAPS Take 1 capsule by mouth 2 (two) times daily.     . Cyanocobalamin (VITAMIN B12) 1000 MCG TBCR Take 1 tablet by mouth 2 (two) times daily.     . Docosahexaenoic Acid (DHA OMEGA 3 PO) Take 1,000 mg by mouth daily.     Marland Kitchen docusate sodium (COLACE) 100 MG capsule Take 1 capsule (100 mg total) by mouth 2 (two) times daily. While taking narcotic pain medicine. 30 capsule 0  . donepezil (ARICEPT) 10 MG tablet Take 1 tablet (10 mg total) by mouth at bedtime. Start 1/2 tablet at bedtime x 4 weeks then 1 tablet at night 30 tablet 3  . folic acid (FOLVITE) 1 MG tablet Take 1 mg by mouth daily.    . irbesartan (AVAPRO) 75 MG tablet Take 37.5 mg by mouth daily. **REPLACES VALSARTAN**  11  . Omega  3-6-9 Fatty Acids (OMEGA DHA PO) Take 1 tablet by mouth daily.    . Probiotic Product (PROBIOTIC PO) Take 1 capsule by mouth daily.     Marland Kitchen senna (SENOKOT) 8.6 MG TABS tablet Take 2 tablets (17.2 mg total) by mouth 2 (two) times daily. 30 each 0  . Ubiquinol 100 MG CAPS Take 100 mg by mouth daily.    . valACYclovir (VALTREX) 1000 MG tablet Take 1,000 mg by mouth daily.      No current facility-administered medications for this visit.     Allergies as of 07/07/2017 - Review Complete 07/07/2017  Allergen Reaction Noted  . Oxycodone Other (See Comments) 06/21/2017  . Dilaudid [hydromorphone hcl] Other (See Comments) 05/07/2013  . Methocarbamol Other (See Comments) 05/07/2013  . Percodan [oxycodone-aspirin] Other (See Comments) 05/07/2013  . Tramadol Other (See Comments) 05/07/2013  . Vicodin [hydrocodone-acetaminophen] Other (See Comments) 05/07/2013    Vitals: BP (!) 142/86   Pulse (!) 59   Ht 6\' 3"  (1.905 m)   Wt 180 lb (81.6 kg)  BMI 22.50 kg/m  Last Weight:  Wt Readings from Last 1 Encounters:  07/07/17 180 lb (81.6 kg)   FIE:PPIR mass index is 22.5 kg/m.     Last Height:   Ht Readings from Last 1 Encounters:  07/07/17 6\' 3"  (1.905 m)    Physical exam:  General: The patient is awake, alert and appears not in acute distress. The patient is well groomed. He had shingles three times, in the face, and affecting the right eye .  Head: Normocephalic, atraumatic. Neck is supple. Mallampati 3,  neck circumference: 17"  Nasal airflow patent -  Retrognathia is mild.  Cardiovascular:  Regular rate and rhythm , without  murmurs or carotid bruit, and without distended neck veins. Respiratory: Lungs are clear to auscultation. Skin:  Without evidence of edema, or rash Trunk: BMI is 22.5 .  The patient's posture is stooped , uses a walker   Neurologic exam : The patient is awake and alert,   MMSE - Mini Mental State Exam 06/21/2017 03/21/2017  Orientation to time 2 4  Orientation to  Place 4 3  Registration 3 3  Attention/ Calculation 0 0  Recall 0 1  Language- name 2 objects 2 2  Language- repeat 1 1  Language- follow 3 step command 3 3  Language- read & follow direction 1 1  Write a sentence 1 1  Copy design 1 1  Total score 18 20      Attention span & concentration ability appears impaired . He is drowsy .  Speech is fluent,  without  dysarthria, but with mild dysphonia and  aphasia.  Mood and affect are appropriate.  Cranial nerves: Pupils are equal and briskly reactive to light. . Extraocular movements  in vertical and horizontal planes intact and without nystagmus. Visual fields by finger perimetry are intact. Hearing to finger rub intact.   Facial sensation intact to fine touch.  Facial motor strength is symmetric and tongue and uvula move midline. Shoulder shrug was symmetrical.   The patient's motor examination shows residual stroke symptoms, he does not have muscle wasting he does not have fasciculations, his left foot remains in a boot, his shoulders are of equal height bilaterally he has normal head turning, both hands show equal strength muscle bulk.  He has a history of loss of sensory from mid calf level down and numbness in the feet to vibration has been present for years.  Coordination is slowed rapid alternating movements are slowed, and finger-to-nose test was not tested today but was described bilaterally accurately in May.  He is not sitting in a wheelchair but walks with an walker.  Ankle jerks were depressed according to his neuropathy, and he has 1+ reflexes.  I agree with his stroke specialist that he should continue on aspirin, I would like his primary care physician to treat him for hypertension and hyperlipidemia, I do think that a Holter monitor would be of benefit given that he had recurrent TIAs that have been idiopathic cryptogenic.  I will order a sleep test for the patient in the order to make it convenient for him to evaluate if sleep  apnea is the main factor behind his excessive daytime sleepiness, I offered a home sleep test.  Usually Medicare is not in favor of sleep test especially not in patients with a subacute stroke, however I think it would be harder for Mr. Colasanti to sleep in the lab.  He has very frequent nocturia and needs assistance and is at  high fall risk.  I would based on the home sleep test results I will order a nasal pillow auto titration CPAP or use his current CPAP and set it for a pressure with 3 cm EPR that should be addressing his current needs.     Assessment:  After physical and neurologic examination, review of laboratory studies,  Personal review of imaging studies, reports of other /same  Imaging studies, results of polysomnography and / or neurophysiology testing and pre-existing records as far as provided in visit., my assessment is   1)  EDS- extreme, with 8 hours of fragmanted sleep and multiple daytime naps, related to vascular dementia ? related to OSA ?   2)  Nocturia 5-7 times ,   3)  Dr. Pernell Dupre for cardiac monitor. Is this patient a loop candidate     The patient was advised of the nature of the diagnosed disorder , the treatment options and the  risks for general health and wellness arising from not treating the condition.   I spent more than 45 minutes of face to face time with the patient.  Greater than 50% of time was spent in counseling and coordination of care. We have discussed the diagnosis and differential and I answered the patient's questions.    Plan:  Treatment plan and additional workup :  HST- screen for current level of apnea.  CPAP owner, needs new supplies. Wants a easy on easy off CPAP interface - try nasal pillow. NP follow up. Dizziness resolved.      [name], MD 1/44/3154, 0:08 PM  Certified in Neurology by ABPN Certified in Sleep Medicine by [facility] [address], [place], [place] [address]    Dr  Leonie Man, Dr Tamala Julian. Dr Joylene Draft.

## 2017-07-09 DIAGNOSIS — G4731 Primary central sleep apnea: Secondary | ICD-10-CM | POA: Diagnosis not present

## 2017-07-09 DIAGNOSIS — R531 Weakness: Secondary | ICD-10-CM | POA: Diagnosis not present

## 2017-07-09 DIAGNOSIS — R0602 Shortness of breath: Secondary | ICD-10-CM | POA: Diagnosis not present

## 2017-07-09 DIAGNOSIS — G8918 Other acute postprocedural pain: Secondary | ICD-10-CM | POA: Diagnosis not present

## 2017-07-09 DIAGNOSIS — S82842A Displaced bimalleolar fracture of left lower leg, initial encounter for closed fracture: Secondary | ICD-10-CM | POA: Diagnosis not present

## 2017-07-09 DIAGNOSIS — R351 Nocturia: Secondary | ICD-10-CM | POA: Diagnosis not present

## 2017-07-09 DIAGNOSIS — Z471 Aftercare following joint replacement surgery: Secondary | ICD-10-CM | POA: Diagnosis not present

## 2017-07-13 DIAGNOSIS — H04123 Dry eye syndrome of bilateral lacrimal glands: Secondary | ICD-10-CM | POA: Diagnosis not present

## 2017-07-13 DIAGNOSIS — H401132 Primary open-angle glaucoma, bilateral, moderate stage: Secondary | ICD-10-CM | POA: Diagnosis not present

## 2017-07-13 DIAGNOSIS — B0233 Zoster keratitis: Secondary | ICD-10-CM | POA: Diagnosis not present

## 2017-07-13 DIAGNOSIS — H169 Unspecified keratitis: Secondary | ICD-10-CM | POA: Diagnosis not present

## 2017-07-14 ENCOUNTER — Telehealth: Payer: Self-pay | Admitting: Neurology

## 2017-07-14 DIAGNOSIS — Z4789 Encounter for other orthopedic aftercare: Secondary | ICD-10-CM | POA: Diagnosis not present

## 2017-07-14 DIAGNOSIS — S82841D Displaced bimalleolar fracture of right lower leg, subsequent encounter for closed fracture with routine healing: Secondary | ICD-10-CM | POA: Diagnosis not present

## 2017-07-14 DIAGNOSIS — M25572 Pain in left ankle and joints of left foot: Secondary | ICD-10-CM | POA: Diagnosis not present

## 2017-07-14 NOTE — Telephone Encounter (Signed)
Pt's daughter [name] called to check on status of cardiac referral discussed at last OV

## 2017-07-15 ENCOUNTER — Other Ambulatory Visit: Payer: Self-pay | Admitting: Neurology

## 2017-07-15 DIAGNOSIS — I639 Cerebral infarction, unspecified: Secondary | ICD-10-CM

## 2017-07-15 DIAGNOSIS — Z8673 Personal history of transient ischemic attack (TIA), and cerebral infarction without residual deficits: Secondary | ICD-10-CM

## 2017-07-15 NOTE — Telephone Encounter (Signed)
Called the patient to inform her that initially we held off on sending a referral for the patient in hopes that Dr Dohmeier had sent a message just questioning the cardiologist and he would respond. Dr Dohmeier stated she never heard back from the cardiologist so a referral has been placed and sent to there office for them to evaluate. Order placed today.  There was no answer. LVM with this information to the daughters cell phone and informed her that hopefully within the week she should hear from someone in their office.

## 2017-07-19 NOTE — Telephone Encounter (Signed)
Called Dr. Thompson Caul office and Lenna Sciara   will call me back with an apt time for Loop recorder .

## 2017-07-19 NOTE — Telephone Encounter (Signed)
Derek Carr called from Dr. Thompson Caul office and she is going to call patient's daughter to schedule apt with Dr. Tamala Julian / Loop re-corder.

## 2017-07-21 ENCOUNTER — Encounter: Payer: Self-pay | Admitting: Internal Medicine

## 2017-07-27 ENCOUNTER — Encounter: Payer: Self-pay | Admitting: Internal Medicine

## 2017-07-27 ENCOUNTER — Ambulatory Visit: Payer: Medicare HMO | Attending: Student | Admitting: Physical Therapy

## 2017-07-27 ENCOUNTER — Encounter: Payer: Self-pay | Admitting: Physical Therapy

## 2017-07-27 ENCOUNTER — Ambulatory Visit (INDEPENDENT_AMBULATORY_CARE_PROVIDER_SITE_OTHER): Payer: Medicare HMO | Admitting: Internal Medicine

## 2017-07-27 VITALS — BP 112/72 | HR 71 | Ht 75.0 in | Wt 180.0 lb

## 2017-07-27 DIAGNOSIS — M25672 Stiffness of left ankle, not elsewhere classified: Secondary | ICD-10-CM | POA: Insufficient documentation

## 2017-07-27 DIAGNOSIS — R42 Dizziness and giddiness: Secondary | ICD-10-CM | POA: Diagnosis not present

## 2017-07-27 DIAGNOSIS — R262 Difficulty in walking, not elsewhere classified: Secondary | ICD-10-CM | POA: Insufficient documentation

## 2017-07-27 DIAGNOSIS — R2689 Other abnormalities of gait and mobility: Secondary | ICD-10-CM

## 2017-07-27 DIAGNOSIS — R278 Other lack of coordination: Secondary | ICD-10-CM | POA: Diagnosis not present

## 2017-07-27 DIAGNOSIS — G459 Transient cerebral ischemic attack, unspecified: Secondary | ICD-10-CM

## 2017-07-27 DIAGNOSIS — M6281 Muscle weakness (generalized): Secondary | ICD-10-CM | POA: Diagnosis not present

## 2017-07-27 DIAGNOSIS — R2681 Unsteadiness on feet: Secondary | ICD-10-CM

## 2017-07-27 NOTE — Progress Notes (Signed)
HPI [name] is referred today for evaluation and management of a cryptogenic stroke. He was in the hospital several weeks ago with a stroke and did not get a TEE or an ILR inserted. He had right sided weakness with his initial presentation in February. Fortunately his symptoms resolved. He has had a total of 3 episodes. He has not had palpitations. He has had an echo which showed diastolic dysfunction and low normal EF. His atrial were ok. Allergies  Allergen Reactions  . Oxycodone Other (See Comments)  . Dilaudid [Hydromorphone Hcl] Other (See Comments)    Confusion   . Methocarbamol Other (See Comments)    Drowsiness   . Percodan [Oxycodone-Aspirin] Other (See Comments)    Confusion   . Tramadol Other (See Comments)    Drowsiness   . Vicodin [Hydrocodone-Acetaminophen] Other (See Comments)    Confusion      Current Outpatient Medications  Medication Sig Dispense Refill  . acetaminophen (TYLENOL) 500 MG tablet Take 1,000 mg by mouth 2 (two) times daily as needed (for pain).     Marland Kitchen aspirin EC 81 MG tablet Take 81 mg by mouth daily.    . Calcium Carbonate Antacid (TUMS PO) Take 1 tablet by mouth daily.    . cetirizine (ZYRTEC) 10 MG tablet Take 10 mg by mouth daily.    . Cholecalciferol (VITAMIN D-3) 1000 units CAPS Take 1 capsule by mouth 2 (two) times daily.     . Cyanocobalamin (VITAMIN B12) 1000 MCG TBCR Take 1 tablet by mouth 2 (two) times daily.     . Docosahexaenoic Acid (DHA OMEGA 3 PO) Take 1,000 mg by mouth daily.     Marland Kitchen docusate sodium (COLACE) 100 MG capsule Take 1 capsule (100 mg total) by mouth 2 (two) times daily. While taking narcotic pain medicine. 30 capsule 0  . folic acid (FOLVITE) 1 MG tablet Take 1 mg by mouth daily.    . irbesartan (AVAPRO) 75 MG tablet Take 37.5 mg by mouth daily. **REPLACES VALSARTAN**  11  . Omega 3-6-9 Fatty Acids (OMEGA DHA PO) Take 1 tablet by mouth daily.    . Probiotic Product (PROBIOTIC PO) Take 1 capsule by mouth daily.      Marland Kitchen senna (SENOKOT) 8.6 MG TABS tablet Take 2 tablets (17.2 mg total) by mouth 2 (two) times daily. 30 each 0  . Ubiquinol (QUNOL COQ10/UBIQUINOL/MEGA) 100 MG CAPS Take 1 tablet by mouth daily.    . valACYclovir (VALTREX) 1000 MG tablet Take 1,000 mg by mouth daily.     No current facility-administered medications for this visit.      Past Medical History:  Diagnosis Date  . Anemia   . Ankle fracture    left  . Atherosclerosis   . Complication of anesthesia    CONFUSION - Pt's family very concerned about this  . DDD (degenerative disc disease)   . Degenerative arthritis   . Dermatitis, atopic ARMS AND LEGS  . Diverticulosis   . Erectile dysfunction   . Frequency of urination   . Glaucoma   . History of asbestos exposure   . History of radiation therapy 8/19-13-10/18/11   prostate, 12 GY  . History of shingles 2012-- BILATERAL EYES--  NO RESIDUAL  . Hyperlipidemia   . Hypertension   . Inguinal hernia   . Lumbar scoliosis   . Nocturia   . OA (osteoarthritis) of hip RIGHT  . Prostate cancer ([place]) 05/11/2011   bx=Adenocarcinoma,gleason3+4=7, 4+4=8,PSA=10.30volume=45.7cc  . Renal  cyst    bilateral  . Smokers' cough ([place])   . Stroke ([place])   . Thyroid cyst     ROS:   All systems reviewed and negative except as noted in the HPI.   Past Surgical History:  Procedure Laterality Date  . ATTEMPTED LEFT VATS/ LEFT THORACOTOMY/ RESECTION OF THE ENORMOUS, PROBABLE BRONCHOGENIC CYST  01-04-2005  DR Arlyce Dice  . CARDIAC CATHETERIZATION  02-24-2009  DR NASHER   MINOR CORONARY ARTERY IRREGULARITIES/ NORMAL LVSF/ EF 65-70%  . CATARACT EXTRACTION    . COLONOSCOPY W/ POLYPECTOMY    . CYSTOSCOPY  11/15/2011   Procedure: CYSTOSCOPY;  Surgeon: Dutch Gray, MD;  Location: [facility];  Service: Urology;  Laterality: N/A;  no seeds seen in bladder  . esophageus cyst removal  YRS AGO  . [place]  . ORIF ANKLE FRACTURE Left 05/03/2017  . ORIF ANKLE FRACTURE  Left 05/03/2017   Procedure: OPEN REDUCTION INTERNAL FIXATION (ORIF) BIMALLEOLAR  ANKLE FRACTURE;  Surgeon: Wylene Simmer, MD;  Location: [facility];  Service: Orthopedics;  Laterality: Left;  . PROSTATE BIOPSY  05/11/11   Adenocarcinoma ([facility])  . RADIOACTIVE SEED IMPLANT  11/15/2011   Procedure: RADIOACTIVE SEED IMPLANT;  Surgeon: Dutch Gray, MD;  Location: [facility];  Service: Urology;  Laterality: N/A;  Total number of seeds - 52  . REPAIR RIGHT INGUINAL HERNIA W/ MESH  09-10-1999  . TOTAL HIP ARTHROPLASTY Right 03/20/2013   Procedure: RIGHT TOTAL HIP ARTHROPLASTY ANTERIOR APPROACH;  Surgeon: [name], MD;  Location: [facility];  Service: Orthopedics;  Laterality: Right;  . TOTAL HIP REVISION Right 05/14/2013   Procedure: open reduction internal fixation REVISION RIGHT HIP ;  Surgeon: [name], MD;  Location: [facility];  Service: Orthopedics;  Laterality: Right;  . UMBILICAL HERNIA REPAIR  1998   epigastric     Family History  Problem Relation Age of Onset  . Hypertension Mother   . Prostate cancer Brother        surgery/cured  . Lung cancer Brother        new primary/same brother     Social History   Socioeconomic History  . Marital status: Married    Spouse name: Derek Carr  . Number of children: 4  . Years of education: College  . Highest education level: Not on file  Occupational History  . Occupation:       Employer: [employer]    Comment: Retired  Scientific laboratory technician  . Financial resource strain: Not on file  . Food insecurity:    Worry: Not on file    Inability: Not on file  . Transportation needs:    Medical: Not on file    Non-medical: Not on file  Tobacco Use  . Smoking status: Former Smoker    Packs/day: 0.50    Years: 62.00    Pack years: 31.00    Types: Cigarettes  . Smokeless tobacco: Never Used  . Tobacco comment: smoke one or two per day  Substance and Sexual Activity  . Alcohol use: No    Alcohol/week: 0.6 oz    Types: 1 Cans of  beer per week  . Drug use: No    Comment: quit smoking 02/2012  . Sexual activity: Not on file  Lifestyle  . Physical activity:    Days per week: Not on file    Minutes per session: Not on file  . Stress: Not on file  Relationships  .  Social connections:    Talks on phone: Not on file    Gets together: Not on file    Attends religious service: Not on file    Active member of club or organization: Not on file    Attends meetings of clubs or organizations: Not on file    Relationship status: Not on file  . Intimate partner violence:    Fear of current or ex partner: Not on file    Emotionally abused: Not on file    Physically abused: Not on file    Forced sexual activity: Not on file  Other Topics Concern  . Not on file  Social History Narrative   Patient is Married Designer, television/film set), and lives at home with his wife 62 years   Patient has 3 daughters and 1 son.   Patient has a college education.   Patient is right-handed.   Patient drinks one cup of coffee, 1-2 cups of tea daily and rarely drinks soda.   Retired from [employer]     BP 112/72   Pulse 71   Ht 6\' 3"  (1.905 m)   Wt 180 lb (81.6 kg)   SpO2 98%   BMI 22.50 kg/m   Physical Exam:  stable appearing 83 NAD HEENT: Unremarkable Neck:  No JVD, no thyromegally Lymphatics:  No adenopathy Back:  No CVA tenderness Lungs:  Clear with no wheezes HEART:  Regular rate rhythm, no murmurs, no rubs, no clicks Abd:  soft, positive bowel sounds, no organomegally, no rebound, no guarding Ext:  2 plus pulses, no edema, no cyanosis, no clubbing Skin:  No rashes no nodules Neuro:  CN II through XII intact, motor grossly intact  EKG - NSR with RBBB and left axis.  DEVICE  Normal device function.  See PaceArt for details.   Assess/Plan: 1. Cryptogenic stroke/TIA - the etiology is unclear and I have recommended that the patient undergo insertion of an ILR. He is considering his options.  2. Conduction system disease - he has not had  syncope. He does have bifasicular block.   Mikle Bosworth.D.

## 2017-07-27 NOTE — Patient Instructions (Addendum)
Medication Instructions:  Your physician recommends that you continue on your current medications as directed. Please refer to the Current Medication list given to you today.  Labwork: None ordered.  Testing/Procedures: Your physician has recommended you have a loop recorder placed to monitor your heart.  The billing codes are:  [phone], [phone], and [phone]  Follow-Up:   The following days are available for procedures:  June 3, 7, 14, 25, 26 July 2, 8, 10, 11, 15, 16, 18, 29 If you decide on a day please give me a call:  Bing Neighbors [phone]    Any Other Special Instructions Will Be Listed Below (If Applicable).  If you need a refill on your cardiac medications before your next appointment, please call your pharmacy.    Implantable Loop Recorder Placement An implantable loop recorder is a small electronic device that is placed under the skin of your chest. It is about the size of an AA ("double A") battery. The device records the electrical activity of your heart over a long period of time. Your health care provider can download these recordings to monitor your heart. You may need an implantable loop recorder if you have periods of abnormal heart activity (arrhythmias) or unexplained fainting (syncope) caused by a heart problem. Tell a health care provider about:  Any allergies you have.  All medicines you are taking, including vitamins, herbs, eye drops, creams, and over-the-counter medicines.  Any problems you or family members have had with anesthetic medicines.  Any blood disorders you have.  Any surgeries you have had.  Any medical conditions you have.  Whether you are pregnant or may be pregnant. What are the risks? Generally, this is a safe procedure. However, as with any procedure, problems may occur, including:  Infection.  Bleeding.  Allergic reactions to anesthetic medicines.  Damage to nerves or blood vessels.  Failure of the device to work. This could  require another surgery to replace it.  What happens before the procedure?   You may have a physical exam, blood tests, and imaging tests of your heart, such as a chest X-ray.  Follow instructions from your health care provider about eating or drinking restrictions.  Ask your health care provider about: ? Changing or stopping your regular medicines. This is especially important if you are taking diabetes medicines or blood thinners. ? Taking medicines such as aspirin and ibuprofen. These medicines can thin your blood. Do not take these medicines before your procedure if your surgeon instructs you not to.  Ask your health care provider how your surgical site will be marked or identified.  You may be given antibiotic medicine to help prevent infection.  Plan to have someone take you home after the procedure.  If you will be going home right after the procedure, plan to have someone with you for 24 hours.  Do not use any tobacco products, such as cigarettes, chewing tobacco, and e-cigarettes as told by your surgeon. If you need help quitting, ask your health care provider. What happens during the procedure?  To reduce your risk of infection: ? Your health care team will wash or sanitize their hands. ? Your skin will be washed with soap.  An IV tube will be inserted into one of your veins.  You may be given an antibiotic medicine through the IV tube.  You may be given one or more of the following: ? A medicine to help you relax (sedative). ? A medicine to numb the area (local anesthetic).  A small cut (incision) will be made on the left side of your upper chest.  A pocket will be created under your skin.  The device will be placed in the pocket.  The incision will be closed with stitches (sutures) or adhesive strips.  A bandage (dressing) will be placed over the incision. The procedure may vary among health care providers and hospitals. What happens after the  procedure?  Your blood pressure, heart rate, breathing rate, and blood oxygen level will be monitored often until the medicines you were given have worn off.  You may be able to go home on the day of your surgery. Before going home: ? Your health care provider will program your recorder. ? You will learn how to trigger your device with a handheld activator. ? You will learn how to send recordings to your health care provider. ? You will get an ID card for your device, and you will be told when to use it.  Do not drive for 24 hours if you received a sedative. This information is not intended to replace advice given to you by your health care provider. Make sure you discuss any questions you have with your health care provider. Document Released: 12/23/2014 Document Revised: 06/19/2015 Document Reviewed: 10/16/2014 Elsevier Interactive Patient Education  [employer].

## 2017-07-28 NOTE — Therapy (Signed)
[facility] [address] [place], [place], [address] Phone: [phone]   Fax:  [phone]  Physical Therapy Evaluation  Patient Details  Name: Derek Carr MRN: [id number] Date of Birth: 1931/06/11 Referring Provider: Alfredo Martinez, PA   Encounter Date: 07/27/2017  PT End of Session - 07/27/17 1850    Visit Number  1    Number of Visits  27    Date for PT Re-Evaluation  10/26/17    Authorization Type  [employer] & Generic commercial 2nd    PT Start Time  1105    PT Stop Time  1142    PT Time Calculation (min)  37 min    Equipment Utilized During Treatment  Gait belt    Activity Tolerance  Patient tolerated treatment well    Behavior During Therapy  WFL for tasks assessed/performed       Past Medical History:  Diagnosis Date  . Anemia   . Ankle fracture    left  . Atherosclerosis   . Complication of anesthesia    CONFUSION - Pt's family very concerned about this  . DDD (degenerative disc disease)   . Degenerative arthritis   . Dermatitis, atopic ARMS AND LEGS  . Diverticulosis   . Erectile dysfunction   . Frequency of urination   . Glaucoma   . History of asbestos exposure   . History of radiation therapy 8/19-13-10/18/11   prostate, 45 GY  . History of shingles 2012-- BILATERAL EYES--  NO RESIDUAL  . Hyperlipidemia   . Hypertension   . Inguinal hernia   . Lumbar scoliosis   . Nocturia   . OA (osteoarthritis) of hip RIGHT  . Prostate cancer (HCC) 05/11/2011   bx=Adenocarcinoma,gleason3+4=7, 4+4=8,PSA=10.30volume=45.7cc  . Renal cyst    bilateral  . Smokers' cough (HCC)   . Stroke (HCC)   . Thyroid cyst     Past Surgical History:  Procedure Laterality Date  . ATTEMPTED LEFT VATS/ LEFT THORACOTOMY/ RESECTION OF THE ENORMOUS, PROBABLE BRONCHOGENIC CYST  01-04-2005  DR Edwyna Shell  . CARDIAC CATHETERIZATION  02-24-2009  DR NASHER   MINOR CORONARY ARTERY IRREGULARITIES/ NORMAL LVSF/ EF 65-70%  .  CATARACT EXTRACTION    . COLONOSCOPY W/ POLYPECTOMY    . CYSTOSCOPY  11/15/2011   Procedure: CYSTOSCOPY;  Surgeon: Crecencio Mc, MD;  Location: [facility];  Service: Urology;  Laterality: N/A;  no seeds seen in bladder  . esophageus cyst removal  YRS AGO  . LUMBAR SPINE SURGERY  1991  . ORIF ANKLE FRACTURE Left 05/03/2017  . ORIF ANKLE FRACTURE Left 05/03/2017   Procedure: OPEN REDUCTION INTERNAL FIXATION (ORIF) BIMALLEOLAR  ANKLE FRACTURE;  Surgeon: Toni Arthurs, MD;  Location: [facility];  Service: Orthopedics;  Laterality: Left;  . PROSTATE BIOPSY  05/11/11   Adenocarcinoma ([facility] OFFICE)  . RADIOACTIVE SEED IMPLANT  11/15/2011   Procedure: RADIOACTIVE SEED IMPLANT;  Surgeon: Crecencio Mc, MD;  Location: [facility];  Service: Urology;  Laterality: N/A;  Total number of seeds - 52  . REPAIR RIGHT INGUINAL HERNIA W/ MESH  09-10-1999  . TOTAL HIP ARTHROPLASTY Right 03/20/2013   Procedure: RIGHT TOTAL HIP ARTHROPLASTY ANTERIOR APPROACH;  Surgeon: Shelda Pal, MD;  Location: [facility];  Service: Orthopedics;  Laterality: Right;  . TOTAL HIP REVISION Right 05/14/2013   Procedure: open reduction internal fixation REVISION RIGHT HIP ;  Surgeon: Shelda Pal, MD;  Location: [facility];  Service: Orthopedics;  Laterality: Right;  .  UMBILICAL HERNIA REPAIR  1998   epigastric    There were no vitals filed for this visit.   Subjective Assessment - 07/27/17 1110    Subjective  This 85yo male was referred on 07/26/2017 to PT by Alfredo Martinez, PA with left ankle Bimalleolar fracture with ORIF on 05/03/2017 . Pt fell on 3/27 when he was stepping out of the truck. He opened the door and stepped on a rock and rolled his ankle and fell sustaining Bimalleolar ankle fracture and underwent ORIF on 05/03/2017. He was non-weight bearing initially and now weight bearing as tolerated. He received Home Health PT after ankle fracture & ORIF. He has history of dementia/Alzheimers, HTN & prostate CA in remission  presented to ED with right side weakness & facial droop. He was hospitalized 03/01/17-03/03/17 with TIA. His daughter reports patient had 4 past TIAs first of which was found incidental on imaging , 2nd in 2017 , 3rd was in 2018, and his 4th was on02/05/2017.  Patient family reports last TIA has not had significant impact on his overall mobility. Patient presents today for PT evaluation to improve mobility & balance.     Patient is accompained by:  Family member    Pertinent History  right ankle fracture, TIAs, DDD, Lumbar scoliosis, Glaucoma, cataract surgery, HTN, right THR 03/20/13 with revision 05/14/13, right femur fracture, dementia/Alzheimers    Limitations  Standing;Walking;House hold activities    Patient Stated Goals  Patient wants to improve his balance & walking    Currently in Pain?  No/denies         [facility] PT Assessment - 07/27/17 1100      Assessment   Medical Diagnosis  left ankle fracture, TIA    Referring Provider  Alfredo Martinez, PA    Onset Date/Surgical Date  05/03/17 ORIF     Hand Dominance  Right    Prior Therapy  PT, Vestibular therapy at ENT practice in [place],  5 visits of PT at [facility] when fx'd anklle      Precautions   Precautions  Fall      Restrictions   Weight Bearing Restrictions  Yes    LLE Weight Bearing  Weight bearing as tolerated      Balance Screen   Has the patient fallen in the past 6 months  Yes    How many times?  1 rolled ankle stepping out of truck stepped on rock    Has the patient had a decrease in activity level because of a fear of falling?   Yes    Is the patient reluctant to leave their home because of a fear of falling?   Yes      Home Environment   Living Environment  Private residence    Living Arrangements  Spouse/significant other    Type of Home  House grass walkway to back door/ main entrance    Home Access  Stairs to enter;Ramped entrance ramp added to back door    Entrance Stairs-Number of Steps  4    Entrance  Stairs-Rails  Right;Left;Can reach both    Home Layout  One level    Home Equipment  Gurley - single point;Walker - 2 wheels;Shower seat;Bedside commode;Wheelchair - manual;Grab bars - tub/shower      Prior Function   Level of Independence  Independent with basic ADLs;Independent with household mobility without device;Independent with community mobility with device;Independent with homemaking with ambulation      Cognition   Overall Cognitive Status  History of  cognitive impairments - at baseline      Posture/Postural Control   Posture/Postural Control  Postural limitations    Postural Limitations  Rounded Shoulders;Forward head;Flexed trunk;Weight shift right      ROM / Strength   AROM / PROM / Strength  PROM;Strength      PROM   Overall PROM   Deficits    PROM Assessment Site  Ankle    Right/Left Ankle  Left    Left Ankle Dorsiflexion  -4    Left Ankle Plantar Flexion  24    Left Ankle Inversion  15    Left Ankle Eversion  0      Strength   Overall Strength  Deficits    Right Hip Flexion  4/5    Right Hip Extension  3+/5 tested standing with UE support    Right Hip ABduction  4-/5 tested standing with UE support    Left Hip Flexion  4/5    Left Hip Extension  3+/5 tested standing with UE support    Left Hip ABduction  3+/5 tested standing with UE support    Right Knee Flexion  4-/5 tested standing with UE support    Right Knee Extension  4/5    Left Knee Flexion  4-/5 tested standing with UE support    Left Knee Extension  4-/5    Right Ankle Dorsiflexion  4/5    Left Ankle Dorsiflexion  4-/5    Left Ankle Plantar Flexion  3-/5    Left Ankle Inversion  3+/5    Left Ankle Eversion  3-/5      Ambulation/Gait   Ambulation/Gait  Yes    Ambulation/Gait Assistance  5: Supervision;4: Min assist supervision RW & MinA cane    Ambulation Distance (Feet)  100 Feet 100' RW & 50' cane    Assistive device  Rolling walker;Straight cane    Gait Pattern  Step-through pattern;Decreased  arm swing - left;Decreased stance time - left;Decreased stride length;Decreased dorsiflexion - left;Decreased weight shift to left;Left hip hike;Left foot flat;Antalgic;Lateral hip instability;Wide base of support    Ambulation Surface  Indoor;Level    Gait velocity  1.80 ft/sec RW & 2.25 ft/sec cane minA    Gait velocity - backwards  MinA with cane for balance      Standardized Balance Assessment   Standardized Balance Assessment  Berg Balance Test;Timed Up and Go Test      Berg Balance Test   Sit to Stand  Able to stand using hands after several tries    Standing Unsupported  Able to stand 30 seconds unsupported    Sitting with Back Unsupported but Feet Supported on Floor or Stool  Able to sit safely and securely 2 minutes    Stand to Sit  Controls descent by using hands    Transfers  Able to transfer safely, definite need of hands    Standing Unsupported with Eyes Closed  Unable to keep eyes closed 3 seconds but stays steady    Standing Ubsupported with Feet Together  Needs help to attain position and unable to hold for 15 seconds    From Standing, Reach Forward with Outstretched Arm  Reaches forward but needs supervision    From Standing Position, Pick up Object from Floor  Able to pick up shoe, needs supervision    From Standing Position, Turn to Look Behind Over each Shoulder  Needs supervision when turning    Turn 360 Degrees  Needs assistance while turning  Standing Unsupported, Alternately Place Feet on Step/Stool  Needs assistance to keep from falling or unable to try    Standing Unsupported, One Foot in [employer] balance while stepping or standing    Standing on One Leg  Unable to try or needs assist to prevent fall    Total Score  20    Berg comment:  high fall risk & dependency with standing ADLs      Timed Up and Go Test   Normal TUG (seconds)  28.8 28.80 sec RW & 22.00 sec cane with minA      RLE Tone   RLE Tone  Within Functional Limits      LLE Tone   LLE Tone   Within Functional Limits                Objective measurements completed on examination: See above findings.                PT Short Term Goals - 07/28/17 1222      PT SHORT TERM GOAL #1   Title  Pt will be independent with initial HEP exercises (All STGs Target Date 08/26/2017)    Time  30    Period  Days    Status  New    Target Date  08/26/17      PT SHORT TERM GOAL #2   Title  Patient ambulates 250' paved surfaces with cane with supervision.     Time  30    Period  Days    Status  New    Target Date  08/26/17      PT SHORT TERM GOAL #3   Title  Timed Up & Go with cane <18sec with supervision.     Time  30    Period  Days    Status  New    Target Date  08/26/17      PT SHORT TERM GOAL #4   Title  Patient standing balance reaching 10" anteriorly, to floor and looks over shoulders without balance loss with supervision.     Time  30    Period  Days    Status  New    Target Date  08/26/17        PT Long Term Goals - 07/28/17 1215      PT LONG TERM GOAL #1   Title  Pt will demonstrate independence with ongoing HEP program and fitness plan (All LTGs Target Date 10/26/2017)    Time  90    Period  Days    Status  New    Target Date  10/26/17      PT LONG TERM GOAL #2   Title  Patient ambulates Modified Independent 500' outdoors including grass, pavement, curbs, ramps with cane to improve community ambulatory skills    Time  90    Period  Days    Status  New    Target Date  10/26/17      PT LONG TERM GOAL #3   Title  Berg Balance >/= 45/56 to indicate lower fall risk.     Time  90    Period  Days    Status  New    Target Date  10/26/17      PT LONG TERM GOAL #4   Title  Timed Up-Go time with cane <13.5 seconds to indicate lower fall risk.     Time  90    Period  Days    Status  New    Target Date  10/26/17             Plan - 07/28/17 1213    Clinical Impression Statement  This 85yo male was undergoing outpatient therapies at  [facility] for TIA (~4th) & stepped out of truck rolling ankle on rock with fracture requiring ORIF. He returns today following HHPT to further improve mobility & balance. He has decreased ROM left ankle and weakness bilateral lower extremities. Patient has impaired balance with high fall risk noted by Sharlene Motts Balance 20/56 and Timed Up & Go 28.80sec RW & 22.00sec with cane. Patient?Ts gait is dependent with rolling walker with supervision and minimal assist with cane with deviations impairing safety. Gait velocity with walker 1.80ft/sec and with cane 2.25 ft/sec. Patient would benefit from skilled care to improve mobility & safety.     History and Personal Factors relevant to plan of care:  Lives with elderly wife, dementia/ Alzheimers, dyspnea on exertion    Clinical Presentation  Evolving    Clinical Presentation due to:  high fall risk, dementia, recent fall with ankle fracture, TIAs    Clinical Decision Making  Moderate    Rehab Potential  Good    PT Frequency  2x / week    PT Duration  Other (comment) 13 weeks (90 days)    PT Treatment/Interventions  Gait training;Functional mobility training;Neuromuscular re-education;Balance training;Therapeutic exercise;Therapeutic activities;Patient/family education;ADLs/Self Care Home Management;Stair training;Orthotic Fit/Training;Manual techniques;Vestibular    PT Next Visit Plan  review current HEP & update, gait with RW including ramps, curbs, stairs.    Consulted and Agree with Plan of Care  Patient       Patient will benefit from skilled therapeutic intervention in order to improve the following deficits and impairments:  Abnormal gait, Decreased activity tolerance, Decreased balance, Decreased endurance, Decreased knowledge of use of DME, Decreased mobility, Decreased range of motion, Decreased safety awareness, Decreased strength, Impaired flexibility, Postural dysfunction  Visit Diagnosis: Muscle weakness (generalized)  Unsteadiness on  feet  Other abnormalities of gait and mobility  Dizziness and giddiness  Other lack of coordination  Stiffness of left ankle, not elsewhere classified     Problem List Patient Active Problem List   Diagnosis Date Noted  . Post-operative pain   . Dementia without behavioral disturbance   . TIA (transient ischemic attack)   . Reactive hypertension   . Acute blood loss anemia   . Bimalleolar fracture of left ankle 05/03/2017  . Axonal neuropathy 03/21/2017  . Right sided weakness 03/01/2017  . Cigarette smoker 06/16/2016  . Dyspnea on exertion 06/15/2016  . Alzheimer disease 01/07/2014  . CSA (central sleep apnea) 10/05/2013  . Syncope 06/26/2013  . COPD GOLD II 06/25/2013  . BPH (benign prostatic hyperplasia) 05/18/2013  . Glaucoma 05/18/2013  . S/P right TH revision 05/14/2013  . Constipation 03/26/2013  . Osteoporosis 03/26/2013  . Expected blood loss anemia 03/23/2013  . S/P right THA, AA 03/20/2013  . Hyperlipidemia   . Erectile dysfunction   . History of asbestos exposure   . Nocturia   . History of shingles   . Dermatitis, atopic   . OA (osteoarthritis) of hip   . Arthritis   . Frequency of urination   . Lumbar scoliosis   . Malignant neoplasm of prostate (HCC) 06/07/2011  . 84 year old gentleman with stage T3 adenocarcinoma prostate with Gleason score 4+4 and PSA of 10.3 05/11/2011    Filipe Greathouse PT, DPT 07/28/2017, 12:27 PM  [facility] [address] [place], [place], [address] Phone: [phone]   Fax:  [phone]  Name: Derek Carr MRN: [id number] Date of Birth: October 25, 1931

## 2017-08-03 ENCOUNTER — Encounter: Payer: Self-pay | Admitting: Physical Therapy

## 2017-08-03 ENCOUNTER — Ambulatory Visit: Payer: Medicare HMO | Admitting: Physical Therapy

## 2017-08-03 DIAGNOSIS — R42 Dizziness and giddiness: Secondary | ICD-10-CM | POA: Diagnosis not present

## 2017-08-03 DIAGNOSIS — R2689 Other abnormalities of gait and mobility: Secondary | ICD-10-CM | POA: Diagnosis not present

## 2017-08-03 DIAGNOSIS — M6281 Muscle weakness (generalized): Secondary | ICD-10-CM | POA: Diagnosis not present

## 2017-08-03 DIAGNOSIS — M25672 Stiffness of left ankle, not elsewhere classified: Secondary | ICD-10-CM | POA: Diagnosis not present

## 2017-08-03 DIAGNOSIS — R262 Difficulty in walking, not elsewhere classified: Secondary | ICD-10-CM | POA: Diagnosis not present

## 2017-08-03 DIAGNOSIS — R2681 Unsteadiness on feet: Secondary | ICD-10-CM

## 2017-08-03 DIAGNOSIS — R278 Other lack of coordination: Secondary | ICD-10-CM

## 2017-08-03 NOTE — Therapy (Signed)
[facility] [address] [place], [place], [address] Phone: [phone]   Fax:  [phone]  Physical Therapy Treatment  Patient Details  Name: SIDHANT HELDERMAN MRN: [id number] Date of Birth: [birth year] Referring Provider: Mechele Claude, PA   Encounter Date: 08/03/2017  PT End of Session - 08/03/17 1412    Visit Number  2    Number of Visits  27    Date for PT Re-Evaluation  10/26/17    Authorization Type  [employer] Medicare & Generic commercial 2nd    PT Start Time  1317    PT Stop Time  1400    PT Time Calculation (min)  43 min    Equipment Utilized During Treatment  Gait belt    Activity Tolerance  Patient tolerated treatment well    Behavior During Therapy  [facility] for tasks assessed/performed       Past Medical History:  Diagnosis Date  . Anemia   . Ankle fracture    left  . Atherosclerosis   . Complication of anesthesia    CONFUSION - Pt's family very concerned about this  . DDD (degenerative disc disease)   . Degenerative arthritis   . Dermatitis, atopic ARMS AND LEGS  . Diverticulosis   . Erectile dysfunction   . Frequency of urination   . Glaucoma   . History of asbestos exposure   . History of radiation therapy 8/19-13-10/18/11   prostate, 92 GY  . History of shingles 2012-- BILATERAL EYES--  NO RESIDUAL  . Hyperlipidemia   . Hypertension   . Inguinal hernia   . Lumbar scoliosis   . Nocturia   . OA (osteoarthritis) of hip RIGHT  . Prostate cancer ([place]) 05/11/2011   bx=Adenocarcinoma,gleason3+4=7, 4+4=8,PSA=10.30volume=45.7cc  . Renal cyst    bilateral  . Smokers' cough ([place])   . Stroke ([place])   . Thyroid cyst     Past Surgical History:  Procedure Laterality Date  . ATTEMPTED LEFT VATS/ LEFT THORACOTOMY/ RESECTION OF THE ENORMOUS, PROBABLE BRONCHOGENIC CYST  01-04-2005  DR Arlyce Dice  . CARDIAC CATHETERIZATION  02-24-2009  DR NASHER   MINOR CORONARY ARTERY IRREGULARITIES/ NORMAL LVSF/ EF 65-70%  .  CATARACT EXTRACTION    . COLONOSCOPY W/ POLYPECTOMY    . CYSTOSCOPY  11/15/2011   Procedure: CYSTOSCOPY;  Surgeon: Dutch Gray, MD;  Location: [facility];  Service: Urology;  Laterality: N/A;  no seeds seen in bladder  . esophageus cyst removal  YRS AGO  . [place]  . ORIF ANKLE FRACTURE Left 05/03/2017  . ORIF ANKLE FRACTURE Left 05/03/2017   Procedure: OPEN REDUCTION INTERNAL FIXATION (ORIF) BIMALLEOLAR  ANKLE FRACTURE;  Surgeon: Wylene Simmer, MD;  Location: [facility];  Service: Orthopedics;  Laterality: Left;  . PROSTATE BIOPSY  05/11/11   Adenocarcinoma (MD OFFICE)  . RADIOACTIVE SEED IMPLANT  11/15/2011   Procedure: RADIOACTIVE SEED IMPLANT;  Surgeon: Dutch Gray, MD;  Location: [facility];  Service: Urology;  Laterality: N/A;  Total number of seeds - 52  . REPAIR RIGHT INGUINAL HERNIA W/ MESH  09-10-1999  . TOTAL HIP ARTHROPLASTY Right 03/20/2013   Procedure: RIGHT TOTAL HIP ARTHROPLASTY ANTERIOR APPROACH;  Surgeon: [name], MD;  Location: [facility];  Service: Orthopedics;  Laterality: Right;  . TOTAL HIP REVISION Right 05/14/2013   Procedure: open reduction internal fixation REVISION RIGHT HIP ;  Surgeon: [name], MD;  Location: [facility];  Service: Orthopedics;  Laterality: Right;  .  UMBILICAL HERNIA REPAIR  1998   epigastric    There were no vitals filed for this visit.  Subjective Assessment - 08/03/17 1321    Subjective  Pt reports no pain today. No falls since last visit.     Patient is accompained by:  Family member    Pertinent History  right ankle fracture, TIAs, DDD, Lumbar scoliosis, Glaucoma, cataract surgery, HTN, right THR 03/20/13 with revision 05/14/13, right femur fracture, dementia/Alzheimers    Limitations  Standing;Walking;House hold activities    Patient Stated Goals  Patient wants to improve his balance & walking    Currently in Pain?  No/denies           [facility] Adult PT Treatment/Exercise - 08/03/17 1353       Ambulation/Gait   Ambulation/Gait  Yes    Ambulation/Gait Assistance  4: Min guard;4: Min assist    Ambulation Distance (Feet)  115 Feet + around gym for other activities    Assistive device  Rolling walker    Gait Pattern  Step-through pattern;Decreased stance time - left;Decreased weight shift to left;Lateral hip instability;Wide base of support;Decreased stride length    Ambulation Surface  Level;Indoor    Stairs  Yes    Stairs Assistance  4: Min assist    Stairs Assistance Details (indicate cue type and reason)  cue for correct sequence    Stair Management Technique  Two rails;Forwards    Number of Stairs  4    Ramp  4: Min assist    Ramp Details (indicate cue type and reason)  cues for correct sequence and staying close to walker    Curb  4: Min assist    Curb Details (indicate cue type and reason)  cues for maintaing upright posture and foot placement    Gait Comments  Note: pt tends to push walker too far ahead and tends to shift to the left.      Neuro Re-ed    Neuro Re-ed Details   corner balance of level surface, feet together with EC and head turns side<>side and up<>down x 30 sec x 3 reps each. Pt needed min assist to maintain balance, VC for upright posture.       Lumbar Exercises: Aerobic   Recumbent Bike  to work on strengthening of LE level 1.5 x  8 mins  Scifit Stepper      Knee/Hip Exercises: Seated   Sit to [employer]  5 reps;without UE support walker placed in front for saftey. vc needed for posture          PT Short Term Goals - 07/28/17 1222      PT SHORT TERM GOAL #1   Title  Pt will be independent with initial HEP exercises (All STGs Target Date 08/26/2017)    Time  30    Period  Days    Status  New    Target Date  08/26/17      PT SHORT TERM GOAL #2   Title  Patient ambulates 250' paved surfaces with cane with supervision.     Time  30    Period  Days    Status  New    Target Date  08/26/17      PT SHORT TERM GOAL #3   Title  Timed Up & Go with  cane <18sec with supervision.     Time  30    Period  Days    Status  New    Target Date  08/26/17  PT SHORT TERM GOAL #4   Title  Patient standing balance reaching 10" anteriorly, to floor and looks over shoulders without balance loss with supervision.     Time  30    Period  Days    Status  New    Target Date  08/26/17        PT Long Term Goals - 07/28/17 1215      PT LONG TERM GOAL #1   Title  Pt will demonstrate independence with ongoing HEP program and fitness plan (All LTGs Target Date 10/26/2017)    Time  90    Period  Days    Status  New    Target Date  10/26/17      PT LONG TERM GOAL #2   Title  Patient ambulates Modified Independent 500' outdoors including grass, pavement, curbs, ramps with cane to improve community ambulatory skills    Time  90    Period  Days    Status  New    Target Date  10/26/17      PT LONG TERM GOAL #3   Title  Berg Balance >/= 45/56 to indicate lower fall risk.     Time  90    Period  Days    Status  New    Target Date  10/26/17      PT LONG TERM GOAL #4   Title  Timed Up-Go time with cane <13.5 seconds to indicate lower fall risk.     Time  90    Period  Days    Status  New    Target Date  10/26/17            Plan - 08/03/17 1412    Clinical Impression Statement  Today's skilled session focused on strengthening, balance and gt training with RW including stairs, curb and ramp. Pt was insturcted to wear shoes instead of foot boot for next session. Pt did well this treatment and would benefit from conitinued PT in order to progress to goals.    Rehab Potential  Good    PT Frequency  2x / week    PT Duration  Other (comment) 13 weeks (90 days)    PT Treatment/Interventions  Gait training;Functional mobility training;Neuromuscular re-education;Balance training;Therapeutic exercise;Therapeutic activities;Patient/family education;ADLs/Self Care Home Management;Stair training;Orthotic Fit/Training;Manual techniques;Vestibular     PT Next Visit Plan  Give pt updated HEP; continue to work on balance, ROM and strengthening of LE.    Consulted and Agree with Plan of Care  Patient       Patient will benefit from skilled therapeutic intervention in order to improve the following deficits and impairments:  Abnormal gait, Decreased activity tolerance, Decreased balance, Decreased endurance, Decreased knowledge of use of DME, Decreased mobility, Decreased range of motion, Decreased safety awareness, Decreased strength, Impaired flexibility, Postural dysfunction  Visit Diagnosis: Muscle weakness (generalized)  Unsteadiness on feet  Other abnormalities of gait and mobility  Dizziness and giddiness  Other lack of coordination  Stiffness of left ankle, not elsewhere classified     Problem List Patient Active Problem List   Diagnosis Date Noted  . Post-operative pain   . Dementia without behavioral disturbance   . TIA (transient ischemic attack)   . Reactive hypertension   . Acute blood loss anemia   . Bimalleolar fracture of left ankle 05/03/2017  . Axonal neuropathy 03/21/2017  . Right sided weakness 03/01/2017  . Cigarette smoker 06/16/2016  . Dyspnea on exertion 06/15/2016  . Alzheimer disease 01/07/2014  .  CSA (central sleep apnea) 10/05/2013  . Syncope 06/26/2013  . COPD GOLD II 06/25/2013  . BPH (benign prostatic hyperplasia) 05/18/2013  . Glaucoma 05/18/2013  . S/P right TH revision 05/14/2013  . Constipation 03/26/2013  . Osteoporosis 03/26/2013  . Expected blood loss anemia 03/23/2013  . S/P right THA, AA 03/20/2013  . Hyperlipidemia   . Erectile dysfunction   . History of asbestos exposure   . Nocturia   . History of shingles   . Dermatitis, atopic   . OA (osteoarthritis) of hip   . Arthritis   . Frequency of urination   . Lumbar scoliosis   . Malignant neoplasm of prostate ([place]) 06/07/2011  . 84 year old gentleman with stage T3 adenocarcinoma prostate with Gleason score 4+4 and PSA  of 10.3 05/11/2011    Halina Andreas, SPTA  08/03/2017, 2:25 PM  [facility] [address] [place], [place], [address] Phone: [phone]   Fax:  [phone]  Name: HISHAM PROVENCE MRN: [id number] Date of Birth: 1931-06-13

## 2017-08-05 ENCOUNTER — Encounter: Payer: Self-pay | Admitting: Physical Therapy

## 2017-08-05 ENCOUNTER — Ambulatory Visit: Payer: Medicare HMO | Admitting: Physical Therapy

## 2017-08-05 DIAGNOSIS — R42 Dizziness and giddiness: Secondary | ICD-10-CM

## 2017-08-05 DIAGNOSIS — R2689 Other abnormalities of gait and mobility: Secondary | ICD-10-CM | POA: Diagnosis not present

## 2017-08-05 DIAGNOSIS — M6281 Muscle weakness (generalized): Secondary | ICD-10-CM

## 2017-08-05 DIAGNOSIS — M25672 Stiffness of left ankle, not elsewhere classified: Secondary | ICD-10-CM

## 2017-08-05 DIAGNOSIS — R262 Difficulty in walking, not elsewhere classified: Secondary | ICD-10-CM | POA: Diagnosis not present

## 2017-08-05 DIAGNOSIS — R2681 Unsteadiness on feet: Secondary | ICD-10-CM

## 2017-08-05 DIAGNOSIS — R278 Other lack of coordination: Secondary | ICD-10-CM

## 2017-08-05 NOTE — Patient Instructions (Signed)
Access Code: [email or web address]  URL: [email or web address]  Date: 08/05/2017  Prepared by: Halina Andreas   Exercises  Seated Ankle Pumps - 10 reps - 1 sets - 1x daily - 7x weekly  Seated Ankle Alphabet - 1x daily - 5x weekly  Sit to Stand - 10 reps - 1 sets - 1x daily - 5x weekly  Narrow Base of Support Standing Balance with Head Turns on Foam Pad - 10 reps - 1 sets - 1x daily - 5x weekly  Standing Marching - 10 reps - 1 sets - 5sec hold - 1x daily - 5x weekly  Feet Together Balance with Head Nods on Foam Pad - 10 reps - 1 sets - 1x daily - 5x weekly

## 2017-08-05 NOTE — Therapy (Signed)
[facility] [address] [place], [place], [address] Phone: [phone]   Fax:  [phone]  Physical Therapy Treatment  Patient Details  Name: Derek Carr MRN: [id number] Date of Birth: 10-16-1931 Referring Provider: Mechele Claude, PA   Encounter Date: 08/05/2017  PT End of Session - 08/05/17 1501    Visit Number  3    Number of Visits  27    Date for PT Re-Evaluation  10/26/17    Authorization Type  A[employer]na Medicare & Generic commercial 2nd    PT Start Time  1404    PT Stop Time  1447   PT Time Calculation (min)  43 min    Equipment Utilized During Treatment  Gait belt    Activity Tolerance  Patient tolerated treatment well       Past Medical History:  Diagnosis Date  . Anemia   . Ankle fracture    left  . Atherosclerosis   . Complication of anesthesia    CONFUSION - Pt's family very concerned about this  . DDD (degenerative disc disease)   . Degenerative arthritis   . Dermatitis, atopic ARMS AND LEGS  . Diverticulosis   . Erectile dysfunction   . Frequency of urination   . Glaucoma   . History of asbestos exposure   . History of radiation therapy 8/19-13-10/18/11   prostate, 2 GY  . History of shingles 2012-- BILATERAL EYES--  NO RESIDUAL  . Hyperlipidemia   . Hypertension   . Inguinal hernia   . Lumbar scoliosis   . Nocturia   . OA (osteoarthritis) of hip RIGHT  . Prostate cancer ([place]) 05/11/2011   bx=Adenocarcinoma,gleason3+4=7, 4+4=8,PSA=10.30volume=45.7cc  . Renal cyst    bilateral  . Smokers' cough ([place])   . Stroke ([place])   . Thyroid cyst     Past Surgical History:  Procedure Laterality Date  . ATTEMPTED LEFT VATS/ LEFT THORACOTOMY/ RESECTION OF THE ENORMOUS, PROBABLE BRONCHOGENIC CYST  01-04-2005  DR Arlyce Dice  . CARDIAC CATHETERIZATION  02-24-2009  DR NASHER   MINOR CORONARY ARTERY IRREGULARITIES/ NORMAL LVSF/ EF 65-70%  . CATARACT EXTRACTION    . COLONOSCOPY W/ POLYPECTOMY    .  CYSTOSCOPY  11/15/2011   Procedure: CYSTOSCOPY;  Surgeon: Dutch Gray, MD;  Location: [facility];  Service: Urology;  Laterality: N/A;  no seeds seen in bladder  . esophageus cyst removal  YRS AGO  . [place]  . ORIF ANKLE FRACTURE Left 05/03/2017  . ORIF ANKLE FRACTURE Left 05/03/2017   Procedure: OPEN REDUCTION INTERNAL FIXATION (ORIF) BIMALLEOLAR  ANKLE FRACTURE;  Surgeon: Wylene Simmer, MD;  Location: [facility];  Service: Orthopedics;  Laterality: Left;  . PROSTATE BIOPSY  05/11/11   Adenocarcinoma (MD OFFICE)  . RADIOACTIVE SEED IMPLANT  11/15/2011   Procedure: RADIOACTIVE SEED IMPLANT;  Surgeon: Dutch Gray, MD;  Location: [facility];  Service: Urology;  Laterality: N/A;  Total number of seeds - 52  . REPAIR RIGHT INGUINAL HERNIA W/ MESH  09-10-1999  . TOTAL HIP ARTHROPLASTY Right 03/20/2013   Procedure: RIGHT TOTAL HIP ARTHROPLASTY ANTERIOR APPROACH;  Surgeon: [name], MD;  [facility];  Service: Orthopedics;  Laterality: Right;  . TOTAL HIP REVISION Right 05/14/2013   Procedure: open reduction internal fixation REVISION RIGHT HIP ;  Surgeon: [name], MD;  Location: [facility];  Service: Orthopedics;  Laterality: Right;  . UMBILICAL HERNIA REPAIR  1998   epigastric  There were no vitals filed for this visit.  Subjective Assessment - 08/05/17 1406    Subjective  Pt reports no pain today. No falls since last visit. Took cam boot off last night and came to therapy in regular shoe.    Patient is accompained by:  Family member    Pertinent History  right ankle fracture, TIAs, DDD, Lumbar scoliosis, Glaucoma, cataract surgery, HTN, right THR 03/20/13 with revision 05/14/13, right femur fracture, dementia/Alzheimers    Limitations  Standing;Walking;House hold activities    Patient Stated Goals  Patient wants to improve his balance & walking    Currently in Pain?  No/denies         [facility] Adult PT Treatment/Exercise - 08/05/17 1508       Neuro Re-ed    Neuro Re-ed Details   on airex pad in corner for safety nbos EC with side<>side and up<>down head turns. needing min-mod assist to maintain balance to prevent  lob. Due to lob with EC hep was updated to EO with head turns. Standing with Ue support on walker and chair behing, standing marches x 10 with 5 sec hold to work of SLS balance. pt needing vc to lift oppsite leg and keep it lifted.  Ue support on counter top pt intructed in tandem walking x2 needing mod assist for balance and prevent lob., also required vc to correctly perform; walking marches at counter top with Ue support and mod assist to maintain balance.       Knee/Hip Exercises: Standing   Other Standing Knee Exercises  Sit to stand x 10 with walker in front for safety. pt requiring min gaurd assit        Ankle Exercises: Seated   ABC's  1 rep verbal cues to avoid subsitutions    Ankle Circles/Pumps  10 reps;AROM;Both               PT Short Term Goals - 07/28/17 1222      PT SHORT TERM GOAL #1   Title  Pt will be independent with initial HEP exercises (All STGs Target Date 08/26/2017)    Time  30    Period  Days    Status  New    Target Date  08/26/17      PT SHORT TERM GOAL #2   Title  Patient ambulates 250' paved surfaces with cane with supervision.     Time  30    Period  Days    Status  New    Target Date  08/26/17      PT SHORT TERM GOAL #3   Title  Timed Up & Go with cane <18sec with supervision.     Time  30    Period  Days    Status  New    Target Date  08/26/17      PT SHORT TERM GOAL #4   Title  Patient standing balance reaching 10" anteriorly, to floor and looks over shoulders without balance loss with supervision.     Time  30    Period  Days    Status  New    Target Date  08/26/17        PT Long Term Goals - 07/28/17 1215      PT LONG TERM GOAL #1   Title  Pt will demonstrate independence with ongoing HEP program and fitness plan (All LTGs Target Date 10/26/2017)     Time  90    Period  Days  Status  New    Target Date  10/26/17      PT LONG TERM GOAL #2   Title  Patient ambulates Modified Independent 500' outdoors including grass, pavement, curbs, ramps with cane to improve community ambulatory skills    Time  90    Period  Days    Status  New    Target Date  10/26/17      PT LONG TERM GOAL #3   Title  Berg Balance >/= 45/56 to indicate lower fall risk.     Time  90    Period  Days    Status  New    Target Date  10/26/17      PT LONG TERM GOAL #4   Title  Timed Up-Go time with cane <13.5 seconds to indicate lower fall risk.     Time  90    Period  Days    Status  New    Target Date  10/26/17            Plan - 08/05/17 1502    Clinical Impression Statement  Today's session focused on initating/ updating HEP. Pt has taken off Cam boot at this time and worked on ankle rom today. Pt would benefit from continued PT in order to decrease fall risk and progress toward unmet goals.    Rehab Potential  Good    PT Frequency  2x / week    PT Duration  Other (comment) 13 weeks (90 days)    PT Treatment/Interventions  Gait training;Functional mobility training;Neuromuscular re-education;Balance training;Therapeutic exercise;Therapeutic activities;Patient/family education;ADLs/Self Care Home Management;Stair training;Orthotic Fit/Training;Manual techniques;Vestibular    PT Next Visit Plan   continue to work on balance, ROM and strengthening of LE. Safety with rw    Consulted and Agree with Plan of Care  Patient       Patient will benefit from skilled therapeutic intervention in order to improve the following deficits and impairments:  Abnormal gait, Decreased activity tolerance, Decreased balance, Decreased endurance, Decreased knowledge of use of DME, Decreased mobility, Decreased range of motion, Decreased safety awareness, Decreased strength, Impaired flexibility, Postural dysfunction  Visit Diagnosis: Muscle weakness  (generalized)  Unsteadiness on feet  Other abnormalities of gait and mobility  Dizziness and giddiness  Other lack of coordination  Stiffness of left ankle, not elsewhere classified     Problem List Patient Active Problem List   Diagnosis Date Noted  . Post-operative pain   . Dementia without behavioral disturbance   . TIA (transient ischemic attack)   . Reactive hypertension   . Acute blood loss anemia   . Bimalleolar fracture of left ankle 05/03/2017  . Axonal neuropathy 03/21/2017  . Right sided weakness 03/01/2017  . Cigarette smoker 06/16/2016  . Dyspnea on exertion 06/15/2016  . Alzheimer disease 01/07/2014  . CSA (central sleep apnea) 10/05/2013  . Syncope 06/26/2013  . COPD GOLD II 06/25/2013  . BPH (benign prostatic hyperplasia) 05/18/2013  . Glaucoma 05/18/2013  . S/P right TH revision 05/14/2013  . Constipation 03/26/2013  . Osteoporosis 03/26/2013  . Expected blood loss anemia 03/23/2013  . S/P right THA, AA 03/20/2013  . Hyperlipidemia   . Erectile dysfunction   . History of asbestos exposure   . Nocturia   . History of shingles   . Dermatitis, atopic   . OA (osteoarthritis) of hip   . Arthritis   . Frequency of urination   . Lumbar scoliosis   . Malignant neoplasm of prostate ([place]) 06/07/2011  .  84 year old gentleman with stage T3 adenocarcinoma prostate with Gleason score 4+4 and PSA of 10.3 05/11/2011   Access Code: [email or web address]  URL: [email or web address]  Date: 08/05/2017  Prepared by: Halina Andreas   Exercises   Seated Ankle Pumps - 10 reps - 1 sets - 1x daily - 7x weekly   Seated Ankle Alphabet - 1x daily - 5x weekly   Sit to Stand - 10 reps - 1 sets - 1x daily - 5x weekly   Narrow Base of Support Standing Balance with Head Turns on Foam Pad - 10 reps - 1 sets - 1x daily - 5x weekly   Standing Marching - 10 reps - 1 sets - 5sec hold - 1x daily - 5x weekly   Feet Together Balance with Head Nods on Foam Pad - 10 reps - 1  sets - 1x daily - 5x weekly     Halina Andreas, SPTA 08/05/2017, 3:26 PM  [facility] [address] [place], [place], [address] Phone: [phone]   Fax:  [phone]  Name: Derek Carr MRN: [id number] Date of Birth: Jan 11, 1932

## 2017-08-08 DIAGNOSIS — G8918 Other acute postprocedural pain: Secondary | ICD-10-CM | POA: Diagnosis not present

## 2017-08-08 DIAGNOSIS — R531 Weakness: Secondary | ICD-10-CM | POA: Diagnosis not present

## 2017-08-08 DIAGNOSIS — R351 Nocturia: Secondary | ICD-10-CM | POA: Diagnosis not present

## 2017-08-08 DIAGNOSIS — G4731 Primary central sleep apnea: Secondary | ICD-10-CM | POA: Diagnosis not present

## 2017-08-08 DIAGNOSIS — Z471 Aftercare following joint replacement surgery: Secondary | ICD-10-CM | POA: Diagnosis not present

## 2017-08-08 DIAGNOSIS — R0602 Shortness of breath: Secondary | ICD-10-CM | POA: Diagnosis not present

## 2017-08-08 DIAGNOSIS — S82842A Displaced bimalleolar fracture of left lower leg, initial encounter for closed fracture: Secondary | ICD-10-CM | POA: Diagnosis not present

## 2017-08-10 ENCOUNTER — Encounter: Payer: Self-pay | Admitting: Physical Therapy

## 2017-08-10 ENCOUNTER — Ambulatory Visit: Payer: Medicare HMO | Admitting: Physical Therapy

## 2017-08-10 DIAGNOSIS — M25672 Stiffness of left ankle, not elsewhere classified: Secondary | ICD-10-CM

## 2017-08-10 DIAGNOSIS — R262 Difficulty in walking, not elsewhere classified: Secondary | ICD-10-CM | POA: Diagnosis not present

## 2017-08-10 DIAGNOSIS — R278 Other lack of coordination: Secondary | ICD-10-CM | POA: Diagnosis not present

## 2017-08-10 DIAGNOSIS — R42 Dizziness and giddiness: Secondary | ICD-10-CM | POA: Diagnosis not present

## 2017-08-10 DIAGNOSIS — M6281 Muscle weakness (generalized): Secondary | ICD-10-CM | POA: Diagnosis not present

## 2017-08-10 DIAGNOSIS — R2681 Unsteadiness on feet: Secondary | ICD-10-CM

## 2017-08-10 DIAGNOSIS — R2689 Other abnormalities of gait and mobility: Secondary | ICD-10-CM

## 2017-08-10 NOTE — Therapy (Signed)
[facility] [address] [place], [place], [address] Phone: [phone]   Fax:  [phone]  Physical Therapy Treatment  Patient Details  Name: Derek Carr MRN: [id number] Date of Birth: [birth year] Referring Provider: Mechele Claude, PA   Encounter Date: 08/10/2017  PT End of Session - 08/10/17 1204    Visit Number  4    Number of Visits  27    Date for PT Re-Evaluation  10/26/17    Authorization Type  [employer] Medicare & Generic commercial 2nd    PT Start Time  1100    PT Stop Time  1147    PT Time Calculation (min)  47 min    Equipment Utilized During Treatment  Gait belt    Activity Tolerance  Patient tolerated treatment well       Past Medical History:  Diagnosis Date  . Anemia   . Ankle fracture    left  . Atherosclerosis   . Complication of anesthesia    CONFUSION - Pt's family very concerned about this  . DDD (degenerative disc disease)   . Degenerative arthritis   . Dermatitis, atopic ARMS AND LEGS  . Diverticulosis   . Erectile dysfunction   . Frequency of urination   . Glaucoma   . History of asbestos exposure   . History of radiation therapy 8/19-13-10/18/11   prostate, 73 GY  . History of shingles 2012-- BILATERAL EYES--  NO RESIDUAL  . Hyperlipidemia   . Hypertension   . Inguinal hernia   . Lumbar scoliosis   . Nocturia   . OA (osteoarthritis) of hip RIGHT  . Prostate cancer ([place]) 05/11/2011   bx=Adenocarcinoma,gleason3+4=7, 4+4=8,PSA=10.30volume=45.7cc  . Renal cyst    bilateral  . Smokers' cough ([place])   . Stroke ([place])   . Thyroid cyst     Past Surgical History:  Procedure Laterality Date  . ATTEMPTED LEFT VATS/ LEFT THORACOTOMY/ RESECTION OF THE ENORMOUS, PROBABLE BRONCHOGENIC CYST  01-04-2005  DR Arlyce Dice  . CARDIAC CATHETERIZATION  02-24-2009  DR NASHER   MINOR CORONARY ARTERY IRREGULARITIES/ NORMAL LVSF/ EF 65-70%  . CATARACT EXTRACTION    . COLONOSCOPY W/ POLYPECTOMY    .  CYSTOSCOPY  11/15/2011   Procedure: CYSTOSCOPY;  Surgeon: Dutch Gray, MD;  Location: [facility];  Service: Urology;  Laterality: N/A;  no seeds seen in bladder  . esophageus cyst removal  YRS AGO  . [place]  . ORIF ANKLE FRACTURE Left 05/03/2017  . ORIF ANKLE FRACTURE Left 05/03/2017   Procedure: OPEN REDUCTION INTERNAL FIXATION (ORIF) BIMALLEOLAR  ANKLE FRACTURE;  Surgeon: Wylene Simmer, MD;  Location: [facility];  Service: Orthopedics;  Laterality: Left;  . PROSTATE BIOPSY  05/11/11   Adenocarcinoma (MD OFFICE)  . RADIOACTIVE SEED IMPLANT  11/15/2011   Procedure: RADIOACTIVE SEED IMPLANT;  Surgeon: Dutch Gray, MD;  Location: [facility];  Service: Urology;  Laterality: N/A;  Total number of seeds - 52  . REPAIR RIGHT INGUINAL HERNIA W/ MESH  09-10-1999  . TOTAL HIP ARTHROPLASTY Right 03/20/2013   Procedure: RIGHT TOTAL HIP ARTHROPLASTY ANTERIOR APPROACH;  Surgeon: [name], MD;  Location: [facility];  Service: Orthopedics;  Laterality: Right;  . TOTAL HIP REVISION Right 05/14/2013   Procedure: open reduction internal fixation REVISION RIGHT HIP ;  Surgeon: [name], MD;  Location: [facility];  Service: Orthopedics;  Laterality: Right;  . UMBILICAL HERNIA REPAIR  1998   epigastric  There were no vitals filed for this visit.  Subjective Assessment - 08/10/17 1102    Subjective  Pt reports no pain today. No falls since last visit. Not wearing Cam boot at all now. Reports he has been doing HEP    Patient is accompained by:  Family member    Pertinent History  right ankle fracture, TIAs, DDD, Lumbar scoliosis, Glaucoma, cataract surgery, HTN, right THR 03/20/13 with revision 05/14/13, right femur fracture, dementia/Alzheimers    Limitations  Standing;Walking;House hold activities    Patient Stated Goals  Patient wants to improve his balance & walking    Currently in Pain?  No/denies             [facility] Adult PT Treatment/Exercise - 08/10/17  1144      Ambulation/Gait   Ambulation/Gait  Yes    Ambulation/Gait Assistance  4: Min assist    Ambulation Distance (Feet)  -- around gym for activities    Assistive device  Rolling walker    Gait Pattern  Step-through pattern;Decreased stance time - left;Decreased weight shift to left;Lateral hip instability;Wide base of support;Decreased stride length    Ramp  4: Min assist    Ramp Details (indicate cue type and reason)  Verbal cues on proper technquie and foot placement. Note pt tends to keep walker too far ahead causing foward posture and fall risk.    Curb  4: Min assist    Curb Details (indicate cue type and reason)  verbal cueing on safe technique.       Neuro Re-ed    Neuro Re-ed Details   in corner on rocker board to work on ankle ROM and balance ant<>post side<>side EO then progressing to EC x 10 reps x 2 sets each. Pt needing unilateral UE support and min assist to maintain balance. Last set was done without UE support. Verbal cues needed on rocking board fwd and maintiaining upright posture. On airex pad feet together with EO then EC x 30 secs each progressing to feet together with head turns/nods with EO then EC x 10 reps x 2 sets each. pt needing min assist and intermittent UE support to prevent LOB. Verbal and tactile cues for posture.  With chair behind for safety pt instucted on lightly tapping small targets in front to encourage SLS and balance, needing min assist to maintain balance with no UE support. Required verbal cues on weight shifting and controlling movements of LEs to increase stance time. x 5 reps each LE x 2 sets.          Lumbar Exercises: Aerobic   Nustep  4 mins/ level 2 LE strengthening      Ankle Exercises: Seated   Ankle Circles/Pumps  AROM;Strengthening;Right;Left;15 reps    Other Seated Ankle Exercises  heel & toe raises at counter top using bil [place] support x 10 reps x 1 set.           PT Short Term Goals - 07/28/17 1222      PT SHORT TERM GOAL #1    Title  Pt will be independent with initial HEP exercises (All STGs Target Date 08/26/2017)    Time  30    Period  Days    Status  New    Target Date  08/26/17      PT SHORT TERM GOAL #2   Title  Patient ambulates 250' paved surfaces with cane with supervision.     Time  30    Period  Days  Status  New    Target Date  08/26/17      PT SHORT TERM GOAL #3   Title  Timed Up & Go with cane <18sec with supervision.     Time  30    Period  Days    Status  New    Target Date  08/26/17      PT SHORT TERM GOAL #4   Title  Patient standing balance reaching 10" anteriorly, to floor and looks over shoulders without balance loss with supervision.     Time  30    Period  Days    Status  New    Target Date  08/26/17        PT Long Term Goals - 07/28/17 1215      PT LONG TERM GOAL #1   Title  Pt will demonstrate independence with ongoing HEP program and fitness plan (All LTGs Target Date 10/26/2017)    Time  90    Period  Days    Status  New    Target Date  10/26/17      PT LONG TERM GOAL #2   Title  Patient ambulates Modified Independent 500' outdoors including grass, pavement, curbs, ramps with cane to improve community ambulatory skills    Time  90    Period  Days    Status  New    Target Date  10/26/17      PT LONG TERM GOAL #3   Title  Berg Balance >/= 45/56 to indicate lower fall risk.     Time  90    Period  Days    Status  New    Target Date  10/26/17      PT LONG TERM GOAL #4   Title  Timed Up-Go time with cane <13.5 seconds to indicate lower fall risk.     Time  90    Period  Days    Status  New    Target Date  10/26/17            Plan - 08/10/17 1204    Clinical Impression Statement  Today's session focused on ankle ROM, balance, and gait with barriers. Pt had good carry over from previous session and would benefit from continued PT to decrease fall risk and progress toward goals.    Rehab Potential  Good    PT Frequency  2x / week    PT Duration   Other (comment) 13 weeks (90 days)    PT Treatment/Interventions  Gait training;Functional mobility training;Neuromuscular re-education;Balance training;Therapeutic exercise;Therapeutic activities;Patient/family education;ADLs/Self Care Home Management;Stair training;Orthotic Fit/Training;Manual techniques;Vestibular    PT Next Visit Plan   continue to work on balance, ROM and strengthening of LE. Curbs ramps and stairs.     Consulted and Agree with Plan of Care  Patient       Patient will benefit from skilled therapeutic intervention in order to improve the following deficits and impairments:  Abnormal gait, Decreased activity tolerance, Decreased balance, Decreased endurance, Decreased knowledge of use of DME, Decreased mobility, Decreased range of motion, Decreased safety awareness, Decreased strength, Impaired flexibility, Postural dysfunction  Visit Diagnosis: Muscle weakness (generalized)  Unsteadiness on feet  Other abnormalities of gait and mobility  Dizziness and giddiness  Other lack of coordination  Stiffness of left ankle, not elsewhere classified     Problem List Patient Active Problem List   Diagnosis Date Noted  . Post-operative pain   . Dementia without behavioral disturbance   .  TIA (transient ischemic attack)   . Reactive hypertension   . Acute blood loss anemia   . Bimalleolar fracture of left ankle 05/03/2017  . Axonal neuropathy 03/21/2017  . Right sided weakness 03/01/2017  . Cigarette smoker 06/16/2016  . Dyspnea on exertion 06/15/2016  . Alzheimer disease 01/07/2014  . CSA (central sleep apnea) 10/05/2013  . Syncope 06/26/2013  . COPD GOLD II 06/25/2013  . BPH (benign prostatic hyperplasia) 05/18/2013  . Glaucoma 05/18/2013  . S/P right TH revision 05/14/2013  . Constipation 03/26/2013  . Osteoporosis 03/26/2013  . Expected blood loss anemia 03/23/2013  . S/P right THA, AA 03/20/2013  . Hyperlipidemia   . Erectile dysfunction   . History of  asbestos exposure   . Nocturia   . History of shingles   . Dermatitis, atopic   . OA (osteoarthritis) of hip   . Arthritis   . Frequency of urination   . Lumbar scoliosis   . Malignant neoplasm of prostate ([place]) 06/07/2011  . 84 year old gentleman with stage T3 adenocarcinoma prostate with Gleason score 4+4 and PSA of 10.3 05/11/2011   Halina Andreas, SPTA 08/10/2017, 12:22 PM  [facility] [address] [place], [place], [address] Phone: [phone]   Fax:  [phone]  Name: Derek Carr MRN: [id number] Date of Birth: 06-06-1931

## 2017-08-12 ENCOUNTER — Encounter: Payer: Self-pay | Admitting: Physical Therapy

## 2017-08-12 ENCOUNTER — Ambulatory Visit: Payer: Medicare HMO | Admitting: Physical Therapy

## 2017-08-12 DIAGNOSIS — R262 Difficulty in walking, not elsewhere classified: Secondary | ICD-10-CM | POA: Diagnosis not present

## 2017-08-12 DIAGNOSIS — R2681 Unsteadiness on feet: Secondary | ICD-10-CM

## 2017-08-12 DIAGNOSIS — M25672 Stiffness of left ankle, not elsewhere classified: Secondary | ICD-10-CM

## 2017-08-12 DIAGNOSIS — R2689 Other abnormalities of gait and mobility: Secondary | ICD-10-CM | POA: Diagnosis not present

## 2017-08-12 DIAGNOSIS — R42 Dizziness and giddiness: Secondary | ICD-10-CM | POA: Diagnosis not present

## 2017-08-12 DIAGNOSIS — R278 Other lack of coordination: Secondary | ICD-10-CM | POA: Diagnosis not present

## 2017-08-12 DIAGNOSIS — M6281 Muscle weakness (generalized): Secondary | ICD-10-CM

## 2017-08-12 NOTE — Therapy (Signed)
[facility] [address] [place], [place], [address] Phone: [phone]   Fax:  [phone]  Physical Therapy Treatment  Patient Details  Name: Derek Carr MRN: [id number] Date of Birth: [birth year] Referring Provider: Mechele Claude, PA   Encounter Date: 08/12/2017  PT End of Session - 08/12/17 1244    Visit Number  5    Number of Visits  27    Date for PT Re-Evaluation  10/26/17    Authorization Type  [employer]na Medicare & Generic commercial 2nd    PT Start Time  1146    PT Stop Time  1231    PT Time Calculation (min)  45 min    Equipment Utilized During Treatment  Gait belt    Activity Tolerance  Patient tolerated treatment well       Past Medical History:  Diagnosis Date  . Anemia   . Ankle fracture    left  . Atherosclerosis   . Complication of anesthesia    CONFUSION - Pt's family very concerned about this  . DDD (degenerative disc disease)   . Degenerative arthritis   . Dermatitis, atopic ARMS AND LEGS  . Diverticulosis   . Erectile dysfunction   . Frequency of urination   . Glaucoma   . History of asbestos exposure   . History of radiation therapy 8/19-13-10/18/11   prostate, 8 GY  . History of shingles 2012-- BILATERAL EYES--  NO RESIDUAL  . Hyperlipidemia   . Hypertension   . Inguinal hernia   . Lumbar scoliosis   . Nocturia   . OA (osteoarthritis) of hip RIGHT  . Prostate cancer ([place]) 05/11/2011   bx=Adenocarcinoma,gleason3+4=7, 4+4=8,PSA=10.30volume=45.7cc  . Renal cyst    bilateral  . Smokers' cough ([place])   . Stroke ([place])   . Thyroid cyst     Past Surgical History:  Procedure Laterality Date  . ATTEMPTED LEFT VATS/ LEFT THORACOTOMY/ RESECTION OF THE ENORMOUS, PROBABLE BRONCHOGENIC CYST  01-04-2005  DR Arlyce Dice  . CARDIAC CATHETERIZATION  02-24-2009  DR NASHER   MINOR CORONARY ARTERY IRREGULARITIES/ NORMAL LVSF/ EF 65-70%  . CATARACT EXTRACTION    . COLONOSCOPY W/ POLYPECTOMY    .  CYSTOSCOPY  11/15/2011   Procedure: CYSTOSCOPY;  Surgeon: Dutch Gray, MD;  Location: [facility];  Service: Urology;  Laterality: N/A;  no seeds seen in bladder  . esophageus cyst removal  YRS AGO  . [place]  . ORIF ANKLE FRACTURE Left 05/03/2017  . ORIF ANKLE FRACTURE Left 05/03/2017   Procedure: OPEN REDUCTION INTERNAL FIXATION (ORIF) BIMALLEOLAR  ANKLE FRACTURE;  Surgeon: Wylene Simmer, MD;  Location: [facility];  Service: Orthopedics;  Laterality: Left;  . PROSTATE BIOPSY  05/11/11   Adenocarcinoma ([facility] OFFICE)  . RADIOACTIVE SEED IMPLANT  11/15/2011   Procedure: RADIOACTIVE SEED IMPLANT;  Surgeon: Dutch Gray, MD;  Location: [facility];  Service: Urology;  Laterality: N/A;  Total number of seeds - 52  . REPAIR RIGHT INGUINAL HERNIA W/ MESH  09-10-1999  . TOTAL HIP ARTHROPLASTY Right 03/20/2013   Procedure: RIGHT TOTAL HIP ARTHROPLASTY ANTERIOR APPROACH;  Surgeon: [name], MD;  [facility];  Service: Orthopedics;  Laterality: Right;  . TOTAL HIP REVISION Right 05/14/2013   Procedure: open reduction internal fixation REVISION RIGHT HIP ;  Surgeon: [name], MD;  Location: [facility];  Service: Orthopedics;  Laterality: Right;  . UMBILICAL HERNIA REPAIR  1998   epigastric  There were no vitals filed for this visit.  Subjective Assessment - 08/12/17 1149    Subjective  Daughter came to appointment to discuss putting cam boot back on pt due to ankle pain. She said she spoke with ortho about keeping boot on.  Pt says none of the ex's on HEP excaerbate ankle pain. Pt and his wife are going to [facility] Monday thought Friday of next week to visit family    Patient is accompained by:  Family member    Pertinent History  right ankle fracture, TIAs, DDD, Lumbar scoliosis, Glaucoma, cataract surgery, HTN, right THR 03/20/13 with revision 05/14/13, right femur fracture, dementia/Alzheimers    Limitations  Standing;Walking;House hold activities    Patient  Stated Goals  Patient wants to improve his balance & walking    Currently in Pain?  No/denies                       [facility] Adult PT Treatment/Exercise - 08/12/17 1249      Ambulation/Gait   Ambulation/Gait  Yes    Ambulation/Gait Assistance  4: Min guard;4: Min assist    Ambulation/Gait Assistance Details  vebral cues on posture and min assist around curves    Ambulation Distance (Feet)  115 Feet + around gym for other activities    Assistive device  -- straight cane with  quad tip    Ambulation Surface  Level;Indoor    Gait Comments  Pt did well with cane today. He stated he has a cane at home.      Neuro Re-ed    Neuro Re-ed Details   In corner with chair for safety, on level surface tandem stance x 15 secs x 2 set each LE. pt required occasional UE support and min guard assist to maintain balance. On airex pad in corner with feet together working on diagonal head turns with EO then progressing to EC x 10 reps x 3 sets each. On airex pad reaching outside of base of support X 10 each hand Needing vc on posture and min assist with intermittent UE support. At counter top pt intructed in side stepping and fwd marches. With marches pt required UE support to prevent LOB. Verbal cues needed to lift legs and hold for 3 secs to promote SLS. Min assist to prevent LOB.      Ankle Exercises: Standing   Other Standing Ankle Exercises  Heel/toe raises x 10 reps each x 1 set                PT Short Term Goals - 07/28/17 1222      PT SHORT TERM GOAL #1   Title  Pt will be independent with initial HEP exercises (All STGs Target Date 08/26/2017)    Time  30    Period  Days    Status  New    Target Date  08/26/17      PT SHORT TERM GOAL #2   Title  Patient ambulates 250' paved surfaces with cane with supervision.     Time  30    Period  Days    Status  New    Target Date  08/26/17      PT SHORT TERM GOAL #3   Title  Timed Up & Go with cane <18sec with supervision.      Time  30    Period  Days    Status  New    Target Date  08/26/17  PT SHORT TERM GOAL #4   Title  Patient standing balance reaching 10" anteriorly, to floor and looks over shoulders without balance loss with supervision.     Time  30    Period  Days    Status  New    Target Date  08/26/17        PT Long Term Goals - 07/28/17 1215      PT LONG TERM GOAL #1   Title  Pt will demonstrate independence with ongoing HEP program and fitness plan (All LTGs Target Date 10/26/2017)    Time  90    Period  Days    Status  New    Target Date  10/26/17      PT LONG TERM GOAL #2   Title  Patient ambulates Modified Independent 500' outdoors including grass, pavement, curbs, ramps with cane to improve community ambulatory skills    Time  90    Period  Days    Status  New    Target Date  10/26/17      PT LONG TERM GOAL #3   Title  Berg Balance >/= 45/56 to indicate lower fall risk.     Time  90    Period  Days    Status  New    Target Date  10/26/17      PT LONG TERM GOAL #4   Title  Timed Up-Go time with cane <13.5 seconds to indicate lower fall risk.     Time  90    Period  Days    Status  New    Target Date  10/26/17            Plan - 08/12/17 1244    Clinical Impression Statement  Today's skilled session focused on gait with a cane, balance and LE strengthening. Pt's daughter attended with beginning of the session to speak with PTA about pt's cam boot. Pt is progressing well and would benefit from continued PT in order to progress and met goals.    Rehab Potential  Good    PT Frequency  2x / week    PT Duration  Other (comment) 13 weeks (90 days)    PT Treatment/Interventions  Gait training;Functional mobility training;Neuromuscular re-education;Balance training;Therapeutic exercise;Therapeutic activities;Patient/family education;ADLs/Self Care Home Management;Stair training;Orthotic Fit/Training;Manual techniques;Vestibular    PT Next Visit Plan Check STGs on pt's return  from vacation.  Gait with straight cane quad tip, continue to work on balance, ROM and strengthening of LE. Curbs ramps and stairs.     Consulted and Agree with Plan of Care  Patient       Patient will benefit from skilled therapeutic intervention in order to improve the following deficits and impairments:  Abnormal gait, Decreased activity tolerance, Decreased balance, Decreased endurance, Decreased knowledge of use of DME, Decreased mobility, Decreased range of motion, Decreased safety awareness, Decreased strength, Impaired flexibility, Postural dysfunction  Visit Diagnosis: Muscle weakness (generalized)  Unsteadiness on feet  Other abnormalities of gait and mobility  Dizziness and giddiness  Other lack of coordination  Stiffness of left ankle, not elsewhere classified     Problem List Patient Active Problem List   Diagnosis Date Noted  . Post-operative pain   . Dementia without behavioral disturbance   . TIA (transient ischemic attack)   . Reactive hypertension   . Acute blood loss anemia   . Bimalleolar fracture of left ankle 05/03/2017  . Axonal neuropathy 03/21/2017  . Right sided weakness 03/01/2017  . Cigarette  smoker 06/16/2016  . Dyspnea on exertion 06/15/2016  . Alzheimer disease 01/07/2014  . CSA (central sleep apnea) 10/05/2013  . Syncope 06/26/2013  . COPD GOLD II 06/25/2013  . BPH (benign prostatic hyperplasia) 05/18/2013  . Glaucoma 05/18/2013  . S/P right TH revision 05/14/2013  . Constipation 03/26/2013  . Osteoporosis 03/26/2013  . Expected blood loss anemia 03/23/2013  . S/P right THA, AA 03/20/2013  . Hyperlipidemia   . Erectile dysfunction   . History of asbestos exposure   . Nocturia   . History of shingles   . Dermatitis, atopic   . OA (osteoarthritis) of hip   . Arthritis   . Frequency of urination   . Lumbar scoliosis   . Malignant neoplasm of prostate ([place]) 06/07/2011  . 84 year old gentleman with stage T3 adenocarcinoma prostate  with Gleason score 4+4 and PSA of 10.3 05/11/2011   Halina Andreas, SPTA 08/12/2017, 1:06 PM  [facility] [address] [place], [place], [address] Phone: [phone]   Fax:  [phone]  Name: BRELAND TROUTEN MRN: [id number] Date of Birth: 1931-10-24

## 2017-08-16 ENCOUNTER — Ambulatory Visit: Payer: Medicare HMO | Admitting: Physical Therapy

## 2017-08-18 ENCOUNTER — Ambulatory Visit: Payer: Medicare HMO | Admitting: Physical Therapy

## 2017-08-22 ENCOUNTER — Ambulatory Visit: Payer: Medicare HMO | Admitting: Physical Therapy

## 2017-08-22 ENCOUNTER — Encounter: Payer: Self-pay | Admitting: Physical Therapy

## 2017-08-22 DIAGNOSIS — R278 Other lack of coordination: Secondary | ICD-10-CM | POA: Diagnosis not present

## 2017-08-22 DIAGNOSIS — M6281 Muscle weakness (generalized): Secondary | ICD-10-CM | POA: Diagnosis not present

## 2017-08-22 DIAGNOSIS — R2681 Unsteadiness on feet: Secondary | ICD-10-CM

## 2017-08-22 DIAGNOSIS — R2689 Other abnormalities of gait and mobility: Secondary | ICD-10-CM

## 2017-08-22 DIAGNOSIS — M25672 Stiffness of left ankle, not elsewhere classified: Secondary | ICD-10-CM | POA: Diagnosis not present

## 2017-08-22 DIAGNOSIS — R42 Dizziness and giddiness: Secondary | ICD-10-CM | POA: Diagnosis not present

## 2017-08-22 DIAGNOSIS — R262 Difficulty in walking, not elsewhere classified: Secondary | ICD-10-CM

## 2017-08-22 NOTE — Patient Instructions (Signed)
Access Code: [email or web address]  URL: [email or web address]  Date: 08/05/2017  Prepared by: Halina Andreas   Exercises  Seated Ankle Pumps - 10 reps - 1 sets - 1x daily - 7x weekly  Seated Ankle Alphabet - 1x daily - 5x weekly  Sit to Stand - 10 reps - 1 sets - 1x daily - 5x weekly  Narrow Base of Support Standing Balance with Head Turns on Foam Pad - 10 reps - 1 sets - 1x daily - 5x weekly  Standing Marching - 10 reps - 1 sets - 5sec hold - 1x daily - 5x weekly  Feet Together Balance with Head Nods on Foam Pad - 10 reps - 1 sets - 1x daily - 5x weekly

## 2017-08-22 NOTE — Therapy (Signed)
[facility] [address] [place], [place], [address] Phone: [phone]   Fax:  [phone]  Physical Therapy Treatment  Patient Details  Name: Derek Carr MRN: [id number] Date of Birth: 07-12-1931 Referring Provider: Mechele Claude, PA   Encounter Date: 08/22/2017  PT End of Session - 08/22/17 1017    Visit Number  6    Number of Visits  27    Date for PT Re-Evaluation  10/26/17    Authorization Type  [employer] Medicare & Generic commercial 2nd    PT Start Time  [id number]    PT Stop Time  1015    PT Time Calculation (min)  38 min    Equipment Utilized During Treatment  Gait belt    Activity Tolerance  Patient tolerated treatment well       Past Medical History:  Diagnosis Date  . Anemia   . Ankle fracture    left  . Atherosclerosis   . Complication of anesthesia    CONFUSION - Pt's family very concerned about this  . DDD (degenerative disc disease)   . Degenerative arthritis   . Dermatitis, atopic ARMS AND LEGS  . Diverticulosis   . Erectile dysfunction   . Frequency of urination   . Glaucoma   . History of asbestos exposure   . History of radiation therapy 8/19-13-10/18/11   prostate, 55 GY  . History of shingles 2012-- BILATERAL EYES--  NO RESIDUAL  . Hyperlipidemia   . Hypertension   . Inguinal hernia   . Lumbar scoliosis   . Nocturia   . OA (osteoarthritis) of hip RIGHT  . Prostate cancer ([place]) 05/11/2011   bx=Adenocarcinoma,gleason3+4=7, 4+4=8,PSA=10.30volume=45.7cc  . Renal cyst    bilateral  . Smokers' cough ([place])   . Stroke ([place])   . Thyroid cyst     Past Surgical History:  Procedure Laterality Date  . ATTEMPTED LEFT VATS/ LEFT THORACOTOMY/ RESECTION OF THE ENORMOUS, PROBABLE BRONCHOGENIC CYST  01-04-2005  DR Arlyce Dice  . CARDIAC CATHETERIZATION  02-24-2009  DR NASHER   MINOR CORONARY ARTERY IRREGULARITIES/ NORMAL LVSF/ EF 65-70%  . CATARACT EXTRACTION    . COLONOSCOPY W/ POLYPECTOMY    .  CYSTOSCOPY  11/15/2011   Procedure: CYSTOSCOPY;  Surgeon: Dutch Gray, MD;  Location: [facility];  Service: Urology;  Laterality: N/A;  no seeds seen in bladder  . esophageus cyst removal  YRS AGO  . [place]  . ORIF ANKLE FRACTURE Left 05/03/2017  . ORIF ANKLE FRACTURE Left 05/03/2017   Procedure: OPEN REDUCTION INTERNAL FIXATION (ORIF) BIMALLEOLAR  ANKLE FRACTURE;  Surgeon: Wylene Simmer, MD;  Location: [facility];  Service: Orthopedics;  Laterality: Left;  . PROSTATE BIOPSY  05/11/11   Adenocarcinoma (MD OFFICE)  . RADIOACTIVE SEED IMPLANT  11/15/2011   Procedure: RADIOACTIVE SEED IMPLANT;  Surgeon: Dutch Gray, MD;  [facility];  Service: Urology;  Laterality: N/A;  Total number of seeds - 52  . REPAIR RIGHT INGUINAL HERNIA W/ MESH  09-10-1999  . TOTAL HIP ARTHROPLASTY Right 03/20/2013   Procedure: RIGHT TOTAL HIP ARTHROPLASTY ANTERIOR APPROACH;  Surgeon: [name], MD;  Location: [facility];  Service: Orthopedics;  Laterality: Right;  . TOTAL HIP REVISION Right 05/14/2013   Procedure: open reduction internal fixation REVISION RIGHT HIP ;  Surgeon: [name], MD;  Location: [facility];  Service: Orthopedics;  Laterality: Right;  . UMBILICAL HERNIA REPAIR  1998   epigastric  There were no vitals filed for this visit.  Subjective Assessment - 08/22/17 0938    Subjective  Pt did not come the therapy with the boot. Had a "little bit of pain" over the weekend; but no pain currently.  Pt is back from visiting daughter in [place]; it went well.    Patient is accompained by:  Family member    Pertinent History  right ankle fracture, TIAs, DDD, Lumbar scoliosis, Glaucoma, cataract surgery, HTN, right THR 03/20/13 with revision 05/14/13, right femur fracture, dementia/Alzheimers    Limitations  Standing;Walking;House hold activities    Patient Stated Goals  Patient wants to improve his balance & walking    Currently in Pain?  No/denies             Performed exercises during session demonstrating understanding with min to no cues.  Exercises  Seated Ankle Pumps - 10 reps - 1 sets - Seated Ankle Alphabet  Sit to Stand - 10 reps - 1 sets -   Narrow Base of Support Standing Balance with Head Turns on Foam Pad Standing Marching - 10 reps - 1 sets - 5sec hold  Feet Together Balance with Head Nods on Foam Pad - 10 reps - 1 sets             [facility] Adult PT Treatment/Exercise - 08/22/17 0001      Ambulation/Gait   Ambulation/Gait  Yes    Ambulation/Gait Assistance  4: Min guard;5: Supervision    Ambulation/Gait Assistance Details  unsteady but no LOB.    Ambulation Distance (Feet)  230 Feet    Assistive device  Straight cane h quad tip    Gait Pattern  Step-through pattern;Decreased stance time - left;Decreased weight shift to left;Lateral hip instability;Wide base of support;Decreased stride length    Ambulation Surface  Level;Indoor          Balance Exercises - 08/22/17 1010      Balance Exercises: Standing   Standing Eyes Opened  Wide (BOA) multi level reaching; upper trunk rotations looking behind,         PT Education - 08/22/17 0953    Education provided  Yes    Education Details  reviewed current HEP       PT Short Term Goals - 08/22/17 0956      PT SHORT TERM GOAL #1   Title  Pt will be independent with initial HEP exercises (All STGs Target Date 08/26/2017)    Baseline  Pt will be independent with initial HEP exercises; MET 08/22/17.    Time  30    Period  Days    Status  New      PT SHORT TERM GOAL #2   Title  Patient ambulates 250' paved surfaces with cane with supervision.     Time  30    Period  Days    Status  New      PT SHORT TERM GOAL #3   Title  Timed Up & Go with cane <18sec with supervision.     Time  30    Period  Days    Status  New      PT SHORT TERM GOAL #4   Title  Patient standing balance reaching 10" anteriorly, to floor and looks over shoulders without  balance loss with supervision.     Time  30    Period  Days    Status  New        PT Long Term Goals -  07/28/17 1215      PT LONG TERM GOAL #1   Title  Pt will demonstrate independence with ongoing HEP program and fitness plan (All LTGs Target Date 10/26/2017)    Time  90    Period  Days    Status  New    Target Date  10/26/17      PT LONG TERM GOAL #2   Title  Patient ambulates Modified Independent 500' outdoors including grass, pavement, curbs, ramps with cane to improve community ambulatory skills    Time  90    Period  Days    Status  New    Target Date  10/26/17      PT LONG TERM GOAL #3   Title  Berg Balance >/= 45/56 to indicate lower fall risk.     Time  90    Period  Days    Status  New    Target Date  10/26/17      PT LONG TERM GOAL #4   Title  Timed Up-Go time with cane <13.5 seconds to indicate lower fall risk.     Time  90    Period  Days    Status  New    Target Date  10/26/17            Plan - 08/22/17 1233    Clinical Impression Statement  Skilled session focused on reviewing/performing current HEP; pt demonstrates independence with HEP meeting STG #1 and gait with cane (quad tip); pt ambulates at a min guard level demonstrating being unsteady but had no LOB.    Rehab Potential  Good    PT Frequency  2x / week    PT Duration  Other (comment) 13 weeks (90 days)    PT Treatment/Interventions  Gait training;Functional mobility training;Neuromuscular re-education;Balance training;Therapeutic exercise;Therapeutic activities;Patient/family education;ADLs/Self Care Home Management;Stair training;Orthotic Fit/Training;Manual techniques;Vestibular    PT Next Visit Plan  Check STGs    Consulted and Agree with Plan of Care  Patient       Patient will benefit from skilled therapeutic intervention in order to improve the following deficits and impairments:  Abnormal gait, Decreased activity tolerance, Decreased balance, Decreased endurance, Decreased  knowledge of use of DME, Decreased mobility, Decreased range of motion, Decreased safety awareness, Decreased strength, Impaired flexibility, Postural dysfunction  Visit Diagnosis: Muscle weakness (generalized)  Unsteadiness on feet  Other abnormalities of gait and mobility  Dizziness and giddiness  Other lack of coordination  Difficulty in walking, not elsewhere classified     Problem List Patient Active Problem List   Diagnosis Date Noted  . Post-operative pain   . Dementia without behavioral disturbance   . TIA (transient ischemic attack)   . Reactive hypertension   . Acute blood loss anemia   . Bimalleolar fracture of left ankle 05/03/2017  . Axonal neuropathy 03/21/2017  . Right sided weakness 03/01/2017  . Cigarette smoker 06/16/2016  . Dyspnea on exertion 06/15/2016  . Alzheimer disease 01/07/2014  . CSA (central sleep apnea) 10/05/2013  . Syncope 06/26/2013  . COPD GOLD II 06/25/2013  . BPH (benign prostatic hyperplasia) 05/18/2013  . Glaucoma 05/18/2013  . S/P right TH revision 05/14/2013  . Constipation 03/26/2013  . Osteoporosis 03/26/2013  . Expected blood loss anemia 03/23/2013  . S/P right THA, AA 03/20/2013  . Hyperlipidemia   . Erectile dysfunction   . History of asbestos exposure   . Nocturia   . History of shingles   . Dermatitis, atopic   .  OA (osteoarthritis) of hip   . Arthritis   . Frequency of urination   . Lumbar scoliosis   . Malignant neoplasm of prostate ([place]) 06/07/2011  . 84 year old gentleman with stage T3 adenocarcinoma prostate with Gleason score 4+4 and PSA of 10.3 05/11/2011    Bjorn Loser, PTA  08/22/17, 12:42 PM [facility] [address] [place], [place], [address] Phone: [phone]   Fax:  [phone]  Name: Derek Carr MRN: [id number] Date of Birth: [birth year]

## 2017-08-24 ENCOUNTER — Ambulatory Visit (INDEPENDENT_AMBULATORY_CARE_PROVIDER_SITE_OTHER): Payer: PRIVATE HEALTH INSURANCE | Admitting: Neurology

## 2017-08-24 ENCOUNTER — Ambulatory Visit: Payer: Medicare HMO | Admitting: Physical Therapy

## 2017-08-24 ENCOUNTER — Encounter: Payer: Self-pay | Admitting: Physical Therapy

## 2017-08-24 DIAGNOSIS — Z8673 Personal history of transient ischemic attack (TIA), and cerebral infarction without residual deficits: Secondary | ICD-10-CM

## 2017-08-24 DIAGNOSIS — R262 Difficulty in walking, not elsewhere classified: Secondary | ICD-10-CM

## 2017-08-24 DIAGNOSIS — R351 Nocturia: Secondary | ICD-10-CM | POA: Diagnosis not present

## 2017-08-24 DIAGNOSIS — I69398 Other sequelae of cerebral infarction: Secondary | ICD-10-CM

## 2017-08-24 DIAGNOSIS — F0150 Vascular dementia without behavioral disturbance: Secondary | ICD-10-CM

## 2017-08-24 DIAGNOSIS — G4710 Hypersomnia, unspecified: Secondary | ICD-10-CM

## 2017-08-24 DIAGNOSIS — R42 Dizziness and giddiness: Secondary | ICD-10-CM | POA: Diagnosis not present

## 2017-08-24 DIAGNOSIS — I639 Cerebral infarction, unspecified: Secondary | ICD-10-CM

## 2017-08-24 DIAGNOSIS — M6281 Muscle weakness (generalized): Secondary | ICD-10-CM

## 2017-08-24 DIAGNOSIS — R278 Other lack of coordination: Secondary | ICD-10-CM | POA: Diagnosis not present

## 2017-08-24 DIAGNOSIS — R2689 Other abnormalities of gait and mobility: Secondary | ICD-10-CM

## 2017-08-24 DIAGNOSIS — M25672 Stiffness of left ankle, not elsewhere classified: Secondary | ICD-10-CM | POA: Diagnosis not present

## 2017-08-24 DIAGNOSIS — G4737 Central sleep apnea in conditions classified elsewhere: Secondary | ICD-10-CM

## 2017-08-24 DIAGNOSIS — G4719 Other hypersomnia: Secondary | ICD-10-CM

## 2017-08-24 DIAGNOSIS — R2681 Unsteadiness on feet: Secondary | ICD-10-CM

## 2017-08-24 NOTE — Therapy (Signed)
[facility] [address] [place], [place], [address] Phone: [phone]   Fax:  [phone]  Physical Therapy Treatment  Patient Details  Name: Derek Carr MRN: [id number] Date of Birth: [birth year] Referring Provider: Mechele Claude, PA   Encounter Date: 08/24/2017  PT End of Session - 08/24/17 1019    Visit Number  7    Number of Visits  27    Date for PT Re-Evaluation  10/26/17    Authorization Type  [employer]na Medicare & Generic commercial 2nd    PT Start Time  [phone]    PT Stop Time  1018    PT Time Calculation (min)  45 min    Equipment Utilized During Treatment  Gait belt    Activity Tolerance  Patient tolerated treatment well       Past Medical History:  Diagnosis Date  . Anemia   . Ankle fracture    left  . Atherosclerosis   . Complication of anesthesia    CONFUSION - Pt's family very concerned about this  . DDD (degenerative disc disease)   . Degenerative arthritis   . Dermatitis, atopic ARMS AND LEGS  . Diverticulosis   . Erectile dysfunction   . Frequency of urination   . Glaucoma   . History of asbestos exposure   . History of radiation therapy 8/19-13-10/18/11   prostate, 31 GY  . History of shingles 2012-- BILATERAL EYES--  NO RESIDUAL  . Hyperlipidemia   . Hypertension   . Inguinal hernia   . Lumbar scoliosis   . Nocturia   . OA (osteoarthritis) of hip RIGHT  . Prostate cancer ([place]) 05/11/2011   bx=Adenocarcinoma,gleason3+4=7, 4+4=8,PSA=10.30volume=45.7cc  . Renal cyst    bilateral  . Smokers' cough ([place])   . Stroke ([place])   . Thyroid cyst     Past Surgical History:  Procedure Laterality Date  . ATTEMPTED LEFT VATS/ LEFT THORACOTOMY/ RESECTION OF THE ENORMOUS, PROBABLE BRONCHOGENIC CYST  01-04-2005  DR Arlyce Dice  . CARDIAC CATHETERIZATION  02-24-2009  DR NASHER   MINOR CORONARY ARTERY IRREGULARITIES/ NORMAL LVSF/ EF 65-70%  . CATARACT EXTRACTION    . COLONOSCOPY W/ POLYPECTOMY    .  CYSTOSCOPY  11/15/2011   Procedure: CYSTOSCOPY;  Surgeon: Dutch Gray, MD;  Location: [facility];  Service: Urology;  Laterality: N/A;  no seeds seen in bladder  . esophageus cyst removal  YRS AGO  . [place]  . ORIF ANKLE FRACTURE Left 05/03/2017  . ORIF ANKLE FRACTURE Left 05/03/2017   Procedure: OPEN REDUCTION INTERNAL FIXATION (ORIF) BIMALLEOLAR  ANKLE FRACTURE;  Surgeon: Wylene Simmer, MD;  Location: [facility];  Service: Orthopedics;  Laterality: Left;  . PROSTATE BIOPSY  05/11/11   Adenocarcinoma (MD OFFICE)  . RADIOACTIVE SEED IMPLANT  11/15/2011   Procedure: RADIOACTIVE SEED IMPLANT;  Surgeon: Dutch Gray, MD;  Location: [facility];  Service: Urology;  Laterality: N/A;  Total number of seeds - 52  . REPAIR RIGHT INGUINAL HERNIA W/ MESH  09-10-1999  . TOTAL HIP ARTHROPLASTY Right 03/20/2013   Procedure: RIGHT TOTAL HIP ARTHROPLASTY ANTERIOR APPROACH;  Surgeon: [name], MD;  Location: [facility];  Service: Orthopedics;  Laterality: Right;  . TOTAL HIP REVISION Right 05/14/2013   Procedure: open reduction internal fixation REVISION RIGHT HIP ;  Surgeon: [name], MD;  Location: [facility];  Service: Orthopedics;  Laterality: Right;  . UMBILICAL HERNIA REPAIR  1998   epigastric  There were no vitals filed for this visit.  Subjective Assessment - 08/24/17 0936    Subjective  Pt came into therapy with cam boot on Left foot due to having pain in left ankle yesterday.  Per MD orders: pt is to wear cam boot when ankle is in pain.    Patient is accompained by:  Family member    Pertinent History  right ankle fracture, TIAs, DDD, Lumbar scoliosis, Glaucoma, cataract surgery, HTN, right THR 03/20/13 with revision 05/14/13, right femur fracture, dementia/Alzheimers    Limitations  Standing;Walking;House hold activities    Patient Stated Goals  Patient wants to improve his balance & walking    Currently in Pain?  No/denies         [facility] PT  Assessment - 08/24/17 0001      Timed Up and Go Test   TUG  Normal TUG    Normal TUG (seconds)  31.78                   [facility] Adult PT Treatment/Exercise - 08/24/17 0001      Ambulation/Gait   Ambulation/Gait  Yes    Ambulation/Gait Assistance  4: Min guard;5: Supervision    Ambulation/Gait Assistance Details  Cam boot wore on L foot; unsteady but no LOB; cues for posture and right foot clearance                             Ambulation Distance (Feet)  250 Feet    Assistive device  Straight cane with quad tip    Gait Pattern  Step-through pattern;Decreased stance time - left;Decreased weight shift to left;Lateral hip instability;Wide base of support;Decreased stride length;Decreased step length - left;Poor foot clearance - right    Ambulation Surface  Outdoor;Paved;Unlevel          Balance Exercises - 08/24/17 1327      Balance Exercises: Standing   Standing Eyes Opened  Wide ([place]) multilevel reaching with 1 UE support        PT Education - 08/24/17 1328    Education provided  Yes    Education Details  discussed goals checked and POC    Person(s) Educated  Patient;Child(ren)    Methods  Explanation    Comprehension  Verbalized understanding       PT Short Term Goals - 08/24/17 1313      PT SHORT TERM GOAL #1   Title  Pt will be independent with initial HEP exercises (All STGs Target Date 08/26/2017)    Baseline  Pt will be independent with initial HEP exercises; MET 08/22/17.    Time  30    Period  Days    Status  Achieved      PT SHORT TERM GOAL #2   Title  Patient ambulates 250' paved surfaces with cane with supervision.     Baseline  Patient ambulates 250' paved surfaces with cane with Min Guard. Pt had cam boot on L foot due to pain previous day; 08/24/17.    Time  30    Period  Days    Status  Partially Met      PT SHORT TERM GOAL #3   Title  Timed Up & Go with cane <18sec with supervision.     Baseline  Timed Up & Go with cane 31.78 with  supervision. Pt had cam boot on L foot due to pain previous day; 08/24/17.    Time  30  Period  Days    Status  Not Met      PT SHORT TERM GOAL #4   Title  Patient standing balance reaching 10" anteriorly, to floor and looks over shoulders without balance loss with supervision.     Baseline  Patient standing balance reaching 10" anteriorly, to floor and looks over shoulders without balance loss with Min A. Pt had cam boot on L foot due to pain previous day; 08/24/17.    Time  30    Period  Days    Status  Not Met        PT Long Term Goals - 07/28/17 1215      PT LONG TERM GOAL #1   Title  Pt will demonstrate independence with ongoing HEP program and fitness plan (All LTGs Target Date 10/26/2017)    Time  90    Period  Days    Status  New    Target Date  10/26/17      PT LONG TERM GOAL #2   Title  Patient ambulates Modified Independent 500' outdoors including grass, pavement, curbs, ramps with cane to improve community ambulatory skills    Time  90    Period  Days    Status  New    Target Date  10/26/17      PT LONG TERM GOAL #3   Title  Berg Balance >/= 45/56 to indicate lower fall risk.     Time  90    Period  Days    Status  New    Target Date  10/26/17      PT LONG TERM GOAL #4   Title  Timed Up-Go time with cane <13.5 seconds to indicate lower fall risk.     Time  90    Period  Days    Status  New    Target Date  10/26/17            Plan - 08/24/17 1135    Clinical Impression Statement  Pt had cam boot on L foot due to pain previous day and wore throughout STG check. Pt met STG # 1 but did not meet remaining STGs demonstrating insufficient gains in  balance and gait speed requiring  increased assist vs. goal level.    Rehab Potential  Good    PT Frequency  2x / week    PT Duration  Other (comment) 13 weeks (90 days)    PT Treatment/Interventions  Gait training;Functional mobility training;Neuromuscular re-education;Balance training;Therapeutic  exercise;Therapeutic activities;Patient/family education;ADLs/Self Care Home Management;Stair training;Orthotic Fit/Training;Manual techniques;Vestibular    PT Next Visit Plan  dynamic standing  balance; LE strengthening; gait with SPC.    Consulted and Agree with Plan of Care  Patient       Patient will benefit from skilled therapeutic intervention in order to improve the following deficits and impairments:  Abnormal gait, Decreased activity tolerance, Decreased balance, Decreased endurance, Decreased knowledge of use of DME, Decreased mobility, Decreased range of motion, Decreased safety awareness, Decreased strength, Impaired flexibility, Postural dysfunction  Visit Diagnosis: Muscle weakness (generalized)  Unsteadiness on feet  Other abnormalities of gait and mobility  Difficulty in walking, not elsewhere classified     Problem List Patient Active Problem List   Diagnosis Date Noted  . Post-operative pain   . Dementia without behavioral disturbance   . TIA (transient ischemic attack)   . Reactive hypertension   . Acute blood loss anemia   . Bimalleolar fracture of left ankle 05/03/2017  .  Axonal neuropathy 03/21/2017  . Right sided weakness 03/01/2017  . Cigarette smoker 06/16/2016  . Dyspnea on exertion 06/15/2016  . Alzheimer disease 01/07/2014  . CSA (central sleep apnea) 10/05/2013  . Syncope 06/26/2013  . COPD GOLD II 06/25/2013  . BPH (benign prostatic hyperplasia) 05/18/2013  . Glaucoma 05/18/2013  . S/P right TH revision 05/14/2013  . Constipation 03/26/2013  . Osteoporosis 03/26/2013  . Expected blood loss anemia 03/23/2013  . S/P right THA, AA 03/20/2013  . Hyperlipidemia   . Erectile dysfunction   . History of asbestos exposure   . Nocturia   . History of shingles   . Dermatitis, atopic   . OA (osteoarthritis) of hip   . Arthritis   . Frequency of urination   . Lumbar scoliosis   . Malignant neoplasm of prostate ([place]) 06/07/2011  . 84 year old  gentleman with stage T3 adenocarcinoma prostate with Gleason score 4+4 and PSA of 10.3 05/11/2011    Bjorn Loser, PTA  08/24/17, 1:30 PM [facility] [address] [place], [place], [address] Phone: [phone]   Fax:  [phone]  Name: Derek Carr MRN: [id number] Date of Birth: 05/27/1931

## 2017-08-29 ENCOUNTER — Ambulatory Visit: Payer: Medicare HMO | Attending: Student | Admitting: Physical Therapy

## 2017-08-29 ENCOUNTER — Encounter: Payer: Self-pay | Admitting: Physical Therapy

## 2017-08-29 DIAGNOSIS — M6281 Muscle weakness (generalized): Secondary | ICD-10-CM | POA: Diagnosis not present

## 2017-08-29 DIAGNOSIS — R2681 Unsteadiness on feet: Secondary | ICD-10-CM | POA: Diagnosis not present

## 2017-08-29 DIAGNOSIS — R42 Dizziness and giddiness: Secondary | ICD-10-CM | POA: Insufficient documentation

## 2017-08-29 DIAGNOSIS — R2689 Other abnormalities of gait and mobility: Secondary | ICD-10-CM

## 2017-08-29 DIAGNOSIS — R262 Difficulty in walking, not elsewhere classified: Secondary | ICD-10-CM

## 2017-08-29 NOTE — Therapy (Signed)
[facility] [address] [place], [place], [address] Phone: [phone]   Fax:  [phone]  Physical Therapy Treatment  Patient Details  Name: Derek Carr MRN: [id number] Date of Birth: Nov 13, 1931 Referring Provider: Mechele Claude, PA   Encounter Date: 08/29/2017  PT End of Session - 08/29/17 1318    Visit Number  8    Number of Visits  27    Date for PT Re-Evaluation  10/26/17    Authorization Type  [employer]na Medicare & Generic commercial 2nd    PT Start Time  0945    PT Stop Time  1026    PT Time Calculation (min)  41 min    Equipment Utilized During Treatment  Gait belt    Activity Tolerance  Patient tolerated treatment well       Past Medical History:  Diagnosis Date  . Anemia   . Ankle fracture    left  . Atherosclerosis   . Complication of anesthesia    CONFUSION - Pt's family very concerned about this  . DDD (degenerative disc disease)   . Degenerative arthritis   . Dermatitis, atopic ARMS AND LEGS  . Diverticulosis   . Erectile dysfunction   . Frequency of urination   . Glaucoma   . History of asbestos exposure   . History of radiation therapy 8/19-13-10/18/11   prostate, 33 GY  . History of shingles 2012-- BILATERAL EYES--  NO RESIDUAL  . Hyperlipidemia   . Hypertension   . Inguinal hernia   . Lumbar scoliosis   . Nocturia   . OA (osteoarthritis) of hip RIGHT  . Prostate cancer ([place]) 05/11/2011   bx=Adenocarcinoma,gleason3+4=7, 4+4=8,PSA=10.30volume=45.7cc  . Renal cyst    bilateral  . Smokers' cough ([place])   . Stroke ([place])   . Thyroid cyst     Past Surgical History:  Procedure Laterality Date  . ATTEMPTED LEFT VATS/ LEFT THORACOTOMY/ RESECTION OF THE ENORMOUS, PROBABLE BRONCHOGENIC CYST  01-04-2005  DR Arlyce Dice  . CARDIAC CATHETERIZATION  02-24-2009  DR NASHER   MINOR CORONARY ARTERY IRREGULARITIES/ NORMAL LVSF/ EF 65-70%  . CATARACT EXTRACTION    . COLONOSCOPY W/ POLYPECTOMY    .  CYSTOSCOPY  11/15/2011   Procedure: CYSTOSCOPY;  Surgeon: Dutch Gray, MD;  Location: [facility];  Service: Urology;  Laterality: N/A;  no seeds seen in bladder  . esophageus cyst removal  YRS AGO  . [place]  . ORIF ANKLE FRACTURE Left 05/03/2017  . ORIF ANKLE FRACTURE Left 05/03/2017   Procedure: OPEN REDUCTION INTERNAL FIXATION (ORIF) BIMALLEOLAR  ANKLE FRACTURE;  Surgeon: Wylene Simmer, MD;  Location: [facility];  Service: Orthopedics;  Laterality: Left;  . PROSTATE BIOPSY  05/11/11   Adenocarcinoma (MD OFFICE)  . RADIOACTIVE SEED IMPLANT  11/15/2011   Procedure: RADIOACTIVE SEED IMPLANT;  Surgeon: Dutch Gray, MD;  Location: [facility];  Service: Urology;  Laterality: N/A;  Total number of seeds - 52  . REPAIR RIGHT INGUINAL HERNIA W/ MESH  09-10-1999  . TOTAL HIP ARTHROPLASTY Right 03/20/2013   Procedure: RIGHT TOTAL HIP ARTHROPLASTY ANTERIOR APPROACH;  Surgeon: [name], MD;  Location: [facility];  Service: Orthopedics;  Laterality: Right;  . TOTAL HIP REVISION Right 05/14/2013   Procedure: open reduction internal fixation REVISION RIGHT HIP ;  Surgeon: [name], MD;  Location: [facility];  Service: Orthopedics;  Laterality: Right;  . UMBILICAL HERNIA REPAIR  1998   epigastric  There were no vitals filed for this visit.  Subjective Assessment - 08/29/17 0938    Subjective  Pt did not wear the cam boot into therapy today.  Pt went out to church and out to eat this weekend without LOB still using the walker in the community.    Patient is accompained by:  Family member    Pertinent History  left ankle fracture, TIAs, DDD, Lumbar scoliosis, Glaucoma, cataract surgery, HTN, right THR 03/20/13 with revision 05/14/13, right femur fracture, dementia/Alzheimers    Limitations  Standing;Walking;House hold activities    Patient Stated Goals  Patient wants to improve his balance & walking    Currently in Pain?  No/denies                        [facility] Adult PT Treatment/Exercise - 08/29/17 0001      Transfers   Transfers  Stand Pivot Transfers    Stand Pivot Transfers  5: Supervision    Stand Pivot Transfer Details (indicate cue type and reason)  cues to keep inside walker when turning to sit vs. stepping outside of walker      Ambulation/Gait   Ambulation/Gait  Yes    Ambulation/Gait Assistance  4: Min guard;5: Supervision    Ambulation/Gait Assistance Details  no cam boot; cues for posture and foot clearance;    Ambulation Distance (Feet)  250 Feet    Assistive device  Straight cane    Gait Pattern  Step-through pattern;Decreased stance time - left;Decreased weight shift to left;Lateral hip instability;Wide base of support;Decreased stride length;Decreased step length - left;Poor foot clearance - right    Ambulation Surface  Indoor;Level      Exercises   Exercises  Knee/Hip      Knee/Hip Exercises: Supine   Bridges  Both;2 sets;10 reps    Straight Leg Raises  Strengthening;Both;2 sets;5 reps          Balance Exercises - 08/29/17 1002      Balance Exercises: Standing   Standing Eyes Opened  Other (comment) feet hip with apart; reaching to the ground then upper trunk rotations.   Stepping Strategy  Anterior;Lateral holding stagger stance working on posture;        PT Education - 08/29/17 1319    Education provided  Yes    Education Details  safety with transfer with RW.    Person(s) Educated  Patient    Methods  Explanation;Demonstration    Comprehension  Verbalized understanding;Returned demonstration;Need further instruction;Verbal cues required       PT Short Term Goals - 08/24/17 1313      PT SHORT TERM GOAL #1   Title  Pt will be independent with initial HEP exercises (All STGs Target Date 08/26/2017)    Baseline  Pt will be independent with initial HEP exercises; MET 08/22/17.    Time  30    Period  Days    Status  Achieved      PT SHORT TERM GOAL #2   Title   Patient ambulates 250' paved surfaces with cane with supervision.     Baseline  Patient ambulates 250' paved surfaces with cane with Min Guard. Pt had cam boot on L foot due to pain previous day; 08/24/17.    Time  30    Period  Days    Status  Partially Met      PT SHORT TERM GOAL #3   Title  Timed Up & Go with cane <18sec  with supervision.     Baseline  Timed Up & Go with cane 31.78 with supervision. Pt had cam boot on L foot due to pain previous day; 08/24/17.    Time  30    Period  Days    Status  Not Met      PT SHORT TERM GOAL #4   Title  Patient standing balance reaching 10" anteriorly, to floor and looks over shoulders without balance loss with supervision.     Baseline  Patient standing balance reaching 10" anteriorly, to floor and looks over shoulders without balance loss with Min A. Pt had cam boot on L foot due to pain previous day; 08/24/17.    Time  30    Period  Days    Status  Not Met        PT Long Term Goals - 07/28/17 1215      PT LONG TERM GOAL #1   Title  Pt will demonstrate independence with ongoing HEP program and fitness plan (All LTGs Target Date 10/26/2017)    Time  90    Period  Days    Status  New    Target Date  10/26/17      PT LONG TERM GOAL #2   Title  Patient ambulates Modified Independent 500' outdoors including grass, pavement, curbs, ramps with cane to improve community ambulatory skills    Time  90    Period  Days    Status  New    Target Date  10/26/17      PT LONG TERM GOAL #3   Title  Berg Balance >/= 45/56 to indicate lower fall risk.     Time  90    Period  Days    Status  New    Target Date  10/26/17      PT LONG TERM GOAL #4   Title  Timed Up-Go time with cane <13.5 seconds to indicate lower fall risk.     Time  90    Period  Days    Status  New    Target Date  10/26/17            Plan - 08/29/17 1308    Clinical Impression Statement  Pt did not have CAM boot today;L  foot feeling better than last week.  Pt was  challenged with LE strengthening exercises on mat; required cues for technique.  Balance training worked on stepping strategies and stagger stance holding position with cues for posture; without device, min guard level. Gait training with SPC, requires cues for posture and foot clearance, right foot drag multple times during session.                                                                                                                        Rehab Potential  Good    PT Frequency  2x / week    PT Duration  Other (comment) 13 weeks (90 days)  PT Treatment/Interventions  Gait training;Functional mobility training;Neuromuscular re-education;Balance training;Therapeutic exercise;Therapeutic activities;Patient/family education;ADLs/Self Care Home Management;Stair training;Orthotic Fit/Training;Manual techniques;Vestibular    PT Next Visit Plan  dynamic standing  balance; LE strengthening; gait with SPC.    Consulted and Agree with Plan of Care  Patient       Patient will benefit from skilled therapeutic intervention in order to improve the following deficits and impairments:  Abnormal gait, Decreased activity tolerance, Decreased balance, Decreased endurance, Decreased knowledge of use of DME, Decreased mobility, Decreased range of motion, Decreased safety awareness, Decreased strength, Impaired flexibility, Postural dysfunction  Visit Diagnosis: Muscle weakness (generalized)  Unsteadiness on feet  Other abnormalities of gait and mobility  Difficulty in walking, not elsewhere classified     Problem List Patient Active Problem List   Diagnosis Date Noted  . Post-operative pain   . Dementia without behavioral disturbance   . TIA (transient ischemic attack)   . Reactive hypertension   . Acute blood loss anemia   . Bimalleolar fracture of left ankle 05/03/2017  . Axonal neuropathy 03/21/2017  . Right sided weakness 03/01/2017  . Cigarette smoker 06/16/2016  . Dyspnea on exertion  06/15/2016  . Alzheimer disease 01/07/2014  . CSA (central sleep apnea) 10/05/2013  . Syncope 06/26/2013  . COPD GOLD II 06/25/2013  . BPH (benign prostatic hyperplasia) 05/18/2013  . Glaucoma 05/18/2013  . S/P right TH revision 05/14/2013  . Constipation 03/26/2013  . Osteoporosis 03/26/2013  . Expected blood loss anemia 03/23/2013  . S/P right THA, AA 03/20/2013  . Hyperlipidemia   . Erectile dysfunction   . History of asbestos exposure   . Nocturia   . History of shingles   . Dermatitis, atopic   . OA (osteoarthritis) of hip   . Arthritis   . Frequency of urination   . Lumbar scoliosis   . Malignant neoplasm of prostate ([place]) 06/07/2011  . 84 year old gentleman with stage T3 adenocarcinoma prostate with Gleason score 4+4 and PSA of 10.3 05/11/2011    Bjorn Loser, PTA  08/29/17, 1:25 PM [facility] [address] [place], [place], [address] Phone: [phone]   Fax:  [phone]  Name: KENTARO ALEWINE MRN: [id number] Date of Birth: [birth year]

## 2017-08-30 NOTE — Addendum Note (Signed)
Addended by: [name] on: 08/30/2017 06:19 PM   Modules accepted: Orders

## 2017-08-30 NOTE — Procedures (Signed)
NAME:  Derek Carr                                                                DOB: 04-Jan-1932 MEDICAL RECORD Number:    [id number]                               DOS:  08/24/2017 REFERRING PHYSICIAN: Antony Contras, MD and Venancio Poisson, NP STUDY PERFORMED: Home Sleep Study on watch pat  HISTORY: Derek Carr is an 84 y.o. male stroke patient, who carries a diagnosis of sleep apnea and used a CPAP but reported trouble with his CPAP mask.  The patient was unable to use his CPAP for the last year, according to his daughter.  He has vascular dementia, a high fall risk, frequent nocturia.  She felt that the CPAP was a nuisance for him -something he struggled with at night to take off and put back on. His daughter has noticed excessive daytime sleepiness and possible REM behavior. He can fall asleep even in the middle of conversations. He spends many hours in bed, not all in sleep.  For now PT/ OT continues to come to the house for the first month following a fracture to his left ankle. There have been no worsening stroke symptoms or TIA symptoms since his last hospitalization in February 2019.   Epworth Sleepiness Score endorsed at 20/24 points, FSS at 47/63 points, Geriatric Depression score at 4/ 15 points.  BMI: 22.7  STUDY RESULTS:  Total Recording Time: 10 hours 4 min., valid test time 8h and 38 min.  Total Apnea/Hypopnea Index (AHI):  37.9 /h with 28.5/h of these being central apnea, RDI: 38.2 /h   Average Oxygen Saturation:  97%; Lowest Oxygen Desaturation: 91 %  Average Heart Rate:  60 bpm (between 41 and 139 bpm). IMPRESSION: Complex, dominantly Central Sleep Apnea without hypoxemia.  There was tachy-bradycardia. NREM AHI was higher than REM sleep AHI.  Highest AHI noted in supine sleep (53.4/h) and lowest on the left side (AHI was 4.2/h). RECOMMENDATION: Attended CPAP /BiPAP titration for this patient with stroke related memory deficits, central sleep apnea and irregular heart rate.    I certify that I have reviewed the raw data recording prior to the issuance of this report in accordance with the standards of the American Academy of Sleep Medicine (AASM). [name], M.D.    08-30-2017    Medical Director of [facility] at [facility], accredited by the AASM. Diplomat of the ABPN and ABSM.

## 2017-09-01 ENCOUNTER — Encounter: Payer: Self-pay | Admitting: Physical Therapy

## 2017-09-01 ENCOUNTER — Ambulatory Visit: Payer: Medicare HMO | Admitting: Physical Therapy

## 2017-09-01 ENCOUNTER — Telehealth: Payer: Self-pay | Admitting: Neurology

## 2017-09-01 DIAGNOSIS — R2689 Other abnormalities of gait and mobility: Secondary | ICD-10-CM | POA: Diagnosis not present

## 2017-09-01 DIAGNOSIS — M6281 Muscle weakness (generalized): Secondary | ICD-10-CM | POA: Diagnosis not present

## 2017-09-01 DIAGNOSIS — R262 Difficulty in walking, not elsewhere classified: Secondary | ICD-10-CM

## 2017-09-01 DIAGNOSIS — R2681 Unsteadiness on feet: Secondary | ICD-10-CM | POA: Diagnosis not present

## 2017-09-01 DIAGNOSIS — R42 Dizziness and giddiness: Secondary | ICD-10-CM | POA: Diagnosis not present

## 2017-09-01 NOTE — Therapy (Signed)
[facility] [address] [place], [place], [address] Phone: [phone]   Fax:  [phone]  Physical Therapy Treatment  Patient Details  Name: Derek Carr MRN: [id number] Date of Birth: November 26, 1931 Referring Provider: Mechele Claude, PA   Encounter Date: 09/01/2017  PT End of Session - 09/01/17 0937    Visit Number  9    Number of Visits  27    Date for PT Re-Evaluation  10/26/17    Authorization Type  Aetna Medicare & Generic commercial 2nd    PT Start Time  0935    PT Stop Time  1015    PT Time Calculation (min)  40 min    Equipment Utilized During Treatment  Gait belt    Activity Tolerance  Patient tolerated treatment well;No increased pain    Behavior During Therapy  WFL for tasks assessed/performed       Past Medical History:  Diagnosis Date  . Anemia   . Ankle fracture    left  . Atherosclerosis   . Complication of anesthesia    CONFUSION - Pt's family very concerned about this  . DDD (degenerative disc disease)   . Degenerative arthritis   . Dermatitis, atopic ARMS AND LEGS  . Diverticulosis   . Erectile dysfunction   . Frequency of urination   . Glaucoma   . History of asbestos exposure   . History of radiation therapy 8/19-13-10/18/11   prostate, 1 GY  . History of shingles 2012-- BILATERAL EYES--  NO RESIDUAL  . Hyperlipidemia   . Hypertension   . Inguinal hernia   . Lumbar scoliosis   . Nocturia   . OA (osteoarthritis) of hip RIGHT  . Prostate cancer ([place]) 05/11/2011   bx=Adenocarcinoma,gleason3+4=7, 4+4=8,PSA=10.30volume=45.7cc  . Renal cyst    bilateral  . Smokers' cough ([place])   . Stroke ([place])   . Thyroid cyst     Past Surgical History:  Procedure Laterality Date  . ATTEMPTED LEFT VATS/ LEFT THORACOTOMY/ RESECTION OF THE ENORMOUS, PROBABLE BRONCHOGENIC CYST  01-04-2005  DR Arlyce Dice  . CARDIAC CATHETERIZATION  02-24-2009  DR NASHER   MINOR CORONARY ARTERY IRREGULARITIES/ NORMAL  LVSF/ EF 65-70%  . CATARACT EXTRACTION    . COLONOSCOPY W/ POLYPECTOMY    . CYSTOSCOPY  11/15/2011   Procedure: CYSTOSCOPY;  Surgeon: Dutch Gray, MD;  Location: [facility];  Service: Urology;  Laterality: N/A;  no seeds seen in bladder  . esophageus cyst removal  YRS AGO  . [place]  . ORIF ANKLE FRACTURE Left 05/03/2017  . ORIF ANKLE FRACTURE Left 05/03/2017   Procedure: OPEN REDUCTION INTERNAL FIXATION (ORIF) BIMALLEOLAR  ANKLE FRACTURE;  Surgeon: Wylene Simmer, MD;  Location: [facility];  Service: Orthopedics;  Laterality: Left;  . PROSTATE BIOPSY  05/11/11   Adenocarcinoma ([facility] OFFICE)  . RADIOACTIVE SEED IMPLANT  11/15/2011   Procedure: RADIOACTIVE SEED IMPLANT;  Surgeon: Dutch Gray, MD;  Location: [facility];  Service: Urology;  Laterality: N/A;  Total number of seeds - 52  . REPAIR RIGHT INGUINAL HERNIA W/ MESH  09-10-1999  . TOTAL HIP ARTHROPLASTY Right 03/20/2013   Procedure: RIGHT TOTAL HIP ARTHROPLASTY ANTERIOR APPROACH;  Surgeon: [name], MD;  [facility];  Service: Orthopedics;  Laterality: Right;  . TOTAL HIP REVISION Right 05/14/2013   Procedure: open reduction internal fixation REVISION RIGHT HIP ;  Surgeon: [name], MD;  [facility];  Service: Orthopedics;  Laterality:  Right;  . UMBILICAL HERNIA REPAIR  1998   epigastric    There were no vitals filed for this visit.  Subjective Assessment - 09/01/17 0937    Subjective  No new complaints. To therapy with bil sneakers on today. Denies any pain or falls.     Pertinent History  left ankle fracture, TIAs, DDD, Lumbar scoliosis, Glaucoma, cataract surgery, HTN, right THR 03/20/13 with revision 05/14/13, right femur fracture, dementia/Alzheimers    Limitations  Standing;Walking;House hold activities    Patient Stated Goals  Patient wants to improve his balance & walking    Currently in Pain?  No/denies    Pain Score  0-No pain         09/01/17 0938  Transfers   Transfers Sit to Stand;Stand to Sit  Sit to Stand 5: Supervision;From chair/3-in-1;With armrests;Without upper extremity assist;With upper extremity assist  Stand to Sit 5: Supervision;With upper extremity assist;With armrests;To chair/3-in-1  Ambulation/Gait  Ambulation/Gait Yes  Ambulation/Gait Assistance 5: Supervision;4: Min guard;4: Min assist  Ambulation/Gait Assistance Details with RW; cues on posture and to stay closer to walker with gait. with cane: cues on posture, step length and cane placement   Ambulation Distance (Feet) 110 Feet (x2 with RW; 20 x2 with straight cane)  Assistive device Rolling walker;Straight cane (with rubber quad tip)  Gait Pattern Step-through pattern;Decreased stance time - left;Decreased weight shift to left;Lateral hip instability;Wide base of support;Decreased stride length;Decreased step length - left;Poor foot clearance - right  Ambulation Surface Level;Indoor  High Level Balance  High Level Balance Activities Marching forwards;Marching backwards;Tandem walking  High Level Balance Comments in parallel bars with light UE support on bars: 3 laps each with verbal/visual cues on posture and ex form/technique.                  Knee/Hip Exercises: Supine  Bridges AROM;Strengthening;Both;10 reps;Limitations  Bridges Limitations 5 sec holds each rep.   Straight Leg Raises AROM;AAROM;Strengthening;Both;1 set;10 reps;Limitations  Straight Leg Raises Limitations assist needed with bil LE's for slow, controlled movements and to maintain knee extension with each rep.   Other Supine Knee/Hip Exercises clamshell with green band resistance: cues on form and technique for 10 reps.   Other Supine Knee/Hip Exercises hip fall outs with green band resistance x 10 reps each leg with assist to keep non moving leg stable.         Balance Exercises - 09/01/17 0958      Balance Exercises: Standing   Balance Beam  standing across blue foam beam: alternating fwd stepping  to floor/back onto beam, then alternating bwd stepping to floor/back onto beam. 8-10 reps each bil LE's with bil UE support on bars. cues on posture/to look fwd not down, for increased step length and for increased step height. min guard to min assist for balance.           PT Short Term Goals - 08/24/17 1313      PT SHORT TERM GOAL #1   Title  Pt will be independent with initial HEP exercises (All STGs Target Date 08/26/2017)    Baseline  Pt will be independent with initial HEP exercises; MET 08/22/17.    Time  30    Period  Days    Status  Achieved      PT SHORT TERM GOAL #2   Title  Patient ambulates 250' paved surfaces with cane with supervision.     Baseline  Patient ambulates 250' paved surfaces with cane with Min Guard.  Pt had cam boot on L foot due to pain previous day; 08/24/17.    Time  30    Period  Days    Status  Partially Met      PT SHORT TERM GOAL #3   Title  Timed Up & Go with cane <18sec with supervision.     Baseline  Timed Up & Go with cane 31.78 with supervision. Pt had cam boot on L foot due to pain previous day; 08/24/17.    Time  30    Period  Days    Status  Not Met      PT SHORT TERM GOAL #4   Title  Patient standing balance reaching 10" anteriorly, to floor and looks over shoulders without balance loss with supervision.     Baseline  Patient standing balance reaching 10" anteriorly, to floor and looks over shoulders without balance loss with Min A. Pt had cam boot on L foot due to pain previous day; 08/24/17.    Time  30    Period  Days    Status  Not Met        PT Long Term Goals - 07/28/17 1215      PT LONG TERM GOAL #1   Title  Pt will demonstrate independence with ongoing HEP program and fitness plan (All LTGs Target Date 10/26/2017)    Time  90    Period  Days    Status  New    Target Date  10/26/17      PT LONG TERM GOAL #2   Title  Patient ambulates Modified Independent 500' outdoors including grass, pavement, curbs, ramps with cane to  improve community ambulatory skills    Time  90    Period  Days    Status  New    Target Date  10/26/17      PT LONG TERM GOAL #3   Title  Berg Balance >/= 45/56 to indicate lower fall risk.     Time  90    Period  Days    Status  New    Target Date  10/26/17      PT LONG TERM GOAL #4   Title  Timed Up-Go time with cane <13.5 seconds to indicate lower fall risk.     Time  90    Period  Days    Status  New    Target Date  10/26/17          Plan - 09/01/17 1610    Clinical Impression Statement  Today's skilled session continued to focus on LE strengthening, balance and gait with cane. No issues reported with session. Pt is progressing toward goals and should benefit from continued PT to progress toward unmet goals.     Rehab Potential  Good    PT Frequency  2x / week    PT Duration  Other (comment)    PT Treatment/Interventions  Gait training;Functional mobility training;Neuromuscular re-education;Balance training;Therapeutic exercise;Therapeutic activities;Patient/family education;ADLs/Self Care Home Management;Stair training;Orthotic Fit/Training;Manual techniques;Vestibular    PT Next Visit Plan  dynamic standing  balance; LE strengthening; gait with SPC.    Consulted and Agree with Plan of Care  Patient       Patient will benefit from skilled therapeutic intervention in order to improve the following deficits and impairments:  Abnormal gait, Decreased activity tolerance, Decreased balance, Decreased endurance, Decreased knowledge of use of DME, Decreased mobility, Decreased range of motion, Decreased safety awareness, Decreased strength, Impaired flexibility, Postural dysfunction  Visit Diagnosis: Muscle weakness (generalized)  Unsteadiness on feet  Other abnormalities of gait and mobility  Difficulty in walking, not elsewhere classified  Dizziness and giddiness     Problem List Patient Active Problem List   Diagnosis Date Noted  . Post-operative pain   .  Dementia without behavioral disturbance   . TIA (transient ischemic attack)   . Reactive hypertension   . Acute blood loss anemia   . Bimalleolar fracture of left ankle 05/03/2017  . Axonal neuropathy 03/21/2017  . Right sided weakness 03/01/2017  . Cigarette smoker 06/16/2016  . Dyspnea on exertion 06/15/2016  . Alzheimer disease 01/07/2014  . CSA (central sleep apnea) 10/05/2013  . Syncope 06/26/2013  . COPD GOLD II 06/25/2013  . BPH (benign prostatic hyperplasia) 05/18/2013  . Glaucoma 05/18/2013  . S/P right TH revision 05/14/2013  . Constipation 03/26/2013  . Osteoporosis 03/26/2013  . Expected blood loss anemia 03/23/2013  . S/P right THA, AA 03/20/2013  . Hyperlipidemia   . Erectile dysfunction   . History of asbestos exposure   . Nocturia   . History of shingles   . Dermatitis, atopic   . OA (osteoarthritis) of hip   . Arthritis   . Frequency of urination   . Lumbar scoliosis   . Malignant neoplasm of prostate ([place]) 06/07/2011  . 84 year old gentleman with stage T3 adenocarcinoma prostate with Gleason score 4+4 and PSA of 10.3 05/11/2011    Willow Ora, PTA, [facility] [address], [place], [place] [address] [phone] 09/01/17, 12:59 PM   Name: Derek Carr MRN: [id number] Date of Birth: November 08, 1931

## 2017-09-01 NOTE — Telephone Encounter (Signed)
-----   Message from [name], MD sent at 08/30/2017  6:19 PM EDT ----- Excessive daytime sleepiness correlates with high central apnea index. The cardiac rate variability is likely contributing to fatigue, lightheadedness, all based in cerebrovascular disease/ neurodegenerative disease. IMPRESSION: Complex, dominantly Central Sleep Apnea without clinically relevant hypoxemia.  There was tachy-bradycardia. NREM AHI was higher than REM sleep AHI.  Highest AHI noted in supine sleep (53.4/h) and lowest on the left side (AHI was 4.2/h). RECOMMENDATION: Attended CPAP /BiPAP titration for this patient  with stroke related memory deficits, central sleep apnea and  irregular heart rate.

## 2017-09-01 NOTE — Telephone Encounter (Signed)
Called patient to discuss sleep study results. No answer at this time. LVM for the patient to call back.   

## 2017-09-01 NOTE — Telephone Encounter (Signed)
Called the patient's daughter back and informed her of the sleep study results. I called pt. I advised pt that Dr. Brett Fairy reviewed their sleep study results and found that the patient has sleep apnea and recommends that pt be treated with a cpap. Dr. Brett Fairy recommends that pt return for a repeat sleep study in order to properly titrate the cpap and ensure a good mask fit. Pt is agreeable to returning for a titration study. I advised pt that our sleep lab will file with pt's insurance and call pt to schedule the sleep study when we hear back from the pt's insurance regarding coverage of this sleep study. Pt verbalized understanding of results. Pt had no questions at this time but was encouraged to call back if questions arise.

## 2017-09-01 NOTE — Telephone Encounter (Signed)
Pt daughter on DPR ([place],[phone]) has called RN Myriam Jacobson back, she is asking for a call back

## 2017-09-05 ENCOUNTER — Ambulatory Visit: Payer: Medicare HMO | Admitting: Physical Therapy

## 2017-09-05 ENCOUNTER — Encounter: Payer: Self-pay | Admitting: Physical Therapy

## 2017-09-05 DIAGNOSIS — M6281 Muscle weakness (generalized): Secondary | ICD-10-CM | POA: Diagnosis not present

## 2017-09-05 DIAGNOSIS — R262 Difficulty in walking, not elsewhere classified: Secondary | ICD-10-CM

## 2017-09-05 DIAGNOSIS — R42 Dizziness and giddiness: Secondary | ICD-10-CM | POA: Diagnosis not present

## 2017-09-05 DIAGNOSIS — R2689 Other abnormalities of gait and mobility: Secondary | ICD-10-CM

## 2017-09-05 DIAGNOSIS — L308 Other specified dermatitis: Secondary | ICD-10-CM | POA: Diagnosis not present

## 2017-09-05 DIAGNOSIS — R2681 Unsteadiness on feet: Secondary | ICD-10-CM | POA: Diagnosis not present

## 2017-09-05 NOTE — Therapy (Signed)
[facility] [address] [place], [place], [address] Phone: [phone]   Fax:  [phone]  Physical Therapy Treatment  Patient Details  Name: Derek Carr MRN: [id number] Date of Birth: [birth year] Referring Provider: Mechele Claude, PA   Encounter Date: 09/05/2017  PT End of Session - 09/05/17 1105    Visit Number  10    Number of Visits  27    Date for PT Re-Evaluation  10/26/17    Authorization Type  [employer] & Generic commercial 2nd    PT Start Time  1022    PT Stop Time  1104    PT Time Calculation (min)  42 min    Equipment Utilized During Treatment  Gait belt    Activity Tolerance  Patient tolerated treatment well;No increased pain    Behavior During Therapy  WFL for tasks assessed/performed       Past Medical History:  Diagnosis Date  . Anemia   . Ankle fracture    left  . Atherosclerosis   . Complication of anesthesia    CONFUSION - Pt's family very concerned about this  . DDD (degenerative disc disease)   . Degenerative arthritis   . Dermatitis, atopic ARMS AND LEGS  . Diverticulosis   . Erectile dysfunction   . Frequency of urination   . Glaucoma   . History of asbestos exposure   . History of radiation therapy 8/19-13-10/18/11   prostate, 72 GY  . History of shingles 2012-- BILATERAL EYES--  NO RESIDUAL  . Hyperlipidemia   . Hypertension   . Inguinal hernia   . Lumbar scoliosis   . Nocturia   . OA (osteoarthritis) of hip RIGHT  . Prostate cancer ([place]) 05/11/2011   bx=Adenocarcinoma,gleason3+4=7, 4+4=8,PSA=10.30volume=45.7cc  . Renal cyst    bilateral  . Smokers' cough ([place])   . Stroke ([place])   . Thyroid cyst     Past Surgical History:  Procedure Laterality Date  . ATTEMPTED LEFT VATS/ LEFT THORACOTOMY/ RESECTION OF THE ENORMOUS, PROBABLE BRONCHOGENIC CYST  01-04-2005  DR Arlyce Dice  . CARDIAC CATHETERIZATION  02-24-2009  DR NASHER   MINOR CORONARY ARTERY IRREGULARITIES/ NORMAL  LVSF/ EF 65-70%  . CATARACT EXTRACTION    . COLONOSCOPY W/ POLYPECTOMY    . CYSTOSCOPY  11/15/2011   Procedure: CYSTOSCOPY;  Surgeon: Dutch Gray, MD;  Location: [facility];  Service: Urology;  Laterality: N/A;  no seeds seen in bladder  . esophageus cyst removal  YRS AGO  . [place]  . ORIF ANKLE FRACTURE Left 05/03/2017  . ORIF ANKLE FRACTURE Left 05/03/2017   Procedure: OPEN REDUCTION INTERNAL FIXATION (ORIF) BIMALLEOLAR  ANKLE FRACTURE;  Surgeon: Wylene Simmer, MD;  Location: [facility];  Service: Orthopedics;  Laterality: Left;  . PROSTATE BIOPSY  05/11/11   Adenocarcinoma (MD OFFICE)  . RADIOACTIVE SEED IMPLANT  11/15/2011   Procedure: RADIOACTIVE SEED IMPLANT;  Surgeon: Dutch Gray, MD;  Location: [facility];  Service: Urology;  Laterality: N/A;  Total number of seeds - 52  . REPAIR RIGHT INGUINAL HERNIA W/ MESH  09-10-1999  . TOTAL HIP ARTHROPLASTY Right 03/20/2013   Procedure: RIGHT TOTAL HIP ARTHROPLASTY ANTERIOR APPROACH;  Surgeon: [name], MD;  Location: [facility];  Service: Orthopedics;  Laterality: Right;  . TOTAL HIP REVISION Right 05/14/2013   Procedure: open reduction internal fixation REVISION RIGHT HIP ;  Surgeon: [name], MD;  Location: [facility];  Service: Orthopedics;  Laterality:  Right;  . UMBILICAL HERNIA REPAIR  1998   epigastric    There were no vitals filed for this visit.  Subjective Assessment - 09/05/17 1025    Subjective  No new complaints. To therapy with bil sneakers on today. Denies any pain or falls.     Pertinent History  left ankle fracture, TIAs, DDD, Lumbar scoliosis, Glaucoma, cataract surgery, HTN, right THR 03/20/13 with revision 05/14/13, right femur fracture, dementia/Alzheimers    Limitations  Standing;Walking;House hold activities    Patient Stated Goals  Patient wants to improve his balance & walking    Currently in Pain?  No/denies                       [facility] Adult PT  Treatment/Exercise - 09/05/17 0001      Transfers   Transfers  Sit to Stand;Stand to Sit    Sit to Stand  5: Supervision;From chair/3-in-1;With armrests;Without upper extremity assist;With upper extremity assist    Stand to Sit  5: Supervision;With upper extremity assist;With armrests;To chair/3-in-1    Stand to Sit Details (indicate cue type and reason)  verbal cues for safe use of DME/AE    Stand to Sit Details  cues to keep inside waalker      Ambulation/Gait   Ambulation/Gait  Yes    Ambulation/Gait Assistance  5: Supervision;4: Min guard    Ambulation/Gait Assistance Details  occasional catching right foot on ground, cues for posture, and bilateral foot clearance.                           Ambulation Distance (Feet)  300 Feet    Assistive device  Straight cane    Gait Pattern  Step-through pattern;Decreased stance time - left;Decreased weight shift to left;Lateral hip instability;Wide base of support;Decreased stride length;Decreased step length - left;Poor foot clearance - right    Ambulation Surface  Level;Indoor      Exercises   Exercises  Knee/Hip      Knee/Hip Exercises: Supine   Bridges  AROM;Strengthening;Both;10 reps;Limitations;2 sets    Bridges Limitations  5 sec holds each rep.     Straight Leg Raises  AROM;AAROM;Strengthening;Both;10 reps;Limitations;2 sets    Straight Leg Raises Limitations  assist needed with bil LE's for slow, controlled movements and to maintain knee extension with each rep.       Knee/Hip Exercises: Sidelying   Clams  10x2 cues for body mechanics          Balance Exercises - 09/05/17 1051      Balance Exercises: Standing   Retro Gait  Other reps (comment)   with cane, cues for step length, and lifting heels initially   Marching Limitations  x10 in- place; cues for greater hip ROM, 1 UE support    Heel Raises Limitations  heel/toe raises x10, 1 UE support        PT Education - 09/05/17 1137    Education provided  Yes    Education  Details  Repeated safety with transfers with RW.     Person(s) Educated  Patient    Methods  Explanation    Comprehension  Verbalized understanding;Need further instruction       PT Short Term Goals - 08/24/17 1313      PT SHORT TERM GOAL #1   Title  Pt will be independent with initial HEP exercises (All STGs Target Date 08/26/2017)    Baseline  Pt will be  independent with initial HEP exercises; MET 08/22/17.    Time  30    Period  Days    Status  Achieved      PT SHORT TERM GOAL #2   Title  Patient ambulates 250' paved surfaces with cane with supervision.     Baseline  Patient ambulates 250' paved surfaces with cane with Min Guard. Pt had cam boot on L foot due to pain previous day; 08/24/17.    Time  30    Period  Days    Status  Partially Met      PT SHORT TERM GOAL #3   Title  Timed Up & Go with cane <18sec with supervision.     Baseline  Timed Up & Go with cane 31.78 with supervision. Pt had cam boot on L foot due to pain previous day; 08/24/17.    Time  30    Period  Days    Status  Not Met      PT SHORT TERM GOAL #4   Title  Patient standing balance reaching 10" anteriorly, to floor and looks over shoulders without balance loss with supervision.     Baseline  Patient standing balance reaching 10" anteriorly, to floor and looks over shoulders without balance loss with Min A. Pt had cam boot on L foot due to pain previous day; 08/24/17.    Time  30    Period  Days    Status  Not Met        PT Long Term Goals - 07/28/17 1215      PT LONG TERM GOAL #1   Title  Pt will demonstrate independence with ongoing HEP program and fitness plan (All LTGs Target Date 10/26/2017)    Time  90    Period  Days    Status  New    Target Date  10/26/17      PT LONG TERM GOAL #2   Title  Patient ambulates Modified Independent 500' outdoors including grass, pavement, curbs, ramps with cane to improve community ambulatory skills    Time  90    Period  Days    Status  New    Target Date   10/26/17      PT LONG TERM GOAL #3   Title  Berg Balance >/= 45/56 to indicate lower fall risk.     Time  90    Period  Days    Status  New    Target Date  10/26/17      PT LONG TERM GOAL #4   Title  Timed Up-Go time with cane <13.5 seconds to indicate lower fall risk.     Time  90    Period  Days    Status  New    Target Date  10/26/17            Plan - 09/05/17 1120    Clinical Impression Statement  Skilled session focused on LE strengthening, dynamic standing balance, and gait with cane.  Pt requires UE support for dynamic standing balance and continues to require occasional min guard with gait using cane due to instability and occasionally catching right foot on ground.  Pt requested seated rest after walking 300 ft due to fatigue.  Rehab Potential  Good    PT Frequency  2x / week    PT Duration  Other (comment)   13 weeks (90 days)   PT Treatment/Interventions  Gait training;Functional mobility training;Neuromuscular re-education;Balance training;Therapeutic exercise;Therapeutic activities;Patient/family education;ADLs/Self Care Home Management;Stair training;Orthotic Fit/Training;Manual techniques;Vestibular    PT Next Visit Plan  Activity tolerance training; dynamic standing  balance; LE strengthening; gait with SPC.    Consulted and Agree with Plan of Care  Patient       Patient will benefit from skilled therapeutic intervention in order to improve the following deficits and impairments:  Abnormal gait, Decreased activity tolerance, Decreased balance, Decreased endurance, Decreased knowledge of use of DME, Decreased mobility, Decreased range of motion, Decreased safety awareness, Decreased strength, Impaired flexibility, Postural dysfunction  Visit Diagnosis: Muscle weakness (generalized)  Unsteadiness on feet  Other abnormalities of gait and mobility  Difficulty in walking, not elsewhere  classified     Problem List Patient Active Problem List   Diagnosis Date Noted  . Post-operative pain   . Dementia without behavioral disturbance   . TIA (transient ischemic attack)   . Reactive hypertension   . Acute blood loss anemia   . Bimalleolar fracture of left ankle 05/03/2017  . Axonal neuropathy 03/21/2017  . Right sided weakness 03/01/2017  . Cigarette smoker 06/16/2016  . Dyspnea on exertion 06/15/2016  . Alzheimer disease 01/07/2014  . CSA (central sleep apnea) 10/05/2013  . Syncope 06/26/2013  . COPD GOLD II 06/25/2013  . BPH (benign prostatic hyperplasia) 05/18/2013  . Glaucoma 05/18/2013  . S/P right TH revision 05/14/2013  . Constipation 03/26/2013  . Osteoporosis 03/26/2013  . Expected blood loss anemia 03/23/2013  . S/P right THA, AA 03/20/2013  . Hyperlipidemia   . Erectile dysfunction   . History of asbestos exposure   . Nocturia   . History of shingles   . Dermatitis, atopic   . OA (osteoarthritis) of hip   . Arthritis   . Frequency of urination   . Lumbar scoliosis   . Malignant neoplasm of prostate ([place]) 06/07/2011  . 84 year old gentleman with stage T3 adenocarcinoma prostate with Gleason score 4+4 and PSA of 10.3 05/11/2011    Bjorn Loser, PTA  09/05/17, 1:20 PM [facility] [address] [place], [place], [address] Phone: [phone]   Fax:  [phone]  Name: Derek Carr MRN: [id number] Date of Birth: 1931/01/29

## 2017-09-07 ENCOUNTER — Ambulatory Visit: Payer: Medicare HMO | Admitting: Physical Therapy

## 2017-09-07 ENCOUNTER — Encounter: Payer: Self-pay | Admitting: Physical Therapy

## 2017-09-07 DIAGNOSIS — R42 Dizziness and giddiness: Secondary | ICD-10-CM

## 2017-09-07 DIAGNOSIS — R262 Difficulty in walking, not elsewhere classified: Secondary | ICD-10-CM | POA: Diagnosis not present

## 2017-09-07 DIAGNOSIS — R2681 Unsteadiness on feet: Secondary | ICD-10-CM

## 2017-09-07 DIAGNOSIS — M6281 Muscle weakness (generalized): Secondary | ICD-10-CM

## 2017-09-07 DIAGNOSIS — R2689 Other abnormalities of gait and mobility: Secondary | ICD-10-CM | POA: Diagnosis not present

## 2017-09-08 DIAGNOSIS — Z471 Aftercare following joint replacement surgery: Secondary | ICD-10-CM | POA: Diagnosis not present

## 2017-09-08 DIAGNOSIS — G8918 Other acute postprocedural pain: Secondary | ICD-10-CM | POA: Diagnosis not present

## 2017-09-08 DIAGNOSIS — R531 Weakness: Secondary | ICD-10-CM | POA: Diagnosis not present

## 2017-09-08 DIAGNOSIS — G4731 Primary central sleep apnea: Secondary | ICD-10-CM | POA: Diagnosis not present

## 2017-09-08 DIAGNOSIS — R351 Nocturia: Secondary | ICD-10-CM | POA: Diagnosis not present

## 2017-09-08 DIAGNOSIS — S82842A Displaced bimalleolar fracture of left lower leg, initial encounter for closed fracture: Secondary | ICD-10-CM | POA: Diagnosis not present

## 2017-09-08 DIAGNOSIS — R0602 Shortness of breath: Secondary | ICD-10-CM | POA: Diagnosis not present

## 2017-09-08 NOTE — Therapy (Signed)
[facility] [address] [place], [place], [address] Phone: [phone]   Fax:  [phone]  Physical Therapy Treatment  Patient Details  Name: Derek Carr MRN: [id number] Date of Birth: 82-Oct-1933 Referring Provider: Mechele Claude, PA   Encounter Date: 09/07/2080    Past Medical History:  Diagnosis Date  . Anemia   . Ankle fracture    left  . Atherosclerosis   . Complication of anesthesia    CONFUSION - Pt's family very concerned about this  . DDD (degenerative disc disease)   . Degenerative arthritis   . Dermatitis, atopic ARMS AND LEGS  . Diverticulosis   . Erectile dysfunction   . Frequency of urination   . Glaucoma   . History of asbestos exposure   . History of radiation therapy 8/19-13-10/18/11   prostate, 42 GY  . History of shingles 2012-- BILATERAL EYES--  NO RESIDUAL  . Hyperlipidemia   . Hypertension   . Inguinal hernia   . Lumbar scoliosis   . Nocturia   . OA (osteoarthritis) of hip RIGHT  . Prostate cancer ([place]) 05/11/2011   bx=Adenocarcinoma,gleason3+4=7, 4+4=8,PSA=10.30volume=45.7cc  . Renal cyst    bilateral  . Smokers' cough ([place])   . Stroke ([place])   . Thyroid cyst     Past Surgical History:  Procedure Laterality Date  . ATTEMPTED LEFT VATS/ LEFT THORACOTOMY/ RESECTION OF THE ENORMOUS, PROBABLE BRONCHOGENIC CYST  01-04-2005  DR Arlyce Dice  . CARDIAC CATHETERIZATION  02-24-2009  DR NASHER   MINOR CORONARY ARTERY IRREGULARITIES/ NORMAL LVSF/ EF 65-70%  . CATARACT EXTRACTION    . COLONOSCOPY W/ POLYPECTOMY    . CYSTOSCOPY  11/15/2011   Procedure: CYSTOSCOPY;  Surgeon: Dutch Gray, MD;  Location: [facility];  Service: Urology;  Laterality: N/A;  no seeds seen in bladder  . esophageus cyst removal  YRS AGO  . [place]  . ORIF ANKLE FRACTURE Left 05/03/2017  . ORIF ANKLE FRACTURE Left 05/03/2017   Procedure: OPEN REDUCTION INTERNAL FIXATION (ORIF)  BIMALLEOLAR  ANKLE FRACTURE;  Surgeon: Wylene Simmer, MD;  Location: [facility];  Service: Orthopedics;  Laterality: Left;  . PROSTATE BIOPSY  05/11/11   Adenocarcinoma (MD OFFICE)  . RADIOACTIVE SEED IMPLANT  11/15/2011   Procedure: RADIOACTIVE SEED IMPLANT;  Surgeon: Dutch Gray, MD;  [facility];  Service: Urology;  Laterality: N/A;  Total number of seeds - 52  . REPAIR RIGHT INGUINAL HERNIA W/ MESH  09-10-1999  . TOTAL HIP ARTHROPLASTY Right 03/20/2013   Procedure: RIGHT TOTAL HIP ARTHROPLASTY ANTERIOR APPROACH;  Surgeon: [name], MD;  [facility];  Service: Orthopedics;  Laterality: Right;  . TOTAL HIP REVISION Right 05/14/2013   Procedure: open reduction internal fixation REVISION RIGHT HIP ;  Surgeon: [name], MD;  Location[facility];  Service: Orthopedics;  Laterality: Right;  . UMBILICAL HERNIA REPAIR  1998   epigastric    There were no vitals filed for this visit.  Subjective Assessment - 09/07/17 1020    Subjective  No falls or issues. Patient reports doing his HEP.    Pertinent History  left ankle fracture, TIAs, DDD, Lumbar scoliosis, Glaucoma, cataract surgery, HTN, right THR 03/20/13 with revision 05/14/13, right femur fracture, dementia/Alzheimers    Limitations  Standing;Walking;House hold activities    Patient Stated Goals  Patient wants to improve his balance & walking    Currently in Pain?  No/denies  [facility] Adult PT Treatment/Exercise - 09/07/17 1015      Transfers   Transfers  Sit to Stand;Stand to Sit    Sit to Stand  5: Supervision;From chair/3-in-1;With armrests;With upper extremity assist    Stand to Sit  5: Supervision;With upper extremity assist;With armrests;To chair/3-in-1    Stand to Sit Details (indicate cue type and reason)  Visual cues for safe use of DME/AE      Ambulation/Gait   Ambulation/Gait  Yes    Ambulation/Gait Assistance  5: Supervision    Ambulation/Gait Assistance Details   verbal & tactile cues to position at back of RW for upright posture & taking longer/fuller steps. PT reviewed cueing with his dtr at end of session.    Ambulation Distance (Feet)  300 Feet   300' X 2 RW & 100' X 2 cane   Assistive device  Straight cane;Rolling walker    Gait Pattern  Step-through pattern;Decreased stance time - left;Decreased weight shift to left;Lateral hip instability;Wide base of support;Decreased stride length;Decreased step length - left;Poor foot clearance - right    Ambulation Surface  Indoor;Level      Exercises   Exercises  Knee/Hip      Knee/Hip Exercises: Supine   Bridges  AROM;Strengthening;Both;10 reps;Limitations;2 sets    Bridges Limitations  5 sec holds each rep.     Straight Leg Raises  AROM;AAROM;Strengthening;Both;10 reps;Limitations;2 sets    Straight Leg Raises Limitations  assist needed with bil LE's for slow, controlled movements and to maintain knee extension with each rep.       Knee/Hip Exercises: Sidelying   Clams  10x2 cues for body mechanics          Balance Exercises - 09/07/17 1015      Balance Exercises: Standing   Standing Eyes Opened  Wide (BOA);Head turns;Foam/compliant surface;5 reps    Standing Eyes Closed  Wide (BOA);Foam/compliant surface;10 secs;3 reps    Stepping Strategy  Anterior;Posterior;Lateral;Foam/compliant surface;5 reps   stepping off, stabilizing & back on AirEx, alternating LEs   Rockerboard  Anterior/posterior;Lateral;Head turns;EO;5 reps;Other (comment)   wt shifts, stabilization & recovery   Retro Gait  Other reps (comment);Other (comment)   10' X 2 in parallel bars with intermittent UE support   Sidestepping  Other reps (comment);Other (comment)   10' X 2 in parallel bars with intermittent UE support   Heel Raises Limitations  heel/toe raises x10, 1 UE support   hold 2-3 seconds   Other Standing Exercises  braiding   10' X 2 in parallel bars with intermittent UE support         PT Short Term Goals  - 09/08/17 0948      PT SHORT TERM GOAL #1   Title  Pt will be independent with updated HEP exercises (All STGs Target Date 09/30/2017)    Baseline        Target Date  09/30/17      PT SHORT TERM GOAL #2   Title  Patient ambulates 250' paved surfaces with cane with supervision.     Baseline        Status  On-going    Target Date  09/30/17      PT SHORT TERM GOAL #3   Title  Timed Up & Go with cane <18sec with supervision.     Baseline        Status  On-going    Target Date  09/30/17      PT SHORT TERM GOAL #4   Title  Patient standing balance reaching 10" anteriorly, to floor and looks over shoulders without balance loss with supervision.     Baseline        Target Date  09/30/17      PT SHORT TERM GOAL #5   Title  Patient negotiates ramps & curbs with cane with min guard.     Status  New    Target Date  09/30/17        PT Long Term Goals - 09/07/17 1230      PT LONG TERM GOAL #1   Title  Pt will demonstrate independence with ongoing HEP program and fitness plan (All LTGs Target Date 10/26/2017)    Time  90    Period  Days    Status  On-going    Target Date  10/26/17      PT LONG TERM GOAL #2   Title  Patient ambulates Modified Independent 500' outdoors including grass, pavement, curbs, ramps with cane to improve community ambulatory skills    Time  90    Period  Days    Status  On-going    Target Date  10/26/17      PT LONG TERM GOAL #3   Title  Berg Balance >/= 45/56 to indicate lower fall risk.     Time  90    Period  Days    Status  On-going    Target Date  10/26/17      PT LONG TERM GOAL #4   Title  Timed Up-Go time with cane <13.5 seconds to indicate lower fall risk.     Time  90    Period  Days    Status  On-going    Target Date  10/26/17            Plan - 09/07/17 1200    Clinical Impression Statement  Today's skilled session focused on dynamic balance activities facilitating ankle, hip & step strategies and gait cues to improve technique  with RW & cane. Patient's memory, cognition requires multiple repetitions & increased time for learning.     Rehab Potential  Good    PT Frequency  2x / week    PT Duration  Other (comment)   13 weeks (90 days)   PT Treatment/Interventions  Gait training;Functional mobility training;Neuromuscular re-education;Balance training;Therapeutic exercise;Therapeutic activities;Patient/family education;ADLs/Self Care Home Management;Stair training;Orthotic Fit/Training;Manual techniques;Vestibular    PT Next Visit Plan  work towards updated STGs, Activity tolerance training; dynamic standing  balance; LE strengthening; gait with SPC.    Consulted and Agree with Plan of Care  Patient       Patient will benefit from skilled therapeutic intervention in order to improve the following deficits and impairments:  Abnormal gait, Decreased activity tolerance, Decreased balance, Decreased endurance, Decreased knowledge of use of DME, Decreased mobility, Decreased range of motion, Decreased safety awareness, Decreased strength, Impaired flexibility, Postural dysfunction  Visit Diagnosis: Muscle weakness (generalized)  Unsteadiness on feet  Other abnormalities of gait and mobility  Difficulty in walking, not elsewhere classified  Dizziness and giddiness     Problem List Patient Active Problem List   Diagnosis Date Noted  . Post-operative pain   . Dementia without behavioral disturbance   . TIA (transient ischemic attack)   . Reactive hypertension   . Acute blood loss anemia   . Bimalleolar fracture of left ankle 05/03/2017  . Axonal neuropathy 03/21/2017  . Right sided weakness 03/01/2017  . Cigarette smoker 06/16/2016  . Dyspnea on exertion 06/15/2016  .  Alzheimer disease 01/07/2014  . CSA (central sleep apnea) 10/05/2013  . Syncope 06/26/2013  . COPD GOLD II 06/25/2013  . BPH (benign prostatic hyperplasia) 05/18/2013  . Glaucoma 05/18/2013  . S/P right TH revision 05/14/2013  .  Constipation 03/26/2013  . Osteoporosis 03/26/2013  . Expected blood loss anemia 03/23/2013  . S/P right THA, AA 03/20/2013  . Hyperlipidemia   . Erectile dysfunction   . History of asbestos exposure   . Nocturia   . History of shingles   . Dermatitis, atopic   . OA (osteoarthritis) of hip   . Arthritis   . Frequency of urination   . Lumbar scoliosis   . Malignant neoplasm of prostate ([place]) 06/07/2011  . 84 year old gentleman with stage T3 adenocarcinoma prostate with Gleason score 4+4 and PSA of 10.3 05/11/2011    Lateia Fraser PT, DPT 09/08/2017, 9:54 AM  [facility] [address] [place], [place], [address] Phone: [phone]   Fax:  [phone]  Name: DOSSIE SWOR MRN: [id number] Date of Birth: 06/12/1931

## 2017-09-12 ENCOUNTER — Ambulatory Visit: Payer: Medicare HMO | Admitting: Physical Therapy

## 2017-09-12 DIAGNOSIS — M6281 Muscle weakness (generalized): Secondary | ICD-10-CM | POA: Diagnosis not present

## 2017-09-12 DIAGNOSIS — R2681 Unsteadiness on feet: Secondary | ICD-10-CM | POA: Diagnosis not present

## 2017-09-12 DIAGNOSIS — R262 Difficulty in walking, not elsewhere classified: Secondary | ICD-10-CM

## 2017-09-12 DIAGNOSIS — R42 Dizziness and giddiness: Secondary | ICD-10-CM | POA: Diagnosis not present

## 2017-09-12 DIAGNOSIS — R2689 Other abnormalities of gait and mobility: Secondary | ICD-10-CM

## 2017-09-12 NOTE — Therapy (Signed)
[facility] [address] [place], [place], [address] Phone: [phone]   Fax:  [phone]  Physical Therapy Treatment  Patient Details  Name: Derek Carr MRN: [id number] Date of Birth: 10/21/1931 Referring Provider: Mechele Claude, PA   Encounter Date: 09/12/2017  PT End of Session - 09/12/17 1015    Visit Number  11    Number of Visits  27    Date for PT Re-Evaluation  10/26/17    Authorization Type  [employer] & Generic commercial 2nd    PT Start Time  [phone]    PT Stop Time  1012    PT Time Calculation (min)  39 min    Equipment Utilized During Treatment  Gait belt    Activity Tolerance  Patient tolerated treatment well;No increased pain    Behavior During Therapy  WFL for tasks assessed/performed       Past Medical History:  Diagnosis Date  . Anemia   . Ankle fracture    left  . Atherosclerosis   . Complication of anesthesia    CONFUSION - Pt's family very concerned about this  . DDD (degenerative disc disease)   . Degenerative arthritis   . Dermatitis, atopic ARMS AND LEGS  . Diverticulosis   . Erectile dysfunction   . Frequency of urination   . Glaucoma   . History of asbestos exposure   . History of radiation therapy 8/19-13-10/18/11   prostate, 76 GY  . History of shingles 2012-- BILATERAL EYES--  NO RESIDUAL  . Hyperlipidemia   . Hypertension   . Inguinal hernia   . Lumbar scoliosis   . Nocturia   . OA (osteoarthritis) of hip RIGHT  . Prostate cancer ([place]) 05/11/2011   bx=Adenocarcinoma,gleason3+4=7, 4+4=8,PSA=10.30volume=45.7cc  . Renal cyst    bilateral  . Smokers' cough ([place])   . Stroke ([place])   . Thyroid cyst     Past Surgical History:  Procedure Laterality Date  . ATTEMPTED LEFT VATS/ LEFT THORACOTOMY/ RESECTION OF THE ENORMOUS, PROBABLE BRONCHOGENIC CYST  01-04-2005  DR Arlyce Dice  . CARDIAC CATHETERIZATION  02-24-2009  DR NASHER   MINOR CORONARY ARTERY IRREGULARITIES/ NORMAL  LVSF/ EF 65-70%  . CATARACT EXTRACTION    . COLONOSCOPY W/ POLYPECTOMY    . CYSTOSCOPY  11/15/2011   Procedure: CYSTOSCOPY;  Surgeon: Dutch Gray, MD;  Location: [facility];  Service: Urology;  Laterality: N/A;  no seeds seen in bladder  . esophageus cyst removal  YRS AGO  . [place]  . ORIF ANKLE FRACTURE Left 05/03/2017  . ORIF ANKLE FRACTURE Left 05/03/2017   Procedure: OPEN REDUCTION INTERNAL FIXATION (ORIF) BIMALLEOLAR  ANKLE FRACTURE;  Surgeon: Wylene Simmer, MD;  Location: [facility];  Service: Orthopedics;  Laterality: Left;  . PROSTATE BIOPSY  05/11/11   Adenocarcinoma ([facility])  . RADIOACTIVE SEED IMPLANT  11/15/2011   Procedure: RADIOACTIVE SEED IMPLANT;  Surgeon: Dutch Gray, MD;  Location: [facility];  Service: Urology;  Laterality: N/A;  Total number of seeds - 52  . REPAIR RIGHT INGUINAL HERNIA W/ MESH  09-10-1999  . TOTAL HIP ARTHROPLASTY Right 03/20/2013   Procedure: RIGHT TOTAL HIP ARTHROPLASTY ANTERIOR APPROACH;  Surgeon: [name], MD;  Location: [facility];  Service: Orthopedics;  Laterality: Right;  . TOTAL HIP REVISION Right 05/14/2013   Procedure: open reduction internal fixation REVISION RIGHT HIP ;  Surgeon: [name], MD;  Location: [facility];  Service: Orthopedics;  Laterality:  Right;  . UMBILICAL HERNIA REPAIR  1998   epigastric    There were no vitals filed for this visit.  Subjective Assessment - 09/12/17 0936    Subjective  Pt went church with walker and practised at home with walking with cane along counter.    Pertinent History  left ankle fracture, TIAs, DDD, Lumbar scoliosis, Glaucoma, cataract surgery, HTN, right THR 03/20/13 with revision 05/14/13, right femur fracture, dementia/Alzheimers    Limitations  Standing;Walking;House hold activities    Patient Stated Goals  Patient wants to improve his balance & walking    Currently in Pain?  No/denies                       [facility] Adult PT  Treatment/Exercise - 09/12/17 0001      Ambulation/Gait   Ambulation/Gait  Yes    Ambulation/Gait Assistance  5: Supervision;4: Min assist    LOB x1 with retro gait requiring min A.   Ambulation/Gait Assistance Details  training to maintain upright posture, upright gaze, increase steplength, and balance with head turns and changes in speed and direction.                                  Ambulation Distance (Feet)  300 Feet   +150 ( LOB x1 with retro gait )   Assistive device  Straight cane    Gait Pattern  Step-through pattern;Decreased stance time - left;Decreased weight shift to left;Lateral hip instability;Wide base of support;Decreased stride length;Decreased step length - left;Poor foot clearance - right    Ambulation Surface  Level;Indoor          Balance Exercises - 09/12/17 0946      Balance Exercises: Standing   Standing Eyes Opened  Narrow base of support (BOS)   stagger stance weight shifting forward/backward with 1 UE support   Tandem Stance  Eyes open;2 reps;Other (comment)   stagger stance: switching foot in front, holding 30 attempting without UE suppport   Sidestepping  Other reps (comment);Other (comment)   10' X 4 in parallel bars with intermittent UE support   Marching Limitations  x10 in- place; cues for greater hip ROM, 1 UE support    Heel Raises Limitations  heel/toe raises x10, 1 UE support        PT Education - 09/12/17 1240    Education Details  Safety with walker especially with transfers and posture with gait.       PT Short Term Goals - 09/08/17 0948      PT SHORT TERM GOAL #1   Title  Pt will be independent with updated HEP exercises (All STGs Target Date 09/30/2017)    Baseline        Target Date  09/30/17      PT SHORT TERM GOAL #2   Title  Patient ambulates 250' paved surfaces with cane with supervision.     Baseline        Status  On-going    Target Date  09/30/17      PT SHORT TERM GOAL #3   Title  Timed Up & Go with cane <18sec  with supervision.     Baseline        Status  On-going    Target Date  09/30/17      PT SHORT TERM GOAL #4   Title  Patient standing balance reaching 10" anteriorly, to floor  and looks over shoulders without balance loss with supervision.     Baseline        Target Date  09/30/17      PT SHORT TERM GOAL #5   Title  Patient negotiates ramps & curbs with cane with min guard.     Status  New    Target Date  09/30/17        PT Long Term Goals - 09/07/17 1230      PT LONG TERM GOAL #1   Title  Pt will demonstrate independence with ongoing HEP program and fitness plan (All LTGs Target Date 10/26/2017)    Time  90    Period  Days    Status  On-going    Target Date  10/26/17      PT LONG TERM GOAL #2   Title  Patient ambulates Modified Independent 500' outdoors including grass, pavement, curbs, ramps with cane to improve community ambulatory skills    Time  90    Period  Days    Status  On-going    Target Date  10/26/17      PT LONG TERM GOAL #3   Title  Berg Balance >/= 45/56 to indicate lower fall risk.     Time  90    Period  Days    Status  On-going    Target Date  10/26/17      PT LONG TERM GOAL #4   Title  Timed Up-Go time with cane <13.5 seconds to indicate lower fall risk.     Time  90    Period  Days    Status  On-going    Target Date  10/26/17            Plan - 09/12/17 1232    Clinical Impression Statement  Continued to gait train with cane working on activity tolerance, posture, and balance with dynamic gait; pt required supervision to min guard with LOB x1 when stepping backwards.  Standing balance training working on narrow BOS and weight shifting ability; pt requires 1 to intermittent UE support.                                                                                        Rehab Potential  Good    PT Frequency  2x / week    PT Duration  Other (comment)   13 weeks (90 days)   PT Treatment/Interventions  Gait training;Functional mobility  training;Neuromuscular re-education;Balance training;Therapeutic exercise;Therapeutic activities;Patient/family education;ADLs/Self Care Home Management;Stair training;Orthotic Fit/Training;Manual techniques;Vestibular    PT Next Visit Plan  work towards updated STGs, Activity tolerance training; dynamic standing  balance; LE strengthening; gait with SPC.    Consulted and Agree with Plan of Care  Patient       Patient will benefit from skilled therapeutic intervention in order to improve the following deficits and impairments:  Abnormal gait, Decreased activity tolerance, Decreased balance, Decreased endurance, Decreased knowledge of use of DME, Decreased mobility, Decreased range of motion, Decreased safety awareness, Decreased strength, Impaired flexibility, Postural dysfunction  Visit Diagnosis: Muscle weakness (generalized)  Unsteadiness on feet  Other abnormalities of gait and mobility  Difficulty in walking, not elsewhere classified     Problem List Patient Active Problem List   Diagnosis Date Noted  . Post-operative pain   . Dementia without behavioral disturbance   . TIA (transient ischemic attack)   . Reactive hypertension   . Acute blood loss anemia   . Bimalleolar fracture of left ankle 05/03/2017  . Axonal neuropathy 03/21/2017  . Right sided weakness 03/01/2017  . Cigarette smoker 06/16/2016  . Dyspnea on exertion 06/15/2016  . Alzheimer disease 01/07/2014  . CSA (central sleep apnea) 10/05/2013  . Syncope 06/26/2013  . COPD GOLD II 06/25/2013  . BPH (benign prostatic hyperplasia) 05/18/2013  . Glaucoma 05/18/2013  . S/P right TH revision 05/14/2013  . Constipation 03/26/2013  . Osteoporosis 03/26/2013  . Expected blood loss anemia 03/23/2013  . S/P right THA, AA 03/20/2013  . Hyperlipidemia   . Erectile dysfunction   . History of asbestos exposure   . Nocturia   . History of shingles   . Dermatitis, atopic   . OA (osteoarthritis) of hip   . Arthritis    . Frequency of urination   . Lumbar scoliosis   . Malignant neoplasm of prostate ([facility]) 06/07/2011  . 84 year old gentleman with stage T3 adenocarcinoma prostate with Gleason score 4+4 and PSA of 10.3 05/11/2011    Bjorn Loser, PTA  09/12/17, 12:45 PM [facility] [address] [place], [place], [address] Phone: [phone]   Fax:  [phone]  Name: Derek Carr MRN: [id number] Date of Birth: [birth year]

## 2017-09-14 ENCOUNTER — Telehealth: Payer: Self-pay

## 2017-09-14 ENCOUNTER — Inpatient Hospital Stay (HOSPITAL_COMMUNITY)
Admission: EM | Admit: 2017-09-14 | Discharge: 2017-09-17 | DRG: 244 | Disposition: A | Discharge status: Home / Self Care | Payer: Medicare HMO | Attending: Internal Medicine | Admitting: Internal Medicine

## 2017-09-14 ENCOUNTER — Ambulatory Visit: Payer: Medicare HMO | Admitting: Physical Therapy

## 2017-09-14 ENCOUNTER — Other Ambulatory Visit: Payer: Self-pay

## 2017-09-14 ENCOUNTER — Encounter: Payer: Self-pay | Admitting: Physical Therapy

## 2017-09-14 ENCOUNTER — Observation Stay (HOSPITAL_COMMUNITY): Payer: Medicare HMO

## 2017-09-14 ENCOUNTER — Encounter (HOSPITAL_COMMUNITY): Payer: Self-pay | Admitting: Emergency Medicine

## 2017-09-14 ENCOUNTER — Emergency Department (HOSPITAL_COMMUNITY): Payer: Medicare HMO

## 2017-09-14 DIAGNOSIS — Z923 Personal history of irradiation: Secondary | ICD-10-CM | POA: Diagnosis not present

## 2017-09-14 DIAGNOSIS — I351 Nonrheumatic aortic (valve) insufficiency: Secondary | ICD-10-CM | POA: Diagnosis not present

## 2017-09-14 DIAGNOSIS — G309 Alzheimer's disease, unspecified: Secondary | ICD-10-CM | POA: Diagnosis present

## 2017-09-14 DIAGNOSIS — D649 Anemia, unspecified: Secondary | ICD-10-CM | POA: Diagnosis present

## 2017-09-14 DIAGNOSIS — Z8673 Personal history of transient ischemic attack (TIA), and cerebral infarction without residual deficits: Secondary | ICD-10-CM

## 2017-09-14 DIAGNOSIS — M6281 Muscle weakness (generalized): Secondary | ICD-10-CM

## 2017-09-14 DIAGNOSIS — E162 Hypoglycemia, unspecified: Secondary | ICD-10-CM | POA: Diagnosis not present

## 2017-09-14 DIAGNOSIS — I714 Abdominal aortic aneurysm, without rupture: Secondary | ICD-10-CM | POA: Diagnosis present

## 2017-09-14 DIAGNOSIS — N289 Disorder of kidney and ureter, unspecified: Secondary | ICD-10-CM

## 2017-09-14 DIAGNOSIS — Z885 Allergy status to narcotic agent status: Secondary | ICD-10-CM

## 2017-09-14 DIAGNOSIS — Z8042 Family history of malignant neoplasm of prostate: Secondary | ICD-10-CM | POA: Diagnosis not present

## 2017-09-14 DIAGNOSIS — G4731 Primary central sleep apnea: Secondary | ICD-10-CM | POA: Diagnosis present

## 2017-09-14 DIAGNOSIS — Z86010 Personal history of colonic polyps: Secondary | ICD-10-CM | POA: Diagnosis not present

## 2017-09-14 DIAGNOSIS — R2689 Other abnormalities of gait and mobility: Secondary | ICD-10-CM

## 2017-09-14 DIAGNOSIS — M810 Age-related osteoporosis without current pathological fracture: Secondary | ICD-10-CM | POA: Diagnosis present

## 2017-09-14 DIAGNOSIS — Z7982 Long term (current) use of aspirin: Secondary | ICD-10-CM

## 2017-09-14 DIAGNOSIS — K5790 Diverticulosis of intestine, part unspecified, without perforation or abscess without bleeding: Secondary | ICD-10-CM | POA: Diagnosis not present

## 2017-09-14 DIAGNOSIS — I498 Other specified cardiac arrhythmias: Secondary | ICD-10-CM | POA: Diagnosis not present

## 2017-09-14 DIAGNOSIS — R55 Syncope and collapse: Secondary | ICD-10-CM | POA: Diagnosis present

## 2017-09-14 DIAGNOSIS — R231 Pallor: Secondary | ICD-10-CM | POA: Diagnosis not present

## 2017-09-14 DIAGNOSIS — R001 Bradycardia, unspecified: Principal | ICD-10-CM | POA: Diagnosis present

## 2017-09-14 DIAGNOSIS — R262 Difficulty in walking, not elsewhere classified: Secondary | ICD-10-CM | POA: Diagnosis not present

## 2017-09-14 DIAGNOSIS — H409 Unspecified glaucoma: Secondary | ICD-10-CM | POA: Diagnosis not present

## 2017-09-14 DIAGNOSIS — F0280 Dementia in other diseases classified elsewhere without behavioral disturbance: Secondary | ICD-10-CM | POA: Diagnosis present

## 2017-09-14 DIAGNOSIS — M419 Scoliosis, unspecified: Secondary | ICD-10-CM | POA: Diagnosis present

## 2017-09-14 DIAGNOSIS — R42 Dizziness and giddiness: Secondary | ICD-10-CM | POA: Diagnosis not present

## 2017-09-14 DIAGNOSIS — Z9849 Cataract extraction status, unspecified eye: Secondary | ICD-10-CM | POA: Diagnosis not present

## 2017-09-14 DIAGNOSIS — D631 Anemia in chronic kidney disease: Secondary | ICD-10-CM | POA: Diagnosis present

## 2017-09-14 DIAGNOSIS — I4510 Unspecified right bundle-branch block: Secondary | ICD-10-CM | POA: Diagnosis not present

## 2017-09-14 DIAGNOSIS — N183 Chronic kidney disease, stage 3 unspecified: Secondary | ICD-10-CM

## 2017-09-14 DIAGNOSIS — Z87891 Personal history of nicotine dependence: Secondary | ICD-10-CM

## 2017-09-14 DIAGNOSIS — I129 Hypertensive chronic kidney disease with stage 1 through stage 4 chronic kidney disease, or unspecified chronic kidney disease: Secondary | ICD-10-CM | POA: Diagnosis present

## 2017-09-14 DIAGNOSIS — Z950 Presence of cardiac pacemaker: Secondary | ICD-10-CM | POA: Diagnosis not present

## 2017-09-14 DIAGNOSIS — Z8546 Personal history of malignant neoplasm of prostate: Secondary | ICD-10-CM

## 2017-09-14 DIAGNOSIS — Z96641 Presence of right artificial hip joint: Secondary | ICD-10-CM | POA: Diagnosis present

## 2017-09-14 DIAGNOSIS — R0789 Other chest pain: Secondary | ICD-10-CM

## 2017-09-14 DIAGNOSIS — R4781 Slurred speech: Secondary | ICD-10-CM | POA: Diagnosis not present

## 2017-09-14 DIAGNOSIS — R2681 Unsteadiness on feet: Secondary | ICD-10-CM

## 2017-09-14 DIAGNOSIS — E785 Hyperlipidemia, unspecified: Secondary | ICD-10-CM | POA: Diagnosis present

## 2017-09-14 DIAGNOSIS — Z77090 Contact with and (suspected) exposure to asbestos: Secondary | ICD-10-CM | POA: Diagnosis not present

## 2017-09-14 DIAGNOSIS — N400 Enlarged prostate without lower urinary tract symptoms: Secondary | ICD-10-CM | POA: Diagnosis present

## 2017-09-14 DIAGNOSIS — Z8249 Family history of ischemic heart disease and other diseases of the circulatory system: Secondary | ICD-10-CM

## 2017-09-14 DIAGNOSIS — Z801 Family history of malignant neoplasm of trachea, bronchus and lung: Secondary | ICD-10-CM

## 2017-09-14 DIAGNOSIS — I959 Hypotension, unspecified: Secondary | ICD-10-CM | POA: Diagnosis not present

## 2017-09-14 DIAGNOSIS — W19XXXA Unspecified fall, initial encounter: Secondary | ICD-10-CM | POA: Diagnosis not present

## 2017-09-14 HISTORY — DX: Nonrheumatic aortic (valve) insufficiency: I35.1

## 2017-09-14 HISTORY — DX: Abdominal aortic aneurysm, without rupture: I71.4

## 2017-09-14 HISTORY — DX: Abdominal aortic aneurysm, without rupture, unspecified: I71.40

## 2017-09-14 LAB — BASIC METABOLIC PANEL
ANION GAP: 6 (ref 5–15)
BUN: 17 mg/dL (ref 8–23)
CALCIUM: 9.4 mg/dL (ref 8.9–10.3)
CHLORIDE: 112 mmol/L — AB (ref 98–111)
CO2: 23 mmol/L (ref 22–32)
Creatinine, Ser: 1.65 mg/dL — ABNORMAL HIGH (ref 0.61–1.24)
GFR calc non Af Amer: 37 mL/min — ABNORMAL LOW (ref >60)
GFR, EST AFRICAN AMERICAN: 43 mL/min — AB (ref >60)
GLUCOSE: 99 mg/dL (ref 70–99)
POTASSIUM: 3.9 mmol/L (ref 3.5–5.1)
Sodium: 141 mmol/L (ref 135–145)

## 2017-09-14 LAB — URINALYSIS, ROUTINE W REFLEX MICROSCOPIC
Bilirubin Urine: NEGATIVE
Glucose, UA: NEGATIVE mg/dL
HGB URINE DIPSTICK: NEGATIVE
Ketones, ur: NEGATIVE mg/dL
Leukocytes, UA: NEGATIVE
Nitrite: NEGATIVE
PH: 5 (ref 5.0–8.0)
Protein, ur: NEGATIVE mg/dL
SPECIFIC GRAVITY, URINE: 1.019 (ref 1.005–1.030)

## 2017-09-14 LAB — CBC
HEMATOCRIT: 38.4 % — AB (ref 39.0–52.0)
HEMOGLOBIN: 11.9 g/dL — AB (ref 13.0–17.0)
MCH: 27.2 pg (ref 26.0–34.0)
MCHC: 31 g/dL (ref 30.0–36.0)
MCV: 87.7 fL (ref 78.0–100.0)
Platelets: 178 10*3/uL (ref 150–400)
RBC: 4.38 MIL/uL (ref 4.22–5.81)
RDW: 15.6 % — ABNORMAL HIGH (ref 11.5–15.5)
WBC: 3.5 10*3/uL — ABNORMAL LOW (ref 4.0–10.5)

## 2017-09-14 LAB — CBG MONITORING, ED: GLUCOSE-CAPILLARY: 100 mg/dL — AB (ref 70–99)

## 2017-09-14 LAB — TROPONIN I: Troponin I: <0.03 ng/mL (ref <0.03)

## 2017-09-14 MED ORDER — OMEGA-3-ACID ETHYL ESTERS 1 G PO CAPS
1.0000 g | ORAL_CAPSULE | Freq: Every day | ORAL | Status: DC
  Administered 2017-09-14 – 2017-09-16 (×3): 1 g via ORAL
  Filled 2017-09-14 (×3): qty 1

## 2017-09-14 MED ORDER — OMEGA DHA PO CHEW: CHEWABLE_TABLET | Freq: Every day | ORAL | Status: DC

## 2017-09-14 MED ORDER — VITAMIN B-12 1000 MCG PO TABS
1000.0000 ug | ORAL_TABLET | Freq: Two times a day (BID) | ORAL | Status: DC
  Administered 2017-09-14 – 2017-09-17 (×6): 1000 ug via ORAL
  Filled 2017-09-14 (×6): qty 1

## 2017-09-14 MED ORDER — ASPIRIN EC 81 MG PO TBEC
81.0000 mg | DELAYED_RELEASE_TABLET | Freq: Every day | ORAL | Status: DC
  Administered 2017-09-14 – 2017-09-17 (×4): 81 mg via ORAL
  Filled 2017-09-14 (×4): qty 1

## 2017-09-14 MED ORDER — UBIQUINOL 100 MG PO CAPS: 1.0000 | ORAL_CAPSULE | Freq: Every day | ORAL | Status: DC

## 2017-09-14 MED ORDER — VITAMIN D 1000 UNITS PO TABS
1000.0000 [IU] | ORAL_TABLET | Freq: Two times a day (BID) | ORAL | Status: DC
  Administered 2017-09-14 – 2017-09-17 (×6): 1000 [IU] via ORAL
  Filled 2017-09-14 (×7): qty 1

## 2017-09-14 MED ORDER — DHA OMEGA 3 100 MG PO CAPS: 1000.0000 mg | ORAL_CAPSULE | Freq: Every day | ORAL | Status: DC

## 2017-09-14 MED ORDER — LORATADINE 10 MG PO TABS
10.0000 mg | ORAL_TABLET | Freq: Every day | ORAL | Status: DC
  Administered 2017-09-14 – 2017-09-17 (×4): 10 mg via ORAL
  Filled 2017-09-14 (×4): qty 1

## 2017-09-14 MED ORDER — ENOXAPARIN SODIUM 40 MG/0.4ML ~~LOC~~ SOLN
40.0000 mg | SUBCUTANEOUS | Status: DC
  Administered 2017-09-14 – 2017-09-16 (×3): 40 mg via SUBCUTANEOUS
  Filled 2017-09-14 (×4): qty 0.4

## 2017-09-14 MED ORDER — FOLIC ACID 1 MG PO TABS
1.0000 mg | ORAL_TABLET | Freq: Every day | ORAL | Status: DC
  Administered 2017-09-14 – 2017-09-17 (×4): 1 mg via ORAL
  Filled 2017-09-14 (×4): qty 1

## 2017-09-14 MED ORDER — SODIUM CHLORIDE 0.9 % IV BOLUS
1000.0000 mL | Freq: Once | INTRAVENOUS | Status: AC
  Administered 2017-09-14: 1000 mL via INTRAVENOUS

## 2017-09-14 MED ORDER — IRBESARTAN 75 MG PO TABS
37.5000 mg | ORAL_TABLET | Freq: Every day | ORAL | Status: DC
  Administered 2017-09-15: 37.5 mg via ORAL
  Filled 2017-09-14 (×2): qty 0.5

## 2017-09-14 MED ORDER — SACCHAROMYCES BOULARDII 250 MG PO CAPS
250.0000 mg | ORAL_CAPSULE | Freq: Every day | ORAL | Status: DC
  Administered 2017-09-15 – 2017-09-17 (×3): 250 mg via ORAL
  Filled 2017-09-14 (×3): qty 1

## 2017-09-14 MED ORDER — DOCUSATE SODIUM 100 MG PO CAPS
100.0000 mg | ORAL_CAPSULE | Freq: Two times a day (BID) | ORAL | Status: DC
  Administered 2017-09-15 – 2017-09-17 (×5): 100 mg via ORAL
  Filled 2017-09-14 (×6): qty 1

## 2017-09-14 MED ORDER — VALACYCLOVIR HCL 500 MG PO TABS
1000.0000 mg | ORAL_TABLET | Freq: Every day | ORAL | Status: DC
  Administered 2017-09-15 – 2017-09-17 (×3): 1000 mg via ORAL
  Filled 2017-09-14 (×5): qty 2

## 2017-09-14 MED ORDER — CALCIUM CARBONATE ANTACID 500 MG PO CHEW
1.0000 | CHEWABLE_TABLET | Freq: Every day | ORAL | Status: DC
  Administered 2017-09-15 – 2017-09-17 (×3): 200 mg via ORAL
  Filled 2017-09-14 (×4): qty 1

## 2017-09-14 MED ORDER — CLOBETASOL PROPIONATE 0.05 % EX CREA
1.0000 "application " | TOPICAL_CREAM | Freq: Every day | CUTANEOUS | Status: DC
  Administered 2017-09-15 – 2017-09-17 (×3): 1 via TOPICAL
  Filled 2017-09-14: qty 15

## 2017-09-14 NOTE — ED Notes (Signed)
Patient transported to MRI 

## 2017-09-14 NOTE — Telephone Encounter (Signed)
Fainting / orthostatic. Will await cardio work up. Thank You, CD

## 2017-09-14 NOTE — Telephone Encounter (Addendum)
Pt was leaving neurorehab today when his daughter witnessed him to have "delayed verbal responses", "turn gray", had "seizure-like activity" and became "unconsciousness." I was summoned to assist. 911 was called by our sleep lab staff. Pt was A&Ox2-3(Pt could tell me his age, where he was, and that it was "close to the end of July"), at the time of my arrival, lying on a bench breathing, with a strong radial pulse. BP 116/80, HR of 64 O2 saturations of 98%. Pt's daughter reported that he was sitting at the time of this event and did not sustain a fall. EMS arrived on scene, pt's BS was 108. Pt was orthostatic upon standing, systolic BP was in the 80X when checked by EMS. Pt reported that he has been eating and drinking normally the past few days. Pt had just finished a 40 minute PT session when this event happened.  Pt is being transferred to the hospital for further evaluation.  Pt's daughter reports that they have been in contact with the cardiology office regarding the loop recorder but there was some confusion regarding if insurance would pay for it. I encouraged pt's daughter to keep in close touch with cardiology regarding this and she agreed.  Pt's daughter asked me to relay this event to Dr. Brett Fairy.

## 2017-09-14 NOTE — ED Triage Notes (Signed)
Per EMS pt was at his out patient PT appointment and while walking out the pt had a near syncopal event.  He was witnessed by daughter who stated that there was not LOC.  By the time EMS arrived the pt was AOx4.  Pt denies pain.  Pt has a history of left sided deficits and slurred speech from previous TIA.  Currently AOx4 NAD noted at this time.

## 2017-09-14 NOTE — ED Notes (Signed)
CBG 100 

## 2017-09-14 NOTE — H&P (Signed)
TRH H&P   Patient Demographics:    Derek Carr, is a 84 y.o. male  MRN: [id number]   DOB - Sep 05, 1931  Admit Date - 09/14/2017  Outpatient Primary MD for the patient is [name], MD  Referring MD/NP/PA:  Sherwood Gambler  Outpatient Specialists:  [facility]enc[facility] Partridge Dohmeier  Patient coming from:  Physical therapy  Chief Complaint  Patient presents with  . Near Syncope      HPI:    Derek Carr  is a 84 y.o. male, w hypertension, hyperlipidemia, stroke w residual slow speech and gait imbalance , mild cognitive impairment, prostate cancer s/p XRT apparently had slow speech and his sensorium was off per daughter.  He was able to walk back to PT, and then apparently almost passed out.  His daughter noted that his arms were slightly tremulous.    In ED,  Hr 46 currently on heart monitor.  (sinus).    CT brain IMPRESSION: No acute findings.  Opacification over the inferior right mastoid air cells unchanged.  MRI brain IMPRESSION: 1. No acute intracranial abnormality identified. 2. Stable mild chronic microvascular ischemic changes and volume loss of the brain for age.  Na 141, K 3.9, Bun 17, Creatinine 1.65,   Wbc 3.5, Hgb 11.9, Plt 178  Urinalysis negative  EKG nsr at 52, LAD, nl pr interval, and qtc, RBBB,   Trop <0.03  Pt will be admitted observation for alerted mental status, slow speech, ? R/o seizure as well as bradycardia.       Review of systems:    In addition to the HPI above,  No Fever-chills, No Headache, No changes with Vision or hearing, No problems swallowing food or Liquids, No Chest pain, Cough or Shortness of Breath, No Abdominal pain, No Nausea or Vommitting, Bowel movements are regular, No Blood in stool or Urine, No dysuria, No new skin rashes or bruises, No new joints pains-aches,  No new weakness, tingling, numbness in any  extremity, No recent weight gain or loss, No polyuria, polydypsia or polyphagia, No significant Mental Stressors.  A full 10 point Review of Systems was done, except as stated above, all other Review of Systems were negative.   With Past History of the following :    Past Medical History:  Diagnosis Date  . Anemia   . Ankle fracture    left  . Atherosclerosis   . Complication of anesthesia    CONFUSION - Pt's family very concerned about this  . DDD (degenerative disc disease)   . Degenerative arthritis   . Dermatitis, atopic ARMS AND LEGS  . Diverticulosis   . Erectile dysfunction   . Frequency of urination   . Glaucoma   . History of asbestos exposure   . History of radiation therapy 8/19-13-10/18/11   prostate, 29 GY  . History of shingles 2012-- BILATERAL EYES--  NO RESIDUAL  . Hyperlipidemia   .  Hypertension   . Inguinal hernia   . Lumbar scoliosis   . Nocturia   . OA (osteoarthritis) of hip RIGHT  . Prostate cancer ([place]) 05/11/2011   bx=Adenocarcinoma,gleason3+4=7, 4+4=8,PSA=10.30volume=45.7cc  . Renal cyst    bilateral  . Smokers' cough ([place])   . Stroke ([place])   . Thyroid cyst       Past Surgical History:  Procedure Laterality Date  . ATTEMPTED LEFT VATS/ LEFT THORACOTOMY/ RESECTION OF THE ENORMOUS, PROBABLE BRONCHOGENIC CYST  01-04-2005  DR Arlyce Dice  . CARDIAC CATHETERIZATION  02-24-2009  DR NASHER   MINOR CORONARY ARTERY IRREGULARITIES/ NORMAL LVSF/ EF 65-70%  . CATARACT EXTRACTION    . COLONOSCOPY W/ POLYPECTOMY    . CYSTOSCOPY  11/15/2011   Procedure: CYSTOSCOPY;  Surgeon: Dutch Gray, MD;  Location[facility];  Service: Urology;  Laterality: N/A;  no seeds seen in bladder  . esophageus cyst removal  YRS AGO  . [place]  . ORIF ANKLE FRACTURE Left 05/03/2017  . ORIF ANKLE FRACTURE Left 05/03/2017   Procedure: OPEN REDUCTION INTERNAL FIXATION (ORIF) BIMALLEOLAR  ANKLE FRACTURE;  Surgeon: Wylene Simmer, MD;  Location: [place];   Service: Orthopedics;  Laterality: Left;  . PROSTATE BIOPSY  05/11/11   Adenocarcinoma ([facility])  . RADIOACTIVE SEED IMPLANT  11/15/2011   Procedure: RADIOACTIVE SEED IMPLANT;  Surgeon: Dutch Gray, MD;  Location[facility];  Service: Urology;  Laterality: N/A;  Total number of seeds - 52  . REPAIR RIGHT INGUINAL HERNIA W/ MESH  09-10-1999  . TOTAL HIP ARTHROPLASTY Right 03/20/2013   Procedure: RIGHT TOTAL HIP ARTHROPLASTY ANTERIOR APPROACH;  Surgeon: [name], MD;  Location: [facility];  Service: Orthopedics;  Laterality: Right;  . TOTAL HIP REVISION Right 05/14/2013   Procedure: open reduction internal fixation REVISION RIGHT HIP ;  Surgeon: [name], MD;  Location: [facility];  Service: Orthopedics;  Laterality: Right;  . UMBILICAL HERNIA REPAIR  1998   epigastric      Social History:     Social History   Tobacco Use  . Smoking status: Former Smoker    Packs/day: 0.50    Years: 62.00    Pack years: 31.00    Types: Cigarettes  . Smokeless tobacco: Never Used  . Tobacco comment: smoke one or two per day  Substance Use Topics  . Alcohol use: No    Alcohol/week: 1.0 standard drinks    Types: 1 Cans of beer per week     Lives - at home  Mobility - walks   Family History :     Family History  Problem Relation Age of Onset  . Hypertension Mother   . Prostate cancer Brother        surgery/cured  . Lung cancer Brother        new primary/same brother       Home Medications:   Prior to Admission medications   Medication Sig Start Date End Date Taking? Authorizing Provider  acetaminophen (TYLENOL) 500 MG tablet Take 1,000 mg by mouth 2 (two) times daily.    Yes [provider]  aspirin EC 81 MG tablet Take 81 mg by mouth daily.   Yes [provider]  Calcium Carbonate Antacid (TUMS PO) Take 1 tablet by mouth daily.   Yes [provider]  cetirizine (ZYRTEC) 10 MG tablet Take 10 mg by mouth daily.   Yes [provider]  Cholecalciferol (VITAMIN D-3) 1000 units CAPS  Take 1 capsule by mouth 2 (two) times daily.    Yes [provider]  Cyanocobalamin (VITAMIN B12) 1000 MCG TBCR Take 1 tablet by mouth 2 (two) times daily.    Yes [provider]  Docosahexaenoic Acid (DHA OMEGA 3 PO) Take 1,000 mg by mouth daily.    Yes [provider]  docusate sodium (COLACE) 100 MG capsule Take 1 capsule (100 mg total) by mouth 2 (two) times daily. While taking narcotic pain medicine. 05/04/17  Yes Corky Sing, PA-C  folic acid (FOLVITE) 1 MG tablet Take 1 mg by mouth daily.   Yes [provider]  irbesartan (AVAPRO) 75 MG tablet Take 37.5 mg by mouth daily. **REPLACES VALSARTAN** 11/28/16  Yes [provider]  Omega 3-6-9 Fatty Acids (OMEGA DHA PO) Take 1 tablet by mouth at bedtime.    Yes [provider]  Probiotic Product (PROBIOTIC PO) Take 1 capsule by mouth daily.    Yes [provider]  senna (SENOKOT) 8.6 MG TABS tablet Take 2 tablets (17.2 mg total) by mouth 2 (two) times daily. 05/04/17  Yes Corky Sing, PA-C  Ubiquinol (QUNOL COQ10/UBIQUINOL/MEGA) 100 MG CAPS Take 1 tablet by mouth at bedtime.    Yes [provider]  valACYclovir (VALTREX) 1000 MG tablet Take 1,000 mg by mouth daily.   Yes [provider]  clobetasol cream (TEMOVATE) 1.61 % Apply 1 application topically daily at 12 noon. 09/07/17   [provider]     Allergies:     Allergies  Allergen Reactions  . Oxycodone Other (See Comments)  . Dilaudid [Hydromorphone Hcl] Other (See Comments)    Confusion   . Methocarbamol Other (See Comments)    Drowsiness   . Percodan [Oxycodone-Aspirin] Other (See Comments)    Confusion   . Tramadol Other (See Comments)    Drowsiness   . Vicodin [Hydrocodone-Acetaminophen] Other (See Comments)    Confusion      Physical Exam:   Vitals  Blood pressure 127/75, pulse 62, temperature 97.7 F (36.5  C), temperature source Oral, resp. rate 18, height 6\' 1"  (1.854 m), weight 81.6 kg, SpO2 97 %.   1. General  lying in bed in NAD,   2. Normal affect and insight, Not Suicidal or Homicidal, Awake Alert, Oriented X 3.  3. No F.N deficits, ALL C.Nerves Intact, Strength 5/5 all 4 extremities, Sensation intact all 4 extremities, Plantars down going. Speech fluent  4. Ears and Eyes appear Normal, Conjunctivae clear, PERRLA. Moist Oral Mucosa.  5. Supple Neck, No JVD, No cervical lymphadenopathy appriciated, No Carotid Bruits.  6. Symmetrical Chest wall movement, Good air movement bilaterally, CTAB.  7. RRR, s1, s2, 1/6 sem rusb  8. Positive Bowel Sounds, Abdomen Soft, No tenderness, No organomegaly appriciated,No rebound -guarding or rigidity.  9.  No Cyanosis, Normal Skin Turgor, No Skin Rash or Bruise.  10. Good muscle tone,  joints appear normal , no effusions, Normal ROM.  11. No Palpable Lymph Nodes in Neck or Axillae      Data Review:    CBC Recent Labs  Lab 09/14/17 1252  WBC 3.5*  HGB 11.9*  HCT 38.4*  PLT 178  MCV 87.7  MCH 27.2  MCHC 31.0  RDW 15.6*   ------------------------------------------------------------------------------------------------------------------  Chemistries  Recent Labs  Lab 09/14/17 1252  NA 141  K 3.9  CL 112*  CO2 23  GLUCOSE 99  BUN 17  CREATININE 1.65*  CALCIUM 9.4   ------------------------------------------------------------------------------------------------------------------ estimated creatinine clearance  is 38.3 mL/min (A) (by C-G formula based on SCr of 1.65 mg/dL (H)). ------------------------------------------------------------------------------------------------------------------ No results for input(s): TSH, T4TOTAL, T3FREE, THYROIDAB in the last 72 hours.  Invalid input(s): FREET3  Coagulation profile No results for input(s): INR, PROTIME in the last 168  hours. ------------------------------------------------------------------------------------------------------------------- No results for input(s): DDIMER in the last 72 hours. -------------------------------------------------------------------------------------------------------------------  Cardiac Enzymes No results for input(s): CKMB, TROPONINI, MYOGLOBIN in the last 168 hours.  Invalid input(s): CK ------------------------------------------------------------------------------------------------------------------ No results found for: BNP   ---------------------------------------------------------------------------------------------------------------  Urinalysis    Component Value Date/Time   COLORURINE YELLOW 09/14/2017 [place] 09/14/2017 1540   LABSPEC 1.019 09/14/2017 1540   PHURINE 5.0 09/14/2017 1540   GLUCOSEU NEGATIVE 09/14/2017 1540   HGBUR NEGATIVE 09/14/2017 1540   BILIRUBINUR NEGATIVE 09/14/2017 1540   KETONESUR NEGATIVE 09/14/2017 1540   PROTEINUR NEGATIVE 09/14/2017 1540   UROBILINOGEN 0.2 05/14/2013 1400   NITRITE NEGATIVE 09/14/2017 1540   LEUKOCYTESUR NEGATIVE 09/14/2017 1540    ----------------------------------------------------------------------------------------------------------------   Imaging Results:    Ct Head Wo Contrast  Result Date: 09/14/2017 CLINICAL DATA:  Near syncopal episode. Residual left-sided deficits from previous TIA. EXAM: CT HEAD WITHOUT CONTRAST TECHNIQUE: Contiguous axial images were obtained from the base of the skull through the vertex without intravenous contrast. COMPARISON:  03/01/2017 FINDINGS: Brain: Ventricles, cisterns and other CSF spaces are within normal. There is no mass, mass effect, shift of midline structures or acute hemorrhage. No evidence of acute infarction. Mild basal ganglia calcifications are present. No acute infarction. Vascular: No hyperdense vessel or unexpected calcification. Skull:  Normal. Negative for fracture or focal lesion. Sinuses/Orbits: Orbits are normal. Paranasal sinuses are clear. There is opacification over the inferior right mastoid air cells unchanged. Other: None. IMPRESSION: No acute findings. Opacification over the inferior right mastoid air cells unchanged. Electronically Signed   By: Marin Olp M.D.   On: 09/14/2017 16:11       Assessment & Plan:    Principal Problem:   Near syncope Active Problems:   Anemia   Slurred speech   Renal insufficiency   Near syncope, slow speech, tremulousness ? Seizure Tele Trop I q6h x3 MRI brain EEG No need for carotid ultrasound, due to prior ultrasound 03/02/2017 negative for stenosis Check cardiac echo due to prior hx of AR  Renal insufficiency Hydrate gently with ns iv Check cmp in am  Anemia Check cbc in am  H/o Stroke Check Lipid Cont Asprin 81mg  po qday  Hypertension Cont ARB   DVT Prophylaxis Lovenox - SCDs  AM Labs Ordered, also please review Full Orders  Family Communication: Admission, patients condition and plan of care including tests being ordered have been discussed with the patient  who indicate understanding and agree with the plan and Code Status.  Code Status  FULL CODE  Likely DC to  home  Condition GUARDED    Consults called:   none  Admission status: observation  Time spent in minutes : 41   Jani Gravel M.D on 09/14/2017 at 6:37 PM  Between 7am to 7pm - Pager - [phone]  . After 7pm go to [email or web address] - password [facility]  [facility] - Office  [phone]

## 2017-09-14 NOTE — ED Provider Notes (Signed)
[facility] EMERGENCY DEPARTMENT Provider Note   CSN: [id number] Arrival date & time: 09/14/17  1207     History   Chief Complaint Chief Complaint  Patient presents with  . Near Syncope    HPI Derek Carr is a 84 y.o. male.  HPI  84 year old male presents after near syncope.  The history is taken from the daughter and somewhat from the patient.  The patient had done physical therapy and had a good session. He had then rested and felt fine. At some point on the way back to the car while he was in the wheelchair the patient started to have some slurred speech and when the daughter looked he looked like he was about to pass out.  She went to go get some help and when she came back she hit him on the face a couple times a seem to wake back up and now is back to normal.  The patient denies any headache, chest pain, shortness of breath or palpitations.  Never seem to have any focal weakness.  Family endorses the patient is currently normal.  Past Medical History:  Diagnosis Date  . AAA (abdominal aortic aneurysm) ([place])   . Anemia   . Ankle fracture    left  . Aortic regurgitation   . Atherosclerosis   . Complication of anesthesia    CONFUSION - Pt's family very concerned about this  . DDD (degenerative disc disease)   . Degenerative arthritis   . Dermatitis, atopic ARMS AND LEGS  . Diverticulosis   . Erectile dysfunction   . Frequency of urination   . Glaucoma   . History of asbestos exposure   . History of radiation therapy 8/19-13-10/18/11   prostate, 40 GY  . History of shingles 2012-- BILATERAL EYES--  NO RESIDUAL  . Hyperlipidemia   . Hypertension   . Inguinal hernia   . Lumbar scoliosis   . Nocturia   . OA (osteoarthritis) of hip RIGHT  . Prostate cancer ([place]) 05/11/2011   bx=Adenocarcinoma,gleason3+4=7, 4+4=8,PSA=10.30volume=45.7cc  . Renal cyst    bilateral  . Smokers' cough ([place])   . Stroke ([place])   . Thyroid cyst     Patient Active  Problem List   Diagnosis Date Noted  . Near syncope 09/14/2017  . Slurred speech 09/14/2017  . Renal insufficiency 09/14/2017  . Post-operative pain   . Dementia without behavioral disturbance   . TIA (transient ischemic attack)   . Reactive hypertension   . Acute blood loss anemia   . Bimalleolar fracture of left ankle 05/03/2017  . Axonal neuropathy 03/21/2017  . Right sided weakness 03/01/2017  . Cigarette smoker 06/16/2016  . Dyspnea on exertion 06/15/2016  . Alzheimer disease 01/07/2014  . CSA (central sleep apnea) 10/05/2013  . Syncope 06/26/2013  . COPD GOLD II 06/25/2013  . BPH (benign prostatic hyperplasia) 05/18/2013  . Glaucoma 05/18/2013  . S/P right TH revision 05/14/2013  . Constipation 03/26/2013  . Osteoporosis 03/26/2013  . Anemia 03/23/2013  . S/P right THA, AA 03/20/2013  . Hyperlipidemia   . Erectile dysfunction   . History of asbestos exposure   . Nocturia   . History of shingles   . Dermatitis, atopic   . OA (osteoarthritis) of hip   . Arthritis   . Frequency of urination   . Lumbar scoliosis   . Malignant neoplasm of prostate ([place]) 06/07/2011  . 84 year old gentleman with stage T3 adenocarcinoma prostate with Gleason score 4+4 and PSA of  10.3 05/11/2011    Past Surgical History:  Procedure Laterality Date  . ATTEMPTED LEFT VATS/ LEFT THORACOTOMY/ RESECTION OF THE ENORMOUS, PROBABLE BRONCHOGENIC CYST  01-04-2005  DR Arlyce Dice  . CARDIAC CATHETERIZATION  02-24-2009  DR NASHER   MINOR CORONARY ARTERY IRREGULARITIES/ NORMAL LVSF/ EF 65-70%  . CATARACT EXTRACTION    . COLONOSCOPY W/ POLYPECTOMY    . CYSTOSCOPY  11/15/2011   Procedure: CYSTOSCOPY;  Surgeon: Dutch Gray, MD;  Location: [facility];  Service: Urology;  Laterality: N/A;  no seeds seen in bladder  . esophageus cyst removal  YRS AGO  . [place]  . ORIF ANKLE FRACTURE Left 05/03/2017  . ORIF ANKLE FRACTURE Left 05/03/2017   Procedure: OPEN REDUCTION INTERNAL  FIXATION (ORIF) BIMALLEOLAR  ANKLE FRACTURE;  Surgeon: Wylene Simmer, MD;  Location: [facility];  Service: Orthopedics;  Laterality: Left;  . PROSTATE BIOPSY  05/11/11   Adenocarcinoma ([facility])  . RADIOACTIVE SEED IMPLANT  11/15/2011   Procedure: RADIOACTIVE SEED IMPLANT;  Surgeon: Dutch Gray, MD;  Location: [facility];  Service: Urology;  Laterality: N/A;  Total number of seeds - 52  . REPAIR RIGHT INGUINAL HERNIA W/ MESH  09-10-1999  . TOTAL HIP ARTHROPLASTY Right 03/20/2013   Procedure: RIGHT TOTAL HIP ARTHROPLASTY ANTERIOR APPROACH;  Surgeon: [name], MD;  Location: [facility];  Service: Orthopedics;  Laterality: Right;  . TOTAL HIP REVISION Right 05/14/2013   Procedure: open reduction internal fixation REVISION RIGHT HIP ;  Surgeon: [name], MD;  Location: [facility];  Service: Orthopedics;  Laterality: Right;  . UMBILICAL HERNIA REPAIR  1998   epigastric        Home Medications    Prior to Admission medications   Medication Sig Start Date End Date Taking? Authorizing Provider  acetaminophen (TYLENOL) 500 MG tablet Take 1,000 mg by mouth 2 (two) times daily.    Yes [provider]  aspirin EC 81 MG tablet Take 81 mg by mouth daily.   Yes [provider]  Calcium Carbonate Antacid (TUMS PO) Take 1 tablet by mouth daily.   Yes [provider]  cetirizine (ZYRTEC) 10 MG tablet Take 10 mg by mouth daily.   Yes [provider]  Cholecalciferol (VITAMIN D-3) 1000 units CAPS Take 1 capsule by mouth 2 (two) times daily.    Yes [provider]  Cyanocobalamin (VITAMIN B12) 1000 MCG TBCR Take 1 tablet by mouth 2 (two) times daily.    Yes [provider]  Docosahexaenoic Acid (DHA OMEGA 3 PO) Take 1,000 mg by mouth daily.    Yes [provider]  docusate sodium (COLACE) 100 MG capsule Take 1 capsule (100 mg total) by mouth 2 (two) times daily. While taking narcotic pain medicine. 05/04/17  Yes Corky Sing, PA-C   folic acid (FOLVITE) 1 MG tablet Take 1 mg by mouth daily.   Yes [provider]  irbesartan (AVAPRO) 75 MG tablet Take 37.5 mg by mouth daily. **REPLACES VALSARTAN** 11/28/16  Yes [provider]  Omega 3-6-9 Fatty Acids (OMEGA DHA PO) Take 1 tablet by mouth at bedtime.    Yes [provider]  Probiotic Product (PROBIOTIC PO) Take 1 capsule by mouth daily.    Yes [provider]  senna (SENOKOT) 8.6 MG TABS tablet Take 2 tablets (17.2 mg total) by mouth 2 (two) times daily. 05/04/17  Yes Corky Sing, PA-C  Ubiquinol (QUNOL COQ10/UBIQUINOL/MEGA) 100 MG CAPS Take  1 tablet by mouth at bedtime.    Yes [provider]  valACYclovir (VALTREX) 1000 MG tablet Take 1,000 mg by mouth daily.   Yes [provider]  clobetasol cream (TEMOVATE) 9.62 % Apply 1 application topically daily at 12 noon. 09/07/17   [provider]    Family History Family History  Problem Relation Age of Onset  . Hypertension Mother   . Prostate cancer Brother        surgery/cured  . Lung cancer Brother        new primary/same brother    Social History Social History   Tobacco Use  . Smoking status: Former Smoker    Packs/day: 0.50    Years: 62.00    Pack years: 31.00    Types: Cigarettes  . Smokeless tobacco: Never Used  . Tobacco comment: smoke one or two per day  Substance Use Topics  . Alcohol use: No    Alcohol/week: 1.0 standard drinks    Types: 1 Cans of beer per week  . Drug use: No    Comment: quit smoking 02/2012     Allergies   Oxycodone; Dilaudid [hydromorphone hcl]; Methocarbamol; Percodan [oxycodone-aspirin]; Tramadol; and Vicodin [hydrocodone-acetaminophen]   Review of Systems Review of Systems  Respiratory: Negative for shortness of breath.   Cardiovascular: Negative for chest pain.  Gastrointestinal: Negative for vomiting.  Neurological: Positive for speech difficulty. Negative for syncope, weakness and headaches.  All  other systems reviewed and are negative.    Physical Exam Updated Vital Signs BP (!) 134/93   Pulse 73   Temp 97.7 F (36.5 C) (Oral)   Resp 17   Ht 6\' 1"  (1.854 m)   Wt 81.6 kg   SpO2 99%   BMI 23.75 kg/m   Physical Exam  Constitutional: He is oriented to person, place, and time. He appears well-developed and well-nourished. No distress.  HENT:  Head: Normocephalic and atraumatic.  Right Ear: External ear normal.  Left Ear: External ear normal.  Nose: Nose normal.  Eyes: EOM are normal. Right eye exhibits no discharge. Left eye exhibits no discharge.  Neck: Neck supple.  Cardiovascular: Normal rate, regular rhythm and normal heart sounds.  Pulmonary/Chest: Effort normal and breath sounds normal.  Abdominal: Soft. There is no tenderness.  Musculoskeletal: He exhibits no edema.  Neurological: He is alert and oriented to person, place, and time.  CN 3-12 grossly intact. 5/5 strength in all 4 extremities. Grossly normal sensation. Normal finger to nose.   Skin: Skin is warm and dry. He is not diaphoretic.  Nursing note and vitals reviewed.    ED Treatments / Results  Labs (all labs ordered are listed, but only abnormal results are displayed) Labs Reviewed  BASIC METABOLIC PANEL - Abnormal; Notable for the following components:      Result Value   Chloride 112 (*)    Creatinine, Ser 1.65 (*)    GFR calc non Af Amer 37 (*)    GFR calc Af Amer 43 (*)    All other components within normal limits  CBC - Abnormal; Notable for the following components:   WBC 3.5 (*)    Hemoglobin 11.9 (*)    HCT 38.4 (*)    RDW 15.6 (*)    All other components within normal limits  CBG MONITORING, ED - Abnormal; Notable for the following components:   Glucose-Capillary 100 (*)    All other components within normal limits  URINALYSIS, ROUTINE W REFLEX MICROSCOPIC  COMPREHENSIVE METABOLIC  PANEL  CBC  TSH  TROPONIN I  TROPONIN I  TROPONIN I    EKG EKG  Interpretation  Date/Time:  Wednesday September 14 2017 12:33:52 EDT Ventricular Rate:  52 PR Interval:  160 QRS Duration: 154 QT Interval:  458 QTC Calculation: 425 R Axis:   -77 Text Interpretation:  Sinus bradycardia with sinus arrhythmia Right bundle branch block Left anterior fascicular block ** Bifascicular block ** Minimal voltage criteria for LVH, may be normal variant Cannot rule out Anteroseptal infarct , age undetermined Abnormal ECG no significant change since Feb 2019 Confirmed by Sherwood Gambler [id number]) on 09/14/2017 12:46:39 PM   Radiology Ct Head Wo Contrast  Result Date: 09/14/2017 CLINICAL DATA:  Near syncopal episode. Residual left-sided deficits from previous TIA. EXAM: CT HEAD WITHOUT CONTRAST TECHNIQUE: Contiguous axial images were obtained from the base of the skull through the vertex without intravenous contrast. COMPARISON:  03/01/2017 FINDINGS: Brain: Ventricles, cisterns and other CSF spaces are within normal. There is no mass, mass effect, shift of midline structures or acute hemorrhage. No evidence of acute infarction. Mild basal ganglia calcifications are present. No acute infarction. Vascular: No hyperdense vessel or unexpected calcification. Skull: Normal. Negative for fracture or focal lesion. Sinuses/Orbits: Orbits are normal. Paranasal sinuses are clear. There is opacification over the inferior right mastoid air cells unchanged. Other: None. IMPRESSION: No acute findings. Opacification over the inferior right mastoid air cells unchanged. Electronically Signed   By: Marin Olp M.D.   On: 09/14/2017 16:11   Mr Brain Wo Contrast  Result Date: 09/14/2017 CLINICAL DATA:  84 y/o  M; near syncopal event. EXAM: MRI HEAD WITHOUT CONTRAST TECHNIQUE: Multiplanar, multiecho pulse sequences of the brain and surrounding structures were obtained without intravenous contrast. COMPARISON:  03/02/2017 MRI head.  09/14/2017 CT head. FINDINGS: Brain: No acute infarction, hemorrhage,  hydrocephalus, extra-axial collection or mass lesion. Stable nonspecific T2 FLAIR hyperintensities in subcortical and periventricular white matter as well as the pons are compatible with mild chronic microvascular ischemic changes for age. Mild stable volume loss of the brain. Vascular: Normal flow voids. Skull and upper cervical spine: Normal marrow signal. Sinuses/Orbits: Mild paranasal sinus mucosal thickening. Right mastoid opacification. Bilateral intra-ocular lens replacement. Other: None. IMPRESSION: 1. No acute intracranial abnormality identified. 2. Stable mild chronic microvascular ischemic changes and volume loss of the brain for age. Electronically Signed   By: Kristine Garbe M.D.   On: 09/14/2017 20:49    Procedures Procedures (including critical care time)  Medications Ordered in ED Medications  enoxaparin (LOVENOX) injection 40 mg (has no administration in time range)  sodium chloride 0.9 % bolus 1,000 mL (0 mLs Intravenous Stopped 09/14/17 1713)     Initial Impression / Assessment and Plan / ED Course  I have reviewed the triage vital signs and the nursing notes.  Pertinent labs & imaging results that were available during my care of the patient were reviewed by me and considered in my medical decision making (see chart for details).     Patient is well appearing here. Presentation is c/w near-syncope.  The slurred speech is probably from global hypoperfusion from the near syncope rather than a stroke.  However given age and past history, CT head obtained and is negative.  No clear cause for the near syncope and given he is been on and off bradycardic without any obvious AV blocks I think he will need admission for near syncope work-up.  Dr. Maudie Mercury to admit.  Final Clinical Impressions(s) / ED Diagnoses  Final diagnoses:  Near syncope    ED Discharge Orders    None       Sherwood Gambler, MD 09/14/17 2101

## 2017-09-14 NOTE — ED Notes (Signed)
Patient transported to CT 

## 2017-09-15 ENCOUNTER — Ambulatory Visit (HOSPITAL_COMMUNITY): Payer: Medicare HMO

## 2017-09-15 ENCOUNTER — Observation Stay (HOSPITAL_COMMUNITY): Payer: Medicare HMO

## 2017-09-15 DIAGNOSIS — N183 Chronic kidney disease, stage 3 unspecified: Secondary | ICD-10-CM

## 2017-09-15 DIAGNOSIS — R55 Syncope and collapse: Secondary | ICD-10-CM

## 2017-09-15 LAB — ECHOCARDIOGRAM COMPLETE
HEIGHTINCHES: 75 in
Weight: 2860.69 oz

## 2017-09-15 LAB — COMPREHENSIVE METABOLIC PANEL
ALBUMIN: 2.9 g/dL — AB (ref 3.5–5.0)
ALK PHOS: 68 U/L (ref 38–126)
ALT: 18 U/L (ref 0–44)
ANION GAP: 6 (ref 5–15)
AST: 18 U/L (ref 15–41)
BILIRUBIN TOTAL: 0.4 mg/dL (ref 0.3–1.2)
BUN: 14 mg/dL (ref 8–23)
CO2: 24 mmol/L (ref 22–32)
Calcium: 8.9 mg/dL (ref 8.9–10.3)
Chloride: 111 mmol/L (ref 98–111)
Creatinine, Ser: 1.33 mg/dL — ABNORMAL HIGH (ref 0.61–1.24)
GFR calc Af Amer: 55 mL/min — ABNORMAL LOW (ref >60)
GFR calc non Af Amer: 48 mL/min — ABNORMAL LOW (ref >60)
GLUCOSE: 81 mg/dL (ref 70–99)
POTASSIUM: 3.7 mmol/L (ref 3.5–5.1)
SODIUM: 141 mmol/L (ref 135–145)
Total Protein: 6.8 g/dL (ref 6.5–8.1)

## 2017-09-15 LAB — TSH: TSH: 1.767 u[IU]/mL (ref 0.350–4.500)

## 2017-09-15 LAB — CBC
HEMATOCRIT: 35.7 % — AB (ref 39.0–52.0)
HEMOGLOBIN: 11.4 g/dL — AB (ref 13.0–17.0)
MCH: 27.7 pg (ref 26.0–34.0)
MCHC: 31.9 g/dL (ref 30.0–36.0)
MCV: 86.9 fL (ref 78.0–100.0)
Platelets: 171 10*3/uL (ref 150–400)
RBC: 4.11 MIL/uL — ABNORMAL LOW (ref 4.22–5.81)
RDW: 15.3 % (ref 11.5–15.5)
WBC: 3.2 10*3/uL — ABNORMAL LOW (ref 4.0–10.5)

## 2017-09-15 LAB — TROPONIN I
Troponin I: <0.03 ng/mL (ref <0.03)
Troponin I: <0.03 ng/mL (ref <0.03)

## 2017-09-15 MED ORDER — HYDRALAZINE HCL 20 MG/ML IJ SOLN
10.0000 mg | Freq: Once | INTRAMUSCULAR | Status: AC
  Administered 2017-09-15: 10 mg via INTRAVENOUS
  Filled 2017-09-15: qty 1

## 2017-09-15 NOTE — Progress Notes (Signed)
EEG completed, results pending. 

## 2017-09-15 NOTE — Progress Notes (Signed)
PROGRESS NOTE  Derek Carr IZT:[id number] DOB: [birth year] DOA: 09/14/2017 PCP: [name], MD  Brief Narrative: 84 year old man PMH hypertension, hyperlipidemia, stroke with residual/speech and gait and balance, mild cognitive impairment who was leaving neuro rehab 8/21 when his daughter noticed him to have delayed verbal responses.  Remaining chart documentation conflicts with some suggesting seizure-like activity and unconsciousness and others reporting that the daughter denied loss of consciousness.  Patient was currently in his wheelchair this occurred.  EMS noted orthostasis with systolic blood pressure in the 80s, normal blood sugar.  He was admitted for near syncope.  Assessment/Plan Near syncope.  Troponins negative.  TSH within normal limits.  Urinalysis negative.  Echocardiogram unremarkable.  EKG showed sinus bradycardia, sinus arrhythmia, right bundle branch block, LVH, compared to previous study 03/02/2017, no acute changes.  Bradycardia seen at that time as well. --History does not suggest seizure. EEG pending. --Sinus bradycardia noted on telemetry with transient rates in the 30s-40s.  Not on any rate control agents --Favor orthostasis is etiology although cannot exclude arrhythmia.  Family requests inpatient consultation with electrophysiology, question whether they will pursue loop recorder as an inpatient.  Consult placed.  CKD stage III. Creatinine appears to be at baseline between 1.3-1.6  PMH stroke with residual speech abnormality and gait instability.  Neurologically at baseline per daughter. --Continue aspirin.  Essential hypertension.  Orthostatic on admission. --Recheck orthostatics.  DVT prophylaxis: enoxaparin Code Status: Full Family Communication: daughter at bedside Disposition Plan: home    Derek Hodgkins, MD  [facility] Direct contact: [phone] --[place]ia [place]  --[email or web address]; password [email or web address]  7PM-7AM contact night coverage as  above 09/15/2017, 3:01 PM  LOS: 0 days   Consultants:    Procedures:  EEG  Echo Study Conclusions  - Left ventricle: The cavity size was normal. Systolic function was   normal. The estimated ejection fraction was in the range of 55%   to 60%. Although no diagnostic regional wall motion abnormality   was identified, this possibility cannot be completely excluded on   the basis of this study. Doppler parameters are consistent with   abnormal left ventricular relaxation (grade 1 diastolic   dysfunction). - Aortic valve: There was mild regurgitation. - Left atrium: The atrium was mildly dilated. - Pericardium, extracardiac: A trivial pericardial effusion was   identified.  Antimicrobials:    Interval history/Subjective: Patient feels well, no complaints.  Daughter reports yesterday that after physical therapy, as they were leaving the building, the patient nearly passed out but did not lose consciousness.  He was very slow to respond.  Daughter took immediate action and laid him flat and raised his legs.  She has some general shaking but no definite seizure activity.  She tells me that the patient has been seen by Dr. Lovena Le for consideration of loop recorder placement for history of cryptogenic TIAs.  Objective: Vitals:  Vitals:   09/15/17 0913 09/15/17 1026  BP: (!) 182/106 (!) 144/85  Pulse:    Resp:    Temp:    SpO2:      Exam:  Constitutional:  . Appears calm and comfortable Eyes:  . pupils and irises appear normal . Normal lids  ENMT:  . grossly normal hearing  Respiratory:  . CTA bilaterally, no w/r/r.  . Respiratory effort normal.  Cardiovascular:  . RRR, no m/r/g . No LE extremity edema   Musculoskeletal:  . Digits/nails BUE: no clubbing, cyanosis, petechiae, infection . RUE, LUE.  Strength symmetric, appears grossly normal. .  RLE, LLE.  4/5, strength symmetric. Neurologic:  . CN 2-12 intact . No upper extremity dysdiadochokinesis Psychiatric:   . Mental status o Mood, affect appropriate o Oriented to person, place, not time   I have personally reviewed the following:   Labs:  Creatinine 1.33, improved.  Remainder CMP unremarkable.  Troponins negative  CBC unremarkable  TSH within normal  Imaging studies:  CT head no acute changes  MRI brain no acute abnormalities  Medical tests:  EKG sinus bradycardia with right bundle branch block, no significant change compared to previous study 03/02/2017.  Scheduled Meds: . aspirin EC  81 mg Oral Daily  . calcium carbonate  1 tablet Oral Q breakfast  . cholecalciferol  1,000 Units Oral BID  . clobetasol cream  1 application Topical [id number]  . docusate sodium  100 mg Oral BID  . enoxaparin (LOVENOX) injection  40 mg Subcutaneous Q24H  . folic acid  1 mg Oral Daily  . loratadine  10 mg Oral Daily  . omega-3 acid ethyl esters  1 g Oral QHS  . saccharomyces boulardii  250 mg Oral Daily  . valACYclovir  1,000 mg Oral Daily  . vitamin B-12  1,000 mcg Oral BID   Continuous Infusions:  Principal Problem:   Near syncope Active Problems:   CKD (chronic kidney disease), stage III ([place])   LOS: 0 days

## 2017-09-15 NOTE — Progress Notes (Signed)
Central tele called staff and informed us that the patient had 5 runs of V-tach. Patient is asymptomatic, no complaints. MD made aware. Will continue to monitor.

## 2017-09-15 NOTE — Therapy (Signed)
[facility] [address] [place], [place], [address] Phone: [phone]   Fax:  [phone]  Physical Therapy Treatment  Patient Details  Name: Derek Carr MRN: [id number] Date of Birth: 82-Aug-1933 Referring Provider: Mechele Claude, PA   Encounter Date: 09/14/2017  PT End of Session - 09/14/17 1023    Visit Number  12    Number of Visits  27    Date for PT Re-Evaluation  10/26/17    Authorization Type  [employer] & Generic commercial 2nd    PT Start Time  1020    PT Stop Time  1100    PT Time Calculation (min)  40 min    Equipment Utilized During Treatment  Gait belt    Activity Tolerance  Patient tolerated treatment well;No increased pain    Behavior During Therapy  WFL for tasks assessed/performed       Past Medical History:  Diagnosis Date  . AAA (abdominal aortic aneurysm) ([place])   . Anemia   . Ankle fracture    left  . Aortic regurgitation   . Atherosclerosis   . Complication of anesthesia    CONFUSION - Pt's family very concerned about this  . DDD (degenerative disc disease)   . Degenerative arthritis   . Dermatitis, atopic ARMS AND LEGS  . Diverticulosis   . Erectile dysfunction   . Frequency of urination   . Glaucoma   . History of asbestos exposure   . History of radiation therapy 8/19-13-10/18/11   prostate, 84 GY  . History of shingles 2012-- BILATERAL EYES--  NO RESIDUAL  . Hyperlipidemia   . Hypertension   . Inguinal hernia   . Lumbar scoliosis   . Nocturia   . OA (osteoarthritis) of hip RIGHT  . Prostate cancer ([place]) 05/11/2011   bx=Adenocarcinoma,gleason3+4=7, 4+4=8,PSA=10.30volume=45.7cc  . Renal cyst    bilateral  . Smokers' cough ([place])   . Stroke ([place])   . Thyroid cyst     Past Surgical History:  Procedure Laterality Date  . ATTEMPTED LEFT VATS/ LEFT THORACOTOMY/ RESECTION OF THE ENORMOUS, PROBABLE BRONCHOGENIC CYST  01-04-2005  DR Arlyce Dice  . CARDIAC CATHETERIZATION   02-24-2009  DR NASHER   MINOR CORONARY ARTERY IRREGULARITIES/ NORMAL LVSF/ EF 65-70%  . CATARACT EXTRACTION    . COLONOSCOPY W/ POLYPECTOMY    . CYSTOSCOPY  11/15/2011   Procedure: CYSTOSCOPY;  Surgeon: Dutch Gray, MD;  Location[facility];  Service: Urology;  Laterality: N/A;  no seeds seen in bladder  . esophageus cyst removal  YRS AGO  . [place]  . ORIF ANKLE FRACTURE Left 05/03/2017  . ORIF ANKLE FRACTURE Left 05/03/2017   Procedure: OPEN REDUCTION INTERNAL FIXATION (ORIF) BIMALLEOLAR  ANKLE FRACTURE;  Surgeon: Wylene Simmer, MD;  Location: [facility];  Service: Orthopedics;  Laterality: Left;  . PROSTATE BIOPSY  05/11/11   Adenocarcinoma ([facility])  . RADIOACTIVE SEED IMPLANT  11/15/2011   Procedure: RADIOACTIVE SEED IMPLANT;  Surgeon: Dutch Gray, MD;  Location: [facility];  Service: Urology;  Laterality: N/A;  Total number of seeds - 52  . REPAIR RIGHT INGUINAL HERNIA W/ MESH  09-10-1999  . TOTAL HIP ARTHROPLASTY Right 03/20/2013   Procedure: RIGHT TOTAL HIP ARTHROPLASTY ANTERIOR APPROACH;  Surgeon: [name], MD;  Location: [facility];  Service: Orthopedics;  Laterality: Right;  . TOTAL HIP REVISION Right 05/14/2013   Procedure: open reduction internal fixation REVISION RIGHT HIP ;  Surgeon:  [name], MD;  Location: [facility];  Service: Orthopedics;  Laterality: Right;  . UMBILICAL HERNIA REPAIR  1998   epigastric    There were no vitals filed for this visit.  Subjective Assessment - 09/14/17 1020    Subjective  NO falls. He has been focusing on positioning within RW like PT recommended.     Pertinent History  left ankle fracture, TIAs, DDD, Lumbar scoliosis, Glaucoma, cataract surgery, HTN, right THR 03/20/13 with revision 05/14/13, right femur fracture, dementia/Alzheimers    Limitations  Standing;Walking;House hold activities    Patient Stated Goals  Patient wants to improve his balance & walking    Currently in Pain?  No/denies                        [facility] Adult PT Treatment/Exercise - 09/14/17 1015      Ambulation/Gait   Ambulation/Gait  Yes    Ambulation/Gait Assistance  5: Supervision;4: Min assist    Ambulation/Gait Assistance Details  Tactile & verbal cues on upright posture, foot clearance & sequencing cane.  Worked on step length & clearance stepping over 2" markers placed 3' apart with step thru stride rt & lt between each marker     Ambulation Distance (Feet)  300 Feet   300' X 3, last 300' outdoors 40' on grass   Assistive device  Straight cane   quad tip   Gait Pattern  Step-through pattern;Decreased stance time - left;Decreased weight shift to left;Lateral hip instability;Wide base of support;Decreased stride length;Decreased step length - left;Poor foot clearance - right    Ambulation Surface  Indoor;Level;Outdoor;Paved;Grass    Ramp  4: Min assist   cane quad tip   Curb  4: Min assist   cane quad tip     High Level Balance   High Level Balance Activities  Head turns   scanning rt/lt & up/down gait with cane in hall (visual)   High Level Balance Comments  tactile & verbal cues on maintaining path & balance reactions          Balance Exercises - 09/14/17 1015      Balance Exercises: Standing   Standing Eyes Opened  Wide (BOA);Head turns;Solid surface;5 reps    Standing Eyes Closed  Wide (BOA);Solid surface;10 secs;2 reps    Stepping Strategy  Anterior;Posterior;Lateral;Foam/compliant surface;5 reps   alt stepping on/stabilze/off; minA /tactile cues   Probation officer;Lateral;EO;Head turns;5 reps;Intermittent UE support   wt shifts, stabilization, recovery         PT Short Term Goals - 09/08/17 0948      PT SHORT TERM GOAL #1   Title  Pt will be independent with updated HEP exercises (All STGs Target Date 09/30/2017)    Baseline        Target Date  09/30/17      PT SHORT TERM GOAL #2   Title  Patient ambulates 250' paved surfaces with cane with  supervision.     Baseline        Status  On-going    Target Date  09/30/17      PT SHORT TERM GOAL #3   Title  Timed Up & Go with cane <18sec with supervision.     Baseline        Status  On-going    Target Date  09/30/17      PT SHORT TERM GOAL #4   Title  Patient standing balance reaching 10" anteriorly, to floor and looks  over shoulders without balance loss with supervision.     Baseline        Target Date  09/30/17      PT SHORT TERM GOAL #5   Title  Patient negotiates ramps & curbs with cane with min guard.     Status  New    Target Date  09/30/17        PT Long Term Goals - 09/07/17 1230      PT LONG TERM GOAL #1   Title  Pt will demonstrate independence with ongoing HEP program and fitness plan (All LTGs Target Date 10/26/2017)    Time  90    Period  Days    Status  On-going    Target Date  10/26/17      PT LONG TERM GOAL #2   Title  Patient ambulates Modified Independent 500' outdoors including grass, pavement, curbs, ramps with cane to improve community ambulatory skills    Time  90    Period  Days    Status  On-going    Target Date  10/26/17      PT LONG TERM GOAL #3   Title  Berg Balance >/= 45/56 to indicate lower fall risk.     Time  90    Period  Days    Status  On-going    Target Date  10/26/17      PT LONG TERM GOAL #4   Title  Timed Up-Go time with cane <13.5 seconds to indicate lower fall risk.     Time  90    Period  Days    Status  On-going    Target Date  10/26/17            Plan - 09/14/17 1800    Clinical Impression Statement  Today's skilled session focused on gait with cane including scanning & negotiating obstacles and balance reactions facilitating ankle, hip & step strategies.     Rehab Potential  Good    PT Frequency  2x / week    PT Duration  Other (comment)   13 weeks (90 days)   PT Treatment/Interventions  Gait training;Functional mobility training;Neuromuscular re-education;Balance training;Therapeutic  exercise;Therapeutic activities;Patient/family education;ADLs/Self Care Home Management;Stair training;Orthotic Fit/Training;Manual techniques;Vestibular    PT Next Visit Plan  work towards updated STGs, Activity tolerance training; dynamic standing  balance; LE strengthening; gait with SPC.    Consulted and Agree with Plan of Care  Patient       Patient will benefit from skilled therapeutic intervention in order to improve the following deficits and impairments:  Abnormal gait, Decreased activity tolerance, Decreased balance, Decreased endurance, Decreased knowledge of use of DME, Decreased mobility, Decreased range of motion, Decreased safety awareness, Decreased strength, Impaired flexibility, Postural dysfunction  Visit Diagnosis: Muscle weakness (generalized)  Unsteadiness on feet  Other abnormalities of gait and mobility     Problem List Patient Active Problem List   Diagnosis Date Noted  . Near syncope 09/14/2017  . Slurred speech 09/14/2017  . Renal insufficiency 09/14/2017  . Post-operative pain   . Dementia without behavioral disturbance   . TIA (transient ischemic attack)   . Reactive hypertension   . Acute blood loss anemia   . Bimalleolar fracture of left ankle 05/03/2017  . Axonal neuropathy 03/21/2017  . Right sided weakness 03/01/2017  . Cigarette smoker 06/16/2016  . Dyspnea on exertion 06/15/2016  . Alzheimer disease 01/07/2014  . CSA (central sleep apnea) 10/05/2013  . Syncope 06/26/2013  . COPD  GOLD II 06/25/2013  . BPH (benign prostatic hyperplasia) 05/18/2013  . Glaucoma 05/18/2013  . S/P right TH revision 05/14/2013  . Constipation 03/26/2013  . Osteoporosis 03/26/2013  . Anemia 03/23/2013  . S/P right THA, AA 03/20/2013  . Hyperlipidemia   . Erectile dysfunction   . History of asbestos exposure   . Nocturia   . History of shingles   . Dermatitis, atopic   . OA (osteoarthritis) of hip   . Arthritis   . Frequency of urination   . Lumbar  scoliosis   . Malignant neoplasm of prostate ([place]) 06/07/2011  . 84 year old gentleman with stage T3 adenocarcinoma prostate with Gleason score 4+4 and PSA of 10.3 05/11/2011    Kimber Esterly PT, DPT 09/15/2017, 6:07 AM  [facility] [address] [place], [place], [address] Phone: [phone]   Fax:  [phone]  Name: ERROLL WILBOURNE MRN: [id number] Date of Birth: 02-24-1931

## 2017-09-15 NOTE — Progress Notes (Signed)
At rest patient's HR is in low 60s. On ambulation, HR is fluctuating between low 40s to high 50s. Will continue to monitor.

## 2017-09-15 NOTE — Procedures (Signed)
Derek A. Merlene Laughter, MD     [email or web address]           HISTORY: The patient 84 year old male who presents with episode of syncope associated with speech alteration and altered sensorium.  This study is being done to evaluate for nonconvulsive seizure as the etiology.  MEDICATIONS: Scheduled Meds: . aspirin EC  81 mg Oral Daily  . calcium carbonate  1 tablet Oral Q breakfast  . cholecalciferol  1,000 Units Oral BID  . clobetasol cream  1 application Topical [id number]  . docusate sodium  100 mg Oral BID  . enoxaparin (LOVENOX) injection  40 mg Subcutaneous Q24H  . folic acid  1 mg Oral Daily  . irbesartan  37.5 mg Oral Daily  . loratadine  10 mg Oral Daily  . omega-3 acid ethyl esters  1 g Oral QHS  . saccharomyces boulardii  250 mg Oral Daily  . valACYclovir  1,000 mg Oral Daily  . vitamin B-12  1,000 mcg Oral BID   Continuous Infusions: PRN Meds:.  Prior to Admission medications   Medication Sig Start Date End Date Taking? Authorizing Provider  acetaminophen (TYLENOL) 500 MG tablet Take 1,000 mg by mouth 2 (two) times daily.    Yes [provider]  aspirin EC 81 MG tablet Take 81 mg by mouth daily.   Yes [provider]  Calcium Carbonate Antacid (TUMS PO) Take 1 tablet by mouth daily.   Yes [provider]  cetirizine (ZYRTEC) 10 MG tablet Take 10 mg by mouth daily.   Yes [provider]  Cholecalciferol (VITAMIN D-3) 1000 units CAPS Take 1 capsule by mouth 2 (two) times daily.    Yes [provider]  Cyanocobalamin (VITAMIN B12) 1000 MCG TBCR Take 1 tablet by mouth 2 (two) times daily.    Yes [provider]  Docosahexaenoic Acid (DHA OMEGA 3 PO) Take 1,000 mg by mouth daily.    Yes [provider]  docusate sodium (COLACE) 100 MG capsule Take 1 capsule (100 mg total) by mouth 2 (two) times daily. While taking narcotic pain medicine. 05/04/17  Yes Corky Sing, PA-C  folic acid  (FOLVITE) 1 MG tablet Take 1 mg by mouth daily.   Yes [provider]  irbesartan (AVAPRO) 75 MG tablet Take 37.5 mg by mouth daily. **REPLACES VALSARTAN** 11/28/16  Yes [provider]  Omega 3-6-9 Fatty Acids (OMEGA DHA PO) Take 1 tablet by mouth at bedtime.    Yes [provider]  Probiotic Product (PROBIOTIC PO) Take 1 capsule by mouth daily.    Yes [provider]  senna (SENOKOT) 8.6 MG TABS tablet Take 2 tablets (17.2 mg total) by mouth 2 (two) times daily. 05/04/17  Yes Corky Sing, PA-C  Ubiquinol (QUNOL COQ10/UBIQUINOL/MEGA) 100 MG CAPS Take 1 tablet by mouth at bedtime.    Yes [provider]  valACYclovir (VALTREX) 1000 MG tablet Take 1,000 mg by mouth daily.   Yes [provider]  clobetasol cream (TEMOVATE) 6.75 % Apply 1 application topically daily at 12 noon. 09/07/17   [provider]      ANALYSIS: A 16 channel recording using standard 10 20 measurements is conducted for 25 minutes.   There is a well-formed posterior dominant rhythm of 9 hertz which attenuates with eye opening.  There is a beta activity observed in the frontal areas.  Awake and drowsy activities are documented.  Photic stimulation and hyperventilation are not performed.  There is no  focal or lateralized slowing.  There is no epileptiform activity is observed.   IMPRESSION: 1.  This is a normal recording of awake and drowsy states.      Geroldine Esquivias A. Merlene Carr, M.D.  Diplomate, Tax adviser of Psychiatry and Neurology ( Neurology).

## 2017-09-15 NOTE — Consult Note (Signed)
Cardiology Consultation:   Carr ID: Derek Carr; [id number]; Jun 20, 1931   Admit date: 09/14/2017 Date of Consult: 09/15/2017  Primary Care Provider: [name], MD Primary Cardiologist: No primary care provider on file. GT Primary Electrophysiologist:  GT   Carr Profile:   Derek Carr is a 84 y.o. male with a hx of TIA's who is being seen today for Derek evaluation of sinus bradycardia at Derek request of Dr. Sarajane Jews.  History of Present Illness:   Derek Carr is referred today for evaluation and management of fatigue and sinus bradycardia. He was in Derek hospital several months ago with a stroke and did not get a TEE or an ILR inserted. He had right sided weakness with his initial presentation in February. Fortunately his symptoms resolved. He has had a total of 3 episodes. He has not had palpitations. He has had an echo which showed diastolic dysfunction and low normal EF. His atrial were ok. He developed worsening fatigue and weakness and was admitted for additional evaluation. Derek Carr was noted to have daytime bradycardia while in bed. Hr's are in Derek 50's but occaisionally dip into Derek low 40's and even high 30's. He has had a 2D echo which is essentially normal. While he has had episodes associated with near syncope, there has been no correlation with marked bradycardia and symptoms. When I saw Derek Carr as an outpatient, I recommended an ILR for recurrent TIA's but this has not yet occurred.   Past Medical History:  Diagnosis Date  . AAA (abdominal aortic aneurysm) ([place])   . Anemia   . Ankle fracture    left  . Aortic regurgitation   . Atherosclerosis   . Complication of anesthesia    CONFUSION - Pt's family very concerned about this  . DDD (degenerative disc disease)   . Degenerative arthritis   . Dermatitis, atopic ARMS AND LEGS  . Diverticulosis   . Erectile dysfunction   . Frequency of urination   . Glaucoma   . History of asbestos exposure   .  History of radiation therapy 8/19-13-10/18/11   prostate, 82 GY  . History of shingles 2012-- BILATERAL EYES--  NO RESIDUAL  . Hyperlipidemia   . Hypertension   . Inguinal hernia   . Lumbar scoliosis   . Nocturia   . OA (osteoarthritis) of hip RIGHT  . Prostate cancer ([place]) 05/11/2011   bx=Adenocarcinoma,gleason3+4=7, 4+4=8,PSA=10.30volume=45.7cc  . Renal cyst    bilateral  . Smokers' cough ([place])   . Stroke ([place])   . Thyroid cyst     Past Surgical History:  Procedure Laterality Date  . ATTEMPTED LEFT VATS/ LEFT THORACOTOMY/ RESECTION OF Derek ENORMOUS, PROBABLE BRONCHOGENIC CYST  01-04-2005  DR Arlyce Dice  . CARDIAC CATHETERIZATION  02-24-2009  DR NASHER   MINOR CORONARY ARTERY IRREGULARITIES/ NORMAL LVSF/ EF 65-70%  . CATARACT EXTRACTION    . COLONOSCOPY W/ POLYPECTOMY    . CYSTOSCOPY  11/15/2011   Procedure: CYSTOSCOPY;  Surgeon: Dutch Gray, MD;  Location: [facility];  Service: Urology;  Laterality: N/A;  no seeds seen in bladder  . esophageus cyst removal  YRS AGO  . [place]  . ORIF ANKLE FRACTURE Left 05/03/2017  . ORIF ANKLE FRACTURE Left 05/03/2017   Procedure: OPEN REDUCTION INTERNAL FIXATION (ORIF) BIMALLEOLAR  ANKLE FRACTURE;  Surgeon: Wylene Simmer, MD;  Location: [facility];  Service: Orthopedics;  Laterality: Left;  . PROSTATE BIOPSY  05/11/11   Adenocarcinoma ([facility] OFFICE)  .  RADIOACTIVE SEED IMPLANT  11/15/2011   Procedure: RADIOACTIVE SEED IMPLANT;  Surgeon: Dutch Gray, MD;  Location: [facility];  Service: Urology;  Laterality: N/A;  Total number of seeds - 52  . REPAIR RIGHT INGUINAL HERNIA W/ MESH  09-10-1999  . TOTAL HIP ARTHROPLASTY Right 03/20/2013   Procedure: RIGHT TOTAL HIP ARTHROPLASTY ANTERIOR APPROACH;  Surgeon: [name], MD;  Location: [facility];  Service: Orthopedics;  Laterality: Right;  . TOTAL HIP REVISION Right 05/14/2013   Procedure: open reduction internal fixation REVISION RIGHT HIP ;  Surgeon: [name],  MD;  Location: [facility];  Service: Orthopedics;  Laterality: Right;  . UMBILICAL HERNIA REPAIR  1998   epigastric     Home Medications:  Prior to Admission medications   Medication Sig Start Date End Date Taking? Authorizing Provider  acetaminophen (TYLENOL) 500 MG tablet Take 1,000 mg by mouth 2 (two) times daily.    Yes [provider]  aspirin EC 81 MG tablet Take 81 mg by mouth daily.   Yes [provider]  Calcium Carbonate Antacid (TUMS PO) Take 1 tablet by mouth daily.   Yes [provider]  cetirizine (ZYRTEC) 10 MG tablet Take 10 mg by mouth daily.   Yes [provider]  Cholecalciferol (VITAMIN D-3) 1000 units CAPS Take 1 capsule by mouth 2 (two) times daily.    Yes [provider]  Cyanocobalamin (VITAMIN B12) 1000 MCG TBCR Take 1 tablet by mouth 2 (two) times daily.    Yes [provider]  Docosahexaenoic Acid (DHA OMEGA 3 PO) Take 1,000 mg by mouth daily.    Yes [provider]  docusate sodium (COLACE) 100 MG capsule Take 1 capsule (100 mg total) by mouth 2 (two) times daily. While taking narcotic pain medicine. 05/04/17  Yes Corky Sing, PA-C  folic acid (FOLVITE) 1 MG tablet Take 1 mg by mouth daily.   Yes [provider]  irbesartan (AVAPRO) 75 MG tablet Take 37.5 mg by mouth daily. **REPLACES VALSARTAN** 11/28/16  Yes [provider]  Omega 3-6-9 Fatty Acids (OMEGA DHA PO) Take 1 tablet by mouth at bedtime.    Yes [provider]  Probiotic Product (PROBIOTIC PO) Take 1 capsule by mouth daily.    Yes [provider]  senna (SENOKOT) 8.6 MG TABS tablet Take 2 tablets (17.2 mg total) by mouth 2 (two) times daily. 05/04/17  Yes Corky Sing, PA-C  Ubiquinol (QUNOL COQ10/UBIQUINOL/MEGA) 100 MG CAPS Take 1 tablet by mouth at bedtime.    Yes [provider]  valACYclovir (VALTREX) 1000 MG tablet Take 1,000 mg by mouth daily.   Yes [provider]  clobetasol  cream (TEMOVATE) 0.97 % Apply 1 application topically daily at 12 noon. 09/07/17   [provider]    Inpatient Medications: Scheduled Meds: . aspirin EC  81 mg Oral Daily  . calcium carbonate  1 tablet Oral Q breakfast  . cholecalciferol  1,000 Units Oral BID  . clobetasol cream  1 application Topical [id number]  . docusate sodium  100 mg Oral BID  . enoxaparin (LOVENOX) injection  40 mg Subcutaneous Q24H  . folic acid  1 mg Oral Daily  . loratadine  10 mg Oral Daily  . omega-3 acid ethyl esters  1 g Oral QHS  . saccharomyces boulardii  250 mg Oral Daily  . valACYclovir  1,000 mg Oral Daily  . vitamin B-12  1,000 mcg Oral BID   Continuous  Infusions:  PRN Meds:   Allergies:    Allergies  Allergen Reactions  . Oxycodone Other (See Comments)  . Dilaudid [Hydromorphone Hcl] Other (See Comments)    Confusion   . Methocarbamol Other (See Comments)    Drowsiness   . Percodan [Oxycodone-Aspirin] Other (See Comments)    Confusion   . Tramadol Other (See Comments)    Drowsiness   . Vicodin [Hydrocodone-Acetaminophen] Other (See Comments)    Confusion     Social History:   Social History   Socioeconomic History  . Marital status: Married    Spouse name: Alison Murray  . Number of children: 4  . Years of education: College  . Highest education level: Not on file  Occupational History  . Occupation:       Employer: [employer]    Comment: Retired  Scientific laboratory technician  . Financial resource strain: Not on file  . Food insecurity:    Worry: Not on file    Inability: Not on file  . Transportation needs:    Medical: Not on file    Non-medical: Not on file  Tobacco Use  . Smoking status: Former Smoker    Packs/day: 0.50    Years: 62.00    Pack years: 31.00    Types: Cigarettes  . Smokeless tobacco: Never Used  . Tobacco comment: smoke one or two per day  Substance and Sexual Activity  . Alcohol use: No    Alcohol/week: 1.0 standard drinks    Types: 1 Cans of beer  per week  . Drug use: No    Comment: quit smoking 02/2012  . Sexual activity: Not on file  Lifestyle  . Physical activity:    Days per week: Not on file    Minutes per session: Not on file  . Stress: Not on file  Relationships  . Social connections:    Talks on phone: Not on file    Gets together: Not on file    Attends religious service: Not on file    Active member of club or organization: Not on file    Attends meetings of clubs or organizations: Not on file    Relationship status: Not on file  . Intimate partner violence:    Fear of current or ex partner: Not on file    Emotionally abused: Not on file    Physically abused: Not on file    Forced sexual activity: Not on file  Other Topics Concern  . 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   Carr has a college education.   Carr is right-handed.   Carr drinks one cup of coffee, 1-2 cups of tea daily and rarely drinks soda.   Retired from [employer] History:    Family History  Problem Relation Age of Onset  . Hypertension Mother   . Prostate cancer Brother        surgery/cured  . Lung cancer Brother        new primary/same brother     ROS:  Please see Derek history of present illness.   All other ROS reviewed and negative.     Physical Exam/Data:   Vitals:   09/15/17 0536 09/15/17 0913 09/15/17 1026 09/15/17 1622  BP: (!) 176/87 (!) 182/106 (!) 144/85 125/72  Pulse: (!) 47   61  Resp: 17  17  Temp: 97.8 F (36.6 C)   97.9 F (36.6 C)  TempSrc:    Oral  SpO2:    100%  Weight:      Height:        Intake/Output Summary (Last 24 hours) at 09/15/2017 1640 Last data filed at 09/15/2017 1030 Gross per 24 hour  Intake 120 ml  Output -  Net 120 ml   Filed Weights   09/14/17 1220 09/15/17 0300  Weight: 81.6 kg 81.1 kg   Body mass index is 22.35 kg/m.  General:  Well nourished, well  developed, in no acute distress HEENT: normal Lymph: no adenopathy Neck: 7 cm JVD Endocrine:  No thryomegaly Vascular: No carotid bruits; FA pulses 2+ bilaterally without bruits  Cardiac:  normal S1, S2; RRR; no murmur  Lungs:  clear to auscultation bilaterally, no wheezing, rhonchi or rales  Abd: soft, nontender, no hepatomegaly  Ext: no edema Musculoskeletal:  No deformities, BUE and BLE strength normal and equal Skin: warm and dry  Neuro:  CNs 2-12 intact, no focal abnormalities noted Psych:  Normal affect   EKG:  Derek EKG was personally reviewed and demonstrates:  Sinus bradycardia Telemetry:  Telemetry was personally reviewed and demonstrates:  Sinus bradycardia  Relevant CV Studies: 2D echo - normal LV function.   Laboratory Data:  Chemistry Recent Labs  Lab 09/14/17 1252 09/15/17 0637  NA 141 141  K 3.9 3.7  CL 112* 111  CO2 23 24  GLUCOSE 99 81  BUN 17 14  CREATININE 1.65* 1.33*  CALCIUM 9.4 8.9  GFRNONAA 37* 48*  GFRAA 43* 55*  ANIONGAP 6 6    Recent Labs  Lab 09/15/17 0637  PROT 6.8  ALBUMIN 2.9*  AST 18  ALT 18  ALKPHOS 68  BILITOT 0.4   Hematology Recent Labs  Lab 09/14/17 1252 09/15/17 0637  WBC 3.5* 3.2*  RBC 4.38 4.11*  HGB 11.9* 11.4*  HCT 38.4* 35.7*  MCV 87.7 86.9  MCH 27.2 27.7  MCHC 31.0 31.9  RDW 15.6* 15.3  PLT 178 171   Cardiac Enzymes Recent Labs  Lab 09/14/17 2057 09/15/17 0140 09/15/17 0637  TROPONINI <0.03 <0.03 <0.03   No results for input(s): TROPIPOC in Derek last 168 hours.  BNPNo results for input(s): BNP, PROBNP in Derek last 168 hours.  DDimer No results for input(s): DDIMER in Derek last 168 hours.  Radiology/Studies:  Ct Head Wo Contrast  Result Date: 09/14/2017 CLINICAL DATA:  Near syncopal episode. Residual left-sided deficits from previous TIA. EXAM: CT HEAD WITHOUT CONTRAST TECHNIQUE: Contiguous axial images were obtained from Derek base of Derek skull through Derek vertex without intravenous contrast.  COMPARISON:  03/01/2017 FINDINGS: Brain: Ventricles, cisterns and other CSF spaces are within normal. There is no mass, mass effect, shift of midline structures or acute hemorrhage. No evidence of acute infarction. Mild basal ganglia calcifications are present. No acute infarction. Vascular: No hyperdense vessel or unexpected calcification. Skull: Normal. Negative for fracture or focal lesion. Sinuses/Orbits: Orbits are normal. Paranasal sinuses are clear. There is opacification over Derek inferior right mastoid air cells unchanged. Other: None. IMPRESSION: No acute findings. Opacification over Derek inferior right mastoid air cells unchanged. Electronically Signed   By: Marin Olp M.D.   On: 09/14/2017 16:11   Mr Brain Wo Contrast  Result Date: 09/14/2017 CLINICAL DATA:  84 y/o  M; near syncopal event. EXAM: MRI HEAD WITHOUT CONTRAST TECHNIQUE: Multiplanar, multiecho pulse sequences of Derek brain and surrounding structures were obtained without  intravenous contrast. COMPARISON:  03/02/2017 MRI head.  09/14/2017 CT head. FINDINGS: Brain: No acute infarction, hemorrhage, hydrocephalus, extra-axial collection or mass lesion. Stable nonspecific T2 FLAIR hyperintensities in subcortical and periventricular white matter as well as Derek pons are compatible with mild chronic microvascular ischemic changes for age. Mild stable volume loss of Derek brain. Vascular: Normal flow voids. Skull and upper cervical spine: Normal marrow signal. Sinuses/Orbits: Mild paranasal sinus mucosal thickening. Right mastoid opacification. Bilateral intra-ocular lens replacement. Other: None. IMPRESSION: 1. No acute intracranial abnormality identified. 2. Stable mild chronic microvascular ischemic changes and volume loss of Derek brain for age. Electronically Signed   By: Kristine Garbe M.D.   On: 09/14/2017 20:49    Assessment and Plan:   1. Near syncope - we have not correlated his HR with his symptoms. I have asked that Derek  Carr be ambulated in Derek hall. If his HR does not increase appropriately, then PPM will be required. If his HR does increase, then he will need an ILR to evaluated for cryptogenic stroke. I will follow up with her in Derek a.m.   For questions or updates, please contact [place] Please consult [email or web address] for contact info under Cardiology/STEMI.   Signed, Cristopher Peru, MD  09/15/2017 4:40 PM

## 2017-09-15 NOTE — Progress Notes (Signed)
Pt arrived to unit by w/c. Pt alert and oriented X 4. Pt on tele box 20 SB. Pt denied pain but asked to get some rest. Pt is resting in bed at this time will continue to monitor for any change.

## 2017-09-15 NOTE — Progress Notes (Signed)
  Echocardiogram 2D Echocardiogram has been performed.  Derek Carr F 09/15/2017, 11:26 AM

## 2017-09-16 ENCOUNTER — Ambulatory Visit (HOSPITAL_COMMUNITY): Admit: 2017-09-16 | Payer: Medicare HMO | Admitting: Cardiology

## 2017-09-16 ENCOUNTER — Encounter (HOSPITAL_COMMUNITY)
Admission: EM | Disposition: A | Discharge status: Home / Self Care | Payer: Self-pay | Source: Home / Self Care | Attending: Internal Medicine

## 2017-09-16 DIAGNOSIS — R001 Bradycardia, unspecified: Principal | ICD-10-CM

## 2017-09-16 DIAGNOSIS — N183 Chronic kidney disease, stage 3 (moderate): Secondary | ICD-10-CM

## 2017-09-16 DIAGNOSIS — I498 Other specified cardiac arrhythmias: Secondary | ICD-10-CM

## 2017-09-16 HISTORY — PX: PACEMAKER IMPLANT: EP1218

## 2017-09-16 LAB — COMPREHENSIVE METABOLIC PANEL
ALK PHOS: 70 U/L (ref 38–126)
ALT: 17 U/L (ref 0–44)
ANION GAP: 10 (ref 5–15)
AST: 19 U/L (ref 15–41)
Albumin: 3.1 g/dL — ABNORMAL LOW (ref 3.5–5.0)
BILIRUBIN TOTAL: 0.7 mg/dL (ref 0.3–1.2)
BUN: 17 mg/dL (ref 8–23)
CO2: 19 mmol/L — AB (ref 22–32)
CREATININE: 1.32 mg/dL — AB (ref 0.61–1.24)
Calcium: 9.2 mg/dL (ref 8.9–10.3)
Chloride: 111 mmol/L (ref 98–111)
GFR, EST AFRICAN AMERICAN: 56 mL/min — AB (ref >60)
GFR, EST NON AFRICAN AMERICAN: 48 mL/min — AB (ref >60)
Glucose, Bld: 146 mg/dL — ABNORMAL HIGH (ref 70–99)
Potassium: 3.5 mmol/L (ref 3.5–5.1)
Sodium: 140 mmol/L (ref 135–145)
TOTAL PROTEIN: 6.7 g/dL (ref 6.5–8.1)

## 2017-09-16 LAB — CBC
HCT: 37 % — ABNORMAL LOW (ref 39.0–52.0)
Hemoglobin: 11.8 g/dL — ABNORMAL LOW (ref 13.0–17.0)
MCH: 27.6 pg (ref 26.0–34.0)
MCHC: 31.9 g/dL (ref 30.0–36.0)
MCV: 86.7 fL (ref 78.0–100.0)
PLATELETS: 172 10*3/uL (ref 150–400)
RBC: 4.27 MIL/uL (ref 4.22–5.81)
RDW: 15.5 % (ref 11.5–15.5)
WBC: 7.6 10*3/uL (ref 4.0–10.5)

## 2017-09-16 LAB — GLUCOSE, CAPILLARY
GLUCOSE-CAPILLARY: 57 mg/dL — AB (ref 70–99)
Glucose-Capillary: 65 mg/dL — ABNORMAL LOW (ref 70–99)
Glucose-Capillary: 110 mg/dL — ABNORMAL HIGH (ref 70–99)

## 2017-09-16 LAB — SURGICAL PCR SCREEN
MRSA, PCR: NEGATIVE
STAPHYLOCOCCUS AUREUS: NEGATIVE

## 2017-09-16 LAB — MAGNESIUM: MAGNESIUM: 1.6 mg/dL — AB (ref 1.7–2.4)

## 2017-09-16 SURGERY — PACEMAKER IMPLANT

## 2017-09-16 MED ORDER — CEFAZOLIN SODIUM-DEXTROSE 2-4 GM/100ML-% IV SOLN
2.0000 g | INTRAVENOUS | Status: AC
  Administered 2017-09-16: 2 g via INTRAVENOUS
  Filled 2017-09-16: qty 100

## 2017-09-16 MED ORDER — CEFAZOLIN SODIUM-DEXTROSE 2-4 GM/100ML-% IV SOLN
INTRAVENOUS | Status: AC
  Filled 2017-09-16: qty 100

## 2017-09-16 MED ORDER — SODIUM CHLORIDE 0.9 % IV SOLN: INTRAVENOUS | Status: DC

## 2017-09-16 MED ORDER — CEFAZOLIN SODIUM-DEXTROSE 1-4 GM/50ML-% IV SOLN
1.0000 g | Freq: Three times a day (TID) | INTRAVENOUS | Status: AC
  Administered 2017-09-16 – 2017-09-17 (×3): 1 g via INTRAVENOUS
  Filled 2017-09-16 (×3): qty 50

## 2017-09-16 MED ORDER — HEPARIN (PORCINE) IN NACL 1000-0.9 UT/500ML-% IV SOLN
INTRAVENOUS | Status: AC
  Filled 2017-09-16: qty 500

## 2017-09-16 MED ORDER — AMLODIPINE BESYLATE 2.5 MG PO TABS
2.5000 mg | ORAL_TABLET | Freq: Every day | ORAL | Status: DC
  Administered 2017-09-17: 2.5 mg via ORAL
  Filled 2017-09-16: qty 1

## 2017-09-16 MED ORDER — SODIUM CHLORIDE 0.9 % IV SOLN
80.0000 mg | INTRAVENOUS | Status: AC
  Administered 2017-09-16: 80 mg
  Filled 2017-09-16: qty 2

## 2017-09-16 MED ORDER — LIDOCAINE HCL 1 % IJ SOLN
INTRAMUSCULAR | Status: AC
  Filled 2017-09-16: qty 20

## 2017-09-16 MED ORDER — HEPARIN (PORCINE) IN NACL 1000-0.9 UT/500ML-% IV SOLN
INTRAVENOUS | Status: DC | PRN
  Administered 2017-09-16: 500 mL

## 2017-09-16 MED ORDER — LIDOCAINE HCL (PF) 1 % IJ SOLN
INTRAMUSCULAR | Status: DC | PRN
  Administered 2017-09-16: 60 mL

## 2017-09-16 MED ORDER — SODIUM CHLORIDE 0.9 % IV SOLN
INTRAVENOUS | Status: DC
  Administered 2017-09-16: 19:00:00 via INTRAVENOUS

## 2017-09-16 MED ORDER — ONDANSETRON HCL 4 MG/2ML IJ SOLN: 4.0000 mg | Freq: Four times a day (QID) | INTRAMUSCULAR | Status: DC | PRN

## 2017-09-16 MED ORDER — CHLORHEXIDINE GLUCONATE 4 % EX LIQD
60.0000 mL | Freq: Once | CUTANEOUS | Status: AC
  Administered 2017-09-16: 4 via TOPICAL
  Filled 2017-09-16: qty 60

## 2017-09-16 MED ORDER — MUPIROCIN 2 % EX OINT: 1.0000 "application " | TOPICAL_OINTMENT | Freq: Two times a day (BID) | CUTANEOUS | Status: DC

## 2017-09-16 MED ORDER — HYDRALAZINE HCL 20 MG/ML IJ SOLN
10.0000 mg | Freq: Once | INTRAMUSCULAR | Status: AC
  Administered 2017-09-16: 10 mg via INTRAVENOUS

## 2017-09-16 MED ORDER — HYDRALAZINE HCL 20 MG/ML IJ SOLN
10.0000 mg | Freq: Four times a day (QID) | INTRAMUSCULAR | Status: DC | PRN
  Administered 2017-09-17: 10 mg via INTRAVENOUS
  Filled 2017-09-16: qty 1

## 2017-09-16 MED ORDER — SODIUM CHLORIDE 0.9% FLUSH: 3.0000 mL | Freq: Two times a day (BID) | INTRAVENOUS | Status: DC

## 2017-09-16 MED ORDER — SODIUM CHLORIDE 0.9% FLUSH
3.0000 mL | INTRAVENOUS | Status: DC | PRN
  Administered 2017-09-16 (×2): 3 mL via INTRAVENOUS
  Filled 2017-09-16 (×2): qty 3

## 2017-09-16 MED ORDER — SODIUM CHLORIDE 0.9 % IV SOLN: 250.0000 mL | INTRAVENOUS | Status: DC

## 2017-09-16 MED ORDER — CHLORHEXIDINE GLUCONATE 4 % EX LIQD
60.0000 mL | Freq: Once | CUTANEOUS | Status: DC
  Filled 2017-09-16: qty 60

## 2017-09-16 MED ORDER — HYDRALAZINE HCL 20 MG/ML IJ SOLN
INTRAMUSCULAR | Status: AC
  Filled 2017-09-16: qty 1

## 2017-09-16 MED ORDER — SODIUM CHLORIDE 0.9 % IV SOLN
INTRAVENOUS | Status: AC
  Filled 2017-09-16: qty 2

## 2017-09-16 SURGICAL SUPPLY — 8 items
CABLE SURGICAL S-101-97-12 (CABLE) ×2 IMPLANT
IPG PACE AZUR XT DR MRI W1DR01 (Pacemaker) ×1 IMPLANT
LEAD CAPSURE NOVUS 5076-52CM (Lead) ×2 IMPLANT
LEAD CAPSURE NOVUS 5076-58CM (Lead) ×2 IMPLANT
PACE AZURE XT DR MRI W1DR01 (Pacemaker) ×2 IMPLANT
PAD PRO RADIOLUCENT 2001M-C (PAD) ×2 IMPLANT
SHEATH CLASSIC 7F (SHEATH) ×4 IMPLANT
TRAY PACEMAKER INSERTION (PACKS) ×2 IMPLANT

## 2017-09-16 NOTE — Progress Notes (Signed)
Late entry for 1932  Hypoglycemic Event  CBG: 57  Treatment: 15 GM carbohydrate snack  Symptoms: not thinking clearly  Follow-up CBG: Time: CBG Result:  Possible Reasons for Event: Inadequate meal intake  Comments/MD notified:Will notify MD    Derek Carr

## 2017-09-16 NOTE — Progress Notes (Signed)
PHARMACY NOTE:  ANTIMICROBIAL RENAL DOSAGE ADJUSTMENT  Current antimicrobial regimen includes a mismatch between antimicrobial dosage and estimated renal function.  As per policy approved by the Pharmacy & Therapeutics and Medical Executive Committees, the antimicrobial dosage will be adjusted accordingly.  Current antimicrobial dosage:  Cefazolin 1g q6h  Indication: post-operative   Renal Function: 48 mL/min    Antimicrobial dosage has been changed to:  Cefazolin 1g q8h  Thank you for allowing pharmacy to be a part of this patient's care.  Vertis Kelch, PharmD PGY1 Pharmacy Resident Phone [phone] 09/16/2017       6:34 PM

## 2017-09-16 NOTE — Progress Notes (Addendum)
Progress Note  Patient Name: Derek Carr Date of Encounter: 09/16/2017  Primary Cardiologist: Dr. Lovena Le  Subjective   No complaints, feels well.  Inpatient Medications    Scheduled Meds: . aspirin EC  81 mg Oral Daily  . calcium carbonate  1 tablet Oral Q breakfast  . cholecalciferol  1,000 Units Oral BID  . clobetasol cream  1 application Topical E1740  . docusate sodium  100 mg Oral BID  . enoxaparin (LOVENOX) injection  40 mg Subcutaneous Q24H  . folic acid  1 mg Oral Daily  . loratadine  10 mg Oral Daily  . omega-3 acid ethyl esters  1 g Oral QHS  . saccharomyces boulardii  250 mg Oral Daily  . valACYclovir  1,000 mg Oral Daily  . vitamin B-12  1,000 mcg Oral BID   Continuous Infusions:  PRN Meds:    Vital Signs    Vitals:   09/15/17 1622 09/15/17 2212 09/15/17 2305 09/16/17 0551  BP: 125/72 (!) 158/94 126/74 (!) 155/94  Pulse: 61 62 68 (!) 55  Resp: 17 18 18 18   Temp: 97.9 F (36.6 C) 97.8 F (36.6 C) 98.6 F (37 C) 97.8 F (36.6 C)  TempSrc: Oral Oral Oral Oral  SpO2: 100% 98% 100% 99%  Weight:    81.6 kg  Height:        Intake/Output Summary (Last 24 hours) at 09/16/2017 0711 Last data filed at 09/15/2017 1030 Gross per 24 hour  Intake 120 ml  Output -  Net 120 ml   Filed Weights   09/14/17 1220 09/15/17 0300 09/16/17 0551  Weight: 81.6 kg 81.1 kg 81.6 kg    Telemetry    SR/SB generally 50's, transient rates 40's, no high AVblock appreciated - Personally Reviewed  ECG    SB 52bpm, RBBB - Personally Reviewed  Physical Exam   GEN: No acute distress.   Neck: No JVD Cardiac: RRR, no murmurs, rubs, or gallops.  Respiratory: CTA b/l GI: Soft, nontender, non-distended  MS: No edema; No deformity. Neuro:  Nonfocal AAO x4 Psych: Normal affect   Labs    Chemistry Recent Labs  Lab 09/14/17 1252 09/15/17 0637  NA 141 141  K 3.9 3.7  CL 112* 111  CO2 23 24  GLUCOSE 99 81  BUN 17 14  CREATININE 1.65* 1.33*  CALCIUM 9.4 8.9    PROT  --  6.8  ALBUMIN  --  2.9*  AST  --  18  ALT  --  18  ALKPHOS  --  68  BILITOT  --  0.4  GFRNONAA 37* 48*  GFRAA 43* 55*  ANIONGAP 6 6     Hematology Recent Labs  Lab 09/14/17 1252 09/15/17 0637  WBC 3.5* 3.2*  RBC 4.38 4.11*  HGB 11.9* 11.4*  HCT 38.4* 35.7*  MCV 87.7 86.9  MCH 27.2 27.7  MCHC 31.0 31.9  RDW 15.6* 15.3  PLT 178 171    Cardiac Enzymes Recent Labs  Lab 09/14/17 2057 09/15/17 0140 09/15/17 0637  TROPONINI <0.03 <0.03 <0.03   No results for input(s): TROPIPOC in the last 168 hours.   BNPNo results for input(s): BNP, PROBNP in the last 168 hours.   DDimer No results for input(s): DDIMER in the last 168 hours.   Radiology    Ct Head Wo Contrast Result Date: 09/14/2017 CLINICAL DATA:  Near syncopal episode. Residual left-sided deficits from previous TIA. EXAM: CT HEAD WITHOUT CONTRAST TECHNIQUE: Contiguous axial images were obtained from the  base of the skull through the vertex without intravenous contrast. COMPARISON:  03/01/2017 FINDINGS: Brain: Ventricles, cisterns and other CSF spaces are within normal. There is no mass, mass effect, shift of midline structures or acute hemorrhage. No evidence of acute infarction. Mild basal ganglia calcifications are present. No acute infarction. Vascular: No hyperdense vessel or unexpected calcification. Skull: Normal. Negative for fracture or focal lesion. Sinuses/Orbits: Orbits are normal. Paranasal sinuses are clear. There is opacification over the inferior right mastoid air cells unchanged. Other: None. IMPRESSION: No acute findings. Opacification over the inferior right mastoid air cells unchanged. Electronically Signed   By: Marin Olp M.D.   On: 09/14/2017 16:11    Mr Brain Wo Contrast Result Date: 09/14/2017 CLINICAL DATA:  84 y/o  M; near syncopal event. EXAM: MRI HEAD WITHOUT CONTRAST TECHNIQUE: Multiplanar, multiecho pulse sequences of the brain and surrounding structures were obtained without  intravenous contrast. COMPARISON:  03/02/2017 MRI head.  09/14/2017 CT head. FINDINGS: Brain: No acute infarction, hemorrhage, hydrocephalus, extra-axial collection or mass lesion. Stable nonspecific T2 FLAIR hyperintensities in subcortical and periventricular white matter as well as the pons are compatible with mild chronic microvascular ischemic changes for age. Mild stable volume loss of the brain. Vascular: Normal flow voids. Skull and upper cervical spine: Normal marrow signal. Sinuses/Orbits: Mild paranasal sinus mucosal thickening. Right mastoid opacification. Bilateral intra-ocular lens replacement. Other: None. IMPRESSION: 1. No acute intracranial abnormality identified. 2. Stable mild chronic microvascular ischemic changes and volume loss of the brain for age. Electronically Signed   By: Kristine Garbe M.D.   On: 09/14/2017 20:49    Cardiac Studies   09/15/17 TTE Study Conclusions - Left ventricle: The cavity size was normal. Systolic function was   normal. The estimated ejection fraction was in the range of 55%   to 60%. Although no diagnostic regional wall motion abnormality   was identified, this possibility cannot be completely excluded on   the basis of this study. Doppler parameters are consistent with   abnormal left ventricular relaxation (grade 1 diastolic   dysfunction). - Aortic valve: There was mild regurgitation. - Left atrium: The atrium was mildly dilated. - Pericardium, extracardiac: A trivial pericardial effusion was   identified.  Patient Profile     84 y.o. male w/PMHx of HTN, HLD, prostate CA (s/p tx), stroke in Feb this year w/gait imbalance, mild dementia, OSA, CKD,  admitted to [facility] 09/14/17 observed to have some slowed sensorium and a neear syncopal episode.  He has been observed to be bradycardiac, EP was called to the case  Assessment & Plan    1. Weakness,  Near syncope     EEG normal     Hx of recurrent TIA's/stroke in Feb 2. Bradycardia       asymptomatic resting bradycardia      RN note yesterday with ambulation no HR increase, with reports of resting HR 60's and with ambulation, down to 40's-50's, no reported symptoms       Baseline RBBB/SB  Dr. Lovena Le has seen the patient this AM, reviewed record.  Discussed with RN from yesterday as well.  Pt ambulated well though had no HR response to ambulation, in fact noted transient slowing to 40's even.  Given chronotropic incompetence and weakness, near syncope, and today pt reports episodes of dizziness on/off, PPM is indicated.    I discussed PPM implant procedure with the patient, potential risks and  Benefits, he is willing to proceed.      For questions or  updates, please contact [place] Please consult [email or web address] for contact info under Cardiology/STEMI.      Signed, Baldwin [place], PA-C  09/16/2017, 7:11 AM    I have seen and examined this patient with Tommye Standard.  Agree with above, note added to reflect my findings.  On exam, RRR, no murmurs, lungs clear.  Patient had an episode of near syncope with chronotropic incompetence.  At this point pacemaker implant is indicated.  Risks and benefits were discussed and include bleeding, tamponade, infection, pneumothorax.  The patient understands the risks and is agreed to the procedure.  Coletta Lockner M. Errol Ala MD 09/16/2017 10:58 AM

## 2017-09-16 NOTE — H&P (Signed)
Derek Carr has presented today for surgery, with the diagnosis of chronotropic incompetence, symptomatic bradycardia.  The various methods of treatment have been discussed with the patient and family. After consideration of risks, benefits and other options for treatment, the patient has consented to  Procedure(s): Pacemaker implant as a surgical intervention .  Risks include but not limited to bleeding, tamponade, infection, pneumothorax, among others. The patient's history has been reviewed, patient examined, no change in status, stable for surgery.  I have reviewed the patient's chart and labs.  Questions were answered to the patient's satisfaction.    Derek Bernardi [name], MD 09/16/2017 10:59 AM

## 2017-09-16 NOTE — Plan of Care (Signed)
Pt resting comfortably in bed with daughter at bedside.

## 2017-09-16 NOTE — Progress Notes (Addendum)
Patient ID: Derek Carr, male   DOB: April 19, 1931, 84 y.o.   MRN: [id number]                                                                PROGRESS NOTE                                                                                                                                                                                                             Patient Demographics:    Derek Carr, is a 84 y.o. male, DOB - 1931/01/29, KVQ:[id number]  Admit date - 09/14/2017   Admitting Physician Jani Gravel, MD  Outpatient Primary MD for the patient is [name], MD  LOS - 0  Outpatient Specialists:     Chief Complaint  Patient presents with  . Near Syncope       Brief Narrative   84 y.o. male, w hypertension, hyperlipidemia, stroke w residual slow speech and gait imbalance , mild cognitive impairment, prostate cancer s/p XRT apparently had slow speech and his sensorium was off per daughter.  He was able to walk back to PT, and then apparently almost passed out.  His daughter noted that his arms were slightly tremulous.    In ED,  Hr 46 currently on heart monitor.  (sinus).    CT brain IMPRESSION: No acute findings.  Opacification over the inferior right mastoid air cells unchanged.  MRI brain IMPRESSION: 1. No acute intracranial abnormality identified. 2. Stable mild chronic microvascular ischemic changes and volume loss of the brain for age.  Na 141, K 3.9, Bun 17, Creatinine 1.65,   Wbc 3.5, Hgb 11.9, Plt 178  Urinalysis negative  EKG nsr at 52, LAD, nl pr interval, and qtc, RBBB,   Trop <0.03  Pt will be admitted observation for alerted mental status, slow speech, ? R/o seizure as well as bradycardia.       Subjective:    Derek Carr today has not had any further symptoms of slow speech, tremulousness.  Pt has been bradycardic, awaiting PM  No headache, No chest pain, No abdominal pain - No Nausea, No new weakness tingling or numbness, No Cough -  SOB.    Assessment  & Plan :    Principal Problem:   Near syncope Active Problems:  CKD (chronic kidney disease), stage III (HCC)   Near syncope, slow speech, tremulousness EEG 8/22=> negative MRI brain 8/21=> no acute process Cardiac echo 8/22=> EF 55%, mild AR No need for carotid ultrasound, due to prior ultrasound 03/02/2017 negative for stenosis  Bradycardia , TSH 8/22 normal Pacer placement required per cardiology Appreciate input  Renal insufficiency, stable Check cmp in am  Anemia Check cbc in am  H/o Stroke Check Lipid Cont Asprin 81mg  po qday  Hypertension Cont ARB   Pt due to requiring pacer will need inpatient admission.   Code Status :  FULL CODE  Family Communication  : w patient  Disposition Plan  : home  Barriers For Discharge :  Awaiting pacer placement  Consults  :  cardiology  Procedures  :    EEG 8/22=> negative MRI brain 8/21=> no acute process Cardiac echo 8/22=> EF 55%, mild AR  DVT Prophylaxis  :  Lovenox -- SCDs   Lab Results  Component Value Date   PLT 171 09/15/2017    Antibiotics  :  none  Anti-infectives (From admission, onward)   Start     Dose/Rate Route Frequency Ordered Stop   09/15/17 1000  valACYclovir (VALTREX) tablet 1,000 mg     1,000 mg Oral Daily 09/14/17 2125          Objective:   Vitals:   09/15/17 1622 09/15/17 2212 09/15/17 2305 09/16/17 0551  BP: 125/72 (!) 158/94 126/74 (!) 155/94  Pulse: 61 62 68 (!) 55  Resp: 17 18 18 18   Temp: 97.9 F (36.6 C) 97.8 F (36.6 C) 98.6 F (37 C) 97.8 F (36.6 C)  TempSrc: Oral Oral Oral Oral  SpO2: 100% 98% 100% 99%  Weight:    81.6 kg  Height:        Wt Readings from Last 3 Encounters:  09/16/17 81.6 kg  07/27/17 81.6 kg  07/07/17 81.6 kg     Intake/Output Summary (Last 24 hours) at 09/16/2017 0805 Last data filed at 09/15/2017 1030 Gross per 24 hour  Intake 120 ml  Output -  Net 120 ml     Physical Exam  Awake Alert, Oriented X 3, No  new F.N deficits, Normal affect [place].AT,PERRAL Supple Neck,No JVD, No cervical lymphadenopathy appriciated.  Symmetrical Chest wall movement, Good air movement bilaterally, CTAB Bradycardic, s1, s2,  1/6 sem rusb  +ve B.Sounds, Abd Soft, No tenderness, No organomegaly appriciated, No rebound - guarding or rigidity. No Cyanosis, Clubbing or edema, No new Rash or bruise      Data Review:    CBC Recent Labs  Lab 09/14/17 1252 09/15/17 0637  WBC 3.5* 3.2*  HGB 11.9* 11.4*  HCT 38.4* 35.7*  PLT 178 171  MCV 87.7 86.9  MCH 27.2 27.7  MCHC 31.0 31.9  RDW 15.6* 15.3    Chemistries  Recent Labs  Lab 09/14/17 1252 09/15/17 0637  NA 141 141  K 3.9 3.7  CL 112* 111  CO2 23 24  GLUCOSE 99 81  BUN 17 14  CREATININE 1.65* 1.33*  CALCIUM 9.4 8.9  AST  --  18  ALT  --  18  ALKPHOS  --  68  BILITOT  --  0.4   ------------------------------------------------------------------------------------------------------------------ No results for input(s): CHOL, HDL, LDLCALC, TRIG, CHOLHDL, LDLDIRECT in the last 72 hours.  Lab Results  Component Value Date   HGBA1C 6.1 (H) 03/02/2017   ------------------------------------------------------------------------------------------------------------------ Recent Labs    09/15/17 0638  TSH 1.767   ------------------------------------------------------------------------------------------------------------------ No  results for input(s): VITAMINB12, FOLATE, FERRITIN, TIBC, IRON, RETICCTPCT in the last 72 hours.  Coagulation profile No results for input(s): INR, PROTIME in the last 168 hours.  No results for input(s): DDIMER in the last 72 hours.  Cardiac Enzymes Recent Labs  Lab 09/14/17 2057 09/15/17 0140 09/15/17 0637  TROPONINI <0.03 <0.03 <0.03   ------------------------------------------------------------------------------------------------------------------ No results found for: BNP  Inpatient Medications  Scheduled  Meds: . aspirin EC  81 mg Oral Daily  . calcium carbonate  1 tablet Oral Q breakfast  . cholecalciferol  1,000 Units Oral BID  . clobetasol cream  1 application Topical [id number]  . docusate sodium  100 mg Oral BID  . enoxaparin (LOVENOX) injection  40 mg Subcutaneous Q24H  . folic acid  1 mg Oral Daily  . loratadine  10 mg Oral Daily  . omega-3 acid ethyl esters  1 g Oral QHS  . saccharomyces boulardii  250 mg Oral Daily  . valACYclovir  1,000 mg Oral Daily  . vitamin B-12  1,000 mcg Oral BID   Continuous Infusions: PRN Meds:.  Micro Results No results found for this or any previous visit (from the past 240 hour(s)).  Radiology Reports Ct Head Wo Contrast  Result Date: 09/14/2017 CLINICAL DATA:  Near syncopal episode. Residual left-sided deficits from previous TIA. EXAM: CT HEAD WITHOUT CONTRAST TECHNIQUE: Contiguous axial images were obtained from the base of the skull through the vertex without intravenous contrast. COMPARISON:  03/01/2017 FINDINGS: Brain: Ventricles, cisterns and other CSF spaces are within normal. There is no mass, mass effect, shift of midline structures or acute hemorrhage. No evidence of acute infarction. Mild basal ganglia calcifications are present. No acute infarction. Vascular: No hyperdense vessel or unexpected calcification. Skull: Normal. Negative for fracture or focal lesion. Sinuses/Orbits: Orbits are normal. Paranasal sinuses are clear. There is opacification over the inferior right mastoid air cells unchanged. Other: None. IMPRESSION: No acute findings. Opacification over the inferior right mastoid air cells unchanged. Electronically Signed   By: Marin Olp M.D.   On: 09/14/2017 16:11   Mr Brain Wo Contrast  Result Date: 09/14/2017 CLINICAL DATA:  84 y/o  M; near syncopal event. EXAM: MRI HEAD WITHOUT CONTRAST TECHNIQUE: Multiplanar, multiecho pulse sequences of the brain and surrounding structures were obtained without intravenous contrast. COMPARISON:   03/02/2017 MRI head.  09/14/2017 CT head. FINDINGS: Brain: No acute infarction, hemorrhage, hydrocephalus, extra-axial collection or mass lesion. Stable nonspecific T2 FLAIR hyperintensities in subcortical and periventricular white matter as well as the pons are compatible with mild chronic microvascular ischemic changes for age. Mild stable volume loss of the brain. Vascular: Normal flow voids. Skull and upper cervical spine: Normal marrow signal. Sinuses/Orbits: Mild paranasal sinus mucosal thickening. Right mastoid opacification. Bilateral intra-ocular lens replacement. Other: None. IMPRESSION: 1. No acute intracranial abnormality identified. 2. Stable mild chronic microvascular ischemic changes and volume loss of the brain for age. Electronically Signed   By: Kristine Garbe M.D.   On: 09/14/2017 20:49    Time Spent in minutes  30   Jani Gravel M.D on 09/16/2017 at 8:05 AM  Between 7am to 7pm - Pager - [phone]    After 7pm go to [email or web address] - password [facility]  [facility] -  Office  [phone]

## 2017-09-16 NOTE — Significant Event (Addendum)
Rapid Response Event Note  Overview: Multiple - Neuro Status Change, Hypertension, Chills/Flushed, Vagal ?   Initial Focused Assessment:  I was called at 1925 by the Charge RN about patient having slurred speech and Left Facial Droop. I was with another patient in a medical emergency, I asked the RNs to check a blood sugar andd obtain full of vitals. RN informed that [facility] on call provider was already called and that neuro checks were ordered. RNs were concerned, I did inform that since they are evaluating if they felt that Code Stroke needed to be called, they could initiate one and inform the provider.   I called back at [place] for an update, RNs informed that blood sugar was 57 at 1934 and it was being treated, BP was treated Hydralazine 10mg  IV at 1841 and SBP improved 130s, earlier had been as high as the 200s. When I was called at 2038, symptoms of slurred speech and facial droop had improved. I called at 2107, RN informed that patient was experiencing chills/being and was hot and flushed. I asked the staff to get a full set of VS and inform the oncall provider and see if they could come assess the patient. I was able to arrive 2125. When I arrived, patient was resting, quickly arouse, neuro intact, mild facial droop (baseline per daughter), speech was not slurred, moves all extremities (LUE - in sling s/p PPM, but able to grip - equally, intact sensation, + pulse, good capillary refill). Skin warm and dry, feet were slight cool to touch, but good pulses and good capillary refill. Patient denied pain, SOB, HA. Family noticed some twitching/quickering in the left side of patient's neck as well.   Interventions: - Blood Sugar Q4H - STAT CMP and CBC - K was 3.5 and Mag 1.6 ( I called 5W charge RN and asked that RN relay these values to [facility] NP on call) at 2330 - Orthostatic VS x 1  Plan of Care: - Will monitor as needed.  Event Summary:   at    Call Time [place] Time 2125 End Time  2215  Khalel Alms R

## 2017-09-17 ENCOUNTER — Inpatient Hospital Stay (HOSPITAL_COMMUNITY): Payer: Medicare HMO

## 2017-09-17 LAB — COMPREHENSIVE METABOLIC PANEL
ALBUMIN: 2.9 g/dL — AB (ref 3.5–5.0)
ALT: 15 U/L (ref 0–44)
ANION GAP: 11 (ref 5–15)
AST: 18 U/L (ref 15–41)
Alkaline Phosphatase: 67 U/L (ref 38–126)
BUN: 16 mg/dL (ref 8–23)
CO2: 19 mmol/L — AB (ref 22–32)
Calcium: 9.3 mg/dL (ref 8.9–10.3)
Chloride: 109 mmol/L (ref 98–111)
Creatinine, Ser: 1.24 mg/dL (ref 0.61–1.24)
GFR calc Af Amer: >60 mL/min (ref >60)
GFR calc non Af Amer: 52 mL/min — ABNORMAL LOW (ref >60)
Glucose, Bld: 88 mg/dL (ref 70–99)
POTASSIUM: 3.6 mmol/L (ref 3.5–5.1)
SODIUM: 139 mmol/L (ref 135–145)
Total Bilirubin: 0.7 mg/dL (ref 0.3–1.2)
Total Protein: 6.9 g/dL (ref 6.5–8.1)

## 2017-09-17 LAB — LIPID PANEL
CHOL/HDL RATIO: 6 ratio
Cholesterol: 185 mg/dL (ref 0–200)
HDL: 31 mg/dL — AB (ref >40)
LDL Cholesterol: 140 mg/dL — ABNORMAL HIGH (ref 0–99)
TRIGLYCERIDES: 72 mg/dL (ref <150)
VLDL: 14 mg/dL (ref 0–40)

## 2017-09-17 LAB — CBC
HCT: 36.1 % — ABNORMAL LOW (ref 39.0–52.0)
HEMOGLOBIN: 11.7 g/dL — AB (ref 13.0–17.0)
MCH: 27.5 pg (ref 26.0–34.0)
MCHC: 32.4 g/dL (ref 30.0–36.0)
MCV: 84.7 fL (ref 78.0–100.0)
Platelets: 162 10*3/uL (ref 150–400)
RBC: 4.26 MIL/uL (ref 4.22–5.81)
RDW: 15 % (ref 11.5–15.5)
WBC: 7.1 10*3/uL (ref 4.0–10.5)

## 2017-09-17 LAB — GLUCOSE, CAPILLARY: GLUCOSE-CAPILLARY: 127 mg/dL — AB (ref 70–99)

## 2017-09-17 MED ORDER — ACETAMINOPHEN 325 MG PO TABS
650.0000 mg | ORAL_TABLET | Freq: Four times a day (QID) | ORAL | Status: DC | PRN
  Administered 2017-09-17 (×2): 650 mg via ORAL
  Filled 2017-09-17 (×2): qty 2

## 2017-09-17 NOTE — Plan of Care (Signed)
Pt resting in bed comfortably in no apparent distress 

## 2017-09-17 NOTE — Progress Notes (Signed)
Nsg Discharge Note  Admit Date:  09/14/2017 Discharge date: 09/17/2017   GAVYN YBARRA to be D/C'd Home per MD order.  AVS completed.  Copy for chart, and copy for patient signed, and dated. Patient/caregiver able to verbalize understanding.  Discharge Medication: Allergies as of 09/17/2017      Reactions   Oxycodone Other (See Comments)   Dilaudid [hydromorphone Hcl] Other (See Comments)   Confusion   Methocarbamol Other (See Comments)   Drowsiness   Percodan [oxycodone-aspirin] Other (See Comments)   Confusion   Tramadol Other (See Comments)   Drowsiness   Vicodin [hydrocodone-acetaminophen] Other (See Comments)   Confusion      Medication List    TAKE these medications   acetaminophen 500 MG tablet Commonly known as:  TYLENOL Take 1,000 mg by mouth 2 (two) times daily.   aspirin EC 81 MG tablet Take 81 mg by mouth daily.   cetirizine 10 MG tablet Commonly known as:  ZYRTEC Take 10 mg by mouth daily.   clobetasol cream 0.05 % Commonly known as:  TEMOVATE Apply 1 application topically daily at 12 noon.   DHA OMEGA 3 PO Take 1,000 mg by mouth daily.   docusate sodium 100 MG capsule Commonly known as:  COLACE Take 1 capsule (100 mg total) by mouth 2 (two) times daily. While taking narcotic pain medicine. Notes to patient:  Take 1 capsule (100mg  total) by mouth 2 (two) times daily. While taking narcotic pain medicine.   folic acid 1 MG tablet Commonly known as:  FOLVITE Take 1 mg by mouth daily.   irbesartan 75 MG tablet Commonly known as:  AVAPRO Take 37.5 mg by mouth daily. **REPLACES VALSARTAN**   OMEGA DHA PO Take 1 tablet by mouth at bedtime.   PROBIOTIC PO Take 1 capsule by mouth daily.   QUNOL COQ10/UBIQUINOL/MEGA 100 MG Caps Generic drug:  Ubiquinol Take 1 tablet by mouth at bedtime.   senna 8.6 MG Tabs tablet Commonly known as:  SENOKOT Take 2 tablets (17.2 mg total) by mouth 2 (two) times daily.   TUMS PO Take 1 tablet by mouth daily.    valACYclovir 1000 MG tablet Commonly known as:  VALTREX Take 1,000 mg by mouth daily.   Vitamin B12 1000 MCG Tbcr Take 1 tablet by mouth 2 (two) times daily.   Vitamin D-3 1000 units Caps Take 1 capsule by mouth 2 (two) times daily.       Discharge Assessment: Vitals:   09/17/17 0555 09/17/17 1131  BP: 129/78   Pulse: 76   Resp:  20  Temp:  97.8 F (36.6 C)  SpO2:  98%   Skin clean, dry and intact without evidence of skin break down, no evidence of skin tears noted. IV catheter discontinued intact. Site without signs and symptoms of complications - no redness or edema noted at insertion site, patient denies c/o pain - only slight tenderness at site.  Dressing with slight pressure applied.  D/c Instructions-Education: Discharge instructions given to patient/family with verbalized understanding. D/c education completed with patient/family including follow up instructions, medication list, d/c activities limitations if indicated, with other d/c instructions as indicated by MD - patient able to verbalize understanding, all questions fully answered. Patient instructed to return to ED, call 911, or call MD for any changes in condition.  Patient escorted via [place], and D/C home via private auto.  Cristela Blue, RN 09/17/2017 7:01 PM

## 2017-09-17 NOTE — Progress Notes (Signed)
Progress Note  Patient Name: Derek Carr Date of Encounter: 09/17/2017  Primary Cardiologist: No primary care provider on file.   Subjective   No chest pain or sob. Minimal incisional pain.   Inpatient Medications    Scheduled Meds: . amLODipine  2.5 mg Oral Daily  . aspirin EC  81 mg Oral Daily  . calcium carbonate  1 tablet Oral Q breakfast  . cholecalciferol  1,000 Units Oral BID  . clobetasol cream  1 application Topical [id number]  . docusate sodium  100 mg Oral BID  . enoxaparin (LOVENOX) injection  40 mg Subcutaneous Q24H  . folic acid  1 mg Oral Daily  . loratadine  10 mg Oral Daily  . omega-3 acid ethyl esters  1 g Oral QHS  . saccharomyces boulardii  250 mg Oral Daily  . valACYclovir  1,000 mg Oral Daily  . vitamin B-12  1,000 mcg Oral BID   Continuous Infusions: . sodium chloride 75 mL/hr at 09/16/17 1904  .  ceFAZolin (ANCEF) IV 1 g (09/17/17 0610)   PRN Meds: acetaminophen, hydrALAZINE, ondansetron (ZOFRAN) IV   Vital Signs    Vitals:   09/16/17 2041 09/17/17 0449 09/17/17 0547 09/17/17 0555  BP: (!) 139/91 (!) 159/105 140/77 129/78  Pulse: 80 81 74 76  Resp: 19     Temp: (!) 97.3 F (36.3 C) 98.1 F (36.7 C)    TempSrc: Oral Oral    SpO2: 100% 100%    Weight:      Height:        Intake/Output Summary (Last 24 hours) at 09/17/2017 0903 Last data filed at 09/17/2017 0500 Gross per 24 hour  Intake 598.25 ml  Output 850 ml  Net -251.75 ml   Filed Weights   09/14/17 1220 09/15/17 0300 09/16/17 0551  Weight: 81.6 kg 81.1 kg 81.6 kg    Telemetry    nsr with atrial pacing - Personally Reviewed  ECG    none - Personally Reviewed  Physical Exam   GEN: No acute distress.   Neck: No JVD Cardiac: RRR, no murmurs, rubs, or gallops.  Respiratory: Clear to auscultation bilaterally. No hematoma GI: Soft, nontender, non-distended  MS: No edema; No deformity. Neuro:  Nonfocal  Psych: Normal affect   Labs    Chemistry Recent Labs  Lab  09/15/17 0637 09/16/17 2126 09/17/17 0506  NA 141 140 139  K 3.7 3.5 3.6  CL 111 111 109  CO2 24 19* 19*  GLUCOSE 81 146* 88  BUN 14 17 16   CREATININE 1.33* 1.32* 1.24  CALCIUM 8.9 9.2 9.3  PROT 6.8 6.7 6.9  ALBUMIN 2.9* 3.1* 2.9*  AST 18 19 18   ALT 18 17 15   ALKPHOS 68 70 67  BILITOT 0.4 0.7 0.7  GFRNONAA 48* 48* 52*  GFRAA 55* 56* >60  ANIONGAP 6 10 11      Hematology Recent Labs  Lab 09/15/17 0637 09/16/17 2126 09/17/17 0506  WBC 3.2* 7.6 7.1  RBC 4.11* 4.27 4.26  HGB 11.4* 11.8* 11.7*  HCT 35.7* 37.0* 36.1*  MCV 86.9 86.7 84.7  MCH 27.7 27.6 27.5  MCHC 31.9 31.9 32.4  RDW 15.3 15.5 15.0  PLT 171 172 162    Cardiac Enzymes Recent Labs  Lab 09/14/17 2057 09/15/17 0140 09/15/17 0637  TROPONINI <0.03 <0.03 <0.03   No results for input(s): TROPIPOC in the last 168 hours.   BNPNo results for input(s): BNP, PROBNP in the last 168 hours.   DDimer No  results for input(s): DDIMER in the last 168 hours.   Radiology    No results found.  Cardiac Studies   none  Patient Profile     84 y.o. male admitted with near syncope and bradycardia and found to have chronotropic incompetence. He is s/p PPM  Assessment & Plan    1. PPM - he is s/p PPM and is doing well. He may be discharged home from cardiology perspective. Follow up has been arranged. CXR is pending. Device interogation under my direction demonstrates normal device function.  [facility] will sign off.   Medication Recommendations:  See above Other recommendations (labs, testing, etc):  none Follow up as an outpatient:  As scheduled in device clinic in 7-10 days.   For questions or updates, please contact [place] Please consult [email or web address] for contact info under Cardiology/STEMI.      Signed, Cristopher Peru, MD  09/17/2017, 9:03 AM  Patient ID: Derek Carr, male   DOB: 06/14/1931, 84 y.o.   MRN: [id number]

## 2017-09-17 NOTE — Discharge Instructions (Signed)
Near-Syncope Near-syncope is when you suddenly become weak or dizzy, or you feel like you might pass out (faint). During an episode of near-syncope, you may:  Feel dizzy or light-headed.  Feel nauseous.  See all white or all black in your field of vision.  Have cold, clammy skin.  This condition is caused by a sudden decrease in blood flow to the brain. This decrease can result from various causes, but most of those causes are not dangerous. However, near-syncope can be a sign of a serious medical problem, so it is important to seek medical care. If you fainted, get medical help right away.Call your local emergency services (911 in the [place] Do not drive yourself to the hospital. Follow these instructions at home: Pay attention to any changes in your symptoms. Take these actions to help with your condition:  Have someone stay with you until you feel stable.  Do not drive, use machinery, or play sports until your health care provider says it is okay.  Keep all follow-up visits as told by your health care provider. This is important.  If you start to feel like you might faint, lie down right away and raise (elevate) your feet above the level of your heart. Breathe deeply and steadily. Wait until all of the symptoms have passed.  Drink enough fluid to keep your urine clear or pale yellow.  If you are taking blood pressure or heart medicine, get up slowly and take several minutes to sit and then stand. This can reduce dizziness.  Take over-the-counter and prescription medicines only as told by your health care provider.  Get help right away if:  You have a severe headache.  You have unusual pain in your chest, abdomen, or back.  You are bleeding from your mouth or rectum, or you have black or tarry stool.  You have a very fast or irregular heartbeat (palpitations).  You faint once or repeatedly.  You have a seizure.  You are confused.  You have trouble walking.  You  have severe weakness.  You have vision problems. These symptoms may represent a serious problem that is an emergency. Do not wait to see if your symptoms will go away. Get medical help right away. Call your local emergency services (911 in the [place] Do not drive yourself to the hospital. This information is not intended to replace advice given to you by your health care provider. Make sure you discuss any questions you have with your health care provider. Document Released: 01/11/2005 Document Revised: 06/19/2015 Document Reviewed: 09/25/2014 Elsevier Interactive Patient Education  2017 [place] Discharge Instructions for  Pacemaker/Defibrillator Patients  Activity No heavy lifting or vigorous activity with your left/right arm for 6 to 8 weeks.  Do not raise your left/right arm above your head for one week.  Gradually raise your affected arm as drawn below.             09/20/17                     09/21/17                    09/22/17                  09/23/17  __  NO DRIVING until cleared to at your wound check visit  [place] the wound area clean and dry.  Do not get this area wet, no showers  until cleared to at your wound check visit. - The tape/steri-strips on your wound will fall off; do not pull them off.  No bandage is needed on the site.  DO  NOT apply any creams, oils, or ointments to the wound area. - If you notice any drainage or discharge from the wound, any swelling or bruising at the site, or you develop a fever > 101? F after you are discharged home, call the office at once.  Special Instructions - You are still able to use cellular telephones; use the ear opposite the side where you have your pacemaker/defibrillator.  Avoid carrying your cellular phone near your device. - When traveling through airports, show security personnel your identification card to avoid being screened in the metal detectors.  Ask the security personnel to use the hand  wand. - Avoid arc welding equipment, MRI testing (magnetic resonance imaging), TENS units (transcutaneous nerve stimulators).  Call the office for questions about other devices. - Avoid electrical appliances that are in poor condition or are not properly grounded. - Microwave ovens are safe to be near or to operate.  Additional information for defibrillator patients should your device go off: - If your device goes off ONCE and you feel fine afterward, notify the device clinic nurses. - If your device goes off ONCE and you do not feel well afterward, call 911. - If your device goes off TWICE, call 911. - If your device goes off THREE times in one day, call 911.  DO NOT DRIVE YOURSELF OR A FAMILY MEMBER WITH A DEFIBRILLATOR TO THE HOSPITAL--CALL 911.

## 2017-09-17 NOTE — Discharge Summary (Signed)
Physician Discharge Summary  Derek Carr SEG:[id number] DOB: [birth year] DOA: 09/14/2017  PCP: [name], MD  Admit date: 09/14/2017 Discharge date: 09/17/2017  Admitted From: Home Disposition: Home  Recommendations for Outpatient Follow-up:  1. Follow up with PCP in 1-2 weeks 2. Please obtain BMP/CBC in one week your next doctors visit.  3. Follow-up outpatient with cardiology, arrangements has been made by their team.  Home Health: None Equipment/Devices: Now has pacemaker in place Discharge Condition: Stable CODE STATUS: Full code Diet recommendation: Cardiac  Brief/Interim Summary: 84 year old with a history of essential hypertension, hyperlipidemia, stroke with residual slow speech and gait imbalance, mild cognitive impairment, prostate cancer status post radiation  Was admitted to the hospital for symptomatic bradycardia requiring pacemaker implant.  Patient tolerated the procedure well without any issues.  On the evening of the procedure patient became hypotensive and hypoglycemic.  At that time family noticed he was having some facial droop and slurred speech therefore rapid response was called.  At that time he was noted to have hypoglycemia which was corrected.  Initially his blood pressure systolic was in 737 therefore prior to this he was given 10 mg of IV hydralazine.  After correction of his blood glucose and improvement in his blood pressure his symptoms resolved. Patient rested well overnight and no complaints the following morning.  I suspect his symptoms could have been possibly secondary to combination of hypoglycemia and sudden drop in blood pressure.  I have also advised family members to keep a close eye out on this, patient is not diabetic otherwise at home.  He likely became slightly hypoglycemic because he did not have much to eat all day due to the procedure. At this time patient has reached maximum benefit from hospital stay and stable to discharge with outpatient  follow-up recommendations as stated.    Discharge Diagnoses:  Principal Problem:   Near syncope Active Problems:   CKD (chronic kidney disease), stage III (HCC)   Bradycardia  Symptomatic bradycardia - TSH is within normal limits, EEG was negative.  MRI of the brain was negative.  Echocardiogram couple of days ago showed ejection fraction 55% with mild aortic regurgitation.  Prior ultrasound earlier this year of his carotid arteries was negative for any stenosis.  He underwent pacemaker placement which he tolerated well.  Currently he is doing better he needs outpatient cardiology follow-up which has been arranged by their team.  Slurred speech and facial droop, resolved Previous history of CVA -Suspect secondary to symptomatic hypoglycemia and sudden drop in blood pressure.  Currently he is back to baseline.  I have advised family to avoid these things and also check blood pressure routinely along with blood sugars at home.  Follow-up with outpatient primary care physician about this. -Continue home medications aspirin  CKD stage II-3 -Currently stable.  No complaints.  Essential hypertension -Continue home ARB.  No further changes.  Patient is a full code per family Both daughters at bedside this morning and all the questions answered.   Discharge Instructions   Allergies as of 09/17/2017      Reactions   Oxycodone Other (See Comments)   Dilaudid [hydromorphone Hcl] Other (See Comments)   Confusion   Methocarbamol Other (See Comments)   Drowsiness   Percodan [oxycodone-aspirin] Other (See Comments)   Confusion   Tramadol Other (See Comments)   Drowsiness   Vicodin [hydrocodone-acetaminophen] Other (See Comments)   Confusion      Medication List    TAKE these medications  acetaminophen 500 MG tablet Commonly known as:  TYLENOL Take 1,000 mg by mouth 2 (two) times daily.   aspirin EC 81 MG tablet Take 81 mg by mouth daily.   cetirizine 10 MG tablet Commonly  known as:  ZYRTEC Take 10 mg by mouth daily.   clobetasol cream 0.05 % Commonly known as:  TEMOVATE Apply 1 application topically daily at 12 noon.   DHA OMEGA 3 PO Take 1,000 mg by mouth daily.   docusate sodium 100 MG capsule Commonly known as:  COLACE Take 1 capsule (100 mg total) by mouth 2 (two) times daily. While taking narcotic pain medicine.   folic acid 1 MG tablet Commonly known as:  FOLVITE Take 1 mg by mouth daily.   irbesartan 75 MG tablet Commonly known as:  AVAPRO Take 37.5 mg by mouth daily. **REPLACES VALSARTAN**   OMEGA DHA PO Take 1 tablet by mouth at bedtime.   PROBIOTIC PO Take 1 capsule by mouth daily.   QUNOL COQ10/UBIQUINOL/MEGA 100 MG Caps Generic drug:  Ubiquinol Take 1 tablet by mouth at bedtime.   senna 8.6 MG Tabs tablet Commonly known as:  SENOKOT Take 2 tablets (17.2 mg total) by mouth 2 (two) times daily.   TUMS PO Take 1 tablet by mouth daily.   valACYclovir 1000 MG tablet Commonly known as:  VALTREX Take 1,000 mg by mouth daily.   Vitamin B12 1000 MCG Tbcr Take 1 tablet by mouth 2 (two) times daily.   Vitamin D-3 1000 units Caps Take 1 capsule by mouth 2 (two) times daily.      Follow-up Information    [facility] Follow up on 09/28/2017.   Specialty:  Cardiology Why:  4:30PM, wound check visit Contact information: [address] [place]       Evans Lance, MD Follow up on 12/21/2017.   Specialty:  Cardiology Why:  10:45AM Contact information: [address] [place] [address] [phone]        [name], MD. Schedule an appointment as soon as possible for a visit in 1 week(s).   Specialty:  Internal Medicine Contact information: [place] [address] [phone]          Allergies  Allergen Reactions  . Oxycodone Other (See Comments)  . Dilaudid [Hydromorphone Hcl] Other (See  Comments)    Confusion   . Methocarbamol Other (See Comments)    Drowsiness   . Percodan [Oxycodone-Aspirin] Other (See Comments)    Confusion   . Tramadol Other (See Comments)    Drowsiness   . Vicodin [Hydrocodone-Acetaminophen] Other (See Comments)    Confusion     You were cared for by a hospitalist during your hospital stay. If you have any questions about your discharge medications or the care you received while you were in the hospital after you are discharged, you can call the unit and asked to speak with the hospitalist on call if the hospitalist that took care of you is not available. Once you are discharged, your primary care physician will handle any further medical issues. Please note that no refills for any discharge medications will be authorized once you are discharged, as it is imperative that you return to your primary care physician (or establish a relationship with a primary care physician if you do not have one) for your aftercare needs so that they can reassess your need for medications and monitor your lab  values.  Consultations:  Cardiology   Procedures/Studies: Ct Head Wo Contrast  Result Date: 09/14/2017 CLINICAL DATA:  Near syncopal episode. Residual left-sided deficits from previous TIA. EXAM: CT HEAD WITHOUT CONTRAST TECHNIQUE: Contiguous axial images were obtained from the base of the skull through the vertex without intravenous contrast. COMPARISON:  03/01/2017 FINDINGS: Brain: Ventricles, cisterns and other CSF spaces are within normal. There is no mass, mass effect, shift of midline structures or acute hemorrhage. No evidence of acute infarction. Mild basal ganglia calcifications are present. No acute infarction. Vascular: No hyperdense vessel or unexpected calcification. Skull: Normal. Negative for fracture or focal lesion. Sinuses/Orbits: Orbits are normal. Paranasal sinuses are clear. There is opacification over the inferior right mastoid air cells  unchanged. Other: None. IMPRESSION: No acute findings. Opacification over the inferior right mastoid air cells unchanged. Electronically Signed   By: Marin Olp M.D.   On: 09/14/2017 16:11   Mr Brain Wo Contrast  Result Date: 09/14/2017 CLINICAL DATA:  84 y/o  M; near syncopal event. EXAM: MRI HEAD WITHOUT CONTRAST TECHNIQUE: Multiplanar, multiecho pulse sequences of the brain and surrounding structures were obtained without intravenous contrast. COMPARISON:  03/02/2017 MRI head.  09/14/2017 CT head. FINDINGS: Brain: No acute infarction, hemorrhage, hydrocephalus, extra-axial collection or mass lesion. Stable nonspecific T2 FLAIR hyperintensities in subcortical and periventricular white matter as well as the pons are compatible with mild chronic microvascular ischemic changes for age. Mild stable volume loss of the brain. Vascular: Normal flow voids. Skull and upper cervical spine: Normal marrow signal. Sinuses/Orbits: Mild paranasal sinus mucosal thickening. Right mastoid opacification. Bilateral intra-ocular lens replacement. Other: None. IMPRESSION: 1. No acute intracranial abnormality identified. 2. Stable mild chronic microvascular ischemic changes and volume loss of the brain for age. Electronically Signed   By: Kristine Garbe M.D.   On: 09/14/2017 20:49      Subjective: No complaints, he is doing better.  Had episode of facial droop and slurred speech yesterday for which rapid response was called.  He was noted to be hypoglycemic and had sudden drop in blood pressure after getting IV hydralazine.  With resolution of hypoglycemia and improvement his blood pressure his symptoms resolved.  General = no fevers, chills, dizziness, malaise, fatigue HEENT/EYES = negative for pain, redness, loss of vision, double vision, blurred vision, loss of hearing, sore throat, hoarseness, dysphagia Cardiovascular= negative for chest pain, palpitation, murmurs, lower extremity  swelling Respiratory/lungs= negative for shortness of breath, cough, hemoptysis, wheezing, mucus production Gastrointestinal= negative for nausea, vomiting,, abdominal pain, melena, hematemesis Genitourinary= negative for Dysuria, Hematuria, Change in Urinary Frequency MSK = Negative for arthralgia, myalgias, Back Pain, Joint swelling  Neurology= Negative for headache, seizures, numbness, tingling  Psychiatry= Negative for anxiety, depression, suicidal and homocidal ideation Allergy/Immunology= Medication/Food allergy as listed  Skin= Negative for Rash, lesions, ulcers, itching   Discharge Exam: Vitals:   09/17/17 0547 09/17/17 0555  BP: 140/77 129/78  Pulse: 74 76  Resp:    Temp:    SpO2:     Vitals:   09/16/17 2041 09/17/17 0449 09/17/17 0547 09/17/17 0555  BP: (!) 139/91 (!) 159/105 140/77 129/78  Pulse: 80 81 74 76  Resp: 19     Temp: (!) 97.3 F (36.3 C) 98.1 F (36.7 C)    TempSrc: Oral Oral    SpO2: 100% 100%    Weight:      Height:        General: Pt is alert, awake, not in acute distress Cardiovascular: RRR, S1/S2 +,  no rubs, no gallops Respiratory: CTA bilaterally, no wheezing, no rhonchi Abdominal: Soft, NT, ND, bowel sounds + Extremities: no edema, no cyanosis Left chest wall pacemaker site noted which appears to be clean without any signs of infection or bleeding   The results of significant diagnostics from this hospitalization (including imaging, microbiology, ancillary and laboratory) are listed below for reference.     Microbiology: Recent Results (from the past 240 hour(s))  Surgical pcr screen     Status: None   Collection Time: 09/16/17 10:46 AM  Result Value Ref Range Status   MRSA, PCR NEGATIVE NEGATIVE Final   Staphylococcus aureus NEGATIVE NEGATIVE Final    Comment: (NOTE) The Xpert SA Assay (FDA approved for NASAL specimens in patients 99 years of age and older), is one component of a comprehensive surveillance program. It is not  intended to diagnose infection nor to guide or monitor treatment. Performed at [facility], [place] [address] [place], [place] [address]      Labs: BNP (last 3 results) No results for input(s): BNP in the last 8760 hours. Basic Metabolic Panel: Recent Labs  Lab 09/14/17 1252 09/15/17 0637 09/16/17 2126 09/17/17 0506  NA 141 141 140 139  K 3.9 3.7 3.5 3.6  CL 112* 111 111 109  CO2 23 24 19* 19*  GLUCOSE 99 81 146* 88  BUN 17 14 17 16   CREATININE 1.65* 1.33* 1.32* 1.24  CALCIUM 9.4 8.9 9.2 9.3  MG  --   --  1.6*  --    Liver Function Tests: Recent Labs  Lab 09/15/17 0637 09/16/17 2126 09/17/17 0506  AST 18 19 18   ALT 18 17 15   ALKPHOS 68 70 67  BILITOT 0.4 0.7 0.7  PROT 6.8 6.7 6.9  ALBUMIN 2.9* 3.1* 2.9*   No results for input(s): LIPASE, AMYLASE in the last 168 hours. No results for input(s): AMMONIA in the last 168 hours. CBC: Recent Labs  Lab 09/14/17 1252 09/15/17 0637 09/16/17 2126 09/17/17 0506  WBC 3.5* 3.2* 7.6 7.1  HGB 11.9* 11.4* 11.8* 11.7*  HCT 38.4* 35.7* 37.0* 36.1*  MCV 87.7 86.9 86.7 84.7  PLT 178 171 172 162   Cardiac Enzymes: Recent Labs  Lab 09/14/17 2057 09/15/17 0140 09/15/17 0637  TROPONINI <0.03 <0.03 <0.03   BNP: Invalid input(s): POCBNP CBG: Recent Labs  Lab 09/14/17 1926 09/16/17 1934 09/16/17 2004 09/16/17 2049 09/17/17 0018  GLUCAP 100* 57* 65* 110* 127*   D-Dimer No results for input(s): DDIMER in the last 72 hours. Hgb A1c No results for input(s): HGBA1C in the last 72 hours. Lipid Profile Recent Labs    09/17/17 0506  CHOL 185  HDL 31*  LDLCALC 140*  TRIG 72  CHOLHDL 6.0   Thyroid function studies Recent Labs    09/15/17 0638  TSH 1.767   Anemia work up No results for input(s): VITAMINB12, FOLATE, FERRITIN, TIBC, IRON, RETICCTPCT in the last 72 hours. Urinalysis    Component Value Date/Time   COLORURINE YELLOW 09/14/2017 1540   APPEARANCEUR CLEAR 09/14/2017 1540   LABSPEC 1.019  09/14/2017 1540   PHURINE 5.0 09/14/2017 1540   GLUCOSEU NEGATIVE 09/14/2017 1540   HGBUR NEGATIVE 09/14/2017 1540   BILIRUBINUR NEGATIVE 09/14/2017 1540   KETONESUR NEGATIVE 09/14/2017 1540   PROTEINUR NEGATIVE 09/14/2017 1540   UROBILINOGEN 0.2 05/14/2013 1400   NITRITE NEGATIVE 09/14/2017 1540   LEUKOCYTESUR NEGATIVE 09/14/2017 1540   Sepsis Labs Invalid input(s): PROCALCITONIN,  WBC,  LACTICIDVEN Microbiology Recent Results (from  the past 240 hour(s))  Surgical pcr screen     Status: None   Collection Time: 09/16/17 10:46 AM  Result Value Ref Range Status   MRSA, PCR NEGATIVE NEGATIVE Final   Staphylococcus aureus NEGATIVE NEGATIVE Final    Comment: (NOTE) The Xpert SA Assay (FDA approved for NASAL specimens in patients 16 years of age and older), is one component of a comprehensive surveillance program. It is not intended to diagnose infection nor to guide or monitor treatment. Performed at [facility], [place] [address], [place], [place] [address]      Time coordinating discharge:  I have spent 35 minutes face to face with the patient and on the ward discussing the patients care, assessment, plan and disposition with other care givers. >50% of the time was devoted counseling the patient about the risks and benefits of treatment/Discharge disposition and coordinating care.   SIGNED:   Damita Lack, MD  [facility] 09/17/2017, 10:27 AM Pager   If 7PM-7AM, please contact night-coverage [email or web address] Password [email or web address]

## 2017-09-19 ENCOUNTER — Encounter (HOSPITAL_COMMUNITY): Payer: Self-pay | Admitting: Cardiology

## 2017-09-19 ENCOUNTER — Ambulatory Visit: Payer: Medicare HMO | Admitting: Physical Therapy

## 2017-09-21 ENCOUNTER — Ambulatory Visit: Payer: Medicare HMO | Admitting: Physical Therapy

## 2017-09-23 ENCOUNTER — Ambulatory Visit (INDEPENDENT_AMBULATORY_CARE_PROVIDER_SITE_OTHER): Payer: Medicare HMO | Admitting: Neurology

## 2017-09-23 DIAGNOSIS — G4710 Hypersomnia, unspecified: Secondary | ICD-10-CM

## 2017-09-23 DIAGNOSIS — G4731 Primary central sleep apnea: Secondary | ICD-10-CM

## 2017-09-23 DIAGNOSIS — I69398 Other sequelae of cerebral infarction: Secondary | ICD-10-CM

## 2017-09-23 DIAGNOSIS — G4719 Other hypersomnia: Secondary | ICD-10-CM

## 2017-09-23 DIAGNOSIS — J438 Other emphysema: Secondary | ICD-10-CM

## 2017-09-23 DIAGNOSIS — R351 Nocturia: Secondary | ICD-10-CM

## 2017-09-23 DIAGNOSIS — G4737 Central sleep apnea in conditions classified elsewhere: Secondary | ICD-10-CM

## 2017-09-23 DIAGNOSIS — F0150 Vascular dementia without behavioral disturbance: Secondary | ICD-10-CM

## 2017-09-26 NOTE — Addendum Note (Signed)
Addended by: [name] on: 09/26/2017 01:45 PM   Modules accepted: Orders

## 2017-09-26 NOTE — Procedures (Signed)
PATIENT'S NAME:  Derek Carr, Derek Carr DOB:      [birth year]      MR#:    [id number]     DATE OF RECORDING: 09/23/2017 REFERRING M.D.:  Antony Contras, MD- Dory Peru, NP  Study Performed:   Titration to Continuous Positive Airway Pressure HISTORY:  Derek Carr returned for CPAP titration study after a 08-24-2017 HST on watch pat showed an AHI of 37.9/h , mostly of CSA, central AHI of 28.5, Supine AHI was 53.4/h, without hypoxemia- SpO2 nadir of 91%. He had not been able to use his CPAP in 2018, had suffered a stroke during the time and a pacemaker was implanted.   The patient endorsed the Epworth Sleepiness Scale at 20/24 points, and the Fatigue Score at 47/63 points.   The patient's weight 180 pounds with a height of 75 (inches), resulting in a BMI of 22.5 kg/m2. The patient's neck circumference measured 17 inches.  CURRENT MEDICATIONS: Tylenol, Aspirin, Tums, Zyrtec, Vitamin D, Temovate, Vitamin B, Omega 3, Colace, Folvite, Avapro, Omega, Probiotic, Senokot, CO Q10, Valtrex.  PROCEDURE:  This is a multichannel digital polysomnogram utilizing the SomnoStar 11.2 system.  Electrodes and sensors were applied and monitored per AASM Specifications.   EEG, EOG, Chin and Limb EMG, were sampled at 200 Hz.  ECG, Snore and Nasal Pressure, Thermal Airflow, Respiratory Effort, CPAP Flow and Pressure, Oximetry was sampled at 50 Hz. Digital video and audio were recorded.      CPAP was initiated at 5 cmH20 with heated humidity per AASM split night standards and pressure was advanced to 13 cmH20 because of hypopneas, apneas and desaturations.  At a PAP pressure of 13 cmH20, there was a reduction of the AHI to 0.0/h with improvement of sleep apnea. A [employer] SIMPLUS in medium was used in this study, at home he has used an AirFit F20 FFM.   Lights Out was at 21:00 and Lights On at 05:05. Total recording time (TRT) was 485.5 minutes, with a total sleep time (TST) of 440.5 minutes. The patient's sleep latency  was 15 minutes. REM latency was 153 minutes.  The sleep efficiency was 90.7 %.    SLEEP ARCHITECTURE: WASO (Wake after sleep onset) was 28 minutes.  There were 15 minutes in Stage N1, 282 minutes Stage N2, 45.5 minutes Stage N3 and 98 minutes in Stage REM. The percentage of Stage N1 was 3.4%, Stage N2 was 64.%, Stage N3 was 10.3% and Stage R (REM sleep) was 22.2%.   RESPIRATORY ANALYSIS:  There was a total of 68 respiratory events: 7 obstructive apneas, 60 central apneas and 1 hypopnea with 0 respiratory event related arousals (RERAs).  The total APNEA/HYPOPNEA INDEX  (AHI) was 9.2 /hour and the total RESPIRATORY DISTURBANCE INDEX was 9.2 /hour,  1 event occurred in REM sleep and 67 events in NREM. The REM AHI was 0.6 /hour versus a non-REM AHI of 11.7 /hour.  The patient spent 440.5 minutes of total sleep time in the supine position and 0 minutes in non-supine. The supine AHI was 9.2, versus a non-supine AHI of 0.0.  OXYGEN SATURATION & C02:  The baseline 02 saturation was 95%, with the lowest being 78%. Time spent below 89% saturation equaled 3 minutes.  AROUSALS/ PERIODIC LIMB MOVEMENTS:  The patient had a total of 0 Periodic Limb Movements. The arousals were noted as: 59 were spontaneous, 0 were associated with PLMs, and 15 were associated with respiratory events.  Audio and video analysis did not show any  abnormal or unusual movements, behaviors, phonations or vocalizations.  The patient took one bathroom break. Snoring was no longer noted while on CPAP 13 cm water. EKG showed paced rhythm, some PVCs. Post-study, the patient indicated that sleep quality was much improved over his usual sleep.    DIAGNOSIS 1. Central dominant Complex  Sleep Apnea at Severe degree is responding well to CPAP at 13 cm water, which eliminated snoring and reduced arousals.  2. No Sleep Related Hypoxemia. 3. Pacemaker EKG.  PLANS/RECOMMENDATIONS:  CPAP - auto-titration capable - will be set for a pressure window  from 10-14 cm water, 1 cm EPR and new mask by [employer], Simplus model in medium size. 1. CPAP therapy compliance is defined as 4 hours or more of nightly use. 2. The patient should avoid evening sedatives, hypnotics, and alcohol beverage consumption   A follow up appointment will be scheduled in the Sleep Clinic at [facility].   Please call [phone] with any questions.      I certify that I have reviewed the entire raw data recording prior to the issuance of this report in accordance with the Standards of Accreditation of the American Academy of Sleep Medicine (AASM)     [name], M.D.   09-26-2017  Diplomat, American Board of Psychiatry and Neurology  Diplomat, [place] [facility] [employer] [facility] Medical Director, [place] [facility]

## 2017-09-27 ENCOUNTER — Ambulatory Visit: Payer: Medicare HMO | Admitting: Physical Therapy

## 2017-09-27 ENCOUNTER — Telehealth: Payer: Self-pay | Admitting: Neurology

## 2017-09-27 DIAGNOSIS — N183 Chronic kidney disease, stage 3 (moderate): Secondary | ICD-10-CM | POA: Diagnosis not present

## 2017-09-27 DIAGNOSIS — R001 Bradycardia, unspecified: Secondary | ICD-10-CM | POA: Diagnosis not present

## 2017-09-27 DIAGNOSIS — I10 Essential (primary) hypertension: Secondary | ICD-10-CM | POA: Diagnosis not present

## 2017-09-27 DIAGNOSIS — R55 Syncope and collapse: Secondary | ICD-10-CM | POA: Diagnosis not present

## 2017-09-27 DIAGNOSIS — R42 Dizziness and giddiness: Secondary | ICD-10-CM | POA: Diagnosis not present

## 2017-09-27 DIAGNOSIS — Z950 Presence of cardiac pacemaker: Secondary | ICD-10-CM | POA: Diagnosis not present

## 2017-09-27 DIAGNOSIS — J449 Chronic obstructive pulmonary disease, unspecified: Secondary | ICD-10-CM | POA: Diagnosis not present

## 2017-09-27 DIAGNOSIS — Z6824 Body mass index (BMI) 24.0-24.9, adult: Secondary | ICD-10-CM | POA: Diagnosis not present

## 2017-09-27 NOTE — Telephone Encounter (Signed)
Pt daughter(Eisenhuth,Robin- [phone] on DPR) has called and asked that an appointment be scheduled.  It is not clear as to what type appointment is needed at this time for pt.  Please call

## 2017-09-27 NOTE — Telephone Encounter (Signed)
Called the daughter back and reviewed sleep study results with her. She is requesting an apt because she wants to discuss this further from transitioning from ASV machine to CPAP. I had an opening on 09/29/17 at 11:00 am. She accepted that apt and will check in between 10:30-10:45 am.

## 2017-09-28 ENCOUNTER — Encounter

## 2017-09-28 ENCOUNTER — Ambulatory Visit (INDEPENDENT_AMBULATORY_CARE_PROVIDER_SITE_OTHER): Payer: Medicare HMO | Admitting: *Deleted

## 2017-09-28 DIAGNOSIS — I4589 Other specified conduction disorders: Secondary | ICD-10-CM

## 2017-09-28 DIAGNOSIS — Z950 Presence of cardiac pacemaker: Secondary | ICD-10-CM

## 2017-09-28 DIAGNOSIS — R001 Bradycardia, unspecified: Secondary | ICD-10-CM | POA: Diagnosis not present

## 2017-09-28 LAB — CUP PACEART INCLINIC DEVICE CHECK
Battery Remaining Longevity: 164 mo
Battery Voltage: 3.21 V
Brady Statistic AP VS Percent: 45.39 %
Brady Statistic AS VS Percent: 54.56 %
Brady Statistic RA Percent Paced: 45.11 %
Implantable Lead Location: 753859
Implantable Lead Model: 5076
Implantable Pulse Generator Implant Date: 20190823
Lead Channel Impedance Value: 323 Ohm
Lead Channel Impedance Value: 437 Ohm
Lead Channel Pacing Threshold Amplitude: 0.5 V
Lead Channel Pacing Threshold Amplitude: 0.75 V
Lead Channel Pacing Threshold Pulse Width: 0.4 ms
Lead Channel Sensing Intrinsic Amplitude: 4 mV
Lead Channel Setting Pacing Amplitude: 3.5 V
Lead Channel Setting Pacing Pulse Width: 0.4 ms
MDC IDC LEAD IMPLANT DT: 20190823
MDC IDC LEAD IMPLANT DT: 20190823
MDC IDC LEAD LOCATION: 753860
MDC IDC MSMT LEADCHNL RA PACING THRESHOLD PULSEWIDTH: 0.4 ms
MDC IDC MSMT LEADCHNL RV IMPEDANCE VALUE: 380 Ohm
MDC IDC MSMT LEADCHNL RV IMPEDANCE VALUE: 494 Ohm
MDC IDC MSMT LEADCHNL RV SENSING INTR AMPL: 14.75 mV
MDC IDC SESS DTM: 20190904204948
MDC IDC SET LEADCHNL RA PACING AMPLITUDE: 3.5 V
MDC IDC SET LEADCHNL RV SENSING SENSITIVITY: 1.2 mV
MDC IDC STAT BRADY AP VP PERCENT: 0.02 %
MDC IDC STAT BRADY AS VP PERCENT: 0.03 %
MDC IDC STAT BRADY RV PERCENT PACED: 0.05 %

## 2017-09-28 NOTE — Progress Notes (Signed)
Wound check appointment. Steri-strips removed. Wound without redness or edema. Incision edges approximated, wound well healed. Normal device function. Thresholds, sensing, and impedances consistent with implant measurements. RA/RV outputs at 3.5V with auto capture programmed on for extra safety margin until 3 month visit. Histogram distribution appears appropriate, though patient's daughter reports some ShOB with walking; discussed with GT, rate response turned on at nominal settings. Patient walked in office after reprogramming and reports some improvement in exertional ShOB. No mode switches or high ventricular rates noted. Patient educated about wound care, arm mobility, lifting restrictions, and Carelink monitor. ROV with GT in 3 months.

## 2017-09-29 ENCOUNTER — Ambulatory Visit: Payer: Medicare HMO | Admitting: Physical Therapy

## 2017-09-29 ENCOUNTER — Other Ambulatory Visit: Payer: Self-pay | Admitting: Neurology

## 2017-09-29 ENCOUNTER — Ambulatory Visit (INDEPENDENT_AMBULATORY_CARE_PROVIDER_SITE_OTHER): Payer: Medicare HMO | Admitting: Neurology

## 2017-09-29 ENCOUNTER — Encounter: Payer: Self-pay | Admitting: Neurology

## 2017-09-29 ENCOUNTER — Telehealth: Payer: Self-pay | Admitting: Neurology

## 2017-09-29 VITALS — BP 142/89 | HR 87 | Ht 75.0 in | Wt 182.0 lb

## 2017-09-29 DIAGNOSIS — G4731 Primary central sleep apnea: Secondary | ICD-10-CM

## 2017-09-29 DIAGNOSIS — Z7189 Other specified counseling: Secondary | ICD-10-CM

## 2017-09-29 DIAGNOSIS — Z8673 Personal history of transient ischemic attack (TIA), and cerebral infarction without residual deficits: Secondary | ICD-10-CM | POA: Diagnosis not present

## 2017-09-29 DIAGNOSIS — R001 Bradycardia, unspecified: Secondary | ICD-10-CM | POA: Diagnosis not present

## 2017-09-29 DIAGNOSIS — G459 Transient cerebral ischemic attack, unspecified: Secondary | ICD-10-CM | POA: Diagnosis not present

## 2017-09-29 DIAGNOSIS — R55 Syncope and collapse: Secondary | ICD-10-CM

## 2017-09-29 DIAGNOSIS — G4710 Hypersomnia, unspecified: Secondary | ICD-10-CM

## 2017-09-29 DIAGNOSIS — G4733 Obstructive sleep apnea (adult) (pediatric): Secondary | ICD-10-CM

## 2017-09-29 NOTE — Progress Notes (Signed)
SLEEP MEDICINE CLINIC   Provider:  [name], M.D.    Primary Care Physician:  [name], MD   Referring Provider: Antony Contras, MD and Venancio Poisson, NP     Chief Complaint  Patient presents with  . Follow-up    pt with daughter, rm 64. the daughter had a lot of concerns which i addressed. here to talk about this sleep study. the patient was on a ASV machine and now is being recommended a auto CPAP and the daughter had concerns about the change in the machine    HPI:  09-29-2017, Derek Carr . Derek Carr is a 84 y.o. male, seen here with his daughter ,after repeated TIA -strokes and syncopes.  He also was feeling no benefit from BiPAP use -he  had been using ASV since 2014. Patient has in the meantime seen Dr. Lovena Le for a loop recorder, and ended up instead with a pacemaker - He had a syncope from bradycardia.  *The patient had actually used a ASV, after he had been diagnosed with complex and mostly central sleep apnea in August 2015, CPAP at low pressures cost central apneas to arise with an AHI of 27.7 and supine sleep there was an accentuation of 34.3, after he failed CPAP and BiPAP ASV was initiated at the time he used a pressure support minimum of 4 maximum of 10 cmH2O and end expiratory pressure of 5 cmH2O.  The AHI reached 0.0 and the patient actually could finally sleep the sleep efficiency rose to 57% from previously only 20% he also had no longer oxygen desaturation he had no PLM's, but he remained in what I suspected was atrial fibrillation with a variable R to R interval.  Derek Carr now returned first for a home sleep study just to screen if apnea was still present and to work severity.  A watch [name] was used the study was performed on 24 August 2017 after referral by Foye Clock, it revealed complex and dominantly central sleep apnea without hypoxemia.  There was tachycardia and bradycardia noted, the non-REM AHI was higher than the REM sleep AHI indicating the  central nature of apnea.  In supine sleep AHI was 53.4 on his left side AHI was 4.2/h.  The patient had endorsed the Epworth score at 20 points FS is at 47 points, BMI was 22.7.  He was asked to return for a titration. The titration to continuous positive airway pressure took place on 23 September 2017 the patient started 55 cm water pressure and was advanced to 13 sleep latency was only 15 minutes and sleep efficiency was 90.7% his central sleep apnea responded well to CPAP at 13 cmH2O was eliminated also snoring and reduced all arousals.  He did not have hypoxemia and he did have a paced EKG rhythm.  He was fitted to a Risk manager and Paykel FFM model Simplus in medium size .   I believe that his pacemaker corrected the diastolic heart failure and improved response to CPAP.    07-07-2017.  Derek Carr is a 84 y.o. male , seen here as in a referral from [place] ,  After repeated TIA -STROKE team patient , last seen by Dory Peru, NP .  Patient does have a diagnosis of sleep apnea but is having increasingly trouble using his CPAP mask.  The daughter noticed excessive daytime sleepiness and he can fall asleep even in the middle of conversations. Recommended Derek Carr with me for CPAP follow up . The patient  denies any significant weight loss, muscle fasciculations, muscle wasting or weakness. He is currently on dual antiplatelet therapy aspirin and Plavix daughter get well without bruising or bleeding. The patient was unable to use his CPAP for the last year, according to his daughter.  He has a very high fall risk, he urinates frequently at night and he has to rise from the bed to relieve himself.  She felt that the CPAP was an additional endurance something he struggled with at night to take off and put back on. During the office follow-up with Janett Billow from 21 Jun 2017 she did an extensive medical records review the stroke service admissions before.  She also stated that in her previous appointment MD  had recommended to start Aricept -but given his dizziness and tendency to have bradycardia Aricept was not started and should not be started.   Memory and concentration have been relatively stable but there is evidence of vascular dementia, he continues to take his aspirin without bleeding or bruising he does not have gum bleeding.  No nosebleeds.  EEG was normal and negative for seizure activity.  PT OT continues to come to the house for the first month, after he suffered a fracture to his left ankle.  He is currently using a boot.  There have been no worsening stroke symptoms or TIA symptoms since his last hospitalization in February.      03-21-2017 : Dr Leonie Man, primary neurologist  HPI: Derek Carr is a 84 year old African-American gentleman who is accompanied today by his daughter. History is obtained from them as well as review of electronic medical records. I have personally reviewed imaging films.Derek Carr is an 84 y.o. male with hypertension and hyperlipidemia.    Per patient approximately 7:00 in the morning on 03/01/2017 patient went to get a right cataract surgery.  He did not stop his aspirin or Plavix for this per patient.  He then took a nap at approximately 1300 hrs. patient awoke at 6 PM and daughter called EMSfor right-sided weakness, facial droop, dysarthria.  Daughter states that he was confused but patient states that he was having a difficult time understanding him which would be more of a receptive aphasia.    By the time EMS arrived (approximately 10 minutes ) all these symptoms had resolved the exception of the right upper extremity weakness--which also quickly resolved.  Patient is noted to have a TIA back in December 2018 (as noted below ) and was placed on Plavix along with his aspirin by his primary care physician.  Patient was not seen in the hospital at that time.Per daughter patient has had one further event prior to this.  At that time they noticed that he had a left  facial droop along with leaning to the left.  He did not have any dysarthria at that time. The symptoms lasted very quickly for approximately 10 minutes.   Sleep habits are as follows: The patient's bedtime is around 8. - 8.30 PM and he is asleep promptly, but soon wakes for 4-7 bathroom breaks- very frequently.  Uses a urinal but needs to stand up, when urinating in a seated position ' flow is slow" . Some urge  incontinence. He denies dreaming frequently , rarely night mares, but he yells out in his sleep , kicks , he is not waking up and has The patient sleeps in a regular bed. Not adjustable, uses 1 pillow. Sleeps supine since he needed to adjust due to FFM, now still supine  without CPAP use.  He rises at 8.30 AM, and averages 8 hours of sleep with 12 hours in bed , and by 10 or 11 he can easily take another nap.   Social history: daughter is main caretaker.   Review of Systems: Out of a complete 14 system review, the patient complains of only the following symptoms, and all other reviewed systems are negative. Mr. Ambrocio endorsed 20 points out of 24 possible points in the Epworth Sleepiness Scale before the new PSG and he is now on CPAP , and  endorsed the new Epworth at 13/ 24 points. benefitted from CPAP.   and the fatigue severity scale  39 from 47 points,  he does not appear to be depressed he is jovial and outgoing, his geriatric depression score was endorsed at 4 out of 15 points which is not significant.  REM BD    Social History   Socioeconomic History  . Marital status: Married    Spouse name: Alison Murray  . Number of children: 4  . Years of education: College  . Highest education level: Not on file  Occupational History  . Occupation:       Employer: [employer]    Comment: Retired  Scientific laboratory technician  . Financial resource strain: Not on file  . Food insecurity:    Worry: Not on file    Inability: Not on file  . Transportation needs:    Medical: Not on file     Non-medical: Not on file  Tobacco Use  . Smoking status: Former Smoker    Packs/day: 0.50    Years: 62.00    Pack years: 31.00    Types: Cigarettes  . Smokeless tobacco: Never Used  . Tobacco comment: smoke one or two per day  Substance and Sexual Activity  . Alcohol use: No    Alcohol/week: 1.0 standard drinks    Types: 1 Cans of beer per week  . Drug use: No    Comment: quit smoking 02/2012  . Sexual activity: Not on file  Lifestyle  . Physical activity:    Days per week: Not on file    Minutes per session: Not on file  . Stress: Not on file  Relationships  . Social connections:    Talks on phone: Not on file    Gets together: Not on file    Attends religious service: Not on file    Active member of club or organization: Not on file    Attends meetings of clubs or organizations: Not on file    Relationship status: Not on file  . Intimate partner violence:    Fear of current or ex partner: Not on file    Emotionally abused: Not on file    Physically abused: Not on file    Forced sexual activity: Not on file  Other Topics Concern  . Not on file  Social History Narrative   Patient is Married Designer, television/film set), and lives at home with his wife 24 years   Patient has 3 daughters and 1 son.   Patient has a college education.   Patient is right-handed.   Patient drinks one cup of coffee, 1-2 cups of tea daily and rarely drinks soda.   Retired from [employer] History  Problem Relation Age of Onset  . Hypertension Mother   . Prostate cancer Brother        surgery/cured  . Lung cancer Brother        new primary/same  brother    Past Medical History:  Diagnosis Date  . AAA (abdominal aortic aneurysm) ([place])   . Anemia   . Ankle fracture    left  . Aortic regurgitation   . Atherosclerosis   . Complication of anesthesia    CONFUSION - Pt's family very concerned about this  . DDD (degenerative disc disease)   . Degenerative arthritis   . Dermatitis, atopic ARMS AND  LEGS  . Diverticulosis   . Erectile dysfunction   . Frequency of urination   . Glaucoma   . History of asbestos exposure   . History of radiation therapy 8/19-13-10/18/11   prostate, 76 GY  . History of shingles 2012-- BILATERAL EYES--  NO RESIDUAL  . Hyperlipidemia   . Hypertension   . Inguinal hernia   . Lumbar scoliosis   . Nocturia   . OA (osteoarthritis) of hip RIGHT  . Prostate cancer ([place]) 05/11/2011   bx=Adenocarcinoma,gleason3+4=7, 4+4=8,PSA=10.30volume=45.7cc  . Renal cyst    bilateral  . Smokers' cough ([place])   . Stroke ([place])   . Thyroid cyst     Past Surgical History:  Procedure Laterality Date  . ATTEMPTED LEFT VATS/ LEFT THORACOTOMY/ RESECTION OF THE ENORMOUS, PROBABLE BRONCHOGENIC CYST  01-04-2005  DR Arlyce Dice  . CARDIAC CATHETERIZATION  02-24-2009  DR NASHER   MINOR CORONARY ARTERY IRREGULARITIES/ NORMAL LVSF/ EF 65-70%  . CATARACT EXTRACTION    . COLONOSCOPY W/ POLYPECTOMY    . CYSTOSCOPY  11/15/2011   Procedure: CYSTOSCOPY;  Surgeon: Dutch Gray, MD;  Location: [facility];  Service: Urology;  Laterality: N/A;  no seeds seen in bladder  . esophageus cyst removal  YRS AGO  . [place]  . ORIF ANKLE FRACTURE Left 05/03/2017  . ORIF ANKLE FRACTURE Left 05/03/2017   Procedure: OPEN REDUCTION INTERNAL FIXATION (ORIF) BIMALLEOLAR  ANKLE FRACTURE;  Surgeon: Wylene Simmer, MD;  Location: [facility];  Service: Orthopedics;  Laterality: Left;  . PACEMAKER IMPLANT N/A 09/16/2017   Procedure: PACEMAKER IMPLANT;  Surgeon: Constance Haw, MD;  Location: [facility];  Service: Cardiovascular;  Laterality: N/A;  . PROSTATE BIOPSY  05/11/11   Adenocarcinoma ([facility] OFFICE)  . RADIOACTIVE SEED IMPLANT  11/15/2011   Procedure: RADIOACTIVE SEED IMPLANT;  Surgeon: Dutch Gray, MD;  Location: [facility];  Service: Urology;  Laterality: N/A;  Total number of seeds - 52  . REPAIR RIGHT INGUINAL HERNIA W/ MESH  09-10-1999  . TOTAL HIP  ARTHROPLASTY Right 03/20/2013   Procedure: RIGHT TOTAL HIP ARTHROPLASTY ANTERIOR APPROACH;  Surgeon: [name], MD;  Location: [facility];  Service: Orthopedics;  Laterality: Right;  . TOTAL HIP REVISION Right 05/14/2013   Procedure: open reduction internal fixation REVISION RIGHT HIP ;  Surgeon: [name], MD;  Location: [facility];  Service: Orthopedics;  Laterality: Right;  . UMBILICAL HERNIA REPAIR  1998   epigastric    Current Outpatient Medications  Medication Sig Dispense Refill  . acetaminophen (TYLENOL) 500 MG tablet Take 1,000 mg by mouth 2 (two) times daily.     Marland Kitchen aspirin EC 81 MG tablet Take 81 mg by mouth daily.    . Calcium Carbonate Antacid (TUMS PO) Take 1 tablet by mouth daily.    . cetirizine (ZYRTEC) 10 MG tablet Take 10 mg by mouth daily.    . Cholecalciferol (VITAMIN D-3) 1000 units CAPS Take 1 capsule by mouth 2 (two) times daily.     . clobetasol cream (TEMOVATE)  1.61 % Apply 1 application topically daily at 12 noon.    . Cyanocobalamin (VITAMIN B12) 1000 MCG TBCR Take 1 tablet by mouth 2 (two) times daily.     . Docosahexaenoic Acid (DHA OMEGA 3 PO) Take 1,000 mg by mouth daily.     Marland Kitchen docusate sodium (COLACE) 100 MG capsule Take 1 capsule (100 mg total) by mouth 2 (two) times daily. While taking narcotic pain medicine. 30 capsule 0  . folic acid (FOLVITE) 1 MG tablet Take 1 mg by mouth daily.    . irbesartan (AVAPRO) 75 MG tablet Take 37.5 mg by mouth daily. **REPLACES VALSARTAN**  11  . Omega 3-6-9 Fatty Acids (OMEGA DHA PO) Take 1 tablet by mouth at bedtime.     . Probiotic Product (PROBIOTIC PO) Take 1 capsule by mouth daily.     Marland Kitchen senna (SENOKOT) 8.6 MG TABS tablet Take 2 tablets (17.2 mg total) by mouth 2 (two) times daily. 30 each 0  . Ubiquinol (QUNOL COQ10/UBIQUINOL/MEGA) 100 MG CAPS Take 1 tablet by mouth at bedtime.     . valACYclovir (VALTREX) 1000 MG tablet Take 1,000 mg by mouth daily.     No current facility-administered medications for this visit.      Allergies as of 09/29/2017 - Review Complete 09/29/2017  Allergen Reaction Noted  . Oxycodone Other (See Comments) 06/21/2017  . Dilaudid [hydromorphone hcl] Other (See Comments) 05/07/2013  . Methocarbamol Other (See Comments) 05/07/2013  . Percodan [oxycodone-aspirin] Other (See Comments) 05/07/2013  . Tramadol Other (See Comments) 05/07/2013  . Vicodin [hydrocodone-acetaminophen] Other (See Comments) 05/07/2013    Vitals: BP (!) 142/89   Pulse 87   Ht 6\' 3"  (1.905 m)   Wt 182 lb (82.6 kg)   BMI 22.75 kg/m  Last Weight:  Wt Readings from Last 1 Encounters:  09/29/17 182 lb (82.6 kg)   WRU:EAVW mass index is 22.75 kg/m.     Last Height:   Ht Readings from Last 1 Encounters:  09/29/17 6\' 3"  (1.905 m)    Physical exam:  General: The patient is awake, alert and appears not in acute distress. The patient is well groomed. He had shingles three times, in the face, and affecting the right eye .  Head: Normocephalic, atraumatic. Neck is supple. Mallampati 3,  neck circumference: 17"  Nasal airflow patent -  Retrognathia is mild.  Cardiovascular:  Regular rate and rhythm , without  murmurs or carotid bruit, and without distended neck veins. Respiratory: Lungs are clear to auscultation. Skin:  Without evidence of edema, or rash Trunk: BMI is 22.5 .  The patient's posture is stooped , uses a walker   Neurologic exam : The patient is awake and alert,   MMSE - Mini Mental State Exam 06/21/2017 03/21/2017  Orientation to time 2 4  Orientation to Place 4 3  Registration 3 3  Attention/ Calculation 0 0  Recall 0 1  Language- name 2 objects 2 2  Language- repeat 1 1  Language- follow 3 step command 3 3  Language- read & follow direction 1 1  Write a sentence 1 1  Copy design 1 1  Total score 18 20      Attention span & concentration ability appears impaired . He is drowsy .  Speech is fluent,  without  dysarthria, but with mild dysphonia and  aphasia.  Mood and affect  are appropriate.  Cranial nerves: Pupils are equal and briskly reactive to light. . Extraocular movements  in vertical and  horizontal planes intact and without nystagmus. Visual fields by finger perimetry are intact. Hearing to finger rub intact.   Facial sensation intact to fine touch.  Facial motor strength is symmetric and tongue and uvula move midline. Shoulder shrug was symmetrical.   The patient's motor examination shows residual stroke symptoms, he does not have muscle wasting he does not have fasciculations, his left foot remains in a boot, his shoulders are of equal height bilaterally he has normal head turning, both hands show equal strength muscle bulk.  He has a history of loss of sensory from mid calf level down and numbness in the feet to vibration has been present for years.  Coordination is slowed rapid alternating movements are slowed, and finger-to-nose test was not tested today but was described bilaterally accurately in May.  He is not sitting in a wheelchair but walks with an walker.  Ankle jerks were depressed according to his neuropathy, and he has 1+ reflexes.  I agree with his stroke specialist that he should continue on aspirin, I would like his primary care physician to treat him for hypertension and hyperlipidemia, I do think that a Holter monitor would be of benefit given that he had recurrent TIAs that have been idiopathic cryptogenic.  I will order a sleep test for the patient in the order to make it convenient for him to evaluate if sleep apnea is the main factor behind his excessive daytime sleepiness, I offered a home sleep test.  Usually Medicare is not in favor of sleep test especially not in patients with a subacute stroke, however I think it would be harder for Mr. Kneisel to sleep in the lab.  He has very frequent nocturia and needs assistance and is at high fall risk.  I would based on the home sleep test results I will order a nasal pillow auto titration CPAP or use  his current CPAP and set it for a pressure with 3 cm EPR that should be addressing his current needs.     Assessment:  After physical and neurologic examination, review of laboratory studies,  Personal review of imaging studies, reports of other /same  Imaging studies, results of polysomnography and / or neurophysiology testing and pre-existing records as far as provided in visit., my assessment is   1)  Complex sleep apnea confirmed by HST, followed by CPAP titration.  EDS-now resolved or at least improved - on CPAP - not longer ASV or BiPAP- has more energy. [facility] was his established DME- he would like to switch. Was using a Simplus medium mask in the lab.   2)  Nocturia now at 2-3 times , from 4-7 times.  3)  Dr. Lovena Le diagnosed the syncope following documented  bradycardia , he is now on a pacemaker, and this likely reduced he diastolic heart failure, and central sleep apnea.      The patient was advised of the nature of the diagnosed disorder , the treatment options and the  risks for general health and wellness arising from not treating the condition.   I spent more than 35 minutes of face to face time with the patient.  Greater than 50% of time was spent in counseling and coordination of care. We have discussed the diagnosis and differential and I answered the patient's questions.    Needs to have is machine set to  13 cm CPAP by DME -  if that is not possible will write for an auto-titration capable CPAP , setting of 10-14 cm  water , 1 cm EPR .   [name], MD 06/03/4583, 92:92 AM  Certified in Neurology by ABPN Certified in Sleep Medicine by [facility] [address] [place],  [address]    Dr Leonie Man, Dr Tamala Julian. Dr Joylene Draft.

## 2017-09-29 NOTE — Telephone Encounter (Signed)
Called the patient's daughter and informed her that the pt is due for the new machine and since we are transferring care to a different DME company this would be the best way to do that by starting the auto cpap. I informed her the company we sent to was [facility] and gave the number for the patient to call if she hasn't heard from them. I will also rescheduled the pt apt for earlier since with the new machine insurance requires the patient is seen 31-90 days after starting the machine. Apt made 11/25/17 at 11:00 am with Ward Givens, NP

## 2017-09-29 NOTE — Telephone Encounter (Signed)
Pt went over the sleep study in more detail with Dr Brett Fairy in the office visit today. Pt would like to switch companies and is due to get a new machine. Order will be sent to start the auto CPAP machine. Orders will be sent to [facility] for the patient.

## 2017-10-03 ENCOUNTER — Ambulatory Visit: Payer: Medicare HMO | Admitting: Physical Therapy

## 2017-10-05 ENCOUNTER — Ambulatory Visit: Payer: Medicare HMO | Admitting: Physical Therapy

## 2017-10-06 DIAGNOSIS — H35033 Hypertensive retinopathy, bilateral: Secondary | ICD-10-CM | POA: Diagnosis not present

## 2017-10-06 DIAGNOSIS — H348322 Tributary (branch) retinal vein occlusion, left eye, stable: Secondary | ICD-10-CM | POA: Diagnosis not present

## 2017-10-06 DIAGNOSIS — H35373 Puckering of macula, bilateral: Secondary | ICD-10-CM | POA: Diagnosis not present

## 2017-10-06 DIAGNOSIS — H401132 Primary open-angle glaucoma, bilateral, moderate stage: Secondary | ICD-10-CM | POA: Diagnosis not present

## 2017-10-09 DIAGNOSIS — G4731 Primary central sleep apnea: Secondary | ICD-10-CM | POA: Diagnosis not present

## 2017-10-09 DIAGNOSIS — G8918 Other acute postprocedural pain: Secondary | ICD-10-CM | POA: Diagnosis not present

## 2017-10-09 DIAGNOSIS — R531 Weakness: Secondary | ICD-10-CM | POA: Diagnosis not present

## 2017-10-09 DIAGNOSIS — Z471 Aftercare following joint replacement surgery: Secondary | ICD-10-CM | POA: Diagnosis not present

## 2017-10-09 DIAGNOSIS — R351 Nocturia: Secondary | ICD-10-CM | POA: Diagnosis not present

## 2017-10-09 DIAGNOSIS — S82842A Displaced bimalleolar fracture of left lower leg, initial encounter for closed fracture: Secondary | ICD-10-CM | POA: Diagnosis not present

## 2017-10-09 DIAGNOSIS — R0602 Shortness of breath: Secondary | ICD-10-CM | POA: Diagnosis not present

## 2017-10-10 ENCOUNTER — Ambulatory Visit: Payer: Medicare HMO | Admitting: Physical Therapy

## 2017-10-11 DIAGNOSIS — R42 Dizziness and giddiness: Secondary | ICD-10-CM | POA: Diagnosis not present

## 2017-10-11 DIAGNOSIS — Z6824 Body mass index (BMI) 24.0-24.9, adult: Secondary | ICD-10-CM | POA: Diagnosis not present

## 2017-10-11 DIAGNOSIS — Z950 Presence of cardiac pacemaker: Secondary | ICD-10-CM | POA: Diagnosis not present

## 2017-10-11 DIAGNOSIS — I10 Essential (primary) hypertension: Secondary | ICD-10-CM | POA: Diagnosis not present

## 2017-10-11 DIAGNOSIS — G4730 Sleep apnea, unspecified: Secondary | ICD-10-CM | POA: Diagnosis not present

## 2017-10-12 ENCOUNTER — Ambulatory Visit: Payer: Medicare HMO | Admitting: Physical Therapy

## 2017-10-17 ENCOUNTER — Ambulatory Visit: Payer: Medicare HMO | Admitting: Physical Therapy

## 2017-10-17 ENCOUNTER — Ambulatory Visit: Payer: Medicare HMO | Attending: Student | Admitting: Physical Therapy

## 2017-10-17 DIAGNOSIS — M6281 Muscle weakness (generalized): Secondary | ICD-10-CM

## 2017-10-17 DIAGNOSIS — R278 Other lack of coordination: Secondary | ICD-10-CM | POA: Diagnosis not present

## 2017-10-17 DIAGNOSIS — R2681 Unsteadiness on feet: Secondary | ICD-10-CM | POA: Insufficient documentation

## 2017-10-17 DIAGNOSIS — M25672 Stiffness of left ankle, not elsewhere classified: Secondary | ICD-10-CM | POA: Insufficient documentation

## 2017-10-17 DIAGNOSIS — R42 Dizziness and giddiness: Secondary | ICD-10-CM | POA: Insufficient documentation

## 2017-10-17 DIAGNOSIS — R2689 Other abnormalities of gait and mobility: Secondary | ICD-10-CM

## 2017-10-17 DIAGNOSIS — G4733 Obstructive sleep apnea (adult) (pediatric): Secondary | ICD-10-CM | POA: Diagnosis not present

## 2017-10-18 ENCOUNTER — Ambulatory Visit: Payer: Medicare HMO | Admitting: Physical Therapy

## 2017-10-18 ENCOUNTER — Encounter: Payer: Self-pay | Admitting: Physical Therapy

## 2017-10-18 DIAGNOSIS — M6281 Muscle weakness (generalized): Secondary | ICD-10-CM

## 2017-10-18 DIAGNOSIS — R42 Dizziness and giddiness: Secondary | ICD-10-CM | POA: Diagnosis not present

## 2017-10-18 DIAGNOSIS — R2681 Unsteadiness on feet: Secondary | ICD-10-CM

## 2017-10-18 DIAGNOSIS — R278 Other lack of coordination: Secondary | ICD-10-CM | POA: Diagnosis not present

## 2017-10-18 DIAGNOSIS — R2689 Other abnormalities of gait and mobility: Secondary | ICD-10-CM | POA: Diagnosis not present

## 2017-10-18 DIAGNOSIS — M25672 Stiffness of left ankle, not elsewhere classified: Secondary | ICD-10-CM | POA: Diagnosis not present

## 2017-10-18 NOTE — Therapy (Signed)
[facility] [address] [place], [place], [address] Phone: [phone]   Fax:  [phone]  Physical Therapy Treatment  Patient Details  Name: Derek Carr MRN: [id number] Date of Birth: [birth year] Referring Provider: Mechele Claude, PA   Encounter Date: 10/18/2017  PT End of Session - 10/18/17 1319    Visit Number  14   recert done visit 13 so PN visit 23   Number of Visits  39    Date for PT Re-Evaluation  01/13/18    Authorization Type  [employer] Medicare & Generic commercial 2nd    Authorization Time Period  Progress Note due visit 23    PT Start Time  1230    PT Stop Time  1315    PT Time Calculation (min)  45 min    Equipment Utilized During Treatment  Gait belt    Activity Tolerance  Patient tolerated treatment well;No increased pain    Behavior During Therapy  WFL for tasks assessed/performed       Past Medical History:  Diagnosis Date  . AAA (abdominal aortic aneurysm) ([place])   . Anemia   . Ankle fracture    left  . Aortic regurgitation   . Atherosclerosis   . Complication of anesthesia    CONFUSION - Pt's family very concerned about this  . DDD (degenerative disc disease)   . Degenerative arthritis   . Dermatitis, atopic ARMS AND LEGS  . Diverticulosis   . Erectile dysfunction   . Frequency of urination   . Glaucoma   . History of asbestos exposure   . History of radiation therapy 8/19-13-10/18/11   prostate, 42 GY  . History of shingles 2012-- BILATERAL EYES--  NO RESIDUAL  . Hyperlipidemia   . Hypertension   . Inguinal hernia   . Lumbar scoliosis   . Nocturia   . OA (osteoarthritis) of hip RIGHT  . Prostate cancer ([place]) 05/11/2011   bx=Adenocarcinoma,gleason3+4=7, 4+4=8,PSA=10.30volume=45.7cc  . Renal cyst    bilateral  . Smokers' cough ([place])   . Stroke ([place])   . Thyroid cyst     Past Surgical History:  Procedure Laterality Date  . ATTEMPTED LEFT VATS/ LEFT THORACOTOMY/ RESECTION OF  THE ENORMOUS, PROBABLE BRONCHOGENIC CYST  01-04-2005  DR Arlyce Dice  . CARDIAC CATHETERIZATION  02-24-2009  DR NASHER   MINOR CORONARY ARTERY IRREGULARITIES/ NORMAL LVSF/ EF 65-70%  . CATARACT EXTRACTION    . COLONOSCOPY W/ POLYPECTOMY    . CYSTOSCOPY  11/15/2011   Procedure: CYSTOSCOPY;  Surgeon: Dutch Gray, MD;  Location: [facility];  Service: Urology;  Laterality: N/A;  no seeds seen in bladder  . esophageus cyst removal  YRS AGO  . [place]  . ORIF ANKLE FRACTURE Left 05/03/2017  . ORIF ANKLE FRACTURE Left 05/03/2017   Procedure: OPEN REDUCTION INTERNAL FIXATION (ORIF) BIMALLEOLAR  ANKLE FRACTURE;  Surgeon: Wylene Simmer, MD;  [facility]: [facility];  Service: Orthopedics;  Laterality: Left;  . PACEMAKER IMPLANT N/A 09/16/2017   Procedure: PACEMAKER IMPLANT;  Surgeon: Constance Haw, MD;  Location: [facility];  Service: Cardiovascular;  Laterality: N/A;  . PROSTATE BIOPSY  05/11/11   Adenocarcinoma ([facility] OFFICE)  . RADIOACTIVE SEED IMPLANT  11/15/2011   Procedure: RADIOACTIVE SEED IMPLANT;  Surgeon: Dutch Gray, MD;  Location[facility];  Service: Urology;  Laterality: N/A;  Total number of seeds - 52  . REPAIR RIGHT INGUINAL HERNIA W/ MESH  09-10-1999  .  TOTAL HIP ARTHROPLASTY Right 03/20/2013   Procedure: RIGHT TOTAL HIP ARTHROPLASTY ANTERIOR APPROACH;  Surgeon: [name], MD;  [facility];  Service: Orthopedics;  Laterality: Right;  . TOTAL HIP REVISION Right 05/14/2013   Procedure: open reduction internal fixation REVISION RIGHT HIP ;  Surgeon: [name], MD;  [facility];  Service: Orthopedics;  Laterality: Right;  . UMBILICAL HERNIA REPAIR  1998   epigastric    There were no vitals filed for this visit.  Subjective Assessment - 10/18/17 1230    Subjective  No issues with BP or light-headedness today. No soreness from evaluation.     Patient is accompained by:  Family member    Pertinent History  left ankle  fracture, TIAs, DDD, Lumbar scoliosis, Glaucoma, cataract surgery, HTN, right THR 03/20/13 with revision 05/14/13, right femur fracture, dementia/Alzheimers    Limitations  Standing;Walking;House hold activities    Patient Stated Goals  Patient wants to improve his balance & walking    Currently in Pain?  No/denies                       [facility] Adult PT Treatment/Exercise - 10/18/17 1230      Ambulation/Gait   Ambulation/Gait  Yes    Ambulation/Gait Assistance  5: Supervision;4: Min assist    Ambulation/Gait Assistance Details  tactile & verbal cues on upright posture & increased foot clearance.     Ambulation Distance (Feet)  300 Feet   300' X 3, last 300' outdoors 40' on grass   Assistive device  Straight cane   quad tip   Gait Pattern  Step-through pattern;Decreased stance time - left;Decreased weight shift to left;Lateral hip instability;Wide base of support;Decreased stride length;Decreased step length - left;Poor foot clearance - right    Ambulation Surface  Paved;Grass;Outdoor;Indoor    Ramp  4: Min assist   cane quad tip   Curb  4: Min assist   cane quad tip     High Level Balance   High Level Balance Activities  Head turns;Backward walking;Side stepping   intermittent touch on parallel bars & min guard PT   High Level Balance Comments  tactile & verbal cues on maintaining path & balance reactions,  upright posture & increased foot clearance      Knee/Hip Exercises: Stretches   Active Hamstring Stretch  Right;Left;2 reps;20 seconds   seated with LE extended   Gastroc Stretch  Right;Left;2 reps;20 seconds   seated with knee extended cane to dorsiflex foot   Other Knee/Hip Stretches  standing with posterior pelvis against bar with BUE support: posterior trunk lean to stretch hip flexors & increase lumbar extension  2 deep breath hold 3 reps      Knee/Hip Exercises: Standing   Hip Extension  AROM;Right;Left;5 reps;Knee straight    Extension Limitations  BUE  support          Balance Exercises - 10/18/17 1230      Balance Exercises: Standing   Standing Eyes Opened  Wide (BOA);Head turns;Solid surface;5 reps    Standing Eyes Closed  Wide (BOA);Solid surface;10 secs;2 reps    Stepping Strategy  Anterior;Posterior;Lateral;Foam/compliant surface;5 reps   alt stepping on/stabilze/off; minA/tactile cues   Rockerboard  Anterior/posterior;Lateral;EO;Head turns;5 reps;Intermittent UE support   wt shifts, stabilization, recovery   Heel Raises Limitations  heel/toe raises x10, 1 UE support          PT Short Term Goals - 10/17/17 1824  PT SHORT TERM GOAL #1   Title  Pt will be independent with updated HEP exercises (All STGs Target Date 11/16/2017)    Baseline        Time  30    Period  Days    Status  On-going    Target Date  11/16/17      PT SHORT TERM GOAL #2   Title  Patient ambulates 250' paved surfaces with cane with supervision.     Baseline        Time  30    Period  Days    Status  On-going    Target Date  11/16/17      PT SHORT TERM GOAL #3   Title  Timed Up & Go with cane <16sec with supervision.     Baseline        Time  30    Period  Days    Status  On-going    Target Date  11/16/17      PT SHORT TERM GOAL #4   Title  Patient standing balance reaching 10" anteriorly, to floor and looks over shoulders without balance loss with supervision.     Baseline        Time  30    Period  Days    Status  On-going    Target Date  11/16/17      PT SHORT TERM GOAL #5   Title  Patient negotiates ramps & curbs with cane with min guard.     Baseline        Time  30    Period  Days    Status  New    Target Date  11/16/17        PT Long Term Goals - 10/17/17 1822      PT LONG TERM GOAL #1   Title  Pt will demonstrate independence with ongoing HEP program and fitness plan (All LTGs Target Date 01/13/2018)    Time  90    Period  Days    Status  On-going    Target Date  01/13/18      PT LONG TERM GOAL #2    Title  Patient ambulates Modified Independent 500' outdoors including grass, pavement, curbs, ramps with cane to improve community ambulatory skills    Time  90    Period  Days    Status  On-going    Target Date  01/13/18      PT LONG TERM GOAL #3   Title  Berg Balance >/= 45/56 to indicate lower fall risk.     Time  90    Period  Days    Status  On-going    Target Date  01/13/18      PT LONG TERM GOAL #4   Title  Timed Up-Go time with cane <13.5 seconds to indicate lower fall risk.     Time  90    Period  Days    Status  On-going    Target Date  01/13/18      PT LONG TERM GOAL #5   Title  Dynamic Gait Index with cane >12/24 to indicate lower fall risk.     Time  90    Period  Days    Status  New    Target Date  01/13/18            Plan - 10/18/17 1637    Clinical Impression Statement  Patient tolerated session today with focus on  gait & dynamic balance activities. He has weakness in hip extensors noted in gait & balance. Balance focused on activities to facilitate ankle, hip & step strategies.     Rehab Potential  Good    PT Frequency  2x / week    PT Duration  12 weeks    PT Treatment/Interventions  Gait training;Functional mobility training;Neuromuscular re-education;Balance training;Therapeutic exercise;Therapeutic activities;Patient/family education;ADLs/Self Care Home Management;Stair training;Orthotic Fit/Training;Manual techniques;Vestibular;Canalith Repostioning    PT Next Visit Plan  update HEP with stretches & standing balance near support.  balance activities and gait with cane.    Consulted and Agree with Plan of Care  Patient       Patient will benefit from skilled therapeutic intervention in order to improve the following deficits and impairments:  Abnormal gait, Decreased activity tolerance, Decreased balance, Decreased endurance, Decreased knowledge of use of DME, Decreased mobility, Decreased range of motion, Decreased safety awareness, Decreased  strength, Impaired flexibility, Postural dysfunction, Dizziness  Visit Diagnosis: Muscle weakness (generalized)  Unsteadiness on feet  Other abnormalities of gait and mobility  Dizziness and giddiness  Other lack of coordination     Problem List Patient Active Problem List   Diagnosis Date Noted  . Bradycardia 09/16/2017  . CKD (chronic kidney disease), stage III ([place]) 09/15/2017  . Near syncope 09/14/2017  . Post-operative pain   . Dementia without behavioral disturbance   . TIA (transient ischemic attack)   . Reactive hypertension   . Bimalleolar fracture of left ankle 05/03/2017  . Axonal neuropathy 03/21/2017  . Right sided weakness 03/01/2017  . Cigarette smoker 06/16/2016  . Dyspnea on exertion 06/15/2016  . Alzheimer disease 01/07/2014  . CSA (central sleep apnea) 10/05/2013  . Syncope 06/26/2013  . COPD GOLD II 06/25/2013  . BPH (benign prostatic hyperplasia) 05/18/2013  . Glaucoma 05/18/2013  . S/P right TH revision 05/14/2013  . Constipation 03/26/2013  . Osteoporosis 03/26/2013  . S/P right THA, AA 03/20/2013  . Hyperlipidemia   . Erectile dysfunction   . History of asbestos exposure   . Nocturia   . History of shingles   . Dermatitis, atopic   . OA (osteoarthritis) of hip   . Arthritis   . Frequency of urination   . Lumbar scoliosis   . Malignant neoplasm of prostate ([place]) 06/07/2011  . 84 year old gentleman with stage T3 adenocarcinoma prostate with Gleason score 4+4 and PSA of 10.3 05/11/2011    Michele Judy PT, DPT 10/18/2017, 4:40 PM  [facility] [address] [place], [place], [address] Phone: [phone]   Fax:  [phone]  Name: Derek Carr MRN: [id number] Date of Birth: 1931/11/02

## 2017-10-18 NOTE — Therapy (Signed)
[facility] [address] [place], [place], [address] Phone: [phone]   Fax:  [phone]  Physical Therapy Treatment  Patient Details  Name: Derek Carr MRN: [id number] Date of Birth: 1931-08-18 Referring Provider: Mechele Claude, PA   Encounter Date: 10/17/2017  PT End of Session - 10/17/17 1152    Visit Number  13   recert done visit 13 so PN visit 23   Number of Visits  39    Date for PT Re-Evaluation  01/13/18    Authorization Type  [employer] Medicare & Generic commercial 2nd    Authorization Time Period  Progress Note due visit 23    PT Start Time  1100    PT Stop Time  1140    PT Time Calculation (min)  40 min    Equipment Utilized During Treatment  Gait belt    Activity Tolerance  Patient tolerated treatment well;No increased pain    Behavior During Therapy  WFL for tasks assessed/performed       Past Medical History:  Diagnosis Date  . AAA (abdominal aortic aneurysm) ([place])   . Anemia   . Ankle fracture    left  . Aortic regurgitation   . Atherosclerosis   . Complication of anesthesia    CONFUSION - Pt's family very concerned about this  . DDD (degenerative disc disease)   . Degenerative arthritis   . Dermatitis, atopic ARMS AND LEGS  . Diverticulosis   . Erectile dysfunction   . Frequency of urination   . Glaucoma   . History of asbestos exposure   . History of radiation therapy 8/19-13-10/18/11   prostate, 4 GY  . History of shingles 2012-- BILATERAL EYES--  NO RESIDUAL  . Hyperlipidemia   . Hypertension   . Inguinal hernia   . Lumbar scoliosis   . Nocturia   . OA (osteoarthritis) of hip RIGHT  . Prostate cancer ([place]) 05/11/2011   bx=Adenocarcinoma,gleason3+4=7, 4+4=8,PSA=10.30volume=45.7cc  . Renal cyst    bilateral  . Smokers' cough ([place])   . Stroke ([place])   . Thyroid cyst     Past Surgical History:  Procedure Laterality Date  . ATTEMPTED LEFT VATS/ LEFT THORACOTOMY/ RESECTION OF  THE ENORMOUS, PROBABLE BRONCHOGENIC CYST  01-04-2005  DR Arlyce Dice  . CARDIAC CATHETERIZATION  02-24-2009  DR NASHER   MINOR CORONARY ARTERY IRREGULARITIES/ NORMAL LVSF/ EF 65-70%  . CATARACT EXTRACTION    . COLONOSCOPY W/ POLYPECTOMY    . CYSTOSCOPY  11/15/2011   Procedure: CYSTOSCOPY;  Surgeon: Dutch Gray, MD;  [facility];  Service: Urology;  Laterality: N/A;  no seeds seen in bladder  . esophageus cyst removal  YRS AGO  . [place]  . ORIF ANKLE FRACTURE Left 05/03/2017  . ORIF ANKLE FRACTURE Left 05/03/2017   Procedure: OPEN REDUCTION INTERNAL FIXATION (ORIF) BIMALLEOLAR  ANKLE FRACTURE;  Surgeon: Wylene Simmer, MD;  Location: [facility];  Service: Orthopedics;  Laterality: Left;  . PACEMAKER IMPLANT N/A 09/16/2017   Procedure: PACEMAKER IMPLANT;  Surgeon: Constance Haw, MD;  Location: [facility];  Service: Cardiovascular;  Laterality: N/A;  . PROSTATE BIOPSY  05/11/11   Adenocarcinoma ([facility] OFFICE)  . RADIOACTIVE SEED IMPLANT  11/15/2011   Procedure: RADIOACTIVE SEED IMPLANT;  Surgeon: Dutch Gray, MD;  Location: [facility];  Service: Urology;  Laterality: N/A;  Total number of seeds - 52  . REPAIR RIGHT INGUINAL HERNIA W/ MESH  09-10-1999  .  TOTAL HIP ARTHROPLASTY Right 03/20/2013   Procedure: RIGHT TOTAL HIP ARTHROPLASTY ANTERIOR APPROACH;  Surgeon: [name], MD;  [facility];  Service: Orthopedics;  Laterality: Right;  . TOTAL HIP REVISION Right 05/14/2013   Procedure: open reduction internal fixation REVISION RIGHT HIP ;  Surgeon: [name], MD;  [facility];  Service: Orthopedics;  Laterality: Right;  . UMBILICAL HERNIA REPAIR  1998   epigastric    There were no vitals filed for this visit.  Subjective Assessment - 10/17/17 1154    Subjective  [name], MD referred to PT on 10/13/2017 to restart PT. He was hospitalized 09/14/2017-09/17/2017 after bradycardia & syncopal episode and had pacemaker placed  on 09/16/2017.Marland Kitchen Patient denies falls.     Patient is accompained by:  Family member    Pertinent History  left ankle fracture, TIAs, DDD, Lumbar scoliosis, Glaucoma, cataract surgery, HTN, right THR 03/20/13 with revision 05/14/13, right femur fracture, dementia/Alzheimers    Limitations  Standing;Walking;House hold activities    Patient Stated Goals  Patient wants to improve his balance & walking    Currently in Pain?  No/denies         [facility] PT Assessment - 10/17/17 1100      Ambulation/Gait   Ambulation/Gait  Yes    Ambulation/Gait Assistance  4: Min assist;5: Supervision   MinA cane & supervision RW   Ambulation Distance (Feet)  150 Feet   150' RW & 150' cane   Assistive device  Rolling walker;Straight cane   cane rubber quad tip   Gait Pattern  Step-through pattern;Decreased arm swing - left;Decreased step length - right;Decreased stance time - left;Decreased stride length;Decreased hip/knee flexion - left;Lateral hip instability;Wide base of support    Ambulation Surface  Indoor;Level    Gait velocity  2.01 ft/sec cane minA & 1.81 ft/sec RW    Stairs  Yes    Stairs Assistance  4: Min assist    Stair Management Technique  Two rails;Step to pattern;Forwards    Number of Stairs  4      Standardized Balance Assessment   Standardized Balance Assessment  Berg Balance Test;Dynamic Gait Index;Timed Up and Go Test      Berg Balance Test   Sit to Stand  Able to stand  independently using hands    Standing Unsupported  Able to stand 2 minutes with supervision    Sitting with Back Unsupported but Feet Supported on Floor or Stool  Able to sit safely and securely 2 minutes    Stand to Sit  Controls descent by using hands    Transfers  Able to transfer safely, definite need of hands    Standing Unsupported with Eyes Closed  Able to stand 3 seconds    Standing Ubsupported with Feet Together  Needs help to attain position but able to stand for 30 seconds with feet together    From Standing,  Reach Forward with Outstretched Arm  Reaches forward but needs supervision    From Standing Position, Pick up Object from Floor  Unable to pick up and needs supervision    From Standing Position, Turn to Look Behind Over each Shoulder  Needs supervision when turning    Turn 360 Degrees  Needs assistance while turning    Standing Unsupported, Alternately Place Feet on Step/Stool  Needs assistance to keep from falling or unable to try    Standing Unsupported, One Foot in [employer] balance while stepping or standing    Standing  on One Leg  Unable to try or needs assist to prevent fall    Total Score  22    Berg comment:  high fall risk & dependency with standing ADLs      Dynamic Gait Index   Level Surface  Moderate Impairment    Change in Gait Speed  Severe Impairment    Gait with Horizontal Head Turns  Moderate Impairment    Gait with Vertical Head Turns  Moderate Impairment    Gait and Pivot Turn  Severe Impairment    Step Over Obstacle  Severe Impairment    Step Around Obstacles  Moderate Impairment    Steps  Moderate Impairment    Total Score  5    DGI comment:  DGI 5/24 indicates fall risk;  DGI performed with cane      Timed Up and Go Test   Normal TUG (seconds)  18.54   cane with minA   TUG Comments  indicates high fall risk         Vestibular Assessment - 10/17/17 1100      Orthostatics   BP supine (x 5 minutes)  133/78   no dizziness or light headedness   HR supine (x 5 minutes)  63    BP standing (after 1 minute)  124/63   no dizziness or lightheadedness   HR standing (after 1 minute)  79    BP standing (after 3 minutes)  131/83    HR standing (after 3 minutes)  80    Orthostatics Comment  no symptoms                         PT Short Term Goals - 10/17/17 1824      PT SHORT TERM GOAL #1   Title  Pt will be independent with updated HEP exercises (All STGs Target Date 11/16/2017)    Baseline        Time  30    Period  Days    Status   On-going    Target Date  11/16/17      PT SHORT TERM GOAL #2   Title  Patient ambulates 250' paved surfaces with cane with supervision.     Baseline        Time  30    Period  Days    Status  On-going    Target Date  11/16/17      PT SHORT TERM GOAL #3   Title  Timed Up & Go with cane <16sec with supervision.     Baseline        Time  30    Period  Days    Status  On-going    Target Date  11/16/17      PT SHORT TERM GOAL #4   Title  Patient standing balance reaching 10" anteriorly, to floor and looks over shoulders without balance loss with supervision.     Baseline        Time  30    Period  Days    Status  On-going    Target Date  11/16/17      PT SHORT TERM GOAL #5   Title  Patient negotiates ramps & curbs with cane with min guard.     Baseline        Time  30    Period  Days    Status  New    Target Date  11/16/17  PT Long Term Goals - 10/17/17 1822      PT LONG TERM GOAL #1   Title  Pt will demonstrate independence with ongoing HEP program and fitness plan (All LTGs Target Date 01/13/2018)    Time  90    Period  Days    Status  On-going    Target Date  01/13/18      PT LONG TERM GOAL #2   Title  Patient ambulates Modified Independent 500' outdoors including grass, pavement, curbs, ramps with cane to improve community ambulatory skills    Time  90    Period  Days    Status  On-going    Target Date  01/13/18      PT LONG TERM GOAL #3   Title  Berg Balance >/= 45/56 to indicate lower fall risk.     Time  90    Period  Days    Status  On-going    Target Date  01/13/18      PT LONG TERM GOAL #4   Title  Timed Up-Go time with cane <13.5 seconds to indicate lower fall risk.     Time  90    Period  Days    Status  On-going    Target Date  01/13/18      PT LONG TERM GOAL #5   Title  Dynamic Gait Index with cane >12/24 to indicate lower fall risk.     Time  90    Period  Days    Status  New    Target Date  01/13/18            Plan -  10/17/17 1813    Clinical Impression Statement  PT performed reevaluation today as patient was hospitalized and had pacemaker placed. Patient reported still having issues with low BP at times. PT assessed for orthostatic Hypotension & patient was positive with >37TK diastolic change but he was assymptomatic with standing up.  Patient continues to test at high fall risk as noted by [facility] 22/56 and Timed Up-Go with cane 18.54sec with minA.  He also test at fall risk with gait activities requiring supervision with rolling walker & minimal assist with cane. Dynamic Gait Index with cane 5/24 and gait velocity with cane 2.01 ft/sec and 1.81 ft/sec.     Rehab Potential  Good    PT Frequency  2x / week    PT Duration  12 weeks    PT Treatment/Interventions  Gait training;Functional mobility training;Neuromuscular re-education;Balance training;Therapeutic exercise;Therapeutic activities;Patient/family education;ADLs/Self Care Home Management;Stair training;Orthotic Fit/Training;Manual techniques;Vestibular;Canalith Repostioning    PT Next Visit Plan  balance activities and gait with cane.     Consulted and Agree with Plan of Care  Patient       Patient will benefit from skilled therapeutic intervention in order to improve the following deficits and impairments:  Abnormal gait, Decreased activity tolerance, Decreased balance, Decreased endurance, Decreased knowledge of use of DME, Decreased mobility, Decreased range of motion, Decreased safety awareness, Decreased strength, Impaired flexibility, Postural dysfunction, Dizziness  Visit Diagnosis: Muscle weakness (generalized)  Unsteadiness on feet  Other abnormalities of gait and mobility  Dizziness and giddiness  Other lack of coordination  Stiffness of left ankle, not elsewhere classified     Problem List Patient Active Problem List   Diagnosis Date Noted  . Bradycardia 09/16/2017  . CKD (chronic kidney disease), stage III ([place])  09/15/2017  . Near syncope 09/14/2017  . Post-operative pain   . Dementia  without behavioral disturbance   . TIA (transient ischemic attack)   . Reactive hypertension   . Bimalleolar fracture of left ankle 05/03/2017  . Axonal neuropathy 03/21/2017  . Right sided weakness 03/01/2017  . Cigarette smoker 06/16/2016  . Dyspnea on exertion 06/15/2016  . Alzheimer disease 01/07/2014  . CSA (central sleep apnea) 10/05/2013  . Syncope 06/26/2013  . COPD GOLD II 06/25/2013  . BPH (benign prostatic hyperplasia) 05/18/2013  . Glaucoma 05/18/2013  . S/P right TH revision 05/14/2013  . Constipation 03/26/2013  . Osteoporosis 03/26/2013  . S/P right THA, AA 03/20/2013  . Hyperlipidemia   . Erectile dysfunction   . History of asbestos exposure   . Nocturia   . History of shingles   . Dermatitis, atopic   . OA (osteoarthritis) of hip   . Arthritis   . Frequency of urination   . Lumbar scoliosis   . Malignant neoplasm of prostate ([place]) 06/07/2011  . 84 year old gentleman with stage T3 adenocarcinoma prostate with Gleason score 4+4 and PSA of 10.3 05/11/2011    Nyaja Dubuque PT, DPT 10/18/2017, 8:28 AM  [facility] [address] [place], [place], [address] Phone: [phone]   Fax:  [phone]  Name: RONEY YOUTZ MRN: [id number] Date of Birth: 12-Apr-1931

## 2017-10-19 ENCOUNTER — Ambulatory Visit: Payer: Medicare HMO | Admitting: Physical Therapy

## 2017-10-20 ENCOUNTER — Ambulatory Visit: Payer: Medicare HMO | Admitting: Physical Therapy

## 2017-10-20 NOTE — Telephone Encounter (Signed)
Pt's daughter Robin/DPR, said the pt felt like "Superman" the next morning after the sleep study with the machine. He does not feel as great, not like "Superman" as he did during the sleep study with this machine and is wondering if the settings should be changed. She would also like to know his progress over the past 2 days.

## 2017-10-20 NOTE — Telephone Encounter (Signed)
Called the patient's daughter and there was no answer. LVM informing her that I would reach out to [employer]. I don't have access to see his information in order to help see if there was a issue with machine. Encouraged the patient to reach out to [employer] and have them look at his data and have them recommend advice if anything else is needed to change for the patient.

## 2017-10-24 ENCOUNTER — Ambulatory Visit: Payer: Medicare HMO | Admitting: Physical Therapy

## 2017-10-25 ENCOUNTER — Ambulatory Visit: Payer: Medicare HMO | Attending: Internal Medicine

## 2017-10-25 DIAGNOSIS — M25672 Stiffness of left ankle, not elsewhere classified: Secondary | ICD-10-CM | POA: Diagnosis not present

## 2017-10-25 DIAGNOSIS — I10 Essential (primary) hypertension: Secondary | ICD-10-CM | POA: Diagnosis not present

## 2017-10-25 DIAGNOSIS — R278 Other lack of coordination: Secondary | ICD-10-CM

## 2017-10-25 DIAGNOSIS — R2681 Unsteadiness on feet: Secondary | ICD-10-CM | POA: Diagnosis not present

## 2017-10-25 DIAGNOSIS — R2689 Other abnormalities of gait and mobility: Secondary | ICD-10-CM

## 2017-10-25 DIAGNOSIS — M6281 Muscle weakness (generalized): Secondary | ICD-10-CM

## 2017-10-25 DIAGNOSIS — R42 Dizziness and giddiness: Secondary | ICD-10-CM

## 2017-10-25 DIAGNOSIS — Z23 Encounter for immunization: Secondary | ICD-10-CM | POA: Diagnosis not present

## 2017-10-25 NOTE — Therapy (Signed)
[facility] [address] [place], [place], [address] Phone: [phone]   Fax:  [phone]  Physical Therapy Treatment  Patient Details  Name: Derek Carr MRN: [id number] Date of Birth: [birth year] Referring Provider (PT): Mechele Claude, [place]   Encounter Date: 10/25/2017  PT End of Session - 10/25/17 1102    Visit Number  15   recert done visit 13 so PN visit 23   Number of Visits  39    Date for PT Re-Evaluation  01/13/18    Authorization Type  [employer] Medicare & Generic commercial 2nd    Authorization Time Period  Progress Note due visit 23    PT Start Time  1100    PT Stop Time  1145    PT Time Calculation (min)  45 min    Equipment Utilized During Treatment  Gait belt    Activity Tolerance  Patient tolerated treatment well;No increased pain    Behavior During Therapy  WFL for tasks assessed/performed       Past Medical History:  Diagnosis Date  . AAA (abdominal aortic aneurysm) ([place])   . Anemia   . Ankle fracture    left  . Aortic regurgitation   . Atherosclerosis   . Complication of anesthesia    CONFUSION - Pt's family very concerned about this  . DDD (degenerative disc disease)   . Degenerative arthritis   . Dermatitis, atopic ARMS AND LEGS  . Diverticulosis   . Erectile dysfunction   . Frequency of urination   . Glaucoma   . History of asbestos exposure   . History of radiation therapy 8/19-13-10/18/11   prostate, 42 GY  . History of shingles 2012-- BILATERAL EYES--  NO RESIDUAL  . Hyperlipidemia   . Hypertension   . Inguinal hernia   . Lumbar scoliosis   . Nocturia   . OA (osteoarthritis) of hip RIGHT  . Prostate cancer ([place]) 05/11/2011   bx=Adenocarcinoma,gleason3+4=7, 4+4=8,PSA=10.30volume=45.7cc  . Renal cyst    bilateral  . Smokers' cough ([place])   . Stroke ([place])   . Thyroid cyst     Past Surgical History:  Procedure Laterality Date  . ATTEMPTED LEFT VATS/ LEFT THORACOTOMY/  RESECTION OF THE ENORMOUS, PROBABLE BRONCHOGENIC CYST  01-04-2005  DR Arlyce Dice  . CARDIAC CATHETERIZATION  02-24-2009  DR NASHER   MINOR CORONARY ARTERY IRREGULARITIES/ NORMAL LVSF/ EF 65-70%  . CATARACT EXTRACTION    . COLONOSCOPY W/ POLYPECTOMY    . CYSTOSCOPY  11/15/2011   Procedure: CYSTOSCOPY;  Surgeon: Dutch Gray, MD;  Location[facility];  Service: Urology;  Laterality: N/A;  no seeds seen in bladder  . esophageus cyst removal  YRS AGO  . [place]  . ORIF ANKLE FRACTURE Left 05/03/2017  . ORIF ANKLE FRACTURE Left 05/03/2017   Procedure: OPEN REDUCTION INTERNAL FIXATION (ORIF) BIMALLEOLAR  ANKLE FRACTURE;  Surgeon: Wylene Simmer, MD;  Location: [facility];  Service: Orthopedics;  Laterality: Left;  . PACEMAKER IMPLANT N/A 09/16/2017   Procedure: PACEMAKER IMPLANT;  Surgeon: Constance Haw, MD;  Location: [facility];  Service: Cardiovascular;  Laterality: N/A;  . PROSTATE BIOPSY  05/11/11   Adenocarcinoma (MD OFFICE)  . RADIOACTIVE SEED IMPLANT  11/15/2011   Procedure: RADIOACTIVE SEED IMPLANT;  Surgeon: Dutch Gray, MD;  Location: [facility];  Service: Urology;  Laterality: N/A;  Total number of seeds - 52  . REPAIR RIGHT INGUINAL HERNIA W/ MESH  09-10-1999  .  TOTAL HIP ARTHROPLASTY Right 03/20/2013   Procedure: RIGHT TOTAL HIP ARTHROPLASTY ANTERIOR APPROACH;  Surgeon: [name], MD;  [facility];  Service: Orthopedics;  Laterality: Right;  . TOTAL HIP REVISION Right 05/14/2013   Procedure: open reduction internal fixation REVISION RIGHT HIP ;  Surgeon: [name], MD;  [facility];  Service: Orthopedics;  Laterality: Right;  . UMBILICAL HERNIA REPAIR  1998   epigastric    There were no vitals filed for this visit.  Subjective Assessment - 10/25/17 1101    Subjective  No dizziness today, no falls to report.     Patient is accompained by:  Family member    Pertinent History  left ankle fracture, TIAs, DDD,  Lumbar scoliosis, Glaucoma, cataract surgery, HTN, right THR 03/20/13 with revision 05/14/13, right femur fracture, dementia/Alzheimers    Limitations  Standing;Walking;House hold activities    Patient Stated Goals  Patient wants to improve his balance & walking    Currently in Pain?  No/denies        [facility] Adult PT Treatment/Exercise - 10/25/17 1103      Ambulation/Gait   Ambulation/Gait  Yes    Ambulation/Gait Assistance  5: Supervision;4: Min guard    Ambulation Distance (Feet)  215 Feet    Assistive device  Straight cane;Other (Comment)   quad tipped cane   Gait Pattern  Step-through pattern;Decreased stance time - left;Decreased weight shift to left;Lateral hip instability;Wide base of support;Decreased stride length;Decreased step length - left;Poor foot clearance - right    Ambulation Surface  Level;Indoor    Ramp  4: Min assist;Other (comment)   quad tipped cane   Ramp Details (indicate cue type and reason)  VC's for weightshift and posture.     Curb  4: Min assist;Other (comment)   Quad tipped cane   Curb Details (indicate cue type and reason)  VC's for sequence, toe/heel clearance and technique.       High Level Balance   High Level Balance Activities  Side stepping;Backward walking;Tandem walking;Marching forwards;Marching backwards;Negotitating around obstacles;Negotiating over obstacles;Other (comment)   side step over cones, toe taps to cones   High Level Balance Comments  In parallel bars on red mat with BUE support progressing to SUE support, x5 laps each activity. VC's for posture, proper technique and forward gaze.         PT Short Term Goals - 10/17/17 1824      PT SHORT TERM GOAL #1   Title  Pt will be independent with updated HEP exercises (All STGs Target Date 11/16/2017)    Baseline        Time  30    Period  Days    Status  On-going    Target Date  11/16/17      PT SHORT TERM GOAL #2   Title  Patient ambulates 250' paved surfaces with cane with  supervision.     Baseline        Time  30    Period  Days    Status  On-going    Target Date  11/16/17      PT SHORT TERM GOAL #3   Title  Timed Up & Go with cane <16sec with supervision.     Baseline        Time  30    Period  Days    Status  On-going    Target Date  11/16/17      PT SHORT TERM GOAL #4  Title  Patient standing balance reaching 10" anteriorly, to floor and looks over shoulders without balance loss with supervision.     Baseline        Time  30    Period  Days    Status  On-going    Target Date  11/16/17      PT SHORT TERM GOAL #5   Title  Patient negotiates ramps & curbs with cane with min guard.     Baseline        Time  30    Period  Days    Status  New    Target Date  11/16/17        PT Long Term Goals - 10/17/17 1822      PT LONG TERM GOAL #1   Title  Pt will demonstrate independence with ongoing HEP program and fitness plan (All LTGs Target Date 01/13/2018)    Time  90    Period  Days    Status  On-going    Target Date  01/13/18      PT LONG TERM GOAL #2   Title  Patient ambulates Modified Independent 500' outdoors including grass, pavement, curbs, ramps with cane to improve community ambulatory skills    Time  90    Period  Days    Status  On-going    Target Date  01/13/18      PT LONG TERM GOAL #3   Title  Berg Balance >/= 45/56 to indicate lower fall risk.     Time  90    Period  Days    Status  On-going    Target Date  01/13/18      PT LONG TERM GOAL #4   Title  Timed Up-Go time with cane <13.5 seconds to indicate lower fall risk.     Time  90    Period  Days    Status  On-going    Target Date  01/13/18      PT LONG TERM GOAL #5   Title  Dynamic Gait Index with cane >12/24 to indicate lower fall risk.     Time  90    Period  Days    Status  New    Target Date  01/13/18         Plan - 10/25/17 1157    Clinical Impression Statement  Todays skilled session focused on gait/ramp/curb training with quad tipped cane and  high level balance on compliant surfaces with decreased UE support to improve stability during gait and tranfers. Pt should benefit from continued PT sessions to progress toward goals.     Rehab Potential  Good    PT Frequency  2x / week    PT Duration  12 weeks    PT Treatment/Interventions  Gait training;Functional mobility training;Neuromuscular re-education;Balance training;Therapeutic exercise;Therapeutic activities;Patient/family education;ADLs/Self Care Home Management;Stair training;Orthotic Fit/Training;Manual techniques;Vestibular;Canalith Repostioning    PT Next Visit Plan  update HEP with stretches & standing balance near support.  balance activities and gait with cane.    Consulted and Agree with Plan of Care  Patient       Patient will benefit from skilled therapeutic intervention in order to improve the following deficits and impairments:  Abnormal gait, Decreased activity tolerance, Decreased balance, Decreased endurance, Decreased knowledge of use of DME, Decreased mobility, Decreased range of motion, Decreased safety awareness, Decreased strength, Impaired flexibility, Postural dysfunction, Dizziness  Visit Diagnosis: Muscle weakness (generalized)  Unsteadiness on feet  Other abnormalities of  gait and mobility  Dizziness and giddiness  Other lack of coordination  Stiffness of left ankle, not elsewhere classified     Problem List Patient Active Problem List   Diagnosis Date Noted  . Bradycardia 09/16/2017  . CKD (chronic kidney disease), stage III ([place]) 09/15/2017  . Near syncope 09/14/2017  . Post-operative pain   . Dementia without behavioral disturbance ([place])   . TIA (transient ischemic attack)   . Reactive hypertension   . Bimalleolar fracture of left ankle 05/03/2017  . Axonal neuropathy 03/21/2017  . Right sided weakness 03/01/2017  . Cigarette smoker 06/16/2016  . Dyspnea on exertion 06/15/2016  . Alzheimer disease ([place]) 01/07/2014  . CSA (central  sleep apnea) 10/05/2013  . Syncope 06/26/2013  . COPD GOLD II 06/25/2013  . BPH (benign prostatic hyperplasia) 05/18/2013  . Glaucoma 05/18/2013  . S/P right TH revision 05/14/2013  . Constipation 03/26/2013  . Osteoporosis 03/26/2013  . S/P right THA, AA 03/20/2013  . Hyperlipidemia   . Erectile dysfunction   . History of asbestos exposure   . Nocturia   . History of shingles   . Dermatitis, atopic   . OA (osteoarthritis) of hip   . Arthritis   . Frequency of urination   . Lumbar scoliosis   . Malignant neoplasm of prostate ([place]) 06/07/2011  . 84 year old gentleman with stage T3 adenocarcinoma prostate with Gleason score 4+4 and PSA of 10.3 05/11/2011   Kenijah Benningfield, PTA  Jaisen Wiltrout A Yaqueline Gutter 10/25/2017, 12:01 PM  [facility] [address] [place], [place], [address] Phone: [phone]   Fax:  [phone]  Name: YAMATO KOPF MRN: [id number] Date of Birth: [birth year]

## 2017-10-26 ENCOUNTER — Ambulatory Visit: Payer: Medicare HMO | Admitting: Physical Therapy

## 2017-10-27 ENCOUNTER — Ambulatory Visit: Payer: Medicare HMO | Admitting: Physical Therapy

## 2017-10-28 ENCOUNTER — Ambulatory Visit: Payer: Medicare HMO

## 2017-10-28 DIAGNOSIS — R42 Dizziness and giddiness: Secondary | ICD-10-CM

## 2017-10-28 DIAGNOSIS — R278 Other lack of coordination: Secondary | ICD-10-CM | POA: Diagnosis not present

## 2017-10-28 DIAGNOSIS — M6281 Muscle weakness (generalized): Secondary | ICD-10-CM

## 2017-10-28 DIAGNOSIS — M25672 Stiffness of left ankle, not elsewhere classified: Secondary | ICD-10-CM | POA: Diagnosis not present

## 2017-10-28 DIAGNOSIS — R2689 Other abnormalities of gait and mobility: Secondary | ICD-10-CM

## 2017-10-28 DIAGNOSIS — R2681 Unsteadiness on feet: Secondary | ICD-10-CM

## 2017-10-28 NOTE — Therapy (Signed)
[facility] [address] [place], [place], [address] Phone: [phone]   Fax:  [phone]  Physical Therapy Treatment  Patient Details  Name: Derek Carr MRN: [id number] Date of Birth: [birth year] Referring Provider (PT): Mechele Claude, [place]   Encounter Date: 10/28/2017  PT End of Session - 10/28/17 1106    Visit Number  16   recert done visit 13 so PN visit 23   Number of Visits  39    Date for PT Re-Evaluation  01/13/18    Authorization Type  [employer] Medicare & Generic commercial 2nd    Authorization Time Period  Progress Note due visit 23    PT Start Time  1102    PT Stop Time  1146    PT Time Calculation (min)  44 min    Equipment Utilized During Treatment  Gait belt    Activity Tolerance  Patient tolerated treatment well;No increased pain    Behavior During Therapy  WFL for tasks assessed/performed       Past Medical History:  Diagnosis Date  . AAA (abdominal aortic aneurysm) ([place])   . Anemia   . Ankle fracture    left  . Aortic regurgitation   . Atherosclerosis   . Complication of anesthesia    CONFUSION - Pt's family very concerned about this  . DDD (degenerative disc disease)   . Degenerative arthritis   . Dermatitis, atopic ARMS AND LEGS  . Diverticulosis   . Erectile dysfunction   . Frequency of urination   . Glaucoma   . History of asbestos exposure   . History of radiation therapy 8/19-13-10/18/11   prostate, 110 GY  . History of shingles 2012-- BILATERAL EYES--  NO RESIDUAL  . Hyperlipidemia   . Hypertension   . Inguinal hernia   . Lumbar scoliosis   . Nocturia   . OA (osteoarthritis) of hip RIGHT  . Prostate cancer ([place]) 05/11/2011   bx=Adenocarcinoma,gleason3+4=7, 4+4=8,PSA=10.30volume=45.7cc  . Renal cyst    bilateral  . Smokers' cough ([place])   . Stroke ([place])   . Thyroid cyst     Past Surgical History:  Procedure Laterality Date  . ATTEMPTED LEFT VATS/ LEFT THORACOTOMY/  RESECTION OF THE ENORMOUS, PROBABLE BRONCHOGENIC CYST  01-04-2005  DR Arlyce Dice  . CARDIAC CATHETERIZATION  02-24-2009  DR NASHER   MINOR CORONARY ARTERY IRREGULARITIES/ NORMAL LVSF/ EF 65-70%  . CATARACT EXTRACTION    . COLONOSCOPY W/ POLYPECTOMY    . CYSTOSCOPY  11/15/2011   Procedure: CYSTOSCOPY;  Surgeon: Dutch Gray, MD;  [facility];  Service: Urology;  Laterality: N/A;  no seeds seen in bladder  . esophageus cyst removal  YRS AGO  . [place]  . ORIF ANKLE FRACTURE Left 05/03/2017  . ORIF ANKLE FRACTURE Left 05/03/2017   Procedure: OPEN REDUCTION INTERNAL FIXATION (ORIF) BIMALLEOLAR  ANKLE FRACTURE;  Surgeon: Wylene Simmer, MD;  Location: [facility];  Service: Orthopedics;  Laterality: Left;  . PACEMAKER IMPLANT N/A 09/16/2017   Procedure: PACEMAKER IMPLANT;  Surgeon: Constance Haw, MD;  Location: [facility];  Service: Cardiovascular;  Laterality: N/A;  . PROSTATE BIOPSY  05/11/11   Adenocarcinoma ([facility] OFFICE)  . RADIOACTIVE SEED IMPLANT  11/15/2011   Procedure: RADIOACTIVE SEED IMPLANT;  Surgeon: Dutch Gray, MD;  Location[facility];  Service: Urology;  Laterality: N/A;  Total number of seeds - 52  . REPAIR RIGHT INGUINAL HERNIA W/ MESH  09-10-1999  .  TOTAL HIP ARTHROPLASTY Right 03/20/2013   Procedure: RIGHT TOTAL HIP ARTHROPLASTY ANTERIOR APPROACH;  Surgeon: [name], MD;  [facility];  Service: Orthopedics;  Laterality: Right;  . TOTAL HIP REVISION Right 05/14/2013   Procedure: open reduction internal fixation REVISION RIGHT HIP ;  Surgeon: [name], MD;  [facility];  Service: Orthopedics;  Laterality: Right;  . UMBILICAL HERNIA REPAIR  1998   epigastric    There were no vitals filed for this visit.  Subjective Assessment - 10/28/17 1105    Subjective  No falls to report, "just a little" dizziness today.     Patient is accompained by:  Family member    Pertinent History  left ankle fracture,  TIAs, DDD, Lumbar scoliosis, Glaucoma, cataract surgery, HTN, right THR 03/20/13 with revision 05/14/13, right femur fracture, dementia/Alzheimers    Limitations  Standing;Walking;House hold activities    Patient Stated Goals  Patient wants to improve his balance & walking    Currently in Pain?  No/denies       [facility] Adult PT Treatment/Exercise - 10/28/17 1106      Ambulation/Gait   Ambulation/Gait  Yes    Ambulation/Gait Assistance  5: Supervision;4: Min guard    Ambulation/Gait Assistance Details  while scanning hallway, head nods/turns/change in speed, min guard/min assist with some LOB noted with pt able to correct. VC's for pacing, forward gaze, and staying on path.     Ambulation Distance (Feet)  450 Feet    Assistive device  Straight cane;Other (Comment)   quad tipped cane   Gait Pattern  Step-through pattern;Decreased stance time - left;Decreased weight shift to left;Lateral hip instability;Wide base of support;Decreased stride length;Decreased step length - left;Poor foot clearance - right    Ambulation Surface  Level;Indoor      Neuro Re-ed    Neuro Re-ed Details   Pt in parallel bars on rockerboard, L/R,fwd/back weight shift, progressing to no UE support in static standing, strong posterior lean with tactile/verbal feedback from therapist required for correction.       Exercises   Exercises  Knee/Hip    Other Exercises   --      Knee/Hip Exercises: Aerobic   Nustep  L2 104mins, 511 steps, VC's for set up.        PT Short Term Goals - 10/17/17 1824      PT SHORT TERM GOAL #1   Title  Pt will be independent with updated HEP exercises (All STGs Target Date 11/16/2017)    Baseline        Time  30    Period  Days    Status  On-going    Target Date  11/16/17      PT SHORT TERM GOAL #2   Title  Patient ambulates 250' paved surfaces with cane with supervision.     Baseline        Time  30    Period  Days    Status  On-going    Target Date  11/16/17      PT SHORT TERM  GOAL #3   Title  Timed Up & Go with cane <16sec with supervision.     Baseline        Time  30    Period  Days    Status  On-going    Target Date  11/16/17      PT SHORT TERM GOAL #4   Title  Patient standing balance reaching 10" anteriorly, to floor and  looks over shoulders without balance loss with supervision.     Baseline        Time  30    Period  Days    Status  On-going    Target Date  11/16/17      PT SHORT TERM GOAL #5   Title  Patient negotiates ramps & curbs with cane with min guard.     Baseline        Time  30    Period  Days    Status  New    Target Date  11/16/17        PT Long Term Goals - 10/17/17 1822      PT LONG TERM GOAL #1   Title  Pt will demonstrate independence with ongoing HEP program and fitness plan (All LTGs Target Date 01/13/2018)    Time  90    Period  Days    Status  On-going    Target Date  01/13/18      PT LONG TERM GOAL #2   Title  Patient ambulates Modified Independent 500' outdoors including grass, pavement, curbs, ramps with cane to improve community ambulatory skills    Time  90    Period  Days    Status  On-going    Target Date  01/13/18      PT LONG TERM GOAL #3   Title  Berg Balance >/= 45/56 to indicate lower fall risk.     Time  90    Period  Days    Status  On-going    Target Date  01/13/18      PT LONG TERM GOAL #4   Title  Timed Up-Go time with cane <13.5 seconds to indicate lower fall risk.     Time  90    Period  Days    Status  On-going    Target Date  01/13/18      PT LONG TERM GOAL #5   Title  Dynamic Gait Index with cane >12/24 to indicate lower fall risk.     Time  90    Period  Days    Status  New    Target Date  01/13/18        Plan - 10/28/17 1331    Clinical Impression Statement  Todays skilled session focused on gait training while scanning, LE strengthening and high level balance activities while reducing UE support. Pt should benefit from continued PT session to progress towards unment  goals.     Rehab Potential  Good    PT Frequency  2x / week    PT Duration  12 weeks    PT Treatment/Interventions  Gait training;Functional mobility training;Neuromuscular re-education;Balance training;Therapeutic exercise;Therapeutic activities;Patient/family education;ADLs/Self Care Home Management;Stair training;Orthotic Fit/Training;Manual techniques;Vestibular;Canalith Repostioning    PT Next Visit Plan  update HEP with stretches & standing balance near support.  Rockerboard with head movements, EO/EC, and gait with cane while neg obstacles.     Consulted and Agree with Plan of Care  Patient       Patient will benefit from skilled therapeutic intervention in order to improve the following deficits and impairments:  Abnormal gait, Decreased activity tolerance, Decreased balance, Decreased endurance, Decreased knowledge of use of DME, Decreased mobility, Decreased range of motion, Decreased safety awareness, Decreased strength, Impaired flexibility, Postural dysfunction, Dizziness  Visit Diagnosis: Muscle weakness (generalized)  Unsteadiness on feet  Other abnormalities of gait and mobility  Dizziness and giddiness  Other lack of coordination  Problem List Patient Active Problem List   Diagnosis Date Noted  . Bradycardia 09/16/2017  . CKD (chronic kidney disease), stage III ([place]) 09/15/2017  . Near syncope 09/14/2017  . Post-operative pain   . Dementia without behavioral disturbance ([place])   . TIA (transient ischemic attack)   . Reactive hypertension   . Bimalleolar fracture of left ankle 05/03/2017  . Axonal neuropathy 03/21/2017  . Right sided weakness 03/01/2017  . Cigarette smoker 06/16/2016  . Dyspnea on exertion 06/15/2016  . Alzheimer disease ([place]) 01/07/2014  . CSA (central sleep apnea) 10/05/2013  . Syncope 06/26/2013  . COPD GOLD II 06/25/2013  . BPH (benign prostatic hyperplasia) 05/18/2013  . Glaucoma 05/18/2013  . S/P right TH revision 05/14/2013  .  Constipation 03/26/2013  . Osteoporosis 03/26/2013  . S/P right THA, AA 03/20/2013  . Hyperlipidemia   . Erectile dysfunction   . History of asbestos exposure   . Nocturia   . History of shingles   . Dermatitis, atopic   . OA (osteoarthritis) of hip   . Arthritis   . Frequency of urination   . Lumbar scoliosis   . Malignant neoplasm of prostate ([place]) 06/07/2011  . 84 year old gentleman with stage T3 adenocarcinoma prostate with Gleason score 4+4 and PSA of 10.3 05/11/2011   Lameshia Hypolite, PTA  Yuri Fana A Johnanna Bakke 10/28/2017, 1:36 PM  [facility] [address] [place], [place], [address] Phone: [phone]   Fax:  [phone]  Name: Derek Carr MRN: [id number] Date of Birth: 08-24-1931

## 2017-10-31 ENCOUNTER — Ambulatory Visit: Payer: Medicare HMO | Admitting: Physical Therapy

## 2017-11-01 ENCOUNTER — Ambulatory Visit: Payer: Medicare HMO

## 2017-11-01 DIAGNOSIS — R2681 Unsteadiness on feet: Secondary | ICD-10-CM | POA: Diagnosis not present

## 2017-11-01 DIAGNOSIS — R2689 Other abnormalities of gait and mobility: Secondary | ICD-10-CM

## 2017-11-01 DIAGNOSIS — M25672 Stiffness of left ankle, not elsewhere classified: Secondary | ICD-10-CM | POA: Diagnosis not present

## 2017-11-01 DIAGNOSIS — R42 Dizziness and giddiness: Secondary | ICD-10-CM | POA: Diagnosis not present

## 2017-11-01 DIAGNOSIS — R278 Other lack of coordination: Secondary | ICD-10-CM | POA: Diagnosis not present

## 2017-11-01 DIAGNOSIS — M6281 Muscle weakness (generalized): Secondary | ICD-10-CM | POA: Diagnosis not present

## 2017-11-01 NOTE — Therapy (Signed)
[facility] [address] [place], [place], [address] Phone: [phone]   Fax:  [phone]  Physical Therapy Treatment  Patient Details  Name: Derek Carr MRN: [id number] Date of Birth: 07-09-1931 Referring Provider (PT): Derek Carr, [place]   Encounter Date: 11/01/2017  PT End of Session - 11/01/17 1023    Visit Number  17   recert done visit 13 so PN visit 23   Number of Visits  39    Date for PT Re-Evaluation  01/13/18    Authorization Type  [employer] Medicare & Generic commercial 2nd    Authorization Time Period  Progress Note due visit 23    PT Start Time  1020    PT Stop Time  1102    PT Time Calculation (min)  42 min    Equipment Utilized During Treatment  Gait belt    Activity Tolerance  Patient tolerated treatment well;No increased pain    Behavior During Therapy  WFL for tasks assessed/performed       Past Medical History:  Diagnosis Date  . AAA (abdominal aortic aneurysm) ([place])   . Anemia   . Ankle fracture    left  . Aortic regurgitation   . Atherosclerosis   . Complication of anesthesia    CONFUSION - Pt's family very concerned about this  . DDD (degenerative disc disease)   . Degenerative arthritis   . Dermatitis, atopic ARMS AND LEGS  . Diverticulosis   . Erectile dysfunction   . Frequency of urination   . Glaucoma   . History of asbestos exposure   . History of radiation therapy 8/19-13-10/18/11   prostate, 40 GY  . History of shingles 2012-- BILATERAL EYES--  NO RESIDUAL  . Hyperlipidemia   . Hypertension   . Inguinal hernia   . Lumbar scoliosis   . Nocturia   . OA (osteoarthritis) of hip RIGHT  . Prostate cancer ([place]) 05/11/2011   bx=Adenocarcinoma,gleason3+4=7, 4+4=8,PSA=10.30volume=45.7cc  . Renal cyst    bilateral  . Smokers' cough ([place])   . Stroke ([place])   . Thyroid cyst     Past Surgical History:  Procedure Laterality Date  . ATTEMPTED LEFT VATS/ LEFT THORACOTOMY/  RESECTION OF THE ENORMOUS, PROBABLE BRONCHOGENIC CYST  01-04-2005  Derek Carr  . CARDIAC CATHETERIZATION  02-24-2009  Derek Derek Carr   MINOR CORONARY ARTERY IRREGULARITIES/ NORMAL LVSF/ EF 65-70%  . CATARACT EXTRACTION    . COLONOSCOPY W/ POLYPECTOMY    . CYSTOSCOPY  11/15/2011   Procedure: CYSTOSCOPY;  Surgeon: Derek Gray, MD;  Location: [facility];  Service: Urology;  Laterality: N/A;  no seeds seen in bladder  . esophageus cyst removal  YRS AGO  . [place]  . ORIF ANKLE FRACTURE Left 05/03/2017  . ORIF ANKLE FRACTURE Left 05/03/2017   Procedure: OPEN REDUCTION INTERNAL FIXATION (ORIF) BIMALLEOLAR  ANKLE FRACTURE;  Surgeon: Derek Simmer, MD;  Location: [facility];  Service: Orthopedics;  Laterality: Left;  . PACEMAKER IMPLANT N/A 09/16/2017   Procedure: PACEMAKER IMPLANT;  Surgeon: Derek Haw, MD;  Location: [facility];  Service: Cardiovascular;  Laterality: N/A;  . PROSTATE BIOPSY  05/11/11   Adenocarcinoma (MD OFFICE)  . RADIOACTIVE SEED IMPLANT  11/15/2011   Procedure: RADIOACTIVE SEED IMPLANT;  Surgeon: Derek Gray, MD;  Location: [facility];  Service: Urology;  Laterality: N/A;  Total number of seeds - 52  . REPAIR RIGHT INGUINAL HERNIA W/ MESH  09-10-1999  .  TOTAL HIP ARTHROPLASTY Right 03/20/2013   Procedure: RIGHT TOTAL HIP ARTHROPLASTY ANTERIOR APPROACH;  Surgeon: Derek Pole, MD;  [facility];  Service: Orthopedics;  Laterality: Right;  . TOTAL HIP REVISION Right 05/14/2013   Procedure: open reduction internal fixation REVISION RIGHT HIP ;  Surgeon: Derek Pole, MD;  Location: [facility];  Service: Orthopedics;  Laterality: Right;  . UMBILICAL HERNIA REPAIR  1998   epigastric    There were no vitals filed for this visit.  Subjective Assessment - 11/01/17 1022    Subjective  No falls to report, just a little dizzy sometimes, not constant, none today but some days are better than others.     Patient is accompained by:   Family member   Daughter   Pertinent History  left ankle fracture, TIAs, DDD, Lumbar scoliosis, Glaucoma, cataract surgery, HTN, right THR 03/20/13 with revision 05/14/13, right femur fracture, dementia/Alzheimers    Limitations  Standing;Walking;House hold activities    Patient Stated Goals  Patient wants to improve his balance & walking    Currently in Pain?  No/denies        [facility] Adult PT Treatment/Exercise - 11/01/17 1024      Ambulation/Gait   Ambulation/Gait  Yes    Ambulation/Gait Assistance  5: Supervision    Ambulation/Gait Assistance Details  While negotiating obstacles with one episode of LOB noted while transitioning from tile to carpet.     Ambulation Distance (Feet)  115 Feet    Assistive device  Straight cane;Other (Comment)   quad tipped cane   Gait Pattern  Step-through pattern;Decreased stance time - left;Decreased weight shift to left;Lateral hip instability;Wide base of support;Decreased stride length;Decreased step length - left;Poor foot clearance - right    Ambulation Surface  Level;Indoor    Ramp  4: Min assist;Other (comment)   min guard, quad tipped cane   Ramp Details (indicate cue type and reason)  VC's for weight shift onto toes/heels while demonstrating upright posture.     Curb  4: Min assist;Other (comment)   Quad tipped cane   Curb Details (indicate cue type and reason)  VC's for sequence of BLE's/AD while ascending/descending      Neuro Re-ed    Neuro Re-ed Details   Pt standing with quad tipped cane picking up objects from floor and reaching ant. while bending at hips, and looking over shoulders, VC's for proper technique and proper body mechanics.          Pt in parallel bars on rockerboard, L/R, Fwd/back; performing static standing with no UE progressing to head nods/turns with EO with SUE support progressing to no UE support required with strong ant/L lateral lean with tactile feedback from therapist for correction.          PT Short Term  Goals - 10/17/17 1824      PT SHORT TERM GOAL #1   Title  Pt will be independent with updated HEP exercises (All STGs Target Date 11/16/2017)    Baseline        Time  30    Period  Days    Status  On-going    Target Date  11/16/17      PT SHORT TERM GOAL #2   Title  Patient ambulates 250' paved surfaces with cane with supervision.     Baseline        Time  30    Period  Days    Status  On-going    Target Date  11/16/17      PT SHORT TERM GOAL #3   Title  Timed Up & Go with cane <16sec with supervision.     Baseline        Time  30    Period  Days    Status  On-going    Target Date  11/16/17      PT SHORT TERM GOAL #4   Title  Patient standing balance reaching 10" anteriorly, to floor and looks over shoulders without balance loss with supervision.     Baseline        Time  30    Period  Days    Status  On-going    Target Date  11/16/17      PT SHORT TERM GOAL #5   Title  Patient negotiates ramps & curbs with cane with min guard.     Baseline        Time  30    Period  Days    Status  New    Target Date  11/16/17        PT Long Term Goals - 10/17/17 1822      PT LONG TERM GOAL #1   Title  Pt will demonstrate independence with ongoing HEP program and fitness plan (All LTGs Target Date 01/13/2018)    Time  90    Period  Days    Status  On-going    Target Date  01/13/18      PT LONG TERM GOAL #2   Title  Patient ambulates Modified Independent 500' outdoors including grass, pavement, curbs, ramps with cane to improve community ambulatory skills    Time  90    Period  Days    Status  On-going    Target Date  01/13/18      PT LONG TERM GOAL #3   Title  Berg Balance >/= 45/56 to indicate lower fall risk.     Time  90    Period  Days    Status  On-going    Target Date  01/13/18      PT LONG TERM GOAL #4   Title  Timed Up-Go time with cane <13.5 seconds to indicate lower fall risk.     Time  90    Period  Days    Status  On-going    Target Date  01/13/18       PT LONG TERM GOAL #5   Title  Dynamic Gait Index with cane >12/24 to indicate lower fall risk.     Time  90    Period  Days    Status  New    Target Date  01/13/18         Plan - 11/01/17 1544    Clinical Impression Statement  Todays skilled session focused on gait training with quad tipped cane while negotiating obstacles, reaching for objects with focus on bend at hip and weight shift and high level balance activities. Pt should benefit from continued PT sessions to progress toward goals.     Rehab Potential  Good    PT Frequency  2x / week    PT Duration  12 weeks    PT Treatment/Interventions  Gait training;Functional mobility training;Neuromuscular re-education;Balance training;Therapeutic exercise;Therapeutic activities;Patient/family education;ADLs/Self Care Home Management;Stair training;Orthotic Fit/Training;Manual techniques;Vestibular;Canalith Repostioning    PT Next Visit Plan  update HEP with stretches & standing balance near support.  Rockerboard with head movements, EO/EC, LE strengthening, and gait with cane while negotiating ramps/curbs.  Consulted and Agree with Plan of Care  Patient       Patient will benefit from skilled therapeutic intervention in order to improve the following deficits and impairments:  Abnormal gait, Decreased activity tolerance, Decreased balance, Decreased endurance, Decreased knowledge of use of DME, Decreased mobility, Decreased range of motion, Decreased safety awareness, Decreased strength, Impaired flexibility, Postural dysfunction, Dizziness  Visit Diagnosis: Muscle weakness (generalized)  Unsteadiness on feet  Other abnormalities of gait and mobility  Dizziness and giddiness  Other lack of coordination     Problem List Patient Active Problem List   Diagnosis Date Noted  . Bradycardia 09/16/2017  . CKD (chronic kidney disease), stage III ([place]) 09/15/2017  . Near syncope 09/14/2017  . Post-operative pain   . Dementia  without behavioral disturbance ([place])   . TIA (transient ischemic attack)   . Reactive hypertension   . Bimalleolar fracture of left ankle 05/03/2017  . Axonal neuropathy 03/21/2017  . Right sided weakness 03/01/2017  . Cigarette smoker 06/16/2016  . Dyspnea on exertion 06/15/2016  . Alzheimer disease ([place]) 01/07/2014  . CSA (central sleep apnea) 10/05/2013  . Syncope 06/26/2013  . COPD GOLD II 06/25/2013  . BPH (benign prostatic hyperplasia) 05/18/2013  . Glaucoma 05/18/2013  . S/P right TH revision 05/14/2013  . Constipation 03/26/2013  . Osteoporosis 03/26/2013  . S/P right THA, AA 03/20/2013  . Hyperlipidemia   . Erectile dysfunction   . History of asbestos exposure   . Nocturia   . History of shingles   . Dermatitis, atopic   . OA (osteoarthritis) of hip   . Arthritis   . Frequency of urination   . Lumbar scoliosis   . Malignant neoplasm of prostate ([place]) 06/07/2011  . 64 year old gentleman with stage T3 adenocarcinoma prostate with Gleason score 4+4 and PSA of 10.3 05/11/2011   Arlene Genova, PTA  Jeaninne Lodico A Donetta Isaza 11/01/2017, 3:48 PM  [facility] [address] [place], [place], [address] Phone: [phone]   Fax:  [phone]  Name: Derek Carr MRN: [id number] Date of Birth: 09-29-1931

## 2017-11-02 ENCOUNTER — Ambulatory Visit: Payer: Medicare HMO | Admitting: Physical Therapy

## 2017-11-04 ENCOUNTER — Ambulatory Visit: Payer: Medicare HMO

## 2017-11-04 DIAGNOSIS — R278 Other lack of coordination: Secondary | ICD-10-CM | POA: Diagnosis not present

## 2017-11-04 DIAGNOSIS — R2681 Unsteadiness on feet: Secondary | ICD-10-CM | POA: Diagnosis not present

## 2017-11-04 DIAGNOSIS — R42 Dizziness and giddiness: Secondary | ICD-10-CM | POA: Diagnosis not present

## 2017-11-04 DIAGNOSIS — M25672 Stiffness of left ankle, not elsewhere classified: Secondary | ICD-10-CM | POA: Diagnosis not present

## 2017-11-04 DIAGNOSIS — R2689 Other abnormalities of gait and mobility: Secondary | ICD-10-CM

## 2017-11-04 DIAGNOSIS — M6281 Muscle weakness (generalized): Secondary | ICD-10-CM | POA: Diagnosis not present

## 2017-11-04 NOTE — Therapy (Signed)
[facility] [address] [place], [place], [address] Phone: [phone]   Fax:  [phone]  Physical Therapy Treatment  Patient Details  Name: Derek Carr MRN: [id number] Date of Birth: 1931/11/09 Referring Provider (PT): Mechele Claude, [place]   Encounter Date: 11/04/2017  PT End of Session - 11/04/17 1157    Visit Number  18   recert done visit 13 so PN visit 23   Number of Visits  39    Date for PT Re-Evaluation  01/13/18    Authorization Type  [employer]na Medicare & Generic commercial 2nd    Authorization Time Period  Progress Note due visit 23    PT Start Time  1100    PT Stop Time  1155    PT Time Calculation (min)  55 min    Equipment Utilized During Treatment  Gait belt    Activity Tolerance  Patient tolerated treatment well;No increased pain    Behavior During Therapy  WFL for tasks assessed/performed       Past Medical History:  Diagnosis Date  . AAA (abdominal aortic aneurysm) ([place])   . Anemia   . Ankle fracture    left  . Aortic regurgitation   . Atherosclerosis   . Complication of anesthesia    CONFUSION - Pt's family very concerned about this  . DDD (degenerative disc disease)   . Degenerative arthritis   . Dermatitis, atopic ARMS AND LEGS  . Diverticulosis   . Erectile dysfunction   . Frequency of urination   . Glaucoma   . History of asbestos exposure   . History of radiation therapy 8/19-13-10/18/11   prostate, 78 GY  . History of shingles 2012-- BILATERAL EYES--  NO RESIDUAL  . Hyperlipidemia   . Hypertension   . Inguinal hernia   . Lumbar scoliosis   . Nocturia   . OA (osteoarthritis) of hip RIGHT  . Prostate cancer ([place]) 05/11/2011   bx=Adenocarcinoma,gleason3+4=7, 4+4=8,PSA=10.30volume=45.7cc  . Renal cyst    bilateral  . Smokers' cough ([place])   . Stroke ([place])   . Thyroid cyst     Past Surgical History:  Procedure Laterality Date  . ATTEMPTED LEFT VATS/ LEFT THORACOTOMY/  RESECTION OF THE ENORMOUS, PROBABLE BRONCHOGENIC CYST  01-04-2005  DR Arlyce Dice  . CARDIAC CATHETERIZATION  02-24-2009  DR NASHER   MINOR CORONARY ARTERY IRREGULARITIES/ NORMAL LVSF/ EF 65-70%  . CATARACT EXTRACTION    . COLONOSCOPY W/ POLYPECTOMY    . CYSTOSCOPY  11/15/2011   Procedure: CYSTOSCOPY;  Surgeon: Dutch Gray, MD;  Location: [facility];  Service: Urology;  Laterality: N/A;  no seeds seen in bladder  . esophageus cyst removal  YRS AGO  . [place]  . ORIF ANKLE FRACTURE Left 05/03/2017  . ORIF ANKLE FRACTURE Left 05/03/2017   Procedure: OPEN REDUCTION INTERNAL FIXATION (ORIF) BIMALLEOLAR  ANKLE FRACTURE;  Surgeon: Wylene Simmer, MD;  Location: [facility];  Service: Orthopedics;  Laterality: Left;  . PACEMAKER IMPLANT N/A 09/16/2017   Procedure: PACEMAKER IMPLANT;  Surgeon: Constance Haw, MD;  Location: [facility];  Service: Cardiovascular;  Laterality: N/A;  . PROSTATE BIOPSY  05/11/11   Adenocarcinoma ([facility] OFFICE)  . RADIOACTIVE SEED IMPLANT  11/15/2011   Procedure: RADIOACTIVE SEED IMPLANT;  Surgeon: Dutch Gray, MD;  [facility];  Service: Urology;  Laterality: N/A;  Total number of seeds - 52  . REPAIR RIGHT INGUINAL HERNIA W/ MESH  09-10-1999  .  TOTAL HIP ARTHROPLASTY Right 03/20/2013   Procedure: RIGHT TOTAL HIP ARTHROPLASTY ANTERIOR APPROACH;  Surgeon: [name], MD;  [facility];  Service: Orthopedics;  Laterality: Right;  . TOTAL HIP REVISION Right 05/14/2013   Procedure: open reduction internal fixation REVISION RIGHT HIP ;  Surgeon: [name], MD;  [facility];  Service: Orthopedics;  Laterality: Right;  . UMBILICAL HERNIA REPAIR  1998   epigastric    There were no vitals filed for this visit.  Subjective Assessment - 11/04/17 1309    Subjective  No falls to report.     Patient is accompained by:  Family member   Daughter, Wife   Pertinent History  left ankle fracture, TIAs, DDD, Lumbar  scoliosis, Glaucoma, cataract surgery, HTN, right THR 03/20/13 with revision 05/14/13, right femur fracture, dementia/Alzheimers    Limitations  Standing;Walking;House hold activities    Patient Stated Goals  Patient wants to improve his balance & walking    Currently in Pain?  No/denies        [facility] Adult PT Treatment/Exercise - 11/04/17 0001      Ambulation/Gait   Ambulation/Gait  Yes    Ambulation/Gait Assistance  5: Supervision    Ambulation/Gait Assistance Details  VC's for posture    Ambulation Distance (Feet)  115 Feet    Assistive device  Straight cane;Other (Comment)   Quad tipped cane   Gait Pattern  Step-through pattern;Decreased stance time - left;Decreased weight shift to left;Lateral hip instability;Wide base of support;Decreased stride length;Decreased step length - left;Poor foot clearance - right    Ambulation Surface  Level;Indoor;Unlevel;Outdoor;Paved    Ramp  5: Supervision    Ramp Details (indicate cue type and reason)  VC's for weightshift    Curb  5: Supervision    Curb Details (indicate cue type and reason)  VC's for sequence and proper use of AD.       PT Education - 11/04/17 1538    Education provided  Yes    Education Details  Educated pt and family on pt progress, assistance required, and gait training on pavement/gravel/curb/ramp to simulate crossing the street. Pt wants to be able to walk arcoss the street ro visit cousin.     Person(s) Educated  Patient;Spouse;Child(ren)    Methods  Explanation;Demonstration;Verbal cues    Comprehension  Verbalized understanding;Returned demonstration;Verbal cues required;Need further instruction       PT Short Term Goals - 10/17/17 1824      PT SHORT TERM GOAL #1   Title  Pt will be independent with updated HEP exercises (All STGs Target Date 11/16/2017)    Baseline        Time  30    Period  Days    Status  On-going    Target Date  11/16/17      PT SHORT TERM GOAL #2   Title  Patient ambulates 250' paved  surfaces with cane with supervision.     Baseline        Time  30    Period  Days    Status  On-going    Target Date  11/16/17      PT SHORT TERM GOAL #3   Title  Timed Up & Go with cane <16sec with supervision.     Baseline        Time  30    Period  Days    Status  On-going    Target Date  11/16/17      PT  SHORT TERM GOAL #4   Title  Patient standing balance reaching 10" anteriorly, to floor and looks over shoulders without balance loss with supervision.     Baseline        Time  30    Period  Days    Status  On-going    Target Date  11/16/17      PT SHORT TERM GOAL #5   Title  Patient negotiates ramps & curbs with cane with min guard.     Baseline        Time  30    Period  Days    Status  New    Target Date  11/16/17        PT Long Term Goals - 10/17/17 1822      PT LONG TERM GOAL #1   Title  Pt will demonstrate independence with ongoing HEP program and fitness plan (All LTGs Target Date 01/13/2018)    Time  90    Period  Days    Status  On-going    Target Date  01/13/18      PT LONG TERM GOAL #2   Title  Patient ambulates Modified Independent 500' outdoors including grass, pavement, curbs, ramps with cane to improve community ambulatory skills    Time  90    Period  Days    Status  On-going    Target Date  01/13/18      PT LONG TERM GOAL #3   Title  Berg Balance >/= 45/56 to indicate lower fall risk.     Time  90    Period  Days    Status  On-going    Target Date  01/13/18      PT LONG TERM GOAL #4   Title  Timed Up-Go time with cane <13.5 seconds to indicate lower fall risk.     Time  90    Period  Days    Status  On-going    Target Date  01/13/18      PT LONG TERM GOAL #5   Title  Dynamic Gait Index with cane >12/24 to indicate lower fall risk.     Time  90    Period  Days    Status  New    Target Date  01/13/18         Plan - 11/04/17 1543    Clinical Impression Statement  Todays skilled session focused on gait traning with RW and  quad tipped cane while negotiating pavement, gravel, curb, and ramp with education provided throughout to family of proper instruction, assistance level and progress made in therapy. Pt should benefit from continued PT session to progress toward unmet goals.     Rehab Potential  Good    PT Frequency  2x / week    PT Duration  12 weeks    PT Treatment/Interventions  Gait training;Functional mobility training;Neuromuscular re-education;Balance training;Therapeutic exercise;Therapeutic activities;Patient/family education;ADLs/Self Care Home Management;Stair training;Orthotic Fit/Training;Manual techniques;Vestibular;Canalith Repostioning    PT Next Visit Plan  Rockerboard with head movements, EO/EC, LE strengthening, and gait with cane while negotiating ramps/curbs, gait with SBQC with family present next session.     Consulted and Agree with Plan of Care  Patient       Patient will benefit from skilled therapeutic intervention in order to improve the following deficits and impairments:  Abnormal gait, Decreased activity tolerance, Decreased balance, Decreased endurance, Decreased knowledge of use of DME, Decreased mobility, Decreased range of motion, Decreased safety awareness,  Decreased strength, Impaired flexibility, Postural dysfunction, Dizziness  Visit Diagnosis: Muscle weakness (generalized)  Unsteadiness on feet  Other abnormalities of gait and mobility  Dizziness and giddiness  Other lack of coordination     Problem List Patient Active Problem List   Diagnosis Date Noted  . Bradycardia 09/16/2017  . CKD (chronic kidney disease), stage III ([place]) 09/15/2017  . Near syncope 09/14/2017  . Post-operative pain   . Dementia without behavioral disturbance ([place])   . TIA (transient ischemic attack)   . Reactive hypertension   . Bimalleolar fracture of left ankle 05/03/2017  . Axonal neuropathy 03/21/2017  . Right sided weakness 03/01/2017  . Cigarette smoker 06/16/2016  . Dyspnea  on exertion 06/15/2016  . Alzheimer disease ([place]) 01/07/2014  . CSA (central sleep apnea) 10/05/2013  . Syncope 06/26/2013  . COPD GOLD II 06/25/2013  . BPH (benign prostatic hyperplasia) 05/18/2013  . Glaucoma 05/18/2013  . S/P right TH revision 05/14/2013  . Constipation 03/26/2013  . Osteoporosis 03/26/2013  . S/P right THA, AA 03/20/2013  . Hyperlipidemia   . Erectile dysfunction   . History of asbestos exposure   . Nocturia   . History of shingles   . Dermatitis, atopic   . OA (osteoarthritis) of hip   . Arthritis   . Frequency of urination   . Lumbar scoliosis   . Malignant neoplasm of prostate ([place]) 06/07/2011  . 84 year old gentleman with stage T3 adenocarcinoma prostate with Gleason score 4+4 and PSA of 10.3 05/11/2011   Chassity Felts, PTA  Chassity A Felts 11/04/2017, 3:46 PM  [facility] [address] [place], [place], [address] Phone: [phone]   Fax:  [phone]  Name: CARSTEN CARSTARPHEN MRN: [id number] Date of Birth: 1931-12-25

## 2017-11-08 ENCOUNTER — Telehealth: Payer: Self-pay | Admitting: Neurology

## 2017-11-08 ENCOUNTER — Ambulatory Visit: Payer: Medicare HMO

## 2017-11-08 DIAGNOSIS — R0602 Shortness of breath: Secondary | ICD-10-CM | POA: Diagnosis not present

## 2017-11-08 DIAGNOSIS — M6281 Muscle weakness (generalized): Secondary | ICD-10-CM

## 2017-11-08 DIAGNOSIS — M25672 Stiffness of left ankle, not elsewhere classified: Secondary | ICD-10-CM | POA: Diagnosis not present

## 2017-11-08 DIAGNOSIS — R278 Other lack of coordination: Secondary | ICD-10-CM | POA: Diagnosis not present

## 2017-11-08 DIAGNOSIS — S82842A Displaced bimalleolar fracture of left lower leg, initial encounter for closed fracture: Secondary | ICD-10-CM | POA: Diagnosis not present

## 2017-11-08 DIAGNOSIS — R351 Nocturia: Secondary | ICD-10-CM | POA: Diagnosis not present

## 2017-11-08 DIAGNOSIS — R42 Dizziness and giddiness: Secondary | ICD-10-CM

## 2017-11-08 DIAGNOSIS — R531 Weakness: Secondary | ICD-10-CM | POA: Diagnosis not present

## 2017-11-08 DIAGNOSIS — R2689 Other abnormalities of gait and mobility: Secondary | ICD-10-CM

## 2017-11-08 DIAGNOSIS — R2681 Unsteadiness on feet: Secondary | ICD-10-CM | POA: Diagnosis not present

## 2017-11-08 DIAGNOSIS — Z471 Aftercare following joint replacement surgery: Secondary | ICD-10-CM | POA: Diagnosis not present

## 2017-11-08 DIAGNOSIS — G8918 Other acute postprocedural pain: Secondary | ICD-10-CM | POA: Diagnosis not present

## 2017-11-08 DIAGNOSIS — G4731 Primary central sleep apnea: Secondary | ICD-10-CM | POA: Diagnosis not present

## 2017-11-08 NOTE — Therapy (Signed)
[facility] [address] [place], [place], [address] Phone: [phone]   Fax:  [phone]  Physical Therapy Treatment  Patient Details  Name: Derek Carr MRN: [id number] Date of Birth: 1931-11-01 Referring Provider (PT): Mechele Claude, [place]   Encounter Date: 11/08/2017  PT End of Session - 11/08/17 1117    Visit Number  19   recert done visit 13 so PN visit 23   Number of Visits  39    Date for PT Re-Evaluation  01/13/18    Authorization Type  [employer] Medicare & Generic commercial 2nd    Authorization Time Period  Progress Note due visit 23    PT Start Time  1102    PT Stop Time  1147    PT Time Calculation (min)  45 min    Equipment Utilized During Treatment  Gait belt    Activity Tolerance  Patient tolerated treatment well;No increased pain    Behavior During Therapy  WFL for tasks assessed/performed       Past Medical History:  Diagnosis Date  . AAA (abdominal aortic aneurysm) ([place])   . Anemia   . Ankle fracture    left  . Aortic regurgitation   . Atherosclerosis   . Complication of anesthesia    CONFUSION - Pt's family very concerned about this  . DDD (degenerative disc disease)   . Degenerative arthritis   . Dermatitis, atopic ARMS AND LEGS  . Diverticulosis   . Erectile dysfunction   . Frequency of urination   . Glaucoma   . History of asbestos exposure   . History of radiation therapy 8/19-13-10/18/11   prostate, 81 GY  . History of shingles 2012-- BILATERAL EYES--  NO RESIDUAL  . Hyperlipidemia   . Hypertension   . Inguinal hernia   . Lumbar scoliosis   . Nocturia   . OA (osteoarthritis) of hip RIGHT  . Prostate cancer ([place]) 05/11/2011   bx=Adenocarcinoma,gleason3+4=7, 4+4=8,PSA=10.30volume=45.7cc  . Renal cyst    bilateral  . Smokers' cough ([place])   . Stroke ([place])   . Thyroid cyst     Past Surgical History:  Procedure Laterality Date  . ATTEMPTED LEFT VATS/ LEFT THORACOTOMY/  RESECTION OF THE ENORMOUS, PROBABLE BRONCHOGENIC CYST  01-04-2005  DR Arlyce Dice  . CARDIAC CATHETERIZATION  02-24-2009  DR NASHER   MINOR CORONARY ARTERY IRREGULARITIES/ NORMAL LVSF/ EF 65-70%  . CATARACT EXTRACTION    . COLONOSCOPY W/ POLYPECTOMY    . CYSTOSCOPY  11/15/2011   Procedure: CYSTOSCOPY;  Surgeon: Dutch Gray, MD;  Location: [facility];  Service: Urology;  Laterality: N/A;  no seeds seen in bladder  . esophageus cyst removal  YRS AGO  . B and E  . ORIF ANKLE FRACTURE Left 05/03/2017  . ORIF ANKLE FRACTURE Left 05/03/2017   Procedure: OPEN REDUCTION INTERNAL FIXATION (ORIF) BIMALLEOLAR  ANKLE FRACTURE;  Surgeon: Wylene Simmer, MD;  Location: [facility];  Service: Orthopedics;  Laterality: Left;  . PACEMAKER IMPLANT N/A 09/16/2017   Procedure: PACEMAKER IMPLANT;  Surgeon: Constance Haw, MD;  Location: [facility];  Service: Cardiovascular;  Laterality: N/A;  . PROSTATE BIOPSY  05/11/11   Adenocarcinoma ([facility] OFFICE)  . RADIOACTIVE SEED IMPLANT  11/15/2011   Procedure: RADIOACTIVE SEED IMPLANT;  Surgeon: Dutch Gray, MD;  Location: [facility];  Service: Urology;  Laterality: N/A;  Total number of seeds - 52  . REPAIR RIGHT INGUINAL HERNIA W/ MESH  09-10-1999  .  TOTAL HIP ARTHROPLASTY Right 03/20/2013   Procedure: RIGHT TOTAL HIP ARTHROPLASTY ANTERIOR APPROACH;  Surgeon: [name], MD;  Location: [facility];  Service: Orthopedics;  Laterality: Right;  . TOTAL HIP REVISION Right 05/14/2013   Procedure: open reduction internal fixation REVISION RIGHT HIP ;  Surgeon: [name], MD;  Location: [facility];  Service: Orthopedics;  Laterality: Right;  . UMBILICAL HERNIA REPAIR  1998   epigastric    There were no vitals filed for this visit.  Subjective Assessment - 11/08/17 1113    Subjective  No falls to report, daughter states that pt stated that he feels more dizzy with walking versus standing.     Patient is accompained by:  Family  member   Daughter   Pertinent History  left ankle fracture, TIAs, DDD, Lumbar scoliosis, Glaucoma, cataract surgery, HTN, right THR 03/20/13 with revision 05/14/13, right femur fracture, dementia/Alzheimers    Limitations  Standing;Walking;House hold activities    Patient Stated Goals  Patient wants to improve his balance & walking    Currently in Pain?  No/denies       [facility] Adult PT Treatment/Exercise - 11/08/17 1412      Ambulation/Gait   Ambulation/Gait  Yes    Ambulation/Gait Assistance  4: Min guard    Ambulation/Gait Assistance Details  Pt's daugther asked to have pt walk with [facility] outdoors to help pt navigate outdoor gait at home with an AD that he already owns. Pt required VC's for proper use of AD, upright posture and forward gaze. Pt reported some dizziness when walking, less when sitting or static standing.      Ambulation Distance (Feet)  500 Feet    Assistive device  Small based quad cane    Gait Pattern  Step-through pattern;Decreased stance time - left;Decreased weight shift to left;Lateral hip instability;Wide base of support;Decreased stride length;Decreased step length - left;Poor foot clearance - right    Ambulation Surface  Unlevel;Outdoor;Paved       PT Education - 11/08/17 1417    Education provided  Yes    Education Details  Reeducated pt's daughter on pt progress, assistance level with AD, LE strength and purpose/benefit to therapy.     Person(s) Educated  Patient;Child(ren)    Methods  Explanation    Comprehension  Verbalized understanding;Need further instruction       PT Short Term Goals - 10/17/17 1824      PT SHORT TERM GOAL #1   Title  Pt will be independent with updated HEP exercises (All STGs Target Date 11/16/2017)    Baseline        Time  30    Period  Days    Status  On-going    Target Date  11/16/17      PT SHORT TERM GOAL #2   Title  Patient ambulates 250' paved surfaces with cane with supervision.     Baseline        Time  30    Period   Days    Status  On-going    Target Date  11/16/17      PT SHORT TERM GOAL #3   Title  Timed Up & Go with cane <16sec with supervision.     Baseline        Time  30    Period  Days    Status  On-going    Target Date  11/16/17      PT SHORT TERM GOAL #4   Title  Patient standing balance reaching 10" anteriorly, to floor and looks over shoulders without balance loss with supervision.     Baseline        Time  30    Period  Days    Status  On-going    Target Date  11/16/17      PT SHORT TERM GOAL #5   Title  Patient negotiates ramps & curbs with cane with min guard.     Baseline        Time  30    Period  Days    Status  New    Target Date  11/16/17        PT Long Term Goals - 10/17/17 1822      PT LONG TERM GOAL #1   Title  Pt will demonstrate independence with ongoing HEP program and fitness plan (All LTGs Target Date 01/13/2018)    Time  90    Period  Days    Status  On-going    Target Date  01/13/18      PT LONG TERM GOAL #2   Title  Patient ambulates Modified Independent 500' outdoors including grass, pavement, curbs, ramps with cane to improve community ambulatory skills    Time  90    Period  Days    Status  On-going    Target Date  01/13/18      PT LONG TERM GOAL #3   Title  Berg Balance >/= 45/56 to indicate lower fall risk.     Time  90    Period  Days    Status  On-going    Target Date  01/13/18      PT LONG TERM GOAL #4   Title  Timed Up-Go time with cane <13.5 seconds to indicate lower fall risk.     Time  90    Period  Days    Status  On-going    Target Date  01/13/18      PT LONG TERM GOAL #5   Title  Dynamic Gait Index with cane >12/24 to indicate lower fall risk.     Time  90    Period  Days    Status  New    Target Date  01/13/18        Plan - 11/08/17 1419    Clinical Impression Statement  Todays skilled session focused on gait training outdoors with [facility] on unlevel/paved surfaces and family education on pt progress and PT  benefits. Pt should benefit from continued PT sessions to progress towards unmet goals.     Rehab Potential  Good    PT Frequency  2x / week    PT Duration  12 weeks    PT Treatment/Interventions  Gait training;Functional mobility training;Neuromuscular re-education;Balance training;Therapeutic exercise;Therapeutic activities;Patient/family education;ADLs/Self Care Home Management;Stair training;Orthotic Fit/Training;Manual techniques;Vestibular;Canalith Repostioning    PT Next Visit Plan  Rockerboard with head movements, EO/EC, LE strengthening, and gait with cane while negotiating ramps/curbs.    Consulted and Agree with Plan of Care  Patient       Patient will benefit from skilled therapeutic intervention in order to improve the following deficits and impairments:  Abnormal gait, Decreased activity tolerance, Decreased balance, Decreased endurance, Decreased knowledge of use of DME, Decreased mobility, Decreased range of motion, Decreased safety awareness, Decreased strength, Impaired flexibility, Postural dysfunction, Dizziness  Visit Diagnosis: Muscle weakness (generalized)  Other abnormalities of gait and mobility  Unsteadiness on feet  Dizziness and giddiness  Other lack of  coordination     Problem List Patient Active Problem List   Diagnosis Date Noted  . Bradycardia 09/16/2017  . CKD (chronic kidney disease), stage III ([place]) 09/15/2017  . Near syncope 09/14/2017  . Post-operative pain   . Dementia without behavioral disturbance ([place])   . TIA (transient ischemic attack)   . Reactive hypertension   . Bimalleolar fracture of left ankle 05/03/2017  . Axonal neuropathy 03/21/2017  . Right sided weakness 03/01/2017  . Cigarette smoker 06/16/2016  . Dyspnea on exertion 06/15/2016  . Alzheimer disease ([place]) 01/07/2014  . CSA (central sleep apnea) 10/05/2013  . Syncope 06/26/2013  . COPD GOLD II 06/25/2013  . BPH (benign prostatic hyperplasia) 05/18/2013  . Glaucoma  05/18/2013  . S/P right TH revision 05/14/2013  . Constipation 03/26/2013  . Osteoporosis 03/26/2013  . S/P right THA, AA 03/20/2013  . Hyperlipidemia   . Erectile dysfunction   . History of asbestos exposure   . Nocturia   . History of shingles   . Dermatitis, atopic   . OA (osteoarthritis) of hip   . Arthritis   . Frequency of urination   . Lumbar scoliosis   . Malignant neoplasm of prostate ([place]) 06/07/2011  . 84 year old gentleman with stage T3 adenocarcinoma prostate with Gleason score 4+4 and PSA of 10.3 05/11/2011   Chassity Felts, PTA  Chassity A Felts 11/08/2017, 2:24 PM  [facility] [address] [place], [place], [address] Phone: [phone]   Fax:  [phone]  Name: SADIQ MCCAULEY MRN: [id number] Date of Birth: Jun 14, 1931

## 2017-11-08 NOTE — Telephone Encounter (Signed)
Patients daughter called and requested to speak with the nurse regarding the patients sleep apnea. Not additional information was given. Please call and advise.

## 2017-11-08 NOTE — Telephone Encounter (Signed)
Pt daughter states they have apt coming up for his cpap compliance visit. The daughter states the patient is doing well but that he doesn't feel energized. She states during the night of the study he only voided one time and states that since using the machine at home he is waking up 5 times a night again. He doesn't feel as good as he did the night of the study. Advised the daughter that I will pull his download and have Dr Brett Fairy take a look and see what her thoughts are for him. Patient's daughter verbalized understanding.

## 2017-11-09 ENCOUNTER — Telehealth: Payer: Self-pay | Admitting: Neurology

## 2017-11-09 DIAGNOSIS — G4731 Primary central sleep apnea: Secondary | ICD-10-CM

## 2017-11-09 DIAGNOSIS — G459 Transient cerebral ischemic attack, unspecified: Secondary | ICD-10-CM

## 2017-11-09 DIAGNOSIS — R001 Bradycardia, unspecified: Secondary | ICD-10-CM

## 2017-11-09 DIAGNOSIS — G4710 Hypersomnia, unspecified: Secondary | ICD-10-CM

## 2017-11-09 DIAGNOSIS — R351 Nocturia: Secondary | ICD-10-CM

## 2017-11-09 NOTE — Telephone Encounter (Signed)
Called the patients daughter and advised her that Dr Brett Fairy compared the setting he is at vs the setting he was on in the sleep lab to see if we could make any adjustments that may help with this. Advised the patient's daughter that we will lower the EPR level on his machine with hopes that this may help. I will send the order to [facility]. Patient has a follow up visit in early november and we can follow up then how well he is doing.

## 2017-11-09 NOTE — Telephone Encounter (Signed)
I reviewed the CPAP download for Derek Carr who also just underwent spine surgery. His sleep during the night in the lab had been solid, only one nocturia occurred and he did well at 13 cm Water CPAP.  Reviewed download ,now that his daughter stated he is back to nocturia times 4 each night- we ordered auto- titration 10-14 cm with EPR 3 cm on the new auto-set .  The 95% pressure is 13.2 cm water,  as was seen in the attended study. He has 3.3 central apneas and 1.1 obstructive apneas remaining. 4% cheyne Stokes respiration- this can be affected by pain meds post operative.   EPR should be only 1 cm. I will change EPR and let him use that setting for 2-4 weeks.  [name], MD

## 2017-11-10 DIAGNOSIS — Z4789 Encounter for other orthopedic aftercare: Secondary | ICD-10-CM | POA: Diagnosis not present

## 2017-11-10 DIAGNOSIS — S82841D Displaced bimalleolar fracture of right lower leg, subsequent encounter for closed fracture with routine healing: Secondary | ICD-10-CM | POA: Diagnosis not present

## 2017-11-10 DIAGNOSIS — M25572 Pain in left ankle and joints of left foot: Secondary | ICD-10-CM | POA: Diagnosis not present

## 2017-11-11 ENCOUNTER — Ambulatory Visit: Payer: Medicare HMO

## 2017-11-11 DIAGNOSIS — M25672 Stiffness of left ankle, not elsewhere classified: Secondary | ICD-10-CM | POA: Diagnosis not present

## 2017-11-11 DIAGNOSIS — R42 Dizziness and giddiness: Secondary | ICD-10-CM

## 2017-11-11 DIAGNOSIS — R278 Other lack of coordination: Secondary | ICD-10-CM | POA: Diagnosis not present

## 2017-11-11 DIAGNOSIS — R2681 Unsteadiness on feet: Secondary | ICD-10-CM | POA: Diagnosis not present

## 2017-11-11 DIAGNOSIS — M6281 Muscle weakness (generalized): Secondary | ICD-10-CM

## 2017-11-11 DIAGNOSIS — R2689 Other abnormalities of gait and mobility: Secondary | ICD-10-CM

## 2017-11-11 NOTE — Patient Instructions (Signed)
Feet Apart, Head Motion - Eyes Open    In empty corner with chair in front for support, with eyes open, feet apart, standing still for 1 minute, begin to move head slowly: up and down, left to right, diagonals both ways. Repeat __10__ times per session. Do __3__ sessions per day.  Copyright  VHI. All rights reserved.

## 2017-11-12 NOTE — Therapy (Signed)
[facility] [address] [place], [place], [address] Phone: [phone]   Fax:  [phone]  Physical Therapy Treatment  Patient Details  Name: Derek Carr MRN: [id number] Date of Birth: 10-Nov-1931 Referring Provider (PT): Mechele Claude, [place]   Encounter Date: 11/11/2017  PT End of Session - 11/11/17 1113    Visit Number  20   recert done visit 13 so PN visit 23   Number of Visits  39    Date for PT Re-Evaluation  01/13/18    Authorization Type  [employer]na Medicare & Generic commercial 2nd    Authorization Time Period  Progress Note due visit 23    PT Start Time  1100    PT Stop Time  1142    PT Time Calculation (min)  42 min    Equipment Utilized During Treatment  Gait belt    Activity Tolerance  Patient tolerated treatment well;No increased pain    Behavior During Therapy  WFL for tasks assessed/performed       Past Medical History:  Diagnosis Date  . AAA (abdominal aortic aneurysm) ([place])   . Anemia   . Ankle fracture    left  . Aortic regurgitation   . Atherosclerosis   . Complication of anesthesia    CONFUSION - Pt's family very concerned about this  . DDD (degenerative disc disease)   . Degenerative arthritis   . Dermatitis, atopic ARMS AND LEGS  . Diverticulosis   . Erectile dysfunction   . Frequency of urination   . Glaucoma   . History of asbestos exposure   . History of radiation therapy 8/19-13-10/18/11   prostate, 33 GY  . History of shingles 2012-- BILATERAL EYES--  NO RESIDUAL  . Hyperlipidemia   . Hypertension   . Inguinal hernia   . Lumbar scoliosis   . Nocturia   . OA (osteoarthritis) of hip RIGHT  . Prostate cancer ([place]) 05/11/2011   bx=Adenocarcinoma,gleason3+4=7, 4+4=8,PSA=10.30volume=45.7cc  . Renal cyst    bilateral  . Smokers' cough ([place])   . Stroke ([place])   . Thyroid cyst     Past Surgical History:  Procedure Laterality Date  . ATTEMPTED LEFT VATS/ LEFT THORACOTOMY/  RESECTION OF THE ENORMOUS, PROBABLE BRONCHOGENIC CYST  01-04-2005  DR Arlyce Dice  . CARDIAC CATHETERIZATION  02-24-2009  DR NASHER   MINOR CORONARY ARTERY IRREGULARITIES/ NORMAL LVSF/ EF 65-70%  . CATARACT EXTRACTION    . COLONOSCOPY W/ POLYPECTOMY    . CYSTOSCOPY  11/15/2011   Procedure: CYSTOSCOPY;  Surgeon: Dutch Gray, MD;  Location: [facility];  Service: Urology;  Laterality: N/A;  no seeds seen in bladder  . esophageus cyst removal  YRS AGO  . [place]  . ORIF ANKLE FRACTURE Left 05/03/2017  . ORIF ANKLE FRACTURE Left 05/03/2017   Procedure: OPEN REDUCTION INTERNAL FIXATION (ORIF) BIMALLEOLAR  ANKLE FRACTURE;  Surgeon: Wylene Simmer, MD;  Location: [facility];  Service: Orthopedics;  Laterality: Left;  . PACEMAKER IMPLANT N/A 09/16/2017   Procedure: PACEMAKER IMPLANT;  Surgeon: Constance Haw, MD;  Location: [facility];  Service: Cardiovascular;  Laterality: N/A;  . PROSTATE BIOPSY  05/11/11   Adenocarcinoma (MD OFFICE)  . RADIOACTIVE SEED IMPLANT  11/15/2011   Procedure: RADIOACTIVE SEED IMPLANT;  Surgeon: Dutch Gray, MD;  Location: [facility];  Service: Urology;  Laterality: N/A;  Total number of seeds - 52  . REPAIR RIGHT INGUINAL HERNIA W/ MESH  09-10-1999  .  TOTAL HIP ARTHROPLASTY Right 03/20/2013   Procedure: RIGHT TOTAL HIP ARTHROPLASTY ANTERIOR APPROACH;  Surgeon: [name], MD;  [facility];  Service: Orthopedics;  Laterality: Right;  . TOTAL HIP REVISION Right 05/14/2013   Procedure: open reduction internal fixation REVISION RIGHT HIP ;  Surgeon: [name], MD;  [facility];  Service: Orthopedics;  Laterality: Right;  . UMBILICAL HERNIA REPAIR  1998   epigastric    There were no vitals filed for this visit.  Subjective Assessment - 11/11/17 1111    Subjective  No falls to report, pt reports no dizziness today, no constant, some days are better than others, even on the worst dizzy day its still not bad.      Patient is accompained by:  Family member   Daughter   Pertinent History  left ankle fracture, TIAs, DDD, Lumbar scoliosis, Glaucoma, cataract surgery, HTN, right THR 03/20/13 with revision 05/14/13, right femur fracture, dementia/Alzheimers    Limitations  Standing;Walking;House hold activities    Patient Stated Goals  Patient wants to improve his balance & walking    Currently in Pain?  No/denies         [facility] PT Assessment - 11/11/17 1114      Timed Up and Go Test   TUG  Normal TUG    Normal TUG (seconds)  16.94    TUG Comments  With quad tipped cane        [facility] Adult PT Treatment/Exercise - 11/12/17 1042      Ambulation/Gait   Ramp  5: Supervision    Ramp Details (indicate cue type and reason)  VC's for posture    Curb  5: Supervision    Curb Details (indicate cue type and reason)  VC's for posture      Neuro Re-ed    Neuro Re-ed Details   Pt standing with quad tipped cane reaching ant., to floor to pick up object, and looking over shoulder with VC to bend at hips, turn at waist with increased weight shift, supervision for safety, no LOB noted.        PT Education - 11/12/17 1015    Education provided  Yes    Education Details  Updated HEP with corner balance exercises.     Person(s) Educated  Patient    Methods  Explanation;Demonstration;Handout    Comprehension  Verbalized understanding;Returned demonstration;Need further instruction       PT Short Term Goals - 11/11/17 1115      PT SHORT TERM GOAL #1   Title  Pt will be independent with updated HEP exercises (All STGs Target Date 11/16/2017)    Baseline  10/18: Pt is independent with HEP.     Time  30    Period  Days    Status  Achieved      PT SHORT TERM GOAL #2   Title  Patient ambulates 250' paved surfaces with cane with supervision.     Baseline   10/15: 500' outdoor/paved surfaces with quad tipped cane, supervision.     Time  30    Period  Days    Status  Achieved      PT SHORT TERM GOAL #3    Title  Timed Up & Go with cane <16sec with supervision.     Baseline  10/18: 16.94, supervision.     Time  30    Period  Days    Status  Partially Met      PT SHORT TERM  GOAL #4   Title  Patient standing balance reaching 10" anteriorly, to floor and looks over shoulders without balance loss with supervision.     Baseline  10/18: Pt able to reach 10" anteriorly, to floor and look over shoulder with no LOB, supervision.     Time  30    Period  Days    Status  Achieved      PT SHORT TERM GOAL #5   Title  Patient negotiates ramps & curbs with cane with min guard.     Baseline  10/18: Pt negotiated ramps & curbs with quad tipped cane, Supervision.       Time  30    Period  Days    Status  Achieved        PT Long Term Goals - 10/17/17 1822      PT LONG TERM GOAL #1   Title  Pt will demonstrate independence with ongoing HEP program and fitness plan (All LTGs Target Date 01/13/2018)    Time  90    Period  Days    Status  On-going    Target Date  01/13/18      PT LONG TERM GOAL #2   Title  Patient ambulates Modified Independent 500' outdoors including grass, pavement, curbs, ramps with cane to improve community ambulatory skills    Time  90    Period  Days    Status  On-going    Target Date  01/13/18      PT LONG TERM GOAL #3   Title  Berg Balance >/= 45/56 to indicate lower fall risk.     Time  90    Period  Days    Status  On-going    Target Date  01/13/18      PT LONG TERM GOAL #4   Title  Timed Up-Go time with cane <13.5 seconds to indicate lower fall risk.     Time  90    Period  Days    Status  On-going    Target Date  01/13/18      PT LONG TERM GOAL #5   Title  Dynamic Gait Index with cane >12/24 to indicate lower fall risk.     Time  90    Period  Days    Status  New    Target Date  01/13/18        Plan - 11/12/17 1054    Clinical Impression Statement  Todays skilled session focused on updating HEP with corner balance and assessing short term goals with  5/5 goals met, 1/5 partially met. Pt is making progress and should benefit from continued PT to progress towards long term goals.     Rehab Potential  Good    PT Frequency  2x / week    PT Duration  12 weeks    PT Treatment/Interventions  Gait training;Functional mobility training;Neuromuscular re-education;Balance training;Therapeutic exercise;Therapeutic activities;Patient/family education;ADLs/Self Care Home Management;Stair training;Orthotic Fit/Training;Manual techniques;Vestibular;Canalith Repostioning    PT Next Visit Plan  Update HEP with stretches, Rockerboard with head movements, EO/EC, LE strengthening, and gait with cane while negotiating ramps/curbs.    Consulted and Agree with Plan of Care  Patient       Patient will benefit from skilled therapeutic intervention in order to improve the following deficits and impairments:  Abnormal gait, Decreased activity tolerance, Decreased balance, Decreased endurance, Decreased knowledge of use of DME, Decreased mobility, Decreased range of motion, Decreased safety awareness, Decreased strength, Impaired flexibility, Postural dysfunction,  Dizziness  Visit Diagnosis: Muscle weakness (generalized)  Other abnormalities of gait and mobility  Unsteadiness on feet  Dizziness and giddiness  Other lack of coordination     Problem List Patient Active Problem List   Diagnosis Date Noted  . Bradycardia 09/16/2017  . CKD (chronic kidney disease), stage III ([place]) 09/15/2017  . Near syncope 09/14/2017  . Post-operative pain   . Dementia without behavioral disturbance ([place])   . TIA (transient ischemic attack)   . Reactive hypertension   . Bimalleolar fracture of left ankle 05/03/2017  . Axonal neuropathy 03/21/2017  . Right sided weakness 03/01/2017  . Cigarette smoker 06/16/2016  . Dyspnea on exertion 06/15/2016  . Alzheimer disease ([place]) 01/07/2014  . CSA (central sleep apnea) 10/05/2013  . Syncope 06/26/2013  . COPD GOLD II  06/25/2013  . BPH (benign prostatic hyperplasia) 05/18/2013  . Glaucoma 05/18/2013  . S/P right TH revision 05/14/2013  . Constipation 03/26/2013  . Osteoporosis 03/26/2013  . S/P right THA, AA 03/20/2013  . Hyperlipidemia   . Erectile dysfunction   . History of asbestos exposure   . Nocturia   . History of shingles   . Dermatitis, atopic   . OA (osteoarthritis) of hip   . Arthritis   . Frequency of urination   . Lumbar scoliosis   . Malignant neoplasm of prostate ([place]) 06/07/2011  . 84 year old gentleman with stage T3 adenocarcinoma prostate with Gleason score 4+4 and PSA of 10.3 05/11/2011   Chassity Felts, PTA  Chassity A Felts 11/12/2017, 11:03 AM  [facility] [address] [place], [place], [address] Phone: [phone]   Fax:  [phone]  Name: Derek Carr MRN: [id number] Date of Birth: August 16, 1931

## 2017-11-15 ENCOUNTER — Encounter

## 2017-11-15 ENCOUNTER — Ambulatory Visit: Payer: Medicare HMO

## 2017-11-15 VITALS — BP 130/90

## 2017-11-15 DIAGNOSIS — R2689 Other abnormalities of gait and mobility: Secondary | ICD-10-CM | POA: Diagnosis not present

## 2017-11-15 DIAGNOSIS — R2681 Unsteadiness on feet: Secondary | ICD-10-CM | POA: Diagnosis not present

## 2017-11-15 DIAGNOSIS — M6281 Muscle weakness (generalized): Secondary | ICD-10-CM | POA: Diagnosis not present

## 2017-11-15 DIAGNOSIS — R42 Dizziness and giddiness: Secondary | ICD-10-CM | POA: Diagnosis not present

## 2017-11-15 DIAGNOSIS — M25672 Stiffness of left ankle, not elsewhere classified: Secondary | ICD-10-CM | POA: Diagnosis not present

## 2017-11-15 DIAGNOSIS — R278 Other lack of coordination: Secondary | ICD-10-CM

## 2017-11-15 NOTE — Therapy (Signed)
[name] [address] [place], [place], [address] Phone: [phone]   Fax:  [phone]  Physical Therapy Treatment  Patient Details  Name: Derek Carr MRN: [id number] Date of Birth: [birth year] Referring Provider (PT): Mechele Claude, [place]   Encounter Date: 11/15/2017  PT End of Session - 11/15/17 1107    Visit Number  21   recert done visit 13 so PN visit 23   Number of Visits  39    Date for PT Re-Evaluation  01/13/18    Authorization Type  [employer] Medicare & Generic commercial 2nd    Authorization Time Period  Progress Note due visit 23    PT Start Time  1102    PT Stop Time  1144    PT Time Calculation (min)  42 min    Equipment Utilized During Treatment  Gait belt    Activity Tolerance  Patient tolerated treatment well;No increased pain    Behavior During Therapy  WFL for tasks assessed/performed       Past Medical History:  Diagnosis Date  . AAA (abdominal aortic aneurysm) ([place])   . Anemia   . Ankle fracture    left  . Aortic regurgitation   . Atherosclerosis   . Complication of anesthesia    CONFUSION - Pt's family very concerned about this  . DDD (degenerative disc disease)   . Degenerative arthritis   . Dermatitis, atopic ARMS AND LEGS  . Diverticulosis   . Erectile dysfunction   . Frequency of urination   . Glaucoma   . History of asbestos exposure   . History of radiation therapy 8/19-13-10/18/11   prostate, 55 GY  . History of shingles 2012-- BILATERAL EYES--  NO RESIDUAL  . Hyperlipidemia   . Hypertension   . Inguinal hernia   . Lumbar scoliosis   . Nocturia   . OA (osteoarthritis) of hip RIGHT  . Prostate cancer ([place]) 05/11/2011   bx=Adenocarcinoma,gleason3+4=7, 4+4=8,PSA=10.30volume=45.7cc  . Renal cyst    bilateral  . Smokers' cough ([place])   . Stroke ([place])   . Thyroid cyst     Past Surgical History:  Procedure Laterality Date  . ATTEMPTED LEFT VATS/ LEFT THORACOTOMY/  RESECTION OF THE ENORMOUS, PROBABLE BRONCHOGENIC CYST  01-04-2005  DR Arlyce Dice  . CARDIAC CATHETERIZATION  02-24-2009  DR NASHER   MINOR CORONARY ARTERY IRREGULARITIES/ NORMAL LVSF/ EF 65-70%  . CATARACT EXTRACTION    . COLONOSCOPY W/ POLYPECTOMY    . CYSTOSCOPY  11/15/2011   Procedure: CYSTOSCOPY;  Surgeon: Dutch Gray, MD;  Location[facility];  Service: Urology;  Laterality: N/A;  no seeds seen in bladder  . esophageus cyst removal  YRS AGO  . [place]  . ORIF ANKLE FRACTURE Left 05/03/2017  . ORIF ANKLE FRACTURE Left 05/03/2017   Procedure: OPEN REDUCTION INTERNAL FIXATION (ORIF) BIMALLEOLAR  ANKLE FRACTURE;  Surgeon: Wylene Simmer, MD;  Location: [facility];  Service: Orthopedics;  Laterality: Left;  . PACEMAKER IMPLANT N/A 09/16/2017   Procedure: PACEMAKER IMPLANT;  Surgeon: Constance Haw, MD;  Location: [facility];  Service: Cardiovascular;  Laterality: N/A;  . PROSTATE BIOPSY  05/11/11   Adenocarcinoma (MD OFFICE)  . RADIOACTIVE SEED IMPLANT  11/15/2011   Procedure: RADIOACTIVE SEED IMPLANT;  Surgeon: Dutch Gray, MD;  Location: [facility];  Service: Urology;  Laterality: N/A;  Total number of seeds - 52  . REPAIR RIGHT INGUINAL HERNIA W/ MESH  09-10-1999  .  TOTAL HIP ARTHROPLASTY Right 03/20/2013   Procedure: RIGHT TOTAL HIP ARTHROPLASTY ANTERIOR APPROACH;  Surgeon: [name], MD;  Location: [facility];  Service: Orthopedics;  Laterality: Right;  . TOTAL HIP REVISION Right 05/14/2013   Procedure: open reduction internal fixation REVISION RIGHT HIP ;  Surgeon: [name], MD;  Location: [facility];  Service: Orthopedics;  Laterality: Right;  . UMBILICAL HERNIA REPAIR  1998   epigastric    Vitals:   11/15/17 1112  BP: 130/90    Subjective Assessment - 11/15/17 1105    Subjective  No falls to report, Pt reports that he is performing balance exercises at home and they are going well. Not much dizziness today.     Patient is  accompained by:  Family member   Daughter   Pertinent History  left ankle fracture, TIAs, DDD, Lumbar scoliosis, Glaucoma, cataract surgery, HTN, right THR 03/20/13 with revision 05/14/13, right femur fracture, dementia/Alzheimers    Limitations  Standing;Walking;House hold activities    Patient Stated Goals  Patient wants to improve his balance & walking    Currently in Pain?  No/denies        [facility] Adult PT Treatment/Exercise - 11/15/17 1107      Ambulation/Gait   Ambulation/Gait  Yes    Ambulation/Gait Assistance  4: Min guard;4: Min assist    Ambulation/Gait Assistance Details  Pt min guard prior to balance exercises, min assist after, pt became very unstable. VC's for forward gaze, pacing, and safety awareness.     Ambulation Distance (Feet)  115 Feet   x2   Assistive device  Straight cane;Other (Comment)   With rubber quad tip   Gait Pattern  Step-through pattern;Decreased stance time - left;Decreased weight shift to left;Lateral hip instability;Wide base of support;Decreased stride length;Decreased step length - left;Poor foot clearance - right    Ambulation Surface  Level;Indoor      Balance   Balance Assessed  --      Neuro Re-ed    Neuro Re-ed Details   Pt in parallel bars static standing on rockerboard ant/post/lat EO with BUE progressing to no UE support required, strong posterior lean with ant/post, strong L lat lean with side to side. Therapist provided feedback with gait belt for pt correction of strong lean, pt facing mirror to promote forward gaze.              Pt standing on blue foam beam performing heel/middle/toe touch while alt LE's, pt required max cues for sequence throughout task.         PT Short Term Goals - 11/11/17 1115      PT SHORT TERM GOAL #1   Title  Pt will be independent with updated HEP exercises (All STGs Target Date 11/16/2017)    Baseline  10/18: Pt is independent with HEP.     Time  30    Period  Days    Status  Achieved      PT SHORT  TERM GOAL #2   Title  Patient ambulates 250' paved surfaces with cane with supervision.     Baseline   10/15: 500' outdoor/paved surfaces with quad tipped cane, supervision.     Time  30    Period  Days    Status  Achieved      PT SHORT TERM GOAL #3   Title  Timed Up & Go with cane <16sec with supervision.     Baseline  10/18: 16.94, supervision.     Time  30    Period  Days    Status  Partially Met      PT SHORT TERM GOAL #4   Title  Patient standing balance reaching 10" anteriorly, to floor and looks over shoulders without balance loss with supervision.     Baseline  10/18: Pt able to reach 10" anteriorly, to floor and look over shoulder with no LOB, supervision.     Time  30    Period  Days    Status  Achieved      PT SHORT TERM GOAL #5   Title  Patient negotiates ramps & curbs with cane with min guard.     Baseline  10/18: Pt negotiated ramps & curbs with quad tipped cane, Supervision.       Time  30    Period  Days    Status  Achieved        PT Long Term Goals - 10/17/17 1822      PT LONG TERM GOAL #1   Title  Pt will demonstrate independence with ongoing HEP program and fitness plan (All LTGs Target Date 01/13/2018)    Time  90    Period  Days    Status  On-going    Target Date  01/13/18      PT LONG TERM GOAL #2   Title  Patient ambulates Modified Independent 500' outdoors including grass, pavement, curbs, ramps with cane to improve community ambulatory skills    Time  90    Period  Days    Status  On-going    Target Date  01/13/18      PT LONG TERM GOAL #3   Title  Berg Balance >/= 45/56 to indicate lower fall risk.     Time  90    Period  Days    Status  On-going    Target Date  01/13/18      PT LONG TERM GOAL #4   Title  Timed Up-Go time with cane <13.5 seconds to indicate lower fall risk.     Time  90    Period  Days    Status  On-going    Target Date  01/13/18      PT LONG TERM GOAL #5   Title  Dynamic Gait Index with cane >12/24 to indicate  lower fall risk.     Time  90    Period  Days    Status  New    Target Date  01/13/18        Plan - 11/15/17 1300    Clinical Impression Statement  Todays skilled session focused on gait training with quad tipped cane and high level balance on compliant surfaces while reducing UE support reliance. Pt is making progress and should benefit from continued PT to progress towards goals.     Rehab Potential  Good    PT Frequency  2x / week    PT Duration  12 weeks    PT Treatment/Interventions  Gait training;Functional mobility training;Neuromuscular re-education;Balance training;Therapeutic exercise;Therapeutic activities;Patient/family education;ADLs/Self Care Home Management;Stair training;Orthotic Fit/Training;Manual techniques;Vestibular;Canalith Repostioning    PT Next Visit Plan  Update HEP with stretches, Rockerboard with head movements, EO/EC, LE strengthening, and gait with cane while negotiating ramps/curbs.    Consulted and Agree with Plan of Care  Patient       Patient will benefit from skilled therapeutic intervention in order to improve the following deficits and impairments:  Abnormal gait, Decreased activity tolerance, Decreased balance, Decreased endurance,  Decreased knowledge of use of DME, Decreased mobility, Decreased range of motion, Decreased safety awareness, Decreased strength, Impaired flexibility, Postural dysfunction, Dizziness  Visit Diagnosis: Muscle weakness (generalized)  Other abnormalities of gait and mobility  Unsteadiness on feet  Other lack of coordination  Dizziness and giddiness     Problem List Patient Active Problem List   Diagnosis Date Noted  . Bradycardia 09/16/2017  . CKD (chronic kidney disease), stage III ([place]) 09/15/2017  . Near syncope 09/14/2017  . Post-operative pain   . Dementia without behavioral disturbance ([place])   . TIA (transient ischemic attack)   . Reactive hypertension   . Bimalleolar fracture of left ankle 05/03/2017   . Axonal neuropathy 03/21/2017  . Right sided weakness 03/01/2017  . Cigarette smoker 06/16/2016  . Dyspnea on exertion 06/15/2016  . Alzheimer disease ([place]) 01/07/2014  . CSA (central sleep apnea) 10/05/2013  . Syncope 06/26/2013  . COPD GOLD II 06/25/2013  . BPH (benign prostatic hyperplasia) 05/18/2013  . Glaucoma 05/18/2013  . S/P right TH revision 05/14/2013  . Constipation 03/26/2013  . Osteoporosis 03/26/2013  . S/P right THA, AA 03/20/2013  . Hyperlipidemia   . Erectile dysfunction   . History of asbestos exposure   . Nocturia   . History of shingles   . Dermatitis, atopic   . OA (osteoarthritis) of hip   . Arthritis   . Frequency of urination   . Lumbar scoliosis   . Malignant neoplasm of prostate ([place]) 06/07/2011  . 84 year old gentleman with stage T3 adenocarcinoma prostate with Gleason score 4+4 and PSA of 10.3 05/11/2011   Danaysia Rader, PTA  [facility]ona Sardinha A Damondre Pfeifle 11/15/2017, 1:04 PM  [facility] [address] [place], [place], [address] Phone: [phone]   Fax:  [phone]  Name: NICANOR MENDOLIA MRN: [id number] Date of Birth: 04/13/1931

## 2017-11-16 ENCOUNTER — Ambulatory Visit: Payer: Medicare HMO | Admitting: Physical Therapy

## 2017-11-16 DIAGNOSIS — G4733 Obstructive sleep apnea (adult) (pediatric): Secondary | ICD-10-CM | POA: Diagnosis not present

## 2017-11-18 ENCOUNTER — Ambulatory Visit: Payer: Medicare HMO

## 2017-11-18 DIAGNOSIS — R2689 Other abnormalities of gait and mobility: Secondary | ICD-10-CM

## 2017-11-18 DIAGNOSIS — M25672 Stiffness of left ankle, not elsewhere classified: Secondary | ICD-10-CM | POA: Diagnosis not present

## 2017-11-18 DIAGNOSIS — R278 Other lack of coordination: Secondary | ICD-10-CM

## 2017-11-18 DIAGNOSIS — M6281 Muscle weakness (generalized): Secondary | ICD-10-CM

## 2017-11-18 DIAGNOSIS — R42 Dizziness and giddiness: Secondary | ICD-10-CM | POA: Diagnosis not present

## 2017-11-18 DIAGNOSIS — R2681 Unsteadiness on feet: Secondary | ICD-10-CM | POA: Diagnosis not present

## 2017-11-18 NOTE — Therapy (Signed)
[facility] [address] [place], [place], [address] Phone: [phone]   Fax:  [phone]  Physical Therapy Treatment  Patient Details  Name: Derek Carr MRN: [id number] Date of Birth: Nov 04, 1931 Referring Provider (PT): Mechele Claude, [place]   Encounter Date: 11/18/2017  PT End of Session - 11/18/17 1107    Visit Number  22   recert done visit 13 so PN visit 23   Number of Visits  39    Date for PT Re-Evaluation  01/13/18    Authorization Type  [employer] Medicare & Generic commercial 2nd    Authorization Time Period  Progress Note due visit 23    PT Start Time  1100    PT Stop Time  1145    PT Time Calculation (min)  45 min    Equipment Utilized During Treatment  Gait belt    Activity Tolerance  Patient tolerated treatment well;No increased pain    Behavior During Therapy  WFL for tasks assessed/performed       Past Medical History:  Diagnosis Date  . AAA (abdominal aortic aneurysm) ([place])   . Anemia   . Ankle fracture    left  . Aortic regurgitation   . Atherosclerosis   . Complication of anesthesia    CONFUSION - Pt's family very concerned about this  . DDD (degenerative disc disease)   . Degenerative arthritis   . Dermatitis, atopic ARMS AND LEGS  . Diverticulosis   . Erectile dysfunction   . Frequency of urination   . Glaucoma   . History of asbestos exposure   . History of radiation therapy 8/19-13-10/18/11   prostate, 33 GY  . History of shingles 2012-- BILATERAL EYES--  NO RESIDUAL  . Hyperlipidemia   . Hypertension   . Inguinal hernia   . Lumbar scoliosis   . Nocturia   . OA (osteoarthritis) of hip RIGHT  . Prostate cancer ([place]) 05/11/2011   bx=Adenocarcinoma,gleason3+4=7, 4+4=8,PSA=10.30volume=45.7cc  . Renal cyst    bilateral  . Smokers' cough ([place])   . Stroke ([place])   . Thyroid cyst     Past Surgical History:  Procedure Laterality Date  . ATTEMPTED LEFT VATS/ LEFT THORACOTOMY/  RESECTION OF THE ENORMOUS, PROBABLE BRONCHOGENIC CYST  01-04-2005  DR Arlyce Dice  . CARDIAC CATHETERIZATION  02-24-2009  DR NASHER   MINOR CORONARY ARTERY IRREGULARITIES/ NORMAL LVSF/ EF 65-70%  . CATARACT EXTRACTION    . COLONOSCOPY W/ POLYPECTOMY    . CYSTOSCOPY  11/15/2011   Procedure: CYSTOSCOPY;  Surgeon: Dutch Gray, MD;  Location: [facility];  Service: Urology;  Laterality: N/A;  no seeds seen in bladder  . esophageus cyst removal  YRS AGO  . [place]  . ORIF ANKLE FRACTURE Left 05/03/2017  . ORIF ANKLE FRACTURE Left 05/03/2017   Procedure: OPEN REDUCTION INTERNAL FIXATION (ORIF) BIMALLEOLAR  ANKLE FRACTURE;  Surgeon: Wylene Simmer, MD;  Location: [facility];  Service: Orthopedics;  Laterality: Left;  . PACEMAKER IMPLANT N/A 09/16/2017   Procedure: PACEMAKER IMPLANT;  Surgeon: Constance Haw, MD;  Location: [facility];  Service: Cardiovascular;  Laterality: N/A;  . PROSTATE BIOPSY  05/11/11   Adenocarcinoma ([facility] OFFICE)  . RADIOACTIVE SEED IMPLANT  11/15/2011   Procedure: RADIOACTIVE SEED IMPLANT;  Surgeon: Dutch Gray, MD;  Location: [facility];  Service: Urology;  Laterality: N/A;  Total number of seeds - 52  . REPAIR RIGHT INGUINAL HERNIA W/ MESH  09-10-1999  .  TOTAL HIP ARTHROPLASTY Right 03/20/2013   Procedure: RIGHT TOTAL HIP ARTHROPLASTY ANTERIOR APPROACH;  Surgeon: [name], MD;  [facility];  Service: Orthopedics;  Laterality: Right;  . TOTAL HIP REVISION Right 05/14/2013   Procedure: open reduction internal fixation REVISION RIGHT HIP ;  Surgeon: [name], MD;  [facility];  Service: Orthopedics;  Laterality: Right;  . UMBILICAL HERNIA REPAIR  1998   epigastric    There were no vitals filed for this visit.  Subjective Assessment - 11/18/17 1258    Subjective  No falls to report, Pt states HEP is going well, still using BUE support often during balance activities. Tiny bit of dizziness, 3/10.     Patient  is accompained by:  Family member   daughter   Pertinent History  left ankle fracture, TIAs, DDD, Lumbar scoliosis, Glaucoma, cataract surgery, HTN, right THR 03/20/13 with revision 05/14/13, right femur fracture, dementia/Alzheimers    Limitations  Standing;Walking;House hold activities    Patient Stated Goals  Patient wants to improve his balance & walking    Currently in Pain?  No/denies        [facility] Adult PT Treatment/Exercise - 11/18/17 1300      Ambulation/Gait   Ambulation/Gait  Yes    Ambulation/Gait Assistance  4: Min guard;4: Min assist    Ambulation/Gait Assistance Details  VC's for step length, pacing and upright posture. Pt still presents with excessive UE reliance.     Ambulation Distance (Feet)  115 Feet    Assistive device  Straight cane;Other (Comment)   with rubber quad tip   Gait Pattern  Step-through pattern;Decreased stance time - left;Decreased weight shift to left;Lateral hip instability;Wide base of support;Decreased stride length;Decreased step length - left;Poor foot clearance - right    Ambulation Surface  Level;Indoor      High Level Balance   High Level Balance Activities  Negotiating over obstacles    High Level Balance Comments  L HHA/R quad tipped cane stepping over small orange hurdles with some LOB, VC's for increased hip/knee flexion to clear hurdles, sequecing of step to pattern, and pacing.       Neuro Re-ed    Neuro Re-ed Details   Pt in parallel bars performing to taps to circles in all directions with BUE/SUE progressing to no UE support with strong L lateral lean when perfoming tasks with no UE support. Pt required min vc's for instruction, sequence and upright posture.         PT Short Term Goals - 11/11/17 1115      PT SHORT TERM GOAL #1   Title  Pt will be independent with updated HEP exercises (All STGs Target Date 11/16/2017)    Baseline  10/18: Pt is independent with HEP.     Time  30    Period  Days    Status  Achieved      PT  SHORT TERM GOAL #2   Title  Patient ambulates 250' paved surfaces with cane with supervision.     Baseline   10/15: 500' outdoor/paved surfaces with quad tipped cane, supervision.     Time  30    Period  Days    Status  Achieved      PT SHORT TERM GOAL #3   Title  Timed Up & Go with cane <16sec with supervision.     Baseline  10/18: 16.94, supervision.     Time  30    Period  Days  Status  Partially Met      PT SHORT TERM GOAL #4   Title  Patient standing balance reaching 10" anteriorly, to floor and looks over shoulders without balance loss with supervision.     Baseline  10/18: Pt able to reach 10" anteriorly, to floor and look over shoulder with no LOB, supervision.     Time  30    Period  Days    Status  Achieved      PT SHORT TERM GOAL #5   Title  Patient negotiates ramps & curbs with cane with min guard.     Baseline  10/18: Pt negotiated ramps & curbs with quad tipped cane, Supervision.       Time  30    Period  Days    Status  Achieved        PT Long Term Goals - 10/17/17 1822      PT LONG TERM GOAL #1   Title  Pt will demonstrate independence with ongoing HEP program and fitness plan (All LTGs Target Date 01/13/2018)    Time  90    Period  Days    Status  On-going    Target Date  01/13/18      PT LONG TERM GOAL #2   Title  Patient ambulates Modified Independent 500' outdoors including grass, pavement, curbs, ramps with cane to improve community ambulatory skills    Time  90    Period  Days    Status  On-going    Target Date  01/13/18      PT LONG TERM GOAL #3   Title  Berg Balance >/= 45/56 to indicate lower fall risk.     Time  90    Period  Days    Status  On-going    Target Date  01/13/18      PT LONG TERM GOAL #4   Title  Timed Up-Go time with cane <13.5 seconds to indicate lower fall risk.     Time  90    Period  Days    Status  On-going    Target Date  01/13/18      PT LONG TERM GOAL #5   Title  Dynamic Gait Index with cane >12/24 to  indicate lower fall risk.     Time  90    Period  Days    Status  New    Target Date  01/13/18            Plan - 11/18/17 1617    Clinical Impression Statement  Todays skilled session focused on gait training with rubber quad tipped cane and high level balance while reducing excessive UE reliance during tasks. Pt is progressing toward goals and should benefit from continued PT session to progress towards unmet goals.     Rehab Potential  Good    PT Frequency  2x / week    PT Duration  12 weeks    PT Treatment/Interventions  Gait training;Functional mobility training;Neuromuscular re-education;Balance training;Therapeutic exercise;Therapeutic activities;Patient/family education;ADLs/Self Care Home Management;Stair training;Orthotic Fit/Training;Manual techniques;Vestibular;Canalith Repostioning    PT Next Visit Plan  Update HEP with stretches, Rockerboard with head movements, EO/EC, LE strengthening, and gait with cane while negotiating ramps/curbs, high level balance.     Consulted and Agree with Plan of Care  Patient       Patient will benefit from skilled therapeutic intervention in order to improve the following deficits and impairments:  Abnormal gait, Decreased activity tolerance, Decreased balance, Decreased  endurance, Decreased knowledge of use of DME, Decreased mobility, Decreased range of motion, Decreased safety awareness, Decreased strength, Impaired flexibility, Postural dysfunction, Dizziness  Visit Diagnosis: Muscle weakness (generalized)  Other abnormalities of gait and mobility  Unsteadiness on feet  Other lack of coordination  Dizziness and giddiness     Problem List Patient Active Problem List   Diagnosis Date Noted  . Bradycardia 09/16/2017  . CKD (chronic kidney disease), stage III ([place]) 09/15/2017  . Near syncope 09/14/2017  . Post-operative pain   . Dementia without behavioral disturbance ([place])   . TIA (transient ischemic attack)   . Reactive  hypertension   . Bimalleolar fracture of left ankle 05/03/2017  . Axonal neuropathy 03/21/2017  . Right sided weakness 03/01/2017  . Cigarette smoker 06/16/2016  . Dyspnea on exertion 06/15/2016  . Alzheimer disease ([place]) 01/07/2014  . CSA (central sleep apnea) 10/05/2013  . Syncope 06/26/2013  . COPD GOLD II 06/25/2013  . BPH (benign prostatic hyperplasia) 05/18/2013  . Glaucoma 05/18/2013  . S/P right TH revision 05/14/2013  . Constipation 03/26/2013  . Osteoporosis 03/26/2013  . S/P right THA, AA 03/20/2013  . Hyperlipidemia   . Erectile dysfunction   . History of asbestos exposure   . Nocturia   . History of shingles   . Dermatitis, atopic   . OA (osteoarthritis) of hip   . Arthritis   . Frequency of urination   . Lumbar scoliosis   . Malignant neoplasm of prostate ([place]) 06/07/2011  . 84 year old gentleman with stage T3 adenocarcinoma prostate with Gleason score 4+4 and PSA of 10.3 05/11/2011   Erva Koke, PTA  [facility] A Hamad Whyte 11/18/2017, 4:24 PM  [facility] [address] [place], [place], [address] Phone: [phone]   Fax:  [phone]  Name: Derek Carr MRN: [id number] Date of Birth: 1931-11-02

## 2017-11-22 ENCOUNTER — Ambulatory Visit: Payer: Medicare HMO | Admitting: Physical Therapy

## 2017-11-22 ENCOUNTER — Encounter: Payer: Self-pay | Admitting: Physical Therapy

## 2017-11-22 DIAGNOSIS — R278 Other lack of coordination: Secondary | ICD-10-CM

## 2017-11-22 DIAGNOSIS — M6281 Muscle weakness (generalized): Secondary | ICD-10-CM | POA: Diagnosis not present

## 2017-11-22 DIAGNOSIS — R2681 Unsteadiness on feet: Secondary | ICD-10-CM

## 2017-11-22 DIAGNOSIS — R42 Dizziness and giddiness: Secondary | ICD-10-CM | POA: Diagnosis not present

## 2017-11-22 DIAGNOSIS — M25672 Stiffness of left ankle, not elsewhere classified: Secondary | ICD-10-CM | POA: Diagnosis not present

## 2017-11-22 DIAGNOSIS — R2689 Other abnormalities of gait and mobility: Secondary | ICD-10-CM | POA: Diagnosis not present

## 2017-11-22 NOTE — Therapy (Addendum)
[facility] [address] [place], [place], [address] Phone: [phone]   Fax:  [phone]      Progress Note Reporting Period 10/17/2017 to 11/22/2008  See note below for Objective Data and Assessment of Progress/Goals.   Jamey Reas, PT, DPT PT Specializing in [place] 11/28/17 12:23 PM Phone:  [phone]  Fax:  [phone] [facility] [address] [place], [place] [address]     Physical Therapy Treatment  Patient Details  Name: Derek Carr MRN: [id number] Date of Birth: [birth year] Referring Provider (PT): Mechele Claude, [place]   Encounter Date: 11/22/2017  PT End of Session - 11/22/17 1205    Visit Number  23    Number of Visits  39    Date for PT Re-Evaluation  01/13/18    Authorization Type  [employer] Medicare & Generic commercial 2nd    Authorization Time Period  Progress Note due visit 23    PT Start Time  1102    PT Stop Time  1143    PT Time Calculation (min)  41 min    Equipment Utilized During Treatment  Gait belt    Activity Tolerance  Patient tolerated treatment well;No increased pain    Behavior During Therapy  WFL for tasks assessed/performed       Past Medical History:  Diagnosis Date  . AAA (abdominal aortic aneurysm) ([place])   . Anemia   . Ankle fracture    left  . Aortic regurgitation   . Atherosclerosis   . Complication of anesthesia    CONFUSION - Pt's family very concerned about this  . DDD (degenerative disc disease)   . Degenerative arthritis   . Dermatitis, atopic ARMS AND LEGS  . Diverticulosis   . Erectile dysfunction   . Frequency of urination   . Glaucoma   . History of asbestos exposure   . History of radiation therapy 8/19-13-10/18/11   prostate, 59 GY  . History of shingles 2012-- BILATERAL EYES--  NO RESIDUAL  . Hyperlipidemia   . Hypertension   . Inguinal hernia   . Lumbar scoliosis   . Nocturia   . OA  (osteoarthritis) of hip RIGHT  . Prostate cancer ([place]) 05/11/2011   bx=Adenocarcinoma,gleason3+4=7, 4+4=8,PSA=10.30volume=45.7cc  . Renal cyst    bilateral  . Smokers' cough ([place])   . Stroke ([place])   . Thyroid cyst     Past Surgical History:  Procedure Laterality Date  . ATTEMPTED LEFT VATS/ LEFT THORACOTOMY/ RESECTION OF THE ENORMOUS, PROBABLE BRONCHOGENIC CYST  01-04-2005  DR Arlyce Dice  . CARDIAC CATHETERIZATION  02-24-2009  DR NASHER   MINOR CORONARY ARTERY IRREGULARITIES/ NORMAL LVSF/ EF 65-70%  . CATARACT EXTRACTION    . COLONOSCOPY W/ POLYPECTOMY    . CYSTOSCOPY  11/15/2011   Procedure: CYSTOSCOPY;  Surgeon: Dutch Gray, MD;  Location: [facility];  Service: Urology;  Laterality: N/A;  no seeds seen in bladder  . esophageus cyst removal  YRS AGO  . [place]  . ORIF ANKLE FRACTURE Left 05/03/2017  . ORIF ANKLE FRACTURE Left 05/03/2017   Procedure: OPEN REDUCTION INTERNAL FIXATION (ORIF) BIMALLEOLAR  ANKLE FRACTURE;  Surgeon: Wylene Simmer, MD;  Location: [facility];  Service: Orthopedics;  Laterality: Left;  . PACEMAKER IMPLANT N/A 09/16/2017   Procedure: PACEMAKER IMPLANT;  Surgeon: Constance Haw, MD;  Location: [facility];  Service: Cardiovascular;  Laterality: N/A;  . PROSTATE BIOPSY  05/11/11  Adenocarcinoma (MD OFFICE)  . RADIOACTIVE SEED IMPLANT  11/15/2011   Procedure: RADIOACTIVE SEED IMPLANT;  Surgeon: Dutch Gray, MD;  Location: [facility];  Service: Urology;  Laterality: N/A;  Total number of seeds - 52  . REPAIR RIGHT INGUINAL HERNIA W/ MESH  09-10-1999  . TOTAL HIP ARTHROPLASTY Right 03/20/2013   Procedure: RIGHT TOTAL HIP ARTHROPLASTY ANTERIOR APPROACH;  Surgeon: [name], MD;  [facility];  Service: Orthopedics;  Laterality: Right;  . TOTAL HIP REVISION Right 05/14/2013   Procedure: open reduction internal fixation REVISION RIGHT HIP ;  Surgeon: [name], MD;  [facility];  Service: Orthopedics;   Laterality: Right;  . UMBILICAL HERNIA REPAIR  1998   epigastric    There were no vitals filed for this visit.  Subjective Assessment - 11/22/17 1148    Subjective  He has not had any falls and experiences much less dizziness since pacemaker.    Patient is accompained by:  Family member    Pertinent History  left ankle fracture, TIAs, DDD, Lumbar scoliosis, Glaucoma, cataract surgery, HTN, right THR 03/20/13 with revision 05/14/13, right femur fracture, dementia/Alzheimers    Limitations  Standing;Walking;House hold activities    Patient Stated Goals  Patient wants to improve his balance & walking    Currently in Pain?  No/denies                       [facility] Adult PT Treatment/Exercise - 11/22/17 1151      Ambulation/Gait   Ambulation/Gait  Yes    Ambulation/Gait Assistance  4: Min guard;4: Min assist    Ambulation/Gait Assistance Details  VCs for dorsiflexion, 1x LOB due to decreased dorsiflexion   Ambulation Distance (Feet)  218 Feet   218x1, 200x1   Assistive device  Straight cane;Other (Comment)   with rubber quad tip   Gait Pattern  Step-through pattern;Decreased stance time - left;Decreased weight shift to left;Lateral hip instability;Wide base of support;Decreased stride length;Decreased step length - left;Poor foot clearance - right    Ambulation Surface  Level;Indoor      High Level Balance   High Level Balance Comments  standing in parallel bars use of rocker board ant/post and lateral x2 reps for 30 seconds. step ups and step downs with 4" bilateral x5, reaching balance whiile standing on airex bilateral UE.      Neuro Re-ed    Neuro Re-ed Details   Pt in parallel bars performing to taps to circles in all directions with BUE/SUE progressing to no UE support with strong L lateral lean when perfoming tasks with no UE support. Pt required min vc's for instruction, sequence and upright posture.           PT Short Term Goals - 11/11/17 1115      PT SHORT  TERM GOAL #1   Title  Pt will be independent with updated HEP exercises (All STGs Target Date 11/16/2017)    Baseline  10/18: Pt is independent with HEP.     Time  30    Period  Days    Status  Achieved      PT SHORT TERM GOAL #2   Title  Patient ambulates 250' paved surfaces with cane with supervision.     Baseline   10/15: 500' outdoor/paved surfaces with quad tipped cane, supervision.     Time  30    Period  Days    Status  Achieved  PT SHORT TERM GOAL #3   Title  Timed Up & Go with cane <16sec with supervision.     Baseline  10/18: 16.94, supervision.     Time  30    Period  Days    Status  Partially Met      PT SHORT TERM GOAL #4   Title  Patient standing balance reaching 10" anteriorly, to floor and looks over shoulders without balance loss with supervision.     Baseline  10/18: Pt able to reach 10" anteriorly, to floor and look over shoulder with no LOB, supervision.     Time  30    Period  Days    Status  Achieved      PT SHORT TERM GOAL #5   Title  Patient negotiates ramps & curbs with cane with min guard.     Baseline  10/18: Pt negotiated ramps & curbs with quad tipped cane, Supervision.       Time  30    Period  Days    Status  Achieved        PT Long Term Goals - 10/17/17 1822      PT LONG TERM GOAL #1   Title  Pt will demonstrate independence with ongoing HEP program and fitness plan (All LTGs Target Date 01/13/2018)    Time  90    Period  Days    Status  On-going    Target Date  01/13/18      PT LONG TERM GOAL #2   Title  Patient ambulates Modified Independent 500' outdoors including grass, pavement, curbs, ramps with cane to improve community ambulatory skills    Time  90    Period  Days    Status  On-going    Target Date  01/13/18      PT LONG TERM GOAL #3   Title  Berg Balance >/= 45/56 to indicate lower fall risk.     Time  90    Period  Days    Status  On-going    Target Date  01/13/18      PT LONG TERM GOAL #4   Title  Timed Up-Go  time with cane <13.5 seconds to indicate lower fall risk.     Time  90    Period  Days    Status  On-going    Target Date  01/13/18      PT LONG TERM GOAL #5   Title  Dynamic Gait Index with cane >12/24 to indicate lower fall risk.     Time  90    Period  Days    Status  New    Target Date  01/13/18         Plan - 11/22/17 1307    Clinical Impression Statement  Todays treatment focused on gait training and balance activities.  Patient was challenged by balance activites without use of UE and required verbal cues for hip strategy to maintain balance. He was fatigued by end of treatment quality of gait decreased. Pt is progressing toward goals and would benefit from further physcial therapy to increase balance and endurance.     Clinical Impairments Affecting Rehab Potential  progressive decline over 2 years, good attitude and family support with 4 children in area.     PT Frequency  2x / week    PT Duration  12 weeks    PT Treatment/Interventions  Gait training;Functional mobility training;Neuromuscular re-education;Balance training;Therapeutic exercise;Therapeutic activities;Patient/family education;ADLs/Self Care Home Management;Stair training;Orthotic  Fit/Training;Manual techniques;Vestibular;Canalith Repostioning    PT Next Visit Plan  Continue balance actities and gait with cane,           Patient will benefit from skilled therapeutic intervention in order to improve the following deficits and impairments:  Abnormal gait, Decreased activity tolerance, Decreased balance, Decreased endurance, Decreased knowledge of use of DME, Decreased mobility, Decreased range of motion, Decreased safety awareness, Decreased strength, Impaired flexibility, Postural dysfunction, Dizziness  Visit Diagnosis: Muscle weakness (generalized)  Unsteadiness on feet  Other lack of coordination     Problem List Patient Active Problem List   Diagnosis Date Noted  . Bradycardia 09/16/2017  . CKD  (chronic kidney disease), stage III ([place]) 09/15/2017  . Near syncope 09/14/2017  . Post-operative pain   . Dementia without behavioral disturbance ([place])   . TIA (transient ischemic attack)   . Reactive hypertension   . Bimalleolar fracture of left ankle 05/03/2017  . Axonal neuropathy 03/21/2017  . Right sided weakness 03/01/2017  . Cigarette smoker 06/16/2016  . Dyspnea on exertion 06/15/2016  . Alzheimer disease ([place]) 01/07/2014  . CSA (central sleep apnea) 10/05/2013  . Syncope 06/26/2013  . COPD GOLD II 06/25/2013  . BPH (benign prostatic hyperplasia) 05/18/2013  . Glaucoma 05/18/2013  . S/P right TH revision 05/14/2013  . Constipation 03/26/2013  . Osteoporosis 03/26/2013  . S/P right THA, AA 03/20/2013  . Hyperlipidemia   . Erectile dysfunction   . History of asbestos exposure   . Nocturia   . History of shingles   . Dermatitis, atopic   . OA (osteoarthritis) of hip   . Arthritis   . Frequency of urination   . Lumbar scoliosis   . Malignant neoplasm of prostate ([place]) 06/07/2011  . 84 year old gentleman with stage T3 adenocarcinoma prostate with Gleason score 4+4 and PSA of 10.3 05/11/2011    Rhandi Despain, SPTA 11/22/2017, 1:09 PM  [facility] [address] [place], [place], [address] Phone: [phone]   Fax:  [phone]  Name: Derek Carr MRN: [id number] Date of Birth: 04/18/1931

## 2017-11-23 ENCOUNTER — Encounter: Payer: Self-pay | Admitting: Adult Health

## 2017-11-23 ENCOUNTER — Encounter: Payer: Medicare HMO | Admitting: Internal Medicine

## 2017-11-24 ENCOUNTER — Ambulatory Visit: Payer: Medicare HMO | Admitting: Physical Therapy

## 2017-11-24 ENCOUNTER — Encounter: Payer: Self-pay | Admitting: Adult Health

## 2017-11-24 ENCOUNTER — Ambulatory Visit (INDEPENDENT_AMBULATORY_CARE_PROVIDER_SITE_OTHER): Payer: Medicare HMO | Admitting: Adult Health

## 2017-11-24 ENCOUNTER — Encounter: Payer: Self-pay | Admitting: Physical Therapy

## 2017-11-24 VITALS — BP 135/71 | HR 70 | Ht 75.0 in | Wt 183.0 lb

## 2017-11-24 DIAGNOSIS — R2681 Unsteadiness on feet: Secondary | ICD-10-CM | POA: Diagnosis not present

## 2017-11-24 DIAGNOSIS — R69 Illness, unspecified: Secondary | ICD-10-CM | POA: Diagnosis not present

## 2017-11-24 DIAGNOSIS — R42 Dizziness and giddiness: Secondary | ICD-10-CM | POA: Diagnosis not present

## 2017-11-24 DIAGNOSIS — F0390 Unspecified dementia without behavioral disturbance: Secondary | ICD-10-CM | POA: Diagnosis not present

## 2017-11-24 DIAGNOSIS — R2689 Other abnormalities of gait and mobility: Secondary | ICD-10-CM | POA: Diagnosis not present

## 2017-11-24 DIAGNOSIS — R278 Other lack of coordination: Secondary | ICD-10-CM | POA: Diagnosis not present

## 2017-11-24 DIAGNOSIS — G459 Transient cerebral ischemic attack, unspecified: Secondary | ICD-10-CM

## 2017-11-24 DIAGNOSIS — M6281 Muscle weakness (generalized): Secondary | ICD-10-CM | POA: Diagnosis not present

## 2017-11-24 DIAGNOSIS — M25672 Stiffness of left ankle, not elsewhere classified: Secondary | ICD-10-CM | POA: Diagnosis not present

## 2017-11-24 NOTE — Therapy (Signed)
[facility] [address] [place], [place], [address] Phone: [phone]   Fax:  [phone]  Physical Therapy Treatment  Patient Details  Name: Derek Carr MRN: [id number] Date of Birth: 03-Feb-1931 Referring Provider (PT): Mechele Claude, [place]   Encounter Date: 11/24/2017  PT End of Session - 11/24/17 1135    Visit Number  24    Number of Visits  39    Date for PT Re-Evaluation  01/13/18    Authorization Type  [employer] Medicare & Generic commercial 2nd    Authorization Time Period  Progress Note due visit 23    PT Start Time  1102    PT Stop Time  1120   patient needed to end treatment due to appoinment with neurologist   PT Time Calculation (min)  18 min    Equipment Utilized During Treatment  Gait belt    Activity Tolerance  Patient tolerated treatment well;No increased pain    Behavior During Therapy  WFL for tasks assessed/performed       Past Medical History:  Diagnosis Date  . AAA (abdominal aortic aneurysm) ([place])   . Anemia   . Ankle fracture    left  . Aortic regurgitation   . Atherosclerosis   . Complication of anesthesia    CONFUSION - Pt's family very concerned about this  . DDD (degenerative disc disease)   . Degenerative arthritis   . Dermatitis, atopic ARMS AND LEGS  . Diverticulosis   . Erectile dysfunction   . Frequency of urination   . Glaucoma   . History of asbestos exposure   . History of radiation therapy 8/19-13-10/18/11   prostate, 55 GY  . History of shingles 2012-- BILATERAL EYES--  NO RESIDUAL  . Hyperlipidemia   . Hypertension   . Inguinal hernia   . Lumbar scoliosis   . Nocturia   . OA (osteoarthritis) of hip RIGHT  . Prostate cancer ([place]) 05/11/2011   bx=Adenocarcinoma,gleason3+4=7, 4+4=8,PSA=10.30volume=45.7cc  . Renal cyst    bilateral  . Smokers' cough ([place])   . Stroke ([place])   . Thyroid cyst     Past Surgical History:  Procedure Laterality Date  . ATTEMPTED LEFT  VATS/ LEFT THORACOTOMY/ RESECTION OF THE ENORMOUS, PROBABLE BRONCHOGENIC CYST  01-04-2005  DR Arlyce Dice  . CARDIAC CATHETERIZATION  02-24-2009  DR NASHER   MINOR CORONARY ARTERY IRREGULARITIES/ NORMAL LVSF/ EF 65-70%  . CATARACT EXTRACTION    . COLONOSCOPY W/ POLYPECTOMY    . CYSTOSCOPY  11/15/2011   Procedure: CYSTOSCOPY;  Surgeon: Dutch Gray, MD;  Location: [facility];  Service: Urology;  Laterality: N/A;  no seeds seen in bladder  . esophageus cyst removal  YRS AGO  . [place]  . ORIF ANKLE FRACTURE Left 05/03/2017  . ORIF ANKLE FRACTURE Left 05/03/2017   Procedure: OPEN REDUCTION INTERNAL FIXATION (ORIF) BIMALLEOLAR  ANKLE FRACTURE;  Surgeon: Wylene Simmer, MD;  Location: [facility];  Service: Orthopedics;  Laterality: Left;  . PACEMAKER IMPLANT N/A 09/16/2017   Procedure: PACEMAKER IMPLANT;  Surgeon: Constance Haw, MD;  Location: [facility];  Service: Cardiovascular;  Laterality: N/A;  . PROSTATE BIOPSY  05/11/11   Adenocarcinoma ([facility] OFFICE)  . RADIOACTIVE SEED IMPLANT  11/15/2011   Procedure: RADIOACTIVE SEED IMPLANT;  Surgeon: Dutch Gray, MD;  Location: [facility];  Service: Urology;  Laterality: N/A;  Total number of seeds - 52  . REPAIR RIGHT INGUINAL HERNIA W/ MESH  09-10-1999  . TOTAL HIP ARTHROPLASTY Right 03/20/2013   Procedure: RIGHT TOTAL HIP ARTHROPLASTY ANTERIOR APPROACH;  Surgeon: [name], MD;  [facility];  Service: Orthopedics;  Laterality: Right;  . TOTAL HIP REVISION Right 05/14/2013   Procedure: open reduction internal fixation REVISION RIGHT HIP ;  Surgeon: [name], MD;  [facility];  Service: Orthopedics;  Laterality: Right;  . UMBILICAL HERNIA REPAIR  1998   epigastric    There were no vitals filed for this visit.  Subjective Assessment - 11/24/17 1124    Subjective  He has not had any falls but still experiencing a 3/10 dizziness. Patients daughter stated that his BP has been at  baseline.    Patient is accompained by:  Family member    Pertinent History  left ankle fracture, TIAs, DDD, Lumbar scoliosis, Glaucoma, cataract surgery, HTN, right THR 03/20/13 with revision 05/14/13, right femur fracture, dementia/Alzheimers    Limitations  Standing;Walking;House hold activities    Patient Stated Goals  Patient wants to improve his balance & walking    Currently in Pain?  No/denies                       [facility] Adult PT Treatment/Exercise - 11/24/17 1126      Ambulation/Gait   Ambulation/Gait  Yes    Ambulation/Gait Assistance  4: Min assist;4: Min guard    Ambulation/Gait Assistance Details  patient was min assist with cane due to LOB from R LE toe catch.    Ambulation Distance (Feet)  230 Feet    Assistive device  Straight cane    Gait Pattern  Step-through pattern;Decreased stance time - left;Decreased weight shift to left;Lateral hip instability;Wide base of support;Decreased stride length;Decreased step length - left;Poor foot clearance - right    Ambulation Surface  Level;Indoor    Gait Comments  Patient experieced 2x LOB due decrease of dorsiflexion  of R LE. Verbal cues for decreased gait speed for safety and posture. Patient initiated gait without use of assistive device.     High Level Balance   High Level Balance Activities  Other (comment)    High Level Balance Comments  Patient performed heel touches bilaterally while standing in parallel bars 10x while on level surfaces. He required verbal cues for correct posture and min assist to maintain balance ( tends to lean anteriorly). Performed rocker board anterior/posterior with min assist for balance and verbal cues for posture 2x30 sec.            PT Short Term Goals - 11/11/17 1115      PT SHORT TERM GOAL #1   Title  Pt will be independent with updated HEP exercises (All STGs Target Date 11/16/2017)    Baseline  10/18: Pt is independent with HEP.     Time  30    Period  Days    Status   Achieved      PT SHORT TERM GOAL #2   Title  Patient ambulates 250' paved surfaces with cane with supervision.     Baseline   10/15: 500' outdoor/paved surfaces with quad tipped cane, supervision.     Time  30    Period  Days    Status  Achieved      PT SHORT TERM GOAL #3   Title  Timed Up & Go with cane <16sec with supervision.     Baseline  10/18: 16.94, supervision.     Time  30  Period  Days    Status  Partially Met      PT SHORT TERM GOAL #4   Title  Patient standing balance reaching 10" anteriorly, to floor and looks over shoulders without balance loss with supervision.     Baseline  10/18: Pt able to reach 10" anteriorly, to floor and look over shoulder with no LOB, supervision.     Time  30    Period  Days    Status  Achieved      PT SHORT TERM GOAL #5   Title  Patient negotiates ramps & curbs with cane with min guard.     Baseline  10/18: Pt negotiated ramps & curbs with quad tipped cane, Supervision.       Time  30    Period  Days    Status  Achieved        PT Long Term Goals - 10/17/17 1822      PT LONG TERM GOAL #1   Title  Pt will demonstrate independence with ongoing HEP program and fitness plan (All LTGs Target Date 01/13/2018)    Time  90    Period  Days    Status  On-going    Target Date  01/13/18      PT LONG TERM GOAL #2   Title  Patient ambulates Modified Independent 500' outdoors including grass, pavement, curbs, ramps with cane to improve community ambulatory skills    Time  90    Period  Days    Status  On-going    Target Date  01/13/18      PT LONG TERM GOAL #3   Title  Berg Balance >/= 45/56 to indicate lower fall risk.     Time  90    Period  Days    Status  On-going    Target Date  01/13/18      PT LONG TERM GOAL #4   Title  Timed Up-Go time with cane <13.5 seconds to indicate lower fall risk.     Time  90    Period  Days    Status  On-going    Target Date  01/13/18      PT LONG TERM GOAL #5   Title  Dynamic Gait Index with  cane >12/24 to indicate lower fall risk.     Time  90    Period  Days    Status  New    Target Date  01/13/18            Plan - 11/24/17 1136    Clinical Impression Statement  Treatment was focused on gait with SPC and balance activities. He demonstrated improved balance but required increased assistance with gait today. Verbal cues were given for safety. Patient would benefit from further therapy to increase balance, gait, and increase safety awareness.    Clinical Presentation  Evolving    Rehab Potential  Good    Clinical Impairments Affecting Rehab Potential  progressive decline over 2 years, good attitude and family support with 4 children in area.     PT Frequency  2x / week    PT Duration  12 weeks    PT Treatment/Interventions  Gait training;Functional mobility training;Neuromuscular re-education;Balance training;Therapeutic exercise;Therapeutic activities;Patient/family education;ADLs/Self Care Home Management;Stair training;Orthotic Fit/Training;Manual techniques;Vestibular;Canalith Repostioning    PT Next Visit Plan  Continue balance actities and gait with cane,     Consulted and Agree with Plan of Care  Patient;Family member/caregiver    Family Member Consulted  daughter       Patient will benefit from skilled therapeutic intervention in order to improve the following deficits and impairments:  Abnormal gait, Decreased activity tolerance, Decreased balance, Decreased endurance, Decreased knowledge of use of DME, Decreased mobility, Decreased range of motion, Decreased safety awareness, Decreased strength, Impaired flexibility, Postural dysfunction, Dizziness  Visit Diagnosis: Unsteadiness on feet  Other abnormalities of gait and mobility     Problem List Patient Active Problem List   Diagnosis Date Noted  . Bradycardia 09/16/2017  . CKD (chronic kidney disease), stage III ([place]) 09/15/2017  . Near syncope 09/14/2017  . Post-operative pain   . Dementia without  behavioral disturbance ([place])   . TIA (transient ischemic attack)   . Reactive hypertension   . Bimalleolar fracture of left ankle 05/03/2017  . Axonal neuropathy 03/21/2017  . Right sided weakness 03/01/2017  . Cigarette smoker 06/16/2016  . Dyspnea on exertion 06/15/2016  . Alzheimer disease ([place]) 01/07/2014  . CSA (central sleep apnea) 10/05/2013  . Syncope 06/26/2013  . COPD GOLD II 06/25/2013  . BPH (benign prostatic hyperplasia) 05/18/2013  . Glaucoma 05/18/2013  . S/P right TH revision 05/14/2013  . Constipation 03/26/2013  . Osteoporosis 03/26/2013  . S/P right THA, AA 03/20/2013  . Hyperlipidemia   . Erectile dysfunction   . History of asbestos exposure   . Nocturia   . History of shingles   . Dermatitis, atopic   . OA (osteoarthritis) of hip   . Arthritis   . Frequency of urination   . Lumbar scoliosis   . Malignant neoplasm of prostate ([place]) 06/07/2011  . 84 year old gentleman with stage T3 adenocarcinoma prostate with Gleason score 4+4 and PSA of 10.3 05/11/2011    Derek Carr SPTA 11/24/2017, 3:51 PM  [facility] [address] [place], [place], [address] Phone: [phone]   Fax:  [phone]  Name: Derek Carr MRN: [id number] Date of Birth: 15-Mar-1931

## 2017-11-24 NOTE — Progress Notes (Signed)
[facility] [address] [place]. [place] [address] [phone]       OFFICE FOLLOW-UP NOTE  [name] Date of Birth:  01-01-1932 Medical Record Number:  [id number]   HPI: Derek Carr is a 84 year old African-American gentleman who is accompanied today by his daughter. History is obtained from them as well as review of electronic medical records. I have personally reviewed imaging films.Derek Carr is an 84 y.o. male with hypertension and hyperlipidemia.  Per patient approximately 7:00 in the morning on 03/01/2017 patient went to get a right cataract surgery.  He did not stop his aspirin or Plavix for this per patient.  He then took a nap at approximately 1300 hrs. patient awoke at 6 PM and daughter called EMSfor right-sided weakness, facial droop, dysarthria.  Daughter states that he was confused but patient states that he was having a difficult time understanding him which would be more of a receptive aphasia.    By the time EMS arrived (approximately 10 minutes ) all these symptoms had resolved the exception of the right upper extremity weakness--which also quickly resolved.  Patient is noted to have a TIA back in December 2018 (as noted below ) and was placed on Plavix along with his aspirin by his primary care physician.  Patient was not seen in the hospital at that time.Per daughter patient has had one further event prior to this.  At that time they noticed that he had a left facial droop along with leaning to the left.  He did not have any dysarthria at that time.  The symptoms lasted very quickly for approximately 10 minutes. Date last known well: Date: 03/01/2017.Time last known well: Time: 13:00.tPA Given: No: Minimal symptoms.NIH stroke scale at this point is 0.Modified Rankin: Rankin Score=0 MRI scan of the brain showed no acute infarct. LDL cholesterol 50 mg percent. Albumin A1c was 6. Echocardiogram showed no cardiac source of embolism and cardiac  ultrasound was unremarkable. Patient's daughter described that for the past 2 years she had noticed slowing of patient's mentation, speech, ambulation and some swallowing difficulties. He had been evaluated in the past by ENT and by neurologist Dr. Jennings Books at [facility]. He had EMG nerve conduction study which showed sensorimotor neuropathy. Patient has not been started on dementia medications. Is currently living with his wife. He is independent but daughter has noticed cognitive decline recently. Patient ambulatory with a cane but his balance seems to be poor. He does also have sleep apnea but is having trouble using his CPAP mask. The daughter noticed excessive daytime sleepiness and he can fall asleep even in the middle of conversations. He has not seen Dr. Brett Fairy recently for these issues. The patient denies any significant weight loss, muscle fasciculations, muscle wasting or weakness. He is currently on dual antiplatelet therapy aspirin and Plavix daughter get well without bruising or bleeding.  06/21/17 UPDATE: Patient returns today for follow-up visit.  At previous appointment it was recommended to start Aricept but due to multiple side effects from other medications such as dizziness, Aricept was not started.  Daughter states that his memory and concentration are both stable as they have not gotten worse.  Daughter continues to live with patient for assistance with some ADLs and IADLs.  Continues to take aspirin without side effects of bleeding or bruising.  Was recently taken off cholesterol medication due to possible side effects.  Recent EEG was normal and negative for seizure activity.  PT/OT continues  to come to the house.  He is using a wheelchair for long distance but is able to walk with a rolling walker for short distance.  Blood pressure today satisfactory 142/82.  He has been compliant with CPAP machine and follows Dr. Brett Fairy for sleep apnea.  He did recently fractured his ankle on  the left side as he was trying to step out of the truck.  Currently using boot.  Denies new or worsening stroke/TIA symptoms.  Interval History 11/24/17: Patient is being seen today as requested by daughter as she has continued concerns of neurological episodes. She states approx 1x per week, he will have slurred speech, weak voice, generalized muscle slowing, and worsening in gait. She has checked blood pressure at this time. She denies checking glucose levels during these. Patient feels as though "something is coming and going" but has no specific complaints. Patient denies headache, vision difficulty, worsening dizziness. Recent admission on 09/14/17 due to syncope and bradycardia. Pacemaker was placed. Daughter states SOB and endurance improved but denies any improvement with these episodes. Does not happen at certain time of day, activity or meals. Patient did have episode while inpatient recently for pacemaker implant with facial droop and dysarthria.  Patient found to be hypoglycemic with glucose level 57 along with being hypotensive. Once corrected, symptoms resolved. Denies anxiety or depression. Has been complaint with CPAP for OSA management.  Daughter concerned that these episodes could be an issue that can be corrected as she names specific possibilities such as autoimmune disorder, ALS, guilliane barre, peripheral neuropathy, seizures, recurrent strokes/TIA or worsening nerve impingement due to spinal stenosis. Daughter is also questioning whether he can be started on a medication for neuropathy. When patient questioned regarding neuropathy symptoms, he states he only experiences numbness at time but overall doesn't both him but then the daughter spoke up and stated "yes you do dad. You have numbness daily". Patient declined any type of nerve pain related to neuropathy.   Advised daughter that an EEG has been obtained multiple times in the past which was negative for seizure activity.  EMG/NCV was  also obtained in the past which did show sensorimotor neuropathy.  Extensive lab work was also obtained during prior admission which all came back satisfactory without concern.  Daughter questioning whether he should be seen by neurosurgeon or spine specialist for evaluation but as patient declines any type of back pain, neuropathy pain or any other type of pain, daughter was advised that he most likely will not be a candidate for any type of surgical procedure or injection as he has no symptoms.  Spoke to daughter regarding possible worsening of dementia but she states memory has not worsened.     ROS:   14 system review of systems is positive for activity change, daytime sleepiness, environmental allergies, incontinence of bladder, frequency of urination, urgency, joint pain, joint swelling, walking difficulty, memory loss, numbness, speech difficulty, and decreased concentration and all other systems negative  PMH:  Past Medical History:  Diagnosis Date  . AAA (abdominal aortic aneurysm) ([place])   . Anemia   . Ankle fracture    left  . Aortic regurgitation   . Atherosclerosis   . Complication of anesthesia    CONFUSION - Pt's family very concerned about this  . DDD (degenerative disc disease)   . Degenerative arthritis   . Dermatitis, atopic ARMS AND LEGS  . Diverticulosis   . Erectile dysfunction   . Frequency of urination   . Glaucoma   .  History of asbestos exposure   . History of radiation therapy 8/19-13-10/18/11   prostate, 28 GY  . History of shingles 2012-- BILATERAL EYES--  NO RESIDUAL  . Hyperlipidemia   . Hypertension   . Inguinal hernia   . Lumbar scoliosis   . Nocturia   . OA (osteoarthritis) of hip RIGHT  . Prostate cancer ([place]) 05/11/2011   bx=Adenocarcinoma,gleason3+4=7, 4+4=8,PSA=10.30volume=45.7cc  . Renal cyst    bilateral  . Smokers' cough ([place])   . Stroke ([place])   . Thyroid cyst     Social History:  Social History   Socioeconomic History  . Marital  status: Married    Spouse name: Alison Murray  . Number of children: 4  . Years of education: College  . Highest education level: Not on file  Occupational History  . Occupation:       Employer: [employer]    Comment: Retired  Scientific laboratory technician  . Financial resource strain: Not on file  . Food insecurity:    Worry: Not on file    Inability: Not on file  . Transportation needs:    Medical: Not on file    Non-medical: Not on file  Tobacco Use  . Smoking status: Former Smoker    Packs/day: 0.50    Years: 62.00    Pack years: 31.00    Types: Cigarettes  . Smokeless tobacco: Never Used  . Tobacco comment: smoke one or two per day  Substance and Sexual Activity  . Alcohol use: No    Alcohol/week: 1.0 standard drinks    Types: 1 Cans of beer per week  . Drug use: No    Comment: quit smoking 02/2012  . Sexual activity: Not on file  Lifestyle  . Physical activity:    Days per week: Not on file    Minutes per session: Not on file  . Stress: Not on file  Relationships  . Social connections:    Talks on phone: Not on file    Gets together: Not on file    Attends religious service: Not on file    Active member of club or organization: Not on file    Attends meetings of clubs or organizations: Not on file    Relationship status: Not on file  . Intimate partner violence:    Fear of current or ex partner: Not on file    Emotionally abused: Not on file    Physically abused: Not on file    Forced sexual activity: Not on file  Other Topics Concern  . Not on file  Social History Narrative   Patient is Married Designer, television/film set), and lives at home with his wife 97 years   Patient has 3 daughters and 1 son.   Patient has a college education.   Patient is right-handed.   Patient drinks one cup of coffee, 1-2 cups of tea daily and rarely drinks soda.   Retired from [employer]    Medications:   Current Outpatient Medications on File Prior to Visit  Medication Sig Dispense Refill  . acetaminophen  (TYLENOL) 500 MG tablet Take 1,000 mg by mouth 2 (two) times daily.     . Ascorbic Acid (VITAMIN C) 1000 MG tablet Take 1,000 mg by mouth daily.    Marland Kitchen aspirin EC 81 MG tablet Take 81 mg by mouth daily.    . Calcium Carbonate Antacid (TUMS PO) Take 1 tablet by mouth daily.    . cetirizine (ZYRTEC) 10 MG tablet Take 10 mg by mouth daily.    Marland Kitchen  Cholecalciferol (VITAMIN D-3) 1000 units CAPS Take 1 capsule by mouth 2 (two) times daily.     . clobetasol cream (TEMOVATE) 9.38 % Apply 1 application topically daily at 12 noon.    . Cyanocobalamin (VITAMIN B12) 1000 MCG TBCR Take 1 tablet by mouth 2 (two) times daily.     . Docosahexaenoic Acid (DHA OMEGA 3 PO) Take 1,000 mg by mouth daily.     Marland Kitchen docusate sodium (COLACE) 100 MG capsule Take 1 capsule (100 mg total) by mouth 2 (two) times daily. While taking narcotic pain medicine. 30 capsule 0  . folic acid (FOLVITE) 1 MG tablet Take 1 mg by mouth daily.    . irbesartan (AVAPRO) 75 MG tablet Take 37.5 mg by mouth at bedtime. **REPLACES VALSARTAN**  11  . Omega 3-6-9 Fatty Acids (OMEGA DHA PO) Take 1 tablet by mouth at bedtime.     . Probiotic Product (PROBIOTIC PO) Take 1 capsule by mouth daily.     Marland Kitchen senna (SENOKOT) 8.6 MG TABS tablet Take 2 tablets (17.2 mg total) by mouth 2 (two) times daily. 30 each 0  . Ubiquinol (QUNOL COQ10/UBIQUINOL/MEGA) 100 MG CAPS Take 1 tablet by mouth at bedtime.     . valACYclovir (VALTREX) 1000 MG tablet Take 1,000 mg by mouth daily.     No current facility-administered medications on file prior to visit.     Allergies:   Allergies  Allergen Reactions  . Oxycodone Other (See Comments)  . Dilaudid [Hydromorphone Hcl] Other (See Comments)    Confusion   . Methocarbamol Other (See Comments)    Drowsiness   . Percodan [Oxycodone-Aspirin] Other (See Comments)    Confusion   . Tramadol Other (See Comments)    Drowsiness   . Vicodin [Hydrocodone-Acetaminophen] Other (See Comments)    Confusion     Physical  Exam General: well developed, pleasant elderly African-American male, well nourished, seated, in no evident distress Head: head normocephalic and atraumatic.  Neck: supple with no carotid or supraclavicular bruits Cardiovascular: regular rate and rhythm, no murmurs Musculoskeletal: no deformity Skin:  no rash/petichiae Vascular:  Normal pulses all extremities Vitals:   11/24/17 1150  BP: 135/71  Pulse: 70   Neurologic Exam Mental Status: Awake and fully alert.  Mild dysarthria.  Remote memory poor Attention span, concentration and fund of knowledge diminished Mood and affect appropriate.  Mini-Mental status exam scored 17/30 (previously 18/30).   MMSE - Mini Mental State Exam 11/24/2017 06/21/2017 03/21/2017  Orientation to time 3 2 4   Orientation to Place 4 4 3   Registration 3 3 3   Attention/ Calculation 1 0 0  Recall 0 0 1  Language- name 2 objects 2 2 2   Language- repeat 0 1 1  Language- follow 3 step command 3 3 3   Language- read & follow direction 1 1 1   Write a sentence 0 1 1  Copy design 0 1 1  Total score 17 18 20     Cranial Nerves: Fundoscopic exam deferred. Pupils equal, briskly reactive to light. Extraocular movements full without nystagmus. Visual fields full to confrontation. Hearing intact. Facial sensation intact. Face, tongue, palate moves normally and symmetrically. No  tongue atrophy or fasciculations noted. Motor: Normal bulk and tone. Normal strength in all tested extremity muscles. No fasciculations or muscle wasting noted. Sensory.: Diminished touch ,pinprick .position and vibratory sensation from ankle down.  Coordination: Rapid alternating movements normal in all extremities. Finger-to-nose and heel-to-shin performed accurately bilaterally. Gait and Station: Patient needs mild assistance to  stand.  Hunched stand.  Ambulates with slow cautious steps with assistance of rolling walker. Reflexes: 1+ and symmetric. Toes downgoing.     ASSESSMENT: 84 year old  African-American gentleman with recent episode of TIA from small vessel disease in February 2019 with suspected underlying mild dementia as well as peripheral neuropathy.  Patient is being seen today as requested by daughter due to continued weekly episodes of worsening dysarthria, slowed movement and worsening dizziness.    PLAN: -Continue aspirin 81 mg daily for secondary stroke prevention  -Referral placed to neuropsychology for neurocognitive evaluation -We will reach out to Dr. Leonie Man in regards to possible further recommendations -Advised daughter to start to monitor glucose levels at home along with checking glucose levels during episodes -Continue to monitor blood pressure at home along with monitoring during episodes -Recommended initiating Aricept or Namenda for memory but daughter declines at this time -Continue to follow up with PCP regarding HLD and HTN management  -f/u Dr. Brett Fairy for continued OSA and CPAP management -Maintain strict control of hypertension with blood pressure goal below 130/90, diabetes with hemoglobin A1c goal below 6.5% and cholesterol with LDL cholesterol (bad cholesterol) goal below 70 mg/dL. I also advised the patient to eat a healthy diet with plenty of whole grains, cereals, fruits and vegetables, exercise regularly and maintain ideal body weight.  Followup in the future with me in 3 months or call earlier if needed  Greater than 50% of time during this 25 minute visit was spent on counseling,explanation of diagnosis of TIA, reviewing risk factor management of HLD, HTN and memory loss, planning of further management, discussion with patient and family and coordination of care  Venancio Poisson, [facility]  [facility] [address] [place], [name] [address]  Phone [phone] Fax [phone]

## 2017-11-24 NOTE — Patient Instructions (Signed)
Continue aspirin 81 mg daily for secondary stroke prevention  Continue to follow up with PCP regarding cholesterol, blood pressure and diabetes management   Referral placed for neurocognitive testing - you will be called to schedule appointment  Continue therapy   Follow up with cardiologist as scheduled  Continue to monitor blood pressure at home  Maintain strict control of hypertension with blood pressure goal below 130/90, diabetes with hemoglobin A1c goal below 6.5% and cholesterol with LDL cholesterol (bad cholesterol) goal below 70 mg/dL. I also advised the patient to eat a healthy diet with plenty of whole grains, cereals, fruits and vegetables, exercise regularly and maintain ideal body weight.  Followup in the future with me in 3 months or call earlier if needed       Thank you for coming to see [place] at [facility]. I hope we have been able to provide you high quality care today.  You may receive a patient satisfaction survey over the next few weeks. We would appreciate your feedback and comments so that we may continue to improve ourselves and the health of our patients.

## 2017-11-25 ENCOUNTER — Encounter: Payer: Self-pay | Admitting: Adult Health

## 2017-11-25 ENCOUNTER — Ambulatory Visit (INDEPENDENT_AMBULATORY_CARE_PROVIDER_SITE_OTHER): Payer: Medicare HMO | Admitting: Adult Health

## 2017-11-25 VITALS — BP 146/87 | HR 93 | Ht 75.0 in | Wt 181.4 lb

## 2017-11-25 DIAGNOSIS — Z9989 Dependence on other enabling machines and devices: Secondary | ICD-10-CM | POA: Diagnosis not present

## 2017-11-25 DIAGNOSIS — G4733 Obstructive sleep apnea (adult) (pediatric): Secondary | ICD-10-CM | POA: Diagnosis not present

## 2017-11-25 NOTE — Progress Notes (Signed)
PATIENT: Derek Carr DOB: 08-Apr-1931  REASON FOR VISIT: follow up HISTORY FROM: patient  HISTORY OF PRESENT ILLNESS: Today 11/25/17:  Derek Carr is an 84 year old male with a history of obstructive sleep apnea on CPAP.  He returns today for follow-up.  His CPAP download indicates that he uses machine 21 out of 30 days for compliance of 70%.  He uses machine greater than 4 hours 19 days for compliance of 63%.  On average he uses his machine 10 hours and 20 minutes each night.  His pressure set between 10 and 14 cm of water.  His average pressure in the 95th percentile is 13.3 cm of water.  His residual AHI is 7.5.  The patient daughter states that she puts the mask on him every night.  The patient states that if the mask is leaking he also will take it off as he is unable to fix the leak.  Daughter also states that he could not use the mask that was provided by [facility].  He has been using an old mask that he had.  He returns today for evaluation.  HISTORY 09-29-2017, Derek Carr is a 84 y.o. male, seen here with his daughter ,after repeated TIA -strokes and syncopes.  He also was feeling no benefit from BiPAP use -he  had been using ASV since 2014. Patient has in the meantime seen Dr. Lovena Le for a loop recorder, and ended up instead with a pacemaker - He had a syncope from bradycardia.  *The patient had actually used a ASV, after he had been diagnosed with complex and mostly central sleep apnea in August 2015, CPAP at low pressures cost central apneas to arise with an AHI of 27.7 and supine sleep there was an accentuation of 34.3, after he failed CPAP and BiPAP ASV was initiated at the time he used a pressure support minimum of 4 maximum of 10 cmH2O and end expiratory pressure of 5 cmH2O.  The AHI reached 0.0 and the patient actually could finally sleep the sleep efficiency rose to 57% from previously only 20% he also had no longer oxygen desaturation he had no PLM's, but he  remained in what I suspected was atrial fibrillation with a variable R to R interval.  Derek Carr now returned first for a home sleep study just to screen if apnea was still present and to work severity.  A watch [name] was used the study was performed on 24 August 2017 after referral by Foye Clock, it revealed complex and dominantly central sleep apnea without hypoxemia.  There was tachycardia and bradycardia noted, the non-REM AHI was higher than the REM sleep AHI indicating the central nature of apnea.  In supine sleep AHI was 53.4 on his left side AHI was 4.2/h.  The patient had endorsed the Epworth score at 20 points FS is at 47 points, BMI was 22.7.  He was asked to return for a titration. The titration to continuous positive airway pressure took place on 23 September 2017 the patient started 55 cm water pressure and was advanced to 13 sleep latency was only 15 minutes and sleep efficiency was 90.7% his central sleep apnea responded well to CPAP at 13 cmH2O was eliminated also snoring and reduced all arousals.  He did not have hypoxemia and he did have a paced EKG rhythm.  He was fitted to a Risk manager and Paykel FFM model Simplus in medium size .   I believe that his pacemaker corrected  the diastolic heart failure and improved response to CPAP.    REVIEW OF SYSTEMS: Out of a complete 14 system review of symptoms, the patient complains only of the following symptoms, and all other reviewed systems are negative.  See HPI  ALLERGIES: Allergies  Allergen Reactions  . Oxycodone Other (See Comments)  . Dilaudid [Hydromorphone Hcl] Other (See Comments)    Confusion   . Methocarbamol Other (See Comments)    Drowsiness   . Percodan [Oxycodone-Aspirin] Other (See Comments)    Confusion   . Tramadol Other (See Comments)    Drowsiness   . Vicodin [Hydrocodone-Acetaminophen] Other (See Comments)    Confusion     HOME MEDICATIONS: Outpatient Medications Prior to Visit  Medication Sig  Dispense Refill  . acetaminophen (TYLENOL) 500 MG tablet Take 1,000 mg by mouth 2 (two) times daily.     . Ascorbic Acid (VITAMIN C) 1000 MG tablet Take 1,000 mg by mouth daily.    Marland Kitchen aspirin EC 81 MG tablet Take 81 mg by mouth daily.    . Calcium Carbonate Antacid (TUMS PO) Take 1 tablet by mouth daily.    . cetirizine (ZYRTEC) 10 MG tablet Take 10 mg by mouth daily.    . Cholecalciferol (VITAMIN D-3) 1000 units CAPS Take 1 capsule by mouth 2 (two) times daily.     . clobetasol cream (TEMOVATE) 5.09 % Apply 1 application topically daily at 12 noon.    . Cyanocobalamin (VITAMIN B12) 1000 MCG TBCR Take 1 tablet by mouth 2 (two) times daily.     . Docosahexaenoic Acid (DHA OMEGA 3 PO) Take 1,000 mg by mouth daily.     Marland Kitchen docusate sodium (COLACE) 100 MG capsule Take 1 capsule (100 mg total) by mouth 2 (two) times daily. While taking narcotic pain medicine. 30 capsule 0  . folic acid (FOLVITE) 1 MG tablet Take 1 mg by mouth daily.    . irbesartan (AVAPRO) 75 MG tablet Take 75 mg by mouth at bedtime. **REPLACES VALSARTAN**  11  . Omega 3-6-9 Fatty Acids (OMEGA DHA PO) Take 1 tablet by mouth at bedtime.     . Probiotic Product (PROBIOTIC PO) Take 1 capsule by mouth daily.     Marland Kitchen senna (SENOKOT) 8.6 MG TABS tablet Take 2 tablets (17.2 mg total) by mouth 2 (two) times daily. 30 each 0  . Ubiquinol (QUNOL COQ10/UBIQUINOL/MEGA) 100 MG CAPS Take 1 tablet by mouth at bedtime.     . valACYclovir (VALTREX) 1000 MG tablet Take 1,000 mg by mouth daily.     No facility-administered medications prior to visit.     PAST MEDICAL HISTORY: Past Medical History:  Diagnosis Date  . AAA (abdominal aortic aneurysm) ([place])   . Anemia   . Ankle fracture    left  . Aortic regurgitation   . Atherosclerosis   . Complication of anesthesia    CONFUSION - Pt's family very concerned about this  . DDD (degenerative disc disease)   . Degenerative arthritis   . Dermatitis, atopic ARMS AND LEGS  . Diverticulosis   .  Erectile dysfunction   . Frequency of urination   . Glaucoma   . History of asbestos exposure   . History of radiation therapy 8/19-13-10/18/11   prostate, 19 GY  . History of shingles 2012-- BILATERAL EYES--  NO RESIDUAL  . Hyperlipidemia   . Hypertension   . Inguinal hernia   . Lumbar scoliosis   . Nocturia   . OA (osteoarthritis) of hip RIGHT  .  Prostate cancer ([place]) 05/11/2011   bx=Adenocarcinoma,gleason3+4=7, 4+4=8,PSA=10.30volume=45.7cc  . Renal cyst    bilateral  . Smokers' cough ([place])   . Stroke ([place])   . Thyroid cyst     PAST SURGICAL HISTORY: Past Surgical History:  Procedure Laterality Date  . ATTEMPTED LEFT VATS/ LEFT THORACOTOMY/ RESECTION OF THE ENORMOUS, PROBABLE BRONCHOGENIC CYST  01-04-2005  DR Arlyce Dice  . CARDIAC CATHETERIZATION  02-24-2009  DR NASHER   MINOR CORONARY ARTERY IRREGULARITIES/ NORMAL LVSF/ EF 65-70%  . CATARACT EXTRACTION    . COLONOSCOPY W/ POLYPECTOMY    . CYSTOSCOPY  11/15/2011   Procedure: CYSTOSCOPY;  Surgeon: Dutch Gray, MD;  Location[facility];  Service: Urology;  Laterality: N/A;  no seeds seen in bladder  . esophageus cyst removal  YRS AGO  . [place]  . ORIF ANKLE FRACTURE Left 05/03/2017  . ORIF ANKLE FRACTURE Left 05/03/2017   Procedure: OPEN REDUCTION INTERNAL FIXATION (ORIF) BIMALLEOLAR  ANKLE FRACTURE;  Surgeon: Wylene Simmer, MD;  Location: [facility];  Service: Orthopedics;  Laterality: Left;  . PACEMAKER IMPLANT N/A 09/16/2017   Procedure: PACEMAKER IMPLANT;  Surgeon: Constance Haw, MD;  Location: [facility];  Service: Cardiovascular;  Laterality: N/A;  . PROSTATE BIOPSY  05/11/11   Adenocarcinoma ([facility])  . RADIOACTIVE SEED IMPLANT  11/15/2011   Procedure: RADIOACTIVE SEED IMPLANT;  Surgeon: Dutch Gray, MD;  [facility];  Service: Urology;  Laterality: N/A;  Total number of seeds - 52  . REPAIR RIGHT INGUINAL HERNIA W/ MESH  09-10-1999  . TOTAL HIP  ARTHROPLASTY Right 03/20/2013   Procedure: RIGHT TOTAL HIP ARTHROPLASTY ANTERIOR APPROACH;  Surgeon: [name], MD;  Location: [facility];  Service: Orthopedics;  Laterality: Right;  . TOTAL HIP REVISION Right 05/14/2013   Procedure: open reduction internal fixation REVISION RIGHT HIP ;  Surgeon: [name], MD;  Location: [facility];  Service: Orthopedics;  Laterality: Right;  . UMBILICAL HERNIA REPAIR  1998   epigastric    FAMILY HISTORY: Family History  Problem Relation Age of Onset  . Hypertension Mother   . Prostate cancer Brother        surgery/cured  . Lung cancer Brother        new primary/same brother    SOCIAL HISTORY: Social History   Socioeconomic History  . Marital status: Married    Spouse name: Alison Murray  . Number of children: 4  . Years of education: College  . Highest education level: Not on file  Occupational History  . Occupation:       Employer: [employer]    Comment: Retired  Scientific laboratory technician  . Financial resource strain: Not on file  . Food insecurity:    Worry: Not on file    Inability: Not on file  . Transportation needs:    Medical: Not on file    Non-medical: Not on file  Tobacco Use  . Smoking status: Former Smoker    Packs/day: 0.50    Years: 62.00    Pack years: 31.00    Types: Cigarettes  . Smokeless tobacco: Never Used  . Tobacco comment: smoke one or two per day  Substance and Sexual Activity  . Alcohol use: No    Alcohol/week: 1.0 standard drinks    Types: 1 Cans of beer per week  . Drug use: No    Comment: quit smoking 02/2012  . Sexual activity: Not on file  Lifestyle  . Physical  activity:    Days per week: Not on file    Minutes per session: Not on file  . Stress: Not on file  Relationships  . Social connections:    Talks on phone: Not on file    Gets together: Not on file    Attends religious service: Not on file    Active member of club or organization: Not on file    Attends meetings of clubs or organizations: Not on  file    Relationship status: Not on file  . Intimate partner violence:    Fear of current or ex partner: Not on file    Emotionally abused: Not on file    Physically abused: Not on file    Forced sexual activity: Not on file  Other Topics Concern  . Not on file  Social History Narrative   Patient is Married Designer, television/film set), and lives at home with his wife 81 years   Patient has 3 daughters and 1 son.   Patient has a college education.   Patient is right-handed.   Patient drinks one cup of coffee, 1-2 cups of tea daily and rarely drinks soda.   Retired from [employer]:   11/25/17 1108  BP: (!) 146/87  Pulse: 93  Weight: 181 lb 6.4 oz (82.3 kg)  Height: 6\' 3"  (1.905 m)   Body mass index is 22.67 kg/m.  Generalized: Well developed, in no acute distress   Neurological examination  Mentation: Alert oriented to time, place, history taking. Follows all commands speech and language fluent Cranial nerve II-XII:  Extraocular movements were full, visual field were full on confrontational test.  Uvula tongue midline. Head turning and shoulder shrug  were normal and symmetric.  Neck circumference 14 inches, Mallampati 3+ Motor: The motor testing reveals 5 over 5 strength of all 4 extremities. Good symmetric motor tone is noted throughout.  Sensory: Sensory testing is intact to soft touch on all 4 extremities. No evidence of extinction is noted.   Gait and station: Patient uses a walker when ambulating.     DIAGNOSTIC DATA (LABS, IMAGING, TESTING) - I reviewed patient records, labs, notes, testing and imaging myself where available.  Lab Results  Component Value Date   WBC 7.1 09/17/2017   HGB 11.7 (L) 09/17/2017   HCT 36.1 (L) 09/17/2017   MCV 84.7 09/17/2017   PLT 162 09/17/2017      Component Value Date/Time   NA 139 09/17/2017 0506   K 3.6 09/17/2017 0506   CL 109 09/17/2017 0506   CO2 19 (L) 09/17/2017 0506   GLUCOSE 88 09/17/2017 0506   BUN 16  09/17/2017 0506   CREATININE 1.24 09/17/2017 0506   CALCIUM 9.3 09/17/2017 0506   PROT 6.9 09/17/2017 0506   ALBUMIN 2.9 (L) 09/17/2017 0506   AST 18 09/17/2017 0506   ALT 15 09/17/2017 0506   ALKPHOS 67 09/17/2017 0506   BILITOT 0.7 09/17/2017 0506   GFRNONAA 52 (L) 09/17/2017 0506   GFRAA >60 09/17/2017 0506   Lab Results  Component Value Date   CHOL 185 09/17/2017   HDL 31 (L) 09/17/2017   LDLCALC 140 (H) 09/17/2017   TRIG 72 09/17/2017   CHOLHDL 6.0 09/17/2017   Lab Results  Component Value Date   HGBA1C 6.1 (H) 03/02/2017   Lab Results  Component Value Date   VITAMINB12 900 03/02/2017   Lab Results  Component Value Date   TSH 1.767 09/15/2017  ASSESSMENT AND PLAN 84 y.o. year old male  has a past medical history of AAA (abdominal aortic aneurysm) ([place]), Anemia, Ankle fracture, Aortic regurgitation, Atherosclerosis, Complication of anesthesia, DDD (degenerative disc disease), Degenerative arthritis, Dermatitis, atopic (ARMS AND LEGS), Diverticulosis, Erectile dysfunction, Frequency of urination, Glaucoma, History of asbestos exposure, History of radiation therapy (8/19-13-10/18/11), History of shingles (2012-- BILATERAL EYES--  NO RESIDUAL), Hyperlipidemia, Hypertension, Inguinal hernia, Lumbar scoliosis, Nocturia, OA (osteoarthritis) of hip (RIGHT), Prostate cancer ([place]) (05/11/2011), Renal cyst, Smokers' cough ([place]), Stroke ([place]), and Thyroid cyst. here with:  1.  Obstructive sleep apnea on CPAP  The patient's download shows suboptimal compliance.  His residual AHI is slightly elevated at 7.5.  We will adjust his pressure to 10 to 15 cm of water.  I have asked [facility] to refit his mask.  Also advised the daughter that the patient needs to be shown how to adjust his mask in the event that she is not there to fix the leak.  She voiced understanding.  Patient will follow-up in 6 months or sooner if needed.   I spent 15 minutes with the patient. 50% of this time was  spent reviewing his CPAP download   Ward Givens, MSN, NP-C 11/25/2017, 11:24 AM [facility] [address] [place], [place] [address] [phone]

## 2017-11-25 NOTE — Patient Instructions (Signed)
Your Plan:  Continue using CPAP nightly and >4 hours each night Change pressure 10-15 cm H20 Mask refitting  Please call [facility] at [phone], and press option 1 when prompted. Their customer service representatives will be glad to assist you. If they are unable to answer, please leave a message and they will call you back. Make sure to leave your name and return phone number.   Thank you for coming to see [place] at [facility]. I hope we have been able to provide you high quality care today.  You may receive a patient satisfaction survey over the next few weeks. We would appreciate your feedback and comments so that we may continue to improve ourselves and the health of our patients.

## 2017-11-27 NOTE — Progress Notes (Signed)
I agree with the above plan 

## 2017-11-28 NOTE — Progress Notes (Signed)
cpap orders to [facility]. Received by Sallee Lange 11/28/2017 sy

## 2017-11-29 ENCOUNTER — Encounter: Payer: Self-pay | Admitting: Psychology

## 2017-11-29 ENCOUNTER — Encounter: Payer: Self-pay | Admitting: Physical Therapy

## 2017-11-29 ENCOUNTER — Ambulatory Visit: Payer: Medicare HMO | Attending: Internal Medicine | Admitting: Physical Therapy

## 2017-11-29 VITALS — BP 139/76 | HR 67

## 2017-11-29 DIAGNOSIS — R2681 Unsteadiness on feet: Secondary | ICD-10-CM

## 2017-11-29 DIAGNOSIS — R42 Dizziness and giddiness: Secondary | ICD-10-CM | POA: Diagnosis not present

## 2017-11-29 DIAGNOSIS — R2689 Other abnormalities of gait and mobility: Secondary | ICD-10-CM | POA: Diagnosis not present

## 2017-11-29 DIAGNOSIS — R278 Other lack of coordination: Secondary | ICD-10-CM | POA: Diagnosis not present

## 2017-11-29 DIAGNOSIS — M6281 Muscle weakness (generalized): Secondary | ICD-10-CM | POA: Diagnosis not present

## 2017-11-30 NOTE — Therapy (Signed)
[facility] [address] [place], [place], [address] Phone: [phone]   Fax:  [phone]  Physical Therapy Treatment  Patient Details  Name: Derek Carr MRN: [id number] Date of Birth: 17-Jun-1931 Referring Provider (PT): Mechele Claude, [place]   Encounter Date: 11/29/2017  PT End of Session - 11/29/17 1740    Visit Number  25    Number of Visits  39    Date for PT Re-Evaluation  01/13/18    Authorization Type  [employer] Medicare & Generic commercial 2nd    Authorization Time Period  Progress Note due visit 23    PT Start Time  1230    PT Stop Time  1315    PT Time Calculation (min)  45 min    Equipment Utilized During Treatment  Gait belt    Activity Tolerance  Patient tolerated treatment well;No increased pain    Behavior During Therapy  WFL for tasks assessed/performed       Past Medical History:  Diagnosis Date  . AAA (abdominal aortic aneurysm) ([place])   . Anemia   . Ankle fracture    left  . Aortic regurgitation   . Atherosclerosis   . Complication of anesthesia    CONFUSION - Pt's family very concerned about this  . DDD (degenerative disc disease)   . Degenerative arthritis   . Dermatitis, atopic ARMS AND LEGS  . Diverticulosis   . Erectile dysfunction   . Frequency of urination   . Glaucoma   . History of asbestos exposure   . History of radiation therapy 8/19-13-10/18/11   prostate, 57 GY  . History of shingles 2012-- BILATERAL EYES--  NO RESIDUAL  . Hyperlipidemia   . Hypertension   . Inguinal hernia   . Lumbar scoliosis   . Nocturia   . OA (osteoarthritis) of hip RIGHT  . Prostate cancer ([place]) 05/11/2011   bx=Adenocarcinoma,gleason3+4=7, 4+4=8,PSA=10.30volume=45.7cc  . Renal cyst    bilateral  . Smokers' cough ([place])   . Stroke ([place])   . Thyroid cyst     Past Surgical History:  Procedure Laterality Date  . ATTEMPTED LEFT VATS/ LEFT THORACOTOMY/ RESECTION OF THE ENORMOUS, PROBABLE  BRONCHOGENIC CYST  01-04-2005  DR Arlyce Dice  . CARDIAC CATHETERIZATION  02-24-2009  DR NASHER   MINOR CORONARY ARTERY IRREGULARITIES/ NORMAL LVSF/ EF 65-70%  . CATARACT EXTRACTION    . COLONOSCOPY W/ POLYPECTOMY    . CYSTOSCOPY  11/15/2011   Procedure: CYSTOSCOPY;  Surgeon: Dutch Gray, MD;  Location: [facility];  Service: Urology;  Laterality: N/A;  no seeds seen in bladder  . esophageus cyst removal  YRS AGO  . [place]  . ORIF ANKLE FRACTURE Left 05/03/2017  . ORIF ANKLE FRACTURE Left 05/03/2017   Procedure: OPEN REDUCTION INTERNAL FIXATION (ORIF) BIMALLEOLAR  ANKLE FRACTURE;  Surgeon: Wylene Simmer, MD;  Location: [facility];  Service: Orthopedics;  Laterality: Left;  . PACEMAKER IMPLANT N/A 09/16/2017   Procedure: PACEMAKER IMPLANT;  Surgeon: Constance Haw, MD;  Location: [facility];  Service: Cardiovascular;  Laterality: N/A;  . PROSTATE BIOPSY  05/11/11   Adenocarcinoma ([facility] OFFICE)  . RADIOACTIVE SEED IMPLANT  11/15/2011   Procedure: RADIOACTIVE SEED IMPLANT;  Surgeon: Dutch Gray, MD;  Location: [facility];  Service: Urology;  Laterality: N/A;  Total number of seeds - 52  . REPAIR RIGHT INGUINAL HERNIA W/ MESH  09-10-1999  . TOTAL HIP ARTHROPLASTY Right 03/20/2013   Procedure:  RIGHT TOTAL HIP ARTHROPLASTY ANTERIOR APPROACH;  Surgeon: [name], MD;  Location: [facility];  Service: Orthopedics;  Laterality: Right;  . TOTAL HIP REVISION Right 05/14/2013   Procedure: open reduction internal fixation REVISION RIGHT HIP ;  Surgeon: [name], MD;  Location: [facility];  Service: Orthopedics;  Laterality: Right;  . UMBILICAL HERNIA REPAIR  1998   epigastric    Vitals:   11/29/17 1240  BP: 139/76  Pulse: 67  SpO2: 98%    Subjective Assessment - 11/29/17 1236    Subjective  No falls. No new "episodes"    Pertinent History  left ankle fracture, TIAs, DDD, Lumbar scoliosis, Glaucoma, cataract surgery, HTN, right THR 03/20/13 with  revision 05/14/13, right femur fracture, dementia/Alzheimers    Limitations  Standing;Walking;House hold activities    Patient Stated Goals  Patient wants to improve his balance & walking    Currently in Pain?  No/denies                       [facility] Adult PT Treatment/Exercise - 11/29/17 1230      Ambulation/Gait   Ambulation/Gait  Yes    Ambulation/Gait Assistance  4: Min guard;5: Supervision    Ambulation/Gait Assistance Details  working on scanning & negotiating obstacles during gait    Ambulation Distance (Feet)  250 Feet    Assistive device  Straight cane    Gait Pattern  Step-through pattern;Decreased stance time - left;Decreased weight shift to left;Lateral hip instability;Wide base of support;Decreased stride length;Decreased step length - left;Poor foot clearance - right    Ambulation Surface  Indoor;Level    Ramp  5: Supervision   cane with quad tip   Ramp Details (indicate cue type and reason)  verbal cues on technique & upright posture    Curb  5: Supervision   cane with quad tip   Curb Details (indicate cue type and reason)  verbal cues on technique including step through pattern & using momentum    Gait Comments  --      High Level Balance   High Level Balance Activities  Side stepping;Backward walking;Head turns;Negotitating around obstacles;Negotiating over obstacles    High Level Balance Comments  tactile & verbal cues on balance reactions & posture          Balance Exercises - 11/29/17 1230      Balance Exercises: Standing   Standing Eyes Opened  Wide (BOA);Head turns;Solid surface;5 reps    Standing Eyes Closed  Wide (BOA);Solid surface;10 secs;2 reps    Stepping Strategy  Anterior;Posterior;Lateral;Foam/compliant surface;5 reps   alt stepping on/stabilze/off; minA/tactile cues   Rockerboard  Anterior/posterior;Lateral;EO;Head turns;5 reps;Intermittent UE support   wt shifts, stabilization, recovery   Heel Raises Limitations  heel/toe raises  x10, 1 UE support    Other Standing Exercises  standing stretches to improve posture: posterior pelvis against counter - upward extension of trunk (Yoga Mountain pose) and trunk rotation right & left with 15 sec hold 3 reps ea.           PT Short Term Goals - 11/29/17 1741      PT SHORT TERM GOAL #1   Title  Pt will be independent with updated HEP exercises (All STGs Target Date 12/16/2017)    Baseline        Time  1    Period  Months    Status  On-going    Target Date  12/16/17      PT  SHORT TERM GOAL #2   Title  Patient ambulates 500' outdoor surfaces including grass with cane with supervision.     Baseline        Time  1    Period  Months    Status  Revised    Target Date  12/16/17      PT SHORT TERM GOAL #3   Title  Timed Up & Go with cane <15sec with supervision.     Baseline        Time  1    Period  Months    Target Date  12/16/17      PT SHORT TERM GOAL #4   Title  Berg Balance >36/56    Baseline         Time  1    Period  Months    Status  New    Target Date  12/16/17      PT SHORT TERM GOAL #5   Title  Patient negotiates ramps & curbs with cane with supervision    Baseline         Time  1    Period  Months    Target Date  12/16/17        PT Long Term Goals - 10/17/17 1822      PT LONG TERM GOAL #1   Title  Pt will demonstrate independence with ongoing HEP program and fitness plan (All LTGs Target Date 01/13/2018)    Time  90    Period  Days    Status  On-going    Target Date  01/13/18      PT LONG TERM GOAL #2   Title  Patient ambulates Modified Independent 500' outdoors including grass, pavement, curbs, ramps with cane to improve community ambulatory skills    Time  90    Period  Days    Status  On-going    Target Date  01/13/18      PT LONG TERM GOAL #3   Title  Berg Balance >/= 45/56 to indicate lower fall risk.     Time  90    Period  Days    Status  On-going    Target Date  01/13/18      PT LONG TERM GOAL #4   Title  Timed  Up-Go time with cane <13.5 seconds to indicate lower fall risk.     Time  90    Period  Days    Status  On-going    Target Date  01/13/18      PT LONG TERM GOAL #5   Title  Dynamic Gait Index with cane >12/24 to indicate lower fall risk.     Time  90    Period  Days    Status  New    Target Date  01/13/18            Plan - 11/29/17 1746    Clinical Impression Statement  Today's skilled PT session focused on balance activities to facilitate ankle, hip & step strategies and gait negotiating obstacles & scanning.     Rehab Potential  Good    Clinical Impairments Affecting Rehab Potential  progressive decline over 2 years, good attitude and family support with 4 children in area.     PT Frequency  2x / week    PT Duration  12 weeks    PT Treatment/Interventions  Gait training;Functional mobility training;Neuromuscular re-education;Balance training;Therapeutic exercise;Therapeutic activities;Patient/family education;ADLs/Self Care Home Management;Stair training;Orthotic Fit/Training;Manual techniques;Vestibular;Canalith Repostioning  PT Next Visit Plan  work towards updated STGs,     Consulted and Agree with Plan of Care  Patient       Patient will benefit from skilled therapeutic intervention in order to improve the following deficits and impairments:  Abnormal gait, Decreased activity tolerance, Decreased balance, Decreased endurance, Decreased knowledge of use of DME, Decreased mobility, Decreased range of motion, Decreased safety awareness, Decreased strength, Impaired flexibility, Postural dysfunction, Dizziness  Visit Diagnosis: Unsteadiness on feet  Other abnormalities of gait and mobility  Muscle weakness (generalized)  Other lack of coordination  Dizziness and giddiness     Problem List Patient Active Problem List   Diagnosis Date Noted  . Bradycardia 09/16/2017  . CKD (chronic kidney disease), stage III ([place]) 09/15/2017  . Near syncope 09/14/2017  .  Post-operative pain   . Dementia without behavioral disturbance ([place])   . TIA (transient ischemic attack)   . Reactive hypertension   . Bimalleolar fracture of left ankle 05/03/2017  . Axonal neuropathy 03/21/2017  . Right sided weakness 03/01/2017  . Cigarette smoker 06/16/2016  . Dyspnea on exertion 06/15/2016  . Alzheimer disease ([place]) 01/07/2014  . CSA (central sleep apnea) 10/05/2013  . Syncope 06/26/2013  . COPD GOLD II 06/25/2013  . BPH (benign prostatic hyperplasia) 05/18/2013  . Glaucoma 05/18/2013  . S/P right TH revision 05/14/2013  . Constipation 03/26/2013  . Osteoporosis 03/26/2013  . S/P right THA, AA 03/20/2013  . Hyperlipidemia   . Erectile dysfunction   . History of asbestos exposure   . Nocturia   . History of shingles   . Dermatitis, atopic   . OA (osteoarthritis) of hip   . Arthritis   . Frequency of urination   . Lumbar scoliosis   . Malignant neoplasm of prostate ([place]) 06/07/2011  . 84 year old gentleman with stage T3 adenocarcinoma prostate with Gleason score 4+4 and PSA of 10.3 05/11/2011    Dimitri Shakespeare PT, DPT 11/30/2017, 8:28 AM  [facility] [address] [place], [place], [address] Phone: [phone]   Fax:  [phone]  Name: Derek Carr MRN: [id number] Date of Birth: 11/10/1931

## 2017-12-02 ENCOUNTER — Ambulatory Visit: Payer: Medicare HMO | Admitting: Physical Therapy

## 2017-12-02 DIAGNOSIS — M6281 Muscle weakness (generalized): Secondary | ICD-10-CM

## 2017-12-02 DIAGNOSIS — R2681 Unsteadiness on feet: Secondary | ICD-10-CM | POA: Diagnosis not present

## 2017-12-02 DIAGNOSIS — R42 Dizziness and giddiness: Secondary | ICD-10-CM | POA: Diagnosis not present

## 2017-12-02 DIAGNOSIS — R278 Other lack of coordination: Secondary | ICD-10-CM | POA: Diagnosis not present

## 2017-12-02 DIAGNOSIS — R2689 Other abnormalities of gait and mobility: Secondary | ICD-10-CM | POA: Diagnosis not present

## 2017-12-02 NOTE — Therapy (Signed)
[facility] [address] [place], [place], [address] Phone: [phone]   Fax:  [phone]  Physical Therapy Treatment  Patient Details  Name: Derek Carr MRN: [id number] Date of Birth: [birth year] Referring Provider (PT): Mechele Claude, [place]   Encounter Date: 12/02/2017  PT End of Session - 12/02/17 1300    Visit Number  26    Number of Visits  39    Date for PT Re-Evaluation  01/13/18    Authorization Type  [employer] Medicare & Generic commercial 2nd    Authorization Time Period  Progress Note due visit 23    PT Start Time  1058    PT Stop Time  1141    PT Time Calculation (min)  43 min    Equipment Utilized During Treatment  Gait belt    Activity Tolerance  Patient tolerated treatment well;No increased pain    Behavior During Therapy  WFL for tasks assessed/performed       Past Medical History:  Diagnosis Date  . AAA (abdominal aortic aneurysm) ([place])   . Anemia   . Ankle fracture    left  . Aortic regurgitation   . Atherosclerosis   . Complication of anesthesia    CONFUSION - Pt's family very concerned about this  . DDD (degenerative disc disease)   . Degenerative arthritis   . Dermatitis, atopic ARMS AND LEGS  . Diverticulosis   . Erectile dysfunction   . Frequency of urination   . Glaucoma   . History of asbestos exposure   . History of radiation therapy 8/19-13-10/18/11   prostate, 89 GY  . History of shingles 2012-- BILATERAL EYES--  NO RESIDUAL  . Hyperlipidemia   . Hypertension   . Inguinal hernia   . Lumbar scoliosis   . Nocturia   . OA (osteoarthritis) of hip RIGHT  . Prostate cancer ([place]) 05/11/2011   bx=Adenocarcinoma,gleason3+4=7, 4+4=8,PSA=10.30volume=45.7cc  . Renal cyst    bilateral  . Smokers' cough ([place])   . Stroke ([place])   . Thyroid cyst     Past Surgical History:  Procedure Laterality Date  . ATTEMPTED LEFT VATS/ LEFT THORACOTOMY/ RESECTION OF THE ENORMOUS, PROBABLE  BRONCHOGENIC CYST  01-04-2005  DR Arlyce Dice  . CARDIAC CATHETERIZATION  02-24-2009  DR NASHER   MINOR CORONARY ARTERY IRREGULARITIES/ NORMAL LVSF/ EF 65-70%  . CATARACT EXTRACTION    . COLONOSCOPY W/ POLYPECTOMY    . CYSTOSCOPY  11/15/2011   Procedure: CYSTOSCOPY;  Surgeon: Dutch Gray, MD;  Location: [facility];  Service: Urology;  Laterality: N/A;  no seeds seen in bladder  . esophageus cyst removal  YRS AGO  . [place]  . ORIF ANKLE FRACTURE Left 05/03/2017  . ORIF ANKLE FRACTURE Left 05/03/2017   Procedure: OPEN REDUCTION INTERNAL FIXATION (ORIF) BIMALLEOLAR  ANKLE FRACTURE;  Surgeon: Wylene Simmer, MD;  Location: [facility];  Service: Orthopedics;  Laterality: Left;  . PACEMAKER IMPLANT N/A 09/16/2017   Procedure: PACEMAKER IMPLANT;  Surgeon: Constance Haw, MD;  Location: [facility];  Service: Cardiovascular;  Laterality: N/A;  . PROSTATE BIOPSY  05/11/11   Adenocarcinoma ([facility] OFFICE)  . RADIOACTIVE SEED IMPLANT  11/15/2011   Procedure: RADIOACTIVE SEED IMPLANT;  Surgeon: Dutch Gray, MD;  Location[facility];  Service: Urology;  Laterality: N/A;  Total number of seeds - 52  . REPAIR RIGHT INGUINAL HERNIA W/ MESH  09-10-1999  . TOTAL HIP ARTHROPLASTY Right 03/20/2013   Procedure:  RIGHT TOTAL HIP ARTHROPLASTY ANTERIOR APPROACH;  Surgeon: [name], MD;  [facility];  Service: Orthopedics;  Laterality: Right;  . TOTAL HIP REVISION Right 05/14/2013   Procedure: open reduction internal fixation REVISION RIGHT HIP ;  Surgeon: [name], MD;  [facility];  Service: Orthopedics;  Laterality: Right;  . UMBILICAL HERNIA REPAIR  1998   epigastric    There were no vitals filed for this visit.  Subjective Assessment - 12/02/17 1100    Subjective  No falls. No changes at all.    Pertinent History  left ankle fracture, TIAs, DDD, Lumbar scoliosis, Glaucoma, cataract surgery, HTN, right THR 03/20/13 with revision 05/14/13, right  femur fracture, dementia/Alzheimers    Patient Stated Goals  Patient wants to improve his balance & walking    Currently in Pain?  No/denies       [facility] Adult PT Treatment/Exercise - 12/02/17 1247      Ambulation/Gait   Ambulation/Gait  Yes    Ambulation/Gait Assistance  4: Min guard;5: Supervision;4: Min assist    Ambulation/Gait Assistance Details  Presented with unsteadiness, veering at times; LOB x 1 due to toe catch needing min assist to prevent fall., VCs for posutre    Ambulation Distance (Feet)  115 Feet   x1, 150x1+ over red mat on floor for unlevel surfaces   Assistive device  Straight cane    Gait Pattern  Step-through pattern;Decreased stance time - left;Decreased weight shift to left;Lateral hip instability;Wide base of support;Decreased stride length;Decreased step length - left;Poor foot clearance - right    Ambulation Surface  Level;Unlevel;Indoor    Ramp  5: Supervision    Ramp Details (indicate cue type and reason)  increased effort and time, VCs for posture    Curb  4: Min assist;5: Supervision    Curb Details (indicate cue type and reason)  VCs for safety and sequence, pt required min assist for LOB due to not clearing curb while decending   x2   Gait Comments  Pt demonstrated safe ambuation initially but became unsteady due to fatigue. VCs for posture and to increase DF during gait.       High Level Balance   High Level Balance Activities  Side stepping;Backward walking;Head turns;Negotitating around obstacles;Negotiating over obstacles;Other (comment)    High Level Balance Comments  Stepping strategy (fwd) from airex BIL x10 requiring VCs for sequence and use of R UE; rockerboard anterior/posterior/lateral with static balance and progressed to weight shifitng requiring min assist to maintain balance; ambulating with head turns while ambuating in clinic requiring supervision for unsteadiness; Patient required use of one UE throughout balance activities and required 3  seated rest breaks due to fatigue.            PT Short Term Goals - 11/29/17 1741      PT SHORT TERM GOAL #1   Title  Pt will be independent with updated HEP exercises (All STGs Target Date 12/16/2017)    Baseline        Time  1    Period  Months    Status  On-going    Target Date  12/16/17      PT SHORT TERM GOAL #2   Title  Patient ambulates 500' outdoor surfaces including grass with cane with supervision.     Baseline        Time  1    Period  Months    Status  Revised    Target Date  12/16/17  PT SHORT TERM GOAL #3   Title  Timed Up & Go with cane <15sec with supervision.     Baseline        Time  1    Period  Months    Target Date  12/16/17      PT SHORT TERM GOAL #4   Title  Berg Balance >36/56    Baseline         Time  1    Period  Months    Status  New    Target Date  12/16/17      PT SHORT TERM GOAL #5   Title  Patient negotiates ramps & curbs with cane with supervision    Baseline         Time  1    Period  Months    Target Date  12/16/17        PT Long Term Goals - 10/17/17 1822      PT LONG TERM GOAL #1   Title  Pt will demonstrate independence with ongoing HEP program and fitness plan (All LTGs Target Date 01/13/2018)    Time  90    Period  Days    Status  On-going    Target Date  01/13/18      PT LONG TERM GOAL #2   Title  Patient ambulates Modified Independent 500' outdoors including grass, pavement, curbs, ramps with cane to improve community ambulatory skills    Time  90    Period  Days    Status  On-going    Target Date  01/13/18      PT LONG TERM GOAL #3   Title  Berg Balance >/= 45/56 to indicate lower fall risk.     Time  90    Period  Days    Status  On-going    Target Date  01/13/18      PT LONG TERM GOAL #4   Title  Timed Up-Go time with cane <13.5 seconds to indicate lower fall risk.     Time  90    Period  Days    Status  On-going    Target Date  01/13/18      PT LONG TERM GOAL #5   Title  Dynamic Gait  Index with cane >12/24 to indicate lower fall risk.     Time  90    Period  Days    Status  New    Target Date  01/13/18            Plan - 12/02/17 1301    Clinical Impression Statement  Todays treatment continued to address patients balance and activity tolerance deficits. He initially demonstrated improved balance from previous treatments but became unsteady due to fatigue. Patient would benefit from further physical therapy to increase balance, activity tolerance, and adress gait deviations.    Clinical Presentation  Evolving    Clinical Decision Making  Moderate    Rehab Potential  Good    Clinical Impairments Affecting Rehab Potential  progressive decline over 2 years, good attitude and family support with 4 children in area.     PT Frequency  2x / week    PT Duration  12 weeks    PT Treatment/Interventions  Gait training;Functional mobility training;Neuromuscular re-education;Balance training;Therapeutic exercise;Therapeutic activities;Patient/family education;ADLs/Self Care Home Management;Stair training;Orthotic Fit/Training;Manual techniques;Vestibular;Canalith Repostioning    PT Next Visit Plan  work towards updated STGs, balance, gait, and activity tolerance    Consulted and Agree with  Plan of Care  Patient       Patient will benefit from skilled therapeutic intervention in order to improve the following deficits and impairments:  Abnormal gait, Decreased activity tolerance, Decreased balance, Decreased endurance, Decreased knowledge of use of DME, Decreased mobility, Decreased range of motion, Decreased safety awareness, Decreased strength, Impaired flexibility, Postural dysfunction, Dizziness  Visit Diagnosis: Other abnormalities of gait and mobility  Unsteadiness on feet  Muscle weakness (generalized)  Other lack of coordination     Problem List Patient Active Problem List   Diagnosis Date Noted  . Bradycardia 09/16/2017  . CKD (chronic kidney disease),  stage III ([place]) 09/15/2017  . Near syncope 09/14/2017  . Post-operative pain   . Dementia without behavioral disturbance ([place])   . TIA (transient ischemic attack)   . Reactive hypertension   . Bimalleolar fracture of left ankle 05/03/2017  . Axonal neuropathy 03/21/2017  . Right sided weakness 03/01/2017  . Cigarette smoker 06/16/2016  . Dyspnea on exertion 06/15/2016  . Alzheimer disease ([place]) 01/07/2014  . CSA (central sleep apnea) 10/05/2013  . Syncope 06/26/2013  . COPD GOLD II 06/25/2013  . BPH (benign prostatic hyperplasia) 05/18/2013  . Glaucoma 05/18/2013  . S/P right TH revision 05/14/2013  . Constipation 03/26/2013  . Osteoporosis 03/26/2013  . S/P right THA, AA 03/20/2013  . Hyperlipidemia   . Erectile dysfunction   . History of asbestos exposure   . Nocturia   . History of shingles   . Dermatitis, atopic   . OA (osteoarthritis) of hip   . Arthritis   . Frequency of urination   . Lumbar scoliosis   . Malignant neoplasm of prostate ([place]) 06/07/2011  . 84 year old gentleman with stage T3 adenocarcinoma prostate with Gleason score 4+4 and PSA of 10.3 05/11/2011    Tyechia Allmendinger SPTA 12/02/2017, 1:07 PM  [facility] [address] [place], [place], [address] Phone: [phone]   Fax:  [phone]  Name: ARCHIT LEGER MRN: [id number] Date of Birth: [birth year]

## 2017-12-06 ENCOUNTER — Ambulatory Visit: Payer: Medicare HMO | Admitting: Rehabilitative and Restorative Service Providers"

## 2017-12-06 ENCOUNTER — Encounter: Payer: Self-pay | Admitting: Rehabilitative and Restorative Service Providers"

## 2017-12-06 ENCOUNTER — Ambulatory Visit: Payer: PRIVATE HEALTH INSURANCE | Admitting: Adult Health

## 2017-12-06 DIAGNOSIS — R42 Dizziness and giddiness: Secondary | ICD-10-CM | POA: Diagnosis not present

## 2017-12-06 DIAGNOSIS — R278 Other lack of coordination: Secondary | ICD-10-CM | POA: Diagnosis not present

## 2017-12-06 DIAGNOSIS — R2689 Other abnormalities of gait and mobility: Secondary | ICD-10-CM | POA: Diagnosis not present

## 2017-12-06 DIAGNOSIS — M6281 Muscle weakness (generalized): Secondary | ICD-10-CM

## 2017-12-06 DIAGNOSIS — R2681 Unsteadiness on feet: Secondary | ICD-10-CM

## 2017-12-06 NOTE — Therapy (Signed)
[facility] [address] [place], [place], [address] Phone: [phone]   Fax:  [phone]  Physical Therapy Treatment  Patient Details  Name: Derek Carr MRN: [id number] Date of Birth: 1931/04/22 Referring Provider (PT): Mechele Claude, [place]   Encounter Date: 12/06/2017  PT End of Session - 12/06/17 1153    Visit Number  27    Number of Visits  39    Date for PT Re-Evaluation  01/13/18    Authorization Type  [employer] Medicare & Generic commercial 2nd    Authorization Time Period  Progress Note due visit 23    PT Start Time  1105    PT Stop Time  1145    PT Time Calculation (min)  40 min    Equipment Utilized During Treatment  Gait belt    Activity Tolerance  Patient tolerated treatment well;No increased pain    Behavior During Therapy  WFL for tasks assessed/performed       Past Medical History:  Diagnosis Date  . AAA (abdominal aortic aneurysm) ([place])   . Anemia   . Ankle fracture    left  . Aortic regurgitation   . Atherosclerosis   . Complication of anesthesia    CONFUSION - Pt's family very concerned about this  . DDD (degenerative disc disease)   . Degenerative arthritis   . Dermatitis, atopic ARMS AND LEGS  . Diverticulosis   . Erectile dysfunction   . Frequency of urination   . Glaucoma   . History of asbestos exposure   . History of radiation therapy 8/19-13-10/18/11   prostate, 69 GY  . History of shingles 2012-- BILATERAL EYES--  NO RESIDUAL  . Hyperlipidemia   . Hypertension   . Inguinal hernia   . Lumbar scoliosis   . Nocturia   . OA (osteoarthritis) of hip RIGHT  . Prostate cancer ([place]) 05/11/2011   bx=Adenocarcinoma,gleason3+4=7, 4+4=8,PSA=10.30volume=45.7cc  . Renal cyst    bilateral  . Smokers' cough ([place])   . Stroke ([place])   . Thyroid cyst     Past Surgical History:  Procedure Laterality Date  . ATTEMPTED LEFT VATS/ LEFT THORACOTOMY/ RESECTION OF THE ENORMOUS, PROBABLE  BRONCHOGENIC CYST  01-04-2005  DR Arlyce Dice  . CARDIAC CATHETERIZATION  02-24-2009  DR NASHER   MINOR CORONARY ARTERY IRREGULARITIES/ NORMAL LVSF/ EF 65-70%  . CATARACT EXTRACTION    . COLONOSCOPY W/ POLYPECTOMY    . CYSTOSCOPY  11/15/2011   Procedure: CYSTOSCOPY;  Surgeon: Dutch Gray, MD;  Location: [facility];  Service: Urology;  Laterality: N/A;  no seeds seen in bladder  . esophageus cyst removal  YRS AGO  . [place]  . ORIF ANKLE FRACTURE Left 05/03/2017  . ORIF ANKLE FRACTURE Left 05/03/2017   Procedure: OPEN REDUCTION INTERNAL FIXATION (ORIF) BIMALLEOLAR  ANKLE FRACTURE;  Surgeon: Wylene Simmer, MD;  Location: [facility];  Service: Orthopedics;  Laterality: Left;  . PACEMAKER IMPLANT N/A 09/16/2017   Procedure: PACEMAKER IMPLANT;  Surgeon: Constance Haw, MD;  Location: [facility];  Service: Cardiovascular;  Laterality: N/A;  . PROSTATE BIOPSY  05/11/11   Adenocarcinoma ([facility])  . RADIOACTIVE SEED IMPLANT  11/15/2011   Procedure: RADIOACTIVE SEED IMPLANT;  Surgeon: Dutch Gray, MD;  Location: [facility];  Service: Urology;  Laterality: N/A;  Total number of seeds - 52  . REPAIR RIGHT INGUINAL HERNIA W/ MESH  09-10-1999  . TOTAL HIP ARTHROPLASTY Right 03/20/2013   Procedure:  RIGHT TOTAL HIP ARTHROPLASTY ANTERIOR APPROACH;  Surgeon: [name], MD;  Location: [facility];  Service: Orthopedics;  Laterality: Right;  . TOTAL HIP REVISION Right 05/14/2013   Procedure: open reduction internal fixation REVISION RIGHT HIP ;  Surgeon: [name], MD;  Location: [facility];  Service: Orthopedics;  Laterality: Right;  . UMBILICAL HERNIA REPAIR  1998   epigastric    There were no vitals filed for this visit.  Subjective Assessment - 12/06/17 1107    Subjective  The patient walks with the walker in the house-- he notes use of the walker x 4-5 months.      Pertinent History  left ankle fracture, TIAs, DDD, Lumbar scoliosis, Glaucoma, cataract  surgery, HTN, right THR 03/20/13 with revision 05/14/13, right femur fracture, dementia/Alzheimers    Patient Stated Goals  Patient wants to improve his balance & walking    Currently in Pain?  No/denies         [facility] PT Assessment - 12/06/17 1108      Standardized Balance Assessment   Standardized Balance Assessment  Berg Balance Test      Berg Balance Test   Sit to Stand  Able to stand  independently using hands    Standing Unsupported  Able to stand safely 2 minutes    Sitting with Back Unsupported but Feet Supported on Floor or Stool  Able to sit safely and securely 2 minutes    Stand to Sit  Sits safely with minimal use of hands    Transfers  Able to transfer safely, definite need of hands    Standing Unsupported with Eyes Closed  Able to stand 10 seconds with supervision    Standing Ubsupported with Feet Together  Able to place feet together independently and stand for 1 minute with supervision    From Standing, Reach Forward with Outstretched Arm  Can reach forward >5 cm safely (2")    From Standing Position, Pick up Object from Floor  Able to pick up shoe, needs supervision    From Standing Position, Turn to Look Behind Over each Shoulder  Turn sideways only but maintains balance    Turn 360 Degrees  Able to turn 360 degrees safely but slowly    Standing Unsupported, Alternately Place Feet on Step/Stool  Able to complete >2 steps/needs minimal assist    Standing Unsupported, One Foot in Front  Able to take small step independently and hold 30 seconds    Standing on One Leg  Unable to try or needs assist to prevent fall    Total Score  36    Berg comment:  36/56 improved from 22/56.                   [facility] Adult PT Treatment/Exercise - 12/06/17 1108      Ambulation/Gait   Ambulation/Gait  Yes    Ambulation/Gait Assistance  5: Supervision;4: Min guard    Ambulation/Gait Assistance Details  Patient arrived with RW and then transitioned in session to ambulate with  SPC with CGA .  PT added in challenges of obstacles, tight spaces, turns, and encouraged longer stride length for improved toe clearance.    Ambulation Distance (Feet)  230 Feet   x 6 reps   Assistive device  Rolling walker;Straight cane    Gait Pattern  Step-through pattern;Decreased stance time - left;Decreased weight shift to left;Lateral hip instability;Wide base of support;Decreased stride length;Decreased step length - left;Poor foot clearance - right  Ambulation Surface  Level;Indoor      High Level Balance   High Level Balance Activities  Figure 8 turns;Negotitating around obstacles;Negotiating over obstacles;Turns    High Level Balance Comments  Patient negotiated 2 chairs for figure 8 pattern with inttermitently sitting and returning to stand for transitions during functional tasks; performed walking around obstacles and tight spaces created by chairs and tables to mimic home furniture and smaller space.  Performed stepping over canes for improved foot clearance.       Neuro Re-ed    Neuro Re-ed Details   Stepping over object with R and L LEs with CGA to min A for support.  PT working on encouraging longer stride length.       Exercises   Exercises  Knee/Hip      Knee/Hip Exercises: Stretches   Active Hamstring Stretch  Right;Left;2 reps;30 seconds    Hip Flexor Stretch  Right;Left;30 seconds;1 rep    Gastroc Stretch  Right;Left;2 reps;30 seconds      Knee/Hip Exercises: Standing   Heel Raises  5 reps    Heel Raises Limitations  added a heel raise duirng sit<>stand without device with CGA working on powering up tall and adding heel raise.    Other Standing Knee Exercises  weight shifting laterally R<>L in wide lunge position with HHA.               PT Short Term Goals - 12/06/17 1138      PT SHORT TERM GOAL #1   Title  Pt will be independent with updated HEP exercises (All STGs Target Date 12/16/2017)    Baseline        Time  1    Period  Months    Status   On-going    Target Date  12/16/17      PT SHORT TERM GOAL #2   Title  Patient ambulates 500' outdoor surfaces including grass with cane with supervision.     Baseline        Time  1    Period  Months    Status  On-going      PT SHORT TERM GOAL #3   Title  Timed Up & Go with cane <15sec with supervision.     Baseline        Time  1    Period  Months    Status  On-going      PT SHORT TERM GOAL #4   Title  Berg Balance >36/56    Baseline  Improved from 22/56 (on 10/17/17) up to 36/56 12/06/2017    Time  1    Period  Months    Status  Partially Met      PT SHORT TERM GOAL #5   Title  Patient negotiates ramps & curbs with cane with supervision    Baseline         Time  1    Period  Months    Status  On-going        PT Long Term Goals - 10/17/17 1822      PT LONG TERM GOAL #1   Title  Pt will demonstrate independence with ongoing HEP program and fitness plan (All LTGs Target Date 01/13/2018)    Time  90    Period  Days    Status  On-going    Target Date  01/13/18      PT LONG TERM GOAL #2   Title  Patient ambulates Modified Independent 500'  outdoors including grass, pavement, curbs, ramps with cane to improve community ambulatory skills    Time  90    Period  Days    Status  On-going    Target Date  01/13/18      PT LONG TERM GOAL #3   Title  Berg Balance >/= 45/56 to indicate lower fall risk.     Time  90    Period  Days    Status  On-going    Target Date  01/13/18      PT LONG TERM GOAL #4   Title  Timed Up-Go time with cane <13.5 seconds to indicate lower fall risk.     Time  90    Period  Days    Status  On-going    Target Date  01/13/18      PT LONG TERM GOAL #5   Title  Dynamic Gait Index with cane >12/24 to indicate lower fall risk.     Time  90    Period  Days    Status  New    Target Date  01/13/18            Plan - 12/06/17 1251    Clinical Impression Statement  The patient has improved Berg score from 22/56 up to 36/56.  He did well  today with SPC use around obstacles and in tight spaces.  PT to continue to check STGs progressing functional mobility.      PT Treatment/Interventions  Gait training;Functional mobility training;Neuromuscular re-education;Balance training;Therapeutic exercise;Therapeutic activities;Patient/family education;ADLs/Self Care Home Management;Stair training;Orthotic Fit/Training;Manual techniques;Vestibular;Canalith Repostioning    PT Next Visit Plan  Check short term goals, gait, balance, functional mobility with SPC    Consulted and Agree with Plan of Care  Patient       Patient will benefit from skilled therapeutic intervention in order to improve the following deficits and impairments:  Abnormal gait, Decreased activity tolerance, Decreased balance, Decreased endurance, Decreased knowledge of use of DME, Decreased mobility, Decreased range of motion, Decreased safety awareness, Decreased strength, Impaired flexibility, Postural dysfunction, Dizziness  Visit Diagnosis: Other abnormalities of gait and mobility  Unsteadiness on feet  Muscle weakness (generalized)     Problem List Patient Active Problem List   Diagnosis Date Noted  . Bradycardia 09/16/2017  . CKD (chronic kidney disease), stage III ([place]) 09/15/2017  . Near syncope 09/14/2017  . Post-operative pain   . Dementia without behavioral disturbance ([place])   . TIA (transient ischemic attack)   . Reactive hypertension   . Bimalleolar fracture of left ankle 05/03/2017  . Axonal neuropathy 03/21/2017  . Right sided weakness 03/01/2017  . Cigarette smoker 06/16/2016  . Dyspnea on exertion 06/15/2016  . Alzheimer disease ([place]) 01/07/2014  . CSA (central sleep apnea) 10/05/2013  . Syncope 06/26/2013  . COPD GOLD II 06/25/2013  . BPH (benign prostatic hyperplasia) 05/18/2013  . Glaucoma 05/18/2013  . S/P right TH revision 05/14/2013  . Constipation 03/26/2013  . Osteoporosis 03/26/2013  . S/P right THA, AA 03/20/2013  .  Hyperlipidemia   . Erectile dysfunction   . History of asbestos exposure   . Nocturia   . History of shingles   . Dermatitis, atopic   . OA (osteoarthritis) of hip   . Arthritis   . Frequency of urination   . Lumbar scoliosis   . Malignant neoplasm of prostate ([place]) 06/07/2011  . 84 year old gentleman with stage T3 adenocarcinoma prostate with Gleason score 4+4 and PSA of 10.3 05/11/2011  [place], PT 12/06/2017, 12:54 PM  [facility] [address] [place], [place], [address] Phone: [phone]   Fax:  [phone]  Name: HULBERT BRANSCOME MRN: [id number] Date of Birth: Nov 11, 1931

## 2017-12-07 ENCOUNTER — Telehealth: Payer: Self-pay | Admitting: Adult Health

## 2017-12-07 NOTE — Telephone Encounter (Signed)
Called number listed in phone note, [phone] and received VM from "[employer] Did not leave a message.  Called number for daughter, [name] on [place]. Call not answered, mailbox full so unable to LVM.

## 2017-12-07 NOTE — Telephone Encounter (Signed)
Pts daughter(unable to verify name)requesting a call back at [phone] to discuss the aid Np had mentioned to help pt put his CPAP mask on

## 2017-12-08 ENCOUNTER — Encounter: Payer: Self-pay | Admitting: Physical Therapy

## 2017-12-08 ENCOUNTER — Ambulatory Visit: Payer: Medicare HMO | Admitting: Physical Therapy

## 2017-12-08 DIAGNOSIS — R2681 Unsteadiness on feet: Secondary | ICD-10-CM

## 2017-12-08 DIAGNOSIS — M6281 Muscle weakness (generalized): Secondary | ICD-10-CM

## 2017-12-08 DIAGNOSIS — R42 Dizziness and giddiness: Secondary | ICD-10-CM | POA: Diagnosis not present

## 2017-12-08 DIAGNOSIS — R2689 Other abnormalities of gait and mobility: Secondary | ICD-10-CM

## 2017-12-08 DIAGNOSIS — R278 Other lack of coordination: Secondary | ICD-10-CM | POA: Diagnosis not present

## 2017-12-08 NOTE — Therapy (Signed)
[facility] [address] [place], [place], [address] Phone: [phone]   Fax:  [phone]  Physical Therapy Treatment  Patient Details  Name: Derek Carr MRN: [id number] Date of Birth: 1931/09/05 Referring Provider (PT): Mechele Claude, [place]   Encounter Date: 12/08/2017  PT End of Session - 12/08/17 1157    Visit Number  28    Number of Visits  39    Date for PT Re-Evaluation  01/13/18    Authorization Type  [employer]na Medicare & Generic commercial 2nd    Authorization Time Period  Progress Note due visit 23    PT Start Time  1058    PT Stop Time  1141    PT Time Calculation (min)  43 min    Equipment Utilized During Treatment  Gait belt    Activity Tolerance  Patient tolerated treatment well;No increased pain    Behavior During Therapy  WFL for tasks assessed/performed       Past Medical History:  Diagnosis Date  . AAA (abdominal aortic aneurysm) ([place])   . Anemia   . Ankle fracture    left  . Aortic regurgitation   . Atherosclerosis   . Complication of anesthesia    CONFUSION - Pt's family very concerned about this  . DDD (degenerative disc disease)   . Degenerative arthritis   . Dermatitis, atopic ARMS AND LEGS  . Diverticulosis   . Erectile dysfunction   . Frequency of urination   . Glaucoma   . History of asbestos exposure   . History of radiation therapy 8/19-13-10/18/11   prostate, 70 GY  . History of shingles 2012-- BILATERAL EYES--  NO RESIDUAL  . Hyperlipidemia   . Hypertension   . Inguinal hernia   . Lumbar scoliosis   . Nocturia   . OA (osteoarthritis) of hip RIGHT  . Prostate cancer ([place]) 05/11/2011   bx=Adenocarcinoma,gleason3+4=7, 4+4=8,PSA=10.30volume=45.7cc  . Renal cyst    bilateral  . Smokers' cough ([place])   . Stroke ([place])   . Thyroid cyst     Past Surgical History:  Procedure Laterality Date  . ATTEMPTED LEFT VATS/ LEFT THORACOTOMY/ RESECTION OF THE ENORMOUS, PROBABLE  BRONCHOGENIC CYST  01-04-2005  DR Arlyce Dice  . CARDIAC CATHETERIZATION  02-24-2009  DR NASHER   MINOR CORONARY ARTERY IRREGULARITIES/ NORMAL LVSF/ EF 65-70%  . CATARACT EXTRACTION    . COLONOSCOPY W/ POLYPECTOMY    . CYSTOSCOPY  11/15/2011   Procedure: CYSTOSCOPY;  Surgeon: Dutch Gray, MD;  Location: [facility];  Service: Urology;  Laterality: N/A;  no seeds seen in bladder  . esophageus cyst removal  YRS AGO  . [place]  . ORIF ANKLE FRACTURE Left 05/03/2017  . ORIF ANKLE FRACTURE Left 05/03/2017   Procedure: OPEN REDUCTION INTERNAL FIXATION (ORIF) BIMALLEOLAR  ANKLE FRACTURE;  Surgeon: Wylene Simmer, MD;  Location: [facility];  Service: Orthopedics;  Laterality: Left;  . PACEMAKER IMPLANT N/A 09/16/2017   Procedure: PACEMAKER IMPLANT;  Surgeon: Constance Haw, MD;  Location: [facility];  Service: Cardiovascular;  Laterality: N/A;  . PROSTATE BIOPSY  05/11/11   Adenocarcinoma ([facility] OFFICE)  . RADIOACTIVE SEED IMPLANT  11/15/2011   Procedure: RADIOACTIVE SEED IMPLANT;  Surgeon: Dutch Gray, MD;  Location: [facility];  Service: Urology;  Laterality: N/A;  Total number of seeds - 52  . REPAIR RIGHT INGUINAL HERNIA W/ MESH  09-10-1999  . TOTAL HIP ARTHROPLASTY Right 03/20/2013   Procedure:  RIGHT TOTAL HIP ARTHROPLASTY ANTERIOR APPROACH;  Surgeon: [name], MD;  [facility];  Service: Orthopedics;  Laterality: Right;  . TOTAL HIP REVISION Right 05/14/2013   Procedure: open reduction internal fixation REVISION RIGHT HIP ;  Surgeon: [name], MD;  [facility];  Service: Orthopedics;  Laterality: Right;  . UMBILICAL HERNIA REPAIR  1998   epigastric    There were no vitals filed for this visit.  Subjective Assessment - 12/08/17 1100    Subjective  No falls. No "episodes"    Pertinent History  left ankle fracture, TIAs, DDD, Lumbar scoliosis, Glaucoma, cataract surgery, HTN, right THR 03/20/13 with revision 05/14/13, right femur  fracture, dementia/Alzheimers    Limitations  Standing;Walking;House hold activities    Patient Stated Goals  Patient wants to improve his balance & walking    Currently in Pain?  Yes    Pain Score  3     Pain Location  Ankle    Pain Orientation  Left    Pain Descriptors / Indicators  Aching    Pain Type  Chronic pain    Pain Frequency  Intermittent    Aggravating Factors   weather changes                       [facility] Adult PT Treatment/Exercise - 12/08/17 1145      Transfers   Transfers  Sit to Stand;Stand to Sit    Sit to Stand  5: Supervision;4: Min guard    Sit to Stand Details  Verbal cues for sequencing;Verbal cues for technique    Sit to Stand Details (indicate cue type and reason)  To and from chair wtih armrests to cane    Stand to Sit  5: Supervision    Stand to Sit Details (indicate cue type and reason)  Verbal cues for precautions/safety   Verbal cues to back up closer to chair and not hold RW.   Comments  Pt became min assist due to R knee pain towards end of treatment.      Ambulation/Gait   Ambulation/Gait  Yes    Ambulation/Gait Assistance  5: Supervision;4: Min guard    Ambulation Distance (Feet)  115 Feet    Assistive device  Rolling walker;Straight cane    Gait Pattern  Step-through pattern;Decreased stance time - left;Decreased weight shift to left;Lateral hip instability;Wide base of support;Decreased stride length;Decreased step length - left;Poor foot clearance - right    Ambulation Surface  Level;Unlevel;Other (comment)   compliant surface in // bars with 1 UE   Gait Comments  Pt navigated through clinic with SPC at supervision level. VCs given for upright posture. Pt became fatigued towards end of treatment and L knee pain increased.      High Level Balance   High Level Balance Activities  Negotitating around obstacles;Negotiating over obstacles;Turns;Side stepping;Other (comment)    High Level Balance Comments  In // bars, pt performed  marching while on compliant surface 10 reps each; rockerboard  anterior/posterior/lateral with static balance progressing to weight shifting for 30 sec x2 each; 5x laps on compliant surface; stepping strategy while standing on compliant surface to level surface BLE anterior/posterior; standing balance with eyes open and feet apart 30 sec x2; standing balance with eyes closed; tandem standing balance;    Use of R UE intermittantly to simulate use of cane,     Neuro Re-ed    Neuro Re-ed Details   Stepping over object with R  and L LEs with CGA to min A for support.  PT working on encouraging longer stride length.  Progressed from on floor to on blue mat for added challenge of compliant surface.         PT Short Term Goals - 12/06/17 1138      PT SHORT TERM GOAL #1   Title  Pt will be independent with updated HEP exercises (All STGs Target Date 12/16/2017)    Baseline        Time  1    Period  Months    Status  On-going    Target Date  12/16/17      PT SHORT TERM GOAL #2   Title  Patient ambulates 500' outdoor surfaces including grass with cane with supervision.     Baseline        Time  1    Period  Months    Status  On-going      PT SHORT TERM GOAL #3   Title  Timed Up & Go with cane <15sec with supervision.     Baseline        Time  1    Period  Months    Status  On-going      PT SHORT TERM GOAL #4   Title  Berg Balance >36/56    Baseline  Improved from 22/56 (on 10/17/17) up to 36/56 12/06/2017    Time  1    Period  Months    Status  Partially Met      PT SHORT TERM GOAL #5   Title  Patient negotiates ramps & curbs with cane with supervision    Baseline         Time  1    Period  Months    Status  On-going        PT Long Term Goals - 10/17/17 1822      PT LONG TERM GOAL #1   Title  Pt will demonstrate independence with ongoing HEP program and fitness plan (All LTGs Target Date 01/13/2018)    Time  90    Period  Days    Status  On-going    Target Date   01/13/18      PT LONG TERM GOAL #2   Title  Patient ambulates Modified Independent 500' outdoors including grass, pavement, curbs, ramps with cane to improve community ambulatory skills    Time  90    Period  Days    Status  On-going    Target Date  01/13/18      PT LONG TERM GOAL #3   Title  Berg Balance >/= 45/56 to indicate lower fall risk.     Time  90    Period  Days    Status  On-going    Target Date  01/13/18      PT LONG TERM GOAL #4   Title  Timed Up-Go time with cane <13.5 seconds to indicate lower fall risk.     Time  90    Period  Days    Status  On-going    Target Date  01/13/18      PT LONG TERM GOAL #5   Title  Dynamic Gait Index with cane >12/24 to indicate lower fall risk.     Time  90    Period  Days    Status  New    Target Date  01/13/18  Plan - 12/08/17 1157    Clinical Impression Statement  Treatment today focued on balancing activities and gait with SPC. Pt required two rest breaks due to fatigue and increase knee pain. Patient would benefit from further therapy to increase balance and funtional independence.    Clinical Presentation  Evolving    Clinical Decision Making  Moderate    Rehab Potential  Good    Clinical Impairments Affecting Rehab Potential  progressive decline over 2 years, good attitude and family support with 4 children in area.     PT Frequency  2x / week    PT Duration  12 weeks    PT Treatment/Interventions  Gait training;Functional mobility training;Neuromuscular re-education;Balance training;Therapeutic exercise;Therapeutic activities;Patient/family education;ADLs/Self Care Home Management;Stair training;Orthotic Fit/Training;Manual techniques;Vestibular;Canalith Repostioning    PT Next Visit Plan  Check short term goals, gait, balance, functional mobility with SPC    Consulted and Agree with Plan of Care  Patient       Patient will benefit from skilled therapeutic intervention in order to improve the following  deficits and impairments:  Abnormal gait, Decreased activity tolerance, Decreased balance, Decreased endurance, Decreased knowledge of use of DME, Decreased mobility, Decreased range of motion, Decreased safety awareness, Decreased strength, Impaired flexibility, Postural dysfunction, Dizziness  Visit Diagnosis: Other abnormalities of gait and mobility  Muscle weakness (generalized)  Unsteadiness on feet     Problem List Patient Active Problem List   Diagnosis Date Noted  . Bradycardia 09/16/2017  . CKD (chronic kidney disease), stage III ([place]) 09/15/2017  . Near syncope 09/14/2017  . Post-operative pain   . Dementia without behavioral disturbance ([place])   . TIA (transient ischemic attack)   . Reactive hypertension   . Bimalleolar fracture of left ankle 05/03/2017  . Axonal neuropathy 03/21/2017  . Right sided weakness 03/01/2017  . Cigarette smoker 06/16/2016  . Dyspnea on exertion 06/15/2016  . Alzheimer disease ([place]) 01/07/2014  . CSA (central sleep apnea) 10/05/2013  . Syncope 06/26/2013  . COPD GOLD II 06/25/2013  . BPH (benign prostatic hyperplasia) 05/18/2013  . Glaucoma 05/18/2013  . S/P right TH revision 05/14/2013  . Constipation 03/26/2013  . Osteoporosis 03/26/2013  . S/P right THA, AA 03/20/2013  . Hyperlipidemia   . Erectile dysfunction   . History of asbestos exposure   . Nocturia   . History of shingles   . Dermatitis, atopic   . OA (osteoarthritis) of hip   . Arthritis   . Frequency of urination   . Lumbar scoliosis   . Malignant neoplasm of prostate ([place]) 06/07/2011  . 84 year old gentleman with stage T3 adenocarcinoma prostate with Gleason score 4+4 and PSA of 10.3 05/11/2011    Barbara Ahart SPTA 12/08/2017, 2:26 PM  [facility] [address] [place], [place], [address] Phone: [phone]   Fax:  [phone]  Name: Derek Carr MRN: [id number] Date of Birth: [birth year]

## 2017-12-09 DIAGNOSIS — Z471 Aftercare following joint replacement surgery: Secondary | ICD-10-CM | POA: Diagnosis not present

## 2017-12-09 DIAGNOSIS — G4731 Primary central sleep apnea: Secondary | ICD-10-CM | POA: Diagnosis not present

## 2017-12-09 DIAGNOSIS — R531 Weakness: Secondary | ICD-10-CM | POA: Diagnosis not present

## 2017-12-09 DIAGNOSIS — S82842A Displaced bimalleolar fracture of left lower leg, initial encounter for closed fracture: Secondary | ICD-10-CM | POA: Diagnosis not present

## 2017-12-09 DIAGNOSIS — R351 Nocturia: Secondary | ICD-10-CM | POA: Diagnosis not present

## 2017-12-09 DIAGNOSIS — R0602 Shortness of breath: Secondary | ICD-10-CM | POA: Diagnosis not present

## 2017-12-09 DIAGNOSIS — G8918 Other acute postprocedural pain: Secondary | ICD-10-CM | POA: Diagnosis not present

## 2017-12-12 ENCOUNTER — Ambulatory Visit: Payer: Medicare HMO | Admitting: Physical Therapy

## 2017-12-12 DIAGNOSIS — R2689 Other abnormalities of gait and mobility: Secondary | ICD-10-CM

## 2017-12-12 DIAGNOSIS — R2681 Unsteadiness on feet: Secondary | ICD-10-CM

## 2017-12-12 DIAGNOSIS — M6281 Muscle weakness (generalized): Secondary | ICD-10-CM | POA: Diagnosis not present

## 2017-12-12 DIAGNOSIS — R42 Dizziness and giddiness: Secondary | ICD-10-CM | POA: Diagnosis not present

## 2017-12-12 DIAGNOSIS — R278 Other lack of coordination: Secondary | ICD-10-CM | POA: Diagnosis not present

## 2017-12-12 NOTE — Therapy (Signed)
[facility] [address] [place], [place], [address] Phone: [phone]   Fax:  [phone]  Physical Therapy Treatment  Patient Details  Name: Derek Carr MRN: [id number] Date of Birth: 09-02-1931 Referring Provider (PT): Mechele Claude, [place]   Encounter Date: 12/12/2017  PT End of Session - 12/12/17 1235    Visit Number  29    Number of Visits  39    Date for PT Re-Evaluation  01/13/18    Authorization Type  [employer] Medicare & Generic commercial 2nd    Authorization Time Period  Progress Note due visit 23    PT Start Time  1100    PT Stop Time  1144    PT Time Calculation (min)  44 min    Equipment Utilized During Treatment  Gait belt    Activity Tolerance  Patient tolerated treatment well;No increased pain    Behavior During Therapy  WFL for tasks assessed/performed       Past Medical History:  Diagnosis Date  . AAA (abdominal aortic aneurysm) ([place])   . Anemia   . Ankle fracture    left  . Aortic regurgitation   . Atherosclerosis   . Complication of anesthesia    CONFUSION - Pt's family very concerned about this  . DDD (degenerative disc disease)   . Degenerative arthritis   . Dermatitis, atopic ARMS AND LEGS  . Diverticulosis   . Erectile dysfunction   . Frequency of urination   . Glaucoma   . History of asbestos exposure   . History of radiation therapy 8/19-13-10/18/11   prostate, 39 GY  . History of shingles 2012-- BILATERAL EYES--  NO RESIDUAL  . Hyperlipidemia   . Hypertension   . Inguinal hernia   . Lumbar scoliosis   . Nocturia   . OA (osteoarthritis) of hip RIGHT  . Prostate cancer ([place]) 05/11/2011   bx=Adenocarcinoma,gleason3+4=7, 4+4=8,PSA=10.30volume=45.7cc  . Renal cyst    bilateral  . Smokers' cough ([place])   . Stroke ([place])   . Thyroid cyst     Past Surgical History:  Procedure Laterality Date  . ATTEMPTED LEFT VATS/ LEFT THORACOTOMY/ RESECTION OF THE ENORMOUS, PROBABLE  BRONCHOGENIC CYST  01-04-2005  DR Arlyce Dice  . CARDIAC CATHETERIZATION  02-24-2009  DR NASHER   MINOR CORONARY ARTERY IRREGULARITIES/ NORMAL LVSF/ EF 65-70%  . CATARACT EXTRACTION    . COLONOSCOPY W/ POLYPECTOMY    . CYSTOSCOPY  11/15/2011   Procedure: CYSTOSCOPY;  Surgeon: Dutch Gray, MD;  [facility];  Service: Urology;  Laterality: N/A;  no seeds seen in bladder  . esophageus cyst removal  YRS AGO  . Hydro  . ORIF ANKLE FRACTURE Left 05/03/2017  . ORIF ANKLE FRACTURE Left 05/03/2017   Procedure: OPEN REDUCTION INTERNAL FIXATION (ORIF) BIMALLEOLAR  ANKLE FRACTURE;  Surgeon: Wylene Simmer, MD;  Location: [facility];  Service: Orthopedics;  Laterality: Left;  . PACEMAKER IMPLANT N/A 09/16/2017   Procedure: PACEMAKER IMPLANT;  Surgeon: Constance Haw, MD;  Location: [facility];  Service: Cardiovascular;  Laterality: N/A;  . PROSTATE BIOPSY  05/11/11   Adenocarcinoma ([facility] OFFICE)  . RADIOACTIVE SEED IMPLANT  11/15/2011   Procedure: RADIOACTIVE SEED IMPLANT;  Surgeon: Dutch Gray, MD;  Location: [facility];  Service: Urology;  Laterality: N/A;  Total number of seeds - 52  . REPAIR RIGHT INGUINAL HERNIA W/ MESH  09-10-1999  . TOTAL HIP ARTHROPLASTY Right 03/20/2013   Procedure:  RIGHT TOTAL HIP ARTHROPLASTY ANTERIOR APPROACH;  Surgeon: [name], MD;  [facility];  Service: Orthopedics;  Laterality: Right;  . TOTAL HIP REVISION Right 05/14/2013   Procedure: open reduction internal fixation REVISION RIGHT HIP ;  Surgeon: [name], MD;  Location[facility];  Service: Orthopedics;  Laterality: Right;  . UMBILICAL HERNIA REPAIR  1998   epigastric    There were no vitals filed for this visit.  Subjective Assessment - 12/12/17 1105    Subjective  No falls and has not been dizzy.    Limitations  Standing;Walking;House hold activities    Patient Stated Goals  Patient wants to improve his balance & walking    Currently in  Pain?  Yes    Pain Score  3     Pain Location  Ankle    Pain Orientation  Left    Pain Descriptors / Indicators  Aching    Pain Type  Chronic pain    Pain Frequency  Intermittent    Aggravating Factors   walking         [facility] PT Assessment - 12/12/17 1127      Berg Balance Test   Sit to Stand  Able to stand  independently using hands    Standing Unsupported  Able to stand safely 2 minutes    Sitting with Back Unsupported but Feet Supported on Floor or Stool  Able to sit safely and securely 2 minutes    Stand to Sit  Sits safely with minimal use of hands    Transfers  Able to transfer safely, definite need of hands    Standing Unsupported with Eyes Closed  Able to stand 10 seconds with supervision    Standing Ubsupported with Feet Together  Able to place feet together independently and stand 1 minute safely    From Standing, Reach Forward with Outstretched Arm  Can reach forward >5 cm safely (2")    From Standing Position, Pick up Object from Floor  Able to pick up shoe, needs supervision    From Standing Position, Turn to Look Behind Over each Shoulder  Looks behind one side only/other side shows less weight shift    Turn 360 Degrees  Able to turn 360 degrees safely but slowly    Standing Unsupported, Alternately Place Feet on Step/Stool  Able to complete >2 steps/needs minimal assist    Standing Unsupported, One Foot in Front  Able to take small step independently and hold 30 seconds    Standing on One Leg  Tries to lift leg/unable to hold 3 seconds but remains standing independently    Total Score  39    Berg comment:  39/59 improved from 36/56      Timed Up and Go Test   Normal TUG (seconds)  21.25    TUG Comments  With cane          [facility] Adult PT Treatment/Exercise - 12/12/17 1230      Transfers   Transfers  Sit to Stand;Stand to Sit    Sit to Stand  5: Supervision;4: Min guard    Sit to Stand Details  Verbal cues for sequencing;Verbal cues for technique    Stand to  Sit  5: Supervision    Stand to Sit Details (indicate cue type and reason)  Verbal cues for precautions/safety   Verbal cues to get back up closer to chair and not hold RW.   Comments  Pt performed  Ambulation/Gait   Ambulation/Gait  Yes    Ambulation/Gait Assistance  5: Supervision;4: Min guard    Ambulation Distance (Feet)  350 Feet   150x1   Assistive device  Straight cane    Gait Pattern  Step-through pattern;Decreased stance time - left;Decreased weight shift to left;Lateral hip instability;Wide base of support;Decreased stride length;Decreased step length - left;Poor foot clearance - right    Ambulation Surface  Level;Indoor    Ramp  5: Supervision    Ramp Details (indicate cue type and reason)  VCs for use of cane    Curb  5: Supervision    Gait Comments  Pt became fatigued and unsteady after attempting 500 feet of outdoor ambulation needing to rest in outside lobby. Pt would intermittently pick cane up and not use.. Pt also presented with decreased foot clearance and an increased of a shuffling gait today.           PT Short Term Goals - 12/12/17 1142      PT SHORT TERM GOAL #1   Title  Pt will be independent with updated HEP exercises (All STGs Target Date 12/16/2017)    Time  1    Period  Months    Status  On-going      PT SHORT TERM GOAL #2   Title  Patient ambulates 500' outdoor surfaces including grass with cane with supervision.     Baseline  12/12/2016 Pt required seated rest break in the lobby due to instability requiring min assist to maintain balance.    Time  1    Period  Months    Status  Not Met      PT SHORT TERM GOAL #3   Title  Timed Up & Go with cane <15sec with supervision.     Baseline  11/18 21.25 sec with cane    Status  Not Met      PT SHORT TERM GOAL #4   Title  Berg Balance >36/56    Baseline  11/18 39/56, imroved from 36/56    Time  1    Period  Months    Status  Achieved      PT SHORT TERM GOAL #5   Title  Patient negotiates  ramps & curbs with cane with supervision    Baseline  12/12/2017 Pt performed ramps/curbs with supervision.    Time  1    Period  Months    Status  Achieved       PT Long Term Goals - 10/17/17 1822      PT LONG TERM GOAL #1   Title  Pt will demonstrate independence with ongoing HEP program and fitness plan (All LTGs Target Date 01/13/2018)    Time  90    Period  Days    Status  On-going    Target Date  01/13/18      PT LONG TERM GOAL #2   Title  Patient ambulates Modified Independent 500' outdoors including grass, pavement, curbs, ramps with cane to improve community ambulatory skills    Time  90    Period  Days    Status  On-going    Target Date  01/13/18      PT LONG TERM GOAL #3   Title  Berg Balance >/= 45/56 to indicate lower fall risk.     Time  90    Period  Days    Status  On-going    Target Date  01/13/18      PT  LONG TERM GOAL #4   Title  Timed Up-Go time with cane <13.5 seconds to indicate lower fall risk.     Time  90    Period  Days    Status  On-going    Target Date  01/13/18      PT LONG TERM GOAL #5   Title  Dynamic Gait Index with cane >12/24 to indicate lower fall risk.     Time  90    Period  Days    Status  New    Target Date  01/13/18            Plan - 12/12/17 1624    Clinical Impression Statement  Treatment focused on checking STGs today. Pt is making progress but was unable to reach ambulation goal and TUG goal. Pt would benefit from further therapy to increase balance, activity tolerance, and functional indpendence.    Rehab Potential  Good    Clinical Impairments Affecting Rehab Potential  progressive decline over 2 years, good attitude and family support with 4 children in area.     PT Frequency  2x / week    PT Duration  12 weeks    PT Next Visit Plan   gait, balance, functional mobility with SPC    Consulted and Agree with Plan of Care  Patient       Patient will benefit from skilled therapeutic intervention in order to  improve the following deficits and impairments:  Abnormal gait, Decreased activity tolerance, Decreased balance, Decreased endurance, Decreased knowledge of use of DME, Decreased mobility, Decreased range of motion, Decreased safety awareness, Decreased strength, Impaired flexibility, Postural dysfunction, Dizziness  Visit Diagnosis: Other abnormalities of gait and mobility  Muscle weakness (generalized)  Unsteadiness on feet     Problem List Patient Active Problem List   Diagnosis Date Noted  . Bradycardia 09/16/2017  . CKD (chronic kidney disease), stage III ([place]) 09/15/2017  . Near syncope 09/14/2017  . Post-operative pain   . Dementia without behavioral disturbance ([place])   . TIA (transient ischemic attack)   . Reactive hypertension   . Bimalleolar fracture of left ankle 05/03/2017  . Axonal neuropathy 03/21/2017  . Right sided weakness 03/01/2017  . Cigarette smoker 06/16/2016  . Dyspnea on exertion 06/15/2016  . Alzheimer disease ([place]) 01/07/2014  . CSA (central sleep apnea) 10/05/2013  . Syncope 06/26/2013  . COPD GOLD II 06/25/2013  . BPH (benign prostatic hyperplasia) 05/18/2013  . Glaucoma 05/18/2013  . S/P right TH revision 05/14/2013  . Constipation 03/26/2013  . Osteoporosis 03/26/2013  . S/P right THA, AA 03/20/2013  . Hyperlipidemia   . Erectile dysfunction   . History of asbestos exposure   . Nocturia   . History of shingles   . Dermatitis, atopic   . OA (osteoarthritis) of hip   . Arthritis   . Frequency of urination   . Lumbar scoliosis   . Malignant neoplasm of prostate ([place]) 06/07/2011  . 84 year old gentleman with stage T3 adenocarcinoma prostate with Gleason score 4+4 and PSA of 10.3 05/11/2011    Derek Carr SPTA 12/12/2017, 4:26 PM  [facility] [address] [place], [place], [address] Phone: [phone]   Fax:  [phone]  Name: Derek Carr MRN: [id number] Date of  Birth: 21-Jun-1931

## 2017-12-13 ENCOUNTER — Ambulatory Visit: Payer: PRIVATE HEALTH INSURANCE | Admitting: Adult Health

## 2017-12-15 ENCOUNTER — Ambulatory Visit: Payer: Medicare HMO | Admitting: Physical Therapy

## 2017-12-15 DIAGNOSIS — R278 Other lack of coordination: Secondary | ICD-10-CM | POA: Diagnosis not present

## 2017-12-15 DIAGNOSIS — M6281 Muscle weakness (generalized): Secondary | ICD-10-CM | POA: Diagnosis not present

## 2017-12-15 DIAGNOSIS — R42 Dizziness and giddiness: Secondary | ICD-10-CM | POA: Diagnosis not present

## 2017-12-15 DIAGNOSIS — R2681 Unsteadiness on feet: Secondary | ICD-10-CM

## 2017-12-15 DIAGNOSIS — R2689 Other abnormalities of gait and mobility: Secondary | ICD-10-CM | POA: Diagnosis not present

## 2017-12-15 NOTE — Therapy (Signed)
[facility] [address] [place], [place], [address] Phone: [phone]   Fax:  [phone]  Physical Therapy Treatment  Patient Details  Name: Derek Carr MRN: [id number] Date of Birth: [birth year] Referring Provider (PT): Mechele Claude, [place]   Encounter Date: 12/15/2017  PT End of Session - 12/15/17 1231    Visit Number  30    Number of Visits  39    Date for PT Re-Evaluation  01/13/18    Authorization Type  [employer] Medicare & Generic commercial 2nd    Authorization Time Period  Progress Note due visit 23    PT Start Time  1104    PT Stop Time  1145    PT Time Calculation (min)  41 min    Equipment Utilized During Treatment  Gait belt    Activity Tolerance  Patient tolerated treatment well;No increased pain    Behavior During Therapy  WFL for tasks assessed/performed       Past Medical History:  Diagnosis Date  . AAA (abdominal aortic aneurysm) ([place])   . Anemia   . Ankle fracture    left  . Aortic regurgitation   . Atherosclerosis   . Complication of anesthesia    CONFUSION - Pt's family very concerned about this  . DDD (degenerative disc disease)   . Degenerative arthritis   . Dermatitis, atopic ARMS AND LEGS  . Diverticulosis   . Erectile dysfunction   . Frequency of urination   . Glaucoma   . History of asbestos exposure   . History of radiation therapy 8/19-13-10/18/11   prostate, 84 GY  . History of shingles 2012-- BILATERAL EYES--  NO RESIDUAL  . Hyperlipidemia   . Hypertension   . Inguinal hernia   . Lumbar scoliosis   . Nocturia   . OA (osteoarthritis) of hip RIGHT  . Prostate cancer ([place]) 05/11/2011   bx=Adenocarcinoma,gleason3+4=7, 4+4=8,PSA=10.30volume=45.7cc  . Renal cyst    bilateral  . Smokers' cough ([place])   . Stroke ([place])   . Thyroid cyst     Past Surgical History:  Procedure Laterality Date  . ATTEMPTED LEFT VATS/ LEFT THORACOTOMY/ RESECTION OF THE ENORMOUS, PROBABLE  BRONCHOGENIC CYST  01-04-2005  DR Arlyce Dice  . CARDIAC CATHETERIZATION  02-24-2009  DR NASHER   MINOR CORONARY ARTERY IRREGULARITIES/ NORMAL LVSF/ EF 65-70%  . CATARACT EXTRACTION    . COLONOSCOPY W/ POLYPECTOMY    . CYSTOSCOPY  11/15/2011   Procedure: CYSTOSCOPY;  Surgeon: Dutch Gray, MD;  Location: [facility];  Service: Urology;  Laterality: N/A;  no seeds seen in bladder  . esophageus cyst removal  YRS AGO  . [place]  . ORIF ANKLE FRACTURE Left 05/03/2017  . ORIF ANKLE FRACTURE Left 05/03/2017   Procedure: OPEN REDUCTION INTERNAL FIXATION (ORIF) BIMALLEOLAR  ANKLE FRACTURE;  Surgeon: Wylene Simmer, MD;  Location: [facility];  Service: Orthopedics;  Laterality: Left;  . PACEMAKER IMPLANT N/A 09/16/2017   Procedure: PACEMAKER IMPLANT;  Surgeon: Constance Haw, MD;  Location: [facility];  Service: Cardiovascular;  Laterality: N/A;  . PROSTATE BIOPSY  05/11/11   Adenocarcinoma ([facility] OFFICE)  . RADIOACTIVE SEED IMPLANT  11/15/2011   Procedure: RADIOACTIVE SEED IMPLANT;  Surgeon: Dutch Gray, MD;  Location: [facility];  Service: Urology;  Laterality: N/A;  Total number of seeds - 52  . REPAIR RIGHT INGUINAL HERNIA W/ MESH  09-10-1999  . TOTAL HIP ARTHROPLASTY Right 03/20/2013   Procedure:  RIGHT TOTAL HIP ARTHROPLASTY ANTERIOR APPROACH;  Surgeon: [name], MD;  [facility];  Service: Orthopedics;  Laterality: Right;  . TOTAL HIP REVISION Right 05/14/2013   Procedure: open reduction internal fixation REVISION RIGHT HIP ;  Surgeon: [name], MD;  [facility];  Service: Orthopedics;  Laterality: Right;  . UMBILICAL HERNIA REPAIR  1998   epigastric    There were no vitals filed for this visit.  Subjective Assessment - 12/15/17 1113    Subjective  He has not had any falls or changes. Dizziness has continues to not be as bad.    Patient is accompained by:  Family member    Pertinent History  left ankle fracture, TIAs, DDD,  Lumbar scoliosis, Glaucoma, cataract surgery, HTN, right THR 03/20/13 with revision 05/14/13, right femur fracture, dementia/Alzheimers    Limitations  Standing;Walking;House hold activities    Patient Stated Goals  Patient wants to improve his balance & walking    Currently in Pain?  No/denies           [facility] Adult PT Treatment/Exercise - 12/15/17 1114      Ambulation/Gait   Ambulation/Gait  Yes    Ambulation/Gait Assistance  4: Min assist;4: Min guard    Ambulation/Gait Assistance Details  Pt required VCs to slow down and improve posture due to instability requiring min assist to correct balance    Ambulation Distance (Feet)  230 Feet   115x1   Assistive device  Straight cane    Gait Pattern  Step-through pattern;Decreased stance time - left;Decreased weight shift to left;Lateral hip instability;Wide base of support;Decreased stride length;Decreased step length - left;Poor foot clearance - right    Ambulation Surface  Level;Indoor    Ramp  --    Curb  --    Gait Comments  Pt demonstrated improved use of cane today but became unsteady multiple times during gait.  After balance exercises, pt ambulated through clinic with increased stability with VCs for decreased speed and improved posture. He became tired 2 laps around clinic requiring a seated rest break.      High Level Balance   High Level Balance Activities  Other (comment)    High Level Balance Comments  In// bars: Pt performed static standing balance while standing on compliant surface with wide BOS progressing to narrow BOS with head turns and then EC for 30 sec each; patient performed standing marches while on compliant surfaces x10 reps; performed stepping strategy anterior/posterion while standing on compliant surface x10 each; Pt performed fwd steps with VCs for controlled movement x10 reps each; Pt performed tandem walkingx 5 laps and retro gait for x5 laps. Pt required use of UE with VCs for posture and decrease UE use. Pt was  min assist throughout balance activities.               PT Short Term Goals - 12/12/17 1142      PT SHORT TERM GOAL #1   Title  Pt will be independent with updated HEP exercises (All STGs Target Date 12/16/2017)    Time  1    Period  Months    Status  On-going      PT SHORT TERM GOAL #2   Title  Patient ambulates 500' outdoor surfaces including grass with cane with supervision.     Baseline  12/12/2016 Pt required seated rest break in the lobby due to instability requiring min assist to maintain balance.    Time  1  Period  Months    Status  Not Met      PT SHORT TERM GOAL #3   Title  Timed Up & Go with cane <15sec with supervision.     Baseline  11/18 21.25 sec with cane    Status  Not Met      PT SHORT TERM GOAL #4   Title  Berg Balance >36/56    Baseline  11/18 39/56, imroved from 36/56    Time  1    Period  Months    Status  Partially Met      PT SHORT TERM GOAL #5   Title  Patient negotiates ramps & curbs with cane with supervision    Baseline  12/12/2017 Pt performed ramps/curbs with supervision.    Time  1    Period  Months    Status  On-going        PT Long Term Goals - 10/17/17 1822      PT LONG TERM GOAL #1   Title  Pt will demonstrate independence with ongoing HEP program and fitness plan (All LTGs Target Date 01/13/2018)    Time  90    Period  Days    Status  On-going    Target Date  01/13/18      PT LONG TERM GOAL #2   Title  Patient ambulates Modified Independent 500' outdoors including grass, pavement, curbs, ramps with cane to improve community ambulatory skills    Time  90    Period  Days    Status  On-going    Target Date  01/13/18      PT LONG TERM GOAL #3   Title  Berg Balance >/= 45/56 to indicate lower fall risk.     Time  90    Period  Days    Status  On-going    Target Date  01/13/18      PT LONG TERM GOAL #4   Title  Timed Up-Go time with cane <13.5 seconds to indicate lower fall risk.     Time  90    Period  Days     Status  On-going    Target Date  01/13/18      PT LONG TERM GOAL #5   Title  Dynamic Gait Index with cane >12/24 to indicate lower fall risk.     Time  90    Period  Days    Status  New    Target Date  01/13/18            Plan - 12/15/17 1232    Clinical Impression Statement  Treatment today focused on improving gait and high level balance actvities. Pt demonstrated increased safety with ambulation towards end of treatment. His RW height was adjusted and should improve posture and decrease heavy UE use. Pt would benefit from further physical therapy to increase balance and gait deficits.    Clinical Presentation  Evolving    Clinical Decision Making  Moderate    Rehab Potential  Good    Clinical Impairments Affecting Rehab Potential  progressive decline over 2 years, good attitude and family support with 4 children in area.     PT Frequency  2x / week    PT Duration  12 weeks    PT Treatment/Interventions  Gait training;Functional mobility training;Neuromuscular re-education;Balance training;Therapeutic exercise;Therapeutic activities;Patient/family education;ADLs/Self Care Home Management;Stair training;Orthotic Fit/Training;Manual techniques;Vestibular;Canalith Repostioning    PT Next Visit Plan   gait, balance, functional mobility with SPC  Consulted and Agree with Plan of Care  Patient    Family Member Consulted  daughter       Patient will benefit from skilled therapeutic intervention in order to improve the following deficits and impairments:  Abnormal gait, Decreased activity tolerance, Decreased balance, Decreased endurance, Decreased knowledge of use of DME, Decreased mobility, Decreased range of motion, Decreased safety awareness, Decreased strength, Impaired flexibility, Postural dysfunction, Dizziness  Visit Diagnosis: Other abnormalities of gait and mobility  Muscle weakness (generalized)  Unsteadiness on feet     Problem List Patient Active Problem List    Diagnosis Date Noted  . Bradycardia 09/16/2017  . CKD (chronic kidney disease), stage III ([place]) 09/15/2017  . Near syncope 09/14/2017  . Post-operative pain   . Dementia without behavioral disturbance ([place])   . TIA (transient ischemic attack)   . Reactive hypertension   . Bimalleolar fracture of left ankle 05/03/2017  . Axonal neuropathy 03/21/2017  . Right sided weakness 03/01/2017  . Cigarette smoker 06/16/2016  . Dyspnea on exertion 06/15/2016  . Alzheimer disease ([place]) 01/07/2014  . CSA (central sleep apnea) 10/05/2013  . Syncope 06/26/2013  . COPD GOLD II 06/25/2013  . BPH (benign prostatic hyperplasia) 05/18/2013  . Glaucoma 05/18/2013  . S/P right TH revision 05/14/2013  . Constipation 03/26/2013  . Osteoporosis 03/26/2013  . S/P right THA, AA 03/20/2013  . Hyperlipidemia   . Erectile dysfunction   . History of asbestos exposure   . Nocturia   . History of shingles   . Dermatitis, atopic   . OA (osteoarthritis) of hip   . Arthritis   . Frequency of urination   . Lumbar scoliosis   . Malignant neoplasm of prostate ([place]) 06/07/2011  . 84 year old gentleman with stage T3 adenocarcinoma prostate with Gleason score 4+4 and PSA of 10.3 05/11/2011    Cyprian Gongaware SPTA 12/15/2017, 12:37 PM  [facility] [address] [place], [place], [address] Phone: [phone]   Fax:  [phone]  Name: EDAHI KROENING MRN: [id number] Date of Birth: Nov 11, 1931

## 2017-12-16 ENCOUNTER — Ambulatory Visit (INDEPENDENT_AMBULATORY_CARE_PROVIDER_SITE_OTHER): Payer: Medicare HMO | Admitting: Internal Medicine

## 2017-12-16 ENCOUNTER — Encounter: Payer: Self-pay | Admitting: Internal Medicine

## 2017-12-16 DIAGNOSIS — R69 Illness, unspecified: Secondary | ICD-10-CM | POA: Diagnosis not present

## 2017-12-16 DIAGNOSIS — F17210 Nicotine dependence, cigarettes, uncomplicated: Secondary | ICD-10-CM

## 2017-12-16 DIAGNOSIS — J449 Chronic obstructive pulmonary disease, unspecified: Secondary | ICD-10-CM

## 2017-12-16 MED ORDER — UMECLIDINIUM-VILANTEROL 62.5-25 MCG/INH IN AEPB: INHALATION_SPRAY | RESPIRATORY_TRACT | 11 refills | Status: DC

## 2017-12-16 MED ORDER — FLUTICASONE-UMECLIDIN-VILANT 100-62.5-25 MCG/INH IN AEPB: 1.0000 | INHALATION_SPRAY | Freq: Every day | RESPIRATORY_TRACT | 0 refills | Status: DC

## 2017-12-16 NOTE — Patient Instructions (Addendum)
Try trelegy on click each am x 2 good drags  x 2 weeks and if improves your activity tolerance then fill rx for Anoro one click x 2 pffs each am   .Please schedule a follow up visit in 6 months but call sooner if needed  with all medications /inhalers/ solutions in hand so we can verify exactly what you are taking. This includes all medications from all doctors and over the counters

## 2017-12-16 NOTE — Progress Notes (Signed)
Subjective:     Patient ID: Derek Carr, male   DOB: Jul 04, 1931   MRN: [id number]    Brief patient profile:  70 yobm active smoker  and not limited by sob at that point with having spells of hypersomnolence since orthopedic surgery R hip replaced  referred by Dr Joylene Draft to pulmonary clinic 06/25/2013  for ? Hypercarbia contributing with GOLD II criteri 06/2013 confirmed / unchanged 06/16/2016    History of Present Illness  06/25/2013 1st [facility] office visit/ Kenneth Lax  3 spells x 2 months lasting 5 min at most then resolve s sequelae assoc with low bp per [name] daugher/CCU nurse - better when lie down and get both legs up so stopped flomax since last episode one week prior to OV  And no spells since stopped the flomax (which he says didn't help his urination anyway)  - occur sitting up assoc with drowsiness first  - never needs saba to break a spell or aware of sob before or after a spell.  rec I would not start the flomax back if you don't think it helped you urinate Check blood pressure sitting and standing daily and let Dr Joylene Draft know if it's dropping more than 20 points systolic  You have GOLD II COPD Mild/moderate severity and will never progress unless you resume smoking so stick with it!     06/15/2016  f/u ov/Iyania Denne re: GOLD II copd / preop eval  Chief Complaint  Patient presents with  . Pulmonary Consult    Self referral for total left hip replacement.  He states he has SOB only when he walks long distances. He states able to do the things he wants without his breathing stopping him.   sleeps flat / no am flares of cough / congestion  He denies limiting sob/ did not use anoro from dr Joylene Draft enough to know whether it helped but mostly limited by L hip  rec Clean air/ use anoro if helps sob   12/16/2017  f/u ov/Axel Frisk re:  GOLD II copd / still smoking some / not on maint rx  Chief Complaint  Patient presents with  . Follow-up    Pt states he feels his breathing is doing well. He  states that Dr Joylene Draft rec that he f/u here. Pt states he gets tired easily.   Dyspnea:  Sob during therapy / flat surface around building  Doe x 100 ft  But also limited by gait/balance and peripheral neuropathy and pain  L ankle - goal is to walk the neighborhood  Cough: no Sleeping: flat / one pillows  SABA use: none 02:  None     No obvious day to day or daytime variability or assoc excess/ purulent sputum or mucus plugs or hemoptysis or cp or chest tightness, subjective wheeze or overt sinus or hb symptoms.   Sleeping as above  without nocturnal  or early am exacerbation  of respiratory  c/o's or need for noct saba. Also denies any obvious fluctuation of symptoms with weather or environmental changes or other aggravating or alleviating factors except as outlined above   No unusual exposure hx or h/o childhood pna/ asthma or knowledge of premature birth.  Current Allergies, Complete Past Medical History, Past Surgical History, Family History, and Social History were reviewed in [employer] record.  ROS  The following are not active complaints unless bolded Hoarseness, sore throat, dysphagia, dental problems, itching, sneezing,  nasal congestion or discharge of excess mucus or purulent secretions,  ear ache,   fever, chills, sweats, unintended wt loss or wt gain, classically pleuritic or exertional cp,  orthopnea pnd or arm/hand swelling  or leg swelling, presyncope, palpitations, abdominal pain, anorexia, nausea, vomiting, diarrhea  or change in bowel habits or change in bladder habits, change in stools or change in urine, dysuria, hematuria,  rash, arthralgias, visual complaints, headache, numbness, weakness or ataxia or problems with walking or coordination,  change in mood or  memory.        Current Meds  Medication Sig  . acetaminophen (TYLENOL) 500 MG tablet Take 1,000 mg by mouth 2 (two) times daily.   . Ascorbic Acid (VITAMIN C) 1000 MG tablet Take 1,000 mg by  mouth daily.  Marland Kitchen aspirin EC 81 MG tablet Take 81 mg by mouth daily.  . Calcium Carbonate Antacid (TUMS PO) Take 1 tablet by mouth daily.  . cetirizine (ZYRTEC) 10 MG tablet Take 10 mg by mouth daily.  . Cholecalciferol (VITAMIN D-3) 1000 units CAPS Take 1 capsule by mouth 2 (two) times daily.   . clobetasol cream (TEMOVATE) 3.50 % Apply 1 application topically daily at 12 noon.  . Cyanocobalamin (VITAMIN B12) 1000 MCG TBCR Take 1 tablet by mouth 2 (two) times daily.   . Docosahexaenoic Acid (DHA OMEGA 3 PO) Take 1,000 mg by mouth daily.   Marland Kitchen donepezil (ARICEPT) 5 MG tablet Take 5 mg by mouth at bedtime.  . folic acid (FOLVITE) 1 MG tablet Take 1 mg by mouth daily.  . irbesartan (AVAPRO) 75 MG tablet Take 75 mg by mouth at bedtime. **REPLACES VALSARTAN**  . Omega 3-6-9 Fatty Acids (OMEGA DHA PO) Take 1 tablet by mouth at bedtime.   . Probiotic Product (PROBIOTIC PO) Take 1 capsule by mouth daily.   Marland Kitchen Ubiquinol (QUNOL COQ10/UBIQUINOL/MEGA) 100 MG CAPS Take 1 tablet by mouth at bedtime.   Marland Kitchen UNABLE TO FIND Med Name: CPAP per Dr Beacher May  . valACYclovir (VALTREX) 1000 MG tablet Take 1,000 mg by mouth daily.                   Objective:   Physical Exam   Stoic bm nad   12/16/2017     182   06/15/2016      181   06/25/13 182 lb (82.555 kg)  05/30/13 178 lb 9.6 oz (81.012 kg)  05/18/13 176 lb (79.833 kg)       HEENT: nl dentition / oropharynx. Nl external ear canals without cough reflex -  Mild bilateral non-specific turbinate edema     NECK :  without JVD/Nodes/TM/ nl carotid upstrokes bilaterally   LUNGS: no acc muscle use,  Mild barrel  contour chest wall with bilateral  Distant bs s audible wheeze and  without cough on insp or exp maneuver and mild  Hyperresonant  to  percussion bilaterally     CV:  RRR  no s3 or murmur or increase in P2, and no edema   ABD:  soft and nontender with pos late insp Hoover's  in the supine position. No bruits or organomegaly appreciated, bowel  sounds nl  MS:   Nl gait/  ext warm without deformities, calf tenderness, cyanosis or clubbing No obvious joint restrictions   SKIN: warm and dry without lesions    NEURO:  alert, approp, nl sensorium with  no motor or cerebellar deficits apparent.              I personally reviewed images and agree with radiology impression as  follows:  CXR:   09/17/17 Slightly decreased lung volumes. No large pleural effusions.       .   Assessment:

## 2017-12-17 ENCOUNTER — Encounter: Payer: Self-pay | Admitting: Internal Medicine

## 2017-12-17 DIAGNOSIS — G4733 Obstructive sleep apnea (adult) (pediatric): Secondary | ICD-10-CM | POA: Diagnosis not present

## 2017-12-17 NOTE — Assessment & Plan Note (Signed)
-   spirometry 06/25/13  FEV1  2.17 (74%) ratio 67  - Spirometry 06/15/2016  FEV1 2.11 (65%)  Ratio 69   - 12/16/2017  After extensive coaching inhaler device,  effectiveness =    90% with elipta so try sample of trelegy available at office  then switch to maint anoro (not availabe at office)  Hx difficult due to fact that there are multiple factors limiting ambulation and ? If even limted by airflow with last fev1 > 2 liters but pt may be considered Group B in terms of symptom/risk and laba/lama therefore appropriate rx at this point but not available in sample form at office so try trelegy sample x 2 weeks first and if really helps achieve the goal of "walkin the neighbor hood (which I doubt for the reasons above) then it makes sense to change to anoro, same device, as maint

## 2017-12-17 NOTE — Assessment & Plan Note (Signed)
4-5 min discussion re active cigarette smoking in addition to office E&M  Ask about tobacco use:  Ongoing though minimal Advise quitting    I reviewed the Fletcher curve with the patient that basically indicates  if you quit smoking when your best day FEV1 is still well preserved (as is clearly  the case here)  it is highly unlikely you will progress to severe disease and informed the patient there was  no medication on the market that has proven to alter the curve/ its downward trajectory  or the likelihood of progression of their disease(unlike other chronic medical conditions such as atheroclerosis where we do think we can change the natural hx with risk reducing meds)    Therefore stopping smoking and maintaining abstinence are  the most important aspects of his care, not choice of inhalers or for that matter, doctors.  Treatment other than smoking cessation  is entirely directed by severity of symptoms (see copd a/p)  and focused also on reducing exacerbations, not attempting to change the natural history of the disease.   Assess willingness:  Not committed at this point Assist in quit attempt:  Per PCP when ready Arrange follow up:   Follow up per Primary Care planned       I had an extended discussion with the patient and daughter  reviewing all relevant studies completed to date and  lasting 15 to 20 minutes of a 25 minute visit    See device teaching which extended face to face time for this visit.  Each maintenance medication was reviewed in detail including emphasizing most importantly the difference between maintenance and prns and under what circumstances the prns are to be triggered using an action plan format that is not reflected in the computer generated alphabetically organized AVS which I have not found useful in most complex patients, especially with respiratory illnesses  Please see AVS for specific instructions unique to this visit that I personally wrote and verbalized to  the the pt in detail and then reviewed with pt  by my nurse highlighting any  changes in therapy recommended at today's visit to their plan of care.

## 2017-12-19 ENCOUNTER — Ambulatory Visit: Payer: Medicare HMO | Admitting: Physical Therapy

## 2017-12-19 DIAGNOSIS — R42 Dizziness and giddiness: Secondary | ICD-10-CM | POA: Diagnosis not present

## 2017-12-19 DIAGNOSIS — R2681 Unsteadiness on feet: Secondary | ICD-10-CM

## 2017-12-19 DIAGNOSIS — R278 Other lack of coordination: Secondary | ICD-10-CM | POA: Diagnosis not present

## 2017-12-19 DIAGNOSIS — M6281 Muscle weakness (generalized): Secondary | ICD-10-CM | POA: Diagnosis not present

## 2017-12-19 DIAGNOSIS — R2689 Other abnormalities of gait and mobility: Secondary | ICD-10-CM

## 2017-12-19 NOTE — Therapy (Signed)
[facility] [address] [place], [place], [address] Phone: [phone]   Fax:  [phone]  Physical Therapy Treatment  Patient Details  Name: Derek Carr MRN: [id number] Date of Birth: 15-Oct-1931 Referring Provider (PT): Mechele Claude, [place]   Encounter Date: 12/19/2017  PT End of Session - 12/19/17 1246    Visit Number  31    Number of Visits  39    Date for PT Re-Evaluation  01/13/18    Authorization Type  [employer] & Generic commercial 2nd    Authorization Time Period  Progress Note due visit 23    PT Start Time  1147    PT Stop Time  1230    PT Time Calculation (min)  43 min    Equipment Utilized During Treatment  Gait belt    Activity Tolerance  Patient tolerated treatment well;No increased pain    Behavior During Therapy  WFL for tasks assessed/performed       Past Medical History:  Diagnosis Date  . AAA (abdominal aortic aneurysm) ([place])   . Anemia   . Ankle fracture    left  . Aortic regurgitation   . Atherosclerosis   . Complication of anesthesia    CONFUSION - Pt's family very concerned about this  . DDD (degenerative disc disease)   . Degenerative arthritis   . Dermatitis, atopic ARMS AND LEGS  . Diverticulosis   . Erectile dysfunction   . Frequency of urination   . Glaucoma   . History of asbestos exposure   . History of radiation therapy 8/19-13-10/18/11   prostate, 30 GY  . History of shingles 2012-- BILATERAL EYES--  NO RESIDUAL  . Hyperlipidemia   . Hypertension   . Inguinal hernia   . Lumbar scoliosis   . Nocturia   . OA (osteoarthritis) of hip RIGHT  . Prostate cancer ([facility] [facility]) 05/11/2011   bx=Adenocarcinoma,gleason3+4=7, 4+4=8,PSA=10.30volume=45.7cc  . Renal cyst    bilateral  . Smokers' cough ([place])   . Stroke ([place])   . Thyroid cyst     Past Surgical History:  Procedure Laterality Date  . ATTEMPTED LEFT VATS/ LEFT THORACOTOMY/ RESECTION OF THE ENORMOUS, PROBABLE  BRONCHOGENIC CYST  01-04-2005  DR Arlyce Dice  . CARDIAC CATHETERIZATION  02-24-2009  DR NASHER   MINOR CORONARY ARTERY IRREGULARITIES/ NORMAL LVSF/ EF 65-70%  . CATARACT EXTRACTION    . COLONOSCOPY W/ POLYPECTOMY    . CYSTOSCOPY  11/15/2011   Procedure: CYSTOSCOPY;  Surgeon: Dutch Gray, MD;  Location[facility];  Service: Urology;  Laterality: N/A;  no seeds seen in bladder  . esophageus cyst removal  YRS AGO  . [place]  . ORIF ANKLE FRACTURE Left 05/03/2017  . ORIF ANKLE FRACTURE Left 05/03/2017   Procedure: OPEN REDUCTION INTERNAL FIXATION (ORIF) BIMALLEOLAR  ANKLE FRACTURE;  Surgeon: Wylene Simmer, MD;  Location: [facility];  Service: Orthopedics;  Laterality: Left;  . PACEMAKER IMPLANT N/A 09/16/2017   Procedure: PACEMAKER IMPLANT;  Surgeon: Constance Haw, MD;  Location: [facility];  Service: Cardiovascular;  Laterality: N/A;  . PROSTATE BIOPSY  05/11/11   Adenocarcinoma ([facility])  . RADIOACTIVE SEED IMPLANT  11/15/2011   Procedure: RADIOACTIVE SEED IMPLANT;  Surgeon: Dutch Gray, MD;  Location: [facility];  Service: Urology;  Laterality: N/A;  Total number of seeds - 52  . REPAIR RIGHT INGUINAL HERNIA W/ MESH  09-10-1999  . TOTAL HIP ARTHROPLASTY Right 03/20/2013   Procedure:  RIGHT TOTAL HIP ARTHROPLASTY ANTERIOR APPROACH;  Surgeon: Matthew D Olin, MD;  Location: [facility];  Service: Orthopedics;  Laterality: Right;  . TOTAL HIP REVISION Right 05/14/2013   Procedure: open reduction internal fixation REVISION RIGHT HIP ;  Surgeon: Matthew D Olin, MD;  Location: [facility];  Service: Orthopedics;  Laterality: Right;  . UMBILICAL HERNIA REPAIR  1998   epigastric    There were no vitals filed for this visit.  Subjective Assessment - 12/19/17 1153    Subjective  He has not had any falls. He has not been as dizzy as before.    Pertinent History  left ankle fracture, TIAs, DDD, Lumbar scoliosis, Glaucoma, cataract surgery, HTN, right THR  03/20/13 with revision 05/14/13, right femur fracture, dementia/Alzheimers    Limitations  Standing;Walking;House hold activities    Currently in Pain?  No/denies                       [facility] Adult PT Treatment/Exercise - 12/19/17 1154      Ambulation/Gait   Ambulation/Gait  Yes    Ambulation/Gait Assistance  4: Min assist;4: Min guard    Ambulation/Gait Assistance Details  Pt requires VCs to decrease speed for safety and min assist for instability.    Ambulation Distance (Feet)  230 Feet   230x2   Assistive device  Straight cane    Gait Pattern  Step-through pattern;Decreased stance time - left;Decreased weight shift to left;Lateral hip instability;Wide base of support;Decreased stride length;Decreased step length - left;Poor foot clearance - right    Ambulation Surface  Level;Indoor    Gait Comments  Pt enters/exits clininc with RW. Pt ambulates through clinic with cane. Pt demonstrated improved use of cane but becomes fatigue and unsteady at end of second lap in clinic.      High Level Balance   High Level Balance Activities  Other (comment)    High Level Balance Comments  in // bars pt performed: retro gait and tandem walking with one UE assist x 3 laps each; Pt performed standing balance on compliant surface progressing from static to head turns, and then to eyes closed for 30 sec each; Pt perormed step ups from level surface to 6" board 10x each BIL' Pt performed lateral stepping x 3 laps each.   Pt used R UE to simulate use of cane              PT Short Term Goals - 12/12/17 1142      PT SHORT TERM GOAL #1   Title  Pt will be independent with updated HEP exercises (All STGs Target Date 12/16/2017)    Time  1    Period  Months    Status  On-going      PT SHORT TERM GOAL #2   Title  Patient ambulates 500' outdoor surfaces including grass with cane with supervision.     Baseline  12/12/2016 Pt required seated rest break in the lobby due to instability  requiring min assist to maintain balance.    Time  1    Period  Months    Status  Not Met      PT SHORT TERM GOAL #3   Title  Timed Up & Go with cane <15sec with supervision.     Baseline  11/18 21.25 sec with cane    Status  Not Met      PT SHORT TERM GOAL #4   Title  Berg Balance >36/56      Baseline  11/18 39/56, imroved from 36/56    Time  1    Period  Months    Status  Partially Met      PT SHORT TERM GOAL #5   Title  Patient negotiates ramps & curbs with cane with supervision    Baseline  12/12/2017 Pt performed ramps/curbs with supervision.    Time  1    Period  Months    Status  On-going        PT Long Term Goals - 10/17/17 1822      PT LONG TERM GOAL #1   Title  Pt will demonstrate independence with ongoing HEP program and fitness plan (All LTGs Target Date 01/13/2018)    Time  90    Period  Days    Status  On-going    Target Date  01/13/18      PT LONG TERM GOAL #2   Title  Patient ambulates Modified Independent 500' outdoors including grass, pavement, curbs, ramps with cane to improve community ambulatory skills    Time  90    Period  Days    Status  On-going    Target Date  01/13/18      PT LONG TERM GOAL #3   Title  Berg Balance >/= 45/56 to indicate lower fall risk.     Time  90    Period  Days    Status  On-going    Target Date  01/13/18      PT LONG TERM GOAL #4   Title  Timed Up-Go time with cane <13.5 seconds to indicate lower fall risk.     Time  90    Period  Days    Status  On-going    Target Date  01/13/18      PT LONG TERM GOAL #5   Title  Dynamic Gait Index with cane >12/24 to indicate lower fall risk.     Time  90    Period  Days    Status  New    Target Date  01/13/18            Plan - 12/19/17 1247    Clinical Impression Statement  Treatment today focused on activity tolerance and high level balance exercises. Pt becomes unsteady once fatigued. Pt would beenfit from further physcial therpay to increase endurance and  balnce deficits.    Clinical Presentation  Evolving    Clinical Decision Making  Moderate    Rehab Potential  Good    Clinical Impairments Affecting Rehab Potential  progressive decline over 2 years, good attitude and family support with 4 children in area.     PT Frequency  2x / week    PT Duration  12 weeks    PT Treatment/Interventions  Gait training;Functional mobility training;Neuromuscular re-education;Balance training;Therapeutic exercise;Therapeutic activities;Patient/family education;ADLs/Self Care Home Management;Stair training;Orthotic Fit/Training;Manual techniques;Vestibular;Canalith Repostioning    PT Next Visit Plan   gait, balance, functional mobility with SPC    Consulted and Agree with Plan of Care  Patient       Patient will benefit from skilled therapeutic intervention in order to improve the following deficits and impairments:  Abnormal gait, Decreased activity tolerance, Decreased balance, Decreased endurance, Decreased knowledge of use of DME, Decreased mobility, Decreased range of motion, Decreased safety awareness, Decreased strength, Impaired flexibility, Postural dysfunction, Dizziness  Visit Diagnosis: Unsteadiness on feet  Muscle weakness (generalized)  Other abnormalities of gait and mobility     Problem List Patient Active   Problem List   Diagnosis Date Noted  . Bradycardia 09/16/2017  . CKD (chronic kidney disease), stage III (HCC) 09/15/2017  . Near syncope 09/14/2017  . Post-operative pain   . Dementia without behavioral disturbance (HCC)   . TIA (transient ischemic attack)   . Reactive hypertension   . Bimalleolar fracture of left ankle 05/03/2017  . Axonal neuropathy 03/21/2017  . Right sided weakness 03/01/2017  . Cigarette smoker 06/16/2016  . Dyspnea on exertion 06/15/2016  . Alzheimer disease (HCC) 01/07/2014  . CSA (central sleep apnea) 10/05/2013  . Syncope 06/26/2013  . COPD GOLD II 06/25/2013  . BPH (benign prostatic hyperplasia)  05/18/2013  . Glaucoma 05/18/2013  . S/P right TH revision 05/14/2013  . Constipation 03/26/2013  . Osteoporosis 03/26/2013  . S/P right THA, AA 03/20/2013  . Hyperlipidemia   . Erectile dysfunction   . History of asbestos exposure   . Nocturia   . History of shingles   . Dermatitis, atopic   . OA (osteoarthritis) of hip   . Arthritis   . Frequency of urination   . Lumbar scoliosis   . Malignant neoplasm of prostate (HCC) 06/07/2011  . 79-year-old gentleman with stage T3 adenocarcinoma prostate with Gleason score 4+4 and PSA of 10.3 05/11/2011      SPTA 12/19/2017, 12:48 PM  [facility] [address] [place], [place], [address] Phone: [phone]   Fax:  [phone]  Name: Derek Carr MRN: [id number] Date of Birth: 02/20/1931   

## 2017-12-20 ENCOUNTER — Ambulatory Visit: Payer: Medicare HMO | Admitting: Physical Therapy

## 2017-12-20 ENCOUNTER — Encounter: Payer: Self-pay | Admitting: Physical Therapy

## 2017-12-20 DIAGNOSIS — R2689 Other abnormalities of gait and mobility: Secondary | ICD-10-CM | POA: Diagnosis not present

## 2017-12-20 DIAGNOSIS — M6281 Muscle weakness (generalized): Secondary | ICD-10-CM

## 2017-12-20 DIAGNOSIS — R278 Other lack of coordination: Secondary | ICD-10-CM | POA: Diagnosis not present

## 2017-12-20 DIAGNOSIS — R42 Dizziness and giddiness: Secondary | ICD-10-CM | POA: Diagnosis not present

## 2017-12-20 DIAGNOSIS — R2681 Unsteadiness on feet: Secondary | ICD-10-CM

## 2017-12-20 NOTE — Therapy (Signed)
[facility] [address] [place], [place], [address] Phone: [phone]   Fax:  [phone]  Physical Therapy Treatment  Patient Details  Name: Derek Carr MRN: [id number] Date of Birth: 05-Apr-1931 Referring Provider (PT): Mechele Claude, [place]   Encounter Date: 12/20/2017  PT End of Session - 12/20/17 1310    Visit Number  32    Number of Visits  39    Date for PT Re-Evaluation  01/13/18    Authorization Type  [employer] Medicare & Generic commercial 2nd    Authorization Time Period  Progress Note due visit 23    PT Start Time  1017    PT Stop Time  1100    PT Time Calculation (min)  43 min    Equipment Utilized During Treatment  Gait belt    Activity Tolerance  Patient tolerated treatment well;No increased pain    Behavior During Therapy  WFL for tasks assessed/performed       Past Medical History:  Diagnosis Date  . AAA (abdominal aortic aneurysm) ([place])   . Anemia   . Ankle fracture    left  . Aortic regurgitation   . Atherosclerosis   . Complication of anesthesia    CONFUSION - Pt's family very concerned about this  . DDD (degenerative disc disease)   . Degenerative arthritis   . Dermatitis, atopic ARMS AND LEGS  . Diverticulosis   . Erectile dysfunction   . Frequency of urination   . Glaucoma   . History of asbestos exposure   . History of radiation therapy 8/19-13-10/18/11   prostate, 42 GY  . History of shingles 2012-- BILATERAL EYES--  NO RESIDUAL  . Hyperlipidemia   . Hypertension   . Inguinal hernia   . Lumbar scoliosis   . Nocturia   . OA (osteoarthritis) of hip RIGHT  . Prostate cancer ([place]) 05/11/2011   bx=Adenocarcinoma,gleason3+4=7, 4+4=8,PSA=10.30volume=45.7cc  . Renal cyst    bilateral  . Smokers' cough ([place])   . Stroke ([place])   . Thyroid cyst     Past Surgical History:  Procedure Laterality Date  . ATTEMPTED LEFT VATS/ LEFT THORACOTOMY/ RESECTION OF THE ENORMOUS, PROBABLE  BRONCHOGENIC CYST  01-04-2005  DR Arlyce Dice  . CARDIAC CATHETERIZATION  02-24-2009  DR NASHER   MINOR CORONARY ARTERY IRREGULARITIES/ NORMAL LVSF/ EF 65-70%  . CATARACT EXTRACTION    . COLONOSCOPY W/ POLYPECTOMY    . CYSTOSCOPY  11/15/2011   Procedure: CYSTOSCOPY;  Surgeon: Dutch Gray, MD;  Location: [facility];  Service: Urology;  Laterality: N/A;  no seeds seen in bladder  . esophageus cyst removal  YRS AGO  . [place]  . ORIF ANKLE FRACTURE Left 05/03/2017  . ORIF ANKLE FRACTURE Left 05/03/2017   Procedure: OPEN REDUCTION INTERNAL FIXATION (ORIF) BIMALLEOLAR  ANKLE FRACTURE;  Surgeon: Wylene Simmer, MD;  Location: [facility];  Service: Orthopedics;  Laterality: Left;  . PACEMAKER IMPLANT N/A 09/16/2017   Procedure: PACEMAKER IMPLANT;  Surgeon: Constance Haw, MD;  Location: [facility];  Service: Cardiovascular;  Laterality: N/A;  . PROSTATE BIOPSY  05/11/11   Adenocarcinoma (MD OFFICE)  . RADIOACTIVE SEED IMPLANT  11/15/2011   Procedure: RADIOACTIVE SEED IMPLANT;  Surgeon: Dutch Gray, MD;  Location: [facility]  Service: Urology;  Laterality: N/A;  Total number of seeds - 52  . REPAIR RIGHT INGUINAL HERNIA W/ MESH  09-10-1999  . TOTAL HIP ARTHROPLASTY Right 03/20/2013   Procedure:  RIGHT TOTAL HIP ARTHROPLASTY ANTERIOR APPROACH;  Surgeon: [name], MD;  Location: [facility];  Service: Orthopedics;  Laterality: Right;  . TOTAL HIP REVISION Right 05/14/2013   Procedure: open reduction internal fixation REVISION RIGHT HIP ;  Surgeon: [name], MD;  Location: [facility];  Service: Orthopedics;  Laterality: Right;  . UMBILICAL HERNIA REPAIR  1998   epigastric    There were no vitals filed for this visit.  Subjective Assessment - 12/20/17 1105    Subjective  Has not had any falls. Has felt dizzy every now and then especially in the morning.     Pertinent History  left ankle fracture, TIAs, DDD, Lumbar scoliosis, Glaucoma, cataract  surgery, HTN, right THR 03/20/13 with revision 05/14/13, right femur fracture, dementia/Alzheimers    Limitations  Standing;Walking;House hold activities    Currently in Pain?  No/denies            [facility] Adult PT Treatment/Exercise - 12/20/17 1305      Ambulation/Gait   Ambulation/Gait  Yes    Ambulation/Gait Assistance  5: Supervision;4: Min guard    Ambulation/Gait Assistance Details  Verbal and tactile cues for uprigtht posture.     Ambulation Distance (Feet)  230 Feet   with 1 rest break    Assistive device  Straight cane    Gait Pattern  Step-through pattern;Decreased stance time - left;Decreased weight shift to left;Lateral hip instability;Wide base of support;Decreased stride length;Decreased step length - left;Poor foot clearance - right    Ambulation Surface  Level;Indoor      Exercises   Exercises  Knee/Hip;Lumbar    Other Exercises  (Thomas test position)- Lifting LE from floor to mat with knee bent from supine postion on mat table x10 reps. Bridges x10 reps; back extensions with back to counter with verbal and tactile cues to correct lateral lean; back extensions in chair with towel roll at low back; rows with yellow Tband while seated in chair with tactile cues to relax shoulders. Verbal cues and demo for proper exercise technique.       Knee/Hip Exercises: Stretches   Hip Flexor Stretch  Both;2 reps;30 seconds   pt supine with leg off mat table               PT Short Term Goals - 12/12/17 1142      PT SHORT TERM GOAL #1   Title  Pt will be independent with updated HEP exercises (All STGs Target Date 12/16/2017)    Time  1    Period  Months    Status  On-going      PT SHORT TERM GOAL #2   Title  Patient ambulates 500' outdoor surfaces including grass with cane with supervision.     Baseline  12/12/2016 Pt required seated rest break in the lobby due to instability requiring min assist to maintain balance.    Time  1    Period  Months    Status  Not Met       PT SHORT TERM GOAL #3   Title  Timed Up & Go with cane <15sec with supervision.     Baseline  11/18 21.25 sec with cane    Status  Not Met      PT SHORT TERM GOAL #4   Title  Berg Balance >36/56    Baseline  11/18 39/56, imroved from 36/56    Time  1    Period  Months    Status  Partially Met  PT SHORT TERM GOAL #5   Title  Patient negotiates ramps & curbs with cane with supervision    Baseline  12/12/2017 Pt performed ramps/curbs with supervision.    Time  1    Period  Months    Status  On-going        PT Long Term Goals - 10/17/17 1822      PT LONG TERM GOAL #1   Title  Pt will demonstrate independence with ongoing HEP program and fitness plan (All LTGs Target Date 01/13/2018)    Time  90    Period  Days    Status  On-going    Target Date  01/13/18      PT LONG TERM GOAL #2   Title  Patient ambulates Modified Independent 500' outdoors including grass, pavement, curbs, ramps with cane to improve community ambulatory skills    Time  90    Period  Days    Status  On-going    Target Date  01/13/18      PT LONG TERM GOAL #3   Title  Berg Balance >/= 45/56 to indicate lower fall risk.     Time  90    Period  Days    Status  On-going    Target Date  01/13/18      PT LONG TERM GOAL #4   Title  Timed Up-Go time with cane <13.5 seconds to indicate lower fall risk.     Time  90    Period  Days    Status  On-going    Target Date  01/13/18      PT LONG TERM GOAL #5   Title  Dynamic Gait Index with cane >12/24 to indicate lower fall risk.     Time  90    Period  Days    Status  New    Target Date  01/13/18            Plan - 12/20/17 1311    Clinical Impression Statement  Today's treatment focused on gait training with cane and facilitating proper posture emphasizing back extensions and flexibiltiy of hip flexors. Pt tolerates flexibility and strengthening exercises without complication with decent carryover to gait training. Pt continues to require  verbal and tactile cues for upright posture. Pt would benefit from further PT to continue progressing toward established goals.    Rehab Potential  Good    Clinical Impairments Affecting Rehab Potential  progressive decline over 2 years, good attitude and family support with 4 children in area.     PT Frequency  2x / week    PT Duration  12 weeks    PT Treatment/Interventions  Gait training;Functional mobility training;Neuromuscular re-education;Balance training;Therapeutic exercise;Therapeutic activities;Patient/family education;ADLs/Self Care Home Management;Stair training;Orthotic Fit/Training;Manual techniques;Vestibular;Canalith Repostioning    PT Next Visit Plan   gait, balance, functional mobility with SPC. continue with hip flexor flexibility    Consulted and Agree with Plan of Care  Patient       Patient will benefit from skilled therapeutic intervention in order to improve the following deficits and impairments:  Abnormal gait, Decreased activity tolerance, Decreased balance, Decreased endurance, Decreased knowledge of use of DME, Decreased mobility, Decreased range of motion, Decreased safety awareness, Decreased strength, Impaired flexibility, Postural dysfunction, Dizziness  Visit Diagnosis: Unsteadiness on feet  Muscle weakness (generalized)  Other abnormalities of gait and mobility     Problem List Patient Active Problem List   Diagnosis Date Noted  . Bradycardia 09/16/2017  .  CKD (chronic kidney disease), stage III ([place]) 09/15/2017  . Near syncope 09/14/2017  . Post-operative pain   . Dementia without behavioral disturbance ([place])   . TIA (transient ischemic attack)   . Reactive hypertension   . Bimalleolar fracture of left ankle 05/03/2017  . Axonal neuropathy 03/21/2017  . Right sided weakness 03/01/2017  . Cigarette smoker 06/16/2016  . Dyspnea on exertion 06/15/2016  . Alzheimer disease ([place]) 01/07/2014  . CSA (central sleep apnea) 10/05/2013  . Syncope  06/26/2013  . COPD GOLD II 06/25/2013  . BPH (benign prostatic hyperplasia) 05/18/2013  . Glaucoma 05/18/2013  . S/P right TH revision 05/14/2013  . Constipation 03/26/2013  . Osteoporosis 03/26/2013  . S/P right THA, AA 03/20/2013  . Hyperlipidemia   . Erectile dysfunction   . History of asbestos exposure   . Nocturia   . History of shingles   . Dermatitis, atopic   . OA (osteoarthritis) of hip   . Arthritis   . Frequency of urination   . Lumbar scoliosis   . Malignant neoplasm of prostate ([place]) 06/07/2011  . 84 year old gentleman with stage T3 adenocarcinoma prostate with Gleason score 4+4 and PSA of 10.3 05/11/2011    Cecile Sheerer, SPTA 12/20/2017, 2:39 PM  [facility] [address] [place], [place], [address] Phone: [phone]   Fax:  [phone]  Name: Derek Carr MRN: [id number] Date of Birth: November 28, 1931

## 2017-12-21 ENCOUNTER — Encounter: Payer: Medicare HMO | Admitting: Internal Medicine

## 2017-12-27 ENCOUNTER — Encounter: Payer: Self-pay | Admitting: Physical Therapy

## 2017-12-27 ENCOUNTER — Ambulatory Visit: Payer: Medicare HMO | Attending: Internal Medicine | Admitting: Physical Therapy

## 2017-12-27 DIAGNOSIS — R278 Other lack of coordination: Secondary | ICD-10-CM | POA: Diagnosis not present

## 2017-12-27 DIAGNOSIS — R2681 Unsteadiness on feet: Secondary | ICD-10-CM | POA: Insufficient documentation

## 2017-12-27 DIAGNOSIS — R2689 Other abnormalities of gait and mobility: Secondary | ICD-10-CM | POA: Insufficient documentation

## 2017-12-27 DIAGNOSIS — R42 Dizziness and giddiness: Secondary | ICD-10-CM | POA: Insufficient documentation

## 2017-12-27 DIAGNOSIS — M25672 Stiffness of left ankle, not elsewhere classified: Secondary | ICD-10-CM | POA: Diagnosis not present

## 2017-12-27 DIAGNOSIS — M6281 Muscle weakness (generalized): Secondary | ICD-10-CM | POA: Insufficient documentation

## 2017-12-28 NOTE — Therapy (Signed)
[facility] [address] [place], [place], [address] Phone: [phone]   Fax:  [phone]  Physical Therapy Treatment  Patient Details  Name: Derek Carr MRN: [id number] Date of Birth: 06-Nov-1931 Referring Provider (PT): Mechele Claude, [place]   Encounter Date: 12/27/2017  PT End of Session - 12/27/17 1200    Visit Number  33    Number of Visits  39    Date for PT Re-Evaluation  01/13/18    Authorization Type  [employer] Medicare & Generic commercial 2nd    Authorization Time Period  Progress Note due visit 23    PT Start Time  1100    PT Stop Time  1140    PT Time Calculation (min)  40 min    Equipment Utilized During Treatment  Gait belt    Activity Tolerance  Patient tolerated treatment well;No increased pain    Behavior During Therapy  WFL for tasks assessed/performed       Past Medical History:  Diagnosis Date  . AAA (abdominal aortic aneurysm) ([place])   . Anemia   . Ankle fracture    left  . Aortic regurgitation   . Atherosclerosis   . Complication of anesthesia    CONFUSION - Pt's family very concerned about this  . DDD (degenerative disc disease)   . Degenerative arthritis   . Dermatitis, atopic ARMS AND LEGS  . Diverticulosis   . Erectile dysfunction   . Frequency of urination   . Glaucoma   . History of asbestos exposure   . History of radiation therapy 8/19-13-10/18/11   prostate, 75 GY  . History of shingles 2012-- BILATERAL EYES--  NO RESIDUAL  . Hyperlipidemia   . Hypertension   . Inguinal hernia   . Lumbar scoliosis   . Nocturia   . OA (osteoarthritis) of hip RIGHT  . Prostate cancer ([place]) 05/11/2011   bx=Adenocarcinoma,gleason3+4=7, 4+4=8,PSA=10.30volume=45.7cc  . Renal cyst    bilateral  . Smokers' cough ([place])   . Stroke ([place])   . Thyroid cyst     Past Surgical History:  Procedure Laterality Date  . ATTEMPTED LEFT VATS/ LEFT THORACOTOMY/ RESECTION OF THE ENORMOUS, PROBABLE  BRONCHOGENIC CYST  01-04-2005  DR Arlyce Dice  . CARDIAC CATHETERIZATION  02-24-2009  DR NASHER   MINOR CORONARY ARTERY IRREGULARITIES/ NORMAL LVSF/ EF 65-70%  . CATARACT EXTRACTION    . COLONOSCOPY W/ POLYPECTOMY    . CYSTOSCOPY  11/15/2011   Procedure: CYSTOSCOPY;  Surgeon: Dutch Gray, MD;  Location[facility];  Service: Urology;  Laterality: N/A;  no seeds seen in bladder  . esophageus cyst removal  YRS AGO  . [place]  . ORIF ANKLE FRACTURE Left 05/03/2017  . ORIF ANKLE FRACTURE Left 05/03/2017   Procedure: OPEN REDUCTION INTERNAL FIXATION (ORIF) BIMALLEOLAR  ANKLE FRACTURE;  Surgeon: Wylene Simmer, MD;  Location: [facility];  Service: Orthopedics;  Laterality: Left;  . PACEMAKER IMPLANT N/A 09/16/2017   Procedure: PACEMAKER IMPLANT;  Surgeon: Constance Haw, MD;  Location: [facility];  Service: Cardiovascular;  Laterality: N/A;  . PROSTATE BIOPSY  05/11/11   Adenocarcinoma ([facility] OFFICE)  . RADIOACTIVE SEED IMPLANT  11/15/2011   Procedure: RADIOACTIVE SEED IMPLANT;  Surgeon: Dutch Gray, MD;  Location: [facility];  Service: Urology;  Laterality: N/A;  Total number of seeds - 52  . REPAIR RIGHT INGUINAL HERNIA W/ MESH  09-10-1999  . TOTAL HIP ARTHROPLASTY Right 03/20/2013   Procedure:  RIGHT TOTAL HIP ARTHROPLASTY ANTERIOR APPROACH;  Surgeon: [name], MD;  [facility];  Service: Orthopedics;  Laterality: Right;  . TOTAL HIP REVISION Right 05/14/2013   Procedure: open reduction internal fixation REVISION RIGHT HIP ;  Surgeon: [name], MD;  [facility];  Service: Orthopedics;  Laterality: Right;  . UMBILICAL HERNIA REPAIR  1998   epigastric    There were no vitals filed for this visit.  Subjective Assessment - 12/27/17 1105    Subjective  No falls. He uses cane & RW in home and RW in community.     Pertinent History  left ankle fracture, TIAs, DDD, Lumbar scoliosis, Glaucoma, cataract surgery, HTN, right THR 03/20/13  with revision 05/14/13, right femur fracture, dementia/Alzheimers    Limitations  Standing;Walking;House hold activities    Patient Stated Goals  Patient wants to improve his balance & walking    Currently in Pain?  No/denies                       [facility] Adult PT Treatment/Exercise - 12/27/17 1100      Ambulation/Gait   Ambulation/Gait  Yes    Ambulation/Gait Assistance  5: Supervision;4: Min guard    Ambulation/Gait Assistance Details  verbal & tactile cues on posture & balance reactions    Ambulation Distance (Feet)  500 Feet   200' indoors & 500' outdoors   Assistive device  Straight cane    Gait Pattern  Step-through pattern;Decreased stance time - left;Decreased weight shift to left;Lateral hip instability;Wide base of support;Decreased stride length;Decreased step length - left;Poor foot clearance - right    Ambulation Surface  Indoor;Level;Outdoor;Paved    Ramp  5: Supervision   cane, outdoors   Ramp Details (indicate cue type and reason)  cues on posture & wt shift    Curb  5: Supervision;4: Min assist   cane, min guard to supervision   Curb Details (indicate cue type and reason)  cues on sequence/technique & balance reactions      High Level Balance   High Level Balance Activities  Side stepping;Backward walking;Head turns;Negotitating around obstacles;Negotiating over obstacles    High Level Balance Comments  MinA / tactile cues on reactions & technique      Neuro Re-ed    Neuro Re-ed Details   standing upright in parallel bars with intermittent support / touch: red theraband reciprocal & BUE row, forward reach & biceps curl 10 reps each.       Exercises   Exercises  --    Other Exercises   --      Lumbar Exercises: Standing   Other Standing Lumbar Exercises  sit to/from stand without UE assist from 24" stool      Knee/Hip Exercises: Stretches   Hip Flexor Stretch  --      Ankle Exercises: Standing   BAPS  Sitting;Level 1;5 reps   AA seated on 24"  stool, ant/post, med/lat & circles         Balance Exercises - 12/27/17 1100      Balance Exercises: Standing   Standing Eyes Opened  Wide (BOA);Head turns;Solid surface;5 reps    Standing Eyes Closed  Wide (BOA);Solid surface;10 secs;2 reps    Stepping Strategy  Anterior;Posterior;Lateral;Foam/compliant surface;5 reps   alt stepping on/stabilze/off; minA/tactile cues   Rockerboard  Anterior/posterior;Lateral;EO;Head turns;5 reps;Intermittent UE support   wt shifts, stabilization, recovery   Heel Raises Limitations  heel/toe raises x10, 1 UE support  PT Short Term Goals - 12/12/17 1142      PT SHORT TERM GOAL #1   Title  Pt will be independent with updated HEP exercises (All STGs Target Date 12/16/2017)    Time  1    Period  Months    Status  On-going      PT SHORT TERM GOAL #2   Title  Patient ambulates 500' outdoor surfaces including grass with cane with supervision.     Baseline  12/12/2016 Pt required seated rest break in the lobby due to instability requiring min assist to maintain balance.    Time  1    Period  Months    Status  Not Met      PT SHORT TERM GOAL #3   Title  Timed Up & Go with cane <15sec with supervision.     Baseline  11/18 21.25 sec with cane    Status  Not Met      PT SHORT TERM GOAL #4   Title  Berg Balance >36/56    Baseline  11/18 39/56, imroved from 36/56    Time  1    Period  Months    Status  Partially Met      PT SHORT TERM GOAL #5   Title  Patient negotiates ramps & curbs with cane with supervision    Baseline  12/12/2017 Pt performed ramps/curbs with supervision.    Time  1    Period  Months    Status  On-going        PT Long Term Goals - 10/17/17 1822      PT LONG TERM GOAL #1   Title  Pt will demonstrate independence with ongoing HEP program and fitness plan (All LTGs Target Date 01/13/2018)    Time  90    Period  Days    Status  On-going    Target Date  01/13/18      PT LONG TERM GOAL #2   Title  Patient  ambulates Modified Independent 500' outdoors including grass, pavement, curbs, ramps with cane to improve community ambulatory skills    Time  90    Period  Days    Status  On-going    Target Date  01/13/18      PT LONG TERM GOAL #3   Title  Berg Balance >/= 45/56 to indicate lower fall risk.     Time  90    Period  Days    Status  On-going    Target Date  01/13/18      PT LONG TERM GOAL #4   Title  Timed Up-Go time with cane <13.5 seconds to indicate lower fall risk.     Time  90    Period  Days    Status  On-going    Target Date  01/13/18      PT LONG TERM GOAL #5   Title  Dynamic Gait Index with cane >12/24 to indicate lower fall risk.     Time  90    Period  Days    Status  New    Target Date  01/13/18            Plan - 12/27/17 1917    Clinical Impression Statement  Patient improved distance of gait and balance reactions during gait. Today's skilled session focused on scanning during gait indoors & outdoors and balance to facilitate ankle, hip & step strategies.     Rehab Potential  Good  Clinical Impairments Affecting Rehab Potential  progressive decline over 2 years, good attitude and family support with 4 children in area.     PT Frequency  2x / week    PT Duration  12 weeks    PT Treatment/Interventions  Gait training;Functional mobility training;Neuromuscular re-education;Balance training;Therapeutic exercise;Therapeutic activities;Patient/family education;ADLs/Self Care Home Management;Stair training;Orthotic Fit/Training;Manual techniques;Vestibular;Canalith Repostioning    PT Next Visit Plan   gait, balance, functional mobility with SPC. work on left ankle strength/mobility with ankle board sitting, hip / LE strength    Consulted and Agree with Plan of Care  Patient       Patient will benefit from skilled therapeutic intervention in order to improve the following deficits and impairments:  Abnormal gait, Decreased activity tolerance, Decreased balance,  Decreased endurance, Decreased knowledge of use of DME, Decreased mobility, Decreased range of motion, Decreased safety awareness, Decreased strength, Impaired flexibility, Postural dysfunction, Dizziness  Visit Diagnosis: Unsteadiness on feet  Other abnormalities of gait and mobility  Muscle weakness (generalized)  Other lack of coordination  Stiffness of left ankle, not elsewhere classified     Problem List Patient Active Problem List   Diagnosis Date Noted  . Bradycardia 09/16/2017  . CKD (chronic kidney disease), stage III ([place]) 09/15/2017  . Near syncope 09/14/2017  . Post-operative pain   . Dementia without behavioral disturbance ([place])   . TIA (transient ischemic attack)   . Reactive hypertension   . Bimalleolar fracture of left ankle 05/03/2017  . Axonal neuropathy 03/21/2017  . Right sided weakness 03/01/2017  . Cigarette smoker 06/16/2016  . Dyspnea on exertion 06/15/2016  . Alzheimer disease ([place]) 01/07/2014  . CSA (central sleep apnea) 10/05/2013  . Syncope 06/26/2013  . COPD GOLD II 06/25/2013  . BPH (benign prostatic hyperplasia) 05/18/2013  . Glaucoma 05/18/2013  . S/P right TH revision 05/14/2013  . Constipation 03/26/2013  . Osteoporosis 03/26/2013  . S/P right THA, AA 03/20/2013  . Hyperlipidemia   . Erectile dysfunction   . History of asbestos exposure   . Nocturia   . History of shingles   . Dermatitis, atopic   . OA (osteoarthritis) of hip   . Arthritis   . Frequency of urination   . Lumbar scoliosis   . Malignant neoplasm of prostate ([place]) 06/07/2011  . 84 year old gentleman with stage T3 adenocarcinoma prostate with Gleason score 4+4 and PSA of 10.3 05/11/2011    Gurkirat Basher PT, DPT 12/28/2017, 9:23 AM  [facility] [address] [place], [place], [address] Phone: [phone]   Fax:  [phone]  Name: Derek Carr MRN: [id number] Date of Birth: 03/09/1931

## 2017-12-30 ENCOUNTER — Ambulatory Visit: Payer: Medicare HMO | Admitting: Physical Therapy

## 2017-12-30 DIAGNOSIS — R2681 Unsteadiness on feet: Secondary | ICD-10-CM | POA: Diagnosis not present

## 2017-12-30 DIAGNOSIS — M6281 Muscle weakness (generalized): Secondary | ICD-10-CM | POA: Diagnosis not present

## 2017-12-30 DIAGNOSIS — M25672 Stiffness of left ankle, not elsewhere classified: Secondary | ICD-10-CM | POA: Diagnosis not present

## 2017-12-30 DIAGNOSIS — R2689 Other abnormalities of gait and mobility: Secondary | ICD-10-CM | POA: Diagnosis not present

## 2017-12-30 DIAGNOSIS — R278 Other lack of coordination: Secondary | ICD-10-CM

## 2017-12-30 DIAGNOSIS — R42 Dizziness and giddiness: Secondary | ICD-10-CM | POA: Diagnosis not present

## 2017-12-30 NOTE — Therapy (Signed)
[facility] [address] [place], [place], [address] Phone: [phone]   Fax:  [phone]  Physical Therapy Treatment  Patient Details  Name: Derek Carr MRN: [id number] Date of Birth: 03/07/1931 Referring Provider (PT): Mechele Claude, [place]   Encounter Date: 12/30/2017  PT End of Session - 12/30/17 1158    Visit Number  34    Number of Visits  76    Date for PT Re-Evaluation  01/13/18    Authorization Type  [employer] Medicare & Generic commercial 2nd    Authorization Time Period  Progress Note due visit 23    PT Start Time  1101    PT Stop Time  1145    PT Time Calculation (min)  44 min    Equipment Utilized During Treatment  Gait belt    Activity Tolerance  Patient tolerated treatment well;No increased pain    Behavior During Therapy  WFL for tasks assessed/performed       Past Medical History:  Diagnosis Date  . AAA (abdominal aortic aneurysm) ([place])   . Anemia   . Ankle fracture    left  . Aortic regurgitation   . Atherosclerosis   . Complication of anesthesia    CONFUSION - Pt's family very concerned about this  . DDD (degenerative disc disease)   . Degenerative arthritis   . Dermatitis, atopic ARMS AND LEGS  . Diverticulosis   . Erectile dysfunction   . Frequency of urination   . Glaucoma   . History of asbestos exposure   . History of radiation therapy 8/19-13-10/18/11   prostate, 16 GY  . History of shingles 2012-- BILATERAL EYES--  NO RESIDUAL  . Hyperlipidemia   . Hypertension   . Inguinal hernia   . Lumbar scoliosis   . Nocturia   . OA (osteoarthritis) of hip RIGHT  . Prostate cancer ([place]) 05/11/2011   bx=Adenocarcinoma,gleason3+4=7, 4+4=8,PSA=10.30volume=45.7cc  . Renal cyst    bilateral  . Smokers' cough ([place])   . Stroke ([place])   . Thyroid cyst     Past Surgical History:  Procedure Laterality Date  . ATTEMPTED LEFT VATS/ LEFT THORACOTOMY/ RESECTION OF THE ENORMOUS, PROBABLE  BRONCHOGENIC CYST  01-04-2005  DR Arlyce Dice  . CARDIAC CATHETERIZATION  02-24-2009  DR NASHER   MINOR CORONARY ARTERY IRREGULARITIES/ NORMAL LVSF/ EF 65-70%  . CATARACT EXTRACTION    . COLONOSCOPY W/ POLYPECTOMY    . CYSTOSCOPY  11/15/2011   Procedure: CYSTOSCOPY;  Surgeon: Dutch Gray, MD;  Location: [facility];  Service: Urology;  Laterality: N/A;  no seeds seen in bladder  . esophageus cyst removal  YRS AGO  . [place]  . ORIF ANKLE FRACTURE Left 05/03/2017  . ORIF ANKLE FRACTURE Left 05/03/2017   Procedure: OPEN REDUCTION INTERNAL FIXATION (ORIF) BIMALLEOLAR  ANKLE FRACTURE;  Surgeon: Wylene Simmer, MD;  Location: [facility];  Service: Orthopedics;  Laterality: Left;  . PACEMAKER IMPLANT N/A 09/16/2017   Procedure: PACEMAKER IMPLANT;  Surgeon: Constance Haw, MD;  Location: [facility];  Service: Cardiovascular;  Laterality: N/A;  . PROSTATE BIOPSY  05/11/11   Adenocarcinoma ([facility])  . RADIOACTIVE SEED IMPLANT  11/15/2011   Procedure: RADIOACTIVE SEED IMPLANT;  Surgeon: Dutch Gray, MD;  Location: [facility];  Service: Urology;  Laterality: N/A;  Total number of seeds - 52  . REPAIR RIGHT INGUINAL HERNIA W/ MESH  09-10-1999  . TOTAL HIP ARTHROPLASTY Right 03/20/2013   Procedure:  RIGHT TOTAL HIP ARTHROPLASTY ANTERIOR APPROACH;  Surgeon: [name], MD;  Location: [facility];  Service: Orthopedics;  Laterality: Right;  . TOTAL HIP REVISION Right 05/14/2013   Procedure: open reduction internal fixation REVISION RIGHT HIP ;  Surgeon: [name], MD;  Location: [facility];  Service: Orthopedics;  Laterality: Right;  . UMBILICAL HERNIA REPAIR  1998   epigastric    There were no vitals filed for this visit.  Subjective Assessment - 12/30/17 1107    Subjective  Pt reports no falls. No changes in episode.    Pertinent History  left ankle fracture, TIAs, DDD, Lumbar scoliosis, Glaucoma, cataract surgery, HTN, right THR 03/20/13 with revision  05/14/13, right femur fracture, dementia/Alzheimers    Limitations  Standing;Walking;House hold activities    Patient Stated Goals  Patient wants to improve his balance & walking    Currently in Pain?  No/denies         [facility] Adult PT Treatment/Exercise - 12/30/17 1136      Ambulation/Gait   Ambulation/Gait  Yes    Ambulation/Gait Assistance  5: Supervision;4: Min guard    Ambulation/Gait Assistance Details  VCs for safe use of cane and posture    Ambulation Distance (Feet)  330 Feet   230x1,115x1,330x1   Assistive device  Straight cane    Gait Pattern  Step-through pattern;Decreased stance time - left;Decreased weight shift to left;Lateral hip instability;Wide base of support;Decreased stride length;Decreased step length - left;Poor foot clearance - right    Ramp  5: Supervision   cane indoors   Ramp Details (indicate cue type and reason)  VCs for use of cane and safety    Curb  --    Pre-Gait Activities  Ambulaton around obstacles with straight cane. Pt experience 1xLOB requiring min assist to regain balance.    Gait Comments  Pt ambulates at supervision level early on in treatment but as patient becomes fatigued, patient becomes unsteady and intermittent disuse of cane with increased risk of fall. Pt requires cues for posture and use of cane.      High Level Balance   High Level Balance Activities  Other (comment)    High Level Balance Comments  In // bars: Pt performed stepping strategy while on compliant surface in fwd/retro direction BIL x10 reps each with UE assist; Pt performed toe walking x3 laps with UE assist; Pt performed rockerboard in anterior/posterior direction with emphasis on ROM/ stability and UE assist.          PT Short Term Goals - 12/12/17 1142      PT SHORT TERM GOAL #1   Title  Pt will be independent with updated HEP exercises (All STGs Target Date 12/16/2017)    Time  1    Period  Months    Status  On-going      PT SHORT TERM GOAL #2   Title   Patient ambulates 500' outdoor surfaces including grass with cane with supervision.     Baseline  12/12/2016 Pt required seated rest break in the lobby due to instability requiring min assist to maintain balance.    Time  1    Period  Months    Status  Not Met      PT SHORT TERM GOAL #3   Title  Timed Up & Go with cane <15sec with supervision.     Baseline  11/18 21.25 sec with cane    Status  Not Met      PT SHORT  TERM GOAL #4   Title  Berg Balance >36/56    Baseline  11/18 39/56, imroved from 36/56    Time  1    Period  Months    Status  Partially Met      PT SHORT TERM GOAL #5   Title  Patient negotiates ramps & curbs with cane with supervision    Baseline  12/12/2017 Pt performed ramps/curbs with supervision.    Time  1    Period  Months    Status  On-going        PT Long Term Goals - 10/17/17 1822      PT LONG TERM GOAL #1   Title  Pt will demonstrate independence with ongoing HEP program and fitness plan (All LTGs Target Date 01/13/2018)    Time  90    Period  Days    Status  On-going    Target Date  01/13/18      PT LONG TERM GOAL #2   Title  Patient ambulates Modified Independent 500' outdoors including grass, pavement, curbs, ramps with cane to improve community ambulatory skills    Time  90    Period  Days    Status  On-going    Target Date  01/13/18      PT LONG TERM GOAL #3   Title  Berg Balance >/= 45/56 to indicate lower fall risk.     Time  90    Period  Days    Status  On-going    Target Date  01/13/18      PT LONG TERM GOAL #4   Title  Timed Up-Go time with cane <13.5 seconds to indicate lower fall risk.     Time  90    Period  Days    Status  On-going    Target Date  01/13/18      PT LONG TERM GOAL #5   Title  Dynamic Gait Index with cane >12/24 to indicate lower fall risk.     Time  90    Period  Days    Status  New    Target Date  01/13/18            Plan - 12/30/17 1159    Clinical Impression Statement  Treatment today  focused on ambulation with straight cane and balance exercises. Pt demonstrates increased safety risk and unsteadiness with progression of activity but continues to improve balance and mobility. Pt would continue to benefit from further physical therapy to increase mobility/balance deficits.    Clinical Presentation  Evolving    Clinical Decision Making  Moderate    Rehab Potential  Good    Clinical Impairments Affecting Rehab Potential  progressive decline over 2 years, good attitude and family support with 4 children in area.     PT Frequency  2x / week    PT Duration  12 weeks    PT Treatment/Interventions  Gait training;Functional mobility training;Neuromuscular re-education;Balance training;Therapeutic exercise;Therapeutic activities;Patient/family education;ADLs/Self Care Home Management;Stair training;Orthotic Fit/Training;Manual techniques;Vestibular;Canalith Repostioning    PT Next Visit Plan  Continue to work towards [employer] and Agree with Plan of Care  Patient       Patient will benefit from skilled therapeutic intervention in order to improve the following deficits and impairments:  Abnormal gait, Decreased activity tolerance, Decreased balance, Decreased endurance, Decreased knowledge of use of DME, Decreased mobility, Decreased range of motion, Decreased safety awareness, Decreased strength, Impaired flexibility, Postural dysfunction, Dizziness  Visit Diagnosis: Other abnormalities of gait and mobility  Unsteadiness on feet  Other lack of coordination     Problem List Patient Active Problem List   Diagnosis Date Noted  . Bradycardia 09/16/2017  . CKD (chronic kidney disease), stage III ([place]) 09/15/2017  . Near syncope 09/14/2017  . Post-operative pain   . Dementia without behavioral disturbance ([place])   . TIA (transient ischemic attack)   . Reactive hypertension   . Bimalleolar fracture of left ankle 05/03/2017  . Axonal neuropathy 03/21/2017  . Right sided  weakness 03/01/2017  . Cigarette smoker 06/16/2016  . Dyspnea on exertion 06/15/2016  . Alzheimer disease ([place]) 01/07/2014  . CSA (central sleep apnea) 10/05/2013  . Syncope 06/26/2013  . COPD GOLD II 06/25/2013  . BPH (benign prostatic hyperplasia) 05/18/2013  . Glaucoma 05/18/2013  . S/P right TH revision 05/14/2013  . Constipation 03/26/2013  . Osteoporosis 03/26/2013  . S/P right THA, AA 03/20/2013  . Hyperlipidemia   . Erectile dysfunction   . History of asbestos exposure   . Nocturia   . History of shingles   . Dermatitis, atopic   . OA (osteoarthritis) of hip   . Arthritis   . Frequency of urination   . Lumbar scoliosis   . Malignant neoplasm of prostate ([place]) 06/07/2011  . 84 year old gentleman with stage T3 adenocarcinoma prostate with Gleason score 4+4 and PSA of 10.3 05/11/2011    Sherrye Puga SPTA 12/30/2017, 12:02 PM  [facility] [address] [place], [place], [address] Phone: [phone]   Fax:  [phone]  Name: KO BARDON MRN: [id number] Date of Birth: October 28, 1931

## 2018-01-02 ENCOUNTER — Ambulatory Visit (INDEPENDENT_AMBULATORY_CARE_PROVIDER_SITE_OTHER): Payer: Medicare HMO | Admitting: Internal Medicine

## 2018-01-02 ENCOUNTER — Encounter: Payer: Self-pay | Admitting: Internal Medicine

## 2018-01-02 VITALS — BP 144/98 | HR 71 | Ht 75.0 in | Wt 183.0 lb

## 2018-01-02 DIAGNOSIS — Z950 Presence of cardiac pacemaker: Secondary | ICD-10-CM

## 2018-01-02 DIAGNOSIS — R001 Bradycardia, unspecified: Secondary | ICD-10-CM | POA: Diagnosis not present

## 2018-01-02 MED ORDER — METOPROLOL SUCCINATE ER 25 MG PO TB24: 25.0000 mg | ORAL_TABLET | Freq: Every day | ORAL | 3 refills | Status: DC

## 2018-01-02 NOTE — Patient Instructions (Addendum)
Medication Instructions:  Your physician has recommended you make the following change in your medication:   1.  Start taking TOPROL XL 25 mg--Take one tablet by mouth daily at bedtime.  Labwork: None ordered.  Testing/Procedures: None ordered.  Follow-Up: Your physician wants you to follow-up in: 9 months with Dr. Lovena Le.   You will receive a reminder letter in the mail two months in advance. If you don't receive a letter, please call our office to schedule the follow-up appointment.  Remote monitoring is used to monitor your Pacemaker from home. This monitoring reduces the number of office visits required to check your device to one time per year. It allows [place] to keep an eye on the functioning of your device to ensure it is working properly. You are scheduled for a device check from home on 04/03/2018. You may send your transmission at any time that day. If you have a wireless device, the transmission will be sent automatically. After your physician reviews your transmission, you will receive a postcard with your next transmission date.  Any Other Special Instructions Will Be Listed Below (If Applicable).  If you need a refill on your cardiac medications before your next appointment, please call your pharmacy.    Metoprolol extended-release tablets What is this medicine? METOPROLOL (me TOE proe lole) is a beta-blocker. Beta-blockers reduce the workload on the heart and help it to beat more regularly. This medicine is used to treat high blood pressure and to prevent chest pain. It is also used to after a heart attack and to prevent an additional heart attack from occurring. This medicine may be used for other purposes; ask your health care provider or pharmacist if you have questions. COMMON BRAND NAME(S): toprol, Toprol XL What should I tell my health care provider before I take this medicine? They need to know if you have any of these conditions: -diabetes -heart or vessel disease like  slow heart rate, worsening heart failure, heart block, sick sinus syndrome or Raynaud's disease -kidney disease -liver disease -lung or breathing disease, like asthma or emphysema -pheochromocytoma -thyroid disease -an unusual or allergic reaction to metoprolol, other beta-blockers, medicines, foods, dyes, or preservatives -pregnant or trying to get pregnant -breast-feeding How should I use this medicine? Take this medicine by mouth with a glass of water. Follow the directions on the prescription label. Do not crush or chew. Take this medicine with or immediately after meals. Take your doses at regular intervals. Do not take more medicine than directed. Do not stop taking this medicine suddenly. This could lead to serious heart-related effects. Talk to your pediatrician regarding the use of this medicine in children. While this drug may be prescribed for children as young as 6 years for selected conditions, precautions do apply. Overdosage: If you think you have taken too much of this medicine contact a poison control center or emergency room at once. NOTE: This medicine is only for you. Do not share this medicine with others. What if I miss a dose? If you miss a dose, take it as soon as you can. If it is almost time for your next dose, take only that dose. Do not take double or extra doses. What may interact with this medicine? This medicine may interact with the following medications: -certain medicines for blood pressure, heart disease, irregular heart beat -certain medicines for depression, like monoamine oxidase (MAO) inhibitors, fluoxetine, or paroxetine -clonidine -dobutamine -epinephrine -isoproterenol -reserpine This list may not describe all possible interactions. Give your health  care provider a list of all the medicines, herbs, non-prescription drugs, or dietary supplements you use. Also tell them if you smoke, drink alcohol, or use illegal drugs. Some items may interact with  your medicine. What should I watch for while using this medicine? Visit your doctor or health care professional for regular check ups. Contact your doctor right away if your symptoms worsen. Check your blood pressure and pulse rate regularly. Ask your health care professional what your blood pressure and pulse rate should be, and when you should contact them. You may get drowsy or dizzy. Do not drive, use machinery, or do anything that needs mental alertness until you know how this medicine affects you. Do not sit or stand up quickly, especially if you are an older patient. This reduces the risk of dizzy or fainting spells. Contact your doctor if these symptoms continue. Alcohol may interfere with the effect of this medicine. Avoid alcoholic drinks. What side effects may I notice from receiving this medicine? Side effects that you should report to your doctor or health care professional as soon as possible: -allergic reactions like skin rash, itching or hives -cold or numb hands or feet -depression -difficulty breathing -faint -fever with sore throat -irregular heartbeat, chest pain -rapid weight gain -swollen legs or ankles Side effects that usually do not require medical attention (report to your doctor or health care professional if they continue or are bothersome): -anxiety or nervousness -change in sex drive or performance -dry skin -headache -nightmares or trouble sleeping -short term memory loss -stomach upset or diarrhea -unusually tired This list may not describe all possible side effects. Call your doctor for medical advice about side effects. You may report side effects to FDA at 1-800-FDA-1088. Where should I keep my medicine? Keep out of the reach of children. Store at room temperature between 15 and 30 degrees C (59 and 86 degrees F). Throw away any unused medicine after the expiration date. NOTE: This sheet is a summary. It may not cover all possible information. If you  have questions about this medicine, talk to your doctor, pharmacist, or health care provider.  2018 Elsevier/Gold Standard (2012-09-15 14:41:37)

## 2018-01-02 NOTE — Progress Notes (Signed)
HPI Derek Carr returns today for followup of his medtronic DDD PM inserted due to chronotropic incompetence. He is a pleasant 84 yo man with a stroke who had a PPM placed approx 3-4 months ago. In the interim, he has been [place] and feels better. He gets sob and tired when he walks about 500 feet without stopping. He has known diastolic dysfunction and CoPD.  Allergies  Allergen Reactions  . Oxycodone Other (See Comments)  . Dilaudid [Hydromorphone Hcl] Other (See Comments)    Confusion   . Methocarbamol Other (See Comments)    Drowsiness   . Percodan [Oxycodone-Aspirin] Other (See Comments)    Confusion   . Tramadol Other (See Comments)    Drowsiness   . Vicodin [Hydrocodone-Acetaminophen] Other (See Comments)    Confusion      Current Outpatient Medications  Medication Sig Dispense Refill  . acetaminophen (TYLENOL) 500 MG tablet Take 1,000 mg by mouth 2 (two) times daily.     . Ascorbic Acid (VITAMIN C) 1000 MG tablet Take 1,000 mg by mouth daily.    Marland Kitchen aspirin EC 81 MG tablet Take 81 mg by mouth daily.    . Calcium Carbonate Antacid (TUMS PO) Take 1 tablet by mouth daily.    . cetirizine (ZYRTEC) 10 MG tablet Take 10 mg by mouth daily.    . Cholecalciferol (VITAMIN D-3) 1000 units CAPS Take 1 capsule by mouth 2 (two) times daily.     . clobetasol cream (TEMOVATE) 8.88 % Apply 1 application topically daily at 12 noon.    . Cyanocobalamin (VITAMIN B12) 1000 MCG TBCR Take 1 tablet by mouth 2 (two) times daily.     . Docosahexaenoic Acid (DHA OMEGA 3 PO) Take 1,000 mg by mouth daily.     Marland Kitchen donepezil (ARICEPT) 5 MG tablet Take 5 mg by mouth at bedtime.    . Fluticasone-Umeclidin-Vilant (TRELEGY ELLIPTA) 100-62.5-25 MCG/INH AEPB Inhale 1 puff into the lungs daily. 14 each 0  . folic acid (FOLVITE) 1 MG tablet Take 1 mg by mouth daily.    . irbesartan (AVAPRO) 75 MG tablet Take 75 mg by mouth at bedtime. **REPLACES VALSARTAN**  11  . Omega 3-6-9 Fatty Acids (OMEGA DHA  PO) Take 1 tablet by mouth at bedtime.     . Probiotic Product (PROBIOTIC PO) Take 1 capsule by mouth daily.     Marland Kitchen Ubiquinol (QUNOL COQ10/UBIQUINOL/MEGA) 100 MG CAPS Take 1 tablet by mouth at bedtime.     Marland Kitchen umeclidinium-vilanterol (ANORO ELLIPTA) 62.5-25 MCG/INH AEPB Only open the device one time and then take your two separate drags to be sure you get it all 1 each 11  . UNABLE TO FIND Med Name: CPAP per Dr Beacher May    . valACYclovir (VALTREX) 1000 MG tablet Take 1,000 mg by mouth daily.     No current facility-administered medications for this visit.      Past Medical History:  Diagnosis Date  . AAA (abdominal aortic aneurysm) ([place])   . Anemia   . Ankle fracture    left  . Aortic regurgitation   . Atherosclerosis   . Complication of anesthesia    CONFUSION - Pt's family very concerned about this  . DDD (degenerative disc disease)   . Degenerative arthritis   . Dermatitis, atopic ARMS AND LEGS  . Diverticulosis   . Erectile dysfunction   . Frequency of urination   . Glaucoma   . History of asbestos exposure   . History  of radiation therapy 8/19-13-10/18/11   prostate, 5 GY  . History of shingles 2012-- BILATERAL EYES--  NO RESIDUAL  . Hyperlipidemia   . Hypertension   . Inguinal hernia   . Lumbar scoliosis   . Nocturia   . OA (osteoarthritis) of hip RIGHT  . Prostate cancer ([place]) 05/11/2011   bx=Adenocarcinoma,gleason3+4=7, 4+4=8,PSA=10.30volume=45.7cc  . Renal cyst    bilateral  . Smokers' cough ([place])   . Stroke ([place])   . Thyroid cyst     ROS:   All systems reviewed and negative except as noted in the HPI.   Past Surgical History:  Procedure Laterality Date  . ATTEMPTED LEFT VATS/ LEFT THORACOTOMY/ RESECTION OF THE ENORMOUS, PROBABLE BRONCHOGENIC CYST  01-04-2005  DR Arlyce Dice  . CARDIAC CATHETERIZATION  02-24-2009  DR NASHER   MINOR CORONARY ARTERY IRREGULARITIES/ NORMAL LVSF/ EF 65-70%  . CATARACT EXTRACTION    . COLONOSCOPY W/ POLYPECTOMY    . CYSTOSCOPY   11/15/2011   Procedure: CYSTOSCOPY;  Surgeon: Dutch Gray, MD;  Location: [facility];  Service: Urology;  Laterality: N/A;  no seeds seen in bladder  . esophageus cyst removal  YRS AGO  . [place]  . ORIF ANKLE FRACTURE Left 05/03/2017  . ORIF ANKLE FRACTURE Left 05/03/2017   Procedure: OPEN REDUCTION INTERNAL FIXATION (ORIF) BIMALLEOLAR  ANKLE FRACTURE;  Surgeon: Wylene Simmer, MD;  Location: [facility];  Service: Orthopedics;  Laterality: Left;  . PACEMAKER IMPLANT N/A 09/16/2017   Procedure: PACEMAKER IMPLANT;  Surgeon: Constance Haw, MD;  Location: [facility];  Service: Cardiovascular;  Laterality: N/A;  . PROSTATE BIOPSY  05/11/11   Adenocarcinoma (MD OFFICE)  . RADIOACTIVE SEED IMPLANT  11/15/2011   Procedure: RADIOACTIVE SEED IMPLANT;  Surgeon: Dutch Gray, MD;  Location: [facility];  Service: Urology;  Laterality: N/A;  Total number of seeds - 52  . REPAIR RIGHT INGUINAL HERNIA W/ MESH  09-10-1999  . TOTAL HIP ARTHROPLASTY Right 03/20/2013   Procedure: RIGHT TOTAL HIP ARTHROPLASTY ANTERIOR APPROACH;  Surgeon: [name], MD;  Location: [facility];  Service: Orthopedics;  Laterality: Right;  . TOTAL HIP REVISION Right 05/14/2013   Procedure: open reduction internal fixation REVISION RIGHT HIP ;  Surgeon: [name], MD;  Location: [facility];  Service: Orthopedics;  Laterality: Right;  . UMBILICAL HERNIA REPAIR  1998   epigastric     Family History  Problem Relation Age of Onset  . Hypertension Mother   . Prostate cancer Brother        surgery/cured  . Lung cancer Brother        new primary/same brother     Social History   Socioeconomic History  . Marital status: Married    Spouse name: Alison Murray  . Number of children: 4  . Years of education: College  . Highest education level: Not on file  Occupational History  . Occupation:       Employer: [employer]    Comment: Retired  Scientific laboratory technician  . Financial resource  strain: Not on file  . Food insecurity:    Worry: Not on file    Inability: Not on file  . Transportation needs:    Medical: Not on file    Non-medical: Not on file  Tobacco Use  . Smoking status: Former Smoker    Packs/day: 0.50    Years: 62.00    Pack years: 31.00    Types: Cigarettes  . Smokeless tobacco:  Never Used  . Tobacco comment: smoke one or two per day  Substance and Sexual Activity  . Alcohol use: No    Alcohol/week: 1.0 standard drinks    Types: 1 Cans of beer per week  . Drug use: No    Comment: quit smoking 02/2012  . Sexual activity: Not on file  Lifestyle  . Physical activity:    Days per week: Not on file    Minutes per session: Not on file  . Stress: Not on file  Relationships  . Social connections:    Talks on phone: Not on file    Gets together: Not on file    Attends religious service: Not on file    Active member of club or organization: Not on file    Attends meetings of clubs or organizations: Not on file    Relationship status: Not on file  . Intimate partner violence:    Fear of current or ex partner: Not on file    Emotionally abused: Not on file    Physically abused: Not on file    Forced sexual activity: Not on file  Other Topics Concern  . Not on file  Social History Narrative   Patient is Married Designer, television/film set), and lives at home with his wife 100 years   Patient has 3 daughters and 1 son.   Patient has a college education.   Patient is right-handed.   Patient drinks one cup of coffee, 1-2 cups of tea daily and rarely drinks soda.   Retired from [employer] 6\' 3"  (1.905 m)   Wt 183 lb (83 kg)   BMI 22.87 kg/m   Physical Exam:  Well appearing NAD HEENT: Unremarkable Neck:  No JVD, no thyromegally Lymphatics:  No adenopathy Back:  No CVA tenderness Lungs:  Clear with no wheezes HEART:  Regular rate rhythm, no murmurs, no rubs, no clicks Abd:  soft, positive bowel sounds, no organomegally, no rebound, no guarding Ext:  2  plus pulses, no edema, no cyanosis, no clubbing Skin:  No rashes no nodules Neuro:  CN II through XII intact, motor grossly intact  EKG - nsr with atrial pacing and LVH and RBBB  DEVICE  Normal device function.  See PaceArt for details.   Assess/Plan: 1. Sinus node dysfunction - he is stable, s/p PPM insertion.  2. PPM - his medtronic DDD PM is working normally. We will recheck in several months. 3. HTN - his a.m pressures are in the 170' and then go down. I am concerned about these though I know he has some orthastasis and asked him to try low dose toprol in the evening. It can be uptitrated as needed by Dr. Joylene Draft. 4. Diastolic heart failure - he is encouraged to increase his activity, maintain a low sodium diet and take his meds as directed.   Mikle Bosworth.D.

## 2018-01-03 ENCOUNTER — Ambulatory Visit: Payer: Medicare HMO | Admitting: Physical Therapy

## 2018-01-03 DIAGNOSIS — R2681 Unsteadiness on feet: Secondary | ICD-10-CM

## 2018-01-03 DIAGNOSIS — M6281 Muscle weakness (generalized): Secondary | ICD-10-CM

## 2018-01-03 DIAGNOSIS — R42 Dizziness and giddiness: Secondary | ICD-10-CM | POA: Diagnosis not present

## 2018-01-03 DIAGNOSIS — M25672 Stiffness of left ankle, not elsewhere classified: Secondary | ICD-10-CM | POA: Diagnosis not present

## 2018-01-03 DIAGNOSIS — R278 Other lack of coordination: Secondary | ICD-10-CM | POA: Diagnosis not present

## 2018-01-03 DIAGNOSIS — R2689 Other abnormalities of gait and mobility: Secondary | ICD-10-CM | POA: Diagnosis not present

## 2018-01-03 NOTE — Therapy (Signed)
[facility] [address] [place], [place], [address] Phone: [phone]   Fax:  [phone]  Physical Therapy Treatment  Patient Details  Name: Derek Carr MRN: [id number] Date of Birth: 05-31-1931 Referring Provider (PT): Mechele Claude, [place]   Encounter Date: 01/03/2018  PT End of Session - 01/03/18 1151    Visit Number  35    Number of Visits  39    Date for PT Re-Evaluation  01/13/18    Authorization Type  [employer] Medicare & Generic commercial 2nd    Authorization Time Period  Progress Note due visit 23    PT Start Time  1101    PT Stop Time  1145    PT Time Calculation (min)  44 min    Equipment Utilized During Treatment  Gait belt    Activity Tolerance  Patient tolerated treatment well;No increased pain    Behavior During Therapy  WFL for tasks assessed/performed       Past Medical History:  Diagnosis Date  . AAA (abdominal aortic aneurysm) ([place])   . Anemia   . Ankle fracture    left  . Aortic regurgitation   . Atherosclerosis   . Complication of anesthesia    CONFUSION - Pt's family very concerned about this  . DDD (degenerative disc disease)   . Degenerative arthritis   . Dermatitis, atopic ARMS AND LEGS  . Diverticulosis   . Erectile dysfunction   . Frequency of urination   . Glaucoma   . History of asbestos exposure   . History of radiation therapy 8/19-13-10/18/11   prostate, 78 GY  . History of shingles 2012-- BILATERAL EYES--  NO RESIDUAL  . Hyperlipidemia   . Hypertension   . Inguinal hernia   . Lumbar scoliosis   . Nocturia   . OA (osteoarthritis) of hip RIGHT  . Prostate cancer ([place]) 05/11/2011   bx=Adenocarcinoma,gleason3+4=7, 4+4=8,PSA=10.30volume=45.7cc  . Renal cyst    bilateral  . Smokers' cough ([place])   . Stroke ([place])   . Thyroid cyst     Past Surgical History:  Procedure Laterality Date  . ATTEMPTED LEFT VATS/ LEFT THORACOTOMY/ RESECTION OF THE ENORMOUS, PROBABLE  BRONCHOGENIC CYST  01-04-2005  DR Arlyce Dice  . CARDIAC CATHETERIZATION  02-24-2009  DR NASHER   MINOR CORONARY ARTERY IRREGULARITIES/ NORMAL LVSF/ EF 65-70%  . CATARACT EXTRACTION    . COLONOSCOPY W/ POLYPECTOMY    . CYSTOSCOPY  11/15/2011   Procedure: CYSTOSCOPY;  Surgeon: Dutch Gray, MD;  Location: [facility];  Service: Urology;  Laterality: N/A;  no seeds seen in bladder  . esophageus cyst removal  YRS AGO  . [place]  . ORIF ANKLE FRACTURE Left 05/03/2017  . ORIF ANKLE FRACTURE Left 05/03/2017   Procedure: OPEN REDUCTION INTERNAL FIXATION (ORIF) BIMALLEOLAR  ANKLE FRACTURE;  Surgeon: Wylene Simmer, MD;  Location: [facility];  Service: Orthopedics;  Laterality: Left;  . PACEMAKER IMPLANT N/A 09/16/2017   Procedure: PACEMAKER IMPLANT;  Surgeon: Constance Haw, MD;  Location: [facility];  Service: Cardiovascular;  Laterality: N/A;  . PROSTATE BIOPSY  05/11/11   Adenocarcinoma (MD OFFICE)  . RADIOACTIVE SEED IMPLANT  11/15/2011   Procedure: RADIOACTIVE SEED IMPLANT;  Surgeon: Dutch Gray, MD;  Location: [facility];  Service: Urology;  Laterality: N/A;  Total number of seeds - 52  . REPAIR RIGHT INGUINAL HERNIA W/ MESH  09-10-1999  . TOTAL HIP ARTHROPLASTY Right 03/20/2013   Procedure:  RIGHT TOTAL HIP ARTHROPLASTY ANTERIOR APPROACH;  Surgeon: [name], MD;  [facility];  Service: Orthopedics;  Laterality: Right;  . TOTAL HIP REVISION Right 05/14/2013   Procedure: open reduction internal fixation REVISION RIGHT HIP ;  Surgeon: [name], MD;  [facility];  Service: Orthopedics;  Laterality: Right;  . UMBILICAL HERNIA REPAIR  1998   epigastric    There were no vitals filed for this visit.  Subjective Assessment - 01/03/18 1104    Subjective  Pt has not fallen or changes. He states his dizziness is the same, "its not good and its not bad, its just there."    Pertinent History  left ankle fracture, TIAs, DDD, Lumbar  scoliosis, Glaucoma, cataract surgery, HTN, right THR 03/20/13 with revision 05/14/13, right femur fracture, dementia/Alzheimers    Limitations  Standing;Walking;House hold activities    Patient Stated Goals  Patient wants to improve his balance & walking    Currently in Pain?  No/denies            [facility] Adult PT Treatment/Exercise - 01/03/18 1106      Ambulation/Gait   Ambulation/Gait  Yes    Ambulation/Gait Assistance  5: Supervision;4: Min guard    Ambulation/Gait Assistance Details  Cues for heel strike and safe use of cane.    Ambulation Distance (Feet)  230 Feet   230x1,115x1,230x1   Assistive device  Straight cane    Gait Pattern  Step-through pattern;Decreased stance time - left;Decreased weight shift to left;Lateral hip instability;Wide base of support;Decreased stride length;Decreased step length - left;Poor foot clearance - right    Ambulation Surface  Level;Indoor    Stairs  Yes    Stairs Assistance  4: Min guard    Stairs Assistance Details (indicate cue type and reason)  Cues for sequence and safe use of cane and railing.    Stair Management Technique  One rail Left;With cane    Number of Stairs  4    Height of Stairs  6    Ramp  --   cane indoors   Pre-Gait Activities  Ambulation through clinic while scanning for objects and spped changes for 115 feet. Pt experienced no LOB. Pt ambulated in // bars over compliant surface x5 laps with one UE for support.    Gait Comments  Pt initiates good use of cane and stable but as the patient fatigues, requires increased cueing for safe use of cane and becomes unsteady.      High Level Balance   High Level Balance Activities  Other (comment)    High Level Balance Comments  In // bars: Pt performed standing balance activities while on compliant surface with UE assist progressing form static to head turns for 30 sec each; Pt performed stepping strategy BIL x10 each with UE assist; Pt performed step ups with 6' BIL x10 each with UE  assist.               PT Short Term Goals - 12/12/17 1142      PT SHORT TERM GOAL #1   Title  Pt will be independent with updated HEP exercises (All STGs Target Date 12/16/2017)    Time  1    Period  Months    Status  On-going      PT SHORT TERM GOAL #2   Title  Patient ambulates 500' outdoor surfaces including grass with cane with supervision.     Baseline  12/12/2016 Pt required seated rest break in  the lobby due to instability requiring min assist to maintain balance.    Time  1    Period  Months    Status  Not Met      PT SHORT TERM GOAL #3   Title  Timed Up & Go with cane <15sec with supervision.     Baseline  11/18 21.25 sec with cane    Status  Not Met      PT SHORT TERM GOAL #4   Title  Berg Balance >36/56    Baseline  11/18 39/56, imroved from 36/56    Time  1    Period  Months    Status  Partially Met      PT SHORT TERM GOAL #5   Title  Patient negotiates ramps & curbs with cane with supervision    Baseline  12/12/2017 Pt performed ramps/curbs with supervision.    Time  1    Period  Months    Status  On-going        PT Long Term Goals - 10/17/17 1822      PT LONG TERM GOAL #1   Title  Pt will demonstrate independence with ongoing HEP program and fitness plan (All LTGs Target Date 01/13/2018)    Time  90    Period  Days    Status  On-going    Target Date  01/13/18      PT LONG TERM GOAL #2   Title  Patient ambulates Modified Independent 500' outdoors including grass, pavement, curbs, ramps with cane to improve community ambulatory skills    Time  90    Period  Days    Status  On-going    Target Date  01/13/18      PT LONG TERM GOAL #3   Title  Berg Balance >/= 45/56 to indicate lower fall risk.     Time  90    Period  Days    Status  On-going    Target Date  01/13/18      PT LONG TERM GOAL #4   Title  Timed Up-Go time with cane <13.5 seconds to indicate lower fall risk.     Time  90    Period  Days    Status  On-going    Target  Date  01/13/18      PT LONG TERM GOAL #5   Title  Dynamic Gait Index with cane >12/24 to indicate lower fall risk.     Time  90    Period  Days    Status  New    Target Date  01/13/18            Plan - 01/03/18 1152    Clinical Impression Statement  Therapy today focused on dynamic gait training with cane as well as activity tolerance. Pt is able to walk 2 laps safely with cane but becomes unsteady. Pt was able to perform head turns and speed changes with very little differnce in gait.  Pt would benefit from further physical therapy to increase endurance and balance.    Clinical Presentation  Evolving    Clinical Decision Making  Moderate    Rehab Potential  Good    PT Duration  12 weeks    PT Treatment/Interventions  Gait training;Functional mobility training;Neuromuscular re-education;Balance training;Therapeutic exercise;Therapeutic activities;Patient/family education;ADLs/Self Care Home Management;Stair training;Orthotic Fit/Training;Manual techniques;Vestibular;Canalith Repostioning    PT Next Visit Plan  Continue to work towards [employer] and Agree with Plan of  Care  Patient       Patient will benefit from skilled therapeutic intervention in order to improve the following deficits and impairments:  Abnormal gait, Decreased activity tolerance, Decreased balance, Decreased endurance, Decreased knowledge of use of DME, Decreased mobility, Decreased range of motion, Decreased safety awareness, Decreased strength, Impaired flexibility, Postural dysfunction, Dizziness  Visit Diagnosis: Unsteadiness on feet  Muscle weakness (generalized)     Problem List Patient Active Problem List   Diagnosis Date Noted  . Bradycardia 09/16/2017  . CKD (chronic kidney disease), stage III ([place]) 09/15/2017  . Near syncope 09/14/2017  . Post-operative pain   . Dementia without behavioral disturbance ([place])   . TIA (transient ischemic attack)   . Reactive hypertension   .  Bimalleolar fracture of left ankle 05/03/2017  . Axonal neuropathy 03/21/2017  . Right sided weakness 03/01/2017  . Cigarette smoker 06/16/2016  . Dyspnea on exertion 06/15/2016  . Alzheimer disease ([place]) 01/07/2014  . CSA (central sleep apnea) 10/05/2013  . Syncope 06/26/2013  . COPD GOLD II 06/25/2013  . BPH (benign prostatic hyperplasia) 05/18/2013  . Glaucoma 05/18/2013  . S/P right TH revision 05/14/2013  . Constipation 03/26/2013  . Osteoporosis 03/26/2013  . S/P right THA, AA 03/20/2013  . Hyperlipidemia   . Erectile dysfunction   . History of asbestos exposure   . Nocturia   . History of shingles   . Dermatitis, atopic   . OA (osteoarthritis) of hip   . Arthritis   . Frequency of urination   . Lumbar scoliosis   . Malignant neoplasm of prostate ([place]) 06/07/2011  . 84 year old gentleman with stage T3 adenocarcinoma prostate with Gleason score 4+4 and PSA of 10.3 05/11/2011    Leslieann Whisman SPTA 01/03/2018, 11:55 AM  [facility] [address] [place], [place], [address] Phone: [phone]   Fax:  [phone]  Name: Derek Carr MRN: [id number] Date of Birth: 11-23-1931

## 2018-01-04 DIAGNOSIS — Z125 Encounter for screening for malignant neoplasm of prostate: Secondary | ICD-10-CM | POA: Diagnosis not present

## 2018-01-04 DIAGNOSIS — E7849 Other hyperlipidemia: Secondary | ICD-10-CM | POA: Diagnosis not present

## 2018-01-04 DIAGNOSIS — M810 Age-related osteoporosis without current pathological fracture: Secondary | ICD-10-CM | POA: Diagnosis not present

## 2018-01-04 DIAGNOSIS — I10 Essential (primary) hypertension: Secondary | ICD-10-CM | POA: Diagnosis not present

## 2018-01-04 DIAGNOSIS — E041 Nontoxic single thyroid nodule: Secondary | ICD-10-CM | POA: Diagnosis not present

## 2018-01-05 ENCOUNTER — Ambulatory Visit: Payer: Medicare HMO | Admitting: Physical Therapy

## 2018-01-06 DIAGNOSIS — M5032 Other cervical disc degeneration, mid-cervical region, unspecified level: Secondary | ICD-10-CM | POA: Diagnosis not present

## 2018-01-06 DIAGNOSIS — R262 Difficulty in walking, not elsewhere classified: Secondary | ICD-10-CM | POA: Diagnosis not present

## 2018-01-06 DIAGNOSIS — M5136 Other intervertebral disc degeneration, lumbar region: Secondary | ICD-10-CM | POA: Diagnosis not present

## 2018-01-06 DIAGNOSIS — M4125 Other idiopathic scoliosis, thoracolumbar region: Secondary | ICD-10-CM | POA: Diagnosis not present

## 2018-01-08 DIAGNOSIS — G4731 Primary central sleep apnea: Secondary | ICD-10-CM | POA: Diagnosis not present

## 2018-01-08 DIAGNOSIS — R531 Weakness: Secondary | ICD-10-CM | POA: Diagnosis not present

## 2018-01-08 DIAGNOSIS — R0602 Shortness of breath: Secondary | ICD-10-CM | POA: Diagnosis not present

## 2018-01-08 DIAGNOSIS — R351 Nocturia: Secondary | ICD-10-CM | POA: Diagnosis not present

## 2018-01-08 DIAGNOSIS — S82842A Displaced bimalleolar fracture of left lower leg, initial encounter for closed fracture: Secondary | ICD-10-CM | POA: Diagnosis not present

## 2018-01-08 DIAGNOSIS — Z471 Aftercare following joint replacement surgery: Secondary | ICD-10-CM | POA: Diagnosis not present

## 2018-01-08 DIAGNOSIS — G8918 Other acute postprocedural pain: Secondary | ICD-10-CM | POA: Diagnosis not present

## 2018-01-10 ENCOUNTER — Ambulatory Visit: Payer: Medicare HMO | Admitting: Physical Therapy

## 2018-01-10 ENCOUNTER — Encounter: Payer: Self-pay | Admitting: Physical Therapy

## 2018-01-10 DIAGNOSIS — M6281 Muscle weakness (generalized): Secondary | ICD-10-CM | POA: Diagnosis not present

## 2018-01-10 DIAGNOSIS — M25672 Stiffness of left ankle, not elsewhere classified: Secondary | ICD-10-CM | POA: Diagnosis not present

## 2018-01-10 DIAGNOSIS — R278 Other lack of coordination: Secondary | ICD-10-CM | POA: Diagnosis not present

## 2018-01-10 DIAGNOSIS — R2681 Unsteadiness on feet: Secondary | ICD-10-CM

## 2018-01-10 DIAGNOSIS — R2689 Other abnormalities of gait and mobility: Secondary | ICD-10-CM | POA: Diagnosis not present

## 2018-01-10 DIAGNOSIS — R42 Dizziness and giddiness: Secondary | ICD-10-CM | POA: Diagnosis not present

## 2018-01-10 NOTE — Therapy (Signed)
[facility] [address] [place], [place], [address] Phone: [phone]   Fax:  [phone]  Physical Therapy Treatment  Patient Details  Name: Derek Carr MRN: [id number] Date of Birth: Jan 11, 1932 Referring Provider (PT): Mechele Claude, [place]   Encounter Date: 01/10/2018  PT End of Session - 01/10/18 1108    Visit Number  36    Number of Visits  39    Date for PT Re-Evaluation  01/13/18    Authorization Type  [employer] & Generic commercial 2nd    Authorization Time Period  Progress Note due visit 23    PT Start Time  1105    PT Stop Time  1145    PT Time Calculation (min)  40 min    Equipment Utilized During Treatment  Gait belt    Activity Tolerance  Patient tolerated treatment well;No increased pain    Behavior During Therapy  WFL for tasks assessed/performed       Past Medical History:  Diagnosis Date  . AAA (abdominal aortic aneurysm) ([place])   . Anemia   . Ankle fracture    left  . Aortic regurgitation   . Atherosclerosis   . Complication of anesthesia    CONFUSION - Pt's family very concerned about this  . DDD (degenerative disc disease)   . Degenerative arthritis   . Dermatitis, atopic ARMS AND LEGS  . Diverticulosis   . Erectile dysfunction   . Frequency of urination   . Glaucoma   . History of asbestos exposure   . History of radiation therapy 8/19-13-10/18/11   prostate, 27 GY  . History of shingles 2012-- BILATERAL EYES--  NO RESIDUAL  . Hyperlipidemia   . Hypertension   . Inguinal hernia   . Lumbar scoliosis   . Nocturia   . OA (osteoarthritis) of hip RIGHT  . Prostate cancer ([place]) 05/11/2011   bx=Adenocarcinoma,gleason3+4=7, 4+4=8,PSA=10.30volume=45.7cc  . Renal cyst    bilateral  . Smokers' cough ([place])   . Stroke ([place])   . Thyroid cyst     Past Surgical History:  Procedure Laterality Date  . ATTEMPTED LEFT VATS/ LEFT THORACOTOMY/ RESECTION OF THE ENORMOUS, PROBABLE  BRONCHOGENIC CYST  01-04-2005  DR Arlyce Dice  . CARDIAC CATHETERIZATION  02-24-2009  DR NASHER   MINOR CORONARY ARTERY IRREGULARITIES/ NORMAL LVSF/ EF 65-70%  . CATARACT EXTRACTION    . COLONOSCOPY W/ POLYPECTOMY    . CYSTOSCOPY  11/15/2011   Procedure: CYSTOSCOPY;  Surgeon: Dutch Gray, MD;  Location: [facility];  Service: Urology;  Laterality: N/A;  no seeds seen in bladder  . esophageus cyst removal  YRS AGO  . [place]  . ORIF ANKLE FRACTURE Left 05/03/2017  . ORIF ANKLE FRACTURE Left 05/03/2017   Procedure: OPEN REDUCTION INTERNAL FIXATION (ORIF) BIMALLEOLAR  ANKLE FRACTURE;  Surgeon: Wylene Simmer, MD;  Location: [facility];  Service: Orthopedics;  Laterality: Left;  . PACEMAKER IMPLANT N/A 09/16/2017   Procedure: PACEMAKER IMPLANT;  Surgeon: Constance Haw, MD;  Location: [facility];  Service: Cardiovascular;  Laterality: N/A;  . PROSTATE BIOPSY  05/11/11   Adenocarcinoma ([facility])  . RADIOACTIVE SEED IMPLANT  11/15/2011   Procedure: RADIOACTIVE SEED IMPLANT;  Surgeon: Dutch Gray, MD;  Location: [facility];  Service: Urology;  Laterality: N/A;  Total number of seeds - 52  . REPAIR RIGHT INGUINAL HERNIA W/ MESH  09-10-1999  . TOTAL HIP ARTHROPLASTY Right 03/20/2013   Procedure:  RIGHT TOTAL HIP ARTHROPLASTY ANTERIOR APPROACH;  Surgeon: [name], MD;  Location: [facility];  Service: Orthopedics;  Laterality: Right;  . TOTAL HIP REVISION Right 05/14/2013   Procedure: open reduction internal fixation REVISION RIGHT HIP ;  Surgeon: [name], MD;  Location: [facility];  Service: Orthopedics;  Laterality: Right;  . UMBILICAL HERNIA REPAIR  1998   epigastric    There were no vitals filed for this visit.  Subjective Assessment - 01/10/18 1107    Subjective  No new complaints. No falls or pain. Still occasionally gets dizzy in the mornings, does not last long and it clears up.     Pertinent History  left ankle fracture, TIAs, DDD, Lumbar  scoliosis, Glaucoma, cataract surgery, HTN, right THR 03/20/13 with revision 05/14/13, right femur fracture, dementia/Alzheimers    Limitations  Standing;Walking;House hold activities    Patient Stated Goals  Patient wants to improve his balance & walking    Currently in Pain?  No/denies    Pain Score  0-No pain         [facility] PT Assessment - 01/10/18 1114      Berg Balance Test   Sit to Stand  Able to stand  independently using hands    Standing Unsupported  Able to stand safely 2 minutes    Sitting with Back Unsupported but Feet Supported on Floor or Stool  Able to sit safely and securely 2 minutes    Stand to Sit  Sits safely with minimal use of hands    Transfers  Able to transfer safely, definite need of hands    Standing Unsupported with Eyes Closed  Able to stand 10 seconds with supervision    Standing Ubsupported with Feet Together  Able to place feet together independently and stand 1 minute safely    From Standing, Reach Forward with Outstretched Arm  Can reach forward >5 cm safely (2")   3 inches, ? limited by fear   From Standing Position, Pick up Object from Floor  Able to pick up shoe, needs supervision    From Standing Position, Turn to Look Behind Over each Shoulder  Looks behind one side only/other side shows less weight shift   right > left with cueing   Turn 360 Degrees  Needs close supervision or verbal cueing    Standing Unsupported, Alternately Place Feet on Step/Stool  Able to complete >2 steps/needs minimal assist    Standing Unsupported, One Foot in Front  Able to plae foot ahead of the other independently and hold 30 seconds    Standing on One Leg  Tries to lift leg/unable to hold 3 seconds but remains standing independently    Total Score  39    Berg comment:  39/56, no change from last assessment on 12/12/17      Timed Up and Go Test   TUG  Normal TUG    Normal TUG (seconds)  21.81   with cane, min guard assist for balance.          [facility]  Treatment/Exercise - 01/10/18 1133      Transfers   Transfers  Sit to Stand;Stand to Sit    Sit to Stand  5: Supervision;With upper extremity assist;From bed;From chair/3-in-1    Stand to Sit  5: Supervision;With upper extremity assist;To bed;To chair/3-in-1      Ambulation/Gait   Ambulation/Gait  Yes    Ambulation/Gait Assistance  5: Supervision;4: Min guard    Ambulation/Gait Assistance Details  supervision with RW to enter/exit gym for 100 feet x2; use of straight cane with rubber quad tip for gait in session and testing with mostly min guard assist for balance.     Ambulation Distance (Feet)  100 Feet   x2, 115 x2   Assistive device  Straight cane   cane with rubber quad tip   Gait Pattern  Step-through pattern;Decreased stance time - left;Decreased weight shift to left;Lateral hip instability;Wide base of support;Decreased stride length;Decreased step length - left;Poor foot clearance - right    Ambulation Surface  Level;Indoor               PT Short Term Goals - 12/12/17 1142      PT SHORT TERM GOAL #1   Title  Pt will be independent with updated HEP exercises (All STGs Target Date 12/16/2017)    Time  1    Period  Months    Status  On-going      PT SHORT TERM GOAL #2   Title  Patient ambulates 500' outdoor surfaces including grass with cane with supervision.     Baseline  12/12/2016 Pt required seated rest break in the lobby due to instability requiring min assist to maintain balance.    Time  1    Period  Months    Status  Not Met      PT SHORT TERM GOAL #3   Title  Timed Up & Go with cane <15sec with supervision.     Baseline  11/18 21.25 sec with cane    Status  Not Met      PT SHORT TERM GOAL #4   Title  Berg Balance >36/56    Baseline  11/18 39/56, imroved from 36/56    Time  1    Period  Months    Status  Partially Met      PT SHORT TERM GOAL #5   Title  Patient negotiates ramps & curbs with cane with supervision    Baseline  12/12/2017 Pt  performed ramps/curbs with supervision.    Time  1    Period  Months    Status  On-going        PT Long Term Goals - 01/10/18 1138      PT LONG TERM GOAL #1   Title  Pt will demonstrate independence with ongoing HEP program and fitness plan (All LTGs Target Date 01/13/2018)    Time  90    Period  Days    Status  On-going      PT LONG TERM GOAL #2   Title  Patient ambulates Modified Independent 500' outdoors including grass, pavement, curbs, ramps with cane to improve community ambulatory skills    Time  90    Period  Days    Status  On-going      PT LONG TERM GOAL #3   Title  Berg Balance >/= 45/56 to indicate lower fall risk.     Baseline  01/10/18: 39/56 scored today, no change from last assessment in November    Status  Not Met      PT LONG TERM GOAL #4   Title  Timed Up-Go time with cane <13.5 seconds to indicate lower fall risk.     Baseline  01/10/18: 21.81 sec's with cane with min guard assist, increased time from last assessment in November    Status  Not Met      PT LONG TERM GOAL #5   Title  Dynamic  Gait Index with cane >12/24 to indicate lower fall risk.     Time  90    Period  Days    Status  On-going            Plan - 01/10/18 1108    Clinical Impression Statement  Today's skilled session began to address LTGs with the Berg Balance Test and Timed Up and Go goals not met. Pt continues to need supervision to min assist with use of cane with gait. Will plan to assess remaining goals at next visit.     Rehab Potential  Good    PT Duration  12 weeks    PT Treatment/Interventions  Gait training;Functional mobility training;Neuromuscular re-education;Balance training;Therapeutic exercise;Therapeutic activities;Patient/family education;ADLs/Self Care Home Management;Stair training;Orthotic Fit/Training;Manual techniques;Vestibular;Canalith Repostioning    PT Next Visit Plan  assess remaining LTGs for anticpated discharge    PT Home Exercise Plan  [id number]      Consulted and Agree with Plan of Care  Patient       Patient will benefit from skilled therapeutic intervention in order to improve the following deficits and impairments:  Abnormal gait, Decreased activity tolerance, Decreased balance, Decreased endurance, Decreased knowledge of use of DME, Decreased mobility, Decreased range of motion, Decreased safety awareness, Decreased strength, Impaired flexibility, Postural dysfunction, Dizziness  Visit Diagnosis: Unsteadiness on feet  Muscle weakness (generalized)  Other abnormalities of gait and mobility     Problem List Patient Active Problem List   Diagnosis Date Noted  . Bradycardia 09/16/2017  . CKD (chronic kidney disease), stage III ([place]) 09/15/2017  . Near syncope 09/14/2017  . Post-operative pain   . Dementia without behavioral disturbance ([place])   . TIA (transient ischemic attack)   . Reactive hypertension   . Bimalleolar fracture of left ankle 05/03/2017  . Axonal neuropathy 03/21/2017  . Right sided weakness 03/01/2017  . Cigarette smoker 06/16/2016  . Dyspnea on exertion 06/15/2016  . Alzheimer disease ([place]) 01/07/2014  . CSA (central sleep apnea) 10/05/2013  . Syncope 06/26/2013  . COPD GOLD II 06/25/2013  . BPH (benign prostatic hyperplasia) 05/18/2013  . Glaucoma 05/18/2013  . S/P right TH revision 05/14/2013  . Constipation 03/26/2013  . Osteoporosis 03/26/2013  . S/P right THA, AA 03/20/2013  . Hyperlipidemia   . Erectile dysfunction   . History of asbestos exposure   . Nocturia   . History of shingles   . Dermatitis, atopic   . OA (osteoarthritis) of hip   . Arthritis   . Frequency of urination   . Lumbar scoliosis   . Malignant neoplasm of prostate ([place]) 06/07/2011  . 84 year old gentleman with stage T3 adenocarcinoma prostate with Gleason score 4+4 and PSA of 10.3 05/11/2011    Willow Ora, PTA, [facility] [address], [place], [place]  [address] [phone] 01/10/18, 2:31 PM   Name: Derek Carr MRN: [id number] Date of Birth: December 01, 1931

## 2018-01-12 ENCOUNTER — Ambulatory Visit: Payer: Medicare HMO | Admitting: Physical Therapy

## 2018-01-12 DIAGNOSIS — I672 Cerebral atherosclerosis: Secondary | ICD-10-CM | POA: Diagnosis not present

## 2018-01-12 DIAGNOSIS — Z950 Presence of cardiac pacemaker: Secondary | ICD-10-CM | POA: Diagnosis not present

## 2018-01-12 DIAGNOSIS — R42 Dizziness and giddiness: Secondary | ICD-10-CM | POA: Diagnosis not present

## 2018-01-12 DIAGNOSIS — C61 Malignant neoplasm of prostate: Secondary | ICD-10-CM | POA: Diagnosis not present

## 2018-01-12 DIAGNOSIS — N183 Chronic kidney disease, stage 3 (moderate): Secondary | ICD-10-CM | POA: Diagnosis not present

## 2018-01-12 DIAGNOSIS — G4739 Other sleep apnea: Secondary | ICD-10-CM | POA: Diagnosis not present

## 2018-01-12 DIAGNOSIS — J449 Chronic obstructive pulmonary disease, unspecified: Secondary | ICD-10-CM | POA: Diagnosis not present

## 2018-01-12 DIAGNOSIS — Z23 Encounter for immunization: Secondary | ICD-10-CM | POA: Diagnosis not present

## 2018-01-12 DIAGNOSIS — R001 Bradycardia, unspecified: Secondary | ICD-10-CM | POA: Diagnosis not present

## 2018-01-12 DIAGNOSIS — R82998 Other abnormal findings in urine: Secondary | ICD-10-CM | POA: Diagnosis not present

## 2018-01-12 DIAGNOSIS — Z0000 Encounter for general adult medical examination without abnormal findings: Secondary | ICD-10-CM | POA: Diagnosis not present

## 2018-01-12 DIAGNOSIS — R1319 Other dysphagia: Secondary | ICD-10-CM | POA: Diagnosis not present

## 2018-01-14 LAB — CUP PACEART INCLINIC DEVICE CHECK
Battery Remaining Longevity: 166 mo
Battery Voltage: 3.17 V
Brady Statistic AP VP Percent: 0.03 %
Brady Statistic AP VS Percent: 74.98 %
Brady Statistic AS VP Percent: 0.01 %
Brady Statistic RA Percent Paced: 74.79 %
Brady Statistic RV Percent Paced: 0.04 %
Date Time Interrogation Session: 20191209175229
Implantable Lead Implant Date: 20190823
Implantable Lead Implant Date: 20190823
Implantable Lead Location: 753859
Implantable Lead Location: 753860
Implantable Lead Model: 5076
Implantable Lead Model: 5076
Implantable Pulse Generator Implant Date: 20190823
Lead Channel Impedance Value: 342 Ohm
Lead Channel Impedance Value: 418 Ohm
Lead Channel Impedance Value: 456 Ohm
Lead Channel Pacing Threshold Amplitude: 0.5 V
Lead Channel Pacing Threshold Amplitude: 0.75 V
Lead Channel Pacing Threshold Pulse Width: 0.4 ms
Lead Channel Pacing Threshold Pulse Width: 0.4 ms
Lead Channel Sensing Intrinsic Amplitude: 11.5 mV
Lead Channel Sensing Intrinsic Amplitude: 16.75 mV
Lead Channel Sensing Intrinsic Amplitude: 3.25 mV
Lead Channel Sensing Intrinsic Amplitude: 4.75 mV
Lead Channel Setting Pacing Amplitude: 1.5 V
Lead Channel Setting Pacing Amplitude: 2.5 V
Lead Channel Setting Pacing Pulse Width: 0.4 ms
Lead Channel Setting Sensing Sensitivity: 1.2 mV
MDC IDC MSMT LEADCHNL RA IMPEDANCE VALUE: 323 Ohm
MDC IDC STAT BRADY AS VS PERCENT: 24.98 %

## 2018-01-16 ENCOUNTER — Other Ambulatory Visit: Payer: Self-pay | Admitting: Orthopedic Surgery

## 2018-01-16 DIAGNOSIS — M4125 Other idiopathic scoliosis, thoracolumbar region: Secondary | ICD-10-CM

## 2018-01-16 DIAGNOSIS — G4733 Obstructive sleep apnea (adult) (pediatric): Secondary | ICD-10-CM | POA: Diagnosis not present

## 2018-01-16 DIAGNOSIS — M5032 Other cervical disc degeneration, mid-cervical region, unspecified level: Secondary | ICD-10-CM

## 2018-01-16 DIAGNOSIS — R262 Difficulty in walking, not elsewhere classified: Secondary | ICD-10-CM

## 2018-01-16 DIAGNOSIS — M5136 Other intervertebral disc degeneration, lumbar region: Secondary | ICD-10-CM

## 2018-01-19 ENCOUNTER — Telehealth: Payer: Self-pay | Admitting: Internal Medicine

## 2018-01-19 MED ORDER — UMECLIDINIUM-VILANTEROL 62.5-25 MCG/INH IN AEPB: INHALATION_SPRAY | RESPIRATORY_TRACT | 11 refills | Status: DC

## 2018-01-19 NOTE — Telephone Encounter (Signed)
umeclidinium-vilanterol (ANORO ELLIPTA) 62.5-25 MCG/INH AEPB  11 ordered       Summary: Only open the device one time and then take your two separate drags to be sure you get it all, Print  Start: 12/16/2017 Ord/Sold: 12/16/2017 (O) Report Taking:  Long-term:  Pharmacy: [id number] - [place], [place] to ask the patient if he had the prescription that was printed for him. I had to leave a message we will call back.

## 2018-01-19 NOTE — Telephone Encounter (Signed)
Patient returned phone call. °

## 2018-01-19 NOTE — Telephone Encounter (Signed)
Patient has an MRI-compatible system (PPM and leads). His [employer] patient ID card should have this information on it. He will need to bring that card with him to wherever he is going to for his MRI. He will also need to make the facility aware of his PPM when he schedules the MRI.

## 2018-01-19 NOTE — Telephone Encounter (Signed)
Patient's PCP wants patient to have an MRI but due to his pacemaker they aren't sure that it is safe for him to have one done. Wife is calling to see if he can have a MRI safely.

## 2018-01-19 NOTE — Telephone Encounter (Signed)
Spoke w/ pt daughter (DPR on file). Informed her that pt implanted device is MRI safe. Informed her to take pt ID card w/ her to the MRI appt and to inform the staff at the facility of his device when she schedules the MRI. Pt daughter verbalized understanding.

## 2018-01-19 NOTE — Telephone Encounter (Signed)
Wife states she was unable to find printed Rx and needs this sent to [facility] on file in [place]. Aware Rx has been sent and nothing more needed at this time.

## 2018-01-20 ENCOUNTER — Telehealth: Payer: Self-pay | Admitting: Internal Medicine

## 2018-01-20 NOTE — Telephone Encounter (Signed)
[employer] MRI clearance faxed to [facility] with confirmation of fax received.

## 2018-01-20 NOTE — Telephone Encounter (Signed)
New Message   Pt is needing an MRI and the doctor is requesting a letter stating he can have the MRI with his pacemaker

## 2018-01-24 ENCOUNTER — Encounter: Payer: Self-pay | Admitting: Physical Therapy

## 2018-01-24 ENCOUNTER — Other Ambulatory Visit (HOSPITAL_COMMUNITY): Payer: Self-pay | Admitting: Orthopedic Surgery

## 2018-01-24 ENCOUNTER — Ambulatory Visit: Payer: Medicare HMO | Admitting: Physical Therapy

## 2018-01-24 DIAGNOSIS — M6281 Muscle weakness (generalized): Secondary | ICD-10-CM | POA: Diagnosis not present

## 2018-01-24 DIAGNOSIS — M4125 Other idiopathic scoliosis, thoracolumbar region: Secondary | ICD-10-CM

## 2018-01-24 DIAGNOSIS — R2689 Other abnormalities of gait and mobility: Secondary | ICD-10-CM | POA: Diagnosis not present

## 2018-01-24 DIAGNOSIS — R42 Dizziness and giddiness: Secondary | ICD-10-CM

## 2018-01-24 DIAGNOSIS — R262 Difficulty in walking, not elsewhere classified: Secondary | ICD-10-CM

## 2018-01-24 DIAGNOSIS — M25672 Stiffness of left ankle, not elsewhere classified: Secondary | ICD-10-CM | POA: Diagnosis not present

## 2018-01-24 DIAGNOSIS — R2681 Unsteadiness on feet: Secondary | ICD-10-CM

## 2018-01-24 DIAGNOSIS — R278 Other lack of coordination: Secondary | ICD-10-CM

## 2018-01-24 DIAGNOSIS — M5136 Other intervertebral disc degeneration, lumbar region: Secondary | ICD-10-CM

## 2018-01-24 DIAGNOSIS — M5032 Other cervical disc degeneration, mid-cervical region, unspecified level: Secondary | ICD-10-CM

## 2018-01-25 NOTE — Therapy (Signed)
[facility] [address] [place], [place], [address] Phone: [phone]   Fax:  [phone]  Physical Therapy Treatment, Progress Note & Recertification Evaluation  Patient Details  Name: Derek Carr MRN: [id number] Date of Birth: 02/25/1931 Referring Provider (PT): Mechele Claude, [place]   Encounter Date: 01/24/2018   Progress Note Reporting Period 12/19/2017 to 01/24/2018  See note below for Objective Data and Assessment of Progress/Goals.        PT End of Session - 01/24/18 1347    Visit Number  37   10 visit note at visit 37   Number of Visits  57    Date for PT Re-Evaluation  04/24/18    Authorization Type  [employer] & Generic commercial 2nd    PT Start Time  1230    PT Stop Time  1315    PT Time Calculation (min)  45 min    Equipment Utilized During Treatment  Gait belt    Activity Tolerance  Patient tolerated treatment well;No increased pain;Patient limited by fatigue    Behavior During Therapy  [facility] for tasks assessed/performed       Past Medical History:  Diagnosis Date  . AAA (abdominal aortic aneurysm) ([employer])   . Anemia   . Ankle fracture    left  . Aortic regurgitation   . Atherosclerosis   . Complication of anesthesia    CONFUSION - Pt's family very concerned about this  . DDD (degenerative disc disease)   . Degenerative arthritis   . Dermatitis, atopic ARMS AND LEGS  . Diverticulosis   . Erectile dysfunction   . Frequency of urination   . Glaucoma   . History of asbestos exposure   . History of radiation therapy 8/19-13-10/18/11   prostate, 54 GY  . History of shingles 2012-- BILATERAL EYES--  NO RESIDUAL  . Hyperlipidemia   . Hypertension   . Inguinal hernia   . Lumbar scoliosis   . Nocturia   . OA (osteoarthritis) of hip RIGHT  . Prostate cancer ([place]) 05/11/2011   bx=Adenocarcinoma,gleason3+4=7, 4+4=8,PSA=10.30volume=45.7cc  . Renal cyst    bilateral  . Smokers' cough  ([place])   . Stroke ([place])   . Thyroid cyst     Past Surgical History:  Procedure Laterality Date  . ATTEMPTED LEFT VATS/ LEFT THORACOTOMY/ RESECTION OF THE ENORMOUS, PROBABLE BRONCHOGENIC CYST  01-04-2005  DR Arlyce Dice  . CARDIAC CATHETERIZATION  02-24-2009  DR NASHER   MINOR CORONARY ARTERY IRREGULARITIES/ NORMAL LVSF/ EF 65-70%  . CATARACT EXTRACTION    . COLONOSCOPY W/ POLYPECTOMY    . CYSTOSCOPY  11/15/2011   Procedure: CYSTOSCOPY;  Surgeon: Dutch Gray, MD;  Location: [facility];  Service: Urology;  Laterality: N/A;  no seeds seen in bladder  . esophageus cyst removal  YRS AGO  . [place]  . ORIF ANKLE FRACTURE Left 05/03/2017  . ORIF ANKLE FRACTURE Left 05/03/2017   Procedure: OPEN REDUCTION INTERNAL FIXATION (ORIF) BIMALLEOLAR  ANKLE FRACTURE;  Surgeon: Wylene Simmer, MD;  Location: [facility];  Service: Orthopedics;  Laterality: Left;  . PACEMAKER IMPLANT N/A 09/16/2017   Procedure: PACEMAKER IMPLANT;  Surgeon: Constance Haw, MD;  Location: [facility];  Service: Cardiovascular;  Laterality: N/A;  . PROSTATE BIOPSY  05/11/11   Adenocarcinoma ([facility])  . RADIOACTIVE SEED IMPLANT  11/15/2011   Procedure: RADIOACTIVE SEED IMPLANT;  Surgeon: Dutch Gray, MD;  Location: [facility];  Service: Urology;  Laterality: N/A;  Total number of seeds - 52  . REPAIR RIGHT INGUINAL HERNIA W/ MESH  09-10-1999  . TOTAL HIP ARTHROPLASTY Right 03/20/2013   Procedure: RIGHT TOTAL HIP ARTHROPLASTY ANTERIOR APPROACH;  Surgeon: [name], MD;  Location: [facility];  Service: Orthopedics;  Laterality: Right;  . TOTAL HIP REVISION Right 05/14/2013   Procedure: open reduction internal fixation REVISION RIGHT HIP ;  Surgeon: [name], MD;  Location: [facility];  Service: Orthopedics;  Laterality: Right;  . UMBILICAL HERNIA REPAIR  1998   epigastric    There were no vitals filed for this visit.  Subjective Assessment - 01/24/18 1233    Subjective  No  falls. He used to exercise mainly water therapy & a balance class at [facility] before he needed PT.     Pertinent History  left ankle fracture, TIAs, DDD, Lumbar scoliosis, Glaucoma, cataract surgery, HTN, right THR 03/20/13 with revision 05/14/13, right femur fracture, dementia/Alzheimers    Limitations  Standing;Walking;House hold activities    Patient Stated Goals  Patient wants to improve his balance & walking    Currently in Pain?  No/denies         [facility] PT Assessment - 01/24/18 1230      Transfers   Transfers  Sit to Stand;Stand to Sit    Sit to Stand  5: Supervision;With upper extremity assist;From bed;From chair/3-in-1   uses back of legs against chair to stabilize & close supervi   Stand to Sit  5: Supervision;With upper extremity assist;With armrests;To chair/3-in-1;To bed      Ambulation/Gait   Ambulation/Gait  Yes    Ambulation/Gait Assistance  4: Min assist;5: Supervision   MinA cane & supervision RW   Ambulation Distance (Feet)  150 Feet   200' RW & 100' cane   Assistive device  Rolling walker;Straight cane   quad tip on cane   Gait Pattern  Step-through pattern;Decreased stance time - left;Decreased step length - right;Decreased weight shift to left;Left hip hike;Antalgic;Lateral hip instability;Trunk flexed    Ambulation Surface  Indoor;Level    Gait velocity  2.08 ft/sec cane   10/17/17 was 2.01 ft/sec cane minA, 1.81 ft/sec RW   Stairs  Yes    Stairs Assistance  5: Supervision    Stair Management Technique  Two rails;Step to pattern;Forwards    Number of Stairs  4    Ramp  4: Min assist   cane quad tip   Curb  4: Min assist   cane quad tip     Berg Balance Test   Sit to Stand  Able to stand  independently using hands    Standing Unsupported  Able to stand safely 2 minutes    Sitting with Back Unsupported but Feet Supported on Floor or Stool  Able to sit safely and securely 2 minutes    Stand to Sit  Sits safely with minimal use of hands    Transfers  Able  to transfer safely, definite need of hands    Standing Unsupported with Eyes Closed  Able to stand 10 seconds with supervision    Standing Ubsupported with Feet Together  Able to place feet together independently and stand 1 minute safely    From Standing, Reach Forward with Outstretched Arm  Can reach forward >5 cm safely (2")    From Standing Position, Pick up Object from Floor  Able to pick up shoe, needs supervision    From Standing Position, Turn to Look Behind Over each  Shoulder  Turn sideways only but maintains balance    Turn 360 Degrees  Needs close supervision or verbal cueing    Standing Unsupported, Alternately Place Feet on Step/Stool  Able to complete >2 steps/needs minimal assist    Standing Unsupported, One Foot in Front  Able to take small step independently and hold 30 seconds    Standing on One Leg  Tries to lift leg/unable to hold 3 seconds but remains standing independently    Total Score  37      Dynamic Gait Index   Level Surface  Mild Impairment    Change in Gait Speed  Moderate Impairment    Gait with Horizontal Head Turns  Moderate Impairment    Gait with Vertical Head Turns  Moderate Impairment    Gait and Pivot Turn  Moderate Impairment    Step Over Obstacle  Moderate Impairment    Step Around Obstacles  Moderate Impairment    Steps  Moderate Impairment    Total Score  9      Timed Up and Go Test   Normal TUG (seconds)  15.2   12/31 15.20sec cane SBA 10/17/2017 was 21.81sec w/cane & minA                            PT Short Term Goals - 01/24/18 1409      PT SHORT TERM GOAL #1   Title  Pt will be independent with updated HEP exercises (All STGs Target Date 02/24/2018)    Time  1    Period  Months    Status  Revised    Target Date  02/24/18      PT SHORT TERM GOAL #2   Title  Patient ambulates 500' outdoor surfaces including grass with RW with supervision.     Time  1    Period  Months    Status  Revised    Target Date  02/24/18       PT SHORT TERM GOAL #3   Title  Timed Up & Go with cane <15sec with supervision.     Time  1    Period  Months    Status  On-going    Target Date  02/24/18      PT SHORT TERM GOAL #4   Title  Berg Balance >/= 39/56    Time  1    Period  Months    Status  Revised    Target Date  02/24/18      PT SHORT TERM GOAL #5   Title  Patient ambulates 100' around furniture with cane with supervision.     Time  1    Period  Months    Status  New    Target Date  02/24/18        PT Long Term Goals - 01/24/18 1353      PT LONG TERM GOAL #1   Title  Pt will demonstrate independence with ongoing HEP program and fitness plan (All LTGs Target Date 01/13/2018)    Baseline  NOT MET 01/24/2018  Patient requires cues for ongoing HEP. He wants to return to aquatic program at [facility].    Time  90    Period  Days    Status  Not Met      PT LONG TERM GOAL #2   Title  Patient ambulates Modified Independent 500' outdoors including grass, pavement, curbs, ramps with cane to  improve community ambulatory skills    Baseline  NOT MET 01/24/2018  Patient ambulates 200' with RW supervision / cues and 150' with cane with minA.     Time  90    Period  Days    Status  Not Met      PT LONG TERM GOAL #3   Title  Berg Balance >/= 45/56 to indicate lower fall risk.     Baseline  NOT MET 01/24/2018  Berg Balance improved from 22/56 to 37/56 but not to goal level yet.    Status  Not Met      PT LONG TERM GOAL #4   Title  Timed Up-Go time with cane <13.5 seconds to indicate lower fall risk.     Baseline  NOT MET 01/24/2018   TUG with cane improved from 18.54sec to 15.20sec with cane with min guard but not to goal level    Status  Not Met      PT LONG TERM GOAL #5   Title  Dynamic Gait Index with cane >12/24 to indicate lower fall risk.     Baseline  NOT MET 01/24/2018  DGI with cane improved from 5/24 to 9/24 but not to goal level    Time  90    Period  Days    Status  Not Met         PT Long Term Goals - 01/25/18 1417      PT LONG TERM GOAL #1   Title  Pt will demonstrate independence with ongoing HEP program and fitness plan including returning to aquatic class at [facility] (All LTGs Target Date 04/21/2018)    Time  3    Period  Months    Status  On-going    Target Date  04/21/18      PT LONG TERM GOAL #2   Title  Patient ambulates Modified Independent 500' outdoors including grass, pavement, curbs, ramps with LRAD to improve community ambulatory skills    Time  3    Period  Months    Status  Revised    Target Date  04/21/18      PT LONG TERM GOAL #3   Title  Berg Balance >/= 45/56 to indicate lower fall risk.     Time  3    Period  Months    Status  On-going    Target Date  04/21/18      PT LONG TERM GOAL #4   Title  Timed Up-Go time with cane <13.5 seconds to indicate lower fall risk.     Time  3    Period  Months    Status  On-going    Target Date  04/21/18      PT LONG TERM GOAL #5   Title  Patient ambulates household activities including negotiating furniture, scanning & carrying light items with cane modified independent.     Time  3    Period  Months    Status  New    Target Date  04/21/18            Plan - 01/24/18 1358    Clinical Impression Statement  Patient made progress towards LTGs but did not meet any of LTGs to level set to indicate lower fall risk. He also wants to return to exercising at [facility] especially aquatic class but is not at level that would be safe at this time. Patient appears would benefit further skilled PT to further improve  his balance & gait including participation in our aquatic program.     Rehab Potential  Good    PT Frequency  2x / week    PT Duration  12 weeks    PT Treatment/Interventions  Gait training;Functional mobility training;Neuromuscular re-education;Balance training;Therapeutic exercise;Therapeutic activities;Patient/family education;ADLs/Self Care Home Management;Stair  training;Orthotic Fit/Training;Manual techniques;Vestibular;Canalith Repostioning;Aquatic Therapy    PT Next Visit Plan  check to see when he can get on aquatic therapy PT schedule,  update HEP, gait & balance activities    PT Home Exercise Plan  [id number]     Consulted and Agree with Plan of Care  Patient       Patient will benefit from skilled therapeutic intervention in order to improve the following deficits and impairments:  Abnormal gait, Decreased activity tolerance, Decreased balance, Decreased endurance, Decreased knowledge of use of DME, Decreased mobility, Decreased range of motion, Decreased safety awareness, Decreased strength, Impaired flexibility, Postural dysfunction, Dizziness  Visit Diagnosis: Unsteadiness on feet  Muscle weakness (generalized)  Other abnormalities of gait and mobility  Other lack of coordination  Dizziness and giddiness  Stiffness of left ankle, not elsewhere classified     Problem List Patient Active Problem List   Diagnosis Date Noted  . Bradycardia 09/16/2017  . CKD (chronic kidney disease), stage III ([place]) 09/15/2017  . Near syncope 09/14/2017  . Post-operative pain   . Dementia without behavioral disturbance ([place])   . TIA (transient ischemic attack)   . Reactive hypertension   . Bimalleolar fracture of left ankle 05/03/2017  . Axonal neuropathy 03/21/2017  . Right sided weakness 03/01/2017  . Cigarette smoker 06/16/2016  . Dyspnea on exertion 06/15/2016  . Alzheimer disease ([place]) 01/07/2014  . CSA (central sleep apnea) 10/05/2013  . Syncope 06/26/2013  . COPD GOLD II 06/25/2013  . BPH (benign prostatic hyperplasia) 05/18/2013  . Glaucoma 05/18/2013  . S/P right TH revision 05/14/2013  . Constipation 03/26/2013  . Osteoporosis 03/26/2013  . S/P right THA, AA 03/20/2013  . Hyperlipidemia   . Erectile dysfunction   . History of asbestos exposure   . Nocturia   . History of shingles   . Dermatitis, atopic   . OA  (osteoarthritis) of hip   . Arthritis   . Frequency of urination   . Lumbar scoliosis   . Malignant neoplasm of prostate ([place]) 06/07/2011  . 85 year old gentleman with stage T3 adenocarcinoma prostate with Gleason score 4+4 and PSA of 10.3 05/11/2011    Abraham Entwistle PT, DPT 01/25/2018, 2:17 PM  [facility] [address] [place], [place], [address] Phone: [phone]   Fax:  [phone]  Name: Derek Carr MRN: [id number] Date of Birth: Oct 12, 1931

## 2018-01-26 ENCOUNTER — Ambulatory Visit: Payer: Medicare HMO | Admitting: Adult Health

## 2018-01-26 ENCOUNTER — Encounter: Payer: Self-pay | Admitting: Adult Health

## 2018-01-26 ENCOUNTER — Telehealth: Payer: Self-pay

## 2018-01-26 VITALS — BP 162/102 | HR 89 | Ht 75.0 in | Wt 184.0 lb

## 2018-01-26 DIAGNOSIS — F0390 Unspecified dementia without behavioral disturbance: Secondary | ICD-10-CM

## 2018-01-26 DIAGNOSIS — I10 Essential (primary) hypertension: Secondary | ICD-10-CM | POA: Diagnosis not present

## 2018-01-26 DIAGNOSIS — H539 Unspecified visual disturbance: Secondary | ICD-10-CM

## 2018-01-26 DIAGNOSIS — R69 Illness, unspecified: Secondary | ICD-10-CM | POA: Diagnosis not present

## 2018-01-26 DIAGNOSIS — R2990 Unspecified symptoms and signs involving the nervous system: Secondary | ICD-10-CM | POA: Diagnosis not present

## 2018-01-26 MED ORDER — TOPIRAMATE 25 MG PO TABS: 25.0000 mg | ORAL_TABLET | Freq: Every day | ORAL | 3 refills | Status: DC

## 2018-01-26 NOTE — Telephone Encounter (Signed)
Noted! Thank you

## 2018-01-26 NOTE — Patient Instructions (Addendum)
Your Plan:  Continue aspirin 81mg  for secondary stroke prevention  You will be called to schedule MRI of your head to ensure you have not had a new stroke  You will start topamax 25mg  nightly in case these episodes are related to seizures or migraines. If after 2 weeks you continue to experience symptoms, please call office   You will be called to schedule appointment with neuro eye doctor  When these episodes occur, please ensure you are checking blood sugar  We will check labs today to look for possible other causes    You will come back in 3 months time       Thank you for coming to see [place] at [facility]. I hope we have been able to provide you high quality care today.  You may receive a patient satisfaction survey over the next few weeks. We would appreciate your feedback and comments so that we may continue to improve ourselves and the health of our patients.

## 2018-01-26 NOTE — Progress Notes (Signed)
[facility] [address] [place]. [place] [address] [phone]       OFFICE FOLLOW-UP NOTE  [name] Date of Birth:  01-01-1932 Medical Record Number:  [id number]   HPI: Derek Carr is a 85 year old African-American gentleman who is accompanied today by his daughter. History is obtained from them as well as review of electronic medical records. I have personally reviewed imaging films.Derek Carr is an 85 y.o. male with hypertension and hyperlipidemia.  Per patient approximately 7:00 in the morning on 03/01/2017 patient went to get a right cataract surgery.  He did not stop his aspirin or Plavix for this per patient.  He then took a nap at approximately 1300 hrs. patient awoke at 6 PM and daughter called EMSfor right-sided weakness, facial droop, dysarthria.  Daughter states that he was confused but patient states that he was having a difficult time understanding him which would be more of a receptive aphasia.    By the time EMS arrived (approximately 10 minutes ) all these symptoms had resolved the exception of the right upper extremity weakness--which also quickly resolved.  Patient is noted to have a TIA back in December 2018 (as noted below ) and was placed on Plavix along with his aspirin by his primary care physician.  Patient was not seen in the hospital at that time.Per daughter patient has had one further event prior to this.  At that time they noticed that he had a left facial droop along with leaning to the left.  He did not have any dysarthria at that time.  The symptoms lasted very quickly for approximately 10 minutes. Date last known well: Date: 03/01/2017.Time last known well: Time: 13:00.tPA Given: No: Minimal symptoms.NIH stroke scale at this point is 0.Modified Rankin: Rankin Score=0 MRI scan of the brain showed no acute infarct. LDL cholesterol 50 mg percent. Albumin A1c was 6. Echocardiogram showed no cardiac source of embolism and cardiac  ultrasound was unremarkable. Patient's daughter described that for the past 2 years she had noticed slowing of patient's mentation, speech, ambulation and some swallowing difficulties. He had been evaluated in the past by ENT and by neurologist Dr. Jennings Books at [facility]. He had EMG nerve conduction study which showed sensorimotor neuropathy. Patient has not been started on dementia medications. Is currently living with his wife. He is independent but daughter has noticed cognitive decline recently. Patient ambulatory with a cane but his balance seems to be poor. He does also have sleep apnea but is having trouble using his CPAP mask. The daughter noticed excessive daytime sleepiness and he can fall asleep even in the middle of conversations. He has not seen Dr. Brett Fairy recently for these issues. The patient denies any significant weight loss, muscle fasciculations, muscle wasting or weakness. He is currently on dual antiplatelet therapy aspirin and Plavix daughter get well without bruising or bleeding.  06/21/17 UPDATE: Patient returns today for follow-up visit.  At previous appointment it was recommended to start Aricept but due to multiple side effects from other medications such as dizziness, Aricept was not started.  Daughter states that his memory and concentration are both stable as they have not gotten worse.  Daughter continues to live with patient for assistance with some ADLs and IADLs.  Continues to take aspirin without side effects of bleeding or bruising.  Was recently taken off cholesterol medication due to possible side effects.  Recent EEG was normal and negative for seizure activity.  PT/OT continues  to come to the house.  He is using a wheelchair for long distance but is able to walk with a rolling walker for short distance.  Blood pressure today satisfactory 142/82.  He has been compliant with CPAP machine and follows Dr. Brett Fairy for sleep apnea.  He did recently fractured his ankle on  the left side as he was trying to step out of the truck.  Currently using boot.  Denies new or worsening stroke/TIA symptoms.  11/24/17 visit: Patient is being seen today as requested by daughter as she has continued concerns of neurological episodes. She states approx 1x per week, he will have slurred speech, weak voice, generalized muscle slowing, and worsening in gait. She has checked blood pressure at this time. She denies checking glucose levels during these. Patient feels as though "something is coming and going" but has no specific complaints. Patient denies headache, vision difficulty, worsening dizziness. Recent admission on 09/14/17 due to syncope and bradycardia. Pacemaker was placed. Daughter states SOB and endurance improved but denies any improvement with these episodes. Does not happen at certain time of day, activity or meals. Patient did have episode while inpatient recently for pacemaker implant with facial droop and dysarthria.  Patient found to be hypoglycemic with glucose level 57 along with being hypotensive. Once corrected, symptoms resolved. Denies anxiety or depression. Has been complaint with CPAP for OSA management.  Daughter concerned that these episodes could be an issue that can be corrected as she names specific possibilities such as autoimmune disorder, ALS, guilliane barre, peripheral neuropathy, seizures, recurrent strokes/TIA or worsening nerve impingement due to spinal stenosis. Daughter is also questioning whether he can be started on a medication for neuropathy. When patient questioned regarding neuropathy symptoms, he states he only experiences numbness at time but overall doesn't bother him but then the daughter spoke up and stated "yes you do dad. You have numbness daily". Patient declined any type of nerve pain related to neuropathy.   Advised daughter that an EEG has been obtained multiple times in the past which was negative for seizure activity.  EMG/NCV was also  obtained in the past which did show sensorimotor neuropathy.  Extensive lab work was also obtained during prior admission which all came back satisfactory without concern.  Daughter questioning whether he should be seen by neurosurgeon or spine specialist for evaluation but as patient declines any type of back pain, neuropathy pain or any other type of pain, daughter was advised that he most likely will not be a candidate for any type of surgical procedure or injection as he has no symptoms.  Spoke to daughter regarding possible worsening of dementia but she states memory has not worsened.   Interval history 01/26/2018: Patient is being seen today as requested for sooner appointment by daughter.  Per daughter, patient has been having ongoing gait imbalance issues but over the past week he has been having worsening visual disturbance and worsening bilateral foot numbness which is now with increased numbness up to his knees bilaterally. Patient states that he has been experiencing ongoing balance difficulties without improvement. He continues to participate in physical therapy but has not gained much benefit. Long term goal is returning to aquatic therapy eventually.  Numbness in bilateral feet that comes and goes up to his knees and goes back down. Denies pain and tingling. Explains this as numbness only. He has also been experiencing swelling under his eyes. He states he has noticed increased swelling since starting his CPAP. He also states at  times he will notice a "dimming" in his vision.  He denies any associated headaches, migraines, lightheadedness or any kind of neurological concern during this time.  This has been present for the past few weeks per patient but daughter states this is been going on for the past few months. He has been seen by his ophthalmologist and was told this was due to "dry eyes" and was instructed to use refresh drops which she endorses using 2 times daily without benefit. Patient  has been evaluated in the past by [facility] neurology for these continued complaints.  It was felt as though multifactoral gait disturbance/imbalance due to cervical lumbar disc disease along with peripheral neuropathy.  He is being seen currently by neurosurgery in [place], [place] and plans on undergoing MRI thoracic. He continues on Aricept and denies any worsening of his memory.  Patient does have neurocognitive evaluation scheduled on 02/24/2018. He continues to have intermittent episodes of speech difficulty, generalized slowing of movement and worsening of gait.  She states that this has improved in the past few months or it occurs approximately 1-3 times per week where prior it was occurring daily.  Advised her that after her past appointment in October, it was stated that he was having these episodes occur 1 time weekly but she states this was not accurate.  He denies any migraine or headache during these episodes.  These episodes typically last approximately 5 minutes.  These episodes were discussed at prior appointment and was recommended to check glucose levels at these times to ensure he is not hypoglycemic but daughter states she has not done this.     ROS:   14 system review of systems is positive for blurred vision, shortness of breath, apnea, frequency of urination, urgency, walking difficulty, memory loss, dizziness, numbness, speech difficulty, weakness and facial drooping and all other systems negative  PMH:  Past Medical History:  Diagnosis Date  . AAA (abdominal aortic aneurysm) ([place])   . Anemia   . Ankle fracture    left  . Aortic regurgitation   . Atherosclerosis   . Complication of anesthesia    CONFUSION - Pt's family very concerned about this  . DDD (degenerative disc disease)   . Degenerative arthritis   . Dermatitis, atopic ARMS AND LEGS  . Diverticulosis   . Erectile dysfunction   . Frequency of urination   . Glaucoma   . History of asbestos exposure   .  History of radiation therapy 8/19-13-10/18/11   prostate, 46 GY  . History of shingles 2012-- BILATERAL EYES--  NO RESIDUAL  . Hyperlipidemia   . Hypertension   . Inguinal hernia   . Lumbar scoliosis   . Nocturia   . OA (osteoarthritis) of hip RIGHT  . Prostate cancer ([place]) 05/11/2011   bx=Adenocarcinoma,gleason3+4=7, 4+4=8,PSA=10.30volume=45.7cc  . Renal cyst    bilateral  . Smokers' cough ([place])   . Stroke ([place])   . Thyroid cyst     Social History:  Social History   Socioeconomic History  . Marital status: Married    Spouse name: Alison Murray  . Number of children: 4  . Years of education: College  . Highest education level: Not on file  Occupational History  . Occupation:       Employer: [employer]    Comment: Retired  Scientific laboratory technician  . Financial resource strain: Not on file  . Food insecurity:    Worry: Not on file    Inability: Not on file  .  Transportation needs:    Medical: Not on file    Non-medical: Not on file  Tobacco Use  . Smoking status: Former Smoker    Packs/day: 0.50    Years: 62.00    Pack years: 31.00    Types: Cigarettes  . Smokeless tobacco: Never Used  . Tobacco comment: smoke one or two per day  Substance and Sexual Activity  . Alcohol use: No    Alcohol/week: 1.0 standard drinks    Types: 1 Cans of beer per week  . Drug use: No    Comment: quit smoking 02/2012  . Sexual activity: Not on file  Lifestyle  . Physical activity:    Days per week: Not on file    Minutes per session: Not on file  . Stress: Not on file  Relationships  . Social connections:    Talks on phone: Not on file    Gets together: Not on file    Attends religious service: Not on file    Active member of club or organization: Not on file    Attends meetings of clubs or organizations: Not on file    Relationship status: Not on file  . Intimate partner violence:    Fear of current or ex partner: Not on file    Emotionally abused: Not on file    Physically abused: Not  on file    Forced sexual activity: Not on file  Other Topics Concern  . Not on file  Social History Narrative   Patient is Married Designer, television/film set), and lives at home with his wife 69 years   Patient has 3 daughters and 1 son.   Patient has a college education.   Patient is right-handed.   Patient drinks one cup of coffee, 1-2 cups of tea daily and rarely drinks soda.   Retired from [employer]    Medications:   Current Outpatient Medications on File Prior to Visit  Medication Sig Dispense Refill  . acetaminophen (TYLENOL) 500 MG tablet Take 1,000 mg by mouth 2 (two) times daily.     . Ascorbic Acid (VITAMIN C) 1000 MG tablet Take 1,000 mg by mouth daily.    Marland Kitchen aspirin EC 81 MG tablet Take 81 mg by mouth daily.    . Calcium Carbonate Antacid (TUMS PO) Take 1 tablet by mouth daily.    . cetirizine (ZYRTEC) 10 MG tablet Take 10 mg by mouth daily.    . Cholecalciferol (VITAMIN D-3) 1000 units CAPS Take 1 capsule by mouth 2 (two) times daily.     . clobetasol cream (TEMOVATE) 1.61 % Apply 1 application topically daily at 12 noon.    . Cyanocobalamin (VITAMIN B12 PO) Take 1 tablet by mouth daily. 5000 daily    . Docosahexaenoic Acid (DHA OMEGA 3 PO) Take 1,000 mg by mouth daily.     Marland Kitchen donepezil (ARICEPT) 10 MG tablet Take 10 mg by mouth at bedtime.     . Fluticasone-Umeclidin-Vilant (TRELEGY ELLIPTA) 100-62.5-25 MCG/INH AEPB Inhale 1 puff into the lungs daily. 14 each 0  . folic acid (FOLVITE) 1 MG tablet Take 1 mg by mouth daily.    . irbesartan (AVAPRO) 75 MG tablet Take 75 mg by mouth at bedtime. **REPLACES VALSARTAN**  11  . metoprolol succinate (TOPROL XL) 25 MG 24 hr tablet Take 1 tablet (25 mg total) by mouth daily. 90 tablet 3  . Omega 3-6-9 Fatty Acids (OMEGA DHA PO) Take 1 tablet by mouth at bedtime.     . Probiotic  Product (PROBIOTIC PO) Take 1 capsule by mouth daily.     Marland Kitchen Ubiquinol (QUNOL COQ10/UBIQUINOL/MEGA) 100 MG CAPS Take 1 tablet by mouth at bedtime.     Marland Kitchen umeclidinium-vilanterol  (ANORO ELLIPTA) 62.5-25 MCG/INH AEPB Only open the device one time and then take your two separate drags to be sure you get it all 1 each 11  . UNABLE TO FIND Med Name: CPAP per Dr Beacher May    . valACYclovir (VALTREX) 1000 MG tablet Take 1,000 mg by mouth daily.     No current facility-administered medications on file prior to visit.     Allergies:   Allergies  Allergen Reactions  . Oxycodone Other (See Comments)  . Dilaudid [Hydromorphone Hcl] Other (See Comments)    Confusion   . Methocarbamol Other (See Comments)    Drowsiness   . Percodan [Oxycodone-Aspirin] Other (See Comments)    Confusion   . Tramadol Other (See Comments)    Drowsiness   . Vicodin [Hydrocodone-Acetaminophen] Other (See Comments)    Confusion     Physical Exam General: well developed, pleasant elderly African-American male, well nourished, seated, in no evident distress Head: head normocephalic and atraumatic.  Neck: supple with no carotid or supraclavicular bruits Cardiovascular: regular rate and rhythm, no murmurs Musculoskeletal: no deformity Skin:  no rash/petichiae Vascular:  Normal pulses all extremities Vitals:   01/26/18 1334  BP: (!) 162/102  Pulse: 89   Neurologic Exam Mental Status: Awake and fully alert.  Mild dysarthria.  Remote memory poor Attention span, concentration and fund of knowledge diminished Mood and affect appropriate.  Mini-Mental status exam scored 17/30 (previously 18/30).   MMSE - Mini Mental State Exam 11/24/2017 06/21/2017 03/21/2017  Orientation to time 3 2 4   Orientation to Place 4 4 3   Registration 3 3 3   Attention/ Calculation 1 0 0  Recall 0 0 1  Language- name 2 objects 2 2 2   Language- repeat 0 1 1  Language- follow 3 step command 3 3 3   Language- read & follow direction 1 1 1   Write a sentence 0 1 1  Copy design 0 1 1  Total score 17 18 20     Cranial Nerves: Fundoscopic exam deferred. Pupils equal, briskly reactive to light. Extraocular movements full  without nystagmus. Visual fields full to confrontation. Hearing intact. Facial sensation intact. Face, tongue, palate moves normally and symmetrically. No  tongue atrophy or fasciculations noted. Motor: Normal bulk and tone. Normal strength in all tested extremity muscles. No fasciculations or muscle wasting noted. Sensory.: Diminished touch ,pinprick .position and vibratory sensation from ankle down.  Coordination: No evidence of resting or action tremors.  No evidence of pill-rolling tremors.  No evidence of rigidity.  Mild bradykinesia in bilateral upper extremities. Gait and Station: Patient needs mild assistance to stand.  Hunched stand.  Ambulates with slow cautious steps but satisfactory stride length with assistance of cane.  Swings arms while ambulating equally. Reflexes: 1+ and symmetric. Toes downgoing.     ASSESSMENT: 85 year old African-American gentleman with recent episode of TIA from small vessel disease in February 2019 with suspected underlying mild dementia as well as peripheral neuropathy.  Patient is being seen today for earlier appointment as requested by daughter due to ongoing balance difficulties, intermittent visual disturbance, slowing of movement and worsening neuropathy.     PLAN: -Continue aspirin 81 mg daily for secondary stroke prevention  -Due to continued symptoms: Repeat MRI head wo contrast to ensure no acute abnormality.  Repeat lab work which  was obtained during ED visit on 02/2017 as this has not been done since this time such as some abnormal levels indicating possible lupus.  Advised daughter to ensure she checks glucose levels to ensure he is not becoming hypoglycemic. -Spoke to daughter in regards to his episodes possibly being related to partial seizure like symptoms versus complicated migraine aura.  Will initiate Topamax 25 mg nightly and advised daughter to call office after 2 weeks if he continues to experience symptoms we can consider increasing at  that time. -Visual disturbance: Referral placed to neuro-ophthalmology for further evaluation -Continue therapy sessions for continued balance disturbance/difficulties -Continue to monitor blood pressure at home along with monitoring during episodes -Continue to follow up with PCP regarding HLD and HTN management  -f/u Dr. Brett Fairy for continued OSA and CPAP management -Maintain strict control of hypertension with blood pressure goal below 130/90, diabetes with hemoglobin A1c goal below 6.5% and cholesterol with LDL cholesterol (bad cholesterol) goal below 70 mg/dL. I also advised the patient to eat a healthy diet with plenty of whole grains, cereals, fruits and vegetables, exercise regularly and maintain ideal body weight.  Followup in the future with me in 3 months or call earlier if needed  Greater than 50% of time during this 25 minute visit was spent on counseling,explanation of diagnosis of TIA, reviewing risk factor management of HLD, HTN and memory loss, planning of further management, discussion with patient and family and coordination of care  Venancio Poisson, [facility]  [facility] [address] [place], [place] [address]  Phone [phone] Fax [phone]

## 2018-01-26 NOTE — Telephone Encounter (Signed)
Rn receive call from pts daughter [name] about needing sooner appt. [name] stated her father is having progressive numbness that was in his feet but now its moving to his knees.This has been going on for a week. Also pt has had visual disturbance in the past per but its getting worse too. Robin stated Janett Billow NP is aware of pts past visual disturbance. She stated pt is still having gait, and balance issues. Rn stated pt is still in outpatient therapy for gait and balance. [name] stated the appt is not for sleep issues. RN stated appt on January 31,2020 was cancel because pt is being seen today.Rn stated message will be sent to [facility] NP.The daughter verbalized understanding.

## 2018-01-27 ENCOUNTER — Telehealth: Payer: Self-pay | Admitting: Adult Health

## 2018-01-27 NOTE — Telephone Encounter (Signed)
Pts daughter (on [place]) requested to hold off on scheduling an appt for the neuro ophthalmology. Pt does want to be seen but daughter wants to do a personal search first.

## 2018-01-27 NOTE — Telephone Encounter (Signed)
After appointment yesterday, due to patient complains of intermittent visual dimming it was recommended for him to be evaluated by neuro-ophthalmology which daughter and patient were in agreement to.  It appears as though daughter would like to hold off at this time to do a personal search for, as my understanding, and neuro-ophthalmologist.

## 2018-01-27 NOTE — Telephone Encounter (Signed)
Noted! Thank you

## 2018-01-27 NOTE — Telephone Encounter (Signed)
Patient has a pacemaker faxed info to [place] at [facility] if compatible she will reach out to the patient to schedule.   [employer] medicare Josem Kaufmann: [id number] (exp. 01/27/18 to 04/27/18) [employer] Josem Kaufmann: [place] Ref # [id number].

## 2018-01-28 LAB — CBC
HEMATOCRIT: 37.4 % — AB (ref 37.5–51.0)
Hemoglobin: 12.1 g/dL — ABNORMAL LOW (ref 13.0–17.7)
MCH: 28.1 pg (ref 26.6–33.0)
MCHC: 32.4 g/dL (ref 31.5–35.7)
MCV: 87 fL (ref 79–97)
Platelets: 217 10*3/uL (ref 150–450)
RBC: 4.3 x10E6/uL (ref 4.14–5.80)
RDW: 14.4 % (ref 12.3–15.4)
WBC: 3.8 10*3/uL (ref 3.4–10.8)

## 2018-01-28 LAB — COMPREHENSIVE METABOLIC PANEL
ALBUMIN: 4.1 g/dL (ref 3.5–4.7)
ALT: 34 IU/L (ref 0–44)
AST: 28 IU/L (ref 0–40)
Albumin/Globulin Ratio: 1.2 (ref 1.2–2.2)
Alkaline Phosphatase: 81 IU/L (ref 39–117)
BUN/Creatinine Ratio: 12 (ref 10–24)
BUN: 15 mg/dL (ref 8–27)
Bilirubin Total: <0.2 mg/dL (ref 0.0–1.2)
CO2: 19 mmol/L — ABNORMAL LOW (ref 20–29)
Calcium: 9.9 mg/dL (ref 8.6–10.2)
Chloride: 109 mmol/L — ABNORMAL HIGH (ref 96–106)
Creatinine, Ser: 1.25 mg/dL (ref 0.76–1.27)
GFR calc Af Amer: 61 mL/min/{1.73_m2} (ref >59)
GFR calc non Af Amer: 53 mL/min/{1.73_m2} — ABNORMAL LOW (ref >59)
Globulin, Total: 3.5 g/dL (ref 1.5–4.5)
Glucose: 90 mg/dL (ref 65–99)
Potassium: 4.4 mmol/L (ref 3.5–5.2)
Sodium: 146 mmol/L — ABNORMAL HIGH (ref 134–144)
Total Protein: 7.6 g/dL (ref 6.0–8.5)

## 2018-01-28 LAB — ANA: Anti Nuclear Antibody(ANA): POSITIVE — AB

## 2018-01-28 LAB — ANA,IFA RA DIAG PNL W/RFLX TIT/PATN
ANA Titer 1: NEGATIVE
Cyclic Citrullin Peptide Ab: 8 units (ref 0–19)
Rheumatoid fact SerPl-aCnc: 26.4 IU/mL — ABNORMAL HIGH (ref 0.0–13.9)

## 2018-01-28 LAB — LIPID PANEL
CHOL/HDL RATIO: 4.7 ratio (ref 0.0–5.0)
CHOLESTEROL TOTAL: 185 mg/dL (ref 100–199)
HDL: 39 mg/dL — ABNORMAL LOW (ref >39)
LDL CALC: 125 mg/dL — AB (ref 0–99)
TRIGLYCERIDES: 104 mg/dL (ref 0–149)
VLDL Cholesterol Cal: 21 mg/dL (ref 5–40)

## 2018-01-28 LAB — RPR: RPR: NONREACTIVE

## 2018-01-28 LAB — SEDIMENTATION RATE: Sed Rate: 44 mm/hr — ABNORMAL HIGH (ref 0–30)

## 2018-01-28 LAB — HEMOGLOBIN A1C
Est. average glucose Bld gHb Est-mCnc: 123 mg/dL
Hgb A1c MFr Bld: 5.9 % — ABNORMAL HIGH (ref 4.8–5.6)

## 2018-01-28 LAB — VITAMIN B12: Vitamin B-12: 1610 pg/mL — ABNORMAL HIGH (ref 232–1245)

## 2018-01-28 LAB — TSH: TSH: 1.1 u[IU]/mL (ref 0.450–4.500)

## 2018-01-29 NOTE — Progress Notes (Signed)
I agree with the above plan 

## 2018-01-31 ENCOUNTER — Telehealth: Payer: Self-pay

## 2018-01-31 ENCOUNTER — Other Ambulatory Visit: Payer: Self-pay | Admitting: Adult Health

## 2018-01-31 ENCOUNTER — Ambulatory Visit: Payer: Medicare HMO | Attending: Internal Medicine | Admitting: Physical Therapy

## 2018-01-31 DIAGNOSIS — R41841 Cognitive communication deficit: Secondary | ICD-10-CM | POA: Insufficient documentation

## 2018-01-31 DIAGNOSIS — R42 Dizziness and giddiness: Secondary | ICD-10-CM

## 2018-01-31 DIAGNOSIS — R2681 Unsteadiness on feet: Secondary | ICD-10-CM | POA: Diagnosis not present

## 2018-01-31 DIAGNOSIS — R2689 Other abnormalities of gait and mobility: Secondary | ICD-10-CM

## 2018-01-31 DIAGNOSIS — M6281 Muscle weakness (generalized): Secondary | ICD-10-CM

## 2018-01-31 DIAGNOSIS — R269 Unspecified abnormalities of gait and mobility: Secondary | ICD-10-CM

## 2018-01-31 DIAGNOSIS — R278 Other lack of coordination: Secondary | ICD-10-CM | POA: Diagnosis not present

## 2018-01-31 DIAGNOSIS — M25672 Stiffness of left ankle, not elsewhere classified: Secondary | ICD-10-CM | POA: Diagnosis not present

## 2018-01-31 DIAGNOSIS — R899 Unspecified abnormal finding in specimens from other organs, systems and tissues: Secondary | ICD-10-CM

## 2018-01-31 NOTE — Telephone Encounter (Signed)
Sent via Fax . Telephone [phone] - fax [phone] . Patient's apt 04/14/2018 arrive at 2:00 for 2:30 . Patient family will be made aware and map with instructions will be made. Patient will see Dr. Vilinda Flake. Hassell Done .

## 2018-01-31 NOTE — Telephone Encounter (Signed)
Pts daughter called stating she would like the pt sent to Dr. Hermelinda Medicus (p) [phone]

## 2018-01-31 NOTE — Telephone Encounter (Signed)
-----   Message from Venancio Poisson, NP sent at 01/31/2018  6:55 AM EST ----- Please advise patient and daughter that referral will be placed to rheumatology based on some of his lab findings.  Please advise daughter that no definitive diagnosis is able to be made based on his labs but some condition such as rheumatoid arthritis or lupus can be further ruled out with additional labs by rheumatology.

## 2018-01-31 NOTE — Telephone Encounter (Addendum)
Hinton Dyer, the daughter [name] wants it sent to a MD in [facility] that she prefers. She will call back and ask for you to resend. See phone note today.

## 2018-01-31 NOTE — Telephone Encounter (Signed)
Pts daughter returning RNs call

## 2018-01-31 NOTE — Telephone Encounter (Signed)
Revised. 

## 2018-01-31 NOTE — Telephone Encounter (Signed)
Notes recorded by Marval Regal, RN on 01/31/2018 at 2:27 PM EST Left vm for patients daughter [name] to call back concerning her lab results ,and referral to rheumatology. ------

## 2018-01-31 NOTE — Telephone Encounter (Signed)
Sent referral to [facility] via Fax attached labs .

## 2018-01-31 NOTE — Therapy (Signed)
Derek Carr [address] [place], [place], [address] Phone: [phone]   Fax:  [phone]  Physical Therapy Treatment  Patient Details  Name: Derek Carr MRN: [id number] Date of Birth: 12-19-1931 Referring Provider (PT): Mechele Claude, [place]   Encounter Date: 01/31/2018  PT End of Session - 01/31/18 1621    Visit Number  38    Number of Visits  28    Date for PT Re-Evaluation  04/24/18    Authorization Type  [employer] Medicare & Generic commercial 2nd    Authorization Time Period  Progress Note due visit 83    PT Start Time  1530    PT Stop Time  1615    PT Time Calculation (min)  45 min    Equipment Utilized During Treatment  Gait belt    Activity Tolerance  Patient tolerated treatment well;No increased pain;Patient limited by fatigue    Behavior During Therapy  [facility] for tasks assessed/performed       Past Medical History:  Diagnosis Date  . AAA (abdominal aortic aneurysm) ([place])   . Anemia   . Ankle fracture    left  . Aortic regurgitation   . Atherosclerosis   . Complication of anesthesia    CONFUSION - Pt's family very concerned about this  . DDD (degenerative disc disease)   . Degenerative arthritis   . Dermatitis, atopic ARMS AND LEGS  . Diverticulosis   . Erectile dysfunction   . Frequency of urination   . Glaucoma   . History of asbestos exposure   . History of radiation therapy 8/19-13-10/18/11   prostate, 34 GY  . History of shingles 2012-- BILATERAL EYES--  NO RESIDUAL  . Hyperlipidemia   . Hypertension   . Inguinal hernia   . Lumbar scoliosis   . Nocturia   . OA (osteoarthritis) of hip RIGHT  . Prostate cancer ([place]) 05/11/2011   bx=Adenocarcinoma,gleason3+4=7, 4+4=8,PSA=10.30volume=45.7cc  . Renal cyst    bilateral  . Smokers' cough ([place])   . Stroke ([place])   . Thyroid cyst     Past Surgical History:  Procedure Laterality Date  . ATTEMPTED LEFT VATS/ LEFT THORACOTOMY/ RESECTION OF THE  ENORMOUS, PROBABLE BRONCHOGENIC CYST  01-04-2005  DR Arlyce Dice  . CARDIAC CATHETERIZATION  02-24-2009  DR NASHER   MINOR CORONARY ARTERY IRREGULARITIES/ NORMAL LVSF/ EF 65-70%  . CATARACT EXTRACTION    . COLONOSCOPY W/ POLYPECTOMY    . CYSTOSCOPY  11/15/2011   Procedure: CYSTOSCOPY;  Surgeon: Dutch Gray, MD;  Location: [facility];  Service: Urology;  Laterality: N/A;  no seeds seen in bladder  . esophageus cyst removal  YRS AGO  . [place]  . ORIF ANKLE FRACTURE Left 05/03/2017  . ORIF ANKLE FRACTURE Left 05/03/2017   Procedure: OPEN REDUCTION INTERNAL FIXATION (ORIF) BIMALLEOLAR  ANKLE FRACTURE;  Surgeon: Wylene Simmer, MD;  Location: [place];  Service: Orthopedics;  Laterality: Left;  . PACEMAKER IMPLANT N/A 09/16/2017   Procedure: PACEMAKER IMPLANT;  Surgeon: Constance Haw, MD;  Location: [place] [facility];  Service: Cardiovascular;  Laterality: N/A;  . PROSTATE BIOPSY  05/11/11   Adenocarcinoma (MD OFFICE)  . RADIOACTIVE SEED IMPLANT  11/15/2011   Procedure: RADIOACTIVE SEED IMPLANT;  Surgeon: Dutch Gray, MD;  Location: [facility];  Service: Urology;  Laterality: N/A;  Total number of seeds - 52  . REPAIR RIGHT INGUINAL HERNIA W/ MESH  09-10-1999  . TOTAL HIP ARTHROPLASTY Right 03/20/2013  Procedure: RIGHT TOTAL HIP ARTHROPLASTY ANTERIOR APPROACH;  Surgeon: [name], MD;  [facility];  Service: Orthopedics;  Laterality: Right;  . TOTAL HIP REVISION Right 05/14/2013   Procedure: open reduction internal fixation REVISION RIGHT HIP ;  Surgeon: [name], MD;  [facility];  Service: Orthopedics;  Laterality: Right;  . UMBILICAL HERNIA REPAIR  1998   epigastric    There were no vitals filed for this visit.  Subjective Assessment - 01/31/18 1552    Subjective  pt relays no complaints but he is intrested in water therapy and his daugher relays he had F/U with neurologist who wants PT to perform vestibular examination. He  was moved to be scheduled with our vestibular therapist for this next session.  He relays dizziness is moslty when he gets up in the morning.     Patient is accompained by:  Family member   daugher   Pertinent History  left ankle fracture, TIAs, DDD, Lumbar scoliosis, Glaucoma, cataract surgery, HTN, right THR 03/20/13 with revision 05/14/13, right femur fracture, dementia/Alzheimers    Currently in Pain?  No/denies                       [facility] Adult PT Treatment/Exercise - 01/31/18 0001      Ambulation/Gait   Ambulation/Gait  Yes    Ambulation/Gait Assistance  5: Supervision    Ambulation Distance (Feet)  115 Feet    Assistive device  Straight cane    Gait Pattern  Step-through pattern;Decreased stance time - left;Decreased step length - right;Decreased weight shift to left;Left hip hike;Antalgic;Lateral hip instability;Trunk flexed    Ambulation Surface  Level;Indoor    Stairs  Yes    Stairs Assistance  5: Supervision    Stair Management Technique  Two rails;Step to pattern;Forwards    Number of Stairs  4   x3     High Level Balance   High Level Balance Comments  // bars retrowalking, side stepping, stepping over ojects, walking with head turns up/down X 3 ea, rocker board no UE support in bars 1 min ant-post, 1 min lateral, standing on foam feet together 1 min, feet apart with head turns up/down, lateral 1 min each      Lumbar Exercises: Seated   Other Seated Lumbar Exercises  sit to stand X 10                PT Short Term Goals - 01/24/18 1409      PT SHORT TERM GOAL #1   Title  Pt will be independent with updated HEP exercises (All STGs Target Date 02/24/2018)    Time  1    Period  Months    Status  Revised    Target Date  02/24/18      PT SHORT TERM GOAL #2   Title  Patient ambulates 500' outdoor surfaces including grass with RW with supervision.     Time  1    Period  Months    Status  Revised    Target Date  02/24/18      PT SHORT TERM GOAL #3    Title  Timed Up & Go with cane <15sec with supervision.     Time  1    Period  Months    Status  On-going    Target Date  02/24/18      PT SHORT TERM GOAL #4   Title  Berg Balance >/= [phone]  Time  1    Period  Months    Status  Revised    Target Date  02/24/18      PT SHORT TERM GOAL #5   Title  Patient ambulates 100' around furniture with cane with supervision.     Time  1    Period  Months    Status  New    Target Date  02/24/18        PT Long Term Goals - 01/25/18 1417      PT LONG TERM GOAL #1   Title  Pt will demonstrate independence with ongoing HEP program and fitness plan including returning to aquatic class at [facility] (All LTGs Target Date 04/21/2018)    Time  3    Period  Months    Status  On-going    Target Date  04/21/18      PT LONG TERM GOAL #2   Title  Patient ambulates Modified Independent 500' outdoors including grass, pavement, curbs, ramps with LRAD to improve community ambulatory skills    Time  3    Period  Months    Status  Revised    Target Date  04/21/18      PT LONG TERM GOAL #3   Title  Berg Balance >/= 45/56 to indicate lower fall risk.     Time  3    Period  Months    Status  On-going    Target Date  04/21/18      PT LONG TERM GOAL #4   Title  Timed Up-Go time with cane <13.5 seconds to indicate lower fall risk.     Time  3    Period  Months    Status  On-going    Target Date  04/21/18      PT LONG TERM GOAL #5   Title  Patient ambulates household activities including negotiating furniture, scanning & carrying light items with cane modified independent.     Time  3    Period  Months    Status  New    Target Date  04/21/18            Plan - 01/31/18 1622    Clinical Impression Statement  Session focused on strength, gait and balance with good tolerance but he does require frequent rest breaks. He did appear to have better stability with SPC during gait today. He was moved next session to different PT for  vestibular eval per MD recommendation.     Rehab Potential  Good    Clinical Impairments Affecting Rehab Potential  progressive decline over 2 years, good attitude and family support with 4 children in area.     PT Frequency  2x / week    PT Duration  12 weeks    PT Treatment/Interventions  Gait training;Functional mobility training;Neuromuscular re-education;Balance training;Therapeutic exercise;Therapeutic activities;Patient/family education;ADLs/Self Care Home Management;Stair training;Orthotic Fit/Training;Manual techniques;Vestibular;Canalith Repostioning;Aquatic Therapy    PT Next Visit Plan  vestibular eval, check to see when he can get on aquatic therapy PT schedule,  update HEP, gait & balance activities    PT Home Exercise Plan  [email or web address]     Consulted and Agree with Plan of Care  Patient    Family Member Consulted  daughter       Patient will benefit from skilled therapeutic intervention in order to improve the following deficits and impairments:  Abnormal gait, Decreased activity tolerance, Decreased balance, Decreased endurance, Decreased knowledge of use of DME, Decreased mobility,  Decreased range of motion, Decreased safety awareness, Decreased strength, Impaired flexibility, Postural dysfunction, Dizziness  Visit Diagnosis: Unsteadiness on feet  Muscle weakness (generalized)  Other abnormalities of gait and mobility  Other lack of coordination  Dizziness and giddiness  Stiffness of left ankle, not elsewhere classified     Problem List Patient Active Problem List   Diagnosis Date Noted  . Bradycardia 09/16/2017  . CKD (chronic kidney disease), stage III ([place]) 09/15/2017  . Near syncope 09/14/2017  . Post-operative pain   . Dementia without behavioral disturbance ([place])   . TIA (transient ischemic attack)   . Reactive hypertension   . Bimalleolar fracture of left ankle 05/03/2017  . Axonal neuropathy 03/21/2017  . Right sided weakness 03/01/2017  . Cigarette  smoker 06/16/2016  . Dyspnea on exertion 06/15/2016  . Alzheimer disease ([place]) 01/07/2014  . CSA (central sleep apnea) 10/05/2013  . Syncope 06/26/2013  . COPD GOLD II 06/25/2013  . BPH (benign prostatic hyperplasia) 05/18/2013  . Glaucoma 05/18/2013  . S/P right TH revision 05/14/2013  . Constipation 03/26/2013  . Osteoporosis 03/26/2013  . S/P right THA, AA 03/20/2013  . Hyperlipidemia   . Erectile dysfunction   . History of asbestos exposure   . Nocturia   . History of shingles   . Dermatitis, atopic   . OA (osteoarthritis) of hip   . Arthritis   . Frequency of urination   . Lumbar scoliosis   . Malignant neoplasm of prostate ([place]) 06/07/2011  . 85 year old gentleman with stage T3 adenocarcinoma prostate with Gleason score 4+4 and PSA of 10.3 05/11/2011    Derek Carr 01/31/2018, 4:30 PM  [facility] [address] [place], [place], [address] Phone: [phone]   Fax:  [phone]  Name: Derek Carr MRN: [id number] Date of Birth: [birth year]

## 2018-01-31 NOTE — Telephone Encounter (Addendum)
Rn call patients daughter Derek Carr on dpr. Rn stated that referral will be placed to rheumatology based on some of his lab findings.Rn explain to Derek Carr that no definitive diagnosis is able to be made based on his labs but some condition such as rheumatoid arthritis or lupus can be further ruled out with additional labs by rheumatology.The daughter stated she has a rheumatologist MD in [facility] she would like the referral sent to. The daughter will call back and give the name of the rheumatologist she would like it sent to. Rn ask pt to ask for referrals so they can send it correctly. Derek Carr verbalized understanding.

## 2018-02-01 ENCOUNTER — Encounter: Payer: Self-pay | Admitting: Neurology

## 2018-02-01 NOTE — Telephone Encounter (Signed)
Called Derek Carr and left her message stating referral has been sent telephone [phone] - fax [phone]. DDr. Solon [place] .

## 2018-02-01 NOTE — Telephone Encounter (Signed)
Patient is scheduled at [facility] for 02/02/18.

## 2018-02-02 ENCOUNTER — Ambulatory Visit (HOSPITAL_COMMUNITY)
Admission: RE | Admit: 2018-02-02 | Discharge: 2018-02-02 | Disposition: A | Discharge status: Home / Self Care | Payer: Medicare HMO | Source: Ambulatory Visit | Attending: Orthopedic Surgery | Admitting: Orthopedic Surgery

## 2018-02-02 ENCOUNTER — Ambulatory Visit (HOSPITAL_COMMUNITY): Admission: RE | Admit: 2018-02-02 | Payer: Medicare HMO | Source: Ambulatory Visit

## 2018-02-02 ENCOUNTER — Ambulatory Visit (HOSPITAL_COMMUNITY): Payer: Medicare HMO

## 2018-02-02 ENCOUNTER — Encounter (HOSPITAL_COMMUNITY): Payer: Self-pay

## 2018-02-02 ENCOUNTER — Ambulatory Visit (HOSPITAL_COMMUNITY)
Admission: RE | Admit: 2018-02-02 | Discharge: 2018-02-02 | Disposition: A | Discharge status: Home / Self Care | Payer: Medicare HMO | Source: Ambulatory Visit | Attending: Adult Health | Admitting: Adult Health

## 2018-02-02 DIAGNOSIS — F0390 Unspecified dementia without behavioral disturbance: Secondary | ICD-10-CM | POA: Diagnosis present

## 2018-02-02 DIAGNOSIS — M5136 Other intervertebral disc degeneration, lumbar region: Secondary | ICD-10-CM

## 2018-02-02 DIAGNOSIS — M5126 Other intervertebral disc displacement, lumbar region: Secondary | ICD-10-CM | POA: Diagnosis not present

## 2018-02-02 DIAGNOSIS — M5032 Other cervical disc degeneration, mid-cervical region, unspecified level: Secondary | ICD-10-CM | POA: Insufficient documentation

## 2018-02-02 DIAGNOSIS — R69 Illness, unspecified: Secondary | ICD-10-CM | POA: Diagnosis not present

## 2018-02-02 DIAGNOSIS — I639 Cerebral infarction, unspecified: Secondary | ICD-10-CM | POA: Diagnosis not present

## 2018-02-02 DIAGNOSIS — I10 Essential (primary) hypertension: Secondary | ICD-10-CM | POA: Insufficient documentation

## 2018-02-02 DIAGNOSIS — R262 Difficulty in walking, not elsewhere classified: Secondary | ICD-10-CM

## 2018-02-02 DIAGNOSIS — M4125 Other idiopathic scoliosis, thoracolumbar region: Secondary | ICD-10-CM | POA: Insufficient documentation

## 2018-02-02 DIAGNOSIS — R2990 Unspecified symptoms and signs involving the nervous system: Secondary | ICD-10-CM | POA: Diagnosis present

## 2018-02-02 DIAGNOSIS — M48061 Spinal stenosis, lumbar region without neurogenic claudication: Secondary | ICD-10-CM | POA: Diagnosis not present

## 2018-02-02 DIAGNOSIS — M5124 Other intervertebral disc displacement, thoracic region: Secondary | ICD-10-CM | POA: Diagnosis not present

## 2018-02-02 NOTE — Telephone Encounter (Signed)
Spoke to daughter and she was happy about the referral beginning sent  To Rheumatology . I also talked to her about Neuro OP apt with Dr. Jolyn Nap and she is going to keep that apt.   [phone]-

## 2018-02-03 ENCOUNTER — Ambulatory Visit: Payer: Medicare HMO | Admitting: Physical Therapy

## 2018-02-03 ENCOUNTER — Encounter: Payer: Self-pay | Admitting: Physical Therapy

## 2018-02-03 DIAGNOSIS — R2681 Unsteadiness on feet: Secondary | ICD-10-CM

## 2018-02-03 DIAGNOSIS — M25672 Stiffness of left ankle, not elsewhere classified: Secondary | ICD-10-CM | POA: Diagnosis not present

## 2018-02-03 DIAGNOSIS — R42 Dizziness and giddiness: Secondary | ICD-10-CM | POA: Diagnosis not present

## 2018-02-03 DIAGNOSIS — M6281 Muscle weakness (generalized): Secondary | ICD-10-CM | POA: Diagnosis not present

## 2018-02-03 DIAGNOSIS — R278 Other lack of coordination: Secondary | ICD-10-CM | POA: Diagnosis not present

## 2018-02-03 DIAGNOSIS — R2689 Other abnormalities of gait and mobility: Secondary | ICD-10-CM | POA: Diagnosis not present

## 2018-02-03 DIAGNOSIS — R41841 Cognitive communication deficit: Secondary | ICD-10-CM | POA: Diagnosis not present

## 2018-02-04 NOTE — Therapy (Signed)
[facility] [address] [place], [place], [address] Phone: [phone]   Fax:  [phone]  Physical Therapy Treatment  Patient Details  Name: Derek Carr MRN: [id number] Date of Birth: Aug 20, 1931 Referring Provider (PT): Mechele Claude, [place]   Encounter Date: 02/03/2018  PT End of Session - 02/03/18 0934    Visit Number  39    Number of Visits  70    Date for PT Re-Evaluation  04/24/18    Authorization Type  [employer]etna Medicare & Generic commercial 2nd    Authorization Time Period  Progress Note due visit 12    PT Start Time  0932    PT Stop Time  1017    PT Time Calculation (min)  45 min    Equipment Utilized During Treatment  Gait belt    Activity Tolerance  Patient tolerated treatment well;No increased pain;Patient limited by fatigue    Behavior During Therapy  [facility] for tasks assessed/performed       Past Medical History:  Diagnosis Date  . AAA (abdominal aortic aneurysm) ([place])   . Anemia   . Ankle fracture    left  . Aortic regurgitation   . Atherosclerosis   . Complication of anesthesia    CONFUSION - Pt's family very concerned about this  . DDD (degenerative disc disease)   . Degenerative arthritis   . Dermatitis, atopic ARMS AND LEGS  . Diverticulosis   . Erectile dysfunction   . Frequency of urination   . Glaucoma   . History of asbestos exposure   . History of radiation therapy 8/19-13-10/18/11   prostate, 76 GY  . History of shingles 2012-- BILATERAL EYES--  NO RESIDUAL  . Hyperlipidemia   . Hypertension   . Inguinal hernia   . Lumbar scoliosis   . Nocturia   . OA (osteoarthritis) of hip RIGHT  . Prostate cancer ([place]) 05/11/2011   bx=Adenocarcinoma,gleason3+4=7, 4+4=8,PSA=10.30volume=45.7cc  . Renal cyst    bilateral  . Smokers' cough ([place])   . Stroke ([place])   . Thyroid cyst     Past Surgical History:  Procedure Laterality Date  . ATTEMPTED LEFT VATS/ LEFT THORACOTOMY/ RESECTION OF THE  ENORMOUS, PROBABLE BRONCHOGENIC CYST  01-04-2005  DR Arlyce Dice  . CARDIAC CATHETERIZATION  02-24-2009  DR NASHER   MINOR CORONARY ARTERY IRREGULARITIES/ NORMAL LVSF/ EF 65-70%  . CATARACT EXTRACTION    . COLONOSCOPY W/ POLYPECTOMY    . CYSTOSCOPY  11/15/2011   Procedure: CYSTOSCOPY;  Surgeon: Dutch Gray, MD;  Location: [facility];  Service: Urology;  Laterality: N/A;  no seeds seen in bladder  . esophageus cyst removal  YRS AGO  . [place]  . ORIF ANKLE FRACTURE Left 05/03/2017  . ORIF ANKLE FRACTURE Left 05/03/2017   Procedure: OPEN REDUCTION INTERNAL FIXATION (ORIF) BIMALLEOLAR  ANKLE FRACTURE;  Surgeon: Wylene Simmer, MD;  Location: [facility];  Service: Orthopedics;  Laterality: Left;  . PACEMAKER IMPLANT N/A 09/16/2017   Procedure: PACEMAKER IMPLANT;  Surgeon: Constance Haw, MD;  Location: [place] [facility];  Service: Cardiovascular;  Laterality: N/A;  . PROSTATE BIOPSY  05/11/11   Adenocarcinoma (MD OFFICE)  . RADIOACTIVE SEED IMPLANT  11/15/2011   Procedure: RADIOACTIVE SEED IMPLANT;  Surgeon: Dutch Gray, MD;  Location: [facility];  Service: Urology;  Laterality: N/A;  Total number of seeds - 52  . REPAIR RIGHT INGUINAL HERNIA W/ MESH  09-10-1999  . TOTAL HIP ARTHROPLASTY Right 03/20/2013  Procedure: RIGHT TOTAL HIP ARTHROPLASTY ANTERIOR APPROACH;  Surgeon: [name], MD;  [facility];  Service: Orthopedics;  Laterality: Right;  . TOTAL HIP REVISION Right 05/14/2013   Procedure: open reduction internal fixation REVISION RIGHT HIP ;  Surgeon: [name], MD;  [facility];  Service: Orthopedics;  Laterality: Right;  . UMBILICAL HERNIA REPAIR  1998   epigastric    There were no vitals filed for this visit.  Subjective Assessment - 02/03/18 0934    Subjective  Patient reports when he first stands up each morning his dizziness lasts several minutes. Daughter reports it's throughout the day since 2015. He's had vestibular  rehab several times (last time in [place], the ENT has diagnosed vestibulopathy with rt worse than left). In 2019 he has backslid and dizziness and balance are worse. He has continued to do some of the exercises (that he can remember)--he's had a lot of therapy over the past years and has lots of exercises.     Patient is accompained by:  Family member   daugher   Pertinent History  left ankle fracture, TIAs, DDD, Lumbar scoliosis, Glaucoma, cataract surgery, HTN, right THR 03/20/13 with revision 05/14/13, right femur fracture, dementia/Alzheimers    Limitations  Standing;Walking;House hold activities    Patient Stated Goals  Patient wants to improve his balance & walking    Currently in Pain?  No/denies             Vestibular Assessment - 02/03/18 2031      Symptom Behavior   Type of Dizziness  Lightheadedness    Frequency of Dizziness  daily    Duration of Dizziness  unknown    Aggravating Factors  Sit to stand;Supine to sit    Relieving Factors  No known relieving factors      Occulomotor Exam   Occulomotor Alignment  Normal    Spontaneous  Absent    Gaze-induced  Absent    Smooth Pursuits  Intact   daughter held his head still as pt had difficulty just moving his eyes     Vestibulo-Occular Reflex   VOR 1 Head Only (x 1 viewing)  seated with target 2 feet ahead; slow head turns pt maintains his eyes on target    VOR to Slow Head Movement  Normal    Comment  HIT negative bil      Visual Acuity   Static  8    Dynamic  6                    Balance Exercises - 02/03/18 2036      Balance Exercises: Standing   Standing Eyes Opened  Narrow base of support (BOS);Wide (BOA);Head turns;Foam/compliant surface;Solid surface    Standing Eyes Closed  Narrow base of support (BOS);Wide (BOA);Foam/compliant surface;Solid surface        PT Education - 02/03/18 2038    Education provided  Yes    Education Details  currently no sign of hypofunction (if that is what  ENT meant by "vestibulopathy") can benefit from vestibular rehab/ balance training due to impaired LE sensation and vision    Person(s) Educated  Patient;Child(ren)    Methods  Explanation    Comprehension  Verbalized understanding       PT Short Term Goals - 01/24/18 1409      PT SHORT TERM GOAL #1   Title  Pt will be independent with updated HEP exercises (All STGs Target Date 02/24/2018)  Time  1    Period  Months    Status  Revised    Target Date  02/24/18      PT SHORT TERM GOAL #2   Title  Patient ambulates 500' outdoor surfaces including grass with RW with supervision.     Time  1    Period  Months    Status  Revised    Target Date  02/24/18      PT SHORT TERM GOAL #3   Title  Timed Up & Go with cane <15sec with supervision.     Time  1    Period  Months    Status  On-going    Target Date  02/24/18      PT SHORT TERM GOAL #4   Title  Berg Balance >/= 39/56    Time  1    Period  Months    Status  Revised    Target Date  02/24/18      PT SHORT TERM GOAL #5   Title  Patient ambulates 100' around furniture with cane with supervision.     Time  1    Period  Months    Status  New    Target Date  02/24/18        PT Long Term Goals - 01/25/18 1417      PT LONG TERM GOAL #1   Title  Pt will demonstrate independence with ongoing HEP program and fitness plan including returning to aquatic class at [facility] (All LTGs Target Date 04/21/2018)    Time  3    Period  Months    Status  On-going    Target Date  04/21/18      PT LONG TERM GOAL #2   Title  Patient ambulates Modified Independent 500' outdoors including grass, pavement, curbs, ramps with LRAD to improve community ambulatory skills    Time  3    Period  Months    Status  Revised    Target Date  04/21/18      PT LONG TERM GOAL #3   Title  Berg Balance >/= 45/56 to indicate lower fall risk.     Time  3    Period  Months    Status  On-going    Target Date  04/21/18      PT LONG TERM GOAL #4    Title  Timed Up-Go time with cane <13.5 seconds to indicate lower fall risk.     Time  3    Period  Months    Status  On-going    Target Date  04/21/18      PT LONG TERM GOAL #5   Title  Patient ambulates household activities including negotiating furniture, scanning & carrying light items with cane modified independent.     Time  3    Period  Months    Status  New    Target Date  04/21/18            Plan - 02/03/18 2043    Clinical Impression Statement  Session focused on vestibular re-assessment and vestib/balance training. Patient did not show signs of vestibular hypofunction. In additon, he was able to stand on foam with feet close together and eyes closed for up to 20 seconds with min-mod sway (but pt recovering without external support). On foam with EO and head turns pt did well maintaining (or regaining) his balance for up to 30 seconds. Educated pt and daughter on improvements  noted compared to prior testing and do not feel he needs to return to see ENT. Agreed to continue to focus on strengthening his vestibular system to help compensate for his decr sensation in legs/feet and decreased vision. Daughter mentions pt has been scheduled to see a neuro-opthalmologist at [facility] and his appt is in March 2020.     Rehab Potential  Good    Clinical Impairments Affecting Rehab Potential  progressive decline over 2 years, good attitude and family support with 4 children in area.     PT Frequency  2x / week    PT Duration  12 weeks    PT Treatment/Interventions  Gait training;Functional mobility training;Neuromuscular re-education;Balance training;Therapeutic exercise;Therapeutic activities;Patient/family education;ADLs/Self Care Home Management;Stair training;Orthotic Fit/Training;Manual techniques;Vestibular;Canalith Repostioning;Aquatic Therapy    PT Next Visit Plan 10th visit PN due;  check to see when he can get on aquatic therapy PT schedule,  update HEP (esp  corner exercises and ?recommend he has someone with him when he does these at home), gait & balance activities (especially incorporating compliant surfaces and EC     PT Home Exercise Plan  [email or web address]     Consulted and Agree with Plan of Care  Patient    Family Member Consulted  daughter       Patient will benefit from skilled therapeutic intervention in order to improve the following deficits and impairments:  Abnormal gait, Decreased activity tolerance, Decreased balance, Decreased endurance, Decreased knowledge of use of DME, Decreased mobility, Decreased range of motion, Decreased safety awareness, Decreased strength, Impaired flexibility, Postural dysfunction, Dizziness  Visit Diagnosis: Unsteadiness on feet     Problem List Patient Active Problem List   Diagnosis Date Noted  . Bradycardia 09/16/2017  . CKD (chronic kidney disease), stage III ([place]) 09/15/2017  . Near syncope 09/14/2017  . Post-operative pain   . Dementia without behavioral disturbance ([place])   . TIA (transient ischemic attack)   . Reactive hypertension   . Bimalleolar fracture of left ankle 05/03/2017  . Axonal neuropathy 03/21/2017  . Right sided weakness 03/01/2017  . Cigarette smoker 06/16/2016  . Dyspnea on exertion 06/15/2016  . Alzheimer disease ([place]) 01/07/2014  . CSA (central sleep apnea) 10/05/2013  . Syncope 06/26/2013  . COPD GOLD II 06/25/2013  . BPH (benign prostatic hyperplasia) 05/18/2013  . Glaucoma 05/18/2013  . S/P right TH revision 05/14/2013  . Constipation 03/26/2013  . Osteoporosis 03/26/2013  . S/P right THA, AA 03/20/2013  . Hyperlipidemia   . Erectile dysfunction   . History of asbestos exposure   . Nocturia   . History of shingles   . Dermatitis, atopic   . OA (osteoarthritis) of hip   . Arthritis   . Frequency of urination   . Lumbar scoliosis   . Malignant neoplasm of prostate ([place]) 06/07/2011  . 85 year old gentleman with stage T3 adenocarcinoma prostate with Gleason  score 4+4 and PSA of 10.3 05/11/2011    Rexanne Mano, PT 02/04/2018, 6:30 AM  [facility] [address] [place], [place], [address] Phone: [phone]   Fax:  [phone]  Name: Derek Carr MRN: [id number] Date of Birth: Feb 01, 1931

## 2018-02-07 ENCOUNTER — Encounter: Payer: Self-pay | Admitting: Physical Therapy

## 2018-02-07 ENCOUNTER — Ambulatory Visit: Payer: Medicare HMO | Admitting: Physical Therapy

## 2018-02-07 DIAGNOSIS — M6281 Muscle weakness (generalized): Secondary | ICD-10-CM

## 2018-02-07 DIAGNOSIS — R41841 Cognitive communication deficit: Secondary | ICD-10-CM | POA: Diagnosis not present

## 2018-02-07 DIAGNOSIS — R2681 Unsteadiness on feet: Secondary | ICD-10-CM | POA: Diagnosis not present

## 2018-02-07 DIAGNOSIS — R2689 Other abnormalities of gait and mobility: Secondary | ICD-10-CM

## 2018-02-07 DIAGNOSIS — R42 Dizziness and giddiness: Secondary | ICD-10-CM

## 2018-02-07 DIAGNOSIS — R278 Other lack of coordination: Secondary | ICD-10-CM | POA: Diagnosis not present

## 2018-02-07 DIAGNOSIS — M25672 Stiffness of left ankle, not elsewhere classified: Secondary | ICD-10-CM | POA: Diagnosis not present

## 2018-02-07 NOTE — Patient Instructions (Signed)
Access Code: [facility]  URL: [email or web address]  Date: 02/07/2018  Prepared by: Willow Ora   Exercises  Romberg Stance Eyes Closed on Foam Pad - 3 reps - 1 sets - 30 hold - 1x daily - 5x weekly  Wide Stance with Head Rotation on Foam Pad - 10 reps - 1 sets - 1x daily - 5x weekly  Wide Stance with Head Nods on Foam Pad - 10 reps - 1 sets - 1x daily - 5x weekly

## 2018-02-07 NOTE — Therapy (Signed)
[facility] [address] [place], [place], [address] Phone: [phone]   Fax:  [phone]  Physical Therapy Treatment  Patient Details  Name: Derek Carr MRN: [id number] Date of Birth: December 21, 1931 Referring Provider (PT): Mechele Claude, [place]   Encounter Date: 02/07/2018  PT End of Session - 02/07/18 1022    Visit Number  40    Number of Visits  26    Date for PT Re-Evaluation  04/24/18    Authorization Type  A[employer] Medicare & Generic commercial 2nd    Authorization Time Period  Progress Note due visit 85    PT Start Time  1018    PT Stop Time  1102    PT Time Calculation (min)  44 min    Equipment Utilized During Treatment  Gait belt    Activity Tolerance  Patient tolerated treatment well;No increased pain    Behavior During Therapy  WFL for tasks assessed/performed       Past Medical History:  Diagnosis Date  . AAA (abdominal aortic aneurysm) ([place])   . Anemia   . Ankle fracture    left  . Aortic regurgitation   . Atherosclerosis   . Complication of anesthesia    CONFUSION - Pt's family very concerned about this  . DDD (degenerative disc disease)   . Degenerative arthritis   . Dermatitis, atopic ARMS AND LEGS  . Diverticulosis   . Erectile dysfunction   . Frequency of urination   . Glaucoma   . History of asbestos exposure   . History of radiation therapy 8/19-13-10/18/11   prostate, 57 GY  . History of shingles 2012-- BILATERAL EYES--  NO RESIDUAL  . Hyperlipidemia   . Hypertension   . Inguinal hernia   . Lumbar scoliosis   . Nocturia   . OA (osteoarthritis) of hip RIGHT  . Prostate cancer ([place]) 05/11/2011   bx=Adenocarcinoma,gleason3+4=7, 4+4=8,PSA=10.30volume=45.7cc  . Renal cyst    bilateral  . Smokers' cough ([place])   . Stroke ([place])   . Thyroid cyst     Past Surgical History:  Procedure Laterality Date  . ATTEMPTED LEFT VATS/ LEFT THORACOTOMY/ RESECTION OF THE ENORMOUS, PROBABLE  BRONCHOGENIC CYST  01-04-2005  DR Arlyce Dice  . CARDIAC CATHETERIZATION  02-24-2009  DR NASHER   MINOR CORONARY ARTERY IRREGULARITIES/ NORMAL LVSF/ EF 65-70%  . CATARACT EXTRACTION    . COLONOSCOPY W/ POLYPECTOMY    . CYSTOSCOPY  11/15/2011   Procedure: CYSTOSCOPY;  Surgeon: Dutch Gray, MD;  Location[facility];  Service: Urology;  Laterality: N/A;  no seeds seen in bladder  . esophageus cyst removal  YRS AGO  . [place]  . ORIF ANKLE FRACTURE Left 05/03/2017  . ORIF ANKLE FRACTURE Left 05/03/2017   Procedure: OPEN REDUCTION INTERNAL FIXATION (ORIF) BIMALLEOLAR  ANKLE FRACTURE;  Surgeon: Wylene Simmer, MD;  Location: [facility];  Service: Orthopedics;  Laterality: Left;  . PACEMAKER IMPLANT N/A 09/16/2017   Procedure: PACEMAKER IMPLANT;  Surgeon: Constance Haw, MD;  Location: [facility];  Service: Cardiovascular;  Laterality: N/A;  . PROSTATE BIOPSY  05/11/11   Adenocarcinoma (MD OFFICE)  . RADIOACTIVE SEED IMPLANT  11/15/2011   Procedure: RADIOACTIVE SEED IMPLANT;  Surgeon: Dutch Gray, MD;  Location: [facility];  Service: Urology;  Laterality: N/A;  Total number of seeds - 52  . REPAIR RIGHT INGUINAL HERNIA W/ MESH  09-10-1999  . TOTAL HIP ARTHROPLASTY Right 03/20/2013   Procedure:  RIGHT TOTAL HIP ARTHROPLASTY ANTERIOR APPROACH;  Surgeon: [name], MD;  [facility];  Service: Orthopedics;  Laterality: Right;  . TOTAL HIP REVISION Right 05/14/2013   Procedure: open reduction internal fixation REVISION RIGHT HIP ;  Surgeon: [name], MD;  [facility];  Service: Orthopedics;  Laterality: Right;  . UMBILICAL HERNIA REPAIR  1998   epigastric    There were no vitals filed for this visit.  Subjective Assessment - 02/07/18 1021    Subjective  Pt using cane on arrival to therapy today with no family member with him. (daughter parking car).  Denies falls or pain. Reports dizziness is "good" today, "it comes and goes".      Pertinent History  left ankle fracture, TIAs, DDD, Lumbar scoliosis, Glaucoma, cataract surgery, HTN, right THR 03/20/13 with revision 05/14/13, right femur fracture, dementia/Alzheimers    Limitations  Standing;Walking;House hold activities    Patient Stated Goals  Patient wants to improve his balance & walking    Currently in Pain?  No/denies    Pain Score  0-No pain             [facility] Adult PT Treatment/Exercise - 02/07/18 1058      Transfers   Transfers  Sit to Stand;Stand to Sit    Sit to Stand  5: Supervision;With upper extremity assist;From bed;From chair/3-in-1    Stand to Sit  5: Supervision;With upper extremity assist;With armrests;To chair/3-in-1;To bed      Ambulation/Gait   Ambulation/Gait  Yes    Ambulation/Gait Assistance  5: Supervision;4: Min guard    Ambulation/Gait Assistance Details  supervision to min guard assist for safety due to fwd flexed posture with use of cane for gait.     Ambulation Distance (Feet)  100 Feet   x2   Assistive device  Straight cane   with rubber quad tip   Gait Pattern  Step-through pattern;Decreased stance time - left;Decreased step length - right;Decreased weight shift to left;Left hip hike;Antalgic;Lateral hip instability;Trunk flexed    Ambulation Surface  Level;Indoor      Neuro Re-ed    Neuro Re-ed Details   for balance/NMR: revised pt's corner balance HEP. Refer to [facility] program for full details.           Balance Exercises - 02/07/18 1057      Balance Exercises: Standing   Standing Eyes Closed  Wide (BOA);Foam/compliant surface;2 reps;30 secs;Limitations    Balance Beam  standing across blue foam beam with no UE support: alternating fwd stepping to floor/back onto beam, then alternating bwd stepping to floor/back onto beam. cues for increased step length, step height and to maintain upright posture. min guard to min assist for balance.        Balance Exercises: Standing   Standing Eyes Closed Limitations  standing  across blue foam beam with feet hip width apart, no UE support: EC no head movements, cues on posture and weight shifting to assist with balance.         Issued to HEP today. Min guard to min assist for balance with max cues for upright posture. Access Code: [facility]  URL: [email or web address]  Date: 02/07/2018  Prepared by: Willow Ora   Exercises  Romberg Stance Eyes Closed on Foam Pad - 3 reps - 1 sets - 30 hold - 1x daily - 5x weekly  Wide Stance with Head Rotation on Foam Pad - 10 reps - 1 sets - 1x daily - 5x weekly  Wide Stance with  Head Nods on Foam Pad - 10 reps - 1 sets - 1x daily - 5x weekly  PT Education - 02/07/18 1202    Education provided  Yes    Education Details  updated corner balance HEP    Person(s) Educated  Patient    Methods  Explanation;Demonstration;Verbal cues;Handout    Comprehension  Verbalized understanding;Returned demonstration;Verbal cues required;Tactile cues required;Need further instruction       PT Short Term Goals - 01/24/18 1409      PT SHORT TERM GOAL #1   Title  Pt will be independent with updated HEP exercises (All STGs Target Date 02/24/2018)    Time  1    Period  Months    Status  Revised    Target Date  02/24/18      PT SHORT TERM GOAL #2   Title  Patient ambulates 500' outdoor surfaces including grass with RW with supervision.     Time  1    Period  Months    Status  Revised    Target Date  02/24/18      PT SHORT TERM GOAL #3   Title  Timed Up & Go with cane <15sec with supervision.     Time  1    Period  Months    Status  On-going    Target Date  02/24/18      PT SHORT TERM GOAL #4   Title  Berg Balance >/= 39/56    Time  1    Period  Months    Status  Revised    Target Date  02/24/18      PT SHORT TERM GOAL #5   Title  Patient ambulates 100' around furniture with cane with supervision.     Time  1    Period  Months    Status  New    Target Date  02/24/18        PT Long Term Goals - 01/25/18 1417       PT LONG TERM GOAL #1   Title  Pt will demonstrate independence with ongoing HEP program and fitness plan including returning to aquatic class at [facility] (All LTGs Target Date 04/21/2018)    Time  3    Period  Months    Status  On-going    Target Date  04/21/18      PT LONG TERM GOAL #2   Title  Patient ambulates Modified Independent 500' outdoors including grass, pavement, curbs, ramps with LRAD to improve community ambulatory skills    Time  3    Period  Months    Status  Revised    Target Date  04/21/18      PT LONG TERM GOAL #3   Title  Berg Balance >/= 45/56 to indicate lower fall risk.     Time  3    Period  Months    Status  On-going    Target Date  04/21/18      PT LONG TERM GOAL #4   Title  Timed Up-Go time with cane <13.5 seconds to indicate lower fall risk.     Time  3    Period  Months    Status  On-going    Target Date  04/21/18      PT LONG TERM GOAL #5   Title  Patient ambulates household activities including negotiating furniture, scanning & carrying light items with cane modified independent.     Time  3  Period  Months    Status  New    Target Date  04/21/18            Plan - 02/07/18 1023    Clinical Impression Statement  Today's skilled session focused on revising pt's corner balance HEP to increase vestibular system imput. Remainder of session focused on balance reactions and gait. Did advise pt that he is still at risk for falls and should not be using the cane alone at this time. Was not able to tell his daughter as she did not attend session today, will do so at next session she attends. The pt is progressing toward goals and should benefit from continued PT to progress toward unmet goals.     Rehab Potential  Good    Clinical Impairments Affecting Rehab Potential  progressive decline over 2 years, good attitude and family support with 4 children in area.     PT Frequency  2x / week    PT Duration  12 weeks    PT  Treatment/Interventions  Gait training;Functional mobility training;Neuromuscular re-education;Balance training;Therapeutic exercise;Therapeutic activities;Patient/family education;ADLs/Self Care Home Management;Stair training;Orthotic Fit/Training;Manual techniques;Vestibular;Canalith Repostioning;Aquatic Therapy    PT Next Visit Plan  provide pt with his aquatic PT schedule, gait & balance activities (especially incorporating compliant surfaces and EC     PT Home Exercise Plan  [facility]    Consulted and Agree with Plan of Care  Patient    Family Member Consulted  daughter       Patient will benefit from skilled therapeutic intervention in order to improve the following deficits and impairments:  Abnormal gait, Decreased activity tolerance, Decreased balance, Decreased endurance, Decreased knowledge of use of DME, Decreased mobility, Decreased range of motion, Decreased safety awareness, Decreased strength, Impaired flexibility, Postural dysfunction, Dizziness  Visit Diagnosis: Unsteadiness on feet  Muscle weakness (generalized)  Other abnormalities of gait and mobility  Dizziness and giddiness     Problem List Patient Active Problem List   Diagnosis Date Noted  . Bradycardia 09/16/2017  . CKD (chronic kidney disease), stage III ([place]) 09/15/2017  . Near syncope 09/14/2017  . Post-operative pain   . Dementia without behavioral disturbance ([place])   . TIA (transient ischemic attack)   . Reactive hypertension   . Bimalleolar fracture of left ankle 05/03/2017  . Axonal neuropathy 03/21/2017  . Right sided weakness 03/01/2017  . Cigarette smoker 06/16/2016  . Dyspnea on exertion 06/15/2016  . Alzheimer disease ([place]) 01/07/2014  . CSA (central sleep apnea) 10/05/2013  . Syncope 06/26/2013  . COPD GOLD II 06/25/2013  . BPH (benign prostatic hyperplasia) 05/18/2013  . Glaucoma 05/18/2013  . S/P right TH revision 05/14/2013  . Constipation 03/26/2013  . Osteoporosis 03/26/2013  .  S/P right THA, AA 03/20/2013  . Hyperlipidemia   . Erectile dysfunction   . History of asbestos exposure   . Nocturia   . History of shingles   . Dermatitis, atopic   . OA (osteoarthritis) of hip   . Arthritis   . Frequency of urination   . Lumbar scoliosis   . Malignant neoplasm of prostate ([place]) 06/07/2011  . 85 year old gentleman with stage T3 adenocarcinoma prostate with Gleason score 4+4 and PSA of 10.3 05/11/2011    Willow Ora, PTA, [facility] [address], [place], [place] [address] [phone] 02/07/18, 12:04 PM   Name: Derek Carr MRN: [id number] Date of Birth: 1931-07-11

## 2018-02-08 DIAGNOSIS — G8918 Other acute postprocedural pain: Secondary | ICD-10-CM | POA: Diagnosis not present

## 2018-02-08 DIAGNOSIS — R531 Weakness: Secondary | ICD-10-CM | POA: Diagnosis not present

## 2018-02-08 DIAGNOSIS — Z471 Aftercare following joint replacement surgery: Secondary | ICD-10-CM | POA: Diagnosis not present

## 2018-02-08 DIAGNOSIS — R0602 Shortness of breath: Secondary | ICD-10-CM | POA: Diagnosis not present

## 2018-02-08 DIAGNOSIS — G4731 Primary central sleep apnea: Secondary | ICD-10-CM | POA: Diagnosis not present

## 2018-02-08 DIAGNOSIS — R351 Nocturia: Secondary | ICD-10-CM | POA: Diagnosis not present

## 2018-02-08 DIAGNOSIS — S82842A Displaced bimalleolar fracture of left lower leg, initial encounter for closed fracture: Secondary | ICD-10-CM | POA: Diagnosis not present

## 2018-02-09 ENCOUNTER — Telehealth: Payer: Self-pay | Admitting: Adult Health

## 2018-02-09 NOTE — Telephone Encounter (Signed)
Anderson Malta from [facility] is needing office notes and a copy of the pts ins information. Please advise.

## 2018-02-09 NOTE — Telephone Encounter (Signed)
Sent Anderson Malta a message TO CX referral . Patient is going to  [facility]   Referral sent --  [phone] - fax [phone]

## 2018-02-10 ENCOUNTER — Ambulatory Visit: Payer: Medicare HMO | Admitting: Physical Therapy

## 2018-02-10 ENCOUNTER — Encounter: Payer: Self-pay | Admitting: Physical Therapy

## 2018-02-10 DIAGNOSIS — M25672 Stiffness of left ankle, not elsewhere classified: Secondary | ICD-10-CM | POA: Diagnosis not present

## 2018-02-10 DIAGNOSIS — R2689 Other abnormalities of gait and mobility: Secondary | ICD-10-CM | POA: Diagnosis not present

## 2018-02-10 DIAGNOSIS — R2681 Unsteadiness on feet: Secondary | ICD-10-CM

## 2018-02-10 DIAGNOSIS — R42 Dizziness and giddiness: Secondary | ICD-10-CM | POA: Diagnosis not present

## 2018-02-10 DIAGNOSIS — R278 Other lack of coordination: Secondary | ICD-10-CM | POA: Diagnosis not present

## 2018-02-10 DIAGNOSIS — R41841 Cognitive communication deficit: Secondary | ICD-10-CM | POA: Diagnosis not present

## 2018-02-10 DIAGNOSIS — M6281 Muscle weakness (generalized): Secondary | ICD-10-CM | POA: Diagnosis not present

## 2018-02-12 NOTE — Therapy (Signed)
[facility] [address] [place], [place], [address] Phone: [phone]   Fax:  [phone]  Physical Therapy Treatment  Patient Details  Name: Derek Carr MRN: [id number] Date of Birth: 03/31/1931 Referring Provider (PT): Mechele Claude, [place]   Encounter Date: 02/10/2018    02/10/18 1409  PT Visits / Re-Eval  Visit Number 41  Number of Visits 54  Date for PT Re-Evaluation 04/24/18  Authorization  Authorization Type Aetna Medicare & Generic commercial 2nd  Authorization Time Period Progress Note due visit 72  PT Time Calculation  PT Start Time 1404  PT Stop Time 1445  PT Time Calculation (min) 41 min  PT - End of Session  Equipment Utilized During Treatment Gait belt  Activity Tolerance Patient tolerated treatment well;No increased pain  Behavior During Therapy WFL for tasks assessed/performed     Past Medical History:  Diagnosis Date  . AAA (abdominal aortic aneurysm) ([place])   . Anemia   . Ankle fracture    left  . Aortic regurgitation   . Atherosclerosis   . Complication of anesthesia    CONFUSION - Pt's family very concerned about this  . DDD (degenerative disc disease)   . Degenerative arthritis   . Dermatitis, atopic ARMS AND LEGS  . Diverticulosis   . Erectile dysfunction   . Frequency of urination   . Glaucoma   . History of asbestos exposure   . History of radiation therapy 8/19-13-10/18/11   prostate, 30 GY  . History of shingles 2012-- BILATERAL EYES--  NO RESIDUAL  . Hyperlipidemia   . Hypertension   . Inguinal hernia   . Lumbar scoliosis   . Nocturia   . OA (osteoarthritis) of hip RIGHT  . Prostate cancer ([place]) 05/11/2011   bx=Adenocarcinoma,gleason3+4=7, 4+4=8,PSA=10.30volume=45.7cc  . Renal cyst    bilateral  . Smokers' cough ([place])   . Stroke ([place])   . Thyroid cyst     Past Surgical History:  Procedure Laterality Date  . ATTEMPTED LEFT VATS/ LEFT THORACOTOMY/ RESECTION OF  THE ENORMOUS, PROBABLE BRONCHOGENIC CYST  01-04-2005  DR Arlyce Dice  . CARDIAC CATHETERIZATION  02-24-2009  DR NASHER   MINOR CORONARY ARTERY IRREGULARITIES/ NORMAL LVSF/ EF 65-70%  . CATARACT EXTRACTION    . COLONOSCOPY W/ POLYPECTOMY    . CYSTOSCOPY  11/15/2011   Procedure: CYSTOSCOPY;  Surgeon: Dutch Gray, MD;  Location: [facility];  Service: Urology;  Laterality: N/A;  no seeds seen in bladder  . esophageus cyst removal  YRS AGO  . [place]  . ORIF ANKLE FRACTURE Left 05/03/2017  . ORIF ANKLE FRACTURE Left 05/03/2017   Procedure: OPEN REDUCTION INTERNAL FIXATION (ORIF) BIMALLEOLAR  ANKLE FRACTURE;  Surgeon: Wylene Simmer, MD;  Location: [facility];  Service: Orthopedics;  Laterality: Left;  . PACEMAKER IMPLANT N/A 09/16/2017   Procedure: PACEMAKER IMPLANT;  Surgeon: Constance Haw, MD;  Location: [facility];  Service: Cardiovascular;  Laterality: N/A;  . PROSTATE BIOPSY  05/11/11   Adenocarcinoma ([facility] OFFICE)  . RADIOACTIVE SEED IMPLANT  11/15/2011   Procedure: RADIOACTIVE SEED IMPLANT;  Surgeon: Dutch Gray, MD;  Location: [facility];  Service: Urology;  Laterality: N/A;  Total number of seeds - 52  . REPAIR RIGHT INGUINAL HERNIA W/ MESH  09-10-1999  . TOTAL HIP ARTHROPLASTY Right 03/20/2013   Procedure: RIGHT TOTAL HIP ARTHROPLASTY ANTERIOR APPROACH;  Surgeon: [name], MD;  [facility];  Service: Orthopedics;  Laterality:  Right;  Marland Kitchen TOTAL HIP REVISION Right 05/14/2013   Procedure: open reduction internal fixation REVISION RIGHT HIP ;  Surgeon: [name], MD;  [facility];  Service: Orthopedics;  Laterality: Right;  . UMBILICAL HERNIA REPAIR  1998   epigastric    There were no vitals filed for this visit.    02/10/18 1957  Symptoms/Limitations  Subjective Pt back to using RW for gait. No new complaints. No falls or pain to report.   Patient is accompained by: Family member (daughter and spouse)  Limitations  Standing;Walking;House hold activities  Patient Stated Goals Patient wants to improve his balance & walking  Pain Assessment  Currently in Pain? No/denies  Pain Score 0       02/10/18 1415  Lumbar Exercises: Seated  Other Seated Lumbar Exercises seated on green air disc: with 2# weight dowel rod- UE raises x 10 reps, then holding dowel rod at shoulder height uppert trunk rotation left<>right x 10 reps each way; with red band- rows with 5 sec holds, shoulder extension x 10 reps, shoulder horizontal abduction x 10 reps, then alternating diagonal pulls x 10 each way. cues for abd bracing and ex form.   Knee/Hip Exercises: Stretches  Hip Flexor Stretch Both;2 reps;60 seconds;Limitations  Hip Flexor Stretch Limitations supine at edge of mat with leg off edge and foot on ground.   Knee/Hip Exercises: Supine  Bridges AROM;Strengthening;Both;10 reps;Limitations;2 sets  Bridges Limitations 5 sec holds each rep with cues for maximal hip elevation.          PT Short Term Goals - 01/24/18 1409      PT SHORT TERM GOAL #1   Title  Pt will be independent with updated HEP exercises (All STGs Target Date 02/24/2018)    Time  1    Period  Months    Status  Revised    Target Date  02/24/18      PT SHORT TERM GOAL #2   Title  Patient ambulates 500' outdoor surfaces including grass with RW with supervision.     Time  1    Period  Months    Status  Revised    Target Date  02/24/18      PT SHORT TERM GOAL #3   Title  Timed Up & Go with cane <15sec with supervision.     Time  1    Period  Months    Status  On-going    Target Date  02/24/18      PT SHORT TERM GOAL #4   Title  Berg Balance >/= 39/56    Time  1    Period  Months    Status  Revised    Target Date  02/24/18      PT SHORT TERM GOAL #5   Title  Patient ambulates 100' around furniture with cane with supervision.     Time  1    Period  Months    Status  New    Target Date  02/24/18        PT Long Term Goals - 01/25/18  1417      PT LONG TERM GOAL #1   Title  Pt will demonstrate independence with ongoing HEP program and fitness plan including returning to aquatic class at [facility] (All LTGs Target Date 04/21/2018)    Time  3    Period  Months    Status  On-going    Target Date  04/21/18  PT LONG TERM GOAL #2   Title  Patient ambulates Modified Independent 500' outdoors including grass, pavement, curbs, ramps with LRAD to improve community ambulatory skills    Time  3    Period  Months    Status  Revised    Target Date  04/21/18      PT LONG TERM GOAL #3   Title  Berg Balance >/= 45/56 to indicate lower fall risk.     Time  3    Period  Months    Status  On-going    Target Date  04/21/18      PT LONG TERM GOAL #4   Title  Timed Up-Go time with cane <13.5 seconds to indicate lower fall risk.     Time  3    Period  Months    Status  On-going    Target Date  04/21/18      PT LONG TERM GOAL #5   Title  Patient ambulates household activities including negotiating furniture, scanning & carrying light items with cane modified independent.     Time  3    Period  Months    Status  New    Target Date  04/21/18         02/10/18 1409  Plan  Clinical Impression Statement Today's skilled session focused on core strengthening and hip flexor stretching without any issues reported. [name] present at start of session and reinforced with her that the pt was not ready for full use of cane just yet. She agreed. The pt is progressing toward goals and should benefit from continued PT to progress toward unmet goals.   Pt will benefit from skilled therapeutic intervention in order to improve on the following deficits Abnormal gait;Decreased activity tolerance;Decreased balance;Decreased endurance;Decreased knowledge of use of DME;Decreased mobility;Decreased range of motion;Decreased safety awareness;Decreased strength;Impaired flexibility;Postural dysfunction;Dizziness  Rehab Potential Good  Clinical  Impairments Affecting Rehab Potential progressive decline over 2 years, good attitude and family support with 4 children in area.   PT Frequency 2x / week  PT Duration 12 weeks  PT Treatment/Interventions Gait training;Functional mobility training;Neuromuscular re-education;Balance training;Therapeutic exercise;Therapeutic activities;Patient/family education;ADLs/Self Care Home Management;Stair training;Orthotic Fit/Training;Manual techniques;Vestibular;Canalith Repostioning;Aquatic Therapy  PT Next Visit Plan provide pt with his aquatic PT schedule, gait & balance activities (especially incorporating compliant surfaces and EC   PT Home Exercise Plan [facility]  Consulted and Agree with Plan of Care Patient  Family Member Consulted daughter          Patient will benefit from skilled therapeutic intervention in order to improve the following deficits and impairments:  Abnormal gait, Decreased activity tolerance, Decreased balance, Decreased endurance, Decreased knowledge of use of DME, Decreased mobility, Decreased range of motion, Decreased safety awareness, Decreased strength, Impaired flexibility, Postural dysfunction, Dizziness  Visit Diagnosis: Unsteadiness on feet  Muscle weakness (generalized)     Problem List Patient Active Problem List   Diagnosis Date Noted  . Bradycardia 09/16/2017  . CKD (chronic kidney disease), stage III ([place]) 09/15/2017  . Near syncope 09/14/2017  . Post-operative pain   . Dementia without behavioral disturbance ([place])   . TIA (transient ischemic attack)   . Reactive hypertension   . Bimalleolar fracture of left ankle 05/03/2017  . Axonal neuropathy 03/21/2017  . Right sided weakness 03/01/2017  . Cigarette smoker 06/16/2016  . Dyspnea on exertion 06/15/2016  . Alzheimer disease ([place]) 01/07/2014  . CSA (central sleep apnea) 10/05/2013  . Syncope 06/26/2013  . COPD GOLD II 06/25/2013  .  BPH (benign prostatic hyperplasia) 05/18/2013  . Glaucoma  05/18/2013  . S/P right TH revision 05/14/2013  . Constipation 03/26/2013  . Osteoporosis 03/26/2013  . S/P right THA, AA 03/20/2013  . Hyperlipidemia   . Erectile dysfunction   . History of asbestos exposure   . Nocturia   . History of shingles   . Dermatitis, atopic   . OA (osteoarthritis) of hip   . Arthritis   . Frequency of urination   . Lumbar scoliosis   . Malignant neoplasm of prostate ([place]) 06/07/2011  . 85 year old gentleman with stage T3 adenocarcinoma prostate with Gleason score 4+4 and PSA of 10.3 05/11/2011    Willow Ora, PTA, [facility] [address], [place], [place] [address] [phone] 02/12/18, 7:57 PM   Name: Derek Carr MRN: [id number] Date of Birth: 1931/08/29

## 2018-02-14 ENCOUNTER — Encounter

## 2018-02-14 ENCOUNTER — Ambulatory Visit: Payer: Medicare HMO

## 2018-02-14 DIAGNOSIS — R2689 Other abnormalities of gait and mobility: Secondary | ICD-10-CM

## 2018-02-14 DIAGNOSIS — R42 Dizziness and giddiness: Secondary | ICD-10-CM | POA: Diagnosis not present

## 2018-02-14 DIAGNOSIS — R278 Other lack of coordination: Secondary | ICD-10-CM | POA: Diagnosis not present

## 2018-02-14 DIAGNOSIS — R41841 Cognitive communication deficit: Secondary | ICD-10-CM

## 2018-02-14 DIAGNOSIS — M6281 Muscle weakness (generalized): Secondary | ICD-10-CM

## 2018-02-14 DIAGNOSIS — M25672 Stiffness of left ankle, not elsewhere classified: Secondary | ICD-10-CM | POA: Diagnosis not present

## 2018-02-14 DIAGNOSIS — R2681 Unsteadiness on feet: Secondary | ICD-10-CM | POA: Diagnosis not present

## 2018-02-14 NOTE — Therapy (Signed)
[facility] [address] [place], [place], [address] Phone: [phone]   Fax:  [phone]  Physical Therapy Treatment  Patient Details  Name: Derek Carr MRN: [id number] Date of Birth: 1931-12-01 Referring Provider (PT): Mechele Claude, [place]   Encounter Date: 02/14/2018  PT End of Session - 02/14/18 1334    Visit Number  42    Number of Visits  58    Date for PT Re-Evaluation  04/24/18    Authorization Type  [employer] Medicare & Generic commercial 2nd    Authorization Time Period  Progress Note due visit 64    PT Start Time  1320    PT Stop Time  1400    PT Time Calculation (min)  40 min    Equipment Utilized During Treatment  Gait belt    Activity Tolerance  Patient tolerated treatment well;No increased pain    Behavior During Therapy  WFL for tasks assessed/performed       Past Medical History:  Diagnosis Date  . AAA (abdominal aortic aneurysm) ([place])   . Anemia   . Ankle fracture    left  . Aortic regurgitation   . Atherosclerosis   . Complication of anesthesia    CONFUSION - Pt's family very concerned about this  . DDD (degenerative disc disease)   . Degenerative arthritis   . Dermatitis, atopic ARMS AND LEGS  . Diverticulosis   . Erectile dysfunction   . Frequency of urination   . Glaucoma   . History of asbestos exposure   . History of radiation therapy 8/19-13-10/18/11   prostate, 55 GY  . History of shingles 2012-- BILATERAL EYES--  NO RESIDUAL  . Hyperlipidemia   . Hypertension   . Inguinal hernia   . Lumbar scoliosis   . Nocturia   . OA (osteoarthritis) of hip RIGHT  . Prostate cancer ([place]) 05/11/2011   bx=Adenocarcinoma,gleason3+4=7, 4+4=8,PSA=10.30volume=45.7cc  . Renal cyst    bilateral  . Smokers' cough ([place])   . Stroke ([place])   . Thyroid cyst     Past Surgical History:  Procedure Laterality Date  . ATTEMPTED LEFT VATS/ LEFT THORACOTOMY/ RESECTION OF THE ENORMOUS, PROBABLE  BRONCHOGENIC CYST  01-04-2005  DR Arlyce Dice  . CARDIAC CATHETERIZATION  02-24-2009  DR NASHER   MINOR CORONARY ARTERY IRREGULARITIES/ NORMAL LVSF/ EF 65-70%  . CATARACT EXTRACTION    . COLONOSCOPY W/ POLYPECTOMY    . CYSTOSCOPY  11/15/2011   Procedure: CYSTOSCOPY;  Surgeon: Dutch Gray, MD;  Location[facility];  Service: Urology;  Laterality: N/A;  no seeds seen in bladder  . esophageus cyst removal  YRS AGO  . [place]  . ORIF ANKLE FRACTURE Left 05/03/2017  . ORIF ANKLE FRACTURE Left 05/03/2017   Procedure: OPEN REDUCTION INTERNAL FIXATION (ORIF) BIMALLEOLAR  ANKLE FRACTURE;  Surgeon: Wylene Simmer, MD;  Location: [facility];  Service: Orthopedics;  Laterality: Left;  . PACEMAKER IMPLANT N/A 09/16/2017   Procedure: PACEMAKER IMPLANT;  Surgeon: Constance Haw, MD;  Location: [facility];  Service: Cardiovascular;  Laterality: N/A;  . PROSTATE BIOPSY  05/11/11   Adenocarcinoma ([facility] OFFICE)  . RADIOACTIVE SEED IMPLANT  11/15/2011   Procedure: RADIOACTIVE SEED IMPLANT;  Surgeon: Dutch Gray, MD;  Location: [facility];  Service: Urology;  Laterality: N/A;  Total number of seeds - 52  . REPAIR RIGHT INGUINAL HERNIA W/ MESH  09-10-1999  . TOTAL HIP ARTHROPLASTY Right 03/20/2013   Procedure:  RIGHT TOTAL HIP ARTHROPLASTY ANTERIOR APPROACH;  Surgeon: [name], MD;  Location: [facility];  Service: Orthopedics;  Laterality: Right;  . TOTAL HIP REVISION Right 05/14/2013   Procedure: open reduction internal fixation REVISION RIGHT HIP ;  Surgeon: [name], MD;  Location: [facility];  Service: Orthopedics;  Laterality: Right;  . UMBILICAL HERNIA REPAIR  1998   epigastric    There were no vitals filed for this visit.  Subjective Assessment - 02/14/18 1332    Subjective  No falls to report, HEP is going well.     Patient is accompained by:  Family member    Pertinent History  left ankle fracture, TIAs, DDD, Lumbar scoliosis, Glaucoma, cataract  surgery, HTN, right THR 03/20/13 with revision 05/14/13, right femur fracture, dementia/Alzheimers    Limitations  Standing;Walking;House hold activities    Patient Stated Goals  Patient wants to improve his balance & walking    Currently in Pain?  No/denies       [facility] Adult PT Treatment/Exercise - 02/14/18 1335      Ambulation/Gait   Ambulation/Gait  Yes    Ambulation/Gait Assistance  4: Min guard    Ambulation/Gait Assistance Details  gait with SBQC initially with no LOB or instability, second trial with quad tipped cane with min/mod instability during ambulation.     Ambulation Distance (Feet)  115 Feet   115   Assistive device  Straight cane;Small based quad cane   with rubber quad tip   Gait Pattern  Step-through pattern;Decreased stance time - left;Decreased step length - right;Decreased weight shift to left;Left hip hike;Antalgic;Lateral hip instability;Trunk flexed    Ambulation Surface  Level;Indoor      High Level Balance   High Level Balance Activities  Side stepping;Other (comment)   cone taps   High Level Balance Comments  at counter with BUE progressing to SUE support with black foam beam over blue mat, min instability with max cues for forward gaze throughout tasks. One seated rest break required.          PT Short Term Goals - 01/24/18 1409      PT SHORT TERM GOAL #1   Title  Pt will be independent with updated HEP exercises (All STGs Target Date 02/24/2018)    Time  1    Period  Months    Status  Revised    Target Date  02/24/18      PT SHORT TERM GOAL #2   Title  Patient ambulates 500' outdoor surfaces including grass with RW with supervision.     Time  1    Period  Months    Status  Revised    Target Date  02/24/18      PT SHORT TERM GOAL #3   Title  Timed Up & Go with cane <15sec with supervision.     Time  1    Period  Months    Status  On-going    Target Date  02/24/18      PT SHORT TERM GOAL #4   Title  Berg Balance >/= 39/56    Time  1     Period  Months    Status  Revised    Target Date  02/24/18      PT SHORT TERM GOAL #5   Title  Patient ambulates 100' around furniture with cane with supervision.     Time  1    Period  Months    Status  New  Target Date  02/24/18        PT Long Term Goals - 01/25/18 1417      PT LONG TERM GOAL #1   Title  Pt will demonstrate independence with ongoing HEP program and fitness plan including returning to aquatic class at [facility] (All LTGs Target Date 04/21/2018)    Time  3    Period  Months    Status  On-going    Target Date  04/21/18      PT LONG TERM GOAL #2   Title  Patient ambulates Modified Independent 500' outdoors including grass, pavement, curbs, ramps with LRAD to improve community ambulatory skills    Time  3    Period  Months    Status  Revised    Target Date  04/21/18      PT LONG TERM GOAL #3   Title  Berg Balance >/= 45/56 to indicate lower fall risk.     Time  3    Period  Months    Status  On-going    Target Date  04/21/18      PT LONG TERM GOAL #4   Title  Timed Up-Go time with cane <13.5 seconds to indicate lower fall risk.     Time  3    Period  Months    Status  On-going    Target Date  04/21/18      PT LONG TERM GOAL #5   Title  Patient ambulates household activities including negotiating furniture, scanning & carrying light items with cane modified independent.     Time  3    Period  Months    Status  New    Target Date  04/21/18        Plan - 02/14/18 1535    Clinical Impression Statement  Todays skilled session focused on gait training with rubber quad tipped cane vs. SBQC with increased stability with SBQC, and high level balance on compliant surfaces. Pt initially unsure but agreed to attend aquatic therapy sessions to seek potential benefit. Pt should benefit from continued PT sessions to progress towards goals.     Rehab Potential  Good    Clinical Impairments Affecting Rehab Potential  progressive decline over 2 years, good  attitude and family support with 4 children in area.     PT Frequency  2x / week    PT Duration  12 weeks    PT Treatment/Interventions  Gait training;Functional mobility training;Neuromuscular re-education;Balance training;Therapeutic exercise;Therapeutic activities;Patient/family education;ADLs/Self Care Home Management;Stair training;Orthotic Fit/Training;Manual techniques;Vestibular;Canalith Repostioning;Aquatic Therapy    PT Next Visit Plan  gait with [facility] & balance activities (especially incorporating compliant surfaces and EC)    PT Home Exercise Plan  [place] and Agree with Plan of Care  Patient    Family Member Consulted  daughter       Patient will benefit from skilled therapeutic intervention in order to improve the following deficits and impairments:  Abnormal gait, Decreased activity tolerance, Decreased balance, Decreased endurance, Decreased knowledge of use of DME, Decreased mobility, Decreased range of motion, Decreased safety awareness, Decreased strength, Impaired flexibility, Postural dysfunction, Dizziness  Visit Diagnosis: Unsteadiness on feet  Muscle weakness (generalized)  Other abnormalities of gait and mobility  Other lack of coordination  Cognitive communication deficit     Problem List Patient Active Problem List   Diagnosis Date Noted  . Bradycardia 09/16/2017  . CKD (chronic kidney disease), stage III ([place]) 09/15/2017  . Near syncope  09/14/2017  . Post-operative pain   . Dementia without behavioral disturbance ([place])   . TIA (transient ischemic attack)   . Reactive hypertension   . Bimalleolar fracture of left ankle 05/03/2017  . Axonal neuropathy 03/21/2017  . Right sided weakness 03/01/2017  . Cigarette smoker 06/16/2016  . Dyspnea on exertion 06/15/2016  . Alzheimer disease ([place]) 01/07/2014  . CSA (central sleep apnea) 10/05/2013  . Syncope 06/26/2013  . COPD GOLD II 06/25/2013  . BPH (benign prostatic hyperplasia) 05/18/2013   . Glaucoma 05/18/2013  . S/P right TH revision 05/14/2013  . Constipation 03/26/2013  . Osteoporosis 03/26/2013  . S/P right THA, AA 03/20/2013  . Hyperlipidemia   . Erectile dysfunction   . History of asbestos exposure   . Nocturia   . History of shingles   . Dermatitis, atopic   . OA (osteoarthritis) of hip   . Arthritis   . Frequency of urination   . Lumbar scoliosis   . Malignant neoplasm of prostate ([place]) 06/07/2011  . 85 year old gentleman with stage T3 adenocarcinoma prostate with Gleason score 4+4 and PSA of 10.3 05/11/2011   Hadlyn Amero, PTA  Shakea Isip A Evangela Heffler 02/14/2018, 3:40 PM  [facility] [address] [place], [place], [address] Phone: [phone]   Fax:  [phone]  Name: NORVIN OHLIN MRN: [id number] Date of Birth: 1931/06/08

## 2018-02-15 ENCOUNTER — Encounter: Payer: Self-pay | Admitting: Physical Therapy

## 2018-02-15 ENCOUNTER — Ambulatory Visit: Payer: Medicare HMO | Admitting: Physical Therapy

## 2018-02-15 DIAGNOSIS — R2681 Unsteadiness on feet: Secondary | ICD-10-CM

## 2018-02-15 DIAGNOSIS — M25672 Stiffness of left ankle, not elsewhere classified: Secondary | ICD-10-CM | POA: Diagnosis not present

## 2018-02-15 DIAGNOSIS — R41841 Cognitive communication deficit: Secondary | ICD-10-CM | POA: Diagnosis not present

## 2018-02-15 DIAGNOSIS — R2689 Other abnormalities of gait and mobility: Secondary | ICD-10-CM

## 2018-02-15 DIAGNOSIS — M6281 Muscle weakness (generalized): Secondary | ICD-10-CM | POA: Diagnosis not present

## 2018-02-15 DIAGNOSIS — R42 Dizziness and giddiness: Secondary | ICD-10-CM | POA: Diagnosis not present

## 2018-02-15 DIAGNOSIS — R278 Other lack of coordination: Secondary | ICD-10-CM | POA: Diagnosis not present

## 2018-02-16 ENCOUNTER — Telehealth: Payer: Self-pay | Admitting: Adult Health

## 2018-02-16 DIAGNOSIS — G4733 Obstructive sleep apnea (adult) (pediatric): Secondary | ICD-10-CM | POA: Diagnosis not present

## 2018-02-16 NOTE — Telephone Encounter (Signed)
Daughter called and wants someone to call her back stated that her father's referral was sent to the wrong place/ she had lm on voice mail in the new patient referral voice mail. Please call [phone]

## 2018-02-17 ENCOUNTER — Ambulatory Visit: Payer: Medicare HMO

## 2018-02-17 ENCOUNTER — Telehealth: Payer: Self-pay | Admitting: Adult Health

## 2018-02-17 ENCOUNTER — Encounter: Payer: Medicare HMO | Admitting: Psychology

## 2018-02-17 NOTE — Telephone Encounter (Signed)
Unable to get in contact with patient's daughter. I will attempt to call back at a later time.

## 2018-02-17 NOTE — Telephone Encounter (Signed)
Pt daughter(Robin on DPR) has called back re: the referral needed for pt.  She has not heard back from anyone, please call

## 2018-02-17 NOTE — Telephone Encounter (Signed)
I spoke to patient daughter [name] she was not sure why her dad was seeing Dr. Donna Christen and not Dr. Hassell Done for the Neuro Ophthalmology.   I spoke to Dr. Hassell Done office and Mendel Ryder informed me that Dr. Donna Christen is a residency doctor and that he is under Dr. Hassell Done and he will be there. That is what they do on Friday's to get patients seen sooer.  I informed [name] of this and that he will see Dr. Hassell Done and Dr. Donna Christen on 04/14/18. I informed her that is a way for patients to be seen sooner by Dr. Hassell Done if not he would not be able to see Dr. Hassell Done until June.

## 2018-02-17 NOTE — Telephone Encounter (Signed)
Noted, thank you

## 2018-02-17 NOTE — Telephone Encounter (Signed)
Pt daughter(Robin on DPR) called re: the topiramate (TOPAMAX) 25 MG tablet she states within the past 2 week pt is no worse off but there has not been any other changes either, please call

## 2018-02-17 NOTE — Therapy (Signed)
[facility] [address] [place], [place], [address] Phone: [phone]   Fax:  [phone]  Physical Therapy Treatment  Patient Details  Name: Derek Carr MRN: [id number] Date of Birth: [birth year] Referring Provider (PT): Mechele Claude, [place]   Encounter Date: 02/15/2018   02/15/18 1323  PT Visits / Re-Eval  Visit Number 43  Number of Visits 76  Date for PT Re-Evaluation 04/24/18  Authorization  Authorization Type [employer] Medicare & Generic commercial 2nd  Authorization Time Period Progress Note due visit 16  PT Time Calculation  PT Start Time 1318  PT Stop Time 1400  PT Time Calculation (min) 42 min  PT - End of Session  Equipment Utilized During Treatment Gait belt  Activity Tolerance Patient tolerated treatment well;No increased pain  Behavior During Therapy WFL for tasks assessed/performed     Past Medical History:  Diagnosis Date  . AAA (abdominal aortic aneurysm) ([place])   . Anemia   . Ankle fracture    left  . Aortic regurgitation   . Atherosclerosis   . Complication of anesthesia    CONFUSION - Pt's family very concerned about this  . DDD (degenerative disc disease)   . Degenerative arthritis   . Dermatitis, atopic ARMS AND LEGS  . Diverticulosis   . Erectile dysfunction   . Frequency of urination   . Glaucoma   . History of asbestos exposure   . History of radiation therapy 8/19-13-10/18/11   prostate, 72 GY  . History of shingles 2012-- BILATERAL EYES--  NO RESIDUAL  . Hyperlipidemia   . Hypertension   . Inguinal hernia   . Lumbar scoliosis   . Nocturia   . OA (osteoarthritis) of hip RIGHT  . Prostate cancer ([place]) 05/11/2011   bx=Adenocarcinoma,gleason3+4=7, 4+4=8,PSA=10.30volume=45.7cc  . Renal cyst    bilateral  . Smokers' cough ([place])   . Stroke ([place])   . Thyroid cyst     Past Surgical History:  Procedure Laterality Date  . ATTEMPTED LEFT VATS/ LEFT THORACOTOMY/ RESECTION OF THE  ENORMOUS, PROBABLE BRONCHOGENIC CYST  01-04-2005  DR Arlyce Dice  . CARDIAC CATHETERIZATION  02-24-2009  DR NASHER   MINOR CORONARY ARTERY IRREGULARITIES/ NORMAL LVSF/ EF 65-70%  . CATARACT EXTRACTION    . COLONOSCOPY W/ POLYPECTOMY    . CYSTOSCOPY  11/15/2011   Procedure: CYSTOSCOPY;  Surgeon: Dutch Gray, MD;  [facility];  Service: Urology;  Laterality: N/A;  no seeds seen in bladder  . esophageus cyst removal  YRS AGO  . [place]  . ORIF ANKLE FRACTURE Left 05/03/2017  . ORIF ANKLE FRACTURE Left 05/03/2017   Procedure: OPEN REDUCTION INTERNAL FIXATION (ORIF) BIMALLEOLAR  ANKLE FRACTURE;  Surgeon: Wylene Simmer, MD;  Location: [facility];  Service: Orthopedics;  Laterality: Left;  . PACEMAKER IMPLANT N/A 09/16/2017   Procedure: PACEMAKER IMPLANT;  Surgeon: Constance Haw, MD;  Location: [facility];  Service: Cardiovascular;  Laterality: N/A;  . PROSTATE BIOPSY  05/11/11   Adenocarcinoma ([facility] OFFICE)  . RADIOACTIVE SEED IMPLANT  11/15/2011   Procedure: RADIOACTIVE SEED IMPLANT;  Surgeon: Dutch Gray, MD;  Location: [facility];  Service: Urology;  Laterality: N/A;  Total number of seeds - 52  . REPAIR RIGHT INGUINAL HERNIA W/ MESH  09-10-1999  . TOTAL HIP ARTHROPLASTY Right 03/20/2013   Procedure: RIGHT TOTAL HIP ARTHROPLASTY ANTERIOR APPROACH;  Surgeon: [name], MD;  [facility];  Service: Orthopedics;  Laterality: Right;  .  TOTAL HIP REVISION Right 05/14/2013   Procedure: open reduction internal fixation REVISION RIGHT HIP ;  Surgeon: [name], MD;  Location: [facility];  Service: Orthopedics;  Laterality: Right;  . UMBILICAL HERNIA REPAIR  1998   epigastric    There were no vitals filed for this visit.     02/15/18 1322  Symptoms/Limitations  Subjective No new complaints. No falls or pain to report.   Patient is accompained by: Family member  Pertinent History left ankle fracture, TIAs, DDD, Lumbar scoliosis,  Glaucoma, cataract surgery, HTN, right THR 03/20/13 with revision 05/14/13, right femur fracture, dementia/Alzheimers  Patient Stated Goals Patient wants to improve his balance & walking  Pain Assessment  Currently in Pain? No/denies  Pain Score 0      02/15/18 1323  Ambulation/Gait  Ambulation/Gait Yes  Ambulation/Gait Assistance 5: Supervision  Ambulation/Gait Assistance Details pt's left front wheel of walker noted to have a broken axel causing it to wobble. Pt's daughter states this walker was given to him, not purchased with insurance. Will look into getting new walker for patient.   Ambulation Distance (Feet) 100 Feet (x2)  Assistive device Rolling walker  Gait Pattern Step-through pattern;Decreased stance time - left;Decreased step length - right;Decreased weight shift to left;Left hip hike;Antalgic;Lateral hip instability;Trunk flexed  Ambulation Surface Level;Indoor  High Level Balance  High Level Balance Activities Side stepping;Tandem walking;Marching forwards;Marching backwards (tandem fwd/bwd)  High Level Balance Comments on blue mat in parallel bars: 3 laps each with cues on correct form/technique, light UE support on bars for balance with min guard assist  Neuro Re-ed   Neuro Re-ed Details  for balance/NMR: on 2 pillows in corner with chair in front from safety, feet hip width apart: EC no head movements, progressing to EC head movements left<>right, up<>down, and diagonals both ways with min guard to min assist for balance, cues for posture and ex form/technique.    Lumbar Exercises: Seated  Other Seated Lumbar Exercises seated on green air disc: with 2# weight dowel rod- UE raises x 10 reps, then holding dowel rod at shoulder height uppert trunk rotation left<>right x 10 reps each way; with red band- rows with 5 sec holds, shoulder extension x 10 reps, shoulder horizontal abduction x 10 reps, then alternating diagonal pulls x 10 each way. cues for abd bracing and ex form.    Knee/Hip Exercises: Stretches  Hip Flexor Stretch Both;Limitations;1 rep (2 minute holds)  Hip Flexor Stretch Limitations supine at edge of mat with leg off edge and foot on ground.         PT Short Term Goals - 01/24/18 1409      PT SHORT TERM GOAL #1   Title  Pt will be independent with updated HEP exercises (All STGs Target Date 02/24/2018)    Time  1    Period  Months    Status  Revised    Target Date  02/24/18      PT SHORT TERM GOAL #2   Title  Patient ambulates 500' outdoor surfaces including grass with RW with supervision.     Time  1    Period  Months    Status  Revised    Target Date  02/24/18      PT SHORT TERM GOAL #3   Title  Timed Up & Go with cane <15sec with supervision.     Time  1    Period  Months    Status  On-going    Target  Date  02/24/18      PT SHORT TERM GOAL #4   Title  Berg Balance >/= 39/56    Time  1    Period  Months    Status  Revised    Target Date  02/24/18      PT SHORT TERM GOAL #5   Title  Patient ambulates 100' around furniture with cane with supervision.     Time  1    Period  Months    Status  New    Target Date  02/24/18        PT Long Term Goals - 01/25/18 1417      PT LONG TERM GOAL #1   Title  Pt will demonstrate independence with ongoing HEP program and fitness plan including returning to aquatic class at [facility] (All LTGs Target Date 04/21/2018)    Time  3    Period  Months    Status  On-going    Target Date  04/21/18      PT LONG TERM GOAL #2   Title  Patient ambulates Modified Independent 500' outdoors including grass, pavement, curbs, ramps with LRAD to improve community ambulatory skills    Time  3    Period  Months    Status  Revised    Target Date  04/21/18      PT LONG TERM GOAL #3   Title  Berg Balance >/= 45/56 to indicate lower fall risk.     Time  3    Period  Months    Status  On-going    Target Date  04/21/18      PT LONG TERM GOAL #4   Title  Timed Up-Go time with cane <13.5  seconds to indicate lower fall risk.     Time  3    Period  Months    Status  On-going    Target Date  04/21/18      PT LONG TERM GOAL #5   Title  Patient ambulates household activities including negotiating furniture, scanning & carrying light items with cane modified independent.     Time  3    Period  Months    Status  New    Target Date  04/21/18         02/15/18 1323  Plan  Clinical Impression Statement Today's skilled session focused on postural strengthening and balance reactions for improved carryover with gait using lesser AD. The pt is making slow, steady progress toward goals and should benefit from continued PT to progress toward unmet goals.   Pt will benefit from skilled therapeutic intervention in order to improve on the following deficits Abnormal gait;Decreased activity tolerance;Decreased balance;Decreased endurance;Decreased knowledge of use of DME;Decreased mobility;Decreased range of motion;Decreased safety awareness;Decreased strength;Impaired flexibility;Postural dysfunction;Dizziness  Rehab Potential Good  Clinical Impairments Affecting Rehab Potential progressive decline over 2 years, good attitude and family support with 4 children in area.   PT Frequency 2x / week  PT Duration 12 weeks  PT Treatment/Interventions Gait training;Functional mobility training;Neuromuscular re-education;Balance training;Therapeutic exercise;Therapeutic activities;Patient/family education;ADLs/Self Care Home Management;Stair training;Orthotic Fit/Training;Manual techniques;Vestibular;Canalith Repostioning;Aquatic Therapy  PT Next Visit Plan pt begins aquatic therapy next week; continue to address posture, strengthening and gait with cane   PT [place] and Agree with Plan of Care Patient  Family Member Consulted daughter          Patient will benefit from skilled therapeutic intervention in order to improve the following deficits and  impairments:   Abnormal gait, Decreased activity tolerance, Decreased balance, Decreased endurance, Decreased knowledge of use of DME, Decreased mobility, Decreased range of motion, Decreased safety awareness, Decreased strength, Impaired flexibility, Postural dysfunction, Dizziness  Visit Diagnosis: Unsteadiness on feet  Muscle weakness (generalized)  Other abnormalities of gait and mobility     Problem List Patient Active Problem List   Diagnosis Date Noted  . Bradycardia 09/16/2017  . CKD (chronic kidney disease), stage III ([place]) 09/15/2017  . Near syncope 09/14/2017  . Post-operative pain   . Dementia without behavioral disturbance ([place])   . TIA (transient ischemic attack)   . Reactive hypertension   . Bimalleolar fracture of left ankle 05/03/2017  . Axonal neuropathy 03/21/2017  . Right sided weakness 03/01/2017  . Cigarette smoker 06/16/2016  . Dyspnea on exertion 06/15/2016  . Alzheimer disease ([place]) 01/07/2014  . CSA (central sleep apnea) 10/05/2013  . Syncope 06/26/2013  . COPD GOLD II 06/25/2013  . BPH (benign prostatic hyperplasia) 05/18/2013  . Glaucoma 05/18/2013  . S/P right TH revision 05/14/2013  . Constipation 03/26/2013  . Osteoporosis 03/26/2013  . S/P right THA, AA 03/20/2013  . Hyperlipidemia   . Erectile dysfunction   . History of asbestos exposure   . Nocturia   . History of shingles   . Dermatitis, atopic   . OA (osteoarthritis) of hip   . Arthritis   . Frequency of urination   . Lumbar scoliosis   . Malignant neoplasm of prostate ([place]) 06/07/2011  . 85 year old gentleman with stage T3 adenocarcinoma prostate with Gleason score 4+4 and PSA of 10.3 05/11/2011    Willow Ora, PTA, [facility] Outpatient Neuro [facility] [address] [place], [place] [address] [phone] 02/17/18, 9:17 AM   Name: Derek Carr MRN: [id number] Date of Birth: 11/30/1931

## 2018-02-20 ENCOUNTER — Ambulatory Visit: Payer: Medicare HMO | Admitting: Physical Therapy

## 2018-02-20 DIAGNOSIS — R2681 Unsteadiness on feet: Secondary | ICD-10-CM

## 2018-02-20 DIAGNOSIS — M25672 Stiffness of left ankle, not elsewhere classified: Secondary | ICD-10-CM | POA: Diagnosis not present

## 2018-02-20 DIAGNOSIS — R2689 Other abnormalities of gait and mobility: Secondary | ICD-10-CM | POA: Diagnosis not present

## 2018-02-20 DIAGNOSIS — R41841 Cognitive communication deficit: Secondary | ICD-10-CM | POA: Diagnosis not present

## 2018-02-20 DIAGNOSIS — M6281 Muscle weakness (generalized): Secondary | ICD-10-CM

## 2018-02-20 DIAGNOSIS — R278 Other lack of coordination: Secondary | ICD-10-CM | POA: Diagnosis not present

## 2018-02-20 DIAGNOSIS — R42 Dizziness and giddiness: Secondary | ICD-10-CM | POA: Diagnosis not present

## 2018-02-20 MED ORDER — TOPIRAMATE 50 MG PO TABS: 25.0000 mg | ORAL_TABLET | Freq: Every day | ORAL | 3 refills | Status: DC

## 2018-02-20 NOTE — Telephone Encounter (Signed)
It is recommended to increase Topamax at this time from 25 mg daily to 50 mg daily.  Order will be placed.

## 2018-02-20 NOTE — Telephone Encounter (Signed)
Left vm for patients daughter [name] to call back about her fathers Topamax.

## 2018-02-20 NOTE — Addendum Note (Signed)
Addended by: Venancio Poisson on: 02/20/2018 04:38 PM   Modules accepted: Orders

## 2018-02-21 ENCOUNTER — Encounter: Payer: Self-pay | Admitting: Physical Therapy

## 2018-02-21 ENCOUNTER — Ambulatory Visit: Payer: Medicare HMO | Admitting: Physical Therapy

## 2018-02-21 NOTE — Therapy (Signed)
[facility] [address] [place], [place], [address] Phone: [phone]   Fax:  [phone]  Physical Therapy Treatment  Patient Details  Name: Derek Carr MRN: [id number] Date of Birth: 03-Apr-1931 Referring Provider (PT): Mechele Claude, [place]   Encounter Date: 02/20/2018  PT End of Session - 02/21/18 1002    Visit Number  86    Number of Visits  58    Date for PT Re-Evaluation  04/24/18    Authorization Type  [employer] Medicare & Generic commercial 2nd    Authorization Time Period  Progress Note due visit 91    PT Start Time  1545    PT Stop Time  1630    PT Time Calculation (min)  45 min    Equipment Utilized During Treatment  Other (comment)   noodle & bar bell used for UE support   Activity Tolerance  Patient tolerated treatment well    Behavior During Therapy  WFL for tasks assessed/performed       Past Medical History:  Diagnosis Date  . AAA (abdominal aortic aneurysm) ([place])   . Anemia   . Ankle fracture    left  . Aortic regurgitation   . Atherosclerosis   . Complication of anesthesia    CONFUSION - Pt's family very concerned about this  . DDD (degenerative disc disease)   . Degenerative arthritis   . Dermatitis, atopic ARMS AND LEGS  . Diverticulosis   . Erectile dysfunction   . Frequency of urination   . Glaucoma   . History of asbestos exposure   . History of radiation therapy 8/19-13-10/18/11   prostate, 68 GY  . History of shingles 2012-- BILATERAL EYES--  NO RESIDUAL  . Hyperlipidemia   . Hypertension   . Inguinal hernia   . Lumbar scoliosis   . Nocturia   . OA (osteoarthritis) of hip RIGHT  . Prostate cancer ([place]) 05/11/2011   bx=Adenocarcinoma,gleason3+4=7, 4+4=8,PSA=10.30volume=45.7cc  . Renal cyst    bilateral  . Smokers' cough ([place])   . Stroke ([place])   . Thyroid cyst     Past Surgical History:  Procedure Laterality Date  . ATTEMPTED LEFT VATS/ LEFT THORACOTOMY/ RESECTION OF THE  ENORMOUS, PROBABLE BRONCHOGENIC CYST  01-04-2005  DR Arlyce Dice  . CARDIAC CATHETERIZATION  02-24-2009  DR NASHER   MINOR CORONARY ARTERY IRREGULARITIES/ NORMAL LVSF/ EF 65-70%  . CATARACT EXTRACTION    . COLONOSCOPY W/ POLYPECTOMY    . CYSTOSCOPY  11/15/2011   Procedure: CYSTOSCOPY;  Surgeon: Dutch Gray, MD;  [facility]  Service: Urology;  Laterality: N/A;  no seeds seen in bladder  . esophageus cyst removal  YRS AGO  . [place]  . ORIF ANKLE FRACTURE Left 05/03/2017  . ORIF ANKLE FRACTURE Left 05/03/2017   Procedure: OPEN REDUCTION INTERNAL FIXATION (ORIF) BIMALLEOLAR  ANKLE FRACTURE;  Surgeon: Wylene Simmer, MD;  Location: [facility];  Service: Orthopedics;  Laterality: Left;  . PACEMAKER IMPLANT N/A 09/16/2017   Procedure: PACEMAKER IMPLANT;  Surgeon: Constance Haw, MD;  Location: [facility];  Service: Cardiovascular;  Laterality: N/A;  . PROSTATE BIOPSY  05/11/11   Adenocarcinoma ([facility] OFFICE)  . RADIOACTIVE SEED IMPLANT  11/15/2011   Procedure: RADIOACTIVE SEED IMPLANT;  Surgeon: Dutch Gray, MD;  Location: [facility]  Service: Urology;  Laterality: N/A;  Total number of seeds - 52  . REPAIR RIGHT INGUINAL HERNIA W/ MESH  09-10-1999  . TOTAL  HIP ARTHROPLASTY Right 03/20/2013   Procedure: RIGHT TOTAL HIP ARTHROPLASTY ANTERIOR APPROACH;  Surgeon: [name], MD;  Location[facility];  Service: Orthopedics;  Laterality: Right;  . TOTAL HIP REVISION Right 05/14/2013   Procedure: open reduction internal fixation REVISION RIGHT HIP ;  Surgeon: [name], MD;  Location: [facility];  Service: Orthopedics;  Laterality: Right;  . UMBILICAL HERNIA REPAIR  1998   epigastric    There were no vitals filed for this visit.  Subjective Assessment - 02/21/18 0959    Subjective  Pt presents for aquatic therapy at [facility] - daughter mistakenly thought pt was scheduled for 3:00 appt; daughter was informed that 3:45 appt time  was open if they wanted to come back for aquatic therapy - she said they definitely would return for the 3:45 appt.    Pt reported no dizziness at pool prior to appt   Patient is accompained by:  Family member    Pertinent History  left ankle fracture, TIAs, DDD, Lumbar scoliosis, Glaucoma, cataract surgery, HTN, right THR 03/20/13 with revision 05/14/13, right femur fracture, dementia/Alzheimers    Limitations  Standing;Walking;House hold activities    Patient Stated Goals  Patient wants to improve his balance & walking    Currently in Pain?  No/denies         Aquatic therapy - pool temp 87.2 degrees  Patient seen for aquatic therapy today.  Treatment took place in water 3.5-4 feet deep depending upon activity.  Pt entered and exited  the pool via step negotiation with use of bil. Hand rails using a step by step sequence with CGA for safety.  Pt performed standing hip strengthening exercises bil. LE's - hip flexion, extension, abduction and hip flexion/extension with knee flexed at 90 degrees 10 reps each exercise on each leg Squats x 10 reps with pt holding onto side of pool Standing marching - initially with UE support on edge of pool, progressing to marching without UE support with CGA for balance  Pt performed standing balance/core stabilization exercise - standing unsupported in 4' water depth - pushing bar bells forward in water and then pulling back  toward chest using current of water for resistance and perturbations with balance and for core stabilization  Pt performed amb. in water using buoyancy of water for support for balance and using current of water for perturbations - pt amb. 7m (83') forwards, backwards and sideways 1 rep each direction; ended session by pt amb. 50' forwards amb. With UE support on barbell with min stabilization provided by PT  Pt requires aquatic therapy for use of buoyancy of water for support for balance; current of water provides perturbations for  balance challenges and viscosity of water needed for strengthening                      PT Short Term Goals - 01/24/18 1409      PT SHORT TERM GOAL #1   Title  Pt will be independent with updated HEP exercises (All STGs Target Date 02/24/2018)    Time  1    Period  Months    Status  Revised    Target Date  02/24/18      PT SHORT TERM GOAL #2   Title  Patient ambulates 500' outdoor surfaces including grass with RW with supervision.     Time  1    Period  Months    Status  Revised    Target  Date  02/24/18      PT SHORT TERM GOAL #3   Title  Timed Up & Go with cane <15sec with supervision.     Time  1    Period  Months    Status  On-going    Target Date  02/24/18      PT SHORT TERM GOAL #4   Title  Berg Balance >/= 39/56    Time  1    Period  Months    Status  Revised    Target Date  02/24/18      PT SHORT TERM GOAL #5   Title  Patient ambulates 100' around furniture with cane with supervision.     Time  1    Period  Months    Status  New    Target Date  02/24/18        PT Long Term Goals - 01/25/18 1417      PT LONG TERM GOAL #1   Title  Pt will demonstrate independence with ongoing HEP program and fitness plan including returning to aquatic class at [facility] (All LTGs Target Date 04/21/2018)    Time  3    Period  Months    Status  On-going    Target Date  04/21/18      PT LONG TERM GOAL #2   Title  Patient ambulates Modified Independent 500' outdoors including grass, pavement, curbs, ramps with LRAD to improve community ambulatory skills    Time  3    Period  Months    Status  Revised    Target Date  04/21/18      PT LONG TERM GOAL #3   Title  Berg Balance >/= 45/56 to indicate lower fall risk.     Time  3    Period  Months    Status  On-going    Target Date  04/21/18      PT LONG TERM GOAL #4   Title  Timed Up-Go time with cane <13.5 seconds to indicate lower fall risk.     Time  3    Period  Months    Status  On-going     Target Date  04/21/18      PT LONG TERM GOAL #5   Title  Patient ambulates household activities including negotiating furniture, scanning & carrying light items with cane modified independent.     Time  3    Period  Months    Status  New    Target Date  04/21/18            Plan - 02/21/18 1004    Clinical Impression Statement  Pt tolerated aquatic therapy session well with minimal c/o fatigue and no c/o dizziness during session;  pt had most difficulty with sidestepping toward right side with pt having min to mod LOB with min assist required for recovery - pt was using large single barbell for UE support with mod assist for stabilization provided by PT.      Rehab Potential  Good    Clinical Impairments Affecting Rehab Potential  progressive decline over 2 years, good attitude and family support with 4 children in area.     PT Frequency  2x / week    PT Duration  12 weeks    PT Treatment/Interventions  Gait training;Functional mobility training;Neuromuscular re-education;Balance training;Therapeutic exercise;Therapeutic activities;Patient/family education;ADLs/Self Care Home Management;Stair training;Orthotic Fit/Training;Manual techniques;Vestibular;Canalith Repostioning;Aquatic Therapy    PT Next Visit Plan  continue to address  posture, strengthening and gait with cane     Consulted and Agree with Plan of Care  Patient    Family Member Consulted  daughter       Patient will benefit from skilled therapeutic intervention in order to improve the following deficits and impairments:  Abnormal gait, Decreased activity tolerance, Decreased balance, Decreased endurance, Decreased knowledge of use of DME, Decreased mobility, Decreased range of motion, Decreased safety awareness, Decreased strength, Impaired flexibility, Postural dysfunction, Dizziness  Visit Diagnosis: Other abnormalities of gait and mobility  Unsteadiness on feet  Muscle weakness (generalized)     Problem  List Patient Active Problem List   Diagnosis Date Noted  . Bradycardia 09/16/2017  . CKD (chronic kidney disease), stage III ([place]) 09/15/2017  . Near syncope 09/14/2017  . Post-operative pain   . Dementia without behavioral disturbance ([place])   . TIA (transient ischemic attack)   . Reactive hypertension   . Bimalleolar fracture of left ankle 05/03/2017  . Axonal neuropathy 03/21/2017  . Right sided weakness 03/01/2017  . Cigarette smoker 06/16/2016  . Dyspnea on exertion 06/15/2016  . Alzheimer disease ([place]) 01/07/2014  . CSA (central sleep apnea) 10/05/2013  . Syncope 06/26/2013  . COPD GOLD II 06/25/2013  . BPH (benign prostatic hyperplasia) 05/18/2013  . Glaucoma 05/18/2013  . S/P right TH revision 05/14/2013  . Constipation 03/26/2013  . Osteoporosis 03/26/2013  . S/P right THA, AA 03/20/2013  . Hyperlipidemia   . Erectile dysfunction   . History of asbestos exposure   . Nocturia   . History of shingles   . Dermatitis, atopic   . OA (osteoarthritis) of hip   . Arthritis   . Frequency of urination   . Lumbar scoliosis   . Malignant neoplasm of prostate ([place]) 06/07/2011  . 85 year old gentleman with stage T3 adenocarcinoma prostate with Gleason score 4+4 and PSA of 10.3 05/11/2011    Raiya Stainback, [place], PT 02/21/2018, 10:09 AM  [facility] [address] [place], [place], [address] Phone: [phone]   Fax:  [phone]  Name: Derek Carr MRN: [id number] Date of Birth: [birth year]

## 2018-02-21 NOTE — Telephone Encounter (Signed)
Patients daughter [name] calling because of confusion about last message regarding her Dads Topamax. She states that [place] asked her to call her after he takes medication for 2 weeks & then to call if no improvement. She is calling to let her know that he has had no improvement. Please call her if any medication changes are needed.

## 2018-02-21 NOTE — Telephone Encounter (Signed)
Spoke with Derek Carr and gave her Jessica's instructions, she verbalized understanding. No other questions or concern at this time.

## 2018-02-22 DIAGNOSIS — Z8546 Personal history of malignant neoplasm of prostate: Secondary | ICD-10-CM | POA: Diagnosis not present

## 2018-02-24 ENCOUNTER — Ambulatory Visit: Payer: Medicare HMO | Admitting: Adult Health

## 2018-02-24 ENCOUNTER — Ambulatory Visit: Payer: Medicare HMO

## 2018-02-24 DIAGNOSIS — M6281 Muscle weakness (generalized): Secondary | ICD-10-CM

## 2018-02-24 DIAGNOSIS — R42 Dizziness and giddiness: Secondary | ICD-10-CM | POA: Diagnosis not present

## 2018-02-24 DIAGNOSIS — R2689 Other abnormalities of gait and mobility: Secondary | ICD-10-CM | POA: Diagnosis not present

## 2018-02-24 DIAGNOSIS — R2681 Unsteadiness on feet: Secondary | ICD-10-CM | POA: Diagnosis not present

## 2018-02-24 DIAGNOSIS — M25672 Stiffness of left ankle, not elsewhere classified: Secondary | ICD-10-CM | POA: Diagnosis not present

## 2018-02-24 DIAGNOSIS — R41841 Cognitive communication deficit: Secondary | ICD-10-CM

## 2018-02-24 DIAGNOSIS — R278 Other lack of coordination: Secondary | ICD-10-CM | POA: Diagnosis not present

## 2018-02-24 NOTE — Therapy (Signed)
[facility] [address] [place], [place], [address] Phone: [phone]   Fax:  [phone]  Physical Therapy Treatment  Patient Details  Name: Derek Carr MRN: [id number] Date of Birth: 02/03/1931 Referring Provider (PT): Mechele Claude, [place]   Encounter Date: 02/24/2018  PT End of Session - 02/24/18 1202    Visit Number  45    Number of Visits  43    Date for PT Re-Evaluation  04/24/18    Authorization Type  [employer]na Medicare & Generic commercial 2nd    Authorization Time Period  Progress Note due visit 44    PT Start Time  1101    PT Stop Time  1149    PT Time Calculation (min)  48 min    Equipment Utilized During Treatment  Other (comment);Gait belt   noodle & bar bell used for UE support   Activity Tolerance  Patient tolerated treatment well    Behavior During Therapy  WFL for tasks assessed/performed       Past Medical History:  Diagnosis Date  . AAA (abdominal aortic aneurysm) ([place])   . Anemia   . Ankle fracture    left  . Aortic regurgitation   . Atherosclerosis   . Complication of anesthesia    CONFUSION - Pt's family very concerned about this  . DDD (degenerative disc disease)   . Degenerative arthritis   . Dermatitis, atopic ARMS AND LEGS  . Diverticulosis   . Erectile dysfunction   . Frequency of urination   . Glaucoma   . History of asbestos exposure   . History of radiation therapy 8/19-13-10/18/11   prostate, 74 GY  . History of shingles 2012-- BILATERAL EYES--  NO RESIDUAL  . Hyperlipidemia   . Hypertension   . Inguinal hernia   . Lumbar scoliosis   . Nocturia   . OA (osteoarthritis) of hip RIGHT  . Prostate cancer ([place]) 05/11/2011   bx=Adenocarcinoma,gleason3+4=7, 4+4=8,PSA=10.30volume=45.7cc  . Renal cyst    bilateral  . Smokers' cough ([place])   . Stroke ([place])   . Thyroid cyst     Past Surgical History:  Procedure Laterality Date  . ATTEMPTED LEFT VATS/ LEFT THORACOTOMY/  RESECTION OF THE ENORMOUS, PROBABLE BRONCHOGENIC CYST  01-04-2005  DR Arlyce Dice  . CARDIAC CATHETERIZATION  02-24-2009  DR NASHER   MINOR CORONARY ARTERY IRREGULARITIES/ NORMAL LVSF/ EF 65-70%  . CATARACT EXTRACTION    . COLONOSCOPY W/ POLYPECTOMY    . CYSTOSCOPY  11/15/2011   Procedure: CYSTOSCOPY;  Surgeon: Dutch Gray, MD;  Location: [facility];  Service: Urology;  Laterality: N/A;  no seeds seen in bladder  . esophageus cyst removal  YRS AGO  . [place]  . ORIF ANKLE FRACTURE Left 05/03/2017  . ORIF ANKLE FRACTURE Left 05/03/2017   Procedure: OPEN REDUCTION INTERNAL FIXATION (ORIF) BIMALLEOLAR  ANKLE FRACTURE;  Surgeon: Wylene Simmer, MD;  Location: [facility];  Service: Orthopedics;  Laterality: Left;  . PACEMAKER IMPLANT N/A 09/16/2017   Procedure: PACEMAKER IMPLANT;  Surgeon: Constance Haw, MD;  Location: [facility];  Service: Cardiovascular;  Laterality: N/A;  . PROSTATE BIOPSY  05/11/11   Adenocarcinoma ([facility] OFFICE)  . RADIOACTIVE SEED IMPLANT  11/15/2011   Procedure: RADIOACTIVE SEED IMPLANT;  Surgeon: Dutch Gray, MD;  Location: [facility];  Service: Urology;  Laterality: N/A;  Total number of seeds - 52  . REPAIR RIGHT INGUINAL HERNIA W/ MESH  09-10-1999  .  TOTAL HIP ARTHROPLASTY Right 03/20/2013   Procedure: RIGHT TOTAL HIP ARTHROPLASTY ANTERIOR APPROACH;  Surgeon: [name], MD;  [facility];  Service: Orthopedics;  Laterality: Right;  . TOTAL HIP REVISION Right 05/14/2013   Procedure: open reduction internal fixation REVISION RIGHT HIP ;  Surgeon: [name], MD;  [facility];  Service: Orthopedics;  Laterality: Right;  . UMBILICAL HERNIA REPAIR  1998   epigastric    There were no vitals filed for this visit.  Subjective Assessment - 02/24/18 1105    Subjective  Pt states he enjoyed aquatic therapy and would attend again, No falls to report, HEP/walking is going well.     Patient is accompained by:  Family  member    Pertinent History  left ankle fracture, TIAs, DDD, Lumbar scoliosis, Glaucoma, cataract surgery, HTN, right THR 03/20/13 with revision 05/14/13, right femur fracture, dementia/Alzheimers    Limitations  Standing;Walking;House hold activities    Patient Stated Goals  Patient wants to improve his balance & walking    Currently in Pain?  No/denies         [facility] Adult PT Treatment/Exercise - 02/24/18 0001      Ambulation/Gait   Ambulation/Gait  Yes    Ambulation/Gait Assistance  5: Supervision;4: Min guard    Ambulation/Gait Assistance Details  Pt ambulated outdoors while using RW with Supervision for assistance, pt presented with fatigue/foot drag towards end of outdoor walk.     Ambulation Distance (Feet)  500 Feet   230   Assistive device  Rolling walker;Straight cane;Other (Comment)   with rubber quad tip   Gait Pattern  Step-through pattern;Decreased stance time - left;Decreased step length - right;Decreased weight shift to left;Left hip hike;Antalgic;Lateral hip instability;Trunk flexed    Ambulation Surface  Level;Unlevel;Indoor;Outdoor;Paved;Grass    Gait Comments  Pt ambulated indoors with quad tipped cane with min guard/supervision for safety with min instability while negotiating around obstacles.              PT Education - 02/24/18 1244    Education provided  Yes    Education Details  Educated daughter on pts current status, potential D/C, benefits of aquatic therapy, goals, etc.    Person(s) Educated  Child(ren)   Daughter   Methods  Explanation    Comprehension  Verbalized understanding;Need further instruction       PT Short Term Goals - 02/24/18 1111      PT SHORT TERM GOAL #1   Title  Pt will be independent with updated HEP exercises (All STGs Target Date 02/24/2018)    Baseline  1/31: Pt is independent with updated HEP.     Time  1    Period  Months    Status  Achieved      PT SHORT TERM GOAL #2   Title  Patient ambulates 500' outdoor surfaces  including grass with RW with supervision.     Baseline  1/31: Pt ambulates 500' outdoor surfaces with Supervision.     Time  1    Period  Months    Status  Achieved      PT SHORT TERM GOAL #3   Title  Timed Up & Go with cane <15sec with supervision.     Time  1    Period  Months    Status  On-going      PT SHORT TERM GOAL #4   Title  Berg Balance >/= 39/56    Time  1    Period  Months    Status  Revised      PT SHORT TERM GOAL #5   Title  Patient ambulates 100' around furniture with cane with supervision.     Baseline  1/31: Pt ambulated 100' around furniture with cane, Min guard/Supervision for safety.     Time  1    Period  Months    Status  Partially Met        PT Long Term Goals - 01/25/18 1417      PT LONG TERM GOAL #1   Title  Pt will demonstrate independence with ongoing HEP program and fitness plan including returning to aquatic class at [facility] (All LTGs Target Date 04/21/2018)    Time  3    Period  Months    Status  On-going    Target Date  04/21/18      PT LONG TERM GOAL #2   Title  Patient ambulates Modified Independent 500' outdoors including grass, pavement, curbs, ramps with LRAD to improve community ambulatory skills    Time  3    Period  Months    Status  Revised    Target Date  04/21/18      PT LONG TERM GOAL #3   Title  Berg Balance >/= 45/56 to indicate lower fall risk.     Time  3    Period  Months    Status  On-going    Target Date  04/21/18      PT LONG TERM GOAL #4   Title  Timed Up-Go time with cane <13.5 seconds to indicate lower fall risk.     Time  3    Period  Months    Status  On-going    Target Date  04/21/18      PT LONG TERM GOAL #5   Title  Patient ambulates household activities including negotiating furniture, scanning & carrying light items with cane modified independent.     Time  3    Period  Months    Status  New    Target Date  04/21/18            Plan - 02/24/18 1247    Clinical Impression Statement   Todays skilled session focused on assessing STG's with 2 met, 1 partially met due to level of assistance required, and pt/family education. Pt is interested in continuing with aquatic therapy but is unsure of OP visits.   (Pended)     Rehab Potential  Good  (Pended)     Clinical Impairments Affecting Rehab Potential  progressive decline over 2 years, good attitude and family support with 4 children in area.   (Pended)     PT Frequency  2x / week  (Pended)     PT Duration  12 weeks  (Pended)     PT Treatment/Interventions  Gait training;Functional mobility training;Neuromuscular re-education;Balance training;Therapeutic exercise;Therapeutic activities;Patient/family education;ADLs/Self Care Home Management;Stair training;Orthotic Fit/Training;Manual techniques;Vestibular;Canalith Repostioning;Aquatic Therapy  (Pended)     PT Next Visit Plan  continue to address posture, strengthening and gait with cane   (Pended)     Consulted and Agree with Plan of Care  Patient  (Pended)     Family Member Consulted  daughter  (Pended)        Patient will benefit from skilled therapeutic intervention in order to improve the following deficits and impairments:  (P) Abnormal gait, Decreased activity tolerance, Decreased balance, Decreased endurance, Decreased knowledge of use of DME, Decreased mobility, Decreased range of  motion, Decreased safety awareness, Decreased strength, Impaired flexibility, Postural dysfunction, Dizziness  Visit Diagnosis: Other abnormalities of gait and mobility  Unsteadiness on feet  Muscle weakness (generalized)  Cognitive communication deficit  Other lack of coordination     Problem List Patient Active Problem List   Diagnosis Date Noted  . Bradycardia 09/16/2017  . CKD (chronic kidney disease), stage III ([place]) 09/15/2017  . Near syncope 09/14/2017  . Post-operative pain   . Dementia without behavioral disturbance ([place])   . TIA (transient ischemic attack)   . Reactive  hypertension   . Bimalleolar fracture of left ankle 05/03/2017  . Axonal neuropathy 03/21/2017  . Right sided weakness 03/01/2017  . Cigarette smoker 06/16/2016  . Dyspnea on exertion 06/15/2016  . Alzheimer disease ([place]) 01/07/2014  . CSA (central sleep apnea) 10/05/2013  . Syncope 06/26/2013  . COPD GOLD II 06/25/2013  . BPH (benign prostatic hyperplasia) 05/18/2013  . Glaucoma 05/18/2013  . S/P right TH revision 05/14/2013  . Constipation 03/26/2013  . Osteoporosis 03/26/2013  . S/P right THA, AA 03/20/2013  . Hyperlipidemia   . Erectile dysfunction   . History of asbestos exposure   . Nocturia   . History of shingles   . Dermatitis, atopic   . OA (osteoarthritis) of hip   . Arthritis   . Frequency of urination   . Lumbar scoliosis   . Malignant neoplasm of prostate ([place]) 06/07/2011  . 84 year old gentleman with stage T3 adenocarcinoma prostate with Gleason score 4+4 and PSA of 10.3 05/11/2011    , PTA   A  02/24/2018, 1:25 PM  [facility] [address] [place], [place], [address] Phone: [phone]   Fax:  [phone]  Name: Derek Carr MRN: [id number] Date of Birth: 16-May-1931

## 2018-02-27 ENCOUNTER — Ambulatory Visit: Payer: Medicare Other | Attending: Internal Medicine | Admitting: Physical Therapy

## 2018-02-27 DIAGNOSIS — M6281 Muscle weakness (generalized): Secondary | ICD-10-CM | POA: Diagnosis not present

## 2018-02-27 DIAGNOSIS — R2681 Unsteadiness on feet: Secondary | ICD-10-CM | POA: Diagnosis not present

## 2018-02-27 DIAGNOSIS — R2689 Other abnormalities of gait and mobility: Secondary | ICD-10-CM | POA: Diagnosis not present

## 2018-02-28 ENCOUNTER — Ambulatory Visit: Payer: Medicare HMO | Admitting: Physical Therapy

## 2018-02-28 NOTE — Therapy (Signed)
[facility] [address] [place], [place], [address] Phone: [phone]   Fax:  [phone]  Physical Therapy Treatment  Patient Details  Name: Derek Carr MRN: [id number] Date of Birth: [birth year] Referring Provider (PT): Mechele Claude, [place]   Encounter Date: 02/27/2018  PT End of Session - 02/28/18 2037    Visit Number  46    Number of Visits  39    Date for PT Re-Evaluation  04/24/18    Authorization Type  [employer] & Generic commercial 2nd    Authorization Time Period  Progress Note due visit 60    PT Start Time  1500    PT Stop Time  1545    PT Time Calculation (min)  45 min    Equipment Utilized During Treatment  Other (comment)   water noodle used for support   Activity Tolerance  Patient tolerated treatment well    Behavior During Therapy  [facility] for tasks assessed/performed       Past Medical History:  Diagnosis Date  . AAA (abdominal aortic aneurysm) ([place])   . Anemia   . Ankle fracture    left  . Aortic regurgitation   . Atherosclerosis   . Complication of anesthesia    CONFUSION - Pt's family very concerned about this  . DDD (degenerative disc disease)   . Degenerative arthritis   . Dermatitis, atopic ARMS AND LEGS  . Diverticulosis   . Erectile dysfunction   . Frequency of urination   . Glaucoma   . History of asbestos exposure   . History of radiation therapy 8/19-13-10/18/11   prostate, 40 GY  . History of shingles 2012-- BILATERAL EYES--  NO RESIDUAL  . Hyperlipidemia   . Hypertension   . Inguinal hernia   . Lumbar scoliosis   . Nocturia   . OA (osteoarthritis) of hip RIGHT  . Prostate cancer ([place]) 05/11/2011   bx=Adenocarcinoma,gleason3+4=7, 4+4=8,PSA=10.30volume=45.7cc  . Renal cyst    bilateral  . Smokers' cough ([place])   . Stroke ([place])   . Thyroid cyst     Past Surgical History:  Procedure Laterality Date  . ATTEMPTED LEFT VATS/ LEFT THORACOTOMY/ RESECTION OF THE ENORMOUS,  PROBABLE BRONCHOGENIC CYST  01-04-2005  DR Arlyce Dice  . CARDIAC CATHETERIZATION  02-24-2009  DR NASHER   MINOR CORONARY ARTERY IRREGULARITIES/ NORMAL LVSF/ EF 65-70%  . CATARACT EXTRACTION    . COLONOSCOPY W/ POLYPECTOMY    . CYSTOSCOPY  11/15/2011   Procedure: CYSTOSCOPY;  Surgeon: Dutch Gray, MD;  Location: [facility];  Service: Urology;  Laterality: N/A;  no seeds seen in bladder  . esophageus cyst removal  YRS AGO  . [place]  . ORIF ANKLE FRACTURE Left 05/03/2017  . ORIF ANKLE FRACTURE Left 05/03/2017   Procedure: OPEN REDUCTION INTERNAL FIXATION (ORIF) BIMALLEOLAR  ANKLE FRACTURE;  Surgeon: Wylene Simmer, MD;  Location: [facility];  Service: Orthopedics;  Laterality: Left;  . PACEMAKER IMPLANT N/A 09/16/2017   Procedure: PACEMAKER IMPLANT;  Surgeon: Constance Haw, MD;  Location: [facility];  Service: Cardiovascular;  Laterality: N/A;  . PROSTATE BIOPSY  05/11/11   Adenocarcinoma ([facility])  . RADIOACTIVE SEED IMPLANT  11/15/2011   Procedure: RADIOACTIVE SEED IMPLANT;  Surgeon: Dutch Gray, MD;  Location: [facility];  Service: Urology;  Laterality: N/A;  Total number of seeds - 52  . REPAIR RIGHT INGUINAL HERNIA W/ MESH  09-10-1999  . TOTAL HIP ARTHROPLASTY Right  03/20/2013   Procedure: RIGHT TOTAL HIP ARTHROPLASTY ANTERIOR APPROACH;  Surgeon: [name], MD;  Location: [facility];  Service: Orthopedics;  Laterality: Right;  . TOTAL HIP REVISION Right 05/14/2013   Procedure: open reduction internal fixation REVISION RIGHT HIP ;  Surgeon: [name], MD;  Location: [facility];  Service: Orthopedics;  Laterality: Right;  . UMBILICAL HERNIA REPAIR  1998   epigastric    There were no vitals filed for this visit.  Subjective Assessment - 02/28/18 2035    Subjective  Pt's daughter [name] states pt is dragging today - states "he is not moving well today";  Pt presents for aquatic therapy session at [facility]     Patient is accompained by:   Family member    Pertinent History  left ankle fracture, TIAs, DDD, Lumbar scoliosis, Glaucoma, cataract surgery, HTN, right THR 03/20/13 with revision 05/14/13, right femur fracture, dementia/Alzheimers    Patient Stated Goals  Patient wants to improve his balance & walking    Currently in Pain?  No/denies              Aquatic therapy - pool temp 87.4 degrees  Patient seen for aquatic therapy today.  Treatment took place in water 3.5-4 feet deep depending upon activity.  Pt entered and exited  the pool via step negotiation with use of bil. Hand rails using a step by step sequence with CGA for safety.  Pt performed standing hip strengthening exercises bil. LE's - hip flexion, extension, abduction 10 reps each exercise on each leg Squats x 10 reps with pt holding onto side of pool:  Heel raises 10 reps with UE support Standing marching -10 reps each leg with UE support on PT's shoulders Marching approx. 25' across pool with mod UE support on noodle  Pt rode stationery bike in water for approx. 8" for LE strengthening and endurance training - using viscosity and current of water for resistance for strengthening Pt able to climb onto seat with only CGA - able to get off of bike with mod assist  Pt performed amb. in water using buoyancy of water for support for balance and using current of water for perturbations -  pt amb. 3m(83') forwards x 2 reps with UE support on noodle (stabilized by PT)  Pt requires aquatic therapy for use of buoyancy of water for support for balance; current of water provides perturbations for balance challenges and viscosity of water needed for strengthening                               PT Short Term Goals - 02/24/18 1111      PT SHORT TERM GOAL #1   Title  Pt will be independent with updated HEP exercises (All STGs Target Date 02/24/2018)    Baseline  1/31: Pt is independent with updated HEP.     Time  1    Period  Months     Status  Achieved      PT SHORT TERM GOAL #2   Title  Patient ambulates 500' outdoor surfaces including grass with RW with supervision.     Baseline  1/31: Pt ambulates 500' outdoor surfaces with Supervision.     Time  1    Period  Months    Status  Achieved      PT SHORT TERM GOAL #3   Title  Timed Up & Go with cane <15sec with supervision.  Time  1    Period  Months    Status  On-going      PT SHORT TERM GOAL #4   Title  Berg Balance >/= 39/56    Time  1    Period  Months    Status  Revised      PT SHORT TERM GOAL #5   Title  Patient ambulates 100' around furniture with cane with supervision.     Baseline  1/31: Pt ambulated 100' around furniture with cane, Min guard/Supervision for safety.     Time  1    Period  Months    Status  Partially Met        PT Long Term Goals - 01/25/18 1417      PT LONG TERM GOAL #1   Title  Pt will demonstrate independence with ongoing HEP program and fitness plan including returning to aquatic class at [facility] (All LTGs Target Date 04/21/2018)    Time  3    Period  Months    Status  On-going    Target Date  04/21/18      PT LONG TERM GOAL #2   Title  Patient ambulates Modified Independent 500' outdoors including grass, pavement, curbs, ramps with LRAD to improve community ambulatory skills    Time  3    Period  Months    Status  Revised    Target Date  04/21/18      PT LONG TERM GOAL #3   Title  Berg Balance >/= 45/56 to indicate lower fall risk.     Time  3    Period  Months    Status  On-going    Target Date  04/21/18      PT LONG TERM GOAL #4   Title  Timed Up-Go time with cane <13.5 seconds to indicate lower fall risk.     Time  3    Period  Months    Status  On-going    Target Date  04/21/18      PT LONG TERM GOAL #5   Title  Patient ambulates household activities including negotiating furniture, scanning & carrying light items with cane modified independent.     Time  3    Period  Months    Status  New     Target Date  04/21/18            Plan - 02/28/18 2038    Clinical Impression Statement  Aquatic therapy session focused on standing balance, water walking with UE support prn to challenge dynamic balance and on LE strengthening.  Pt able to climb onto seat of stationary bike in the water and pedal bike for approx. 8" without c/o fatigue.  Pt required min assist with water walking due to current of water providing perturbations with balance.      Rehab Potential  Good    Clinical Impairments Affecting Rehab Potential  progressive decline over 2 years, good attitude and family support with 4 children in area.     PT Frequency  2x / week    PT Duration  12 weeks    PT Treatment/Interventions  Gait training;Functional mobility training;Neuromuscular re-education;Balance training;Therapeutic exercise;Therapeutic activities;Patient/family education;ADLs/Self Care Home Management;Stair training;Orthotic Fit/Training;Manual techniques;Vestibular;Canalith Repostioning;Aquatic Therapy    PT Next Visit Plan  10th visit progress note due per chart note stating due at visit 47 (?):  cont balance and gait training    [place]  Consulted and Agree with Plan of Care  Patient    Family Member Consulted  daughter       Patient will benefit from skilled therapeutic intervention in order to improve the following deficits and impairments:  Abnormal gait, Decreased activity tolerance, Decreased balance, Decreased endurance, Decreased knowledge of use of DME, Decreased mobility, Decreased range of motion, Decreased safety awareness, Decreased strength, Impaired flexibility, Postural dysfunction, Dizziness  Visit Diagnosis: Other abnormalities of gait and mobility  Unsteadiness on feet  Muscle weakness (generalized)     Problem List Patient Active Problem List   Diagnosis Date Noted  . Bradycardia 09/16/2017  . CKD (chronic kidney disease), stage III ([place]) 09/15/2017  . Near  syncope 09/14/2017  . Post-operative pain   . Dementia without behavioral disturbance ([place])   . TIA (transient ischemic attack)   . Reactive hypertension   . Bimalleolar fracture of left ankle 05/03/2017  . Axonal neuropathy 03/21/2017  . Right sided weakness 03/01/2017  . Cigarette smoker 06/16/2016  . Dyspnea on exertion 06/15/2016  . Alzheimer disease ([place]) 01/07/2014  . CSA (central sleep apnea) 10/05/2013  . Syncope 06/26/2013  . COPD GOLD II 06/25/2013  . BPH (benign prostatic hyperplasia) 05/18/2013  . Glaucoma 05/18/2013  . S/P right TH revision 05/14/2013  . Constipation 03/26/2013  . Osteoporosis 03/26/2013  . S/P right THA, AA 03/20/2013  . Hyperlipidemia   . Erectile dysfunction   . History of asbestos exposure   . Nocturia   . History of shingles   . Dermatitis, atopic   . OA (osteoarthritis) of hip   . Arthritis   . Frequency of urination   . Lumbar scoliosis   . Malignant neoplasm of prostate ([place]) 06/07/2011  . 85 year old gentleman with stage T3 adenocarcinoma prostate with Gleason score 4+4 and PSA of 10.3 05/11/2011    Khelani Kops, [place], PT 02/28/2018, 8:45 PM  [facility] [address] [place], [place], [address] Phone: [phone]   Fax:  [phone]  Name: Derek Carr MRN: [id number] Date of Birth: [birth year]

## 2018-03-01 DIAGNOSIS — N3941 Urge incontinence: Secondary | ICD-10-CM | POA: Diagnosis not present

## 2018-03-01 DIAGNOSIS — N401 Enlarged prostate with lower urinary tract symptoms: Secondary | ICD-10-CM | POA: Diagnosis not present

## 2018-03-01 DIAGNOSIS — Z8546 Personal history of malignant neoplasm of prostate: Secondary | ICD-10-CM | POA: Diagnosis not present

## 2018-03-02 ENCOUNTER — Encounter: Payer: Self-pay | Admitting: Physical Therapy

## 2018-03-02 ENCOUNTER — Ambulatory Visit: Payer: Medicare Other | Admitting: Physical Therapy

## 2018-03-02 DIAGNOSIS — R2689 Other abnormalities of gait and mobility: Secondary | ICD-10-CM

## 2018-03-02 DIAGNOSIS — M6281 Muscle weakness (generalized): Secondary | ICD-10-CM | POA: Diagnosis not present

## 2018-03-02 DIAGNOSIS — R2681 Unsteadiness on feet: Secondary | ICD-10-CM

## 2018-03-03 ENCOUNTER — Ambulatory Visit: Payer: Medicare HMO | Admitting: Rehabilitation

## 2018-03-03 DIAGNOSIS — M4125 Other idiopathic scoliosis, thoracolumbar region: Secondary | ICD-10-CM | POA: Diagnosis not present

## 2018-03-03 DIAGNOSIS — R262 Difficulty in walking, not elsewhere classified: Secondary | ICD-10-CM | POA: Diagnosis not present

## 2018-03-03 NOTE — Therapy (Addendum)
[facility] [address] [place], [place], [address] Phone: [phone]   Fax:  [phone]  Physical Therapy Treatment & Progress Note  Patient Details  Name: Derek Carr MRN: [id number] Date of Birth: 05/09/1931 Referring Provider (PT): Mechele Claude, [place]   Progress Note Reporting Period 01/31/2018 to 03/02/2018  See note below for Objective Data and Assessment of Progress/Goals.   Jamey Reas, PT, DPT PT Specializing in [place] 03/09/18 7:00 AM Phone:  [phone]  Fax:  [phone] [place] [address] [place], [place] [address]    Encounter Date: 03/02/2018  PT End of Session - 03/02/18 1320    Visit Number  18    Number of Visits  47    Date for PT Re-Evaluation  04/24/18    Authorization Type  [employer] & Generic commercial 2nd    Authorization Time Period  Progress Note due visit 79    PT Start Time  1318    PT Stop Time  1400    PT Time Calculation (min)  42 min    Equipment Utilized During Treatment  Gait belt    Activity Tolerance  Patient tolerated treatment well    Behavior During Therapy  WFL for tasks assessed/performed       Past Medical History:  Diagnosis Date  . AAA (abdominal aortic aneurysm) ([place])   . Anemia   . Ankle fracture    left  . Aortic regurgitation   . Atherosclerosis   . Complication of anesthesia    CONFUSION - Pt's family very concerned about this  . DDD (degenerative disc disease)   . Degenerative arthritis   . Dermatitis, atopic ARMS AND LEGS  . Diverticulosis   . Erectile dysfunction   . Frequency of urination   . Glaucoma   . History of asbestos exposure   . History of radiation therapy 8/19-13-10/18/11   prostate, 8 GY  . History of shingles 2012-- BILATERAL EYES--  NO RESIDUAL  . Hyperlipidemia   . Hypertension   . Inguinal hernia   . Lumbar scoliosis   . Nocturia   . OA (osteoarthritis) of hip  RIGHT  . Prostate cancer ([place]) 05/11/2011   bx=Adenocarcinoma,gleason3+4=7, 4+4=8,PSA=10.30volume=45.7cc  . Renal cyst    bilateral  . Smokers' cough ([place])   . Stroke ([place])   . Thyroid cyst     Past Surgical History:  Procedure Laterality Date  . ATTEMPTED LEFT VATS/ LEFT THORACOTOMY/ RESECTION OF THE ENORMOUS, PROBABLE BRONCHOGENIC CYST  01-04-2005  DR Arlyce Dice  . CARDIAC CATHETERIZATION  02-24-2009  DR NASHER   MINOR CORONARY ARTERY IRREGULARITIES/ NORMAL LVSF/ EF 65-70%  . CATARACT EXTRACTION    . COLONOSCOPY W/ POLYPECTOMY    . CYSTOSCOPY  11/15/2011   Procedure: CYSTOSCOPY;  Surgeon: Dutch Gray, MD;  Location: [facility];  Service: Urology;  Laterality: N/A;  no seeds seen in bladder  . esophageus cyst removal  YRS AGO  . [place]  . ORIF ANKLE FRACTURE Left 05/03/2017  . ORIF ANKLE FRACTURE Left 05/03/2017   Procedure: OPEN REDUCTION INTERNAL FIXATION (ORIF) BIMALLEOLAR  ANKLE FRACTURE;  Surgeon: Wylene Simmer, MD;  Location: [facility];  Service: Orthopedics;  Laterality: Left;  . PACEMAKER IMPLANT N/A 09/16/2017   Procedure: PACEMAKER IMPLANT;  Surgeon: Constance Haw, MD;  Location: [facility];  Service: Cardiovascular;  Laterality: N/A;  . PROSTATE BIOPSY  05/11/11   Adenocarcinoma ([facility])  .  RADIOACTIVE SEED IMPLANT  11/15/2011   Procedure: RADIOACTIVE SEED IMPLANT;  Surgeon: Dutch Gray, MD;  Location: [facility];  Service: Urology;  Laterality: N/A;  Total number of seeds - 52  . REPAIR RIGHT INGUINAL HERNIA W/ MESH  09-10-1999  . TOTAL HIP ARTHROPLASTY Right 03/20/2013   Procedure: RIGHT TOTAL HIP ARTHROPLASTY ANTERIOR APPROACH;  Surgeon: [name], MD;  Location: [facility];  Service: Orthopedics;  Laterality: Right;  . TOTAL HIP REVISION Right 05/14/2013   Procedure: open reduction internal fixation REVISION RIGHT HIP ;  Surgeon: [name], MD;  Location: [facility];  Service: Orthopedics;  Laterality: Right;  .  UMBILICAL HERNIA REPAIR  1998   epigastric    There were no vitals filed for this visit.  Subjective Assessment - 03/02/18 1319    Subjective  No falls. No pain to report. Liked the pool on Monday.     Patient is accompained by:  Family member   daughter [name]   Pertinent History  left ankle fracture, TIAs, DDD, Lumbar scoliosis, Glaucoma, cataract surgery, HTN, right THR 03/20/13 with revision 05/14/13, right femur fracture, dementia/Alzheimers    Limitations  Standing;Walking;House hold activities    Patient Stated Goals  Patient wants to improve his balance & walking    Currently in Pain?  No/denies    Pain Score  0-No pain         03/02/18 1328  Transfers  Transfers Sit to Stand;Stand to Sit  Sit to Stand 5: Supervision;With upper extremity assist;From bed;From chair/3-in-1  Stand to Sit 5: Supervision;With upper extremity assist;With armrests;To chair/3-in-1;To bed  Ambulation/Gait  Ambulation/Gait Yes  Ambulation/Gait Assistance 5: Supervision  Ambulation/Gait Assistance Details cues needed for posture, to not shuffle and for step length/height with gait.   Ambulation Distance (Feet)  (around gym with activities)  Assistive device Rolling walker  Gait Pattern Step-through pattern;Decreased stance time - left;Decreased step length - right;Decreased weight shift to left;Left hip hike;Antalgic;Lateral hip instability;Trunk flexed  Ambulation Surface Level;Indoor  High Level Balance  High Level Balance Activities Side stepping;Tandem walking;Marching forwards;Marching backwards (tandem fwd/bwd)  High Level Balance Comments on blue mat in parallel bars: 3 laps each with cues on correct form/technique, light UE support on bars for balance with min guard assist  Self-Care  Self-Care Other Self-Care Comments  Other Self-Care Comments  discussed current LTGs and plan of care. Pt's daughter was wanting to go to 2 aquatic sessions a week or extend plan of care to go to 2x a week in  pool since pt likes it. Explained that the original plan is for 3-4 total visits in the pool to establish a program for her to do with him in the pool. Pt's daughter also expressed concern over not addressing his endurance and that this is why she feels he can't use the cane right now. Discussed that we were under the impression he was going to [facility]. He is good to go there and use the recumbent bikes to work on enduracne as that is not a skilled need for [place] to address since he is safe to do this with his caregivers. Also discussed that is fwd flexed posture and imbalance are the primary deficits affecting his ability to safely use the cane on his own at this time, along with his occasional foot drag with gait. Discussed that at this time it is anticipated that we will discharge at the end of the current plan of care (end of March), however that  is still weeks away.                          Knee/Hip Exercises: Aerobic  Other Aerobic Scifit level 2.0 for 8 minutes with UE/LE's with goal >/= 60 rpm for strengthening and activity tolerance  Knee/Hip Exercises: Machines for Strengthening  Total Gym Leg Press 60# bil Le'S- 10 reps with 5 sec holds, cues for slow, controlled movements         Balance Exercises - 03/02/18 1404      Balance Exercises: Standing   Standing Eyes Closed  Narrow base of support (BOS);Wide (BOA);Head turns;Foam/compliant surface;Other reps (comment);30 secs;Limitations      Balance Exercises: Standing   Standing Eyes Closed Limitations  on airex in parallel bars:  Feet together EC, feet apart EC for head movements. Cues on posture/weight shifting to assist with balance. Min guard to min assist for balance.         PT Short Term Goals - 02/24/18 1111      PT SHORT TERM GOAL #1   Title  Pt will be independent with updated HEP exercises (All STGs Target Date 02/24/2018)    Baseline  1/31: Pt is independent with updated HEP.     Time  1    Period  Months     Status  Achieved      PT SHORT TERM GOAL #2   Title  Patient ambulates 500' outdoor surfaces including grass with RW with supervision.     Baseline  1/31: Pt ambulates 500' outdoor surfaces with Supervision.     Time  1    Period  Months    Status  Achieved      PT SHORT TERM GOAL #3   Title  Timed Up & Go with cane <15sec with supervision.     Time  1    Period  Months    Status  On-going      PT SHORT TERM GOAL #4   Title  Berg Balance >/= 39/56    Time  1    Period  Months    Status  Revised      PT SHORT TERM GOAL #5   Title  Patient ambulates 100' around furniture with cane with supervision.     Baseline  1/31: Pt ambulated 100' around furniture with cane, Min guard/Supervision for safety.     Time  1    Period  Months    Status  Partially Met        PT Long Term Goals - 01/25/18 1417      PT LONG TERM GOAL #1   Title  Pt will demonstrate independence with ongoing HEP program and fitness plan including returning to aquatic class at [facility] (All LTGs Target Date 04/21/2018)    Time  3    Period  Months    Status  On-going    Target Date  04/21/18      PT LONG TERM GOAL #2   Title  Patient ambulates Modified Independent 500' outdoors including grass, pavement, curbs, ramps with LRAD to improve community ambulatory skills    Time  3    Period  Months    Status  Revised    Target Date  04/21/18      PT LONG TERM GOAL #3   Title  Berg Balance >/= 45/56 to indicate lower fall risk.     Time  3    Period  Months    Status  On-going    Target Date  04/21/18      PT LONG TERM GOAL #4   Title  Timed Up-Go time with cane <13.5 seconds to indicate lower fall risk.     Time  3    Period  Months    Status  On-going    Target Date  04/21/18      PT LONG TERM GOAL #5   Title  Patient ambulates household activities including negotiating furniture, scanning & carrying light items with cane modified independent.     Time  3    Period  Months    Status  New     Target Date  04/21/18          03/02/18 1320  Plan  Clinical Impression Statement Today's skilled session intitially addressed current plan of care due to pt's daughters questions/concerns. Then had pt perform seated Scifit and leg press with education on other equipment, along with these that he would be safe to use at the [facility]. Remainder of session addressed balance with no issues reported.  Pt will benefit from skilled therapeutic intervention in order to improve on the following deficits Abnormal gait;Decreased activity tolerance;Decreased balance;Decreased endurance;Decreased knowledge of use of DME;Decreased mobility;Decreased range of motion;Decreased safety awareness;Decreased strength;Impaired flexibility;Postural dysfunction;Dizziness  Rehab Potential Good  Clinical Impairments Affecting Rehab Potential progressive decline over 2 years, good attitude and family support with 4 children in area.   PT Frequency 2x / week  PT Duration 12 weeks  PT Treatment/Interventions Gait training;Functional mobility training;Neuromuscular re-education;Balance training;Therapeutic exercise;Therapeutic activities;Patient/family education;ADLs/Self Care Home Management;Stair training;Orthotic Fit/Training;Manual techniques;Vestibular;Canalith Repostioning;Aquatic Therapy  PT Next Visit Plan cont balance and gait training  PT [place] and Agree with Plan of Care Patient  Family Member Consulted daughter        Patient will benefit from skilled therapeutic intervention in order to improve the following deficits and impairments:  Abnormal gait, Decreased activity tolerance, Decreased balance, Decreased endurance, Decreased knowledge of use of DME, Decreased mobility, Decreased range of motion, Decreased safety awareness, Decreased strength, Impaired flexibility, Postural dysfunction, Dizziness  Visit Diagnosis: Other abnormalities of gait and  mobility  Unsteadiness on feet  Muscle weakness (generalized)     Problem List Patient Active Problem List   Diagnosis Date Noted  . Bradycardia 09/16/2017  . CKD (chronic kidney disease), stage III ([place]) 09/15/2017  . Near syncope 09/14/2017  . Post-operative pain   . Dementia without behavioral disturbance ([place])   . TIA (transient ischemic attack)   . Reactive hypertension   . Bimalleolar fracture of left ankle 05/03/2017  . Axonal neuropathy 03/21/2017  . Right sided weakness 03/01/2017  . Cigarette smoker 06/16/2016  . Dyspnea on exertion 06/15/2016  . Alzheimer disease ([place]) 01/07/2014  . CSA (central sleep apnea) 10/05/2013  . Syncope 06/26/2013  . COPD GOLD II 06/25/2013  . BPH (benign prostatic hyperplasia) 05/18/2013  . Glaucoma 05/18/2013  . S/P right TH revision 05/14/2013  . Constipation 03/26/2013  . Osteoporosis 03/26/2013  . S/P right THA, AA 03/20/2013  . Hyperlipidemia   . Erectile dysfunction   . History of asbestos exposure   . Nocturia   . History of shingles   . Dermatitis, atopic   . OA (osteoarthritis) of hip   . Arthritis   . Frequency of urination   . Lumbar scoliosis   . Malignant neoplasm of prostate ([place]) 06/07/2011  . 85 year old gentleman with stage T3  adenocarcinoma prostate with Gleason score 4+4 and PSA of 10.3 05/11/2011    Lindon Romp, [place], [facility] [address] [place],  [address] [phone] 03/03/18, 7:35 PM   Name: Derek Carr MRN: [id number] Date of Birth: 19-Jan-1932   PT Short Term Goals - 03/09/18 0654      PT SHORT TERM GOAL #1   Title  Pt will be independent with updated HEP exercises (All STGs Target Date 03/24/2018)    Time  1    Period  Months    Status  Revised    Target Date  03/24/18      PT SHORT TERM GOAL #2   Title  Patient and daughter verbalize understanding of community based fitness options including aquatic options.     Time  1     Period  Months    Status  New    Target Date  03/24/18      PT SHORT TERM GOAL #3   Title  Timed Up & Go with cane <15sec with supervision.     Baseline        Time  1    Period  Months    Status  On-going    Target Date  03/24/18      PT SHORT TERM GOAL #4   Title  Berg Balance >/= 42/56    Time  1    Period  Months    Status  Revised    Target Date  03/24/18      PT SHORT TERM GOAL #5   Title  Patient ambulates 100' around furniture carrying cup with cane with supervision.     Time  1    Period  Months    Status  Revised    Target Date  03/24/18      Jamey Reas, PT, DPT PT Specializing in [place] 03/09/18 7:00 AM Phone:  [phone]  Fax:  [phone] [place] [address] [place],  [address]

## 2018-03-06 ENCOUNTER — Ambulatory Visit: Payer: Medicare Other | Admitting: Physical Therapy

## 2018-03-06 DIAGNOSIS — M6281 Muscle weakness (generalized): Secondary | ICD-10-CM | POA: Diagnosis not present

## 2018-03-06 DIAGNOSIS — R2681 Unsteadiness on feet: Secondary | ICD-10-CM | POA: Diagnosis not present

## 2018-03-06 DIAGNOSIS — R2689 Other abnormalities of gait and mobility: Secondary | ICD-10-CM

## 2018-03-07 ENCOUNTER — Ambulatory Visit: Payer: Medicare HMO | Admitting: Physical Therapy

## 2018-03-07 ENCOUNTER — Encounter: Payer: Self-pay | Admitting: Physical Therapy

## 2018-03-07 NOTE — Therapy (Signed)
[facility] [address] [place], [place], [address] Phone: [phone]   Fax:  [phone]  Physical Therapy Treatment  Patient Details  Name: Derek Carr MRN: [id number] Date of Birth: May 05, 1931 Referring Provider (PT): Mechele Claude, [place]   Encounter Date: 03/06/2018  PT End of Session - 03/07/18 2039    Visit Number  48    Number of Visits  44    Date for PT Re-Evaluation  04/24/18    Authorization Type  [employer] Medicare & Generic commercial 2nd    Authorization Time Period  Progress Note due visit 7    PT Start Time  1500    PT Stop Time  1545    PT Time Calculation (min)  45 min       Past Medical History:  Diagnosis Date  . AAA (abdominal aortic aneurysm) ([place])   . Anemia   . Ankle fracture    left  . Aortic regurgitation   . Atherosclerosis   . Complication of anesthesia    CONFUSION - Pt's family very concerned about this  . DDD (degenerative disc disease)   . Degenerative arthritis   . Dermatitis, atopic ARMS AND LEGS  . Diverticulosis   . Erectile dysfunction   . Frequency of urination   . Glaucoma   . History of asbestos exposure   . History of radiation therapy 8/19-13-10/18/11   prostate, 57 GY  . History of shingles 2012-- BILATERAL EYES--  NO RESIDUAL  . Hyperlipidemia   . Hypertension   . Inguinal hernia   . Lumbar scoliosis   . Nocturia   . OA (osteoarthritis) of hip RIGHT  . Prostate cancer ([place]) 05/11/2011   bx=Adenocarcinoma,gleason3+4=7, 4+4=8,PSA=10.30volume=45.7cc  . Renal cyst    bilateral  . Smokers' cough ([place])   . Stroke ([place])   . Thyroid cyst     Past Surgical History:  Procedure Laterality Date  . ATTEMPTED LEFT VATS/ LEFT THORACOTOMY/ RESECTION OF THE ENORMOUS, PROBABLE BRONCHOGENIC CYST  01-04-2005  DR Arlyce Dice  . CARDIAC CATHETERIZATION  02-24-2009  DR NASHER   MINOR CORONARY ARTERY IRREGULARITIES/ NORMAL LVSF/ EF 65-70%  . CATARACT EXTRACTION    . COLONOSCOPY  W/ POLYPECTOMY    . CYSTOSCOPY  11/15/2011   Procedure: CYSTOSCOPY;  Surgeon: Dutch Gray, MD;  Location: [facility];  Service: Urology;  Laterality: N/A;  no seeds seen in bladder  . esophageus cyst removal  YRS AGO  . [place]  . ORIF ANKLE FRACTURE Left 05/03/2017  . ORIF ANKLE FRACTURE Left 05/03/2017   Procedure: OPEN REDUCTION INTERNAL FIXATION (ORIF) BIMALLEOLAR  ANKLE FRACTURE;  Surgeon: Wylene Simmer, MD;  Location: [facility];  Service: Orthopedics;  Laterality: Left;  . PACEMAKER IMPLANT N/A 09/16/2017   Procedure: PACEMAKER IMPLANT;  Surgeon: Constance Haw, MD;  Location: [facility];  Service: Cardiovascular;  Laterality: N/A;  . PROSTATE BIOPSY  05/11/11   Adenocarcinoma (MD OFFICE)  . RADIOACTIVE SEED IMPLANT  11/15/2011   Procedure: RADIOACTIVE SEED IMPLANT;  Surgeon: Dutch Gray, MD;  Location[facility];  Service: Urology;  Laterality: N/A;  Total number of seeds - 52  . REPAIR RIGHT INGUINAL HERNIA W/ MESH  09-10-1999  . TOTAL HIP ARTHROPLASTY Right 03/20/2013   Procedure: RIGHT TOTAL HIP ARTHROPLASTY ANTERIOR APPROACH;  Surgeon: [name], MD;  Location: [facility];  Service: Orthopedics;  Laterality: Right;  . TOTAL HIP REVISION Right 05/14/2013   Procedure: open  reduction internal fixation REVISION RIGHT HIP ;  Surgeon: [name], MD;  Location: [facility];  Service: Orthopedics;  Laterality: Right;  . UMBILICAL HERNIA REPAIR  1998   epigastric    There were no vitals filed for this visit.  Subjective Assessment - 03/07/18 2037    Subjective  Daughter reports pt looks forward to coming to the pool for therapy; pt presents for aquatic therapy at [facility] accompanied by his daughter    Patient is accompained by:  Family member    Pertinent History  left ankle fracture, TIAs, DDD, Lumbar scoliosis, Glaucoma, cataract surgery, HTN, right THR 03/20/13 with revision 05/14/13, right femur fracture, dementia/Alzheimers     Limitations  Standing;Walking;House hold activities    Patient Stated Goals  Patient wants to improve his balance & walking    Currently in Pain?  No/denies            Aquatic therapy - pool temp 87.4 degrees  Patient seen for aquatic therapy today.  Treatment took place in water 3.5-4 feet deep depending upon activity.  Pt entered and exited  the pool via step negotiation with use of bil. Hand rails using a step by step sequence with CGA for safety.  Pt performed standing hip strengthening exercises bil. LE's - hip flexion, extension, abduction 10 reps each exercise on each leg Squats x 10 reps with pt holding onto PT's shoulders for UE support Standing marching -10 reps each leg with UE support on PT's shoulders Marching approx. 25' across pool with mod UE support on noodle  Pt rode stationery bike in water for approx. 15" for LE strengthening and endurance training - using viscosity and current of water for resistance for strengthening Pt able to climb onto seat with min assist - able to get off of bike with min assist  Pt performed amb. in water using buoyancy of water for support for balance  pt amb. 15m(83') forwards x 2 reps with UE support on PT's forearms for stability   Pt requires aquatic therapy for use of buoyancy of water for support for balance; current of water provides perturbations for balance challenges and viscosity of water needed for strengthening                                                   PT Short Term Goals - 02/24/18 1111      PT SHORT TERM GOAL #1   Title  Pt will be independent with updated HEP exercises (All STGs Target Date 02/24/2018)    Baseline  1/31: Pt is independent with updated HEP.     Time  1    Period  Months    Status  Achieved      PT SHORT TERM GOAL #2   Title  Patient ambulates 500' outdoor surfaces including grass with RW with supervision.     Baseline  1/31: Pt ambulates 500'  outdoor surfaces with Supervision.     Time  1    Period  Months    Status  Achieved      PT SHORT TERM GOAL #3   Title  Timed Up & Go with cane <15sec with supervision.     Time  1    Period  Months    Status  On-going      PT SHORT TERM  GOAL #4   Title  Oceanographer >/= 39/56    Time  1    Period  Months    Status  Revised      PT SHORT TERM GOAL #5   Title  Patient ambulates 100' around furniture with cane with supervision.     Baseline  1/31: Pt ambulated 100' around furniture with cane, Min guard/Supervision for safety.     Time  1    Period  Months    Status  Partially Met        PT Long Term Goals - 01/25/18 1417      PT LONG TERM GOAL #1   Title  Pt will demonstrate independence with ongoing HEP program and fitness plan including returning to aquatic class at [facility] (All LTGs Target Date 04/21/2018)    Time  3    Period  Months    Status  On-going    Target Date  04/21/18      PT LONG TERM GOAL #2   Title  Patient ambulates Modified Independent 500' outdoors including grass, pavement, curbs, ramps with LRAD to improve community ambulatory skills    Time  3    Period  Months    Status  Revised    Target Date  04/21/18      PT LONG TERM GOAL #3   Title  Berg Balance >/= 45/56 to indicate lower fall risk.     Time  3    Period  Months    Status  On-going    Target Date  04/21/18      PT LONG TERM GOAL #4   Title  Timed Up-Go time with cane <13.5 seconds to indicate lower fall risk.     Time  3    Period  Months    Status  On-going    Target Date  04/21/18      PT LONG TERM GOAL #5   Title  Patient ambulates household activities including negotiating furniture, scanning & carrying light items with cane modified independent.     Time  3    Period  Months    Status  New    Target Date  04/21/18            Plan - 03/07/18 2040    Clinical Impression Statement  Pt tolerated aquatic exercises well with focus on endurance and strengthening  with pt riding stationary bike in water for approx. 15" with no c/o pain.  Aquatic session also focused on balance and gait training with pt using PT's forearms for UE support to assist with balance.      Rehab Potential  Good    Clinical Impairments Affecting Rehab Potential  progressive decline over 2 years, good attitude and family support with 4 children in area.     PT Frequency  2x / week    PT Duration  12 weeks    PT Treatment/Interventions  Gait training;Functional mobility training;Neuromuscular re-education;Balance training;Therapeutic exercise;Therapeutic activities;Patient/family education;ADLs/Self Care Home Management;Stair training;Orthotic Fit/Training;Manual techniques;Vestibular;Canalith Repostioning;Aquatic Therapy    PT Next Visit Plan  cont balance and gait training    Consulted and Agree with Plan of Care  Patient    Family Member Consulted  daughter       Patient will benefit from skilled therapeutic intervention in order to improve the following deficits and impairments:  Abnormal gait, Decreased activity tolerance, Decreased balance, Decreased endurance, Decreased knowledge of use of DME, Decreased mobility, Decreased range of  motion, Decreased safety awareness, Decreased strength, Impaired flexibility, Postural dysfunction, Dizziness  Visit Diagnosis: Other abnormalities of gait and mobility  Muscle weakness (generalized)  Unsteadiness on feet     Problem List Patient Active Problem List   Diagnosis Date Noted  . Bradycardia 09/16/2017  . CKD (chronic kidney disease), stage III ([place]) 09/15/2017  . Near syncope 09/14/2017  . Post-operative pain   . Dementia without behavioral disturbance ([place])   . TIA (transient ischemic attack)   . Reactive hypertension   . Bimalleolar fracture of left ankle 05/03/2017  . Axonal neuropathy 03/21/2017  . Right sided weakness 03/01/2017  . Cigarette smoker 06/16/2016  . Dyspnea on exertion 06/15/2016  . Alzheimer  disease ([place]) 01/07/2014  . CSA (central sleep apnea) 10/05/2013  . Syncope 06/26/2013  . COPD GOLD II 06/25/2013  . BPH (benign prostatic hyperplasia) 05/18/2013  . Glaucoma 05/18/2013  . S/P right TH revision 05/14/2013  . Constipation 03/26/2013  . Osteoporosis 03/26/2013  . S/P right THA, AA 03/20/2013  . Hyperlipidemia   . Erectile dysfunction   . History of asbestos exposure   . Nocturia   . History of shingles   . Dermatitis, atopic   . OA (osteoarthritis) of hip   . Arthritis   . Frequency of urination   . Lumbar scoliosis   . Malignant neoplasm of prostate ([place]) 06/07/2011  . 85 year old gentleman with stage T3 adenocarcinoma prostate with Gleason score 4+4 and PSA of 10.3 05/11/2011    Dariela Stoker, [place], PT 03/07/2018, 8:45 PM  [facility] [address] [place], [place], [address] Phone: [phone]   Fax:  [phone]  Name: Derek Carr MRN: [id number] Date of Birth: July 27, 1931

## 2018-03-08 DIAGNOSIS — R269 Unspecified abnormalities of gait and mobility: Secondary | ICD-10-CM | POA: Diagnosis not present

## 2018-03-08 DIAGNOSIS — Z6824 Body mass index (BMI) 24.0-24.9, adult: Secondary | ICD-10-CM | POA: Diagnosis not present

## 2018-03-08 DIAGNOSIS — G259 Extrapyramidal and movement disorder, unspecified: Secondary | ICD-10-CM | POA: Diagnosis not present

## 2018-03-08 DIAGNOSIS — I10 Essential (primary) hypertension: Secondary | ICD-10-CM | POA: Diagnosis not present

## 2018-03-08 DIAGNOSIS — G4730 Sleep apnea, unspecified: Secondary | ICD-10-CM | POA: Diagnosis not present

## 2018-03-10 ENCOUNTER — Encounter: Payer: Self-pay | Admitting: Physical Therapy

## 2018-03-10 ENCOUNTER — Ambulatory Visit: Payer: Medicare Other | Admitting: Physical Therapy

## 2018-03-10 DIAGNOSIS — R2689 Other abnormalities of gait and mobility: Secondary | ICD-10-CM | POA: Diagnosis not present

## 2018-03-10 DIAGNOSIS — R2681 Unsteadiness on feet: Secondary | ICD-10-CM

## 2018-03-10 DIAGNOSIS — M6281 Muscle weakness (generalized): Secondary | ICD-10-CM

## 2018-03-11 DIAGNOSIS — R0602 Shortness of breath: Secondary | ICD-10-CM | POA: Diagnosis not present

## 2018-03-11 DIAGNOSIS — G8918 Other acute postprocedural pain: Secondary | ICD-10-CM | POA: Diagnosis not present

## 2018-03-11 DIAGNOSIS — S82842A Displaced bimalleolar fracture of left lower leg, initial encounter for closed fracture: Secondary | ICD-10-CM | POA: Diagnosis not present

## 2018-03-11 DIAGNOSIS — G4731 Primary central sleep apnea: Secondary | ICD-10-CM | POA: Diagnosis not present

## 2018-03-11 DIAGNOSIS — R351 Nocturia: Secondary | ICD-10-CM | POA: Diagnosis not present

## 2018-03-11 DIAGNOSIS — Z471 Aftercare following joint replacement surgery: Secondary | ICD-10-CM | POA: Diagnosis not present

## 2018-03-11 DIAGNOSIS — R531 Weakness: Secondary | ICD-10-CM | POA: Diagnosis not present

## 2018-03-12 NOTE — Therapy (Signed)
[facility] [address] [place], [place], [address] Phone: [phone]   Fax:  [phone]  Physical Therapy Treatment  Patient Details  Name: Derek Carr MRN: [id number] Date of Birth: 1931-02-19 Referring Provider (PT): Mechele Claude, [place]   Encounter Date: 03/10/2018    03/10/18 1319  PT Visits / Re-Eval  Visit Number 77  Number of Visits 28  Date for PT Re-Evaluation 04/24/18  Authorization  Authorization Type Aetna Medicare & Generic commercial 2nd  Authorization Time Period Progress Note due visit 48  PT Time Calculation  PT Start Time 1317  PT Stop Time 1400  PT Time Calculation (min) 43 min  PT - End of Session  Equipment Utilized During Treatment Gait belt  Activity Tolerance Patient tolerated treatment well  Behavior During Therapy WFL for tasks assessed/performed     Past Medical History:  Diagnosis Date  . AAA (abdominal aortic aneurysm) ([place])   . Anemia   . Ankle fracture    left  . Aortic regurgitation   . Atherosclerosis   . Complication of anesthesia    CONFUSION - Pt's family very concerned about this  . DDD (degenerative disc disease)   . Degenerative arthritis   . Dermatitis, atopic ARMS AND LEGS  . Diverticulosis   . Erectile dysfunction   . Frequency of urination   . Glaucoma   . History of asbestos exposure   . History of radiation therapy 8/19-13-10/18/11   prostate, 2 GY  . History of shingles 2012-- BILATERAL EYES--  NO RESIDUAL  . Hyperlipidemia   . Hypertension   . Inguinal hernia   . Lumbar scoliosis   . Nocturia   . OA (osteoarthritis) of hip RIGHT  . Prostate cancer ([place]) 05/11/2011   bx=Adenocarcinoma,gleason3+4=7, 4+4=8,PSA=10.30volume=45.7cc  . Renal cyst    bilateral  . Smokers' cough ([place])   . Stroke ([place])   . Thyroid cyst     Past Surgical History:  Procedure Laterality Date  . ATTEMPTED LEFT VATS/ LEFT THORACOTOMY/ RESECTION OF THE ENORMOUS,  PROBABLE BRONCHOGENIC CYST  01-04-2005  DR Arlyce Dice  . CARDIAC CATHETERIZATION  02-24-2009  DR NASHER   MINOR CORONARY ARTERY IRREGULARITIES/ NORMAL LVSF/ EF 65-70%  . CATARACT EXTRACTION    . COLONOSCOPY W/ POLYPECTOMY    . CYSTOSCOPY  11/15/2011   Procedure: CYSTOSCOPY;  Surgeon: Dutch Gray, MD;  Location: [facility];  Service: Urology;  Laterality: N/A;  no seeds seen in bladder  . esophageus cyst removal  YRS AGO  . [place]  . ORIF ANKLE FRACTURE Left 05/03/2017  . ORIF ANKLE FRACTURE Left 05/03/2017   Procedure: OPEN REDUCTION INTERNAL FIXATION (ORIF) BIMALLEOLAR  ANKLE FRACTURE;  Surgeon: Wylene Simmer, MD;  Location: [facility];  Service: Orthopedics;  Laterality: Left;  . PACEMAKER IMPLANT N/A 09/16/2017   Procedure: PACEMAKER IMPLANT;  Surgeon: Constance Haw, MD;  Location: [facility];  Service: Cardiovascular;  Laterality: N/A;  . PROSTATE BIOPSY  05/11/11   Adenocarcinoma (MD OFFICE)  . RADIOACTIVE SEED IMPLANT  11/15/2011   Procedure: RADIOACTIVE SEED IMPLANT;  Surgeon: Dutch Gray, MD;  Location: [facility];  Service: Urology;  Laterality: N/A;  Total number of seeds - 52  . REPAIR RIGHT INGUINAL HERNIA W/ MESH  09-10-1999  . TOTAL HIP ARTHROPLASTY Right 03/20/2013   Procedure: RIGHT TOTAL HIP ARTHROPLASTY ANTERIOR APPROACH;  Surgeon: [name], MD;  [facility];  Service: Orthopedics;  Laterality: Right;  .  TOTAL HIP REVISION Right 05/14/2013   Procedure: open reduction internal fixation REVISION RIGHT HIP ;  Surgeon: [name], MD;  Location: [facility];  Service: Orthopedics;  Laterality: Right;  . UMBILICAL HERNIA REPAIR  1998   epigastric    There were no vitals filed for this visit.     03/10/18 1318  Symptoms/Limitations  Subjective No new complaints. No falls or pain to report.   Patient is accompained by: Family member  Pertinent History left ankle fracture, TIAs, DDD, Lumbar scoliosis, Glaucoma,  cataract surgery, HTN, right THR 03/20/13 with revision 05/14/13, right femur fracture, dementia/Alzheimers  Limitations Standing;Walking;House hold activities  Patient Stated Goals Patient wants to improve his balance & walking  Pain Assessment  Currently in Pain? No/denies  Pain Score 0      03/10/18 1320  Transfers  Transfers Sit to Stand;Stand to Sit  Sit to Stand 5: Supervision;With upper extremity assist;From bed;From chair/3-in-1  Stand to Sit 5: Supervision;With upper extremity assist;With armrests;To chair/3-in-1;To bed  Ambulation/Gait  Ambulation/Gait Yes  Ambulation/Gait Assistance 5: Supervision  Ambulation/Gait Assistance Details cues for more upright posture, to stay closer to walker with gait and for improved bil foot clearance with gait.  Ambulation Distance (Feet)  (in/out/around gym )  Assistive device Rolling walker  Gait Pattern Step-through pattern;Decreased stance time - left;Decreased step length - right;Decreased weight shift to left;Left hip hike;Antalgic;Lateral hip instability;Trunk flexed  Ambulation Surface Level;Indoor  High Level Balance  High Level Balance Activities Side stepping;Backward walking  High Level Balance Comments at counter top with bil UE support (counter/HHA): 3-4 lap each with cues on posture and ex form/technique  Neuro Re-ed   Neuro Re-ed Details  for balance/muscle re-ed/coordination: standing facing counter to- lateral stepping with same side lateral reaching x 6 reps each side, then lateral side stepping with same side reaching up to target on top cabinet x 6 reps each side. verbal/visual cues needed for incr step, weight shifting and for tall posture in stance.   Exercises  Exercises Other Exercises  Other Exercises  seated on green air disc: with green band- rows, shoulder extension, shoulder horizontal abd x 10 reps each with cues to maintain tall posture; with 3# weighted dowel rod- UE raises x10 reps, then upper trunk rotation x  10 reps each way. cues to maintain tall posture.   Knee/Hip Exercises: Aerobic  Other Aerobic Scifit level 3.0 for 8 minutes with UE/LE's with goal >/= 60 rpm for strengthening and activity tolerance       PT Short Term Goals - 03/09/18 0654      PT SHORT TERM GOAL #1   Title  Pt will be independent with updated HEP exercises (All STGs Target Date 03/24/2018)    Time  1    Period  Months    Status  Revised    Target Date  03/24/18      PT SHORT TERM GOAL #2   Title  Patient and daughter verbalize understanding of community based fitness options including aquatic options.     Time  1    Period  Months    Status  New    Target Date  03/24/18      PT SHORT TERM GOAL #3   Title  Timed Up & Go with cane <15sec with supervision.     Baseline        Time  1    Period  Months    Status  On-going    Target Date  03/24/18      PT SHORT TERM GOAL #4   Title  Merrilee Jansky Balance >/= 42/56    Time  1    Period  Months    Status  Revised    Target Date  03/24/18      PT SHORT TERM GOAL #5   Title  Patient ambulates 100' around furniture carrying cup with cane with supervision.     Time  1    Period  Months    Status  Revised    Target Date  03/24/18        PT Long Term Goals - 01/25/18 1417      PT LONG TERM GOAL #1   Title  Pt will demonstrate independence with ongoing HEP program and fitness plan including returning to aquatic class at [facility] (All LTGs Target Date 04/21/2018)    Time  3    Period  Months    Status  On-going    Target Date  04/21/18      PT LONG TERM GOAL #2   Title  Patient ambulates Modified Independent 500' outdoors including grass, pavement, curbs, ramps with LRAD to improve community ambulatory skills    Time  3    Period  Months    Status  Revised    Target Date  04/21/18      PT LONG TERM GOAL #3   Title  Berg Balance >/= 45/56 to indicate lower fall risk.     Time  3    Period  Months    Status  On-going    Target Date  04/21/18       PT LONG TERM GOAL #4   Title  Timed Up-Go time with cane <13.5 seconds to indicate lower fall risk.     Time  3    Period  Months    Status  On-going    Target Date  04/21/18      PT LONG TERM GOAL #5   Title  Patient ambulates household activities including negotiating furniture, scanning & carrying light items with cane modified independent.     Time  3    Period  Months    Status  New    Target Date  04/21/18         03/10/18 1319  Plan  Clinical Impression Statement Today's skilled session continued to focus on strengthening, activity tolerance, postural training and balance reactions with no issues reported.   Pt will benefit from skilled therapeutic intervention in order to improve on the following deficits Abnormal gait;Decreased activity tolerance;Decreased balance;Decreased endurance;Decreased knowledge of use of DME;Decreased mobility;Decreased range of motion;Decreased safety awareness;Decreased strength;Impaired flexibility;Postural dysfunction;Dizziness  Rehab Potential Good  Clinical Impairments Affecting Rehab Potential progressive decline over 2 years, good attitude and family support with 4 children in area.   PT Frequency 2x / week  PT Duration 12 weeks  PT Treatment/Interventions Gait training;Functional mobility training;Neuromuscular re-education;Balance training;Therapeutic exercise;Therapeutic activities;Patient/family education;ADLs/Self Care Home Management;Stair training;Orthotic Fit/Training;Manual techniques;Vestibular;Canalith Repostioning;Aquatic Therapy  PT Next Visit Plan cont balance and gait training; aquatic therapy 1x a week for 1-2 more visits.  Consulted and Agree with Plan of Care Patient  Family Member Consulted daughter          Patient will benefit from skilled therapeutic intervention in order to improve the following deficits and impairments:  Abnormal gait, Decreased activity tolerance, Decreased balance, Decreased endurance, Decreased  knowledge of use of DME, Decreased mobility, Decreased range of motion, Decreased safety awareness,  Decreased strength, Impaired flexibility, Postural dysfunction, Dizziness  Visit Diagnosis: Other abnormalities of gait and mobility  Muscle weakness (generalized)  Unsteadiness on feet     Problem List Patient Active Problem List   Diagnosis Date Noted  . Bradycardia 09/16/2017  . CKD (chronic kidney disease), stage III ([place]) 09/15/2017  . Near syncope 09/14/2017  . Post-operative pain   . Dementia without behavioral disturbance ([place])   . TIA (transient ischemic attack)   . Reactive hypertension   . Bimalleolar fracture of left ankle 05/03/2017  . Axonal neuropathy 03/21/2017  . Right sided weakness 03/01/2017  . Cigarette smoker 06/16/2016  . Dyspnea on exertion 06/15/2016  . Alzheimer disease ([place]) 01/07/2014  . CSA (central sleep apnea) 10/05/2013  . Syncope 06/26/2013  . COPD GOLD II 06/25/2013  . BPH (benign prostatic hyperplasia) 05/18/2013  . Glaucoma 05/18/2013  . S/P right TH revision 05/14/2013  . Constipation 03/26/2013  . Osteoporosis 03/26/2013  . S/P right THA, AA 03/20/2013  . Hyperlipidemia   . Erectile dysfunction   . History of asbestos exposure   . Nocturia   . History of shingles   . Dermatitis, atopic   . OA (osteoarthritis) of hip   . Arthritis   . Frequency of urination   . Lumbar scoliosis   . Malignant neoplasm of prostate ([place]) 06/07/2011  . 85 year old gentleman with stage T3 adenocarcinoma prostate with Gleason score 4+4 and PSA of 10.3 05/11/2011    Willow Ora, PTA, [facility] [address], [place],  [address] [phone] 03/12/18, 8:55 PM   Name: AUTUMN GUNN MRN: [id number] Date of Birth: September 06, 1931

## 2018-03-13 ENCOUNTER — Ambulatory Visit: Payer: Medicare Other | Admitting: Physical Therapy

## 2018-03-14 ENCOUNTER — Ambulatory Visit: Payer: Medicare HMO | Admitting: Physical Therapy

## 2018-03-16 ENCOUNTER — Ambulatory Visit: Payer: Medicare Other | Admitting: Physical Therapy

## 2018-03-16 DIAGNOSIS — M6281 Muscle weakness (generalized): Secondary | ICD-10-CM

## 2018-03-16 DIAGNOSIS — R2681 Unsteadiness on feet: Secondary | ICD-10-CM

## 2018-03-16 DIAGNOSIS — R2689 Other abnormalities of gait and mobility: Secondary | ICD-10-CM | POA: Diagnosis not present

## 2018-03-16 NOTE — Therapy (Signed)
[facility] [address] [place], [place], [address] Phone: [phone]   Fax:  [phone]  Physical Therapy Treatment  Patient Details  Name: Derek Carr MRN: [id number] Date of Birth: 29-Aug-1931 Referring Provider (PT): Mechele Claude, [place]   Encounter Date: 03/16/2018  PT End of Session - 03/16/18 2145    Visit Number  50    Number of Visits  89    Date for PT Re-Evaluation  04/24/18    Authorization Type  [employer] Medicare & Generic commercial 2nd    Authorization Time Period  Progress Note due visit 19    PT Start Time  1315    PT Stop Time  1402    PT Time Calculation (min)  47 min    Equipment Utilized During Treatment  Gait belt       Past Medical History:  Diagnosis Date  . AAA (abdominal aortic aneurysm) ([place])   . Anemia   . Ankle fracture    left  . Aortic regurgitation   . Atherosclerosis   . Complication of anesthesia    CONFUSION - Pt's family very concerned about this  . DDD (degenerative disc disease)   . Degenerative arthritis   . Dermatitis, atopic ARMS AND LEGS  . Diverticulosis   . Erectile dysfunction   . Frequency of urination   . Glaucoma   . History of asbestos exposure   . History of radiation therapy 8/19-13-10/18/11   prostate, 24 GY  . History of shingles 2012-- BILATERAL EYES--  NO RESIDUAL  . Hyperlipidemia   . Hypertension   . Inguinal hernia   . Lumbar scoliosis   . Nocturia   . OA (osteoarthritis) of hip RIGHT  . Prostate cancer ([place]) 05/11/2011   bx=Adenocarcinoma,gleason3+4=7, 4+4=8,PSA=10.30volume=45.7cc  . Renal cyst    bilateral  . Smokers' cough ([place])   . Stroke ([place])   . Thyroid cyst     Past Surgical History:  Procedure Laterality Date  . ATTEMPTED LEFT VATS/ LEFT THORACOTOMY/ RESECTION OF THE ENORMOUS, PROBABLE BRONCHOGENIC CYST  01-04-2005  DR Arlyce Dice  . CARDIAC CATHETERIZATION  02-24-2009  DR NASHER   MINOR CORONARY ARTERY IRREGULARITIES/ NORMAL LVSF/  EF 65-70%  . CATARACT EXTRACTION    . COLONOSCOPY W/ POLYPECTOMY    . CYSTOSCOPY  11/15/2011   Procedure: CYSTOSCOPY;  Surgeon: Dutch Gray, MD;  Location[facility];  Service: Urology;  Laterality: N/A;  no seeds seen in bladder  . esophageus cyst removal  YRS AGO  . [place]  . ORIF ANKLE FRACTURE Left 05/03/2017  . ORIF ANKLE FRACTURE Left 05/03/2017   Procedure: OPEN REDUCTION INTERNAL FIXATION (ORIF) BIMALLEOLAR  ANKLE FRACTURE;  Surgeon: Wylene Simmer, MD;  Location: [facility];  Service: Orthopedics;  Laterality: Left;  . PACEMAKER IMPLANT N/A 09/16/2017   Procedure: PACEMAKER IMPLANT;  Surgeon: Constance Haw, MD;  Location: [facility];  Service: Cardiovascular;  Laterality: N/A;  . PROSTATE BIOPSY  05/11/11   Adenocarcinoma (MD OFFICE)  . RADIOACTIVE SEED IMPLANT  11/15/2011   Procedure: RADIOACTIVE SEED IMPLANT;  Surgeon: Dutch Gray, MD;  Location: [facility];  Service: Urology;  Laterality: N/A;  Total number of seeds - 52  . REPAIR RIGHT INGUINAL HERNIA W/ MESH  09-10-1999  . TOTAL HIP ARTHROPLASTY Right 03/20/2013   Procedure: RIGHT TOTAL HIP ARTHROPLASTY ANTERIOR APPROACH;  Surgeon: [name], MD;  [facility];  Service: Orthopedics;  Laterality: Right;  .  TOTAL HIP REVISION Right 05/14/2013   Procedure: open reduction internal fixation REVISION RIGHT HIP ;  Surgeon: [name], MD;  Location[facility];  Service: Orthopedics;  Laterality: Right;  . UMBILICAL HERNIA REPAIR  1998   epigastric    There were no vitals filed for this visit.  Subjective Assessment - 03/16/18 2126    Subjective  Pt feeling better today - was sick earlier in the week and cancelled pool appt.;  daughter arrives to appt near end of session and states that the water temperature at [facility] is too cold for patient - is going to cancel his one remaining pool appt due to water being too cold for patient - she states she does not want him to get  sick     Patient is accompained by:  Family member    Pertinent History  left ankle fracture, TIAs, DDD, Lumbar scoliosis, Glaucoma, cataract surgery, HTN, right THR 03/20/13 with revision 05/14/13, right femur fracture, dementia/Alzheimers    Limitations  Standing;Walking;House hold activities    Patient Stated Goals  Patient wants to improve his balance & walking    Currently in Pain?  No/denies                       [facility] Adult PT Treatment/Exercise - 03/16/18 1329      Transfers   Transfers  Sit to Stand    Sit to Stand  5: Supervision    Number of Reps  Other reps (comment)   5   Comments  no UE support used - performed from mat table      Ambulation/Gait   Ambulation/Gait  Yes    Ambulation/Gait Assistance  4: Min guard    Ambulation/Gait Assistance Details  pt's RW was raised 1 notch to facilitate more upright posture; tennis balls were replaced on pt's RW to enable it to roll easier    Ambulation Distance (Feet)  230 Feet   with RW:  3 laps (350') with SPC with rubber quad tip    Assistive device  Rolling walker;Straight cane    Gait Pattern  Step-through pattern;Trunk flexed    Ambulation Surface  Level;Indoor      Self-Care   Other Self-Care Comments   discussed aquatic therapy offered at [facility] - called to inquire about the water temp at [facility] which they use; informed pt's daughter that no other pool in [place] would be warmer than 87-88 degrees as none are therapeutic pools                                          Knee/Hip Exercises: Aerobic   Recumbent Bike  SciFit level 2.0 x 10" with UE's and LE's       NeuroRe-ed:  Pt performed marching in place with UE support on PT's forearms - 10 reps each leg   Touching balance bubbles (3) with min to mod assist without UE support - touching each one as a target in various sequences Stepping over and back of black balance beam with UE support on // bar as needed with min assist - 10 reps each  leg  Pt performed ladder activity for improved SLS and increased step length on each leg with use of SPC with min assist - 4 reps       PT Education - 03/16/18 2143    Education  provided  Yes    Education Details  discussed pool temp - contacted [place] regarding the temp of [facility] pool which they use - Cleophus Molt emailed to say pool temp was 86-88 degrees, but was 80 degrees on Tues. due to heater problem Monday night     Person(s) Educated  Patient;Child(ren)    Methods  Explanation    Comprehension  Verbalized understanding       PT Short Term Goals - 03/09/18 0654      PT SHORT TERM GOAL #1   Title  Pt will be independent with updated HEP exercises (All STGs Target Date 03/24/2018)    Time  1    Period  Months    Status  Revised    Target Date  03/24/18      PT SHORT TERM GOAL #2   Title  Patient and daughter verbalize understanding of community based fitness options including aquatic options.     Time  1    Period  Months    Status  New    Target Date  03/24/18      PT SHORT TERM GOAL #3   Title  Timed Up & Go with cane <15sec with supervision.     Baseline        Time  1    Period  Months    Status  On-going    Target Date  03/24/18      PT SHORT TERM GOAL #4   Title  Berg Balance >/= 42/56    Time  1    Period  Months    Status  Revised    Target Date  03/24/18      PT SHORT TERM GOAL #5   Title  Patient ambulates 100' around furniture carrying cup with cane with supervision.     Time  1    Period  Months    Status  Revised    Target Date  03/24/18        PT Long Term Goals - 01/25/18 1417      PT LONG TERM GOAL #1   Title  Pt will demonstrate independence with ongoing HEP program and fitness plan including returning to aquatic class at [facility] (All LTGs Target Date 04/21/2018)    Time  3    Period  Months    Status  On-going    Target Date  04/21/18      PT LONG TERM GOAL #2   Title  Patient ambulates Modified Independent 500'  outdoors including grass, pavement, curbs, ramps with LRAD to improve community ambulatory skills    Time  3    Period  Months    Status  Revised    Target Date  04/21/18      PT LONG TERM GOAL #3   Title  Berg Balance >/= 45/56 to indicate lower fall risk.     Time  3    Period  Months    Status  On-going    Target Date  04/21/18      PT LONG TERM GOAL #4   Title  Timed Up-Go time with cane <13.5 seconds to indicate lower fall risk.     Time  3    Period  Months    Status  On-going    Target Date  04/21/18      PT LONG TERM GOAL #5   Title  Patient ambulates household activities including negotiating furniture, scanning & carrying light items with  cane modified independent.     Time  3    Period  Months    Status  New    Target Date  04/21/18            Plan - 03/16/18 2146    Clinical Impression Statement  Session focused on gait training - initially with RW but progressed to use of SPC due to pt amb. well with RW with good foot clearance in swing phase of gait; pt did well with use of SPC with pt maintaining balance and no shuffling steps noted.  Would want to see consistency in performance with amb. with SPC prior to telling pt that he is able to safely use [facility] for assistance with household ambulation.  Pt needs CGA for safety due to slight unsteadiness based on today's PT session.                                                                                                                                             Clinical Impairments Affecting Rehab Potential  progressive decline over 2 years, good attitude and family support with 4 children in area.     PT Frequency  2x / week    PT Duration  12 weeks    PT Treatment/Interventions  Gait training;Functional mobility training;Neuromuscular re-education;Balance training;Therapeutic exercise;Therapeutic activities;Patient/family education;ADLs/Self Care Home Management;Stair training;Orthotic Fit/Training;Manual  techniques;Vestibular;Canalith Repostioning;Aquatic Therapy    PT Next Visit Plan  cont balance and gait training; daughter has cancelled aquatic therapy due to water temp too cold for pt - wants to cont with land therapy 2x/week     PT [place] and Agree with Plan of Care  Patient    Family Member Consulted  daughter       Patient will benefit from skilled therapeutic intervention in order to improve the following deficits and impairments:  Abnormal gait, Decreased activity tolerance, Decreased balance, Decreased endurance, Decreased knowledge of use of DME, Decreased mobility, Decreased range of motion, Decreased safety awareness, Decreased strength, Impaired flexibility, Postural dysfunction, Dizziness  Visit Diagnosis: Other abnormalities of gait and mobility  Muscle weakness (generalized)  Unsteadiness on feet     Problem List Patient Active Problem List   Diagnosis Date Noted  . Bradycardia 09/16/2017  . CKD (chronic kidney disease), stage III ([place]) 09/15/2017  . Near syncope 09/14/2017  . Post-operative pain   . Dementia without behavioral disturbance ([place])   . TIA (transient ischemic attack)   . Reactive hypertension   . Bimalleolar fracture of left ankle 05/03/2017  . Axonal neuropathy 03/21/2017  . Right sided weakness 03/01/2017  . Cigarette smoker 06/16/2016  . Dyspnea on exertion 06/15/2016  . Alzheimer disease ([place]) 01/07/2014  . CSA (central sleep apnea) 10/05/2013  . Syncope 06/26/2013  . COPD GOLD II 06/25/2013  . BPH (benign prostatic hyperplasia) 05/18/2013  .  Glaucoma 05/18/2013  . S/P right TH revision 05/14/2013  . Constipation 03/26/2013  . Osteoporosis 03/26/2013  . S/P right THA, AA 03/20/2013  . Hyperlipidemia   . Erectile dysfunction   . History of asbestos exposure   . Nocturia   . History of shingles   . Dermatitis, atopic   . OA (osteoarthritis) of hip   . Arthritis   . Frequency of urination   .  Lumbar scoliosis   . Malignant neoplasm of prostate ([place]) 06/07/2011  . 85 year old gentleman with stage T3 adenocarcinoma prostate with Gleason score 4+4 and PSA of 10.3 05/11/2011    Attie Nawabi, [place], PT 03/16/2018, 9:55 PM  [facility] [address] [place], [place], [address] Phone: [phone]   Fax:  [phone]  Name: Derek Carr MRN: [id number] Date of Birth: 10-27-1931

## 2018-03-17 ENCOUNTER — Ambulatory Visit: Payer: Medicare Other | Admitting: Physical Therapy

## 2018-03-17 ENCOUNTER — Encounter: Payer: Self-pay | Admitting: Physical Therapy

## 2018-03-17 DIAGNOSIS — R2689 Other abnormalities of gait and mobility: Secondary | ICD-10-CM

## 2018-03-17 DIAGNOSIS — R2681 Unsteadiness on feet: Secondary | ICD-10-CM

## 2018-03-17 DIAGNOSIS — M6281 Muscle weakness (generalized): Secondary | ICD-10-CM

## 2018-03-19 NOTE — Therapy (Signed)
[facility] [address] [place], [place], [address] Phone: [phone]   Fax:  [phone]  Physical Therapy Treatment  Patient Details  Name: MACKLEN WILHOITE MRN: [id number] Date of Birth: 10/24/1931 Referring Provider (PT): Mechele Claude, [place]   Encounter Date: 03/17/2018     03/17/18 1321  PT Visits / Re-Eval  Visit Number 34  Number of Visits 85  Date for PT Re-Evaluation 04/24/18  Authorization  Authorization Type [employer]na Medicare & Generic commercial 2nd  Authorization Time Period Progress Note due visit 20  PT Time Calculation  PT Start Time 1318  PT Stop Time 1400  PT Time Calculation (min) 42 min  PT - End of Session  Equipment Utilized During Treatment Gait belt  Activity Tolerance Patient tolerated treatment well  Behavior During Therapy WFL for tasks assessed/performed    Past Medical History:  Diagnosis Date  . AAA (abdominal aortic aneurysm) ([place])   . Anemia   . Ankle fracture    left  . Aortic regurgitation   . Atherosclerosis   . Complication of anesthesia    CONFUSION - Pt's family very concerned about this  . DDD (degenerative disc disease)   . Degenerative arthritis   . Dermatitis, atopic ARMS AND LEGS  . Diverticulosis   . Erectile dysfunction   . Frequency of urination   . Glaucoma   . History of asbestos exposure   . History of radiation therapy 8/19-13-10/18/11   prostate, 65 GY  . History of shingles 2012-- BILATERAL EYES--  NO RESIDUAL  . Hyperlipidemia   . Hypertension   . Inguinal hernia   . Lumbar scoliosis   . Nocturia   . OA (osteoarthritis) of hip RIGHT  . Prostate cancer ([place]) 05/11/2011   bx=Adenocarcinoma,gleason3+4=7, 4+4=8,PSA=10.30volume=45.7cc  . Renal cyst    bilateral  . Smokers' cough ([place])   . Stroke ([place])   . Thyroid cyst     Past Surgical History:  Procedure Laterality Date  . ATTEMPTED LEFT VATS/ LEFT THORACOTOMY/ RESECTION OF THE ENORMOUS,  PROBABLE BRONCHOGENIC CYST  01-04-2005  DR Arlyce Dice  . CARDIAC CATHETERIZATION  02-24-2009  DR NASHER   MINOR CORONARY ARTERY IRREGULARITIES/ NORMAL LVSF/ EF 65-70%  . CATARACT EXTRACTION    . COLONOSCOPY W/ POLYPECTOMY    . CYSTOSCOPY  11/15/2011   Procedure: CYSTOSCOPY;  Surgeon: Dutch Gray, MD;  Location: [facility];  Service: Urology;  Laterality: N/A;  no seeds seen in bladder  . esophageus cyst removal  YRS AGO  . [place]  . ORIF ANKLE FRACTURE Left 05/03/2017  . ORIF ANKLE FRACTURE Left 05/03/2017   Procedure: OPEN REDUCTION INTERNAL FIXATION (ORIF) BIMALLEOLAR  ANKLE FRACTURE;  Surgeon: Wylene Simmer, MD;  Location: [facility];  Service: Orthopedics;  Laterality: Left;  . PACEMAKER IMPLANT N/A 09/16/2017   Procedure: PACEMAKER IMPLANT;  Surgeon: Constance Haw, MD;  Location: [facility];  Service: Cardiovascular;  Laterality: N/A;  . PROSTATE BIOPSY  05/11/11   Adenocarcinoma ([facility] OFFICE)  . RADIOACTIVE SEED IMPLANT  11/15/2011   Procedure: RADIOACTIVE SEED IMPLANT;  Surgeon: Dutch Gray, MD;  Location: [facility];  Service: Urology;  Laterality: N/A;  Total number of seeds - 52  . REPAIR RIGHT INGUINAL HERNIA W/ MESH  09-10-1999  . TOTAL HIP ARTHROPLASTY Right 03/20/2013   Procedure: RIGHT TOTAL HIP ARTHROPLASTY ANTERIOR APPROACH;  Surgeon: [name], MD;  [facility];  Service: Orthopedics;  Laterality: Right;  .  TOTAL HIP REVISION Right 05/14/2013   Procedure: open reduction internal fixation REVISION RIGHT HIP ;  Surgeon: [name], MD;  [facility];  Service: Orthopedics;  Laterality: Right;  . UMBILICAL HERNIA REPAIR  1998   epigastric    There were no vitals filed for this visit.     03/17/18 1320  Symptoms/Limitations  Subjective No new complaints. No falls or pain to report.   Patient is accompained by: Family member (spouse)  Pertinent History left ankle fracture, TIAs, DDD, Lumbar scoliosis,  Glaucoma, cataract surgery, HTN, right THR 03/20/13 with revision 05/14/13, right femur fracture, dementia/Alzheimers  Limitations Standing;Walking;House hold activities  Patient Stated Goals Patient wants to improve his balance & walking  Pain Assessment  Currently in Pain? No/denies  Pain Score 0      03/17/18 1322  Transfers  Transfers Sit to Stand;Stand to Sit  Sit to Stand 5: Supervision;With upper extremity assist;From bed;From chair/3-in-1  Stand to Sit 5: Supervision;With upper extremity assist;To bed;To chair/3-in-1  Ambulation/Gait  Ambulation/Gait Yes  Ambulation/Gait Assistance 5: Supervision  Ambulation/Gait Assistance Details with RW: cues for increased step height and length; with cane: pt noted to have occasional heel catch with swing phase (good toe clearance). min assist x1 toward end of 1st lap when pt turned head/got distracted,   Ambulation Distance (Feet) 80 Feet (x1, 115 x1 RW; 115 x2 plus around gym with activity w/cane)  Assistive device Rolling walker  Gait Pattern Step-through pattern;Trunk flexed  Ambulation Surface Level;Indoor  High Level Balance  High Level Balance Activities Side stepping;Marching forwards;Marching backwards;Tandem walking (tandem gait fwd/bwd)  High Level Balance Comments at counter top with bil UE support (counter/HHA): 3-4 lap each with cues on posture and ex form/technique  Neuro Re-ed   Neuro Re-ed Details  for balance/coordination/muacle re-ed: fwd stepping over bolsters of vaired height with single UE support on counter top x6 laps, min gaurd to min assist for balance. cues for increased step height and increased step length.    Knee/Hip Exercises: Aerobic  Recumbent Bike SciFit level 2.5 x 10" with UE's and LE's with goal >/= 50 rpm for strengthening and activity tolerance.        PT Short Term Goals - 03/09/18 0654      PT SHORT TERM GOAL #1   Title  Pt will be independent with updated HEP exercises (All STGs Target Date  03/24/2018)    Time  1    Period  Months    Status  Revised    Target Date  03/24/18      PT SHORT TERM GOAL #2   Title  Patient and daughter verbalize understanding of community based fitness options including aquatic options.     Time  1    Period  Months    Status  New    Target Date  03/24/18      PT SHORT TERM GOAL #3   Title  Timed Up & Go with cane <15sec with supervision.     Baseline        Time  1    Period  Months    Status  On-going    Target Date  03/24/18      PT SHORT TERM GOAL #4   Title  Berg Balance >/= 42/56    Time  1    Period  Months    Status  Revised    Target Date  03/24/18      PT SHORT TERM GOAL #5  Title  Patient ambulates 100' around furniture carrying cup with cane with supervision.     Time  1    Period  Months    Status  Revised    Target Date  03/24/18        PT Long Term Goals - 01/25/18 1417      PT LONG TERM GOAL #1   Title  Pt will demonstrate independence with ongoing HEP program and fitness plan including returning to aquatic class at [facility] (All LTGs Target Date 04/21/2018)    Time  3    Period  Months    Status  On-going    Target Date  04/21/18      PT LONG TERM GOAL #2   Title  Patient ambulates Modified Independent 500' outdoors including grass, pavement, curbs, ramps with LRAD to improve community ambulatory skills    Time  3    Period  Months    Status  Revised    Target Date  04/21/18      PT LONG TERM GOAL #3   Title  Berg Balance >/= 45/56 to indicate lower fall risk.     Time  3    Period  Months    Status  On-going    Target Date  04/21/18      PT LONG TERM GOAL #4   Title  Timed Up-Go time with cane <13.5 seconds to indicate lower fall risk.     Time  3    Period  Months    Status  On-going    Target Date  04/21/18      PT LONG TERM GOAL #5   Title  Patient ambulates household activities including negotiating furniture, scanning & carrying light items with cane modified independent.      Time  3    Period  Months    Status  New    Target Date  04/21/18         03/17/18 1321  Plan  Clinical Impression Statement Today's skilled session continued to focus on activity tolerance, strengthening/balance and gait with cane. Pt with minor scuffing noted, mostely at mid foot to heel with swing phase, able to correct with cues. Of note pt did have one episode of significant balance loss. PTA had walked away from mat to place belt/cane next to counter. Pt stood up to don jacket without AD or anything in from of him. He had significant balance loss fwd. Pt did step his right foot and his spouse placed hand on his shoulder to sit him back down uncontrolled. Pt advised to have something sturdy to balance on when standing for ADLs such as donning a jacket. Will plan to check STGs next week.                               Pt will benefit from skilled therapeutic intervention in order to improve on the following deficits Abnormal gait;Decreased activity tolerance;Decreased balance;Decreased endurance;Decreased knowledge of use of DME;Decreased mobility;Decreased range of motion;Decreased safety awareness;Decreased strength;Impaired flexibility;Postural dysfunction;Dizziness  Clinical Impairments Affecting Rehab Potential progressive decline over 2 years, good attitude and family support with 4 children in area.   PT Frequency 2x / week  PT Duration 12 weeks  PT Treatment/Interventions Gait training;Functional mobility training;Neuromuscular re-education;Balance training;Therapeutic exercise;Therapeutic activities;Patient/family education;ADLs/Self Care Home Management;Stair training;Orthotic Fit/Training;Manual techniques;Vestibular;Canalith Repostioning;Aquatic Therapy  PT Next Visit Plan STGs due 03/24/2018; continue with balance and gait training.  PT [place]  Recommended Other Services has cancelled aquatic therapy due to water too cold, wants to cont iwth 2x a week on land    Consulted and Agree with Plan of Care Patient  Family Member Consulted daughter          Patient will benefit from skilled therapeutic intervention in order to improve the following deficits and impairments:  Abnormal gait, Decreased activity tolerance, Decreased balance, Decreased endurance, Decreased knowledge of use of DME, Decreased mobility, Decreased range of motion, Decreased safety awareness, Decreased strength, Impaired flexibility, Postural dysfunction, Dizziness  Visit Diagnosis: Other abnormalities of gait and mobility  Muscle weakness (generalized)  Unsteadiness on feet     Problem List Patient Active Problem List   Diagnosis Date Noted  . Bradycardia 09/16/2017  . CKD (chronic kidney disease), stage III ([place]) 09/15/2017  . Near syncope 09/14/2017  . Post-operative pain   . Dementia without behavioral disturbance ([place])   . TIA (transient ischemic attack)   . Reactive hypertension   . Bimalleolar fracture of left ankle 05/03/2017  . Axonal neuropathy 03/21/2017  . Right sided weakness 03/01/2017  . Cigarette smoker 06/16/2016  . Dyspnea on exertion 06/15/2016  . Alzheimer disease ([place]) 01/07/2014  . CSA (central sleep apnea) 10/05/2013  . Syncope 06/26/2013  . COPD GOLD II 06/25/2013  . BPH (benign prostatic hyperplasia) 05/18/2013  . Glaucoma 05/18/2013  . S/P right TH revision 05/14/2013  . Constipation 03/26/2013  . Osteoporosis 03/26/2013  . S/P right THA, AA 03/20/2013  . Hyperlipidemia   . Erectile dysfunction   . History of asbestos exposure   . Nocturia   . History of shingles   . Dermatitis, atopic   . OA (osteoarthritis) of hip   . Arthritis   . Frequency of urination   . Lumbar scoliosis   . Malignant neoplasm of prostate ([place]) 06/07/2011  . 85 year old gentleman with stage T3 adenocarcinoma prostate with Gleason score 4+4 and PSA of 10.3 05/11/2011    Willow Ora, PTA, [facility] [address] [place], [place] [address] [phone] 03/19/18, 9:04 PM   Name: XAYVIER VALLEZ MRN: [id number] Date of Birth: [birth year]

## 2018-03-20 ENCOUNTER — Ambulatory Visit: Payer: Medicare Other | Admitting: Physical Therapy

## 2018-03-20 ENCOUNTER — Ambulatory Visit: Payer: Medicare Other | Admitting: Rehabilitation

## 2018-03-21 ENCOUNTER — Ambulatory Visit: Payer: Medicare Other | Admitting: Physical Therapy

## 2018-03-21 ENCOUNTER — Ambulatory Visit: Payer: Medicare HMO | Admitting: Physical Therapy

## 2018-03-21 DIAGNOSIS — R2681 Unsteadiness on feet: Secondary | ICD-10-CM | POA: Diagnosis not present

## 2018-03-21 DIAGNOSIS — M6281 Muscle weakness (generalized): Secondary | ICD-10-CM | POA: Diagnosis not present

## 2018-03-21 DIAGNOSIS — N183 Chronic kidney disease, stage 3 (moderate): Secondary | ICD-10-CM | POA: Diagnosis not present

## 2018-03-21 DIAGNOSIS — M810 Age-related osteoporosis without current pathological fracture: Secondary | ICD-10-CM | POA: Diagnosis not present

## 2018-03-21 DIAGNOSIS — R2689 Other abnormalities of gait and mobility: Secondary | ICD-10-CM | POA: Diagnosis not present

## 2018-03-22 NOTE — Therapy (Signed)
[facility] [address] [place], [place], [address] Phone: [phone]   Fax:  [phone]  Physical Therapy Treatment  Patient Details  Name: Derek Carr MRN: [id number] Date of Birth: [birth year] Referring Provider (PT): Mechele Claude, [place]   Encounter Date: 03/21/2018  PT End of Session - 03/22/18 1436    Visit Number  61    Number of Visits  83    Date for PT Re-Evaluation  04/24/18    Authorization Type  [employer] Medicare & Generic commercial 2nd    Authorization Time Period  Progress Note due visit 54    PT Start Time  1445    PT Stop Time  1530    PT Time Calculation (min)  45 min    Equipment Utilized During Treatment  Gait belt       Past Medical History:  Diagnosis Date  . AAA (abdominal aortic aneurysm) ([place])   . Anemia   . Ankle fracture    left  . Aortic regurgitation   . Atherosclerosis   . Complication of anesthesia    CONFUSION - Pt's family very concerned about this  . DDD (degenerative disc disease)   . Degenerative arthritis   . Dermatitis, atopic ARMS AND LEGS  . Diverticulosis   . Erectile dysfunction   . Frequency of urination   . Glaucoma   . History of asbestos exposure   . History of radiation therapy 8/19-13-10/18/11   prostate, 27 GY  . History of shingles 2012-- BILATERAL EYES--  NO RESIDUAL  . Hyperlipidemia   . Hypertension   . Inguinal hernia   . Lumbar scoliosis   . Nocturia   . OA (osteoarthritis) of hip RIGHT  . Prostate cancer ([place]) 05/11/2011   bx=Adenocarcinoma,gleason3+4=7, 4+4=8,PSA=10.30volume=45.7cc  . Renal cyst    bilateral  . Smokers' cough ([place])   . Stroke ([place])   . Thyroid cyst     Past Surgical History:  Procedure Laterality Date  . ATTEMPTED LEFT VATS/ LEFT THORACOTOMY/ RESECTION OF THE ENORMOUS, PROBABLE BRONCHOGENIC CYST  01-04-2005  DR Arlyce Dice  . CARDIAC CATHETERIZATION  02-24-2009  DR NASHER   MINOR CORONARY ARTERY IRREGULARITIES/ NORMAL LVSF/  EF 65-70%  . CATARACT EXTRACTION    . COLONOSCOPY W/ POLYPECTOMY    . CYSTOSCOPY  11/15/2011   Procedure: CYSTOSCOPY;  Surgeon: Dutch Gray, MD;  Location: [facility];  Service: Urology;  Laterality: N/A;  no seeds seen in bladder  . esophageus cyst removal  YRS AGO  . [place]  . ORIF ANKLE FRACTURE Left 05/03/2017  . ORIF ANKLE FRACTURE Left 05/03/2017   Procedure: OPEN REDUCTION INTERNAL FIXATION (ORIF) BIMALLEOLAR  ANKLE FRACTURE;  Surgeon: Wylene Simmer, MD;  Location: [facility];  Service: Orthopedics;  Laterality: Left;  . PACEMAKER IMPLANT N/A 09/16/2017   Procedure: PACEMAKER IMPLANT;  Surgeon: Constance Haw, MD;  Location: [facility];  Service: Cardiovascular;  Laterality: N/A;  . PROSTATE BIOPSY  05/11/11   Adenocarcinoma ([facility] OFFICE)  . RADIOACTIVE SEED IMPLANT  11/15/2011   Procedure: RADIOACTIVE SEED IMPLANT;  Surgeon: Dutch Gray, MD;  Location[facility];  Service: Urology;  Laterality: N/A;  Total number of seeds - 52  . REPAIR RIGHT INGUINAL HERNIA W/ MESH  09-10-1999  . TOTAL HIP ARTHROPLASTY Right 03/20/2013   Procedure: RIGHT TOTAL HIP ARTHROPLASTY ANTERIOR APPROACH;  Surgeon: [name], MD;  [facility];  Service: Orthopedics;  Laterality: Right;  .  TOTAL HIP REVISION Right 05/14/2013   Procedure: open reduction internal fixation REVISION RIGHT HIP ;  Surgeon: [name], MD;  [facility];  Service: Orthopedics;  Laterality: Right;  . UMBILICAL HERNIA REPAIR  1998   epigastric    There were no vitals filed for this visit.  Subjective Assessment - 03/22/18 1411    Subjective  Pt presents for PT session alone today - daughter dropped him off; pt reports no changes     Pertinent History  left ankle fracture, TIAs, DDD, Lumbar scoliosis, Glaucoma, cataract surgery, HTN, right THR 03/20/13 with revision 05/14/13, right femur fracture, dementia/Alzheimers    Patient Stated Goals  Patient wants to improve  his balance & walking                       [facility] Adult PT Treatment/Exercise - 03/22/18 0001      Transfers   Transfers  Sit to Stand    Sit to Stand  5: Supervision    Stand to Sit  5: Supervision    Comments  pt used UE support for sit to stand from mat table      Ambulation/Gait   Ambulation/Gait  Yes    Ambulation/Gait Assistance  4: Min guard    Ambulation/Gait Assistance Details  cues for incr. upright posture    Ambulation Distance (Feet)  230 Feet    Assistive device  Small based quad cane    Gait Pattern  Step-through pattern;Trunk flexed    Ambulation Surface  Level;Indoor      Standardized Balance Assessment   Standardized Balance Assessment  Timed Up and Go Test      Timed Up and Go Test   TUG  Normal TUG    Normal TUG (seconds)  16.87   with SBQC     Knee/Hip Exercises: Aerobic   Recumbent Bike  SciFit level 2.0 x 5" with UE's and LE's          Balance Exercises - 03/22/18 1431      Balance Exercises: Standing   Step Over Hurdles / Cones  pt performed stepping over and back of black balance beam 5 reps with each foot with UE support on SBQC and with min hand held assist for LUE    Other Standing Exercises  Pt performed touching 3 balance bubbles in various orders - standing at counter with RUE support and with CGA to min assist:  pt performed tap ups to 6" step with UE support on rails as needed 10 reps each foot;  marching in place at counter with UE support with CGA          PT Short Term Goals - 03/21/18 1459      PT SHORT TERM GOAL #1   Title  Pt will be independent with updated HEP exercises (All STGs Target Date 03/24/2018)    Baseline  HEP has not been finalized - continues to be revised - 03-21-18    Status  On-going      PT SHORT TERM GOAL #2   Title  Patient and daughter verbalize understanding of community based fitness options including aquatic options.     Baseline  Part of this goal is deferred due to pt and daughter  declining aquatic therapy with pt reporting water is too cold for him - 03-21-18    Status  On-going   On-going with aquatic HEP deferred due to pt's request     PT SHORT  TERM GOAL #3   Title  Timed Up & Go with cane <15sec with supervision.     Baseline  16.87 secs with SBQC with CGA - 03-21-18    Status  Not Met      PT SHORT TERM GOAL #4   Title  Berg Balance >/= 42/56      PT SHORT TERM GOAL #5   Title  Patient ambulates 100' around furniture carrying cup with cane with supervision.     Baseline  Pt unable to safely ambulate with [facility] with supervision due to increased fall risk due to balance deficits;  performance fluctuates - is inconsistent - 03-21-18    Status  Not Met        PT Long Term Goals - 01/25/18 1417      PT LONG TERM GOAL #1   Title  Pt will demonstrate independence with ongoing HEP program and fitness plan including returning to aquatic class at [facility] (All LTGs Target Date 04/21/2018)    Time  3    Period  Months    Status  On-going    Target Date  04/21/18      PT LONG TERM GOAL #2   Title  Patient ambulates Modified Independent 500' outdoors including grass, pavement, curbs, ramps with LRAD to improve community ambulatory skills    Time  3    Period  Months    Status  Revised    Target Date  04/21/18      PT LONG TERM GOAL #3   Title  Berg Balance >/= 45/56 to indicate lower fall risk.     Time  3    Period  Months    Status  On-going    Target Date  04/21/18      PT LONG TERM GOAL #4   Title  Timed Up-Go time with cane <13.5 seconds to indicate lower fall risk.     Time  3    Period  Months    Status  On-going    Target Date  04/21/18      PT LONG TERM GOAL #5   Title  Patient ambulates household activities including negotiating furniture, scanning & carrying light items with cane modified independent.     Time  3    Period  Months    Status  New    Target Date  04/21/18            Plan - 03/22/18 1437    Clinical Impression  Statement  Pt has not met any of the 4 STG's check today; STG's #1 and 2 remain ongoing as HEP continues to be updated and revised.  Pt discontinued participation in aquatic therapy due to pt stating that the water was too cold for him and he was unable to tolerate it.  Part of STG #2 which includes aquatic HEP is deferred.  STG #3 is not met due to TUG score 16.87 secs with SBQC with CGA needed for safety.  STG #5 not met due to pt is uanble to safely amb. with SBQC and carry cup of water with supervision - pt's balance deficits prevent pt from being able to safely perform this activity and also pt's performance varies.  Pt is safer with use of RW for increased stability.      Rehab Potential  Good    Clinical Impairments Affecting Rehab Potential  progressive decline over 2 years, good attitude and family support with 4 children in area.  PT Frequency  2x / week    PT Duration  12 weeks    PT Treatment/Interventions  Gait training;Functional mobility training;Neuromuscular re-education;Balance training;Therapeutic exercise;Therapeutic activities;Patient/family education;ADLs/Self Care Home Management;Stair training;Orthotic Fit/Training;Manual techniques;Vestibular;Canalith Repostioning;Aquatic Therapy    PT Next Visit Plan  Check STG #4 Merrilee Jansky) - only STG not assessed - due 03-24-18:  continue with balance and gait training    PT Home Exercise Plan  [facility]    Consulted and Agree with Plan of Care  Patient       Patient will benefit from skilled therapeutic intervention in order to improve the following deficits and impairments:  Abnormal gait, Decreased activity tolerance, Decreased balance, Decreased endurance, Decreased knowledge of use of DME, Decreased mobility, Decreased range of motion, Decreased safety awareness, Decreased strength, Impaired flexibility, Postural dysfunction, Dizziness  Visit Diagnosis: Other abnormalities of gait and mobility  Muscle weakness  (generalized)  Unsteadiness on feet     Problem List Patient Active Problem List   Diagnosis Date Noted  . Bradycardia 09/16/2017  . CKD (chronic kidney disease), stage III ([place]) 09/15/2017  . Near syncope 09/14/2017  . Post-operative pain   . Dementia without behavioral disturbance ([place])   . TIA (transient ischemic attack)   . Reactive hypertension   . Bimalleolar fracture of left ankle 05/03/2017  . Axonal neuropathy 03/21/2017  . Right sided weakness 03/01/2017  . Cigarette smoker 06/16/2016  . Dyspnea on exertion 06/15/2016  . Alzheimer disease ([place]) 01/07/2014  . CSA (central sleep apnea) 10/05/2013  . Syncope 06/26/2013  . COPD GOLD II 06/25/2013  . BPH (benign prostatic hyperplasia) 05/18/2013  . Glaucoma 05/18/2013  . S/P right TH revision 05/14/2013  . Constipation 03/26/2013  . Osteoporosis 03/26/2013  . S/P right THA, AA 03/20/2013  . Hyperlipidemia   . Erectile dysfunction   . History of asbestos exposure   . Nocturia   . History of shingles   . Dermatitis, atopic   . OA (osteoarthritis) of hip   . Arthritis   . Frequency of urination   . Lumbar scoliosis   . Malignant neoplasm of prostate ([place]) 06/07/2011  . 85 year old gentleman with stage T3 adenocarcinoma prostate with Gleason score 4+4 and PSA of 10.3 05/11/2011    Reilyn Nelson, [place], PT 03/22/2018, 2:47 PM  [name] [address] [place], [place], [address] Phone: [phone]   Fax:  [phone]  Name: Derek Carr MRN: [id number] Date of Birth: 1931/11/27

## 2018-03-24 ENCOUNTER — Ambulatory Visit: Payer: Medicare Other | Admitting: Rehabilitation

## 2018-03-24 ENCOUNTER — Encounter: Payer: Self-pay | Admitting: Rehabilitation

## 2018-03-24 DIAGNOSIS — R2689 Other abnormalities of gait and mobility: Secondary | ICD-10-CM

## 2018-03-24 DIAGNOSIS — M6281 Muscle weakness (generalized): Secondary | ICD-10-CM | POA: Diagnosis not present

## 2018-03-24 DIAGNOSIS — R2681 Unsteadiness on feet: Secondary | ICD-10-CM | POA: Diagnosis not present

## 2018-03-24 NOTE — Therapy (Signed)
[facility] [address] [place], [place], [address] Phone: [phone]   Fax:  [phone]  Physical Therapy Treatment  Patient Details  Name: Derek Carr MRN: [id number] Date of Birth: 1931-04-05 Referring Provider (PT): Mechele Claude, [place]   Encounter Date: 03/24/2018  PT End of Session - 03/24/18 1424    Visit Number  47    Number of Visits  4    Date for PT Re-Evaluation  04/24/18    Authorization Type  A[employer]na Medicare & Generic commercial 2nd    Authorization Time Period  Progress Note due visit 31    PT Start Time  1315    PT Stop Time  1410    PT Time Calculation (min)  55 min    Equipment Utilized During Treatment  Gait belt       Past Medical History:  Diagnosis Date  . AAA (abdominal aortic aneurysm) ([place])   . Anemia   . Ankle fracture    left  . Aortic regurgitation   . Atherosclerosis   . Complication of anesthesia    CONFUSION - Pt's family very concerned about this  . DDD (degenerative disc disease)   . Degenerative arthritis   . Dermatitis, atopic ARMS AND LEGS  . Diverticulosis   . Erectile dysfunction   . Frequency of urination   . Glaucoma   . History of asbestos exposure   . History of radiation therapy 8/19-13-10/18/11   prostate, 6 GY  . History of shingles 2012-- BILATERAL EYES--  NO RESIDUAL  . Hyperlipidemia   . Hypertension   . Inguinal hernia   . Lumbar scoliosis   . Nocturia   . OA (osteoarthritis) of hip RIGHT  . Prostate cancer ([place]) 05/11/2011   bx=Adenocarcinoma,gleason3+4=7, 4+4=8,PSA=10.30volume=45.7cc  . Renal cyst    bilateral  . Smokers' cough ([place])   . Stroke ([place])   . Thyroid cyst     Past Surgical History:  Procedure Laterality Date  . ATTEMPTED LEFT VATS/ LEFT THORACOTOMY/ RESECTION OF THE ENORMOUS, PROBABLE BRONCHOGENIC CYST  01-04-2005  DR Arlyce Dice  . CARDIAC CATHETERIZATION  02-24-2009  DR NASHER   MINOR CORONARY ARTERY IRREGULARITIES/ NORMAL LVSF/  EF 65-70%  . CATARACT EXTRACTION    . COLONOSCOPY W/ POLYPECTOMY    . CYSTOSCOPY  11/15/2011   Procedure: CYSTOSCOPY;  Surgeon: Dutch Gray, MD;  Location: [facility];  Service: Urology;  Laterality: N/A;  no seeds seen in bladder  . esophageus cyst removal  YRS AGO  . [place]  . ORIF ANKLE FRACTURE Left 05/03/2017  . ORIF ANKLE FRACTURE Left 05/03/2017   Procedure: OPEN REDUCTION INTERNAL FIXATION (ORIF) BIMALLEOLAR  ANKLE FRACTURE;  Surgeon: Wylene Simmer, MD;  Location: [facility];  Service: Orthopedics;  Laterality: Left;  . PACEMAKER IMPLANT N/A 09/16/2017   Procedure: PACEMAKER IMPLANT;  Surgeon: Constance Haw, MD;  Location: [facility];  Service: Cardiovascular;  Laterality: N/A;  . PROSTATE BIOPSY  05/11/11   Adenocarcinoma ([facility] OFFICE)  . RADIOACTIVE SEED IMPLANT  11/15/2011   Procedure: RADIOACTIVE SEED IMPLANT;  Surgeon: Dutch Gray, MD;  Location: [facility];  Service: Urology;  Laterality: N/A;  Total number of seeds - 52  . REPAIR RIGHT INGUINAL HERNIA W/ MESH  09-10-1999  . TOTAL HIP ARTHROPLASTY Right 03/20/2013   Procedure: RIGHT TOTAL HIP ARTHROPLASTY ANTERIOR APPROACH;  Surgeon: [name], MD;  [facility];  Service: Orthopedics;  Laterality: Right;  .  TOTAL HIP REVISION Right 05/14/2013   Procedure: open reduction internal fixation REVISION RIGHT HIP ;  Surgeon: [name], MD;  [facility];  Service: Orthopedics;  Laterality: Right;  . UMBILICAL HERNIA REPAIR  1998   epigastric    There were no vitals filed for this visit.  Subjective Assessment - 03/24/18 1317    Subjective  Pt reports no changes since last session.  Daughter present with pt today.     Pertinent History  left ankle fracture, TIAs, DDD, Lumbar scoliosis, Glaucoma, cataract surgery, HTN, right THR 03/20/13 with revision 05/14/13, right femur fracture, dementia/Alzheimers    Limitations  Standing;Walking;House hold activities     Patient Stated Goals  Patient wants to improve his balance & walking    Currently in Pain?  No/denies                       [facility] Adult PT Treatment/Exercise - 03/24/18 1318      Standardized Balance Assessment   Standardized Balance Assessment  Berg Balance Test      Berg Balance Test   Sit to Stand  Able to stand  independently using hands    Standing Unsupported  Able to stand 2 minutes with supervision    Sitting with Back Unsupported but Feet Supported on Floor or Stool  Able to sit safely and securely 2 minutes    Stand to Sit  Controls descent by using hands    Transfers  Able to transfer safely, definite need of hands    Standing Unsupported with Eyes Closed  Able to stand 10 seconds with supervision    Standing Ubsupported with Feet Together  Needs help to attain position but able to stand for 30 seconds with feet together    From Standing, Reach Forward with Outstretched Arm  Can reach forward >5 cm safely (2")    From Standing Position, Pick up Object from Floor  Able to pick up shoe, needs supervision    From Standing Position, Turn to Look Behind Over each Shoulder  Looks behind one side only/other side shows less weight shift    Turn 360 Degrees  Needs close supervision or verbal cueing    Standing Unsupported, Alternately Place Feet on Step/Stool  Able to complete >2 steps/needs minimal assist    Standing Unsupported, One Foot in Front  Able to take small step independently and hold 30 seconds    Standing on One Leg  Unable to try or needs assist to prevent fall    Total Score  32      Self-Care   Self-Care  Other Self-Care Comments    Other Self-Care Comments   Lengthy discussion with pts daughter regarding upcoming D/C.  She reports primary PT was to call [employer] center to see which equipment and classes would be appropriate for pt.  This PT to follow up with primary PT.  Also discussed that it is likely due to cognition that he does not do as much at  home.  PT got varying stories from daughter in that she initially reports he doesn't do much at home then she reports they do corner exercises daily.  Provided pt and daughter with OTAGO HEP during session and went through verbally to emphasize he can do these (5-6 exercises) 3 days per week to help balance and strength.  Pt and daughter verbalized understanding.       Neuro Re-ed    Neuro Re-ed Details  Reviewed current corner HEP on pillows.  Note that feet together EC is too difficult, therefore changed exercise to feet apart EC.  See pt instruction for details.        Exercises   Other Exercises   Performed supine hip flex stretch off EOM during session and added to HEP as hip flex play into his forward flexed posture.              PT Education - 03/24/18 1424    Education provided  Yes    Education Details  see self care     Person(s) Educated  Patient;Child(ren)    Methods  Explanation;Handout    Comprehension  Verbalized understanding       PT Short Term Goals - 03/24/18 1427      PT SHORT TERM GOAL #1   Title  Pt will be independent with updated HEP exercises (All STGs Target Date 03/24/2018)    Baseline  HEP has not been finalized - continues to be revised - 03-21-18    Status  On-going      PT SHORT TERM GOAL #2   Title  Patient and daughter verbalize understanding of community based fitness options including aquatic options.     Baseline  Part of this goal is deferred due to pt and daughter declining aquatic therapy with pt reporting water is too cold for him - 03-21-18    Status  Deferred   On-going with aquatic HEP deferred due to pt's request     PT SHORT TERM GOAL #3   Title  Timed Up & Go with cane <15sec with supervision.     Baseline  16.87 secs with SBQC with CGA - 03-21-18    Status  Not Met      PT SHORT TERM GOAL #4   Title  Berg Balance >/= 42/56    Baseline  32/56 on 03/24/18    Status  Not Met      PT SHORT TERM GOAL #5   Title  Patient ambulates  100' around furniture carrying cup with cane with supervision.     Baseline  Pt unable to safely ambulate with [facility] with supervision due to increased fall risk due to balance deficits;  performance fluctuates - is inconsistent - 03-21-18    Status  Not Met        PT Long Term Goals - 01/25/18 1417      PT LONG TERM GOAL #1   Title  Pt will demonstrate independence with ongoing HEP program and fitness plan including returning to aquatic class at [facility] (All LTGs Target Date 04/21/2018)    Time  3    Period  Months    Status  On-going    Target Date  04/21/18      PT LONG TERM GOAL #2   Title  Patient ambulates Modified Independent 500' outdoors including grass, pavement, curbs, ramps with LRAD to improve community ambulatory skills    Time  3    Period  Months    Status  Revised    Target Date  04/21/18      PT LONG TERM GOAL #3   Title  Berg Balance >/= 45/56 to indicate lower fall risk.     Time  3    Period  Months    Status  On-going    Target Date  04/21/18      PT LONG TERM GOAL #4   Title  Timed Up-Go time with cane <  13.5 seconds to indicate lower fall risk.     Time  3    Period  Months    Status  On-going    Target Date  04/21/18      PT LONG TERM GOAL #5   Title  Patient ambulates household activities including negotiating furniture, scanning & carrying light items with cane modified independent.     Time  3    Period  Months    Status  New    Target Date  04/21/18            Plan - 03/24/18 1425    Clinical Impression Statement  Skilled session focused on assessment of STG for BERG balance test.  Note decline from 37 in December to 32/56 today.  Pt remains a high fall risk and recommend he use RW at all times.  Also reviewed HEP and provided OTAGO for daughter/family to assist with to ensure compliance.  Will review in session next time.     Rehab Potential  Good    Clinical Impairments Affecting Rehab Potential  progressive decline over 2 years,  good attitude and family support with 4 children in area.     PT Frequency  2x / week    PT Duration  12 weeks    PT Treatment/Interventions  Gait training;Functional mobility training;Neuromuscular re-education;Balance training;Therapeutic exercise;Therapeutic activities;Patient/family education;ADLs/Self Care Home Management;Stair training;Orthotic Fit/Training;Manual techniques;Vestibular;Canalith Repostioning;Aquatic Therapy    PT Next Visit Plan  I gave them OTAGO last time, but only had time to go over verbally.  I'm not sure if he had it from before, but they didn't seem to recall having it.  Please go over exercises     PT [place] and Agree with Plan of Care  Patient       Patient will benefit from skilled therapeutic intervention in order to improve the following deficits and impairments:  Abnormal gait, Decreased activity tolerance, Decreased balance, Decreased endurance, Decreased knowledge of use of DME, Decreased mobility, Decreased range of motion, Decreased safety awareness, Decreased strength, Impaired flexibility, Postural dysfunction, Dizziness  Visit Diagnosis: Other abnormalities of gait and mobility  Muscle weakness (generalized)  Unsteadiness on feet     Problem List Patient Active Problem List   Diagnosis Date Noted  . Bradycardia 09/16/2017  . CKD (chronic kidney disease), stage III ([place]) 09/15/2017  . Near syncope 09/14/2017  . Post-operative pain   . Dementia without behavioral disturbance ([place])   . TIA (transient ischemic attack)   . Reactive hypertension   . Bimalleolar fracture of left ankle 05/03/2017  . Axonal neuropathy 03/21/2017  . Right sided weakness 03/01/2017  . Cigarette smoker 06/16/2016  . Dyspnea on exertion 06/15/2016  . Alzheimer disease ([place]) 01/07/2014  . CSA (central sleep apnea) 10/05/2013  . Syncope 06/26/2013  . COPD GOLD II 06/25/2013  . BPH (benign prostatic hyperplasia) 05/18/2013  .  Glaucoma 05/18/2013  . S/P right TH revision 05/14/2013  . Constipation 03/26/2013  . Osteoporosis 03/26/2013  . S/P right THA, AA 03/20/2013  . Hyperlipidemia   . Erectile dysfunction   . History of asbestos exposure   . Nocturia   . History of shingles   . Dermatitis, atopic   . OA (osteoarthritis) of hip   . Arthritis   . Frequency of urination   . Lumbar scoliosis   . Malignant neoplasm of prostate ([place]) 06/07/2011  . 85 year old gentleman with stage T3 adenocarcinoma prostate with Gleason score  4+4 and PSA of 10.3 05/11/2011    Cameron Sprang, PT, MPT [facility] [address] [place], [place], [address] Phone: [phone]   Fax:  [phone] 03/24/18, 2:29 PM  Name: Derek Carr MRN: [id number] Date of Birth: 1931-08-04

## 2018-03-28 ENCOUNTER — Encounter: Payer: Self-pay | Admitting: Physical Therapy

## 2018-03-28 ENCOUNTER — Ambulatory Visit: Payer: Medicare Other | Admitting: Physical Therapy

## 2018-03-28 ENCOUNTER — Ambulatory Visit: Payer: Medicare Other | Attending: Internal Medicine | Admitting: Physical Therapy

## 2018-03-28 DIAGNOSIS — R2689 Other abnormalities of gait and mobility: Secondary | ICD-10-CM | POA: Insufficient documentation

## 2018-03-28 DIAGNOSIS — R262 Difficulty in walking, not elsewhere classified: Secondary | ICD-10-CM | POA: Insufficient documentation

## 2018-03-28 DIAGNOSIS — M6281 Muscle weakness (generalized): Secondary | ICD-10-CM

## 2018-03-28 DIAGNOSIS — M79605 Pain in left leg: Secondary | ICD-10-CM | POA: Diagnosis not present

## 2018-03-28 DIAGNOSIS — R2681 Unsteadiness on feet: Secondary | ICD-10-CM

## 2018-03-28 DIAGNOSIS — M25672 Stiffness of left ankle, not elsewhere classified: Secondary | ICD-10-CM | POA: Insufficient documentation

## 2018-03-28 DIAGNOSIS — R42 Dizziness and giddiness: Secondary | ICD-10-CM | POA: Diagnosis not present

## 2018-03-28 DIAGNOSIS — R278 Other lack of coordination: Secondary | ICD-10-CM | POA: Diagnosis not present

## 2018-03-28 NOTE — Therapy (Signed)
[facility] [address] [place], [place], [address] Phone: [phone]   Fax:  [phone]  Physical Therapy Treatment  Patient Details  Name: Derek Carr MRN: [id number] Date of Birth: May 14, 1931 Referring Provider (PT): Mechele Claude, [place]   Encounter Date: 03/28/2018  PT End of Session - 03/28/18 1210    Visit Number  47    Number of Visits  75    Date for PT Re-Evaluation  04/24/18    Authorization Type  A[employer]na Medicare & Generic commercial 2nd    Authorization Time Period  Progress Note due visit 28    PT Start Time  1102    PT Stop Time  1145    PT Time Calculation (min)  43 min    Equipment Utilized During Treatment  Gait belt    Activity Tolerance  Patient limited by fatigue    Behavior During Therapy  WFL for tasks assessed/performed       Past Medical History:  Diagnosis Date  . AAA (abdominal aortic aneurysm) ([place])   . Anemia   . Ankle fracture    left  . Aortic regurgitation   . Atherosclerosis   . Complication of anesthesia    CONFUSION - Pt's family very concerned about this  . DDD (degenerative disc disease)   . Degenerative arthritis   . Dermatitis, atopic ARMS AND LEGS  . Diverticulosis   . Erectile dysfunction   . Frequency of urination   . Glaucoma   . History of asbestos exposure   . History of radiation therapy 8/19-13-10/18/11   prostate, 76 GY  . History of shingles 2012-- BILATERAL EYES--  NO RESIDUAL  . Hyperlipidemia   . Hypertension   . Inguinal hernia   . Lumbar scoliosis   . Nocturia   . OA (osteoarthritis) of hip RIGHT  . Prostate cancer ([place]) 05/11/2011   bx=Adenocarcinoma,gleason3+4=7, 4+4=8,PSA=10.30volume=45.7cc  . Renal cyst    bilateral  . Smokers' cough ([place])   . Stroke ([place])   . Thyroid cyst     Past Surgical History:  Procedure Laterality Date  . ATTEMPTED LEFT VATS/ LEFT THORACOTOMY/ RESECTION OF THE ENORMOUS, PROBABLE BRONCHOGENIC CYST  01-04-2005  DR  Arlyce Dice  . CARDIAC CATHETERIZATION  02-24-2009  DR NASHER   MINOR CORONARY ARTERY IRREGULARITIES/ NORMAL LVSF/ EF 65-70%  . CATARACT EXTRACTION    . COLONOSCOPY W/ POLYPECTOMY    . CYSTOSCOPY  11/15/2011   Procedure: CYSTOSCOPY;  Surgeon: Dutch Gray, MD;  Location[facility];  Service: Urology;  Laterality: N/A;  no seeds seen in bladder  . esophageus cyst removal  YRS AGO  . [place]  . ORIF ANKLE FRACTURE Left 05/03/2017  . ORIF ANKLE FRACTURE Left 05/03/2017   Procedure: OPEN REDUCTION INTERNAL FIXATION (ORIF) BIMALLEOLAR  ANKLE FRACTURE;  Surgeon: Wylene Simmer, MD;  Location: [facility];  Service: Orthopedics;  Laterality: Left;  . PACEMAKER IMPLANT N/A 09/16/2017   Procedure: PACEMAKER IMPLANT;  Surgeon: Constance Haw, MD;  Location: [facility];  Service: Cardiovascular;  Laterality: N/A;  . PROSTATE BIOPSY  05/11/11   Adenocarcinoma (MD OFFICE)  . RADIOACTIVE SEED IMPLANT  11/15/2011   Procedure: RADIOACTIVE SEED IMPLANT;  Surgeon: Dutch Gray, MD;  Location: [facility];  Service: Urology;  Laterality: N/A;  Total number of seeds - 52  . REPAIR RIGHT INGUINAL HERNIA W/ MESH  09-10-1999  . TOTAL HIP ARTHROPLASTY Right 03/20/2013   Procedure: RIGHT TOTAL  HIP ARTHROPLASTY ANTERIOR APPROACH;  Surgeon: [name], MD;  [facility];  Service: Orthopedics;  Laterality: Right;  . TOTAL HIP REVISION Right 05/14/2013   Procedure: open reduction internal fixation REVISION RIGHT HIP ;  Surgeon: [name], MD;  [facility];  Service: Orthopedics;  Laterality: Right;  . UMBILICAL HERNIA REPAIR  1998   epigastric    There were no vitals filed for this visit.  Subjective Assessment - 03/28/18 1106    Subjective  No falls. He has been doing his exercises.     Pertinent History  left ankle fracture, TIAs, DDD, Lumbar scoliosis, Glaucoma, cataract surgery, HTN, right THR 03/20/13 with revision 05/14/13, right femur fracture,  dementia/Alzheimers    Limitations  Standing;Walking;House hold activities    Patient Stated Goals  Patient wants to improve his balance & walking    Currently in Pain?  No/denies                            Balance Exercises - 03/28/18 1100      OTAGO PROGRAM   Head Movements  Sitting;5 reps   verbal cues for eyes to look where turning   Neck Movements  Sitting;5 reps   closed chain with pillow behind head   Back Extension  Standing;5 reps   UE support w/ RW   Trunk Movements  Standing;5 reps   one hand on RW; verbal/tactile cues to rotate shoulder   Ankle Movements  Sitting;10 reps    Knee Extensor  10 reps    Knee Flexor  10 reps   RW support   Hip ABductor  10 reps   RW support   Ankle Plantorflexors  20 reps, support   RW   Ankle Dorsiflexors  20 reps, support   RW   Knee Bends  10 reps, support   RW   Backwards Walking  Support   RW   Sideways Walking  Assistive device   counter support   Tandem Stance  10 seconds, support   counter support   Tandem Walk  Support   RW   One Leg Stand  10 seconds, support   intermittent support, modified forefoot in cabinet   Toe Walk  Support   RW   Sit to Stand  5 reps, one support   5 reps R hand support, 5 reps L hand support    Overall OTAGO Comments  seated hamstring stretch with verbal cues to keep chest up; seated calf stretch using gait belt - verbal cues to keep heel on ground and bring toes to knees          PT Short Term Goals - 03/28/18 1226      PT SHORT TERM GOAL #1   Title  Pt will be independent with updated HEP exercises (All STGs Target Date 03/24/2018)    Baseline  03/28/2018 Patient partially understanding but needs further instruction    Status  Partially Met      PT SHORT TERM GOAL #2   Title  Patient and daughter verbalize understanding of community based fitness options including aquatic options.     Baseline  Part of this goal is deferred due to pt and daughter declining  aquatic therapy with pt reporting water is too cold for him - 03-21-18    Status  Deferred   On-going with aquatic HEP deferred due to pt's request     PT SHORT TERM GOAL #3  Title  Timed Up & Go with cane <15sec with supervision.     Baseline  16.87 secs with SBQC with CGA - 03-21-18    Status  Not Met      PT SHORT TERM GOAL #4   Title  Berg Balance >/= 42/56    Baseline  32/56 on 03/24/18    Status  Not Met      PT SHORT TERM GOAL #5   Title  Patient ambulates 100' around furniture carrying cup with cane with supervision.     Baseline  Pt unable to safely ambulate with [facility] with supervision due to increased fall risk due to balance deficits;  performance fluctuates - is inconsistent - 03-21-18    Status  Not Met        PT Long Term Goals - 03/28/18 1228      PT LONG TERM GOAL #1   Title  Pt & family will verbalize & demonstrate independence with ongoing HEP program and fitness plan including returning to aquatic class at [facility] (All LTGs Target Date 04/21/2018)    Time  3    Period  Months    Status  Revised    Target Date  04/21/18      PT LONG TERM GOAL #2   Title  Patient ambulates with supervision 500' outdoors including grass, pavement, curbs, ramps with LRAD to improve community ambulatory skills    Time  3    Period  Months    Status  Revised    Target Date  04/21/18      PT LONG TERM GOAL #3   Title  Berg Balance >/= 45/56 to indicate lower fall risk.     Time  3    Period  Months    Status  On-going    Target Date  04/21/18      PT LONG TERM GOAL #4   Title  Timed Up-Go time with cane <13.5 seconds to indicate lower fall risk.     Time  3    Period  Months    Status  On-going    Target Date  04/21/18      PT LONG TERM GOAL #5   Title  Patient ambulates household activities including negotiating furniture, scanning & carrying light items with cane modified independent.     Time  3    Period  Months    Status  New    Target Date  04/21/18             Plan - 03/28/18 1220    Clinical Impression Statement  Pt appeared to tolerate [place] well. He required verbal and tactile cueing as well as close supervision with exercises. He will require close supervision from family at home when performing some exercises. Having daughter present with his [place] book in future session will be helpful to ensure compliance and saftey with the program at home.     Rehab Potential  Good    Clinical Impairments Affecting Rehab Potential  progressive decline over 2 years, good attitude and family support with 4 children in area.     PT Frequency  2x / week    PT Duration  12 weeks    PT Treatment/Interventions  Gait training;Functional mobility training;Neuromuscular re-education;Balance training;Therapeutic exercise;Therapeutic activities;Patient/family education;ADLs/Self Care Home Management;Stair training;Orthotic Fit/Training;Manual techniques;Vestibular;Canalith Repostioning;Aquatic Therapy    PT Next Visit Plan  Go over [place] with family member present in order to provide instruction and feedback for HEP. Add  cues and instructions to pt's [place] book.     PT Home Exercise Plan  [facility]    Consulted and Agree with Plan of Care  Patient       Patient will benefit from skilled therapeutic intervention in order to improve the following deficits and impairments:  Abnormal gait, Decreased activity tolerance, Decreased balance, Decreased endurance, Decreased knowledge of use of DME, Decreased mobility, Decreased range of motion, Decreased safety awareness, Decreased strength, Impaired flexibility, Postural dysfunction, Dizziness  Visit Diagnosis: Other abnormalities of gait and mobility  Muscle weakness (generalized)  Unsteadiness on feet  Other lack of coordination     Problem List Patient Active Problem List   Diagnosis Date Noted  . Bradycardia 09/16/2017  . CKD (chronic kidney disease), stage III ([place]) 09/15/2017  . Near syncope  09/14/2017  . Post-operative pain   . Dementia without behavioral disturbance ([place])   . TIA (transient ischemic attack)   . Reactive hypertension   . Bimalleolar fracture of left ankle 05/03/2017  . Axonal neuropathy 03/21/2017  . Right sided weakness 03/01/2017  . Cigarette smoker 06/16/2016  . Dyspnea on exertion 06/15/2016  . Alzheimer disease ([place]) 01/07/2014  . CSA (central sleep apnea) 10/05/2013  . Syncope 06/26/2013  . COPD GOLD II 06/25/2013  . BPH (benign prostatic hyperplasia) 05/18/2013  . Glaucoma 05/18/2013  . S/P right TH revision 05/14/2013  . Constipation 03/26/2013  . Osteoporosis 03/26/2013  . S/P right THA, AA 03/20/2013  . Hyperlipidemia   . Erectile dysfunction   . History of asbestos exposure   . Nocturia   . History of shingles   . Dermatitis, atopic   . OA (osteoarthritis) of hip   . Arthritis   . Frequency of urination   . Lumbar scoliosis   . Malignant neoplasm of prostate ([place]) 06/07/2011  . 85 year old gentleman with stage T3 adenocarcinoma prostate with Gleason score 4+4 and PSA of 10.3 05/11/2011   Joaquin Courts, SPT 03/28/2018, 12:30 PM  Jamey Reas, PT, DPT 03/28/2018, 2:03 PM  [facility] [address] [place], [place], [address] Phone: [phone]   Fax:  [phone]  Name: LAURENCE CROFFORD MRN: [id number] Date of Birth: 04/08/1931

## 2018-03-31 ENCOUNTER — Encounter: Payer: Self-pay | Admitting: Physical Therapy

## 2018-03-31 ENCOUNTER — Ambulatory Visit: Payer: Medicare Other | Admitting: Physical Therapy

## 2018-03-31 DIAGNOSIS — M79605 Pain in left leg: Secondary | ICD-10-CM | POA: Diagnosis not present

## 2018-03-31 DIAGNOSIS — R262 Difficulty in walking, not elsewhere classified: Secondary | ICD-10-CM | POA: Diagnosis not present

## 2018-03-31 DIAGNOSIS — M25672 Stiffness of left ankle, not elsewhere classified: Secondary | ICD-10-CM | POA: Diagnosis not present

## 2018-03-31 DIAGNOSIS — R42 Dizziness and giddiness: Secondary | ICD-10-CM | POA: Diagnosis not present

## 2018-03-31 DIAGNOSIS — R2689 Other abnormalities of gait and mobility: Secondary | ICD-10-CM

## 2018-03-31 DIAGNOSIS — R2681 Unsteadiness on feet: Secondary | ICD-10-CM

## 2018-03-31 DIAGNOSIS — M6281 Muscle weakness (generalized): Secondary | ICD-10-CM | POA: Diagnosis not present

## 2018-03-31 DIAGNOSIS — R278 Other lack of coordination: Secondary | ICD-10-CM | POA: Diagnosis not present

## 2018-03-31 NOTE — Therapy (Signed)
[facility] [address] [place], [place], [address] Phone: [phone]   Fax:  [phone]  Physical Therapy Treatment  Patient Details  Name: Derek Carr MRN: [id number] Date of Birth: September 10, 1931 Referring Provider (PT): Mechele Claude, [place]   Encounter Date: 03/31/2018  PT End of Session - 03/31/18 1320    Visit Number  65    Number of Visits  32    Date for PT Re-Evaluation  04/24/18    Authorization Type  Aetna Medicare & Generic commercial 2nd    Authorization Time Period  Progress Note due visit 5    PT Start Time  1318    PT Stop Time  1400    PT Time Calculation (min)  42 min    Equipment Utilized During Treatment  Gait belt    Activity Tolerance  Patient limited by fatigue    Behavior During Therapy  WFL for tasks assessed/performed       Past Medical History:  Diagnosis Date  . AAA (abdominal aortic aneurysm) ([place])   . Anemia   . Ankle fracture    left  . Aortic regurgitation   . Atherosclerosis   . Complication of anesthesia    CONFUSION - Pt's family very concerned about this  . DDD (degenerative disc disease)   . Degenerative arthritis   . Dermatitis, atopic ARMS AND LEGS  . Diverticulosis   . Erectile dysfunction   . Frequency of urination   . Glaucoma   . History of asbestos exposure   . History of radiation therapy 8/19-13-10/18/11   prostate, 57 GY  . History of shingles 2012-- BILATERAL EYES--  NO RESIDUAL  . Hyperlipidemia   . Hypertension   . Inguinal hernia   . Lumbar scoliosis   . Nocturia   . OA (osteoarthritis) of hip RIGHT  . Prostate cancer ([place]) 05/11/2011   bx=Adenocarcinoma,gleason3+4=7, 4+4=8,PSA=10.30volume=45.7cc  . Renal cyst    bilateral  . Smokers' cough ([place])   . Stroke ([place])   . Thyroid cyst     Past Surgical History:  Procedure Laterality Date  . ATTEMPTED LEFT VATS/ LEFT THORACOTOMY/ RESECTION OF THE ENORMOUS, PROBABLE BRONCHOGENIC CYST  01-04-2005  DR  Arlyce Dice  . CARDIAC CATHETERIZATION  02-24-2009  DR NASHER   MINOR CORONARY ARTERY IRREGULARITIES/ NORMAL LVSF/ EF 65-70%  . CATARACT EXTRACTION    . COLONOSCOPY W/ POLYPECTOMY    . CYSTOSCOPY  11/15/2011   Procedure: CYSTOSCOPY;  Surgeon: Dutch Gray, MD;  Location: [facility];  Service: Urology;  Laterality: N/A;  no seeds seen in bladder  . esophageus cyst removal  YRS AGO  . [place]  . ORIF ANKLE FRACTURE Left 05/03/2017  . ORIF ANKLE FRACTURE Left 05/03/2017   Procedure: OPEN REDUCTION INTERNAL FIXATION (ORIF) BIMALLEOLAR  ANKLE FRACTURE;  Surgeon: Wylene Simmer, MD;  Location: [facility];  Service: Orthopedics;  Laterality: Left;  . PACEMAKER IMPLANT N/A 09/16/2017   Procedure: PACEMAKER IMPLANT;  Surgeon: Constance Haw, MD;  Location: [facility];  Service: Cardiovascular;  Laterality: N/A;  . PROSTATE BIOPSY  05/11/11   Adenocarcinoma ([facility] OFFICE)  . RADIOACTIVE SEED IMPLANT  11/15/2011   Procedure: RADIOACTIVE SEED IMPLANT;  Surgeon: Dutch Gray, MD;  Location: [facility];  Service: Urology;  Laterality: N/A;  Total number of seeds - 52  . REPAIR RIGHT INGUINAL HERNIA W/ MESH  09-10-1999  . TOTAL HIP ARTHROPLASTY Right 03/20/2013   Procedure: RIGHT TOTAL  HIP ARTHROPLASTY ANTERIOR APPROACH;  Surgeon: [name], MD;  [facility];  Service: Orthopedics;  Laterality: Right;  . TOTAL HIP REVISION Right 05/14/2013   Procedure: open reduction internal fixation REVISION RIGHT HIP ;  Surgeon: [name], MD;  [facility];  Service: Orthopedics;  Laterality: Right;  . UMBILICAL HERNIA REPAIR  1998   epigastric    There were no vitals filed for this visit.  Subjective Assessment - 03/31/18 1318    Subjective  No falls. Reports the neuropathy in his feet was bad yesterday, better today.     Patient is accompained by:  Family member   daughter [name]   Pertinent History  left ankle fracture, TIAs, DDD, Lumbar scoliosis,  Glaucoma, cataract surgery, HTN, right THR 03/20/13 with revision 05/14/13, right femur fracture, dementia/Alzheimers    Limitations  Standing;Walking;House hold activities    Patient Stated Goals  Patient wants to improve his balance & walking    Currently in Pain?  Yes    Pain Score  --   upable to rate pain   Pain Location  Foot    Pain Orientation  Right;Left    Pain Descriptors / Indicators  Aching;Burning    Pain Type  Neuropathic pain    Pain Onset  Yesterday    Pain Frequency  Intermittent    Aggravating Factors   unsure    Pain Relieving Factors  unsure            Balance Exercises - 03/31/18 1321      OTAGO PROGRAM   Head Movements  Sitting;5 reps    Neck Movements  Sitting;5 reps    Back Extension  Standing;5 reps    Trunk Movements  Standing;5 reps    Ankle Movements  Sitting;10 reps    Knee Extensor  10 reps;Weight (comment)   2#   Knee Flexor  10 reps;Weight (comment)   2#   Hip ABductor  10 reps;Weight (comment)   2#   Ankle Plantorflexors  20 reps, support    Ankle Dorsiflexors  20 reps, support    Knee Bends  10 reps, support    Backwards Walking  Support    Sideways Walking  Assistive device    Tandem Stance  10 seconds, support    Tandem Walk  Support    One Leg Stand  10 seconds, support    Toe Walk  Support    Sit to Stand  5 reps, one support    Overall OTAGO Comments  use RW/counter/HHA with all standing ex's. Pt's booklet updated with cues and daughter educated on cues/assist to provide. also included seated hamstring stretch in OTAGO program.           PT Short Term Goals - 03/28/18 1226      PT SHORT TERM GOAL #1   Title  Pt will be independent with updated HEP exercises (All STGs Target Date 03/24/2018)    Baseline  03/28/2018 Patient partially understanding but needs further instruction    Status  Partially Met      PT SHORT TERM GOAL #2   Title  Patient and daughter verbalize understanding of community based fitness options  including aquatic options.     Baseline  Part of this goal is deferred due to pt and daughter declining aquatic therapy with pt reporting water is too cold for him - 03-21-18    Status  Deferred   On-going with aquatic HEP deferred due to pt's request  PT SHORT TERM GOAL #3   Title  Timed Up & Go with cane <15sec with supervision.     Baseline  16.87 secs with SBQC with CGA - 03-21-18    Status  Not Met      PT SHORT TERM GOAL #4   Title  Berg Balance >/= 42/56    Baseline  32/56 on 03/24/18    Status  Not Met      PT SHORT TERM GOAL #5   Title  Patient ambulates 100' around furniture carrying cup with cane with supervision.     Baseline  Pt unable to safely ambulate with [facility] with supervision due to increased fall risk due to balance deficits;  performance fluctuates - is inconsistent - 03-21-18    Status  Not Met        PT Long Term Goals - 03/28/18 1228      PT LONG TERM GOAL #1   Title  Pt & family will verbalize & demonstrate independence with ongoing HEP program and fitness plan including returning to aquatic class at [facility] (All LTGs Target Date 04/21/2018)    Time  3    Period  Months    Status  Revised    Target Date  04/21/18      PT LONG TERM GOAL #2   Title  Patient ambulates with supervision 500' outdoors including grass, pavement, curbs, ramps with LRAD to improve community ambulatory skills    Time  3    Period  Months    Status  Revised    Target Date  04/21/18      PT LONG TERM GOAL #3   Title  Berg Balance >/= 45/56 to indicate lower fall risk.     Time  3    Period  Months    Status  On-going    Target Date  04/21/18      PT LONG TERM GOAL #4   Title  Timed Up-Go time with cane <13.5 seconds to indicate lower fall risk.     Time  3    Period  Months    Status  On-going    Target Date  04/21/18      PT LONG TERM GOAL #5   Title  Patient ambulates household activities including negotiating furniture, scanning & carrying light items with cane  modified independent.     Time  3    Period  Months    Status  New    Target Date  04/21/18            Plan - 03/31/18 1812    Clinical Impression Statement  Today's skilled session focused on review of [place] program with daughter present. Pt's personal book updated with cues needed for correct form/technique and assist needed with each exercise.                  Rehab Potential  Good    Clinical Impairments Affecting Rehab Potential  progressive decline over 2 years, good attitude and family support with 4 children in area.     PT Frequency  2x / week    PT Duration  12 weeks    PT Treatment/Interventions  Gait training;Functional mobility training;Neuromuscular re-education;Balance training;Therapeutic exercise;Therapeutic activities;Patient/family education;ADLs/Self Care Home Management;Stair training;Orthotic Fit/Training;Manual techniques;Vestibular;Canalith Repostioning;Aquatic Therapy    PT Next Visit Plan  continue toward [place] and Agree with Plan of Care  Patient  Patient will benefit from skilled therapeutic intervention in order to improve the following deficits and impairments:  Abnormal gait, Decreased activity tolerance, Decreased balance, Decreased endurance, Decreased knowledge of use of DME, Decreased mobility, Decreased range of motion, Decreased safety awareness, Decreased strength, Impaired flexibility, Postural dysfunction, Dizziness  Visit Diagnosis: Other abnormalities of gait and mobility  Unsteadiness on feet  Muscle weakness (generalized)     Problem List Patient Active Problem List   Diagnosis Date Noted  . Bradycardia 09/16/2017  . CKD (chronic kidney disease), stage III ([place]) 09/15/2017  . Near syncope 09/14/2017  . Post-operative pain   . Dementia without behavioral disturbance ([place])   . TIA (transient ischemic attack)   . Reactive hypertension   . Bimalleolar fracture of left ankle  05/03/2017  . Axonal neuropathy 03/21/2017  . Right sided weakness 03/01/2017  . Cigarette smoker 06/16/2016  . Dyspnea on exertion 06/15/2016  . Alzheimer disease ([place]) 01/07/2014  . CSA (central sleep apnea) 10/05/2013  . Syncope 06/26/2013  . COPD GOLD II 06/25/2013  . BPH (benign prostatic hyperplasia) 05/18/2013  . Glaucoma 05/18/2013  . S/P right TH revision 05/14/2013  . Constipation 03/26/2013  . Osteoporosis 03/26/2013  . S/P right THA, AA 03/20/2013  . Hyperlipidemia   . Erectile dysfunction   . History of asbestos exposure   . Nocturia   . History of shingles   . Dermatitis, atopic   . OA (osteoarthritis) of hip   . Arthritis   . Frequency of urination   . Lumbar scoliosis   . Malignant neoplasm of prostate ([place]) 06/07/2011  . 85 year old gentleman with stage T3 adenocarcinoma prostate with Gleason score 4+4 and PSA of 10.3 05/11/2011    Willow Ora, PTA, [facility] [address], [place],  [address] [phone] 03/31/18, 6:42 PM   Name: Derek Carr MRN: [id number] Date of Birth: [birth year]

## 2018-04-01 ENCOUNTER — Other Ambulatory Visit: Payer: Self-pay | Admitting: Neurology

## 2018-04-03 ENCOUNTER — Ambulatory Visit (INDEPENDENT_AMBULATORY_CARE_PROVIDER_SITE_OTHER): Payer: Medicare HMO | Admitting: *Deleted

## 2018-04-03 DIAGNOSIS — R001 Bradycardia, unspecified: Secondary | ICD-10-CM

## 2018-04-03 DIAGNOSIS — I4589 Other specified conduction disorders: Secondary | ICD-10-CM

## 2018-04-04 ENCOUNTER — Ambulatory Visit: Payer: Medicare Other | Admitting: Physical Therapy

## 2018-04-04 ENCOUNTER — Encounter: Payer: Self-pay | Admitting: Podiatry

## 2018-04-04 ENCOUNTER — Ambulatory Visit: Payer: Medicare HMO | Admitting: Podiatry

## 2018-04-04 ENCOUNTER — Ambulatory Visit: Payer: Medicare HMO | Admitting: Neurology

## 2018-04-04 ENCOUNTER — Other Ambulatory Visit: Payer: Self-pay | Admitting: Neurology

## 2018-04-04 VITALS — BP 140/89 | HR 67 | Resp 16

## 2018-04-04 DIAGNOSIS — R262 Difficulty in walking, not elsewhere classified: Secondary | ICD-10-CM | POA: Diagnosis not present

## 2018-04-04 DIAGNOSIS — B351 Tinea unguium: Secondary | ICD-10-CM

## 2018-04-04 DIAGNOSIS — M79605 Pain in left leg: Secondary | ICD-10-CM

## 2018-04-04 DIAGNOSIS — R42 Dizziness and giddiness: Secondary | ICD-10-CM

## 2018-04-04 DIAGNOSIS — R278 Other lack of coordination: Secondary | ICD-10-CM

## 2018-04-04 DIAGNOSIS — M6281 Muscle weakness (generalized): Secondary | ICD-10-CM

## 2018-04-04 DIAGNOSIS — R2681 Unsteadiness on feet: Secondary | ICD-10-CM | POA: Diagnosis not present

## 2018-04-04 DIAGNOSIS — R2689 Other abnormalities of gait and mobility: Secondary | ICD-10-CM

## 2018-04-04 DIAGNOSIS — M79676 Pain in unspecified toe(s): Secondary | ICD-10-CM

## 2018-04-04 DIAGNOSIS — M25672 Stiffness of left ankle, not elsewhere classified: Secondary | ICD-10-CM

## 2018-04-04 LAB — CUP PACEART REMOTE DEVICE CHECK
Battery Remaining Longevity: 163 mo
Battery Voltage: 3.14 V
Brady Statistic AP VP Percent: 0.02 %
Brady Statistic AP VS Percent: 75.22 %
Brady Statistic AS VP Percent: 0.02 %
Brady Statistic AS VS Percent: 24.74 %
Brady Statistic RA Percent Paced: 75 %
Brady Statistic RV Percent Paced: 0.04 %
Date Time Interrogation Session: 20200309100237
Implantable Lead Implant Date: 20190823
Implantable Lead Implant Date: 20190823
Implantable Lead Location: 753859
Implantable Lead Location: 753860
Implantable Lead Model: 5076
Implantable Lead Model: 5076
Implantable Pulse Generator Implant Date: 20190823
Lead Channel Impedance Value: 323 Ohm
Lead Channel Impedance Value: 342 Ohm
Lead Channel Impedance Value: 418 Ohm
Lead Channel Impedance Value: 437 Ohm
Lead Channel Pacing Threshold Amplitude: 0.625 V
Lead Channel Pacing Threshold Pulse Width: 0.4 ms
Lead Channel Pacing Threshold Pulse Width: 0.4 ms
Lead Channel Sensing Intrinsic Amplitude: 12.25 mV
Lead Channel Sensing Intrinsic Amplitude: 12.25 mV
Lead Channel Sensing Intrinsic Amplitude: 2.625 mV
Lead Channel Setting Pacing Amplitude: 1.5 V
Lead Channel Setting Pacing Amplitude: 2.5 V
Lead Channel Setting Pacing Pulse Width: 0.4 ms
Lead Channel Setting Sensing Sensitivity: 1.2 mV
MDC IDC MSMT LEADCHNL RA SENSING INTR AMPL: 2.625 mV
MDC IDC MSMT LEADCHNL RV PACING THRESHOLD AMPLITUDE: 0.875 V

## 2018-04-04 NOTE — Therapy (Signed)
[facility] [address] [place], [place], [address] Phone: [phone]   Fax:  [phone]  Physical Therapy Treatment  Patient Details  Name: Derek Carr MRN: [id number] Date of Birth: Jun 05, 1931 Referring Provider (PT): Mechele Claude, [place]   Encounter Date: 04/04/2018  PT End of Session - 04/04/18 1407    Visit Number  87    Number of Visits  75    Date for PT Re-Evaluation  04/24/18    Authorization Type  [employer] Medicare & Generic commercial 2nd    Authorization Time Period  Progress Note due visit 57    PT Start Time  1320    PT Stop Time  1400    PT Time Calculation (min)  40 min    Equipment Utilized During Treatment  Gait belt    Activity Tolerance  Patient limited by fatigue    Behavior During Therapy  WFL for tasks assessed/performed       Past Medical History:  Diagnosis Date  . AAA (abdominal aortic aneurysm) ([place])   . Anemia   . Ankle fracture    left  . Aortic regurgitation   . Atherosclerosis   . Complication of anesthesia    CONFUSION - Pt's family very concerned about this  . DDD (degenerative disc disease)   . Degenerative arthritis   . Dermatitis, atopic ARMS AND LEGS  . Diverticulosis   . Erectile dysfunction   . Frequency of urination   . Glaucoma   . History of asbestos exposure   . History of radiation therapy 8/19-13-10/18/11   prostate, 89 GY  . History of shingles 2012-- BILATERAL EYES--  NO RESIDUAL  . Hyperlipidemia   . Hypertension   . Inguinal hernia   . Lumbar scoliosis   . Nocturia   . OA (osteoarthritis) of hip RIGHT  . Prostate cancer ([place]) 05/11/2011   bx=Adenocarcinoma,gleason3+4=7, 4+4=8,PSA=10.30volume=45.7cc  . Renal cyst    bilateral  . Smokers' cough ([place])   . Stroke ([place])   . Thyroid cyst     Past Surgical History:  Procedure Laterality Date  . ATTEMPTED LEFT VATS/ LEFT THORACOTOMY/ RESECTION OF THE ENORMOUS, PROBABLE BRONCHOGENIC CYST  01-04-2005  DR  Arlyce Dice  . CARDIAC CATHETERIZATION  02-24-2009  DR NASHER   MINOR CORONARY ARTERY IRREGULARITIES/ NORMAL LVSF/ EF 65-70%  . CATARACT EXTRACTION    . COLONOSCOPY W/ POLYPECTOMY    . CYSTOSCOPY  11/15/2011   Procedure: CYSTOSCOPY;  Surgeon: Dutch Gray, MD;  Location: [facility];  Service: Urology;  Laterality: N/A;  no seeds seen in bladder  . esophageus cyst removal  YRS AGO  . [place]  . ORIF ANKLE FRACTURE Left 05/03/2017  . ORIF ANKLE FRACTURE Left 05/03/2017   Procedure: OPEN REDUCTION INTERNAL FIXATION (ORIF) BIMALLEOLAR  ANKLE FRACTURE;  Surgeon: Wylene Simmer, MD;  Location: [facility];  Service: Orthopedics;  Laterality: Left;  . PACEMAKER IMPLANT N/A 09/16/2017   Procedure: PACEMAKER IMPLANT;  Surgeon: Constance Haw, MD;  Location: [facility];  Service: Cardiovascular;  Laterality: N/A;  . PROSTATE BIOPSY  05/11/11   Adenocarcinoma (MD OFFICE)  . RADIOACTIVE SEED IMPLANT  11/15/2011   Procedure: RADIOACTIVE SEED IMPLANT;  Surgeon: Dutch Gray, MD;  Location: [facility];  Service: Urology;  Laterality: N/A;  Total number of seeds - 52  . REPAIR RIGHT INGUINAL HERNIA W/ MESH  09-10-1999  . TOTAL HIP ARTHROPLASTY Right 03/20/2013   Procedure: RIGHT TOTAL  HIP ARTHROPLASTY ANTERIOR APPROACH;  Surgeon: [name], MD;  [facility]: [facility];  Service: Orthopedics;  Laterality: Right;  . TOTAL HIP REVISION Right 05/14/2013   Procedure: open reduction internal fixation REVISION RIGHT HIP ;  Surgeon: [name], MD;  Location: [facility];  Service: Orthopedics;  Laterality: Right;  . UMBILICAL HERNIA REPAIR  1998   epigastric    There were no vitals filed for this visit.  Subjective Assessment - 04/04/18 1320    Subjective  No falls or pain. Saw podiatrist this morning - got toenails trimmed; no issues. Reports doing exercises everyday with no issues.    Currently in Pain?  No/denies                             Balance Exercises - 04/04/18 1323      OTAGO PROGRAM   Head Movements  Sitting;5 reps    Neck Movements  Sitting;5 reps   closed chain with pillow behind head    Back Extension  Standing;5 reps   UE support w/ walker   Trunk Movements  Standing;5 reps   one hand on RW, verbal cues to rotate shoulder    Ankle Movements  Sitting;10 reps    Knee Extensor  10 reps    Knee Flexor  10 reps    Hip ABductor  10 reps    Ankle Plantorflexors  20 reps, support    Ankle Dorsiflexors  20 reps, support    Knee Bends  10 reps, support    Backwards Walking  Support    Sideways Walking  Assistive device    Tandem Stance  10 seconds, support    Tandem Walk  Support    One Leg Stand  10 seconds, support    Heel Walking  Support    Toe Walk  Support    Sit to Stand  5 reps, one support   alternating R and L hands, counter in front of chair    Overall OTAGO Comments  use RW/counter/HHA for all standing exercises. Updated book with comments for daughter.         PT Education - 04/04/18 1406    Education provided  Yes    Education Details  reviewed safety and technique with [employer]) Educated  Patient    Methods  Explanation;Handout    Comprehension  Verbalized understanding       PT Short Term Goals - 03/28/18 1226      PT SHORT TERM GOAL #1   Title  Pt will be independent with updated HEP exercises (All STGs Target Date 03/24/2018)    Baseline  03/28/2018 Patient partially understanding but needs further instruction    Status  Partially Met      PT SHORT TERM GOAL #2   Title  Patient and daughter verbalize understanding of community based fitness options including aquatic options.     Baseline  Part of this goal is deferred due to pt and daughter declining aquatic therapy with pt reporting water is too cold for him - 03-21-18    Status  Deferred   On-going with aquatic HEP deferred due to pt's request     PT SHORT TERM GOAL  #3   Title  Timed Up & Go with cane <15sec with supervision.     Baseline  16.87 secs with SBQC with CGA - 03-21-18    Status  Not Met  PT SHORT TERM GOAL #4   Title  Berg Balance >/= 42/56    Baseline  32/56 on 03/24/18    Status  Not Met      PT SHORT TERM GOAL #5   Title  Patient ambulates 100' around furniture carrying cup with cane with supervision.     Baseline  Pt unable to safely ambulate with [facility] with supervision due to increased fall risk due to balance deficits;  performance fluctuates - is inconsistent - 03-21-18    Status  Not Met        PT Long Term Goals - 03/28/18 1228      PT LONG TERM GOAL #1   Title  Pt & family will verbalize & demonstrate independence with ongoing HEP program and fitness plan including returning to aquatic class at [facility] (All LTGs Target Date 04/21/2018)    Time  3    Period  Months    Status  Revised    Target Date  04/21/18      PT LONG TERM GOAL #2   Title  Patient ambulates with supervision 500' outdoors including grass, pavement, curbs, ramps with LRAD to improve community ambulatory skills    Time  3    Period  Months    Status  Revised    Target Date  04/21/18      PT LONG TERM GOAL #3   Title  Berg Balance >/= 45/56 to indicate lower fall risk.     Time  3    Period  Months    Status  On-going    Target Date  04/21/18      PT LONG TERM GOAL #4   Title  Timed Up-Go time with cane <13.5 seconds to indicate lower fall risk.     Time  3    Period  Months    Status  On-going    Target Date  04/21/18      PT LONG TERM GOAL #5   Title  Patient ambulates household activities including negotiating furniture, scanning & carrying light items with cane modified independent.     Time  3    Period  Months    Status  New    Target Date  04/21/18            Plan - 04/04/18 1408    Clinical Impression Statement  Treatment session focusedon review of OTAGO program. Daughter was not present for today's session but  further comments were left in exercise book as to which exercises require family supervision to perform.     Rehab Potential  Good    Clinical Impairments Affecting Rehab Potential  progressive decline over 2 years, good attitude and family support with 4 children in area.     PT Frequency  2x / week    PT Duration  12 weeks    PT Treatment/Interventions  Gait training;Functional mobility training;Neuromuscular re-education;Balance training;Therapeutic exercise;Therapeutic activities;Patient/family education;ADLs/Self Care Home Management;Stair training;Orthotic Fit/Training;Manual techniques;Vestibular;Canalith Repostioning;Aquatic Therapy    PT Next Visit Plan  continue toward LTGs, balance training, Otago review     PT Home [place] and Agree with Plan of Care  Patient       Patient will benefit from skilled therapeutic intervention in order to improve the following deficits and impairments:  Abnormal gait, Decreased activity tolerance, Decreased balance, Decreased endurance, Decreased knowledge of use of DME, Decreased mobility, Decreased range of motion, Decreased safety awareness, Decreased strength,  Impaired flexibility, Postural dysfunction, Dizziness  Visit Diagnosis: Other abnormalities of gait and mobility  Unsteadiness on feet  Muscle weakness (generalized)  Other lack of coordination  Dizziness and giddiness  Difficulty in walking, not elsewhere classified  Stiffness of left ankle, not elsewhere classified  Pain in left leg     Problem List Patient Active Problem List   Diagnosis Date Noted  . Bradycardia 09/16/2017  . CKD (chronic kidney disease), stage III ([place]) 09/15/2017  . Near syncope 09/14/2017  . Post-operative pain   . Dementia without behavioral disturbance ([place])   . TIA (transient ischemic attack)   . Reactive hypertension   . Bimalleolar fracture of left ankle 05/03/2017  . Axonal neuropathy 03/21/2017  . Right sided  weakness 03/01/2017  . Cigarette smoker 06/16/2016  . Dyspnea on exertion 06/15/2016  . Alzheimer disease ([place]) 01/07/2014  . CSA (central sleep apnea) 10/05/2013  . Syncope 06/26/2013  . COPD GOLD II 06/25/2013  . BPH (benign prostatic hyperplasia) 05/18/2013  . Glaucoma 05/18/2013  . S/P right TH revision 05/14/2013  . Constipation 03/26/2013  . Osteoporosis 03/26/2013  . S/P right THA, AA 03/20/2013  . Hyperlipidemia   . Erectile dysfunction   . History of asbestos exposure   . Nocturia   . History of shingles   . Dermatitis, atopic   . OA (osteoarthritis) of hip   . Arthritis   . Frequency of urination   . Lumbar scoliosis   . Malignant neoplasm of prostate ([place]) 06/07/2011  . 85 year old gentleman with stage T3 adenocarcinoma prostate with Gleason score 4+4 and PSA of 10.3 05/11/2011    Joaquin Courts, SPT 04/04/2018, 2:11 PM  [facility] [address] [place], [place], [address] Phone: [phone]   Fax:  [phone]  Name: JANATHAN BRIBIESCA MRN: [id number] Date of Birth: 05-14-1931

## 2018-04-04 NOTE — Progress Notes (Signed)
Subjective:  Patient ID: Derek Carr, male    DOB: 12/01/1931,  MRN: [id number] HPI Chief Complaint  Patient presents with  . Foot Pain    Patient presents with his daughter and she states he has numbness in feet, but no pain, tingling or burning, concerned about skin peeling on feet and legs and the appearance of the toenails  . New Patient (Initial Visit)    Est pt 2016    85 y.o. male presents with the above complaint.   ROS: Denies fever chills nausea vomiting muscle aches pains calf pain back pain chest pain shortness of breath.  Past Medical History:  Diagnosis Date  . AAA (abdominal aortic aneurysm) ([place])   . Anemia   . Ankle fracture    left  . Aortic regurgitation   . Atherosclerosis   . Complication of anesthesia    CONFUSION - Pt's family very concerned about this  . DDD (degenerative disc disease)   . Degenerative arthritis   . Dermatitis, atopic ARMS AND LEGS  . Diverticulosis   . Erectile dysfunction   . Frequency of urination   . Glaucoma   . History of asbestos exposure   . History of radiation therapy 8/19-13-10/18/11   prostate, 46 GY  . History of shingles 2012-- BILATERAL EYES--  NO RESIDUAL  . Hyperlipidemia   . Hypertension   . Inguinal hernia   . Lumbar scoliosis   . Nocturia   . OA (osteoarthritis) of hip RIGHT  . Prostate cancer ([place]) 05/11/2011   bx=Adenocarcinoma,gleason3+4=7, 4+4=8,PSA=10.30volume=45.7cc  . Renal cyst    bilateral  . Smokers' cough ([place])   . Stroke ([place])   . Thyroid cyst    Past Surgical History:  Procedure Laterality Date  . ATTEMPTED LEFT VATS/ LEFT THORACOTOMY/ RESECTION OF THE ENORMOUS, PROBABLE BRONCHOGENIC CYST  01-04-2005  DR Arlyce Dice  . CARDIAC CATHETERIZATION  02-24-2009  DR NASHER   MINOR CORONARY ARTERY IRREGULARITIES/ NORMAL LVSF/ EF 65-70%  . CATARACT EXTRACTION    . COLONOSCOPY W/ POLYPECTOMY    . CYSTOSCOPY  11/15/2011   Procedure: CYSTOSCOPY;  Surgeon: Dutch Gray, MD;  Location: [facility];  Service: Urology;  Laterality: N/A;  no seeds seen in bladder  . esophageus cyst removal  YRS AGO  . [place]  . ORIF ANKLE FRACTURE Left 05/03/2017  . ORIF ANKLE FRACTURE Left 05/03/2017   Procedure: OPEN REDUCTION INTERNAL FIXATION (ORIF) BIMALLEOLAR  ANKLE FRACTURE;  Surgeon: Wylene Simmer, MD;  Location: [facility];  Service: Orthopedics;  Laterality: Left;  . PACEMAKER IMPLANT N/A 09/16/2017   Procedure: PACEMAKER IMPLANT;  Surgeon: Constance Haw, MD;  Location: [facility];  Service: Cardiovascular;  Laterality: N/A;  . PROSTATE BIOPSY  05/11/11   Adenocarcinoma ([facility] OFFICE)  . RADIOACTIVE SEED IMPLANT  11/15/2011   Procedure: RADIOACTIVE SEED IMPLANT;  Surgeon: Dutch Gray, MD;  Location: [facility];  Service: Urology;  Laterality: N/A;  Total number of seeds - 52  . REPAIR RIGHT INGUINAL HERNIA W/ MESH  09-10-1999  . TOTAL HIP ARTHROPLASTY Right 03/20/2013   Procedure: RIGHT TOTAL HIP ARTHROPLASTY ANTERIOR APPROACH;  Surgeon: [name], MD;  [facility];  Service: Orthopedics;  Laterality: Right;  . TOTAL HIP REVISION Right 05/14/2013   Procedure: open reduction internal fixation REVISION RIGHT HIP ;  Surgeon: [name], MD;  [facility];  Service: Orthopedics;  Laterality: Right;  . [place]  epigastric    Current Outpatient Medications:  .  acetaminophen (TYLENOL) 500 MG tablet, Take 1,000 mg by mouth 2 (two) times daily. , Disp: , Rfl:  .  Ascorbic Acid (VITAMIN C) 1000 MG tablet, Take 1,000 mg by mouth daily., Disp: , Rfl:  .  aspirin EC 81 MG tablet, Take 81 mg by mouth daily., Disp: , Rfl:  .  Calcium Carbonate Antacid (TUMS PO), Take 1 tablet by mouth daily., Disp: , Rfl:  .  cetirizine (ZYRTEC) 10 MG tablet, Take 10 mg by mouth daily., Disp: , Rfl:  .  Cholecalciferol (VITAMIN D-3) 1000 units CAPS, Take 1 capsule by mouth 2 (two) times daily. , Disp: , Rfl:  .  clobetasol cream (TEMOVATE)  6.01 %, Apply 1 application topically daily at 12 noon., Disp: , Rfl:  .  Cyanocobalamin (VITAMIN B12 PO), Take 1 tablet by mouth daily. 5000 daily, Disp: , Rfl:  .  Docosahexaenoic Acid (DHA OMEGA 3 PO), Take 1,000 mg by mouth daily. , Disp: , Rfl:  .  donepezil (ARICEPT) 10 MG tablet, Take 10 mg by mouth at bedtime. , Disp: , Rfl:  .  Fluticasone-Umeclidin-Vilant (TRELEGY ELLIPTA) 100-62.5-25 MCG/INH AEPB, Inhale 1 puff into the lungs daily., Disp: 14 each, Rfl: 0 .  folic acid (FOLVITE) 1 MG tablet, Take 1 mg by mouth daily., Disp: , Rfl:  .  irbesartan (AVAPRO) 75 MG tablet, Take 75 mg by mouth at bedtime. **REPLACES VALSARTAN**, Disp: , Rfl: 11 .  metoprolol succinate (TOPROL XL) 25 MG 24 hr tablet, Take 1 tablet (25 mg total) by mouth daily., Disp: 90 tablet, Rfl: 3 .  MYRBETRIQ 50 MG TB24 tablet, , Disp: , Rfl:  .  Omega 3-6-9 Fatty Acids (OMEGA DHA PO), Take 1 tablet by mouth at bedtime. , Disp: , Rfl:  .  Probiotic Product (PROBIOTIC PO), Take 1 capsule by mouth daily. , Disp: , Rfl:  .  topiramate (TOPAMAX) 50 MG tablet, Take 0.5 tablets (25 mg total) by mouth daily., Disp: 30 tablet, Rfl: 3 .  Ubiquinol (QUNOL COQ10/UBIQUINOL/MEGA) 100 MG CAPS, Take 1 tablet by mouth at bedtime. , Disp: , Rfl:  .  umeclidinium-vilanterol (ANORO ELLIPTA) 62.5-25 MCG/INH AEPB, Only open the device one time and then take your two separate drags to be sure you get it all, Disp: 1 each, Rfl: 11 .  UNABLE TO FIND, Med Name: CPAP per Dr Beacher May, Disp: , Rfl:  .  valACYclovir (VALTREX) 1000 MG tablet, Take 1,000 mg by mouth daily., Disp: , Rfl:   Allergies  Allergen Reactions  . Oxycodone Other (See Comments)  . Dilaudid [Hydromorphone Hcl] Other (See Comments)    Confusion   . Methocarbamol Other (See Comments)    Drowsiness   . Percodan [Oxycodone-Aspirin] Other (See Comments)    Confusion   . Tramadol Other (See Comments)    Drowsiness   . Vicodin [Hydrocodone-Acetaminophen] Other (See Comments)     Confusion    Review of Systems Objective:   Vitals:   04/04/18 1056  BP: 140/89  Pulse: 67  Resp: 16    General: Well developed, nourished, in no acute distress, alert and oriented x3   Dermatological: Skin is warm, dry and supple bilateral. Nails x 10 are well maintained; remaining integument appears unremarkable at this time. There are no open sores, no preulcerative lesions, no rash or signs of infection present.  Nails are long thick yellow dystrophic onychomycotic dry xerotic skin bilaterally.  Vascular: Dorsalis Pedis artery and  Posterior Tibial artery pedal pulses are 2/4 bilateral with immedate capillary fill time. Pedal hair growth present. No varicosities and no lower extremity edema present bilateral.   Neruologic: Grossly intact via light touch bilateral. Vibratory intact via tuning fork bilateral. Protective threshold with Semmes Wienstein monofilament intact to all pedal sites bilateral. Patellar and Achilles deep tendon reflexes 2+ bilateral. No Babinski or clonus noted bilateral.   Musculoskeletal: No gross boney pedal deformities bilateral. No pain, crepitus, or limitation noted with foot and ankle range of motion bilateral. Muscular strength 5/5 in all groups tested bilateral.  Gait: Unassisted, Nonantalgic.    Radiographs:  None taken  Assessment & Plan:   Assessment: Toenails are long and dystrophic.  Idiopathic neuropathy bilateral.  Plan: Discussed etiology pathology and surgical therapies debrided all reactive hyperkeratotic tissue.  Debrided toenails 1 through 5 bilateral.     Max T. [place], [place]

## 2018-04-06 ENCOUNTER — Ambulatory Visit (INDEPENDENT_AMBULATORY_CARE_PROVIDER_SITE_OTHER): Payer: Medicare HMO | Admitting: Neurology

## 2018-04-06 ENCOUNTER — Other Ambulatory Visit: Payer: Self-pay

## 2018-04-06 ENCOUNTER — Encounter: Payer: Self-pay | Admitting: Neurology

## 2018-04-06 VITALS — BP 144/76 | HR 78 | Temp 97.7°F | Ht 75.0 in | Wt 184.0 lb

## 2018-04-06 DIAGNOSIS — F0280 Dementia in other diseases classified elsewhere without behavioral disturbance: Secondary | ICD-10-CM

## 2018-04-06 DIAGNOSIS — G629 Polyneuropathy, unspecified: Secondary | ICD-10-CM | POA: Diagnosis not present

## 2018-04-06 DIAGNOSIS — G20 Parkinson's disease: Secondary | ICD-10-CM

## 2018-04-06 MED ORDER — CARBIDOPA-LEVODOPA 25-100 MG PO TABS: ORAL_TABLET | ORAL | 11 refills | Status: DC

## 2018-04-06 NOTE — Progress Notes (Signed)
NEUROLOGY CONSULTATION NOTE  Derek Carr MRN: [id number] DOB: Jun 07, 1931  Referring provider: Dr. [name] Primary care provider: Dr. [name]  Reason for consult:  Slowed speech, weakness, slowed movements  Dear Dr Joylene Draft:  Thank you for your kind referral of Derek Carr for consultation of the above symptoms. Although his history is well known to you, please allow me to reiterate it for the purpose of our medical record. The patient was accompanied to the clinic by his daughter Derek Carr who also provides collateral information. Records and images were personally reviewed where available.  HISTORY OF PRESENT ILLNESS: This is a pleasant 85 year old right-handed man with a history of hypertension, hyperlipidemia, prostate cancer, orthostatic hypotension, sleep apnea, TIAs, neuropathy, presenting for evaluation of the above symptoms. He has seen several neurologists in the past, records from Dr. Brett Fairy, Dr. Leonie Man, and Dr. Manuella Ghazi were reviewed. He was initially evaluated at [facility] in 2014 for gait imbalance and dizziness. He was found to have orthostatic hypotension. In 2015, he had episodes of extreme sleepiness, lethargy, limited responsiveness. His BP would be low. He had a sleep study showing central apnea. He had an MMSE of 21/30 in December 2015 and was started on Donepezil. He was seen by Dr. Manuella Ghazi in 2018 for gait and balance issues. MRI brain no acute changes. MRI L-spine showed advanced lumbar spondylosis with multilevel disc disease and facet disease. MRI C-spine showed degenerative change without canal stenosis. EMG/NCV done 08/2016 was abnormal with a generalized sensorimotor polyneuropathy, cannot rule out superimposed bialteral lower lumbosacral polyradiculopathy. Gait disturbance likely multifactorial from lumbar disc disease and neuropathy, there were no signs and symptoms of Parkinson's at that time. He was in the ER in 11/2016 for an episode of slow speech, he became  diaphoretic while eating and was slower to answer questions, leaning to the left, no clear focal weakness. The episode lasted 10 minutes. Exam normal. He was back in the hospital on 03/01/17 for transient right-sided weakness, facial droop, dysarthria, confusion. When EMS arrived within 10 minutes, all the symptoms pretty much resolved. MRI brain no acute changes, there was mild chronic microvascular disease. CTA head showed fibrofatty plaque of the distal right cavernous segment with moderate to severe 70-80% stenosis, carotid US negative. He was evaluated by neurologist Dr. Leonie Man and continued on aspirin. EEG ordered which was normal. He had an MMSE of 18/30 in May 2019. He had another event on 09/14/17 where he started having delayed verbal responses, turned gray, and became unconscious. Vital signs stable, BS 108. He was orthostatic on standing with systolic BP in the 10X, bradycardic, and had a pacemaker implanted. He was also noted to be hypoglycemic at 57. MRI brain and EEG were unremarkable. On his visit with [facility] in 10/2017, Derek Carr reported recurrent transient episodes occurring around once a week where he would have slurred speech, weak voice, generalized muscle slowing, and worsening of gait. MMSE 17/30. His last visit at [facility] was in January 2020 where Derek Carr reported worsening visual disturbance and bilateral foot numbness up to his knees. Derek Carr continued to report recurrent transient spells and was started on Topamax 50mg  daily for empiric treatment of seizures vs complicated migraines. He denies any headaches. Repeat MRI brain 01/2018 no acute changes. She has not noticed any change in symptoms with the Topamax, she states initially the symptoms were episodic but now "become a part of him."   Derek Carr states that the one thing consistent over time has been his balance and  gait issues. She started noticing speech changes a year ago, there was a change in the quality, speed, volume, and strength of voice/speech.  This is pretty consistent, but a little worse sometimes. She states his speech is never clear, just slow and distorted, hard to make out/not distinct. He has had problems with slowed motor movements, generally with his right arm when he tries to feed himself and misses sometimes. He has had a lot of coughing with solids and liquids since 2018 and saw ENT, diagnosed with "throat weakness on the right" with swallow evaluation. He had swallowing therapy which helped, things are much better with occasional coughing on swallowing. He has an unusual amount of fatigue, he can only go 500 feet then get tired even after 3 years of PT. She states "5 years ago he could jet set." He has excessive sleeping despite adjustment in CPAP settings. He states he is just relaxing. His daughter reports he yells out in his sleep but no clear REM behavior disorder. He started having vision changes over the past year, sometimes blurry, sometimes seeing spots in his vision. Sometimes he says he can't see, then later sight is fine. No visual hallucinations. He saw his optometrist with a normal exam. His daughter notes drooping eyelids. He has had difficulty writing over the past year, she is unsure if it is due to his vision or motor skills. He continues to have numbness in his feet and cannot tell where his feet are placed. He thinks his memory is pretty good, a little problematic sometimes. He does not think as quick as he used to. He lives with his wife, Derek Carr moved in a month ago and manages his medications. Another daughter manages finances. He does not drive much, he states he drove yesterday and denies getting lost. He is independent with dressing and bathing. No side effects on Donepezil. He has 2 sisters with dementia, a cousin with Parkinson's disease. No history of significant head injuries, no alcohol use.   Laboratory Data: Lab Results  Component Value Date   TSH 1.100 01/26/2018   Lab Results  Component Value Date    VITAMINB12 1,610 (H) 01/26/2018     PAST MEDICAL HISTORY: Past Medical History:  Diagnosis Date   AAA (abdominal aortic aneurysm) ([place])    Anemia    Ankle fracture    left   Aortic regurgitation    Atherosclerosis    Complication of anesthesia    CONFUSION - Pt's family very concerned about this   DDD (degenerative disc disease)    Degenerative arthritis    Dermatitis, atopic ARMS AND LEGS   Diverticulosis    Erectile dysfunction    Frequency of urination    Glaucoma    History of asbestos exposure    History of radiation therapy 8/19-13-10/18/11   prostate, 35 GY   History of shingles 2012-- BILATERAL EYES--  NO RESIDUAL   Hyperlipidemia    Hypertension    Inguinal hernia    Lumbar scoliosis    Nocturia    OA (osteoarthritis) of hip RIGHT   Prostate cancer ([place]) 05/11/2011   bx=Adenocarcinoma,gleason3+4=7, 4+4=8,PSA=10.30volume=45.7cc   Renal cyst    bilateral   Smokers' cough ([place])    Stroke [facility])    Thyroid cyst     PAST SURGICAL HISTORY: Past Surgical History:  Procedure Laterality Date   ATTEMPTED LEFT VATS/ LEFT THORACOTOMY/ RESECTION OF THE ENORMOUS, PROBABLE BRONCHOGENIC CYST  01-04-2005  DR [place] CATHETERIZATION  02-24-2009  DR  [place]   MINOR CORONARY ARTERY IRREGULARITIES/ NORMAL LVSF/ EF 65-70%   CATARACT EXTRACTION     COLONOSCOPY W/ POLYPECTOMY     CYSTOSCOPY  11/15/2011   Procedure: CYSTOSCOPY;  Surgeon: Dutch Gray, MD;  Location: [facility];  Service: Urology;  Laterality: N/A;  no seeds seen in bladder   esophageus cyst removal  YRS AGO   LUMBAR SPINE SURGERY  1991   ORIF ANKLE FRACTURE Left 05/03/2017   ORIF ANKLE FRACTURE Left 05/03/2017   Procedure: OPEN REDUCTION INTERNAL FIXATION (ORIF) BIMALLEOLAR  ANKLE FRACTURE;  Surgeon: Wylene Simmer, MD;  Location: [place];  Service: Orthopedics;  Laterality: Left;   PACEMAKER IMPLANT N/A 09/16/2017   Procedure: PACEMAKER IMPLANT;  Surgeon:  Constance Haw, MD;  Location: [place] [facility];  Service: Cardiovascular;  Laterality: N/A;   PROSTATE BIOPSY  05/11/11   Adenocarcinoma ([facility])   RADIOACTIVE SEED IMPLANT  11/15/2011   Procedure: RADIOACTIVE SEED IMPLANT;  Surgeon: Dutch Gray, MD;  Location: [facility];  Service: Urology;  Laterality: N/A;  Total number of seeds - 52   REPAIR RIGHT INGUINAL HERNIA W/ MESH  09-10-1999   TOTAL HIP ARTHROPLASTY Right 03/20/2013   Procedure: RIGHT TOTAL HIP ARTHROPLASTY ANTERIOR APPROACH;  Surgeon: [name], MD;  Location: [facility];  Service: Orthopedics;  Laterality: Right;   TOTAL HIP REVISION Right 05/14/2013   Procedure: open reduction internal fixation REVISION RIGHT HIP ;  Surgeon: [name], MD;  Location: [facility];  Service: Orthopedics;  Laterality: Right;   UMBILICAL HERNIA REPAIR  1998   epigastric    MEDICATIONS: Current Outpatient Medications on File Prior to Visit  Medication Sig Dispense Refill   acetaminophen (TYLENOL) 500 MG tablet Take 1,000 mg by mouth 2 (two) times daily.      Ascorbic Acid (VITAMIN C) 1000 MG tablet Take 1,000 mg by mouth daily.     aspirin EC 81 MG tablet Take 81 mg by mouth daily.     Calcium Carbonate Antacid (TUMS PO) Take 1 tablet by mouth daily.     cetirizine (ZYRTEC) 10 MG tablet Take 10 mg by mouth daily.     Cholecalciferol (VITAMIN D-3) 1000 units CAPS Take 1 capsule by mouth 2 (two) times daily.      clobetasol cream (TEMOVATE) 2.20 % Apply 1 application topically daily at 12 noon.     Cyanocobalamin (VITAMIN B12 PO) Take 1 tablet by mouth daily. 5000 daily     Docosahexaenoic Acid (DHA OMEGA 3 PO) Take 1,000 mg by mouth daily.      donepezil (ARICEPT) 10 MG tablet Take 10 mg by mouth at bedtime.      Fluticasone-Umeclidin-Vilant (TRELEGY ELLIPTA) 100-62.5-25 MCG/INH AEPB Inhale 1 puff into the lungs daily. 14 each 0   folic acid (FOLVITE) 1 MG tablet Take 1 mg by mouth daily.      irbesartan (AVAPRO) 75 MG tablet Take 75 mg by mouth at bedtime. **REPLACES VALSARTAN**  11   metoprolol succinate (TOPROL XL) 25 MG 24 hr tablet Take 1 tablet (25 mg total) by mouth daily. 90 tablet 3   MYRBETRIQ 50 MG TB24 tablet      Omega 3-6-9 Fatty Acids (OMEGA DHA PO) Take 1 tablet by mouth at bedtime.      Probiotic Product (PROBIOTIC PO) Take 1 capsule by mouth daily.      topiramate (TOPAMAX) 50 MG tablet Take 0.5 tablets (25 mg total) by mouth daily. 30 tablet 3  Ubiquinol (QUNOL COQ10/UBIQUINOL/MEGA) 100 MG CAPS Take 1 tablet by mouth at bedtime.      umeclidinium-vilanterol (ANORO ELLIPTA) 62.5-25 MCG/INH AEPB Only open the device one time and then take your two separate drags to be sure you get it all 1 each 11   UNABLE TO FIND Med Name: CPAP per Dr Beacher May     valACYclovir (VALTREX) 1000 MG tablet Take 1,000 mg by mouth daily.     No current facility-administered medications on file prior to visit.     ALLERGIES: Allergies  Allergen Reactions   Oxycodone Other (See Comments)   Dilaudid [Hydromorphone Hcl] Other (See Comments)    Confusion    Methocarbamol Other (See Comments)    Drowsiness    Percodan [Oxycodone-Aspirin] Other (See Comments)    Confusion    Tramadol Other (See Comments)    Drowsiness    Vicodin [Hydrocodone-Acetaminophen] Other (See Comments)    Confusion     FAMILY HISTORY: Family History  Problem Relation Age of Onset   Hypertension Mother    Prostate cancer Brother        surgery/cured   Lung cancer Brother        new primary/same brother    SOCIAL HISTORY: Social History   Socioeconomic History   Marital status: Married    Spouse name: [name]   Number of children: 4   Years of education: Secretary/administrator   Highest education level: Not on file  Occupational History   Occupation:       Employer: [employer]    Comment: Retired  Scientist, product/process development strain: Not on Training and development officer insecurity:     Worry: Not on file    Inability: Not on Lexicographer needs:    Medical: Not on file    Non-medical: Not on file  Tobacco Use   Smoking status: Former Smoker    Packs/day: 0.50    Years: 62.00    Pack years: 31.00    Types: Cigarettes   Smokeless tobacco: Never Used   Tobacco comment: smoke one or two per day  Substance and Sexual Activity   Alcohol use: No    Alcohol/week: 1.0 standard drinks    Types: 1 Cans of beer per week   Drug use: No    Comment: quit smoking 02/2012   Sexual activity: Not on file  Lifestyle   Physical activity:    Days per week: Not on file    Minutes per session: Not on file   Stress: Not on file  Relationships   Social connections:    Talks on phone: Not on file    Gets together: Not on file    Attends religious service: Not on file    Active member of club or organization: Not on file    Attends meetings of clubs or organizations: Not on file    Relationship status: Not on file   Intimate partner violence:    Fear of current or ex partner: Not on file    Emotionally abused: Not on file    Physically abused: Not on file    Forced sexual activity: Not on file  Other Topics Concern   Not on file  Social History Narrative   Patient is Married Designer, television/film set), and lives at home with his wife 77 years   Patient has 3 daughters and 1 son.   Patient has a college education.   Patient is right-handed.   Patient drinks one cup of  coffee, 1-2 cups of tea daily and rarely drinks soda.   Retired from [employer]: Constitutional: No fevers, chills, or sweats, no generalized fatigue, change in appetite Eyes: No visual changes, double vision, eye pain Ear, nose and throat: No hearing loss, ear pain, nasal congestion, sore throat Cardiovascular: No chest pain, palpitations Respiratory:  No shortness of breath at rest or with exertion, wheezes GastrointestinaI: No nausea, vomiting, diarrhea, abdominal pain, fecal  incontinence Genitourinary:  No dysuria, urinary retention or frequency Musculoskeletal:  No neck pain, back pain Integumentary: No rash, pruritus, skin lesions Neurological: as above Psychiatric: No depression, insomnia, anxiety Endocrine: No palpitations, fatigue, diaphoresis, mood swings, change in appetite, change in weight, increased thirst Hematologic/Lymphatic:  No anemia, purpura, petechiae. Allergic/Immunologic: no itchy/runny eyes, nasal congestion, recent allergic reactions, rashes  PHYSICAL EXAM: Vitals:   04/06/18 1100  BP: (!) 144/76  Pulse: 78  Temp: 97.7 F (36.5 C)  SpO2: 98%   General: No acute distress Head:  Normocephalic/atraumatic Eyes: Fundoscopic exam shows bilateral sharp discs, no vessel changes, exudates, or hemorrhages Neck: supple, no paraspinal tenderness, full range of motion Back: No paraspinal tenderness Heart: regular rate and rhythm Lungs: Clear to auscultation bilaterally. Vascular: No carotid bruits. Skin/Extremities: No rash, no edema Neurological Exam: Mental status: alert and oriented to person, place, and time, no dysarthria or aphasia, Fund of knowledge is appropriate.  Recent and remote memory are impaired.  Attention and concentration are normal.    Able to name objects, difficulty with repetition.CDT 1/5 MMSE - Mini Mental State Exam 04/06/2018 11/24/2017 06/21/2017  Orientation to time 4 3 2   Orientation to Place 3 4 4   Registration 3 3 3   Attention/ Calculation 2 1 0  Recall 0 0 0  Language- name 2 objects 2 2 2   Language- repeat 0 0 1  Language- follow 3 step command 2 3 3   Language- read & follow direction 1 1 1   Write a sentence 1 0 1  Copy design 1 0 1  Total score 19 17 18    Cranial nerves: CN I: not tested CN II: pupils equal, round and reactive to light, visual fields intact, fundi unremarkable. CN III, IV, VI:  full range of motion, no nystagmus, no ptosis CN V: facial sensation intact CN VII: upper and lower face  symmetric CN VIII: hearing intact to finger rub CN IX, X: gag intact, uvula midline CN XI: sternocleidomastoid and trapezius muscles intact CN XII: tongue midline Bulk & Tone: bulk normal, he has minimal cogwheeling on the right, no fasciculations. Motor: 5/5 throughout with no pronator drift. Sensation: intact to light touch, cold, pin on both UE, decreased pin, cold, vibration to ankles bilaterally. No extinction to double simultaneous stimulation.  Romberg test positive Deep Tendon Reflexes: +2 throughout, no ankle clonus Plantar responses: downgoing bilaterally Cerebellar: no incoordination on finger to nose, heel to shin. No dysdiadochokinesia Gait: some truncal ataxia on standing, slow and cautious, no shuffling gait noted, unable to tandem walk Tremor: there was a brief mild right hand resting tremor seen initially during the visit, no postural or endpoint tremor He is bradykinetic with decreased finger and foot taps, negative Pull test  IMPRESSION: This is a pleasant 85 year old right-handed man with a history of hypertension, hyperlipidemia, prostate cancer, orthostatic hypotension, sleep apnea, TIAs, neuropathy, presenting for evaluation of slowed speech and movements, weakness. On extensive review of prior records, concern at this time is for Dementia with Lewy Bodies. He has  had several unremarkable MRI brain and EEG studies. Symptoms started with gait dysfunction, which can be one of the earliest symptoms. He does have neuropathy as well, and gait issues are likely multifactorial. Constellation of other symptoms of bradykinesia, fluctuating cognition, hypersomnia, and recently mild right hand tremor are seen in this disorder. We discussed starting Sinemet and assess if there is improvement in bradykinesia. Side effects discussed, start 1/2 tab TID for a week, then increase to 1 tab TID. We may uptitrate as tolerated. I discussed with his daughter that this may not necessarily help with  gait disorder that is multifactorial. She wonders about other rare causes such as heavy metals, etc, we discussed repeating EMG/NCV of both lower extremities, and if there is evidence of unusual findings that raise this concern, then we would do bloodwork. Continue PT and use of walker at all times. Continue Donepezil. Continue 24/7 supervision. He was advised to stop driving. Follow-up in 3 months, they know to call for any changes.   Thank you for allowing me to participate in the care of this patient. Please do not hesitate to call for any questions or concerns.   Ellouise Newer, M.D.  CC: Dr. Joylene Draft

## 2018-04-06 NOTE — Patient Instructions (Signed)
1. Schedule EMG/NCV of both lower extremities 2. Start Sinemet 25/100mg : Take 1/2 tablet three times a day with meals for a week, then increase to 1 tablet three times a day with meals 3. Follow-up in 3 months, call for any changes

## 2018-04-07 ENCOUNTER — Encounter: Payer: Self-pay | Admitting: Physical Therapy

## 2018-04-07 ENCOUNTER — Ambulatory Visit: Payer: Medicare Other | Admitting: Physical Therapy

## 2018-04-07 ENCOUNTER — Other Ambulatory Visit: Payer: Self-pay | Admitting: Neurology

## 2018-04-07 DIAGNOSIS — R262 Difficulty in walking, not elsewhere classified: Secondary | ICD-10-CM | POA: Diagnosis not present

## 2018-04-07 DIAGNOSIS — R2689 Other abnormalities of gait and mobility: Secondary | ICD-10-CM

## 2018-04-07 DIAGNOSIS — R42 Dizziness and giddiness: Secondary | ICD-10-CM | POA: Diagnosis not present

## 2018-04-07 DIAGNOSIS — M6281 Muscle weakness (generalized): Secondary | ICD-10-CM | POA: Diagnosis not present

## 2018-04-07 DIAGNOSIS — M79605 Pain in left leg: Secondary | ICD-10-CM | POA: Diagnosis not present

## 2018-04-07 DIAGNOSIS — R2681 Unsteadiness on feet: Secondary | ICD-10-CM

## 2018-04-07 DIAGNOSIS — M25672 Stiffness of left ankle, not elsewhere classified: Secondary | ICD-10-CM | POA: Diagnosis not present

## 2018-04-07 DIAGNOSIS — R278 Other lack of coordination: Secondary | ICD-10-CM | POA: Diagnosis not present

## 2018-04-07 NOTE — Therapy (Signed)
Derek [address] [place], [place], [address] Phone: [phone]   Fax:  [phone]  Physical Therapy Treatment  Patient Details  Name: Derek [place] MRN: [id number] Date of Birth: [birth year] Referring Provider (PT): Derek [place], [place]   Encounter Date: 04/07/2018  PT End of Session - 04/07/18 1323    Visit Number  18    Number of Visits  51    Date for PT Re-Evaluation  04/24/18    Authorization Type  [employer] & Generic commercial 2nd    Authorization Time Period  Progress Note due visit 52    PT Start Time  1319    PT Stop Time  1400    PT Time Calculation (min)  41 min    Equipment Utilized During Treatment  Gait belt    Activity Tolerance  Patient limited by fatigue    Behavior During Therapy  WFL for tasks assessed/performed       Past Medical History:  Diagnosis Date  . AAA (abdominal aortic aneurysm) ([place])   . Anemia   . Ankle fracture    left  . Aortic regurgitation   . Atherosclerosis   . Complication of anesthesia    CONFUSION - Pt's family very concerned about this  . DDD (degenerative disc disease)   . Degenerative arthritis   . Dermatitis, atopic ARMS AND LEGS  . Diverticulosis   . Erectile dysfunction   . Frequency of urination   . Glaucoma   . History of asbestos exposure   . History of radiation therapy 8/19-13-10/18/11   prostate, 12 GY  . History of shingles 2012-- BILATERAL EYES--  NO RESIDUAL  . Hyperlipidemia   . Hypertension   . Inguinal hernia   . Lumbar scoliosis   . Nocturia   . OA (osteoarthritis) of hip RIGHT  . Prostate cancer ([place]) 05/11/2011   bx=Adenocarcinoma,gleason3+4=7, 4+4=8,PSA=10.30volume=45.7cc  . Renal cyst    bilateral  . Smokers' cough ([place])   . Stroke ([place])   . Thyroid cyst     Past Surgical History:  Procedure Laterality Date  . ATTEMPTED LEFT VATS/ LEFT THORACOTOMY/ RESECTION OF THE ENORMOUS, PROBABLE BRONCHOGENIC CYST  01-04-2005  DR  Derek [place]  . CARDIAC CATHETERIZATION  02-24-2009  DR Derek [place]   MINOR CORONARY ARTERY IRREGULARITIES/ NORMAL LVSF/ EF 65-70%  . CATARACT EXTRACTION    . COLONOSCOPY W/ POLYPECTOMY    . CYSTOSCOPY  11/15/2011   Procedure: CYSTOSCOPY;  Surgeon: Derek Gray, MD;  [facility];  Service: Urology;  Laterality: N/A;  no seeds seen in bladder  . esophageus cyst removal  YRS AGO  . [place]  . ORIF ANKLE FRACTURE Left 05/03/2017  . ORIF ANKLE FRACTURE Left 05/03/2017   Procedure: OPEN REDUCTION INTERNAL FIXATION (ORIF) BIMALLEOLAR  ANKLE FRACTURE;  Surgeon: Derek Simmer, MD;  Location: [facility];  Service: Orthopedics;  Laterality: Left;  . PACEMAKER IMPLANT N/A 09/16/2017   Procedure: PACEMAKER IMPLANT;  Surgeon: Derek Haw, MD;  Location: [facility];  Service: Cardiovascular;  Laterality: N/A;  . PROSTATE BIOPSY  05/11/11   Adenocarcinoma ([facility])  . RADIOACTIVE SEED IMPLANT  11/15/2011   Procedure: RADIOACTIVE SEED IMPLANT;  Surgeon: Derek Gray, MD;  Location: [facility];  Service: Urology;  Laterality: N/A;  Total number of seeds - 52  . REPAIR RIGHT INGUINAL HERNIA W/ MESH  09-10-1999  . TOTAL HIP ARTHROPLASTY Right 03/20/2013   Procedure: RIGHT TOTAL  HIP ARTHROPLASTY ANTERIOR APPROACH;  Surgeon: [name], MD;  Location: [facility];  Service: Orthopedics;  Laterality: Right;  . TOTAL HIP REVISION Right 05/14/2013   Procedure: open reduction internal fixation REVISION RIGHT HIP ;  Surgeon: [name], MD;  Location: [facility];  Service: Orthopedics;  Laterality: Right;  . UMBILICAL HERNIA REPAIR  1998   epigastric    There were no vitals filed for this visit.  Subjective Assessment - 04/07/18 1322    Subjective  Saw neurologist yesterday. Starting on Senimet today to help with Parkinson like symptoms. Also going to have NCS on LE's (has not been scheduled as yet).     Patient is accompained by:  Family member    Pertinent  History  left ankle fracture, TIAs, DDD, Lumbar scoliosis, Glaucoma, cataract surgery, HTN, right THR 03/20/13 with revision 05/14/13, right femur fracture, dementia/Alzheimers    Limitations  Standing;Walking;House hold activities    Patient Stated Goals  Patient wants to improve his balance & walking    Currently in Pain?  No/denies    Pain Score  0-No pain           [facility] Adult PT Treatment/Exercise - 04/07/18 1325      Transfers   Transfers  Sit to Stand;Stand to Sit    Sit to Stand  5: Supervision;With upper extremity assist;From bed;From chair/3-in-1    Sit to Stand Details  Verbal cues for sequencing;Verbal cues for technique    Stand to Sit  5: Supervision;With upper extremity assist;To bed;To chair/3-in-1    Stand to Sit Details (indicate cue type and reason)  Verbal cues for precautions/safety      High Level Balance   High Level Balance Comments  unsupported standing: feet side by side, then modified tandem with each foot fwd for zoom ball for 2-3 minutes each. min gaurd to min assist for balance.      Self-Care   Self-Care  Other Self-Care Comments    Other Self-Care Comments   pt's daughter present and with questions on parameters pt should use on equipment at [facility]. Provided education that it would depend on the type of seated bike he is using, they all vary some. Educated that she should go on how [place] says it feels, is it too hard, too easy or okay. Also to monitor his responcse to exercises- breathing hard or able to converse (should be able to converse with mild shortness of breath at most), HR response. Advised she should have a worker there show them how to use the equipment prior to using it. She verbalized understanding, will likely need reinforcement of this.                       Exercises   Other Exercises   seated on green air disc: wtih 2# weight dowel rod- UE raises x 20 reps, then UE trunk rotation left<>right x10 reps with cues to maintain upright  posture; with green band- rows x 10 reps with 5 sec holds, cues to maintain upright posture; zoom ball:  for 3-4 minutes with cues on technique and posture       Lumbar Exercises: Aerobic   Other Aerobic Exercise  Scifit level 3.0 with UE/LEs for 54mminutes with goal >/= 40 for strengthening and activity tolerance           Balance Exercises - 04/07/18 1351      Balance Exercises: Standing   SLS with Vectors  Solid surface;Limitations      Balance Exercises: Standing   SLS with Vectors Limitations  3 foam bubbles on floor in semi circle pattern: toe taps to each one in a circle for 3 reps each leg, seated rest break, then 3 more laps each leg. min assist with cues on stance position, weight shifting and to keep tall posture.           PT Short Term Goals - 03/28/18 1226      PT SHORT TERM GOAL #1   Title  Pt will be independent with updated HEP exercises (All STGs Target Date 03/24/2018)    Baseline  03/28/2018 Patient partially understanding but needs further instruction    Status  Partially Met      PT SHORT TERM GOAL #2   Title  Patient and daughter verbalize understanding of community based fitness options including aquatic options.     Baseline  Part of this goal is deferred due to pt and daughter declining aquatic therapy with pt reporting water is too cold for him - 03-21-18    Status  Deferred   On-going with aquatic HEP deferred due to pt's request     PT SHORT TERM GOAL #3   Title  Timed Up & Go with cane <15sec with supervision.     Baseline  16.87 secs with SBQC with CGA - 03-21-18    Status  Not Met      PT SHORT TERM GOAL #4   Title  Berg Balance >/= 42/56    Baseline  32/56 on 03/24/18    Status  Not Met      PT SHORT TERM GOAL #5   Title  Patient ambulates 100' around furniture carrying cup with cane with supervision.     Baseline  Pt unable to safely ambulate with [facility] with supervision due to increased fall risk due to balance deficits;  performance  fluctuates - is inconsistent - 03-21-18    Status  Not Met        PT Long Term Goals - 03/28/18 1228      PT LONG TERM GOAL #1   Title  Pt & family will verbalize & demonstrate independence with ongoing HEP program and fitness plan including returning to aquatic class at [facility] (All LTGs Target Date 04/21/2018)    Time  3    Period  Months    Status  Revised    Target Date  04/21/18      PT LONG TERM GOAL #2   Title  Patient ambulates with supervision 500' outdoors including grass, pavement, curbs, ramps with LRAD to improve community ambulatory skills    Time  3    Period  Months    Status  Revised    Target Date  04/21/18      PT LONG TERM GOAL #3   Title  Berg Balance >/= 45/56 to indicate lower fall risk.     Time  3    Period  Months    Status  On-going    Target Date  04/21/18      PT LONG TERM GOAL #4   Title  Timed Up-Go time with cane <13.5 seconds to indicate lower fall risk.     Time  3    Period  Months    Status  On-going    Target Date  04/21/18      PT LONG TERM GOAL #5   Title  Patient ambulates household activities including  negotiating furniture, scanning & carrying light items with cane modified independent.     Time  3    Period  Months    Status  New    Target Date  04/21/18            Plan - 04/07/18 1324    Clinical Impression Statement  Today's skilled session continued to address overall strengthening, postural strengthening and standing balance without UE support. No issues reported by pt in session today. Pt's daughter present and has no questions on updates to his [facility]TAG[employer] program.     Rehab Potential  Good    Clinical Impairments Affecting Rehab Potential  progressive decline over 2 years, good attitude and family support with 4 children in area.     PT Frequency  2x / week    PT Duration  12 weeks    PT Treatment/Interventions  Gait training;Functional mobility training;Neuromuscular re-education;Balance training;Therapeutic  exercise;Therapeutic activities;Patient/family education;ADLs/Self Care Home Management;Stair training;Orthotic Fit/Training;Manual techniques;Vestibular;Canalith Repostioning;Aquatic Therapy    PT Next Visit Plan  continue toward LTGs, balance training,     PT Home Exercise Plan  [place] and Agree with Plan of Care  Patient       Patient will benefit from skilled therapeutic intervention in order to improve the following deficits and impairments:  Abnormal gait, Decreased activity tolerance, Decreased balance, Decreased endurance, Decreased knowledge of use of DME, Decreased mobility, Decreased range of motion, Decreased safety awareness, Decreased strength, Impaired flexibility, Postural dysfunction, Dizziness  Visit Diagnosis: Other abnormalities of gait and mobility  Unsteadiness on feet  Muscle weakness (generalized)     Problem List Patient Active Problem List   Diagnosis Date Noted  . Bradycardia 09/16/2017  . CKD (chronic kidney disease), stage III ([place]) 09/15/2017  . Near syncope 09/14/2017  . Post-operative pain   . Dementia without behavioral disturbance ([place])   . TIA (transient ischemic attack)   . Reactive hypertension   . Bimalleolar fracture of left ankle 05/03/2017  . Axonal neuropathy 03/21/2017  . Right sided weakness 03/01/2017  . Cigarette smoker 06/16/2016  . Dyspnea on exertion 06/15/2016  . Alzheimer disease ([place]) 01/07/2014  . CSA (central sleep apnea) 10/05/2013  . Syncope 06/26/2013  . COPD GOLD II 06/25/2013  . BPH (benign prostatic hyperplasia) 05/18/2013  . Glaucoma 05/18/2013  . S/P right TH revision 05/14/2013  . Constipation 03/26/2013  . Osteoporosis 03/26/2013  . S/P right THA, AA 03/20/2013  . Hyperlipidemia   . Erectile dysfunction   . History of asbestos exposure   . Nocturia   . History of shingles   . Dermatitis, atopic   . OA (osteoarthritis) of hip   . Arthritis   . Frequency of urination   . Lumbar scoliosis    . Malignant neoplasm of prostate ([place]) 06/07/2011  . 85 year old gentleman with stage T3 adenocarcinoma prostate with Gleason score 4+4 and PSA of 10.3 05/11/2011    Willow Ora, PTA, [facility] [address], [place], [place] [address] [phone] 04/07/18, 5:44 PM   Name: Derek [place] MRN: [id number] Date of Birth: 02-Aug-1931

## 2018-04-09 DIAGNOSIS — S82842A Displaced bimalleolar fracture of left lower leg, initial encounter for closed fracture: Secondary | ICD-10-CM | POA: Diagnosis not present

## 2018-04-09 DIAGNOSIS — R531 Weakness: Secondary | ICD-10-CM | POA: Diagnosis not present

## 2018-04-09 DIAGNOSIS — G8918 Other acute postprocedural pain: Secondary | ICD-10-CM | POA: Diagnosis not present

## 2018-04-09 DIAGNOSIS — R351 Nocturia: Secondary | ICD-10-CM | POA: Diagnosis not present

## 2018-04-10 NOTE — Progress Notes (Signed)
Remote pacemaker transmission.   

## 2018-04-11 ENCOUNTER — Encounter: Payer: Self-pay | Admitting: Physical Therapy

## 2018-04-11 ENCOUNTER — Encounter: Payer: Self-pay | Admitting: Neurology

## 2018-04-11 ENCOUNTER — Ambulatory Visit: Payer: Medicare Other | Admitting: Physical Therapy

## 2018-04-11 DIAGNOSIS — M25672 Stiffness of left ankle, not elsewhere classified: Secondary | ICD-10-CM | POA: Diagnosis not present

## 2018-04-11 DIAGNOSIS — M79605 Pain in left leg: Secondary | ICD-10-CM

## 2018-04-11 DIAGNOSIS — M6281 Muscle weakness (generalized): Secondary | ICD-10-CM | POA: Diagnosis not present

## 2018-04-11 DIAGNOSIS — R278 Other lack of coordination: Secondary | ICD-10-CM | POA: Diagnosis not present

## 2018-04-11 DIAGNOSIS — R262 Difficulty in walking, not elsewhere classified: Secondary | ICD-10-CM | POA: Diagnosis not present

## 2018-04-11 DIAGNOSIS — R42 Dizziness and giddiness: Secondary | ICD-10-CM

## 2018-04-11 DIAGNOSIS — R2689 Other abnormalities of gait and mobility: Secondary | ICD-10-CM

## 2018-04-11 DIAGNOSIS — R2681 Unsteadiness on feet: Secondary | ICD-10-CM | POA: Diagnosis not present

## 2018-04-11 NOTE — Therapy (Signed)
[facility] [address] [place], [place], [address] Phone: [phone]   Fax:  [phone]  Physical Therapy Treatment  Patient Details  Name: Derek Carr MRN: [id number] Date of Birth: [birth year] Referring Provider (PT): Mechele Claude, [place]   Encounter Date: 04/11/2018  PT End of Session - 04/11/18 1435    Visit Number  54    Number of Visits  49    Date for PT Re-Evaluation  04/24/18    Authorization Type  [employer] & Generic commercial 2nd    Authorization Time Period  Progress Note due visit 48    PT Start Time  1319    PT Stop Time  1400    PT Time Calculation (min)  41 min    Equipment Utilized During Treatment  Gait belt    Activity Tolerance  Patient limited by fatigue    Behavior During Therapy  WFL for tasks assessed/performed       Past Medical History:  Diagnosis Date  . AAA (abdominal aortic aneurysm) ([place])   . Anemia   . Ankle fracture    left  . Aortic regurgitation   . Atherosclerosis   . Complication of anesthesia    CONFUSION - Pt's family very concerned about this  . DDD (degenerative disc disease)   . Degenerative arthritis   . Dermatitis, atopic ARMS AND LEGS  . Diverticulosis   . Erectile dysfunction   . Frequency of urination   . Glaucoma   . History of asbestos exposure   . History of radiation therapy 8/19-13-10/18/11   prostate, 20 GY  . History of shingles 2012-- BILATERAL EYES--  NO RESIDUAL  . Hyperlipidemia   . Hypertension   . Inguinal hernia   . Lumbar scoliosis   . Nocturia   . OA (osteoarthritis) of hip RIGHT  . Prostate cancer ([place]) 05/11/2011   bx=Adenocarcinoma,gleason3+4=7, 4+4=8,PSA=10.30volume=45.7cc  . Renal cyst    bilateral  . Smokers' cough ([place])   . Stroke ([place])   . Thyroid cyst     Past Surgical History:  Procedure Laterality Date  . ATTEMPTED LEFT VATS/ LEFT THORACOTOMY/ RESECTION OF THE ENORMOUS, PROBABLE BRONCHOGENIC CYST  01-04-2005  DR  Arlyce Dice  . CARDIAC CATHETERIZATION  02-24-2009  DR NASHER   MINOR CORONARY ARTERY IRREGULARITIES/ NORMAL LVSF/ EF 65-70%  . CATARACT EXTRACTION    . COLONOSCOPY W/ POLYPECTOMY    . CYSTOSCOPY  11/15/2011   Procedure: CYSTOSCOPY;  Surgeon: Dutch Gray, MD;  Location: [facility];  Service: Urology;  Laterality: N/A;  no seeds seen in bladder  . esophageus cyst removal  YRS AGO  . [place]  . ORIF ANKLE FRACTURE Left 05/03/2017  . ORIF ANKLE FRACTURE Left 05/03/2017   Procedure: OPEN REDUCTION INTERNAL FIXATION (ORIF) BIMALLEOLAR  ANKLE FRACTURE;  Surgeon: Wylene Simmer, MD;  Location: [facility];  Service: Orthopedics;  Laterality: Left;  . PACEMAKER IMPLANT N/A 09/16/2017   Procedure: PACEMAKER IMPLANT;  Surgeon: Constance Haw, MD;  Location: [facility];  Service: Cardiovascular;  Laterality: N/A;  . PROSTATE BIOPSY  05/11/11   Adenocarcinoma ([facility])  . RADIOACTIVE SEED IMPLANT  11/15/2011   Procedure: RADIOACTIVE SEED IMPLANT;  Surgeon: Dutch Gray, MD;  Location: [facility];  Service: Urology;  Laterality: N/A;  Total number of seeds - 52  . REPAIR RIGHT INGUINAL HERNIA W/ MESH  09-10-1999  . TOTAL HIP ARTHROPLASTY Right 03/20/2013   Procedure: RIGHT TOTAL  HIP ARTHROPLASTY ANTERIOR APPROACH;  Surgeon: [name], MD;  Location: [facility];  Service: Orthopedics;  Laterality: Right;  . TOTAL HIP REVISION Right 05/14/2013   Procedure: open reduction internal fixation REVISION RIGHT HIP ;  Surgeon: [name], MD;  Location: [facility];  Service: Orthopedics;  Laterality: Right;  . UMBILICAL HERNIA REPAIR  1998   epigastric    There were no vitals filed for this visit.  Subjective Assessment - 04/11/18 1321    Subjective  Reports feeling good since beginning [place]. He reports daily compliance with [place] with no problems. Denies falls and pain.     Patient is accompained by:  Family member    Pertinent History  left ankle fracture,  TIAs, DDD, Lumbar scoliosis, Glaucoma, cataract surgery, HTN, right THR 03/20/13 with revision 05/14/13, right femur fracture, dementia/Alzheimers    Limitations  Standing;Walking;House hold activities    Patient Stated Goals  Patient wants to improve his balance & walking    Currently in Pain?  No/denies                       [facility] Adult PT Treatment/Exercise - 04/11/18 1402      Transfers   Transfers  Sit to Stand;Stand to Sit    Sit to Stand  5: Supervision;Without upper extremity assist;Other (comment)   from 24" stool   Sit to Stand Details  Verbal cues for technique    Stand to Sit  5: Supervision;Without upper extremity assist;Other (comment)   to 24" stool   Stand to Sit Details (indicate cue type and reason)  Verbal cues for technique    Stand to Sit Details  verbal cues for hand placement and eccentric control      Balance   Balance Assessed  Yes      Dynamic Sitting Balance   Dynamic Sitting - Balance Activities  --      Dynamic Standing Balance   Dynamic Standing - Balance Activities  Compliant surfaces   1" foam   Compliant surfaces comments:  rows, punches, and curls with red t-band in  bars; supervision; verbal, visual, and tactile cues for form and posture; intermittent CGA with LOB; pt demonstrated increased postural sway      Ankle Exercises: Seated   BAPS  Level 1;Sitting;10 reps;Limitations   on 24" stool; single leg fwd/back, R/L, & circles   BAPS Limitations  decreased ROM on L with all motions          Balance Exercises - 04/11/18 1404      Balance Exercises: Standing   Standing Eyes Opened  Foam/compliant surface;Other reps (comment);Head turns;Limitations   in  bars, 1' foam, x10 vertical, x10 horizontal   Step Ups  4 inch;UE support 1;Forward   in  bars, supervision, verbal cues for sequencing/safety     Balance Exercises: Standing   Standing Eyes Opened Limitations  Pt required intermittent hand contact with  bars;  intermittent CGA for balance; verbal, tactile, & visual cues for posture        PT Education - 04/11/18 1325    Education provided  Yes    Education Details  reviewed PT POC and HEP with pt and daughter - will work on higher level balance in clinic, pt & daughter will continue to complete entire [place] program once a day, breaking up program as needed; talked with daughter about using a 24" stool for sit to stands and for seated tasks at a table to  work on LE strength and posture); advised daughter to check with gyms before continuing community fitness program    Person(s) Educated  Patient;Child(ren)   daughter, [name]   Methods  Explanation;Handout    Comprehension  Verbalized understanding       PT Short Term Goals - 03/28/18 1226      PT SHORT TERM GOAL #1   Title  Pt will be independent with updated HEP exercises (All STGs Target Date 03/24/2018)    Baseline  03/28/2018 Patient partially understanding but needs further instruction    Status  Partially Met      PT SHORT TERM GOAL #2   Title  Patient and daughter verbalize understanding of community based fitness options including aquatic options.     Baseline  Part of this goal is deferred due to pt and daughter declining aquatic therapy with pt reporting water is too cold for him - 03-21-18    Status  Deferred   On-going with aquatic HEP deferred due to pt's request     PT SHORT TERM GOAL #3   Title  Timed Up & Go with cane <15sec with supervision.     Baseline  16.87 secs with SBQC with CGA - 03-21-18    Status  Not Met      PT SHORT TERM GOAL #4   Title  Berg Balance >/= 42/56    Baseline  32/56 on 03/24/18    Status  Not Met      PT SHORT TERM GOAL #5   Title  Patient ambulates 100' around furniture carrying cup with cane with supervision.     Baseline  Pt unable to safely ambulate with [facility] with supervision due to increased fall risk due to balance deficits;  performance fluctuates - is inconsistent - 03-21-18    Status  Not  Met        PT Long Term Goals - 03/28/18 1228      PT LONG TERM GOAL #1   Title  Pt & family will verbalize & demonstrate independence with ongoing HEP program and fitness plan including returning to aquatic class at  [facility] (All LTGs Target Date 04/21/2018)    Time  3    Period  Months    Status  Revised    Target Date  04/21/18      PT LONG TERM GOAL #2   Title  Patient ambulates with supervision 500' outdoors including grass, pavement, curbs, ramps with LRAD to improve community ambulatory skills    Time  3    Period  Months    Status  Revised    Target Date  04/21/18      PT LONG TERM GOAL #3   Title  Berg Balance >/= 45/56 to indicate lower fall risk.     Time  3    Period  Months    Status  On-going    Target Date  04/21/18      PT LONG TERM GOAL #4   Title  Timed Up-Go time with cane <13.5 seconds to indicate lower fall risk.     Time  3    Period  Months    Status  On-going    Target Date  04/21/18      PT LONG TERM GOAL #5   Title  Patient ambulates household activities including negotiating furniture, scanning & carrying light items with cane modified independent.     Time  3    Period  Months  Status  New    Target Date  04/21/18            Plan - 04/11/18 1436    Clinical Impression Statement  Treatment session  focused on overall strenghtening, ankle ROM, postural strengthening and standing balance. Pt's daughter present throughout session and instructed on exercises they can incorporate at home in addition to [place] (see patient education). Pt's daughter has no questions on [place] at this time. Pt demonstrated increased postural sway and instability with high level balance activities. He will continue to benefit from PT in order to address his deficits with gait and mobility. POC reviewed and remains appropriate.     Rehab Potential  Good    Clinical Impairments Affecting Rehab Potential  progressive decline over 2 years, good attitude and family  support with 4 children in area.     PT Frequency  2x / week    PT Duration  12 weeks    PT Treatment/Interventions  Gait training;Functional mobility training;Neuromuscular re-education;Balance training;Therapeutic exercise;Therapeutic activities;Patient/family education;ADLs/Self Care Home Management;Stair training;Orthotic Fit/Training;Manual techniques;Vestibular;Canalith Repostioning;Aquatic Therapy    PT Next Visit Plan  continue toward LTGs, balance training, LE strengthening, gait training outdoors     PT [place] and Agree with Plan of Care  Patient;Family member/caregiver    Family Member Consulted  daughter, [name]       Patient will benefit from skilled therapeutic intervention in order to improve the following deficits and impairments:  Abnormal gait, Decreased activity tolerance, Decreased balance, Decreased endurance, Decreased knowledge of use of DME, Decreased mobility, Decreased range of motion, Decreased safety awareness, Decreased strength, Impaired flexibility, Postural dysfunction, Dizziness  Visit Diagnosis: Other abnormalities of gait and mobility  Unsteadiness on feet  Muscle weakness (generalized)  Other lack of coordination  Dizziness and giddiness  Difficulty in walking, not elsewhere classified  Stiffness of left ankle, not elsewhere classified  Pain in left leg     Problem List Patient Active Problem List   Diagnosis Date Noted  . Bradycardia 09/16/2017  . CKD (chronic kidney disease), stage III ([place]) 09/15/2017  . Near syncope 09/14/2017  . Post-operative pain   . Dementia without behavioral disturbance ([place])   . TIA (transient ischemic attack)   . Reactive hypertension   . Bimalleolar fracture of left ankle 05/03/2017  . Axonal neuropathy 03/21/2017  . Right sided weakness 03/01/2017  . Cigarette smoker 06/16/2016  . Dyspnea on exertion 06/15/2016  . Alzheimer disease ([place]) 01/07/2014  . CSA (central  sleep apnea) 10/05/2013  . Syncope 06/26/2013  . COPD GOLD II 06/25/2013  . BPH (benign prostatic hyperplasia) 05/18/2013  . Glaucoma 05/18/2013  . S/P right TH revision 05/14/2013  . Constipation 03/26/2013  . Osteoporosis 03/26/2013  . S/P right THA, AA 03/20/2013  . Hyperlipidemia   . Erectile dysfunction   . History of asbestos exposure   . Nocturia   . History of shingles   . Dermatitis, atopic   . OA (osteoarthritis) of hip   . Arthritis   . Frequency of urination   . Lumbar scoliosis   . Malignant neoplasm of prostate ([place]) 06/07/2011  . 85 year old gentleman with stage T3 adenocarcinoma prostate with Gleason score 4+4 and PSA of 10.3 05/11/2011   Joaquin Courts, SPT 04/11/2018, 2:51 PM  [facility] [address] [place], [place], [address] Phone: [phone]   Fax:  [phone]  Name: Derek Carr MRN: [id number] Date of Birth:  04/15/1931   

## 2018-04-12 ENCOUNTER — Encounter: Payer: Self-pay | Admitting: Physical Therapy

## 2018-04-12 ENCOUNTER — Telehealth: Payer: Self-pay | Admitting: Neurology

## 2018-04-12 ENCOUNTER — Ambulatory Visit: Payer: Medicare Other | Admitting: Physical Therapy

## 2018-04-12 DIAGNOSIS — R278 Other lack of coordination: Secondary | ICD-10-CM

## 2018-04-12 DIAGNOSIS — R262 Difficulty in walking, not elsewhere classified: Secondary | ICD-10-CM | POA: Diagnosis not present

## 2018-04-12 DIAGNOSIS — R2689 Other abnormalities of gait and mobility: Secondary | ICD-10-CM

## 2018-04-12 DIAGNOSIS — M6281 Muscle weakness (generalized): Secondary | ICD-10-CM

## 2018-04-12 DIAGNOSIS — R42 Dizziness and giddiness: Secondary | ICD-10-CM | POA: Diagnosis not present

## 2018-04-12 DIAGNOSIS — R2681 Unsteadiness on feet: Secondary | ICD-10-CM

## 2018-04-12 DIAGNOSIS — M79605 Pain in left leg: Secondary | ICD-10-CM | POA: Diagnosis not present

## 2018-04-12 DIAGNOSIS — M25672 Stiffness of left ankle, not elsewhere classified: Secondary | ICD-10-CM | POA: Diagnosis not present

## 2018-04-12 NOTE — Telephone Encounter (Signed)
Returned call to pt's daughter.  No answer.  LMOM asking for return call.

## 2018-04-12 NOTE — Telephone Encounter (Signed)
Patient daughter needs to speak to someone about the EMG and the sinemet medication

## 2018-04-12 NOTE — Therapy (Signed)
[facility] [address] [place], [place], [address] Phone: [phone]   Fax:  [phone]  Physical Therapy Treatment  Patient Details  Name: Derek Carr MRN: [id number] Date of Birth: 11/12/1931 Referring Provider (PT): Mechele Claude, [place]   Encounter Date: 04/12/2018  PT End of Session - 04/12/18 1219    Visit Number  42    Number of Visits  80    Date for PT Re-Evaluation  04/24/18    Authorization Type  [employer] & Generic commercial 2nd    Authorization Time Period  Progress Note due visit 76    PT Start Time  1100    PT Stop Time  1144    PT Time Calculation (min)  44 min    Equipment Utilized During Treatment  Gait belt    Activity Tolerance  Patient limited by fatigue    Behavior During Therapy  WFL for tasks assessed/performed       Past Medical History:  Diagnosis Date  . AAA (abdominal aortic aneurysm) ([place])   . Anemia   . Ankle fracture    left  . Aortic regurgitation   . Atherosclerosis   . Complication of anesthesia    CONFUSION - Pt's family very concerned about this  . DDD (degenerative disc disease)   . Degenerative arthritis   . Dermatitis, atopic ARMS AND LEGS  . Diverticulosis   . Erectile dysfunction   . Frequency of urination   . Glaucoma   . History of asbestos exposure   . History of radiation therapy 8/19-13-10/18/11   prostate, 22 GY  . History of shingles 2012-- BILATERAL EYES--  NO RESIDUAL  . Hyperlipidemia   . Hypertension   . Inguinal hernia   . Lumbar scoliosis   . Nocturia   . OA (osteoarthritis) of hip RIGHT  . Prostate cancer ([place]) 05/11/2011   bx=Adenocarcinoma,gleason3+4=7, 4+4=8,PSA=10.30volume=45.7cc  . Renal cyst    bilateral  . Smokers' cough ([place])   . Stroke ([place])   . Thyroid cyst     Past Surgical History:  Procedure Laterality Date  . ATTEMPTED LEFT VATS/ LEFT THORACOTOMY/ RESECTION OF THE ENORMOUS, PROBABLE BRONCHOGENIC CYST  01-04-2005  DR  Arlyce Dice  . CARDIAC CATHETERIZATION  02-24-2009  DR NASHER   MINOR CORONARY ARTERY IRREGULARITIES/ NORMAL LVSF/ EF 65-70%  . CATARACT EXTRACTION    . COLONOSCOPY W/ POLYPECTOMY    . CYSTOSCOPY  11/15/2011   Procedure: CYSTOSCOPY;  Surgeon: Dutch Gray, MD;  [facility];  Service: Urology;  Laterality: N/A;  no seeds seen in bladder  . esophageus cyst removal  YRS AGO  . [place]  . ORIF ANKLE FRACTURE Left 05/03/2017  . ORIF ANKLE FRACTURE Left 05/03/2017   Procedure: OPEN REDUCTION INTERNAL FIXATION (ORIF) BIMALLEOLAR  ANKLE FRACTURE;  Surgeon: Wylene Simmer, MD;  Location: [facility];  Service: Orthopedics;  Laterality: Left;  . PACEMAKER IMPLANT N/A 09/16/2017   Procedure: PACEMAKER IMPLANT;  Surgeon: Constance Haw, MD;  Location: [facility];  Service: Cardiovascular;  Laterality: N/A;  . PROSTATE BIOPSY  05/11/11   Adenocarcinoma ([facility])  . RADIOACTIVE SEED IMPLANT  11/15/2011   Procedure: RADIOACTIVE SEED IMPLANT;  Surgeon: Dutch Gray, MD;  Location: [facility];  Service: Urology;  Laterality: N/A;  Total number of seeds - 52  . REPAIR RIGHT INGUINAL HERNIA W/ MESH  09-10-1999  . TOTAL HIP ARTHROPLASTY Right 03/20/2013   Procedure: RIGHT TOTAL  HIP ARTHROPLASTY ANTERIOR APPROACH;  Surgeon: [name], MD;  Location: [facility];  Service: Orthopedics;  Laterality: Right;  . TOTAL HIP REVISION Right 05/14/2013   Procedure: open reduction internal fixation REVISION RIGHT HIP ;  Surgeon: [name], MD;  Location: [facility];  Service: Orthopedics;  Laterality: Right;  . UMBILICAL HERNIA REPAIR  1998   epigastric    There were no vitals filed for this visit.  Subjective Assessment - 04/12/18 1107    Subjective  No falls. No soreness.     Patient is accompained by:  Family member    Pertinent History  left ankle fracture, TIAs, DDD, Lumbar scoliosis, Glaucoma, cataract surgery, HTN, right THR 03/20/13 with revision 05/14/13,  right femur fracture, dementia/Alzheimers    Limitations  Standing;Walking;House hold activities    Patient Stated Goals  Patient wants to improve his balance & walking    Currently in Pain?  No/denies                       [facility] Adult PT Treatment/Exercise - 04/12/18 1100      Transfers   Transfers  Sit to Stand;Stand to Sit    Sit to Stand  5: Supervision;Without upper extremity assist;Other (comment);From elevated surface   from 24" stool   Sit to Stand Details  Verbal cues for technique    Stand to Sit  5: Supervision;Without upper extremity assist;Other (comment);To elevated surface   to 24" stool   Stand to Sit Details (indicate cue type and reason)  Verbal cues for technique      Ambulation/Gait   Ambulation/Gait  Yes    Ambulation/Gait Assistance  4: Min assist;4: Min guard    Ambulation/Gait Assistance Details  tactile & verbal cues on upright posture    Ambulation Distance (Feet)  50 Feet   50' X 2 with cane, enter/exit with RW   Assistive device  Straight cane;Rolling walker    Ambulation Surface  Indoor;Level      Balance   Balance Assessed  Yes      Dynamic Standing Balance   Dynamic Standing - Balance Activities  Compliant surfaces   1" foam   Compliant surfaces comments:  rows, punches, and curls with red t-band in  bars; supervision; verbal, visual, and tactile cues for form and posture; intermittent CGA with LOB; pt demonstrated increased postural sway      Knee/Hip Exercises: Machines for Strengthening   Total Gym Leg Press  75# BLEs 15 reps with verbal cues on technique      Ankle Exercises: Seated   BAPS  Level 1;Sitting;10 reps;Limitations   on 24" stool; single leg fwd/back, R/L, & circles   BAPS Limitations  decreased ROM on L with all motions          Balance Exercises - 04/12/18 1100      Balance Exercises: Standing   Standing Eyes Opened  Foam/compliant surface;Other reps (comment);Head turns;Limitations   in  bars, 1'  foam, x10 vertical, x10 horizontal   Standing Eyes Closed  Wide (BOA);Foam/compliant surface;10 secs;3 reps    Stepping Strategy  Anterior;Posterior;Lateral;Foam/compliant surface;5 reps   step off, stabilize without UE assist with tactile cues,    Rockerboard  Anterior/posterior;Lateral;Head turns;EO;5 reps;Other (comment)   wt shift/stabilization/recovery     Balance Exercises: Standing   Standing Eyes Opened Limitations  Pt required intermittent hand contact with  bars; intermittent CGA for balance; verbal, tactile, & visual cues for posture  PT Short Term Goals - 03/28/18 1226      PT SHORT TERM GOAL #1   Title  Pt will be independent with updated HEP exercises (All STGs Target Date 03/24/2018)    Baseline  03/28/2018 Patient partially understanding but needs further instruction    Status  Partially Met      PT SHORT TERM GOAL #2   Title  Patient and daughter verbalize understanding of community based fitness options including aquatic options.     Baseline  Part of this goal is deferred due to pt and daughter declining aquatic therapy with pt reporting water is too cold for him - 03-21-18    Status  Deferred   On-going with aquatic HEP deferred due to pt's request     PT SHORT TERM GOAL #3   Title  Timed Up & Go with cane <15sec with supervision.     Baseline  16.87 secs with SBQC with CGA - 03-21-18    Status  Not Met      PT SHORT TERM GOAL #4   Title  Berg Balance >/= 42/56    Baseline  32/56 on 03/24/18    Status  Not Met      PT SHORT TERM GOAL #5   Title  Patient ambulates 100' around furniture carrying cup with cane with supervision.     Baseline  Pt unable to safely ambulate with [facility] with supervision due to increased fall risk due to balance deficits;  performance fluctuates - is inconsistent - 03-21-18    Status  Not Met        PT Long Term Goals - 03/28/18 1228      PT LONG TERM GOAL #1   Title  Pt & family will verbalize & demonstrate independence  with ongoing HEP program and fitness plan including returning to aquatic class at [facility] (All LTGs Target Date 04/21/2018)    Time  3    Period  Months    Status  Revised    Target Date  04/21/18      PT LONG TERM GOAL #2   Title  Patient ambulates with supervision 500' outdoors including grass, pavement, curbs, ramps with LRAD to improve community ambulatory skills    Time  3    Period  Months    Status  Revised    Target Date  04/21/18      PT LONG TERM GOAL #3   Title  Berg Balance >/= 45/56 to indicate lower fall risk.     Time  3    Period  Months    Status  On-going    Target Date  04/21/18      PT LONG TERM GOAL #4   Title  Timed Up-Go time with cane <13.5 seconds to indicate lower fall risk.     Time  3    Period  Months    Status  On-going    Target Date  04/21/18      PT LONG TERM GOAL #5   Title  Patient ambulates household activities including negotiating furniture, scanning & carrying light items with cane modified independent.     Time  3    Period  Months    Status  New    Target Date  04/21/18            Plan - 04/12/18 1220    Clinical Impression Statement  Today's session focused on balance & improving reactions with ankle, hip & step  strategy. PT added leg press also for LE strength.     Rehab Potential  Good    Clinical Impairments Affecting Rehab Potential  progressive decline over 2 years, good attitude and family support with 4 children in area.     PT Frequency  2x / week    PT Duration  12 weeks    PT Treatment/Interventions  Gait training;Functional mobility training;Neuromuscular re-education;Balance training;Therapeutic exercise;Therapeutic activities;Patient/family education;ADLs/Self Care Home Management;Stair training;Orthotic Fit/Training;Manual techniques;Vestibular;Canalith Repostioning;Aquatic Therapy    PT Next Visit Plan  assess LTGs for discharge.     PT [place] and Agree with Plan of Care   Patient;Family member/caregiver    Family Member Consulted  daughter, [name]       Patient will benefit from skilled therapeutic intervention in order to improve the following deficits and impairments:  Abnormal gait, Decreased activity tolerance, Decreased balance, Decreased endurance, Decreased knowledge of use of DME, Decreased mobility, Decreased range of motion, Decreased safety awareness, Decreased strength, Impaired flexibility, Postural dysfunction, Dizziness  Visit Diagnosis: Other abnormalities of gait and mobility  Unsteadiness on feet  Muscle weakness (generalized)  Other lack of coordination  Stiffness of left ankle, not elsewhere classified     Problem List Patient Active Problem List   Diagnosis Date Noted  . Bradycardia 09/16/2017  . CKD (chronic kidney disease), stage III ([place]) 09/15/2017  . Near syncope 09/14/2017  . Post-operative pain   . Dementia without behavioral disturbance ([place])   . TIA (transient ischemic attack)   . Reactive hypertension   . Bimalleolar fracture of left ankle 05/03/2017  . Axonal neuropathy 03/21/2017  . Right sided weakness 03/01/2017  . Cigarette smoker 06/16/2016  . Dyspnea on exertion 06/15/2016  . Alzheimer disease ([place]) 01/07/2014  . CSA (central sleep apnea) 10/05/2013  . Syncope 06/26/2013  . COPD GOLD II 06/25/2013  . BPH (benign prostatic hyperplasia) 05/18/2013  . Glaucoma 05/18/2013  . S/P right TH revision 05/14/2013  . Constipation 03/26/2013  . Osteoporosis 03/26/2013  . S/P right THA, AA 03/20/2013  . Hyperlipidemia   . Erectile dysfunction   . History of asbestos exposure   . Nocturia   . History of shingles   . Dermatitis, atopic   . OA (osteoarthritis) of hip   . Arthritis   . Frequency of urination   . Lumbar scoliosis   . Malignant neoplasm of prostate ([place]) 06/07/2011  . 85 year old gentleman with stage T3 adenocarcinoma prostate with Gleason score 4+4 and PSA of 10.3 05/11/2011     Gesenia Bantz PT, DPT 04/12/2018, 12:22 PM  [facility] [address] [place], [place], [address] Phone: [phone]   Fax:  [phone]  Name: Derek Carr MRN: [id number] Date of Birth: August 13, 1931

## 2018-04-13 ENCOUNTER — Ambulatory Visit: Payer: Medicare Other | Admitting: Physical Therapy

## 2018-04-14 ENCOUNTER — Ambulatory Visit: Payer: Medicare HMO | Admitting: Rehabilitation

## 2018-04-14 DIAGNOSIS — H40003 Preglaucoma, unspecified, bilateral: Secondary | ICD-10-CM | POA: Diagnosis not present

## 2018-04-14 DIAGNOSIS — Z9889 Other specified postprocedural states: Secondary | ICD-10-CM | POA: Diagnosis not present

## 2018-04-14 DIAGNOSIS — H348322 Tributary (branch) retinal vein occlusion, left eye, stable: Secondary | ICD-10-CM | POA: Diagnosis not present

## 2018-04-14 DIAGNOSIS — B0052 Herpesviral keratitis: Secondary | ICD-10-CM | POA: Diagnosis not present

## 2018-04-14 DIAGNOSIS — H04123 Dry eye syndrome of bilateral lacrimal glands: Secondary | ICD-10-CM | POA: Diagnosis not present

## 2018-04-14 DIAGNOSIS — Z961 Presence of intraocular lens: Secondary | ICD-10-CM | POA: Diagnosis not present

## 2018-04-17 ENCOUNTER — Encounter: Payer: Self-pay | Admitting: Physical Therapy

## 2018-04-17 NOTE — Therapy (Signed)
[facility] [address] [place], [place], [address] Phone: [phone]   Fax:  [phone]  Patient Details  Name: Derek Carr MRN: [id number] Date of Birth: [birth year]  Encounter Date: 04/17/2018  [name] was contacted today regarding the temporary closing of [facility] due to Covid-19.  Therapist discussed:  Reassessing once center reopens. Continuing his HEP.   Patient is not interested in further information for an e-visit, virtual check in, or telehealth visit, if those services become available.    [facility] will follow up with patients when we are able to resume care.  Jamey Reas, PT, [facility] [address] [place], [place]  [address] Phone:  [phone] Fax:  [phone] Jamey Reas PT, DPT 04/17/2018, 1:01 PM  [facility] [address] [place], [place], [address] Phone: [phone]   Fax:  [phone]

## 2018-04-18 ENCOUNTER — Ambulatory Visit: Payer: Medicare Other | Admitting: Physical Therapy

## 2018-04-20 ENCOUNTER — Ambulatory Visit: Payer: Medicare Other | Admitting: Physical Therapy

## 2018-04-20 ENCOUNTER — Telehealth: Payer: Self-pay

## 2018-04-20 NOTE — Telephone Encounter (Signed)
Received VM from pt's daughter stating that she has some questions about pt's EMG as well as his sinemet medication.

## 2018-04-21 ENCOUNTER — Ambulatory Visit: Payer: Medicare HMO | Admitting: Physical Therapy

## 2018-04-21 NOTE — Telephone Encounter (Signed)
Attempted to return call to pt's daughter.   No answer.  VM Box full.  Unable to relay following message:  Unable to preform EMG's at this time due to the COVID-19 pandemic.   No need to stop sinemet for EMG testing.

## 2018-04-25 ENCOUNTER — Telehealth: Payer: Self-pay

## 2018-04-25 NOTE — Telephone Encounter (Signed)
I tried to call daughter to cancel appt due to COVID 19. Vm was full. Appt will be cancel. Pt is seeing [facility]. Janett Billow NP stated pt can see them for ongoing neurology concerns. Pt has seen Dr. Delice Lesch on 04/06/2018 for an evaluation and has appt in June 2020.Pt does not need to see new neurologist for the same issues. Pt will continue to see our office for sleep.Appt was cancel.

## 2018-04-26 ENCOUNTER — Encounter: Payer: Self-pay | Admitting: *Deleted

## 2018-04-27 ENCOUNTER — Ambulatory Visit: Payer: Medicare HMO | Admitting: Adult Health

## 2018-05-05 ENCOUNTER — Other Ambulatory Visit: Payer: Self-pay

## 2018-05-05 ENCOUNTER — Ambulatory Visit (INDEPENDENT_AMBULATORY_CARE_PROVIDER_SITE_OTHER): Payer: Medicare HMO | Admitting: *Deleted

## 2018-05-05 DIAGNOSIS — R001 Bradycardia, unspecified: Secondary | ICD-10-CM | POA: Diagnosis not present

## 2018-05-05 DIAGNOSIS — G459 Transient cerebral ischemic attack, unspecified: Secondary | ICD-10-CM

## 2018-05-06 LAB — CUP PACEART REMOTE DEVICE CHECK
Battery Remaining Longevity: 163 mo
Battery Voltage: 3.13 V
Brady Statistic AP VP Percent: 0.03 %
Brady Statistic AP VS Percent: 70.21 %
Brady Statistic AS VP Percent: 0.02 %
Brady Statistic AS VS Percent: 29.75 %
Brady Statistic RA Percent Paced: 70.01 %
Brady Statistic RV Percent Paced: 0.05 %
Date Time Interrogation Session: 20200410011552
Implantable Lead Implant Date: 20190823
Implantable Lead Implant Date: 20190823
Implantable Lead Location: 753859
Implantable Lead Location: 753860
Implantable Lead Model: 5076
Implantable Lead Model: 5076
Implantable Pulse Generator Implant Date: 20190823
Lead Channel Impedance Value: 342 Ohm
Lead Channel Impedance Value: 342 Ohm
Lead Channel Impedance Value: 418 Ohm
Lead Channel Impedance Value: 456 Ohm
Lead Channel Pacing Threshold Amplitude: 0.625 V
Lead Channel Pacing Threshold Amplitude: 0.875 V
Lead Channel Pacing Threshold Pulse Width: 0.4 ms
Lead Channel Pacing Threshold Pulse Width: 0.4 ms
Lead Channel Sensing Intrinsic Amplitude: 10.125 mV
Lead Channel Sensing Intrinsic Amplitude: 10.125 mV
Lead Channel Sensing Intrinsic Amplitude: 2.625 mV
Lead Channel Sensing Intrinsic Amplitude: 2.625 mV
Lead Channel Setting Pacing Amplitude: 1.5 V
Lead Channel Setting Pacing Amplitude: 2.5 V
Lead Channel Setting Pacing Pulse Width: 0.4 ms
Lead Channel Setting Sensing Sensitivity: 1.2 mV

## 2018-05-09 ENCOUNTER — Encounter: Payer: Medicare HMO | Admitting: Neurology

## 2018-05-10 DIAGNOSIS — G8918 Other acute postprocedural pain: Secondary | ICD-10-CM | POA: Diagnosis not present

## 2018-05-10 DIAGNOSIS — S82842A Displaced bimalleolar fracture of left lower leg, initial encounter for closed fracture: Secondary | ICD-10-CM | POA: Diagnosis not present

## 2018-05-10 DIAGNOSIS — R531 Weakness: Secondary | ICD-10-CM | POA: Diagnosis not present

## 2018-05-10 DIAGNOSIS — R351 Nocturia: Secondary | ICD-10-CM | POA: Diagnosis not present

## 2018-05-12 NOTE — Progress Notes (Signed)
Remote pacemaker transmission.   

## 2018-05-19 ENCOUNTER — Telehealth: Payer: Self-pay

## 2018-05-19 ENCOUNTER — Telehealth: Payer: Self-pay | Admitting: Neurology

## 2018-05-19 NOTE — Telephone Encounter (Signed)
Spoke to pt daughter she was asking about when do we think we will restart EMG/NCV testing? She would like to know if he can have lab work done now or does he need to still wait for the EMG to be done? Also can pt Sinemet be increased?

## 2018-05-19 NOTE — Telephone Encounter (Signed)
[name] called with some questions she has regarding Derek Carr's medication, next check up and upcoming procedure. Please Call. Thanks

## 2018-05-22 ENCOUNTER — Telehealth: Payer: Self-pay

## 2018-05-22 ENCOUNTER — Encounter: Payer: Self-pay | Admitting: Neurology

## 2018-05-22 NOTE — Telephone Encounter (Signed)
Spoke with [name] Pt daughter she is ok with waiting for blood work after the EMG. She did ask about pt Sinemet if it was any room for increasing it? Pt daughter informed that I would ask and call her back when after I talked to the DR, pt verbalized understanding,

## 2018-05-22 NOTE — Telephone Encounter (Signed)
-----   Message from Cameron Sprang, MD sent at 05/22/2018  2:57 PM EDT ----- Regarding: dtr questions Pls let her know that we have not received a go signal from [facility] yet, but we are hoping to start doing studies in May. I think it would be best to do the EMG first and proceed with bloodwork after. Thanks

## 2018-05-23 MED ORDER — CARBIDOPA-LEVODOPA 25-100 MG PO TABS: ORAL_TABLET | ORAL | 1 refills | Status: DC

## 2018-05-23 NOTE — Telephone Encounter (Signed)
If no side effects, we can increase Sinemet 25/100mg  tabs to 1.5 tablets three times a day with meals. Pls let daughter know and send in Rx, thanks

## 2018-05-23 NOTE — Telephone Encounter (Signed)
Pt daughter called and informed of medication change, sinemet increased to 1.5 tablets three  Times a day, new script sent into pharmacy

## 2018-05-23 NOTE — Addendum Note (Signed)
Addended by: Jake Seats on: 05/23/2018 10:54 AM   Modules accepted: Orders

## 2018-05-30 ENCOUNTER — Telehealth: Payer: Self-pay | Admitting: Neurology

## 2018-05-30 NOTE — Telephone Encounter (Signed)
Due to current COVID 19 pandemic, our office is severely reducing in office visits until further notice, in order to minimize the risk to our patients and healthcare providers.   [name] (pt daughter) returned call this afternoon and stated patient will do a telephone visit. She states that a virtual visit would not be the best option. She understands that she will receive a call from RN, front office staff, and doctor. I advised her and patient to be prepared and expecting calls between 10:30 and 11:30 AM. She verbalized understanding.  Pt understands that although there may be some limitations with this type of visit, we will take all precautions to reduce any security or privacy concerns.  Pt understands that this will be treated like an in office visit and we will file with pt's insurance, and there may be a patient responsible charge related to this service.

## 2018-05-30 NOTE — Telephone Encounter (Signed)
error 

## 2018-05-30 NOTE — Telephone Encounter (Signed)
I have attempted to call the number for the daughter we have on file and still gives same error message. Also attempted to call the home phone and it was busy.

## 2018-05-30 NOTE — Telephone Encounter (Signed)
Called the daughter [name] and the number on file states it has been changed or disconnected or no longer in service. I called the pt wife and she answered the home number. She asked me to call and talk with daughter and gave the same number that was reported as disconnected. Advised her that the patient had apt scheduled for tomorrow that we either needed to push it out or convert to VV.  The pt wife asked this be discussed with daughter and I have asked her to contact daughter and have her call me

## 2018-05-31 ENCOUNTER — Encounter: Payer: Self-pay | Admitting: Neurology

## 2018-05-31 ENCOUNTER — Ambulatory Visit (INDEPENDENT_AMBULATORY_CARE_PROVIDER_SITE_OTHER): Payer: Medicare HMO | Admitting: Neurology

## 2018-05-31 ENCOUNTER — Telehealth: Payer: Self-pay | Admitting: Neurology

## 2018-05-31 ENCOUNTER — Other Ambulatory Visit: Payer: Self-pay

## 2018-05-31 DIAGNOSIS — C61 Malignant neoplasm of prostate: Secondary | ICD-10-CM

## 2018-05-31 DIAGNOSIS — G4731 Primary central sleep apnea: Secondary | ICD-10-CM | POA: Diagnosis not present

## 2018-05-31 DIAGNOSIS — N183 Chronic kidney disease, stage 3 unspecified: Secondary | ICD-10-CM

## 2018-05-31 DIAGNOSIS — F0390 Unspecified dementia without behavioral disturbance: Secondary | ICD-10-CM | POA: Diagnosis not present

## 2018-05-31 DIAGNOSIS — G214 Vascular parkinsonism: Secondary | ICD-10-CM

## 2018-05-31 NOTE — Telephone Encounter (Signed)
We had an almost 50 minute conversation today and that question was not asked ?.   Yes, neuropathic pain can benefit from gabapentin, which also leaves patients more sleepy and drowsy., If there is no significant pain, don't start gabapentin.

## 2018-05-31 NOTE — Telephone Encounter (Signed)
Pt daughter has called asking for recommendations for pt's Neuropathy.  Daughter wants to know if Dr Brett Fairy thinks pt would benefit from a trial of Gabapentin, please call.

## 2018-05-31 NOTE — Telephone Encounter (Signed)
Called the patient to review their chart and made sure that everything was up to date. The patient and daughter will be by the home phone number to discuss the patient CPAP use. Reminded the patient once more that this is treated as a Office visit and the patient must be prepared for the visit and ready at the time of their appointment preferably in a well lit area where they have good connection for the visit. Pt verbalized understanding.

## 2018-05-31 NOTE — Progress Notes (Signed)
SLEEP MEDICINE CLINIC   Provider:  [name], M.D.    Primary Care Physician:  [name], MD   Referring Provider: Antony Contras, MD and Venancio Poisson, NP    Virtual Visit via Telephone Note  I connected with Derek Carr on 05/31/18 at 11:00 AM EDT by telephone and verified that I am speaking with the correct person using two identifiers.  Location: Patient: at home- daughter is the main conversation partner.   Provider: Farrel Conners   I discussed the limitations, risks, security and privacy concerns of performing an evaluation and management service by telephone and the availability of in person appointments. I also discussed with the patient that there may be a patient responsible charge related to this service. The patient expressed understanding and agreed to proceed.   History of Present Illness: Derek Carr is a stroke patient of Dr. Clydene Fake and was referred to the [facility] sleep [facility] by Dr. Leonie Man. He tested positive for Central- Complex Sleep Apnea.  It took two attended sleep studies to treat him adequately. He uses autotitration CPAP and this controlls his AHI. Currently at 10-15 cm water with 1 cm EPR and AHI of 3.1/h.  He has likely vascular parkinsonism/ dementia , on Aricept. Pacemaker patient.    Observations/Objective: Reviewed CPAP compliance.   Daughter reports ongoing REM activity. Has been unable to get someone to place his CPAP on him on weekends and Wednesdays. Reviewed EEG report.     [name], MD   HPI:  09-29-2017, RV . Derek Carr is a 85 y.o. male, seen here with his daughter ,after repeated TIA -strokes and syncopes.  He also was feeling no benefit from BiPAP use -he  had been using ASV since 2014. Patient has in the meantime seen Dr. Lovena Le for a loop recorder, and ended up instead with a pacemaker - He had a syncope from bradycardia.  *The patient had actually used a ASV, after he had been diagnosed with complex and mostly central  sleep apnea in August 2015, CPAP at low pressures cost central apneas to arise with an AHI of 27.7 and supine sleep there was an accentuation of 34.3, after he failed CPAP and BiPAP ASV was initiated at the time he used a pressure support minimum of 4 maximum of 10 cmH2O and end expiratory pressure of 5 cmH2O.  The AHI reached 0.0 and the patient actually could finally sleep the sleep efficiency rose to 57% from previously only 20% he also had no longer oxygen desaturation he had no PLM's, but he remained in what I suspected was atrial fibrillation with a variable R to R interval.  [name] now returned first for a home sleep study just to screen if apnea was still present and to work severity.  A watch [name] was used the study was performed on 24 August 2017 after referral by Foye Clock, it revealed complex and dominantly central sleep apnea without hypoxemia.  There was tachycardia and bradycardia noted, the non-REM AHI was higher than the REM sleep AHI indicating the central nature of apnea.  In supine sleep AHI was 53.4 on his left side AHI was 4.2/h.  The patient had endorsed the Epworth score at 20 points FS is at 47 points, BMI was 22.7.  He was asked to return for a titration. The titration to continuous positive airway pressure took place on 23 September 2017 the patient started 55 cm water pressure and was advanced to 13 sleep latency was only 15  minutes and sleep efficiency was 90.7% his central sleep apnea responded well to CPAP at 13 cmH2O was eliminated also snoring and reduced all arousals.  He did not have hypoxemia and he did have a paced EKG rhythm.  He was fitted to a Risk manager and Paykel FFM model Simplus in medium size .   I believe that his pacemaker corrected the diastolic heart failure and improved response to CPAP.    07-07-2017.  Derek Carr is a 85 y.o. male , seen here as in a referral from [place] ,  After repeated TIA -STROKE team patient , last seen by Dory Peru, NP .  Patient does have a diagnosis of sleep apnea but is having increasingly trouble using his CPAP mask.  The daughter noticed excessive daytime sleepiness and he can fall asleep even in the middle of conversations. Recommended Rv with me for CPAP follow up . The patient denies any significant weight loss, muscle fasciculations, muscle wasting or weakness. He is currently on dual antiplatelet therapy aspirin and Plavix daughter get well without bruising or bleeding. The patient was unable to use his CPAP for the last year, according to his daughter.  He has a very high fall risk, he urinates frequently at night and he has to rise from the bed to relieve himself.  She felt that the CPAP was an additional endurance something he struggled with at night to take off and put back on. During the office follow-up with Janett Billow from 21 Jun 2017 she did an extensive medical records review the stroke service admissions before.  She also stated that in her previous appointment MD had recommended to start Aricept -but given his dizziness and tendency to have bradycardia Aricept was not started and should not be started.   Memory and concentration have been relatively stable but there is evidence of vascular dementia, he continues to take his aspirin without bleeding or bruising he does not have gum bleeding.  No nosebleeds.  EEG was normal and negative for seizure activity.  PT OT continues to come to the house for the first month, after he suffered a fracture to his left ankle.  He is currently using a boot.  There have been no worsening stroke symptoms or TIA symptoms since his last hospitalization in February.      03-21-2017 : Dr Leonie Man, primary neurologist  HPI: Derek Carr is a 85 year old African-American gentleman who is accompanied today by his daughter. History is obtained from them as well as review of electronic medical records. I have personally reviewed imaging films.Derek Carr is an 85 y.o.  male with hypertension and hyperlipidemia.    Per patient approximately 7:00 in the morning on 03/01/2017 patient went to get a right cataract surgery.  He did not stop his aspirin or Plavix for this per patient.  He then took a nap at approximately 1300 hrs. patient awoke at 6 PM and daughter called EMSfor right-sided weakness, facial droop, dysarthria.  Daughter states that he was confused but patient states that he was having a difficult time understanding him which would be more of a receptive aphasia.    By the time EMS arrived (approximately 10 minutes ) all these symptoms had resolved the exception of the right upper extremity weakness--which also quickly resolved.  Patient is noted to have a TIA back in December 2018 (as noted below ) and was placed on Plavix along with his aspirin by his primary care physician.  Patient was not seen  in the hospital at that time.Per daughter patient has had one further event prior to this.  At that time they noticed that he had a left facial droop along with leaning to the left.  He did not have any dysarthria at that time. The symptoms lasted very quickly for approximately 10 minutes.   Sleep habits are as follows: The patient's bedtime is around 8. - 8.30 PM and he is asleep promptly, but soon wakes for 4-7 bathroom breaks- very frequently.  Uses a urinal but needs to stand up, when urinating in a seated position ' flow is slow" . Some urge  incontinence. He denies dreaming frequently , rarely night mares, but he yells out in his sleep , kicks , he is not waking up and has The patient sleeps in a regular bed. Not adjustable, uses 1 pillow. Sleeps supine since he needed to adjust due to FFM, now still supine without CPAP use.  He rises at 8.30 AM, and averages 8 hours of sleep with 12 hours in bed , and by 10 or 11 he can easily take another nap.   Social history: daughter is main caretaker.   Review of Systems: Out of a complete 14 system review, the patient  complains of only the following symptoms, and all other reviewed systems are negative. Derek Carr endorsed 20 points out of 24 possible points in the Epworth Sleepiness Scale before the new PSG and he is now on CPAP , and  endorsed the new Epworth at 13/ 24 points. benefitted from CPAP.   and the fatigue severity scale  39 from 47 points,  he does not appear to be depressed he is jovial and outgoing, his geriatric depression score was endorsed at 4 out of 15 points which is not significant.  REM BD    Social History   Socioeconomic History   Marital status: Married    Spouse name: [name]   Number of children: 4   Years of education: College   Highest education level: Not on file  Occupational History   Occupation:       Employer: [employer]: Retired  Scientist, product/process development strain: Not on Training and development officer insecurity:    Worry: Not on file    Inability: Not on Lexicographer needs:    Medical: Not on file    Non-medical: Not on file  Tobacco Use   Smoking status: Former Smoker    Packs/day: 0.50    Years: 62.00    Pack years: 31.00    Types: Cigarettes   Smokeless tobacco: Never Used   Tobacco comment: smoke one or two per day  Substance and Sexual Activity   Alcohol use: No    Alcohol/week: 1.0 standard drinks    Types: 1 Cans of beer per week   Drug use: No    Comment: quit smoking 02/2012   Sexual activity: Not on file  Lifestyle   Physical activity:    Days per week: Not on file    Minutes per session: Not on file   Stress: Not on file  Relationships   Social connections:    Talks on phone: Not on file    Gets together: Not on file    Attends religious service: Not on file    Active member of club or organization: Not on file    Attends meetings of clubs or organizations: Not on file    Relationship status: Not  on file   Intimate partner violence:    Fear of current or ex partner: Not on file    Emotionally  abused: Not on file    Physically abused: Not on file    Forced sexual activity: Not on file  Other Topics Concern   Not on file  Social History Narrative   Patient is Married Designer, television/film set), and lives at home with his wife 89 years   Patient has 3 daughters and 1 son.   Patient has a college education.   Patient is right-handed.   Patient drinks one cup of coffee, 1-2 cups of tea daily and rarely drinks soda.   Retired from [employer] History  Problem Relation Age of Onset   Hypertension Mother    Prostate cancer Brother        surgery/cured   Lung cancer Brother        new primary/same brother    Past Medical History:  Diagnosis Date   AAA (abdominal aortic aneurysm) ([place])    Anemia    Ankle fracture    left   Aortic regurgitation    Atherosclerosis    Complication of anesthesia    CONFUSION - Pt's family very concerned about this   DDD (degenerative disc disease)    Degenerative arthritis    Dermatitis, atopic ARMS AND LEGS   Diverticulosis    Erectile dysfunction    Frequency of urination    Glaucoma    History of asbestos exposure    History of radiation therapy 8/19-13-10/18/11   prostate, 33 GY   History of shingles 2012-- BILATERAL EYES--  NO RESIDUAL   Hyperlipidemia    Hypertension    Inguinal hernia    Lumbar scoliosis    Nocturia    OA (osteoarthritis) of hip RIGHT   Prostate cancer ([place]) 05/11/2011   bx=Adenocarcinoma,gleason3+4=7, 4+4=8,PSA=10.30volume=45.7cc   Renal cyst    bilateral   Smokers' cough ([place])    Stroke [facility])    Thyroid cyst     Past Surgical History:  Procedure Laterality Date   ATTEMPTED LEFT VATS/ LEFT THORACOTOMY/ RESECTION OF THE ENORMOUS, PROBABLE BRONCHOGENIC CYST  01-04-2005  DR Arlyce Dice   CARDIAC CATHETERIZATION  02-24-2009  DR NASHER   MINOR CORONARY ARTERY IRREGULARITIES/ NORMAL LVSF/ EF 65-70%   CATARACT EXTRACTION     COLONOSCOPY W/ POLYPECTOMY     CYSTOSCOPY  11/15/2011    Procedure: CYSTOSCOPY;  Surgeon: Dutch Gray, MD;  Location: [facility];  Service: Urology;  Laterality: N/A;  no seeds seen in bladder   esophageus cyst removal  YRS AGO   LUMBAR SPINE SURGERY  1991   ORIF ANKLE FRACTURE Left 05/03/2017   ORIF ANKLE FRACTURE Left 05/03/2017   Procedure: OPEN REDUCTION INTERNAL FIXATION (ORIF) BIMALLEOLAR  ANKLE FRACTURE;  Surgeon: Wylene Simmer, MD;  Location: [facility];  Service: Orthopedics;  Laterality: Left;   PACEMAKER IMPLANT N/A 09/16/2017   Procedure: PACEMAKER IMPLANT;  Surgeon: Constance Haw, MD;  Location: [place] [facility];  Service: Cardiovascular;  Laterality: N/A;   PROSTATE BIOPSY  05/11/11   Adenocarcinoma ([facility])   RADIOACTIVE SEED IMPLANT  11/15/2011   Procedure: RADIOACTIVE SEED IMPLANT;  Surgeon: Dutch Gray, MD;  Location: [facility];  Service: Urology;  Laterality: N/A;  Total number of seeds - 52   REPAIR RIGHT INGUINAL HERNIA W/ MESH  09-10-1999   TOTAL HIP ARTHROPLASTY Right 03/20/2013   Procedure: RIGHT TOTAL HIP ARTHROPLASTY ANTERIOR APPROACH;  Surgeon:  [name], MD;  Location: [facility];  Service: Orthopedics;  Laterality: Right;   TOTAL HIP REVISION Right 05/14/2013   Procedure: open reduction internal fixation REVISION RIGHT HIP ;  Surgeon: [name], MD;  Location: [facility];  Service: Orthopedics;  Laterality: Right;   UMBILICAL HERNIA REPAIR  1998   epigastric    Current Outpatient Medications  Medication Sig Dispense Refill   acetaminophen (TYLENOL) 500 MG tablet Take 1,000 mg by mouth 2 (two) times daily.      Ascorbic Acid (VITAMIN C) 1000 MG tablet Take 1,000 mg by mouth daily.     aspirin EC 81 MG tablet Take 81 mg by mouth daily.     b complex vitamins tablet Take 1 tablet by mouth daily.     Calcium Carbonate Antacid (TUMS PO) Take 1 tablet by mouth daily.     carbidopa-levodopa (SINEMET IR) 25-100 MG tablet Take 1.5 tablet three times a day with meals (Patient  taking differently: Take 1 tablet by mouth 3 (three) times daily. Take 1.5 tablet three times a day with meals) 405 tablet 1   cetirizine (ZYRTEC) 10 MG tablet Take 10 mg by mouth daily.     Cholecalciferol (VITAMIN D-3) 125 MCG (5000 UT) TABS Take 1 capsule by mouth 2 (two) times daily.      clobetasol cream (TEMOVATE) 7.41 % Apply 1 application topically daily as needed.      Docosahexaenoic Acid (DHA OMEGA 3 PO) Take 1,000 mg by mouth daily.      donepezil (ARICEPT) 10 MG tablet START 1/2 TABLET AT BEDTIME FOR 4 WEEKS,THEN 1 TABLET AT NIGHT (Patient taking differently: Take 10 mg by mouth at bedtime. ) 30 tablet 3   Fluticasone-Umeclidin-Vilant (TRELEGY ELLIPTA) 100-62.5-25 MCG/INH AEPB Inhale 1 puff into the lungs daily. 14 each 0   folic acid (FOLVITE) 1 MG tablet Take 1 mg by mouth daily.     irbesartan (AVAPRO) 75 MG tablet Take 75 mg by mouth at bedtime. **REPLACES VALSARTAN**  11   metoprolol succinate (TOPROL XL) 25 MG 24 hr tablet Take 1 tablet (25 mg total) by mouth daily. (Patient not taking: Reported on 05/31/2018) 90 tablet 3   MYRBETRIQ 50 MG TB24 tablet      Probiotic Product (PROBIOTIC PO) Take 1 capsule by mouth daily.      topiramate (TOPAMAX) 50 MG tablet Take 0.5 tablets (25 mg total) by mouth daily. (Patient taking differently: Take 50 mg by mouth daily. ) 30 tablet 3   Ubiquinol (QUNOL COQ10/UBIQUINOL/MEGA) 100 MG CAPS Take 1 tablet by mouth at bedtime.      umeclidinium-vilanterol (ANORO ELLIPTA) 62.5-25 MCG/INH AEPB Only open the device one time and then take your two separate drags to be sure you get it all 1 each 11   UNABLE TO FIND Med Name: CPAP per Dr Beacher May     valACYclovir (VALTREX) 1000 MG tablet Take 1,000 mg by mouth daily.     No current facility-administered medications for this visit.     Allergies as of 05/31/2018 - Review Complete 05/31/2018  Allergen Reaction Noted   Oxycodone Other (See Comments) 06/21/2017   Dilaudid [hydromorphone  hcl] Other (See Comments) 05/07/2013   Methocarbamol Other (See Comments) 05/07/2013   Percodan [oxycodone-aspirin] Other (See Comments) 05/07/2013   Tramadol Other (See Comments) 05/07/2013   Vicodin [hydrocodone-acetaminophen] Other (See Comments) 05/07/2013    Vitals: There were no vitals taken for this visit. Last Weight:  Wt Readings from Last 1 Encounters:  04/06/18 184 lb (83.5 kg)   IRJ:JOACZ is no height or weight on file to calculate BMI.     Last Height:   Ht Readings from Last 1 Encounters:  04/06/18 6\' 3"  (1.905 m)    Physical exam from last encounter-   General: The patient is awake, alert and appears not in acute distress. The patient is well groomed. He had shingles three times, in the face, and affecting the right eye .  Head: Normocephalic, atraumatic. Neck is supple. Mallampati 3,  neck circumference: 17"  Nasal airflow patent -  Retrognathia is mild.  Cardiovascular:  Regular rate and rhythm , without  murmurs or carotid bruit, and without distended neck veins. Respiratory: Lungs are clear to auscultation. Skin:  Without evidence of edema, or rash Trunk: BMI is 22.5 .  The patient's posture is stooped , uses a walker   Neurologic exam : The patient is awake and alert, Attention span & concentration ability appears impaired . He is drowsy .  Speech is fluent,  without dysarthria, but with mild dysphonia and aphasia.  Mood and affect are appropriate.  Cranial nerves: Pupils are equal and briskly reactive to light. . Extraocular movements  in vertical and horizontal planes intact and without nystagmus. Visual fields by finger perimetry are intact. Hearing to finger rub intact.   Facial sensation intact to fine touch.  Facial motor strength is symmetric and tongue and uvula move midline. Shoulder shrug was symmetrical.   The patient's motor examination shows residual stroke symptoms, he does not have muscle wasting he does not have fasciculations, his left  foot remains in a boot, his shoulders are of equal height bilaterally he has normal head turning, both hands show equal strength muscle bulk.  He has a history of loss of sensory from mid calf level down and numbness in the feet to vibration has been present for years.    Coordination is slowed rapid alternating movements are slowed, and finger-to-nose test was not tested today but was described bilaterally accurately in May.  He is not sitting in a wheelchair but walks with an walker.  Ankle jerks were depressed according to his neuropathy, and he has 1+ reflexes.   Assessment/ Plan: This is based on reported , not observed symptoms.     1)  Complex sleep apnea confirmed by HST, followed by CPAP titration.  EDS-now resolved or at least improved - on CPAP - not longer ASV or BiPAP- has more energy. [facility] was his established DME- he would like to switch. Was using a Simplus medium mask in the lab.  He may yell out in his dreams, but not leave the bed.   2)  Nocturia now at 2-3 times , from 4-7 times. CPAP sss- not rise in central apneas.   3)  Dr. Lovena Le diagnosed the syncope following documented  bradycardia , he is now on a pacemaker, and this likely reduced he diastolic heart failure, and central sleep apnea.  4) parkinsonism - PD - evaluated with Dr. Delice Lesch.  patient has REM BD, but no visual hallucinations.  Not likely Lewy body- likely vascular dementia.  Slowed overall , with dysphonia,with dysphagia, gait imbalance reported. His  speech disturbance improved on carbi-levo dopa.  Only  sleepiness is better on CPAP. Gait has not improved.  He has had only mild Tremor when  I saw him last.  Dry eyes, doesn't blink - neuro-ophthalmologist has not seen anything but dry eyes.     Needs to continue  CPAP by DME -  if that is not possible will write for an auto-titration capable CPAP , setting of 10-15 cm water , 1 cm EPR . He has no attendant on week ends and wednesdays and he is not s using his  CPAP on these days. There is a need for a sitter service on those days, to tuck him in. Discussed with daughter " home instead".  Dementia- I want the patient to d/c Topiramate- he has memory impairment at baseline and TPM shoudl be d/c- I will inform Venancio Poisson, NP.  I would love to d/c anticholinergic medication such as Myrbetriq.   I will have him follow up with NP in 6 month.   I hope he will be able to complete an EMG and blood studies with Dr. Delice Lesch.      I discussed the assessment and treatment plan with the patient. The patient was provided an opportunity to ask questions and all were answered. The patient agreed with the plan and demonstrated an understanding of the instructions.   The patient was advised to call back or seek an in-person evaluation if the symptoms worsen or if the condition fails to improve as anticipated.  I provided  20 minutes of non-face-to-face time during this encounter.   [name], MD 08/02/9782, 78:41 AM  Certified in Neurology by ABPN Certified in Sleep Medicine by [facility] [address] [place] [address]    Dr Leonie Man, Dr Tamala Julian. Dr Joylene Draft.

## 2018-05-31 NOTE — Patient Instructions (Signed)
I d/c Topiramate,  Patient has not taken myrbetriq and has still nocturia.    Parkinson Disease Parkinson disease is a long-term (chronic) condition. It gets worse over time (is progressive). Parkinson disease limits your ability:  To control how your body moves.  To move your body normally. This condition affects each person differently. The condition can range from mild to very bad. This condition tends to progress slowly over many years. Follow these instructions at home:  Take over-the-counter and prescription medicines only as told by your doctor.  Put grab bars and railings in your home. These help to prevent falls.  Follow instructions from your doctor about what you can or cannot eat or drink.  Go back to your normal activities as told by your doctor. Ask your doctor what activities are safe for you.  Exercise as told by your doctor or physical therapist.  Keep all follow-up visits as told by your doctor. This is important. These include any visits with a speech or occupational therapist.  Think about joining a support group for people who have Parkinson disease. Contact a doctor if:  Medicines do not help your symptoms.  You feel off-balance.  You fall at home.  You need more help at home.  You have: ? Trouble swallowing. ? A very hard time pooping (constipation). ? A lot of side effects from your medicines.  You see or hear things that are not real (hallucinate).  You feel: ? Confused. ? Anxious. ? Sad (depressed). Get help right away if:  You were hurt in a fall.  You cannot swallow without choking.  You have chest pain.  You have trouble breathing.  You do not feel safe at home. This information is not intended to replace advice given to you by your health care provider. Make sure you discuss any questions you have with your health care provider. Document Released: 04/05/2011 Document Revised: 06/19/2015 Document Reviewed: 11/01/2014 Elsevier  Interactive Patient Education  2019 [employer].

## 2018-05-31 NOTE — Addendum Note (Signed)
Addended by: Darleen Crocker on: 05/31/2018 10:45 AM   Modules accepted: Orders

## 2018-06-01 ENCOUNTER — Encounter: Payer: Self-pay | Admitting: Neurology

## 2018-06-01 NOTE — Telephone Encounter (Signed)
Called the patient's daughter back on the patient's home line. There was no answer. I will send a message on mychart and hope they get it. There was no answering machine for me to leave a message.  If daughter calls back please advise of Dohmeier's message below

## 2018-06-07 NOTE — Telephone Encounter (Signed)
Called the patient and spoke with daughter and got the pt scheduled for 6 mth follow up with Janett Billow Nov 10,2020 at 12:45pm. Pt daughter was also given the previous message in regards to her question about the gabapentin. She has asked if Dr Brett Fairy knows of a natural remedy or something that could be offered to the patient that doesn't have any confusion or sedating SE. Advised that she only discussed the gabapentin not being a good alternative and that I would have to ask her since she didn't mention anything previously. I will inform the daughter after discussing with Dr Brett Fairy if she has anything to offer. Pt's daughter verbalized understanding.

## 2018-06-07 NOTE — Telephone Encounter (Signed)
Pts daughter is calling in wanting to know if there is anything Herbal or Topical he can use ? She stated anything topical

## 2018-06-08 MED ORDER — LIDOCAINE-PRILOCAINE 2.5-2.5 % EX CREA: 1.0000 "application " | TOPICAL_CREAM | CUTANEOUS | 1 refills | Status: DC | PRN

## 2018-06-08 NOTE — Telephone Encounter (Signed)
He can use EMLA cream, an amalgamated form of local anesthetics.   Lidocaine cream can help, too.  EMLA has been very expensive as it is often custom made. I can try to see if [employer] covers any.  CD

## 2018-06-09 ENCOUNTER — Telehealth: Payer: Self-pay

## 2018-06-09 ENCOUNTER — Telehealth: Payer: Self-pay | Admitting: Neurology

## 2018-06-09 NOTE — Telephone Encounter (Signed)
Calling in about having problems with hypostatic hypertension. More frequent. And wanting to know if it is related to the carbidopa levodopa. Please call her back at [phone]. Thanks!

## 2018-06-09 NOTE — Telephone Encounter (Signed)
If she has not noticed any improvement with mobility, can try holding the Sinemet and see how his BP would be. Thanks

## 2018-06-09 NOTE — Telephone Encounter (Signed)
If she noticed an increase in the hypotension since increasing the dose, it may be related. Did she think that it helped with his mobility? Try going back down to 1 tab TID and see if better. Thanks

## 2018-06-09 NOTE — Telephone Encounter (Signed)
Pt daughter [name] called back and was informed of Dr.Aquino's recommendation, [name] stated that she has noticed some improvement with his mobility so she will keep a close check on his BP and keep him on the 1 Tablet TID for right now , she will keep [place] posted

## 2018-06-09 NOTE — Telephone Encounter (Signed)
Daughter would like to speak with Douglass Rivers

## 2018-06-09 NOTE — Telephone Encounter (Signed)
Pt daughter wanted to know if the pt had an episode of some sort on 4/29 and 06/08/2018. I asked her to send a manual transmission to be reviewed. I told her when we receive the transmission the nurse will review it and give her a call back. She states she will call me back as soon as she do the transmission on the pt.

## 2018-06-09 NOTE — Telephone Encounter (Signed)
LMOVM for the pt daughter to call me on my direct office number. The pt transmission received and ready for the nurse to review it. 06/09/2018

## 2018-06-09 NOTE — Telephone Encounter (Signed)
Normal device function, no episodes, lead trends stable.   Will route to Sherri/Dr [facility] as episodes were not device related  Chanetta Marshall, NP 06/09/2018 2:52 PM

## 2018-06-09 NOTE — Telephone Encounter (Signed)
Spoke with [name] she said that he only took 2 doses of the 1.5 tab and they seen his BP drop they got scared and have only been doing the 1 tab TID his last BP drop was earlier this week. [name] also wanted to let you know that she has been in contact with his cardiologist about his pacemaker to see if anything has been going on with his heart they are pulling his readings and she is waiting to hear from them as well

## 2018-06-09 NOTE — Telephone Encounter (Signed)
Pt called no answer voice mail left for return call

## 2018-06-13 ENCOUNTER — Telehealth: Payer: Self-pay | Admitting: Internal Medicine

## 2018-06-13 NOTE — Telephone Encounter (Signed)
Episodes of orthostatic hypotension in April and May. Pt felt faint and sweat / hot and flush today but no drops in BP. Instructed pt daughter to send a manual transmission. Attempted to help her send x2 w/ no success. Pt daughter is going to call tech support for further help trouble shooting the monitor.

## 2018-06-13 NOTE — Telephone Encounter (Signed)
New message:   Patient daughter calling concering her fathers device Please call her concerning any events that has happended with his devices.

## 2018-06-13 NOTE — Telephone Encounter (Signed)
She wants to know if any episodes happened on 05/24/2018 or 06/08/2018. Last transmission received 06/09/2018.

## 2018-06-13 NOTE — Telephone Encounter (Signed)
LMTCB for Derek Carr [phone] re: Dr. [name] recommendation.

## 2018-06-13 NOTE — Telephone Encounter (Signed)
I spoke to patient's daughter [name]) and informed her of Amber and Dr [employer] recommendation after reviewing transmission.  She was grateful for the call and verbalized understanding and will f/u with PCP.

## 2018-06-13 NOTE — Telephone Encounter (Signed)
Called the patient's daughter and informed her that Dr Dohmeier sent in the EMLA cream. I advised the patient's daughter it was already called into the pharmacy. They have picked it up and it was covered. Advised to use this on the patient as needed to see if this will help with the neuropathy. Pt's daughter was appreciative for the call.

## 2018-06-13 NOTE — Telephone Encounter (Signed)
Pt daughter would like a call back from the nurse.

## 2018-06-13 NOTE — Telephone Encounter (Signed)
Spoke with [name] to advise of no episodes on 05/24/18 and 06/08/18. Needs address by Legrand Como, RN.

## 2018-06-13 NOTE — Telephone Encounter (Signed)
No obvious device related cause for episodes. Would discuss with PCP.

## 2018-06-13 NOTE — Telephone Encounter (Signed)
Pt daughter called back and stated that she talked to [employer] and stated that they told her that the patient monitor can not send transmission right now due to technical issues and it'll take 2 days to resolve these issues. I informed her that I would forward message to RN. Pt daughter verbalized understanding.

## 2018-06-14 ENCOUNTER — Encounter: Payer: Self-pay | Admitting: Neurology

## 2018-06-16 ENCOUNTER — Ambulatory Visit: Payer: Medicare HMO | Admitting: Internal Medicine

## 2018-06-21 ENCOUNTER — Other Ambulatory Visit: Payer: Self-pay

## 2018-06-21 ENCOUNTER — Telehealth: Payer: Self-pay | Admitting: Neurology

## 2018-06-21 MED ORDER — GABAPENTIN 100 MG PO CAPS: 100.0000 mg | ORAL_CAPSULE | Freq: Every day | ORAL | 1 refills | Status: DC

## 2018-06-21 NOTE — Telephone Encounter (Signed)
Daughter called wanting to talk about how the physical assessment was going to work with the virtual visit. She wanted to hear it from a nurse. Please call her back at [phone] or [phone]. Thanks!

## 2018-06-21 NOTE — Telephone Encounter (Signed)
Spoke with Derek Carr pt daughter, pt had a fall yesterday 1st one in 2 years, she stated it was not motor related it was due to his neuropathy, she is asking about Gabapentin for the neuropathy, she stated they know the side affects it could make his sleepy and if it does they will stop it but they want to try something, pt was on magnesium 400mg  BID they have stopped that after stopping that the drop in BP has stopped, they are thinking he was getting to much Mag. Pt is taken Sinemet BID at this time not TID and his BP is remaining stable, pt daughter asking how will you do your assessment during e-visit also wondering if in person visit my be possible? They were also hopping that the EMG could be done before his June 10 appointment but  Looks like Dr Posey Pronto has a busy schedule so they are ok with the 6/25 appointment they have.

## 2018-06-21 NOTE — Telephone Encounter (Signed)
Pt daughter called she was on the phone she will call back

## 2018-06-21 NOTE — Telephone Encounter (Signed)
error 

## 2018-06-21 NOTE — Telephone Encounter (Signed)
Spoke with pt daughter [name] to let know that I spoke to Dr. Posey Pronto about EMG and we plan to open up procedures to high risk patients end of June, that is why he is scheduled at that time. I do think it is better if I see him in-person, but we have not opened up in-person visits to high risk patients as well. Is she comfortable with doing an in-person visit with me the beginning of July, after his EMG? If so, pls cancel the June 10 appt and ask front staff to schedule in July. If she would like to do e-visit anyway, ok to keep. The gabapentin can help with the tingling/burning from neuropathy, but it would not help with the numbness or balance issues. If he is having a lot of tingling/burning, we can start low dose gabapentin 100mg  qhs. her know. Pt daughter wanted to start the gabapentin script sent to [facility], Robin transferred up from to schedule in person July appointment. Going to keep the June e-visit, will keep [place] posted on how pt is doing

## 2018-06-21 NOTE — Telephone Encounter (Signed)
Pls let daughter know that I spoke to Dr. Posey Pronto about EMG and we plan to open up procedures to high risk patients end of June, that is why he is scheduled at that time. I do think it is better if I see him in-person, but we have not opened up in-person visits to high risk patients as well. Is she comfortable with doing an in-person visit with me the beginning of July, after his EMG? If so, pls cancel the June 10 appt and ask front staff to schedule in July. If she would like to do e-visit anyway, ok to keep. The gabapentin can help with the tingling/burning from neuropathy, but it would not help with the numbness or balance issues. If he is having a lot of tingling/burning, we can start low dose gabapentin 100mg  qhs. Thanks

## 2018-06-28 ENCOUNTER — Telehealth: Payer: Self-pay

## 2018-06-28 NOTE — Telephone Encounter (Signed)
Spoke with pt daughter they just started the medication 06/27/2018 because of daughters work schedule she wanted to be with him when he 1st started taken it.  I will call back next week to follow up to see how he is doing on it,

## 2018-06-29 DIAGNOSIS — G4733 Obstructive sleep apnea (adult) (pediatric): Secondary | ICD-10-CM | POA: Diagnosis not present

## 2018-07-03 ENCOUNTER — Ambulatory Visit: Payer: Medicare HMO | Admitting: Podiatry

## 2018-07-03 ENCOUNTER — Encounter: Payer: Self-pay | Admitting: Podiatry

## 2018-07-03 ENCOUNTER — Other Ambulatory Visit: Payer: Self-pay

## 2018-07-03 DIAGNOSIS — G609 Hereditary and idiopathic neuropathy, unspecified: Secondary | ICD-10-CM

## 2018-07-03 NOTE — Progress Notes (Signed)
He presents today with his daughter to discuss his neuropathy once again he states my feet feel completely numb to my ankles.  Objective: Vital signs are stable alert and oriented x3.  Neurology has no idea why he has neuropathy but referred him back to [place].  He has deep pain receptors but light touch receptors are nonreactive.  Assessment: Idiopathic neuropathy.  Plan: Discussed oral therapy like nerve remedy.  Explained to her that Lyrica and gabapentin would not work for him because of his complete anesthetic sensation.  Also recommended some compression hose to help with the proprioceptive sensation also discussed topical creams.  We also discussed balance braces.

## 2018-07-05 ENCOUNTER — Other Ambulatory Visit: Payer: Self-pay

## 2018-07-05 ENCOUNTER — Telehealth (INDEPENDENT_AMBULATORY_CARE_PROVIDER_SITE_OTHER): Payer: Medicare HMO | Admitting: Neurology

## 2018-07-05 DIAGNOSIS — R269 Unspecified abnormalities of gait and mobility: Secondary | ICD-10-CM

## 2018-07-05 DIAGNOSIS — G20 Parkinson's disease: Secondary | ICD-10-CM

## 2018-07-05 DIAGNOSIS — G629 Polyneuropathy, unspecified: Secondary | ICD-10-CM

## 2018-07-05 NOTE — Progress Notes (Signed)
Virtual Visit via Video Note The purpose of this virtual visit is to provide medical care while limiting exposure to the novel coronavirus.    Consent was obtained for video visit:  Yes.   Answered questions that patient had about telehealth interaction:  Yes.   I discussed the limitations, risks, security and privacy concerns of performing an evaluation and management service by telemedicine. I also discussed with the patient that there may be a patient responsible charge related to this service. The patient expressed understanding and agreed to proceed.  Pt location: Home Physician Location: office Name of referring provider:  [name], MD I connected with Derek Carr at patients initiation/request on 07/05/2018 at 11:00 AM EDT by video enabled telemedicine application and verified that I am speaking with the correct person using two identifiers. Pt MRN:  [id number] Pt DOB:  February 13, 1931 Video Participants:  Derek Carr;  [name] (daughter)   History of Present Illness:  The patient was last seen in March 2020 for evaluation of slowed speech, weakness, slowed movements. His daughter Derek Carr is present during this e-visit to provide additional information. On his initial visit, there were several issues, his daughter was reporting recurrent transient episodes where he would have slurred speech with weak voice, generalized slowing, and worsening gait. He was started on Topamax on his visit with [facility] for presumed seizures versus complicated migraines, however there was no improvement in spells. He was noted to have gait dysfunction, bradykinesia, mild right hand tremor, we had discussed that the episodes may be episodes of fluctuating cognition which can be seen with Dementia with Lewy Bodies, but can also occur with other neurodegenerative conditions such as Parkinson's disease dementia and Alzheimer's disease. We agreed to do a trial of Sinemet 25/100mg  TID. Initially his daughter  felt that he was having more tremors and requested to increase Sinemet dose to 1.5 tabs TID, but after he had near syncopal episodes due to orthostatic hypotension, she reduced dose to 1 tab BID. He still had one more episode of near syncope mid-March, she stopped magnesium, he has not had any further near syncope. She reports that she is not sure if it is in her mind, but the low dose Sinemet 25/100mg  BID seems to be working. His speech is much better and the tremors in his right hand/arm are very rare now. He has not had any of the transient spells where he would have slurred speech with weak voice, generalized slowing, and worsening gait. Topamax was discontinued by Dr. Brett Fairy.  Derek Carr had called about gabapentin for neuropathy, he reports numbness and feeling like the bottom of his feet are walking on hard plaster. He had a lot of itching on low dose gabapentin 100mg  qhs so she stopped it. He states he is feeling good, he denies any neck/back pain, he denies any numbness/tingling. He feels his balance is off a little bit. He had a fall 2-3 weeks ago using his walker, he was walking fast and hit the door and fell. No injuries. He uses his CPAP and is having a good day today, his daughter notes he is more attentive when he uses his CPAP correctly.    History on Initial Assessment 04/06/2018: This is a pleasant 85 year old right-handed man with a history of hypertension, hyperlipidemia, prostate cancer, orthostatic hypotension, sleep apnea, TIAs, neuropathy, presenting for evaluation of the above symptoms. He has seen several neurologists in the past, records from Dr. Brett Fairy, Dr. Leonie Man, and Dr. Manuella Ghazi were  reviewed. He was initially evaluated at [facility] in 2014 for gait imbalance and dizziness. He was found to have orthostatic hypotension. In 2015, he had episodes of extreme sleepiness, lethargy, limited responsiveness. His BP would be low. He had a sleep study showing central apnea. He had an MMSE of 21/30 in  December 2015 and was started on Donepezil. He was seen by Dr. Manuella Ghazi in 2018 for gait and balance issues. MRI brain no acute changes. MRI L-spine showed advanced lumbar spondylosis with multilevel disc disease and facet disease. MRI C-spine showed degenerative change without canal stenosis. EMG/NCV done 08/2016 was abnormal with a generalized sensorimotor polyneuropathy, cannot rule out superimposed bialteral lower lumbosacral polyradiculopathy. Gait disturbance likely multifactorial from lumbar disc disease and neuropathy, there were no signs and symptoms of Parkinson's at that time. He was in the ER in 11/2016 for an episode of slow speech, he became diaphoretic while eating and was slower to answer questions, leaning to the left, no clear focal weakness. The episode lasted 10 minutes. Exam normal. He was back in the hospital on 03/01/17 for transient right-sided weakness, facial droop, dysarthria, confusion. When EMS arrived within 10 minutes, all the symptoms pretty much resolved. MRI brain no acute changes, there was mild chronic microvascular disease. CTA head showed fibrofatty plaque of the distal right cavernous segment with moderate to severe 70-80% stenosis, carotid US negative. He was evaluated by neurologist Dr. Leonie Man and continued on aspirin. EEG ordered which was normal. He had an MMSE of 18/30 in May 2019. He had another event on 09/14/17 where he started having delayed verbal responses, turned gray, and became unconscious. Vital signs stable, BS 108. He was orthostatic on standing with systolic BP in the 50K, bradycardic, and had a pacemaker implanted. He was also noted to be hypoglycemic at 57. MRI brain and EEG were unremarkable. On his visit with [facility] in 10/2017, Derek Carr reported recurrent transient episodes occurring around once a week where he would have slurred speech, weak voice, generalized muscle slowing, and worsening of gait. MMSE 17/30. His last visit at [facility] was in January 2020 where Derek Carr  reported worsening visual disturbance and bilateral foot numbness up to his knees. Derek Carr continued to report recurrent transient spells and was started on Topamax 50mg  daily for empiric treatment of seizures vs complicated migraines. He denies any headaches. Repeat MRI brain 01/2018 no acute changes. She has not noticed any change in symptoms with the Topamax, she states initially the symptoms were episodic but now "become a part of him."   Derek Carr states that the one thing consistent over time has been his balance and gait issues. She started noticing speech changes a year ago, there was a change in the quality, speed, volume, and strength of voice/speech. This is pretty consistent, but a little worse sometimes. She states his speech is never clear, just slow and distorted, hard to make out/not distinct. He has had problems with slowed motor movements, generally with his right arm when he tries to feed himself and misses sometimes. He has had a lot of coughing with solids and liquids since 2018 and saw ENT, diagnosed with "throat weakness on the right" with swallow evaluation. He had swallowing therapy which helped, things are much better with occasional coughing on swallowing. He has an unusual amount of fatigue, he can only go 500 feet then get tired even after 3 years of PT. She states "5 years ago he could jet set." He has excessive sleeping despite adjustment in CPAP settings. He  states he is just relaxing. His daughter reports he yells out in his sleep but no clear REM behavior disorder. He started having vision changes over the past year, sometimes blurry, sometimes seeing spots in his vision. Sometimes he says he can't see, then later sight is fine. No visual hallucinations. He saw his optometrist with a normal exam. His daughter notes drooping eyelids. He has had difficulty writing over the past year, she is unsure if it is due to his vision or motor skills. He continues to have numbness in his feet and  cannot tell where his feet are placed. He thinks his memory is pretty good, a little problematic sometimes. He does not think as quick as he used to. He lives with his wife, Derek Carr moved in a month ago and manages his medications. Another daughter manages finances. He does not drive much, he states he drove yesterday and denies getting lost. He is independent with dressing and bathing. No side effects on Donepezil. He has 2 sisters with dementia, a cousin with Parkinson's disease. No history of significant head injuries, no alcohol use.    Current Outpatient Medications on File Prior to Visit  Medication Sig Dispense Refill   acetaminophen (TYLENOL) 500 MG tablet Take 1,000 mg by mouth 2 (two) times daily.      Ascorbic Acid (VITAMIN C) 1000 MG tablet Take 1,000 mg by mouth daily.     aspirin EC 81 MG tablet Take 81 mg by mouth daily.     b complex vitamins tablet Take 1 tablet by mouth daily.     Calcium Carbonate Antacid (TUMS PO) Take 1 tablet by mouth daily.     carbidopa-levodopa (SINEMET IR) 25-100 MG tablet Take 1.5 tablet three times a day with meals (Patient taking differently: Take 2 tablets by mouth 3 (three) times daily. Take 1.5 tablet three times a day with meals) 405 tablet 1   cetirizine (ZYRTEC) 10 MG tablet Take 10 mg by mouth daily.     Cholecalciferol (VITAMIN D-3) 125 MCG (5000 UT) TABS Take 1 capsule by mouth 2 (two) times daily.      clobetasol cream (TEMOVATE) 4.01 % Apply 1 application topically daily as needed.      Docosahexaenoic Acid (DHA OMEGA 3 PO) Take 1,000 mg by mouth daily.      donepezil (ARICEPT) 10 MG tablet START 1/2 TABLET AT BEDTIME FOR 4 WEEKS,THEN 1 TABLET AT NIGHT (Patient taking differently: Take 10 mg by mouth at bedtime. ) 30 tablet 3   folic acid (FOLVITE) 1 MG tablet Take 1 mg by mouth daily.     gabapentin (NEURONTIN) 100 MG capsule Take 1 capsule (100 mg total) by mouth at bedtime. (Patient not taking: Reported on 07/04/2018) 90 capsule 1     irbesartan (AVAPRO) 75 MG tablet Take 75 mg by mouth at bedtime. **REPLACES VALSARTAN**  11   lidocaine-prilocaine (EMLA) cream Apply 1 application topically as needed. 30 g 1   NONFORMULARY OR COMPOUNDED ITEM [facility] Neuropathy Cream 120 grams 3 refills  Sent in 07-04-18     Probiotic Product (PROBIOTIC PO) Take 1 capsule by mouth daily.      Ubiquinol (QUNOL COQ10/UBIQUINOL/MEGA) 100 MG CAPS Take 1 tablet by mouth at bedtime.      umeclidinium-vilanterol (ANORO ELLIPTA) 62.5-25 MCG/INH AEPB Only open the device one time and then take your two separate drags to be sure you get it all 1 each 11   UNABLE TO FIND Med Name: CPAP per Dr  Dohmier     valACYclovir (VALTREX) 1000 MG tablet Take 1,000 mg by mouth daily.     No current facility-administered medications on file prior to visit.      Observations/Objective:   GEN:  The patient appears stated age and is in NAD.  Neurological examination: Patient is awake, alert, oriented x 3. No aphasia or dysarthria. Intact fluency and comprehension. Remote and recent memory impaired. Able to name and repeat. Cranial nerves: Extraocular movements intact with no nystagmus. No facial asymmetry. Motor: moves all extremities symmetrically, at least anti-gravity x 4. No incoordination on finger to nose testing. Gait: narrow-based with walker,no shuffling gait or ataxia noted. He has fair finger and foot taps today, seems less bradykinetic.    Assessment and Plan:   This is a pleasant 85 yo RH man with a history of hypertension, hyperlipidemia, prostate cancer, orthostatic hypotension, sleep apnea, TIAs, neuropathy, who presented for evaluation of slowed speech and movements, weakness. Etiology of symptoms unclear, he has a neurodegenerative disorder with fluctuating cognition, bradykinesia, tremor. We had previously discussed Dementia with Lewy Bodies. He does not have any hallucinations. He appears to have had a significant response to low dose  Sinemet, daughter has not noticed much tremor now, he has not had any of the episodes of fluctuating speech symptoms. He is off Topamax. Continue current medications. His daughter wonders about clonidine for BP and potentially neuropathy, but discussed that with his orthostatic hypotension, would have to clear this with PCP/Cardiology. Hold off on gabapentin for now. Proceed with EMG/NCV of both lower extremities for gait dysfunction, his daughter had wondered about other rare causes such as heavy metals, etc, if EMG/NCV shows abnormalities raising concern for further testing, we will proceed. Continue 24/7 supervision. Follow-up after EMG, they know to call for any changes.    Follow Up Instructions:    -I discussed the assessment and treatment plan with the patient. The patient was provided an opportunity to ask questions and all were answered. The patient agreed with the plan and demonstrated an understanding of the instructions.   The patient was advised to call back or seek an in-person evaluation if the symptoms worsen or if the condition fails to improve as anticipated.    Total Time spent in visit with the patient was:  30 minutes, of which more than 50% of the time was spent in counseling and/or coordinating care on the above.   Pt understands and agrees with the plan of care outlined.     Cameron Sprang, MD

## 2018-07-11 ENCOUNTER — Telehealth: Payer: Self-pay

## 2018-07-11 NOTE — Telephone Encounter (Signed)
Prior authorization in process

## 2018-07-11 NOTE — Telephone Encounter (Signed)
-----   Message from Simone Curia sent at 07/10/2018  4:41 PM EDT ----- Regarding: Medication auth Pt's daughter called to see if we received auth for medication- [id number]

## 2018-07-12 DIAGNOSIS — M810 Age-related osteoporosis without current pathological fracture: Secondary | ICD-10-CM | POA: Diagnosis not present

## 2018-07-12 DIAGNOSIS — G4730 Sleep apnea, unspecified: Secondary | ICD-10-CM | POA: Diagnosis not present

## 2018-07-12 DIAGNOSIS — N183 Chronic kidney disease, stage 3 (moderate): Secondary | ICD-10-CM | POA: Diagnosis not present

## 2018-07-12 DIAGNOSIS — R269 Unspecified abnormalities of gait and mobility: Secondary | ICD-10-CM | POA: Diagnosis not present

## 2018-07-12 DIAGNOSIS — R1310 Dysphagia, unspecified: Secondary | ICD-10-CM | POA: Diagnosis not present

## 2018-07-12 DIAGNOSIS — G259 Extrapyramidal and movement disorder, unspecified: Secondary | ICD-10-CM | POA: Diagnosis not present

## 2018-07-12 DIAGNOSIS — R55 Syncope and collapse: Secondary | ICD-10-CM | POA: Diagnosis not present

## 2018-07-12 DIAGNOSIS — J449 Chronic obstructive pulmonary disease, unspecified: Secondary | ICD-10-CM | POA: Diagnosis not present

## 2018-07-12 DIAGNOSIS — Z950 Presence of cardiac pacemaker: Secondary | ICD-10-CM | POA: Diagnosis not present

## 2018-07-13 ENCOUNTER — Telehealth: Payer: Self-pay

## 2018-07-13 DIAGNOSIS — G4733 Obstructive sleep apnea (adult) (pediatric): Secondary | ICD-10-CM | POA: Diagnosis not present

## 2018-07-13 NOTE — Telephone Encounter (Signed)
-----   Message from Simone Curia sent at 07/10/2018  4:41 PM EDT ----- Regarding: Medication auth Pt's daughter called to see if we received auth for medication- [id number]

## 2018-07-13 NOTE — Telephone Encounter (Signed)
I called daughter and informed her that [employer] will not cover one part of the compound medication (Bupivacaine powder).  She stated that [facility] sent her a list of compound pharmacies that may be cheaper than [facility].  She is going to call around and let me know if she finds another pharmacy to sent script to .

## 2018-07-19 ENCOUNTER — Telehealth: Payer: Self-pay | Admitting: Neurology

## 2018-07-19 NOTE — Telephone Encounter (Signed)
Daughter was calling in wanting to speak with nurse or DR about some questions she had regarding the patient. Please call her back at [phone]. Thanks!

## 2018-07-20 ENCOUNTER — Ambulatory Visit (INDEPENDENT_AMBULATORY_CARE_PROVIDER_SITE_OTHER): Payer: Medicare HMO | Admitting: Neurology

## 2018-07-20 ENCOUNTER — Other Ambulatory Visit: Payer: Self-pay

## 2018-07-20 DIAGNOSIS — G20 Parkinson's disease: Secondary | ICD-10-CM | POA: Diagnosis not present

## 2018-07-20 DIAGNOSIS — G629 Polyneuropathy, unspecified: Secondary | ICD-10-CM

## 2018-07-20 NOTE — Telephone Encounter (Signed)
Spoke with pt daughter [name] she had some question for Dr. Posey Pronto they are on the way to the office for his appointment she said she will give her questions to the front desk to give to [facility]

## 2018-07-20 NOTE — Procedures (Signed)
[facility]  [address] [place]  [place], [place] [address] Tel: [phone] Fax:  [phone] Test Date:  07/20/2018  Patient: Derek Carr DOB: 12/19/1931 Physician: Narda Amber, DO  Sex: Male Height: 6\' 3"  Ref Phys: Ellouise Newer, MD  ID#: [id number] Temp: 32.0C Technician:    Patient Complaints: This is an 85 year old man referred for evaluation of bilateral feet numbness and gait imbalance.  NCV & EMG Findings: Extensive electrodiagnostic testing of the right lower extremity and additional studies of the left shows:  1. Bilateral sural and superficial peroneal sensory responses are within normal limits. 2. Bilateral peroneal motor responses at the extensor digitorum brevis are absent, and normal at the tibialis anterior.  Bilateral tibial motor responses show reduced amplitudes. 3. Bilateral tibial H reflex studies are within normal limits. 4. Chronic motor axonal loss changes are seen affecting the muscles below the knee bilaterally, without accompanied active denervation.   Impression: The electrophysiologic findings are most consistent with a severe, chronic, and symmetric sensorimotor axonal polyneuropathy affecting the lower extremities.     ___________________________ Narda Amber, DO    Nerve Conduction Studies Anti Sensory Summary Table   Site NR Peak (ms) Norm Peak (ms) P-T Amp (V) Norm P-T Amp  Left Sup Peroneal Anti Sensory (Ant Lat Mall)  32C  12 cm NR  <4.6  >3  Right Sup Peroneal Anti Sensory (Ant Lat Mall)  32C  12 cm NR  <4.6  >3  Left Sural Anti Sensory (Lat Mall)  32C  Calf NR  <4.6  >3  Right Sural Anti Sensory (Lat Mall)  32C  Calf NR  <4.6  >3   Motor Summary Table   Site NR Onset (ms) Norm Onset (ms) O-P Amp (mV) Norm O-P Amp Site1 Site2 Delta-0 (ms) Dist (cm) Vel (m/s) Norm Vel (m/s)  Left Peroneal Motor (Ext Dig Brev)  32C  Ankle NR  <6.0  >2.5 B Fib Ankle  0.0  >40  B Fib NR     Poplt B Fib  0.0  >40  Poplt NR             Right Peroneal Motor (Ext Dig Brev)  32C  Ankle NR  <6.0  >2.5 B Fib Ankle  0.0  >40  B Fib NR     Poplt B Fib  0.0  >40  Poplt NR            Left Peroneal TA Motor (Tib Ant)  32C  Fib Head    4.4 <4.5 4.4 >3 Poplit Fib Head 1.5 10.0 67 >40  Poplit    5.9  4.4         Right Peroneal TA Motor (Tib Ant)  32C  Fib Head    3.6 <4.5 4.0 >3 Poplit Fib Head 1.6 10.0 63 >40  Poplit    5.2  3.9         Left Tibial Motor (Abd Hall Brev)  32C  Ankle    3.7 <6.0 3.3 >4 Knee Ankle 10.9 45.0 41 >40  Knee    14.6  2.6         Right Tibial Motor (Abd Hall Brev)  32C  Ankle    5.4 <6.0 3.1 >4 Knee Ankle 11.1 46.0 41 >40  Knee    16.5  1.8          H Reflex Studies   NR H-Lat (ms) Lat Norm (ms) L-R H-Lat (ms)  Left Tibial (Gastroc)  32C  33.20 <35 0.00  Right Tibial (Gastroc)  32C     33.20 <35 0.00   EMG   Side Muscle Ins Act Fibs Psw Fasc Number Recrt Dur Dur. Amp Amp. Poly Poly. Comment  Right AntTibialis Nml Nml Nml Nml 1- Mod-R Some 1+ Some 1+ Some 1+ N/A  Right Gastroc Nml Nml Nml Nml 1- Mod-R Some 1+ Some 1+ Some 1+ N/A  Right Flex Dig Long Nml Nml Nml Nml 2- Mod-R Many 1+ Many 1+ Many 1+ N/A  Right RectFemoris Nml Nml Nml Nml Nml Nml Nml Nml Nml Nml Nml Nml N/A  Right GluteusMed Nml Nml Nml Nml Nml Nml Nml Nml Nml Nml Nml Nml N/A  Right BicepsFemS Nml Nml Nml Nml Nml Nml Nml Nml Nml Nml Nml Nml N/A  Left BicepsFemS Nml Nml Nml Nml Nml Nml Nml Nml Nml Nml Nml Nml N/A  Left AntTibialis Nml Nml Nml Nml 1- Mod-R Some 1+ Some 1+ Some 1+ N/A  Left Gastroc Nml Nml Nml Nml 1- Mod-R Some 1+ Some 1+ Some 1+ N/A  Left Flex Dig Long Nml Nml Nml Nml 2- Mod-R Many 1+ Many 1+ Many 1+ N/A  Left RectFemoris Nml Nml Nml Nml Nml Nml Nml Nml Nml Nml Nml Nml N/A  Left GluteusMed Nml Nml Nml Nml Nml Nml Nml Nml Nml Nml Nml Nml N/A      Waveforms:

## 2018-07-24 DIAGNOSIS — R768 Other specified abnormal immunological findings in serum: Secondary | ICD-10-CM | POA: Diagnosis not present

## 2018-07-25 DIAGNOSIS — G603 Idiopathic progressive neuropathy: Secondary | ICD-10-CM | POA: Insufficient documentation

## 2018-07-26 DIAGNOSIS — H04123 Dry eye syndrome of bilateral lacrimal glands: Secondary | ICD-10-CM | POA: Diagnosis not present

## 2018-07-26 DIAGNOSIS — S0501XA Injury of conjunctiva and corneal abrasion without foreign body, right eye, initial encounter: Secondary | ICD-10-CM | POA: Diagnosis not present

## 2018-07-26 DIAGNOSIS — H16223 Keratoconjunctivitis sicca, not specified as Sjogren's, bilateral: Secondary | ICD-10-CM | POA: Diagnosis not present

## 2018-07-26 DIAGNOSIS — H401132 Primary open-angle glaucoma, bilateral, moderate stage: Secondary | ICD-10-CM | POA: Diagnosis not present

## 2018-08-04 ENCOUNTER — Ambulatory Visit (INDEPENDENT_AMBULATORY_CARE_PROVIDER_SITE_OTHER): Payer: Medicare HMO | Admitting: *Deleted

## 2018-08-04 DIAGNOSIS — R55 Syncope and collapse: Secondary | ICD-10-CM

## 2018-08-04 LAB — CUP PACEART REMOTE DEVICE CHECK
Battery Remaining Longevity: 159 mo
Battery Voltage: 3.08 V
Brady Statistic AP VP Percent: 0.03 %
Brady Statistic AP VS Percent: 76.88 %
Brady Statistic AS VP Percent: 0.02 %
Brady Statistic AS VS Percent: 23.08 %
Brady Statistic RA Percent Paced: 76.73 %
Brady Statistic RV Percent Paced: 0.04 %
Date Time Interrogation Session: 20200710003815
Implantable Lead Implant Date: 20190823
Implantable Lead Implant Date: 20190823
Implantable Lead Location: 753859
Implantable Lead Location: 753860
Implantable Lead Model: 5076
Implantable Lead Model: 5076
Implantable Pulse Generator Implant Date: 20190823
Lead Channel Impedance Value: 304 Ohm
Lead Channel Impedance Value: 342 Ohm
Lead Channel Impedance Value: 380 Ohm
Lead Channel Impedance Value: 456 Ohm
Lead Channel Pacing Threshold Amplitude: 0.625 V
Lead Channel Pacing Threshold Amplitude: 0.875 V
Lead Channel Pacing Threshold Pulse Width: 0.4 ms
Lead Channel Pacing Threshold Pulse Width: 0.4 ms
Lead Channel Sensing Intrinsic Amplitude: 1.75 mV
Lead Channel Sensing Intrinsic Amplitude: 1.75 mV
Lead Channel Sensing Intrinsic Amplitude: 10.875 mV
Lead Channel Sensing Intrinsic Amplitude: 10.875 mV
Lead Channel Setting Pacing Amplitude: 1.5 V
Lead Channel Setting Pacing Amplitude: 2.5 V
Lead Channel Setting Pacing Pulse Width: 0.4 ms
Lead Channel Setting Sensing Sensitivity: 1.2 mV

## 2018-08-07 ENCOUNTER — Other Ambulatory Visit: Payer: Self-pay

## 2018-08-07 ENCOUNTER — Encounter: Payer: Self-pay | Admitting: Neurology

## 2018-08-07 ENCOUNTER — Telehealth (INDEPENDENT_AMBULATORY_CARE_PROVIDER_SITE_OTHER): Payer: Medicare HMO | Admitting: Neurology

## 2018-08-07 VITALS — BP 124/68 | HR 62 | Ht 75.0 in | Wt 180.0 lb

## 2018-08-07 DIAGNOSIS — G20 Parkinson's disease: Secondary | ICD-10-CM

## 2018-08-07 DIAGNOSIS — G629 Polyneuropathy, unspecified: Secondary | ICD-10-CM

## 2018-08-07 DIAGNOSIS — F0390 Unspecified dementia without behavioral disturbance: Secondary | ICD-10-CM

## 2018-08-07 NOTE — Progress Notes (Signed)
Virtual Visit via Video Note The purpose of this virtual visit is to provide medical care while limiting exposure to the novel coronavirus.    Consent was obtained for video visit:  Yes.   Answered questions that patient had about telehealth interaction:  Yes.   I discussed the limitations, risks, security and privacy concerns of performing an evaluation and management service by telemedicine. I also discussed with the patient that there may be a patient responsible charge related to this service. The patient expressed understanding and agreed to proceed.  Pt location: Home Physician Location: office Name of referring provider:  [name], MD I connected with Derek Carr at patients initiation/request on 08/07/2018 at  3:00 PM EDT by video enabled telemedicine application and verified that I am speaking with the correct person using two identifiers. Pt MRN:  [id number] Pt DOB:  1931/05/27 Video Participants:  Derek Carr;  Derek Carr (daughter)  History of Present Illness:  The patient was seen as a virtual video visit on 08/07/2018. He was last seen a month ago for slowed speech, weakness, slowed movements. His daughter Derek Carr is present during the e-visit to provide additional information. On his initial visit, there were several issues his daughter was concerned about, she was reporting recurrent transient episodes where he would have slurred speech with weak voice, generalized slowing, and worsening gait. He was started on Topamax on his visit with Derek Carr for presumed seizures versus complicated migraines, however there was no improvement in spells. This has been discontinued. He was noted to have gait dysfunction, bradykinesia, mild right hand tremor, we had discussed that the episodes may be episodes of fluctuating cognition which can be seen with Dementia with Lewy Bodies, but can also occur with other neurodegenerative conditions such as Parkinson's disease dementia and Alzheimer's  disease. We agreed to do a trial of Sinemet 25/100mg , he has only been taking it BID and Derek Carr feels that this has helped him, his speech is much better and the tremors in his right hand/arm are very rare. He has not had the episodes of transient slurred speech, generalized slowing, and worsening gait. He had an EMG/NCV of both LE done last 6/25 which showed severe, chronic and symmetric sensorimotor axonal polyneuropathy. Findings were discussed today. They deny any falls since his last visit. He still has a different sensation in both feet, like walking on plaster. Hands are unaffected. He feels a little dizzy when standing, but not like it was. No tremors. Derek Carr reports that his speech just gets a little bit slurred, it is rare and does not last long at all. She feels they occur maybe 10% of the time, here and there. He has had one nightmare since starting Sinemet. She continues to be concerned that something is missed because last year he could walk, even jump, then suddenly he could not. He has seen Rheumatology with several labs ordered.    History on Initial Assessment 04/06/2018: This is a pleasant 85 year old right-handed Carr with a history of hypertension, hyperlipidemia, prostate cancer, orthostatic hypotension, sleep apnea, TIAs, neuropathy, presenting for evaluation of the above symptoms. He has seen several neurologists in the past, records from Dr. Brett Carr, Dr. Leonie Carr, and Dr. Manuella Carr were reviewed. He was initially evaluated at [facility] in 2014 for gait imbalance and dizziness. He was found to have orthostatic hypotension. In 2015, he had episodes of extreme sleepiness, lethargy, limited responsiveness. His BP would be low. He had a sleep study showing central apnea. He  had an MMSE of 21/30 in December 2015 and was started on Donepezil. He was seen by Dr. Manuella Carr in 2018 for gait and balance issues. MRI brain no acute changes. MRI L-spine showed advanced lumbar spondylosis with multilevel disc disease and  facet disease. MRI C-spine showed degenerative change without canal stenosis. EMG/NCV done 08/2016 was abnormal with a generalized sensorimotor polyneuropathy, cannot rule out superimposed bialteral lower lumbosacral polyradiculopathy. Gait disturbance likely multifactorial from lumbar disc disease and neuropathy, there were no signs and symptoms of Parkinson's at that time. He was in the ER in 11/2016 for an episode of slow speech, he became diaphoretic while eating and was slower to answer questions, leaning to the left, no clear focal weakness. The episode lasted 10 minutes. Exam normal. He was back in the hospital on 03/01/17 for transient right-sided weakness, facial droop, dysarthria, confusion. When EMS arrived within 10 minutes, all the symptoms pretty much resolved. MRI brain no acute changes, there was mild chronic microvascular disease. CTA head showed fibrofatty plaque of the distal right cavernous segment with moderate to severe 70-80% stenosis, carotid US negative. He was evaluated by neurologist Dr. Leonie Carr and continued on aspirin. EEG ordered which was normal. He had an MMSE of 18/30 in May 2019. He had another event on 09/14/17 where he started having delayed verbal responses, turned gray, and became unconscious. Vital signs stable, BS 108. He was orthostatic on standing with systolic BP in the 00B, bradycardic, and had a pacemaker implanted. He was also noted to be hypoglycemic at 57. MRI brain and EEG were unremarkable. On his visit with Derek Carr in 10/2017, Derek Carr reported recurrent transient episodes occurring around once a week where he would have slurred speech, weak voice, generalized muscle slowing, and worsening of gait. MMSE 17/30. His last visit at [facility] Carr was in January 2020 where Derek Carr reported worsening visual disturbance and bilateral foot numbness up to his knees. Derek Carr continued to report recurrent transient spells and was started on Topamax 50mg  daily for empiric treatment of seizures vs  complicated migraines. He denies any headaches. Repeat MRI brain 01/2018 no acute changes. She has not noticed any change in symptoms with the Topamax, she states initially the symptoms were episodic but now "become a part of him."   Derek Carr states that the one thing consistent over time has been his balance and gait issues. She started noticing speech changes a year ago, there was a change in the quality, speed, volume, and strength of voice/speech. This is pretty consistent, but a little worse sometimes. She states his speech is never clear, just slow and distorted, hard to make out/not distinct. He has had problems with slowed motor movements, generally with his right arm when he tries to feed himself and misses sometimes. He has had a lot of coughing with solids and liquids since 2018 and saw ENT, diagnosed with "throat weakness on the right" with swallow evaluation. He had swallowing therapy which helped, things are much better with occasional coughing on swallowing. He has an unusual amount of fatigue, he can only go 500 feet then get tired even after 3 years of PT. She states "5 years ago he could jet set." He has excessive sleeping despite adjustment in CPAP settings. He states he is just relaxing. His daughter reports he yells out in his sleep but no clear REM behavior disorder. He started having vision changes over the past year, sometimes blurry, sometimes seeing spots in his vision. Sometimes he says he can't see, then later sight  is fine. No visual hallucinations. He saw his optometrist with a normal exam. His daughter notes drooping eyelids. He has had difficulty writing over the past year, she is unsure if it is due to his vision or motor skills. He continues to have numbness in his feet and cannot tell where his feet are placed. He thinks his memory is pretty good, a little problematic sometimes. He does not think as quick as he used to. He lives with his wife, Derek Carr moved in a month ago and manages  his medications. Another daughter manages finances. He does not drive much, he states he drove yesterday and denies getting lost. He is independent with dressing and bathing. No side effects on Donepezil. He has 2 sisters with dementia, a cousin with Parkinson's disease. No history of significant head injuries, no alcohol use.      Current Outpatient Medications on File Prior to Visit  Medication Sig Dispense Refill   acetaminophen (TYLENOL) 500 MG tablet Take 1,000 mg by mouth 2 (two) times daily.      Ascorbic Acid (VITAMIN C) 1000 MG tablet Take 1,000 mg by mouth daily.     aspirin EC 81 MG tablet Take 81 mg by mouth daily.     b complex vitamins tablet Take 1 tablet by mouth daily.     Calcium Carbonate Antacid (TUMS PO) Take 1 tablet by mouth daily.     carbidopa-levodopa (SINEMET IR) 25-100 MG tablet Take 1.5 tablet three times a day with meals (Patient taking differently: Take 1 tablet by mouth 2 (two) times a day. ) 405 tablet 1   cetirizine (ZYRTEC) 10 MG tablet Take 10 mg by mouth daily.     Cholecalciferol (VITAMIN D-3) 125 MCG (5000 UT) TABS Take 1 capsule by mouth 2 (two) times daily.      clobetasol cream (TEMOVATE) 6.62 % Apply 1 application topically daily as needed.      Docosahexaenoic Acid (DHA OMEGA 3 PO) Take 1,000 mg by mouth daily.      donepezil (ARICEPT) 10 MG tablet START 1/2 TABLET AT BEDTIME FOR 4 WEEKS,THEN 1 TABLET AT NIGHT (Patient taking differently: Take 10 mg by mouth at bedtime. ) 30 tablet 3   folic acid (FOLVITE) 1 MG tablet Take 1 mg by mouth daily.     gabapentin (NEURONTIN) 100 MG capsule Take 1 capsule (100 mg total) by mouth at bedtime. 90 capsule 1   irbesartan (AVAPRO) 75 MG tablet Take 75 mg by mouth at bedtime. **REPLACES VALSARTAN**  11   lidocaine-prilocaine (EMLA) cream Apply 1 application topically as needed. 30 g 1   NONFORMULARY OR COMPOUNDED ITEM [facility] Neuropathy Cream 120 grams 3 refills  Sent in 07-04-18      Probiotic Product (PROBIOTIC PO) Take 1 capsule by mouth daily.      Ubiquinol (QUNOL COQ10/UBIQUINOL/MEGA) 100 MG CAPS Take 1 tablet by mouth at bedtime.      umeclidinium-vilanterol (ANORO ELLIPTA) 62.5-25 MCG/INH AEPB Only open the device one time and then take your two separate drags to be sure you get it all 1 each 11   UNABLE TO FIND Med Name: CPAP per Dr Beacher May     valACYclovir (VALTREX) 1000 MG tablet Take 1,000 mg by mouth daily.     No current facility-administered medications on file prior to visit.      Observations/Objective:   Vitals:   08/07/18 1424  BP: 124/68  Pulse: 62  Weight: 180 lb (81.6 kg)  Height: 6\' 3"  (  1.905 m)   GEN:  The patient appears stated age and is in NAD.  Neurological examination: Patient is awake, alert, oriented x 3. No aphasia or dysarthria. Intact fluency and comprehension. Remote and recent memory impaired. Able to name and repeat. Cranial nerves: Extraocular movements intact with no nystagmus. No facial asymmetry. Motor: moves all extremities symmetrically, at least anti-gravity x 4. No incoordination on finger to nose testing. Gait: no ataxia with walker, no shuffling gait noted. Fair finger taps.   Assessment and Plan:   This is a pleasant 85 yo RH Carr with a history of hypertension, hyperlipidemia, prostate cancer, orthostatic hypotension, sleep apnea, TIAs, neuropathy, who presented for evaluation of slowed speech and movements, weakness. Etiology of symptoms unclear, he has a neurodegenerative disorder with fluctuating cognition, bradykinesia, tremor. We had previously discussed Dementia with Lewy Bodies. He does not have any hallucinations. He appears to have had a significant response to low dose Sinemet, daughter has not noticed much tremor now, he has not had any of the episodes of fluctuating speech symptoms. Continue low dose Sinemet 25/100mg  BID. Her main concern today is his balance/gait difficulty, EMG/NCV confirmed severe  neuropathy, findings discussed at length with daughter today, this is likely idiopathic neuropathy which unfortunately has no cure and requires supportive care. She has difficulty understanding how he can have so severe neuropathy. We discussed that additional bloodwork for neuropathy can be done, check B1, B12, TSH, copper, SPEP, IFE, folate, heavy metals screen, however with idiopathic neuropathy, these are usually normal. She had several questions about botox for neuropathy, TENS unit, acupuncture, electronic light, infrared light,radiation beams and seed implants for neuropathy reflexology, orthothic devices (Moore balanced brace), and Metanx. I discussed with her that these are not recommended, but she is welcome to explore them at her own accord. Balance therapy would be for mobility, orthotic usually helpful if there is foot drop. Continue 24/7 care. Follow-up in 6 months, they know to call for any changes.    Follow Up Instructions:   -I discussed the assessment and treatment plan with the patient. The patient was provided an opportunity to ask questions and all were answered. The patient agreed with the plan and demonstrated an understanding of the instructions.   The patient was advised to call back or seek an in-person evaluation if the symptoms worsen or if the condition fails to improve as anticipated.    Total Time spent in visit with the patient was:  30 minutes, of which more than 50% of the time was spent in counseling and/or coordinating care on the above.   Pt understands and agrees with the plan of care outlined.     Cameron Sprang, MD

## 2018-08-08 ENCOUNTER — Ambulatory Visit: Payer: Medicare HMO | Admitting: Neurology

## 2018-08-08 NOTE — Progress Notes (Signed)
Remote pacemaker transmission.   

## 2018-08-09 ENCOUNTER — Other Ambulatory Visit: Payer: Medicare HMO | Admitting: Orthotics

## 2018-08-09 DIAGNOSIS — S0501XD Injury of conjunctiva and corneal abrasion without foreign body, right eye, subsequent encounter: Secondary | ICD-10-CM | POA: Diagnosis not present

## 2018-08-10 ENCOUNTER — Ambulatory Visit: Payer: Medicare HMO | Admitting: Orthotics

## 2018-08-10 ENCOUNTER — Other Ambulatory Visit: Payer: Self-pay

## 2018-08-10 ENCOUNTER — Encounter: Payer: Self-pay | Admitting: Internal Medicine

## 2018-08-10 DIAGNOSIS — G609 Hereditary and idiopathic neuropathy, unspecified: Secondary | ICD-10-CM

## 2018-08-10 DIAGNOSIS — M6788 Other specified disorders of synovium and tendon, other site: Secondary | ICD-10-CM

## 2018-08-10 NOTE — Progress Notes (Signed)
Talked to Derek Carr extensively and his daughter extensively about MBB (45 minutes); she had a lot of questions especially about peripheral idiopathic neuropathy, proprioception, falling archs, etc. (She is nurse with [employer]); She also had questions regarding financial responsibility, compliance issues, alternatives to braces.   I attempted to answer her questions and she reinterated this appointment was just to be an evaluation and not necessary casting.   She wants to do research on idiopathic neuropathy, balance issues before reaching a final decision.

## 2018-08-10 NOTE — Telephone Encounter (Signed)
error 

## 2018-08-11 ENCOUNTER — Telehealth: Payer: Self-pay | Admitting: Neurology

## 2018-08-11 NOTE — Telephone Encounter (Signed)
Tried calling daughter. No answer and phone said to try call again later.  I am not sure what lab orders the daughter is needing or why she needs them.  When she returns the call, please ask what she needs.

## 2018-08-11 NOTE — Telephone Encounter (Signed)
Derek Carr needs to know if we have mailed the orders out for the blood work to her yet  Please call and let her know the status

## 2018-08-14 ENCOUNTER — Telehealth: Payer: Self-pay

## 2018-08-14 DIAGNOSIS — G20 Parkinson's disease: Secondary | ICD-10-CM

## 2018-08-14 NOTE — Telephone Encounter (Signed)
Pts daughter would like for lab orders to printed and left up front for her to pick up.  She states that she and Dr. Delice Lesch had discussed at pts last visit that pt needed:  B1, B12 and folate, Copper, TSH, Heavy metals, and some tpe of protein ( I didn't understand what she said).  Dr. Delice Lesch,  Are you alright with this and what are the correct labs?

## 2018-08-14 NOTE — Telephone Encounter (Signed)
Left message advising lab orders are at the front office.

## 2018-08-14 NOTE — Telephone Encounter (Signed)
Yes, pls print out lab slip for TSH, B12, B1, copper, SPEP, IFE, folate. Thanks!

## 2018-08-15 ENCOUNTER — Telehealth: Payer: Self-pay | Admitting: Neurology

## 2018-08-15 DIAGNOSIS — G20 Parkinson's disease: Secondary | ICD-10-CM

## 2018-08-15 NOTE — Telephone Encounter (Signed)
Daughter is calling in about the Neprozil 10mg  @ bedtime medication. She said this would be the first time she would be filling this since switching providers. He is needing a refill now to the pharm on file. Thanks!

## 2018-08-15 NOTE — Telephone Encounter (Signed)
Patient Daughter* she also asked about if he needed to have his mercury levels checked since that was not on the req and was asking if you can just fax it over to [facility] if so. Thanks!

## 2018-08-15 NOTE — Telephone Encounter (Signed)
Patient stopped by to pick up lab req and was asking about medication. I told her you were at lunch and would call her back later on. Just FYI.

## 2018-08-16 ENCOUNTER — Other Ambulatory Visit: Payer: Self-pay | Admitting: Neurology

## 2018-08-16 MED ORDER — DONEPEZIL HCL 10 MG PO TABS: 10.0000 mg | ORAL_TABLET | Freq: Every day | ORAL | 1 refills | Status: DC

## 2018-08-16 NOTE — Telephone Encounter (Signed)
Spoke with patient daughter she was made aware Rx sent to pharmacy. Confirmation dose/ directions.  Lab order place she will pick up at front desk tomorrow

## 2018-08-16 NOTE — Telephone Encounter (Signed)
Do not see medication in patient current medication list for Neprozil 10 mg.  For clarification does she mean donepezil 10 mg? If so Rx was last filled by [facility] will you be taken this over? Ok to send ?  [place] for lab order for Mercury level?

## 2018-08-16 NOTE — Telephone Encounter (Signed)
I'm assuming this is Donepezil, ok to refill from our office. Looks like it was a different Heavy metal profile that was ordered, pls let daughter know that Heavy metal profile II (blood) checks for mercury, lead, arsenic, and cadmium. Pls order and give her lab slip, thanks

## 2018-08-16 NOTE — Addendum Note (Signed)
Addended by: Ranae Plumber on: 08/16/2018 01:52 PM   Modules accepted: Orders

## 2018-08-17 ENCOUNTER — Other Ambulatory Visit: Payer: Self-pay

## 2018-08-17 DIAGNOSIS — F0390 Unspecified dementia without behavioral disturbance: Secondary | ICD-10-CM

## 2018-08-17 DIAGNOSIS — G20 Parkinson's disease: Secondary | ICD-10-CM

## 2018-08-17 DIAGNOSIS — F0280 Dementia in other diseases classified elsewhere without behavioral disturbance: Secondary | ICD-10-CM

## 2018-08-17 DIAGNOSIS — G6289 Other specified polyneuropathies: Secondary | ICD-10-CM

## 2018-08-22 ENCOUNTER — Other Ambulatory Visit: Payer: Self-pay | Admitting: Neurology

## 2018-08-22 DIAGNOSIS — G20 Parkinson's disease: Secondary | ICD-10-CM | POA: Diagnosis not present

## 2018-08-22 DIAGNOSIS — G629 Polyneuropathy, unspecified: Secondary | ICD-10-CM | POA: Diagnosis not present

## 2018-08-22 DIAGNOSIS — R768 Other specified abnormal immunological findings in serum: Secondary | ICD-10-CM | POA: Diagnosis not present

## 2018-08-23 DIAGNOSIS — S0501XD Injury of conjunctiva and corneal abrasion without foreign body, right eye, subsequent encounter: Secondary | ICD-10-CM | POA: Diagnosis not present

## 2018-08-29 LAB — VITAMIN B1: Vitamin B1 (Thiamine): 12 nmol/L (ref 8–30)

## 2018-08-29 LAB — PROTEIN ELECTROPHORESIS, SERUM
Albumin ELP: 3.5 g/dL — ABNORMAL LOW (ref 3.8–4.8)
Alpha 1: 0.3 g/dL (ref 0.2–0.3)
Alpha 2: 0.8 g/dL (ref 0.5–0.9)
Beta 2: 0.7 g/dL — ABNORMAL HIGH (ref 0.2–0.5)
Beta Globulin: 0.4 g/dL (ref 0.4–0.6)
Gamma Globulin: 1.5 g/dL (ref 0.8–1.7)
Total Protein: 7.2 g/dL (ref 6.1–8.1)

## 2018-08-29 LAB — IMMUNOFIXATION ELECTROPHORESIS
IgG (Immunoglobin G), Serum: 1755 mg/dL — ABNORMAL HIGH (ref 600–1540)
IgM, Serum: 37 mg/dL — ABNORMAL LOW (ref 50–300)
Immunofix Electr Int: NOT DETECTED
Immunoglobulin A: 683 mg/dL — ABNORMAL HIGH (ref 70–320)

## 2018-08-29 LAB — TSH: TSH: 0.76 mIU/L (ref 0.40–4.50)

## 2018-08-29 LAB — HEAVY METALS PANEL, BLOOD
Arsenic: <10 mcg/L (ref <23)
Lead: 2 ug/dL (ref <5)
Mercury, B: <5 mcg/L (ref 0–10)

## 2018-08-29 LAB — B12 AND FOLATE PANEL
Folate: 23.3 ng/mL
Vitamin B-12: 914 pg/mL (ref 200–1100)

## 2018-08-29 LAB — COPPER, SERUM: Copper: 103 ug/dL (ref 70–175)

## 2018-08-29 LAB — CADMIUM, BLOOD: Cadmium: 0.7 mcg/L

## 2018-08-30 ENCOUNTER — Other Ambulatory Visit: Payer: Self-pay

## 2018-08-30 ENCOUNTER — Emergency Department (HOSPITAL_COMMUNITY)
Admission: EM | Admit: 2018-08-30 | Discharge: 2018-08-30 | Disposition: A | Discharge status: Home / Self Care | Payer: Medicare HMO | Attending: Emergency Medicine | Admitting: Emergency Medicine

## 2018-08-30 ENCOUNTER — Emergency Department (HOSPITAL_COMMUNITY): Payer: Medicare HMO

## 2018-08-30 ENCOUNTER — Encounter (HOSPITAL_COMMUNITY): Payer: Self-pay | Admitting: Emergency Medicine

## 2018-08-30 DIAGNOSIS — F0280 Dementia in other diseases classified elsewhere without behavioral disturbance: Secondary | ICD-10-CM | POA: Diagnosis not present

## 2018-08-30 DIAGNOSIS — Z96641 Presence of right artificial hip joint: Secondary | ICD-10-CM | POA: Insufficient documentation

## 2018-08-30 DIAGNOSIS — I129 Hypertensive chronic kidney disease with stage 1 through stage 4 chronic kidney disease, or unspecified chronic kidney disease: Secondary | ICD-10-CM | POA: Insufficient documentation

## 2018-08-30 DIAGNOSIS — S0990XA Unspecified injury of head, initial encounter: Secondary | ICD-10-CM

## 2018-08-30 DIAGNOSIS — Z79899 Other long term (current) drug therapy: Secondary | ICD-10-CM | POA: Insufficient documentation

## 2018-08-30 DIAGNOSIS — Z8673 Personal history of transient ischemic attack (TIA), and cerebral infarction without residual deficits: Secondary | ICD-10-CM | POA: Diagnosis not present

## 2018-08-30 DIAGNOSIS — S00212A Abrasion of left eyelid and periocular area, initial encounter: Secondary | ICD-10-CM | POA: Diagnosis not present

## 2018-08-30 DIAGNOSIS — S01112A Laceration without foreign body of left eyelid and periocular area, initial encounter: Secondary | ICD-10-CM | POA: Insufficient documentation

## 2018-08-30 DIAGNOSIS — Z7982 Long term (current) use of aspirin: Secondary | ICD-10-CM | POA: Diagnosis not present

## 2018-08-30 DIAGNOSIS — S199XXA Unspecified injury of neck, initial encounter: Secondary | ICD-10-CM | POA: Diagnosis not present

## 2018-08-30 DIAGNOSIS — Y92003 Bedroom of unspecified non-institutional (private) residence as the place of occurrence of the external cause: Secondary | ICD-10-CM | POA: Insufficient documentation

## 2018-08-30 DIAGNOSIS — N183 Chronic kidney disease, stage 3 (moderate): Secondary | ICD-10-CM | POA: Insufficient documentation

## 2018-08-30 DIAGNOSIS — J449 Chronic obstructive pulmonary disease, unspecified: Secondary | ICD-10-CM | POA: Insufficient documentation

## 2018-08-30 DIAGNOSIS — Z23 Encounter for immunization: Secondary | ICD-10-CM | POA: Insufficient documentation

## 2018-08-30 DIAGNOSIS — Z87891 Personal history of nicotine dependence: Secondary | ICD-10-CM | POA: Diagnosis not present

## 2018-08-30 DIAGNOSIS — S098XXA Other specified injuries of head, initial encounter: Secondary | ICD-10-CM | POA: Diagnosis not present

## 2018-08-30 DIAGNOSIS — Z950 Presence of cardiac pacemaker: Secondary | ICD-10-CM | POA: Insufficient documentation

## 2018-08-30 DIAGNOSIS — S161XXA Strain of muscle, fascia and tendon at neck level, initial encounter: Secondary | ICD-10-CM | POA: Diagnosis not present

## 2018-08-30 DIAGNOSIS — Y999 Unspecified external cause status: Secondary | ICD-10-CM | POA: Diagnosis not present

## 2018-08-30 DIAGNOSIS — Z8546 Personal history of malignant neoplasm of prostate: Secondary | ICD-10-CM | POA: Insufficient documentation

## 2018-08-30 DIAGNOSIS — Y9384 Activity, sleeping: Secondary | ICD-10-CM | POA: Diagnosis not present

## 2018-08-30 DIAGNOSIS — W06XXXA Fall from bed, initial encounter: Secondary | ICD-10-CM | POA: Insufficient documentation

## 2018-08-30 DIAGNOSIS — G308 Other Alzheimer's disease: Secondary | ICD-10-CM | POA: Diagnosis not present

## 2018-08-30 DIAGNOSIS — W19XXXA Unspecified fall, initial encounter: Secondary | ICD-10-CM

## 2018-08-30 LAB — CBC WITH DIFFERENTIAL/PLATELET
Abs Immature Granulocytes: 0.01 10*3/uL (ref 0.00–0.07)
Basophils Absolute: 0 10*3/uL (ref 0.0–0.1)
Basophils Relative: 0 %
Eosinophils Absolute: 0.3 10*3/uL (ref 0.0–0.5)
Eosinophils Relative: 9 %
HCT: 35.7 % — ABNORMAL LOW (ref 39.0–52.0)
Hemoglobin: 11.6 g/dL — ABNORMAL LOW (ref 13.0–17.0)
Immature Granulocytes: 0 %
Lymphocytes Relative: 27 %
Lymphs Abs: 0.9 10*3/uL (ref 0.7–4.0)
MCH: 28.8 pg (ref 26.0–34.0)
MCHC: 32.5 g/dL (ref 30.0–36.0)
MCV: 88.6 fL (ref 80.0–100.0)
Monocytes Absolute: 0.3 10*3/uL (ref 0.1–1.0)
Monocytes Relative: 9 %
Neutro Abs: 1.8 10*3/uL (ref 1.7–7.7)
Neutrophils Relative %: 55 %
Platelets: 195 10*3/uL (ref 150–400)
RBC: 4.03 MIL/uL — ABNORMAL LOW (ref 4.22–5.81)
RDW: 14.9 % (ref 11.5–15.5)
WBC: 3.3 10*3/uL — ABNORMAL LOW (ref 4.0–10.5)
nRBC: 0 % (ref 0.0–0.2)

## 2018-08-30 LAB — COMPREHENSIVE METABOLIC PANEL
ALT: 11 U/L (ref 0–44)
AST: 32 U/L (ref 15–41)
Albumin: 3.1 g/dL — ABNORMAL LOW (ref 3.5–5.0)
Alkaline Phosphatase: 78 U/L (ref 38–126)
Anion gap: 7 (ref 5–15)
BUN: 18 mg/dL (ref 8–23)
CO2: 24 mmol/L (ref 22–32)
Calcium: 9.5 mg/dL (ref 8.9–10.3)
Chloride: 111 mmol/L (ref 98–111)
Creatinine, Ser: 1.5 mg/dL — ABNORMAL HIGH (ref 0.61–1.24)
GFR calc Af Amer: 49 mL/min — ABNORMAL LOW (ref >60)
GFR calc non Af Amer: 42 mL/min — ABNORMAL LOW (ref >60)
Glucose, Bld: 116 mg/dL — ABNORMAL HIGH (ref 70–99)
Potassium: 4.1 mmol/L (ref 3.5–5.1)
Sodium: 142 mmol/L (ref 135–145)
Total Bilirubin: 0.3 mg/dL (ref 0.3–1.2)
Total Protein: 7.1 g/dL (ref 6.5–8.1)

## 2018-08-30 MED ORDER — TETANUS-DIPHTH-ACELL PERTUSSIS 5-2.5-18.5 LF-MCG/0.5 IM SUSP
0.5000 mL | Freq: Once | INTRAMUSCULAR | Status: AC
  Administered 2018-08-30: 0.5 mL via INTRAMUSCULAR
  Filled 2018-08-30: qty 0.5

## 2018-08-30 NOTE — Discharge Instructions (Addendum)
Local wound care with bacitracin and dressing changes twice daily.  Return to the emergency department for severe headache, neck pain, or other new and concerning symptoms.

## 2018-08-30 NOTE — ED Triage Notes (Addendum)
Patient fell out of bed this morning and hit the corner of his left eye on a bedside table.  Patient is CAOx4, remembers incident and denies being on a blood thinner.  Bleeding controlled.

## 2018-08-30 NOTE — ED Provider Notes (Signed)
[facility] EMERGENCY DEPARTMENT Provider Note   CSN: [id number] Arrival date & time: 08/30/18  0405     History   Chief Complaint Chief Complaint  Patient presents with   Fall    HPI Derek Carr is a 85 y.o. male.     Patient is an 85 year old male with past medical history of abdominal aortic aneurysm, hypertension, hyperlipidemia.  He presents today for evaluation of fall.  Patient was asleep in bed dreaming when he fell out of bed.  He apparently struck his left eyebrow on the nightstand.  There was no loss of consciousness.  Patient denies headache or neck pain.  He does have a small abrasion lateral to the left eye/eyebrow.  The history is provided by the patient.  Fall This is a new problem. The current episode started 1 to 2 hours ago. The problem occurs constantly. The problem has not changed since onset.Pertinent negatives include no chest pain and no headaches. Nothing aggravates the symptoms. Nothing relieves the symptoms. He has tried nothing for the symptoms.    Past Medical History:  Diagnosis Date   AAA (abdominal aortic aneurysm) (HCC)    Anemia    Ankle fracture    left   Aortic regurgitation    Atherosclerosis    Complication of anesthesia    CONFUSION - Pt's family very concerned about this   DDD (degenerative disc disease)    Degenerative arthritis    Dermatitis, atopic ARMS AND LEGS   Diverticulosis    Erectile dysfunction    Frequency of urination    Glaucoma    History of asbestos exposure    History of radiation therapy 8/19-13-10/18/11   prostate, 60 GY   History of shingles 2012-- BILATERAL EYES--  NO RESIDUAL   Hyperlipidemia    Hypertension    Inguinal hernia    Lumbar scoliosis    Nocturia    OA (osteoarthritis) of hip RIGHT   Prostate cancer ([place]) 05/11/2011   bx=Adenocarcinoma,gleason3+4=7, 4+4=8,PSA=10.30volume=45.7cc   Renal cyst    bilateral   Smokers' cough ([place])    Stroke [facility])     Thyroid cyst     Patient Active Problem List   Diagnosis Date Noted   Vascular parkinsonism ([place]) 05/31/2018   Bradycardia 09/16/2017   CKD (chronic kidney disease), stage III ([place]) 09/15/2017   Near syncope 09/14/2017   Post-operative pain    Dementia without behavioral disturbance (HCC)    TIA (transient ischemic attack)    Reactive hypertension    Bimalleolar fracture of left ankle 05/03/2017   Axonal neuropathy 03/21/2017   Right sided weakness 03/01/2017   Cigarette smoker 06/16/2016   Dyspnea on exertion 06/15/2016   Alzheimer disease ([place]) 01/07/2014   CSA (central sleep apnea) 10/05/2013   Syncope 06/26/2013   COPD GOLD II 06/25/2013   BPH (benign prostatic hyperplasia) 05/18/2013   Glaucoma 05/18/2013   S/P right TH revision 05/14/2013   Constipation 03/26/2013   Osteoporosis 03/26/2013   S/P right THA, AA 03/20/2013   Hyperlipidemia    Erectile dysfunction    History of asbestos exposure    Nocturia    History of shingles    Dermatitis, atopic    OA (osteoarthritis) of hip    Arthritis    Frequency of urination    Lumbar scoliosis    Malignant neoplasm of prostate ([place]) 06/07/2011   85 year old gentleman with stage T3 adenocarcinoma prostate with Gleason score 4+4 and PSA of 10.3 05/11/2011    Past  Surgical History:  Procedure Laterality Date   ATTEMPTED LEFT VATS/ LEFT THORACOTOMY/ RESECTION OF THE ENORMOUS, PROBABLE BRONCHOGENIC CYST  01-04-2005  DR Arlyce Dice   CARDIAC CATHETERIZATION  02-24-2009  DR NASHER   MINOR CORONARY ARTERY IRREGULARITIES/ NORMAL LVSF/ EF 65-70%   CATARACT EXTRACTION     COLONOSCOPY W/ POLYPECTOMY     CYSTOSCOPY  11/15/2011   Procedure: CYSTOSCOPY;  Surgeon: Dutch Gray, MD;  Location: [facility];  Service: Urology;  Laterality: N/A;  no seeds seen in bladder   esophageus cyst removal  YRS AGO   LUMBAR SPINE SURGERY  1991   ORIF ANKLE FRACTURE Left 05/03/2017   ORIF  ANKLE FRACTURE Left 05/03/2017   Procedure: OPEN REDUCTION INTERNAL FIXATION (ORIF) BIMALLEOLAR  ANKLE FRACTURE;  Surgeon: Wylene Simmer, MD;  Location: [facility];  Service: Orthopedics;  Laterality: Left;   PACEMAKER IMPLANT N/A 09/16/2017   Procedure: PACEMAKER IMPLANT;  Surgeon: Constance Haw, MD;  Location: [facility];  Service: Cardiovascular;  Laterality: N/A;   PROSTATE BIOPSY  05/11/11   Adenocarcinoma ([facility] OFF[facility])   RADIOACTIVE SEED IMPLANT  11/15/2011   Procedure: RADIOACTIVE SEED IMPLANT;  Surgeon: Dutch Gray, MD;  Location: [facility];  Service: Urology;  Laterality: N/A;  Total number of seeds - 52   REPAIR RIGHT INGUINAL HERNIA W/ MESH  09-10-1999   TOTAL HIP ARTHROPLASTY Right 03/20/2013   Procedure: RIGHT TOTAL HIP ARTHROPLASTY ANTERIOR APPROACH;  Surgeon: [name], MD;  Location: [facility];  Service: Orthopedics;  Laterality: Right;   TOTAL HIP REVISION Right 05/14/2013   Procedure: open reduction internal fixation REVISION RIGHT HIP ;  Surgeon: [name], MD;  Location: [facility];  Service: Orthopedics;  Laterality: Right;   UMBILICAL HERNIA REPAIR  1998   epigastric        Home Medications    Prior to Admission medications   Medication Sig Start Date End Date Taking? Authorizing Provider  acetaminophen (TYLENOL) 500 MG tablet Take 1,000 mg by mouth 2 (two) times daily.     [provider]  Ascorbic Acid (VITAMIN C) 1000 MG tablet Take 1,000 mg by mouth daily.    [provider]  aspirin EC 81 MG tablet Take 81 mg by mouth daily.    [provider]  b complex vitamins tablet Take 1 tablet by mouth daily.    [provider]  Calcium Carbonate Antacid (TUMS PO) Take 1 tablet by mouth daily.    [provider]  carbidopa-levodopa (SINEMET IR) 25-100 MG tablet Take 1.5 tablet three times a day with meals Patient taking differently: Take 1 tablet by mouth 2 (two) times a day.  05/23/18   Cameron Sprang, MD  cetirizine (ZYRTEC) 10 MG tablet Take 10 mg by mouth daily.    [provider]  Cholecalciferol (VITAMIN D-3) 125 MCG (5000 UT) TABS Take 1 capsule by mouth 2 (two) times daily.     [provider]  clobetasol cream (TEMOVATE) 8.65 % Apply 1 application topically daily as needed.  09/07/17   [provider]  Docosahexaenoic Acid (DHA OMEGA 3 PO) Take 1,000 mg by mouth daily.     [provider]  donepezil (ARICEPT) 10 MG tablet Take 1 tablet (10 mg total) by mouth at bedtime. 08/16/18   Cameron Sprang, MD  folic acid (FOLVITE) 1 MG tablet Take 1 mg by mouth daily.    [provider]  gabapentin (NEURONTIN) 100 MG capsule  Take 1 capsule (100 mg total) by mouth at bedtime. 06/21/18   Cameron Sprang, MD  irbesartan (AVAPRO) 75 MG tablet Take 75 mg by mouth at bedtime. **REPLACES VALSARTAN** 11/28/16   [provider]  lidocaine-prilocaine (EMLA) cream Apply 1 application topically as needed. 06/08/18   Dohmeier, Asencion Partridge, MD  NONFORMULARY OR COMPOUNDED ITEM [facility] Neuropathy Cream 120 grams 3 refills  Sent in 07-04-18    [provider]  Probiotic Product (PROBIOTIC PO) Take 1 capsule by mouth daily.     [provider]  Ubiquinol (QUNOL COQ10/UBIQUINOL/MEGA) 100 MG CAPS Take 1 tablet by mouth at bedtime.     [provider]  umeclidinium-vilanterol (ANORO ELLIPTA) 62.5-25 MCG/INH AEPB Only open the device one time and then take your two separate drags to be sure you get it all 01/19/18   Tanda Rockers, MD  UNABLE TO FIND Med Name: CPAP per Dr Beacher May    [provider]  valACYclovir (VALTREX) 1000 MG tablet Take 1,000 mg by mouth daily.    [provider]    Family History Family History  Problem Relation Age of Onset   Hypertension Mother    Prostate cancer Brother        surgery/cured   Lung cancer Brother        new primary/same brother    Social History Social History    Tobacco Use   Smoking status: Former Smoker    Packs/day: 0.50    Years: 62.00    Pack years: 31.00    Types: Cigarettes   Smokeless tobacco: Never Used   Tobacco comment: smoke one or two per day  Substance Use Topics   Alcohol use: No    Alcohol/week: 1.0 standard drinks    Types: 1 Cans of beer per week   Drug use: No    Comment: quit smoking 02/2012     Allergies   Oxycodone, Dilaudid [hydromorphone hcl], Methocarbamol, Percodan [oxycodone-aspirin], Tramadol, and Vicodin [hydrocodone-acetaminophen]   Review of Systems Review of Systems  Cardiovascular: Negative for chest pain.  Neurological: Negative for headaches.  All other systems reviewed and are negative.    Physical Exam Updated Vital Signs BP (!) 147/86 (BP Location: Right Arm)    Pulse 61    Resp 18    SpO2 100%   Physical Exam Vitals signs and nursing note reviewed.  Constitutional:      General: He is not in acute distress.    Appearance: He is well-developed. He is not diaphoretic.  HENT:     Head: Normocephalic.     Comments: There is a 1 cm, superficial, well approximated laceration to the left lateral eyebrow. Eyes:     Extraocular Movements: Extraocular movements intact.     Pupils: Pupils are equal, round, and reactive to light.  Neck:     Musculoskeletal: Normal range of motion and neck supple.  Cardiovascular:     Rate and Rhythm: Normal rate and regular rhythm.     Heart sounds: No murmur. No friction rub.  Pulmonary:     Effort: Pulmonary effort is normal. No respiratory distress.     Breath sounds: Normal breath sounds. No wheezing or rales.  Abdominal:     General: Bowel sounds are normal. There is no distension.     Palpations: Abdomen is soft.     Tenderness: There is no abdominal tenderness.  Musculoskeletal: Normal range of motion.        General: No swelling, tenderness, deformity  or signs of injury.  Skin:    General: Skin is warm and dry.  Neurological:     General:  No focal deficit present.     Mental Status: He is alert and oriented to person, place, and time.     Cranial Nerves: No cranial nerve deficit.     Motor: No weakness.     Coordination: Coordination normal.      ED Treatments / Results  Labs (all labs ordered are listed, but only abnormal results are displayed) Labs Reviewed  CBC WITH DIFFERENTIAL/PLATELET - Abnormal; Notable for the following components:      Result Value   WBC 3.3 (*)    RBC 4.03 (*)    Hemoglobin 11.6 (*)    HCT 35.7 (*)    All other components within normal limits  COMPREHENSIVE METABOLIC PANEL - Abnormal; Notable for the following components:   Glucose, Bld 116 (*)    Creatinine, Ser 1.50 (*)    Albumin 3.1 (*)    GFR calc non Af Amer 42 (*)    GFR calc Af Amer 49 (*)    All other components within normal limits    EKG None  Radiology Ct Head Wo Contrast  Result Date: 08/30/2018 CLINICAL DATA:  Fall with laceration near the left eye EXAM: CT HEAD WITHOUT CONTRAST CT CERVICAL SPINE WITHOUT CONTRAST TECHNIQUE: Multidetector CT imaging of the head and cervical spine was performed following the standard protocol without intravenous contrast. Multiplanar CT image reconstructions of the cervical spine were also generated. COMPARISON:  02/02/2018 brain MRI FINDINGS: CT HEAD FINDINGS Brain: No evidence of acute infarction, hemorrhage, hydrocephalus, extra-axial collection or mass lesion/mass effect. Mild cerebral volume loss and chronic small vessel ischemic change. Vascular: Atherosclerotic calcification with vertebrobasilar tortuosity. Skull: Negative for fracture Sinuses/Orbits: No evident injury.  Bilateral cataract resection. CT CERVICAL SPINE FINDINGS Alignment: No traumatic malalignment Skull base and vertebrae: Negative for fracture Soft tissues and spinal canal: No prevertebral fluid or swelling. No visible canal hematoma. Flattened hyoid which could be developmental or from remote trauma-stable from 2019 neck  CTA. Disc levels: Diffuse degenerative disc narrowing and ridging. Upper and asymmetric left degenerative facet spurring. No evident cord impingement Upper chest: No acute finding IMPRESSION: No evidence of acute intracranial injury or cervical spine fracture. Electronically Signed   By: Monte Fantasia M.D.   On: 08/30/2018 05:33   Ct Cervical Spine Wo Contrast  Result Date: 08/30/2018 CLINICAL DATA:  Fall with laceration near the left eye EXAM: CT HEAD WITHOUT CONTRAST CT CERVICAL SPINE WITHOUT CONTRAST TECHNIQUE: Multidetector CT imaging of the head and cervical spine was performed following the standard protocol without intravenous contrast. Multiplanar CT image reconstructions of the cervical spine were also generated. COMPARISON:  02/02/2018 brain MRI FINDINGS: CT HEAD FINDINGS Brain: No evidence of acute infarction, hemorrhage, hydrocephalus, extra-axial collection or mass lesion/mass effect. Mild cerebral volume loss and chronic small vessel ischemic change. Vascular: Atherosclerotic calcification with vertebrobasilar tortuosity. Skull: Negative for fracture Sinuses/Orbits: No evident injury.  Bilateral cataract resection. CT CERVICAL SPINE FINDINGS Alignment: No traumatic malalignment Skull base and vertebrae: Negative for fracture Soft tissues and spinal canal: No prevertebral fluid or swelling. No visible canal hematoma. Flattened hyoid which could be developmental or from remote trauma-stable from 2019 neck CTA. Disc levels: Diffuse degenerative disc narrowing and ridging. Upper and asymmetric left degenerative facet spurring. No evident cord impingement Upper chest: No acute finding IMPRESSION: No evidence of acute intracranial injury or cervical spine fracture. Electronically Signed  By: Monte Fantasia M.D.   On: 08/30/2018 05:33    Procedures Procedures (including critical care time)  Medications Ordered in ED Medications - No data to display   Initial Impression / Assessment and Plan /  ED Course  I have reviewed the triage vital signs and the nursing notes.  Pertinent labs & imaging results that were available during my care of the patient were reviewed by me and considered in my medical decision making (see chart for details).  Patient presenting after fall.  He apparently struck his head on the nightstand when he fell out of bed while dreaming.  There is no reported loss of consciousness and he denies any other injury.  CT scans of the head and cervical spine are unremarkable.  Laboratory studies are normal.  At this point, I see no indication for further work-up.  Patient to be discharged with PRN return.  Daughter concerned about the possibility of occult fracture either in his hip or legs.  I see no evidence of deformity or any abnormality patient has full range of motion and is able to lift his legs off the bed without any difficulty.  Final Clinical Impressions(s) / ED Diagnoses   Final diagnoses:  None    ED Discharge Orders    None       Veryl Speak, MD 08/30/18 [phone]

## 2018-09-01 ENCOUNTER — Telehealth: Payer: Self-pay | Admitting: Podiatry

## 2018-09-01 NOTE — Telephone Encounter (Signed)
pts daughter called asking if we could check benefits for the brace before they come in to be measured.  I told pt that I would call her next week with benefit information.Marland Kitchen

## 2018-09-04 ENCOUNTER — Telehealth: Payer: Self-pay | Admitting: Neurology

## 2018-09-04 NOTE — Telephone Encounter (Signed)
Pt daughter would like the results of the Blood work

## 2018-09-04 NOTE — Telephone Encounter (Signed)
Dr. Delice Lesch,  What do I need to inform the daughter about test results?

## 2018-09-05 NOTE — Telephone Encounter (Signed)
Pls let daughter know that the bloodwork I ordered was normal, negative for heavy metals, vitamin levels normal. Thanks

## 2018-09-05 NOTE — Telephone Encounter (Signed)
Daughter informed of normal results.

## 2018-09-06 NOTE — Telephone Encounter (Signed)
Eft message for pts daughter that I called [employer] and pt would have a 20% coinsurance and that would be estimated at around 450.00 and to call to discuss further.

## 2018-09-08 ENCOUNTER — Telehealth: Payer: Self-pay | Admitting: Neurology

## 2018-09-08 NOTE — Telephone Encounter (Signed)
Labs faxed to Dr. Joylene Draft at [phone]

## 2018-09-08 NOTE — Telephone Encounter (Signed)
Daughter is calling in about blood work at [facility] 08/22/18 and she is needing the results faxed to PCP Dr. Joylene Draft. Thanks!

## 2018-09-14 DIAGNOSIS — S0501XD Injury of conjunctiva and corneal abrasion without foreign body, right eye, subsequent encounter: Secondary | ICD-10-CM | POA: Diagnosis not present

## 2018-09-26 ENCOUNTER — Ambulatory Visit: Payer: Medicare HMO | Admitting: Orthotics

## 2018-09-26 ENCOUNTER — Other Ambulatory Visit: Payer: Self-pay

## 2018-09-26 DIAGNOSIS — G609 Hereditary and idiopathic neuropathy, unspecified: Secondary | ICD-10-CM

## 2018-09-26 NOTE — Progress Notes (Signed)
Could not cast him today b/c scanner is out as well not having adequate skin barriers.  Reschedule for next week.

## 2018-10-03 ENCOUNTER — Other Ambulatory Visit: Payer: Medicare HMO | Admitting: Orthotics

## 2018-10-04 DIAGNOSIS — S0501XD Injury of conjunctiva and corneal abrasion without foreign body, right eye, subsequent encounter: Secondary | ICD-10-CM | POA: Diagnosis not present

## 2018-10-04 DIAGNOSIS — H16223 Keratoconjunctivitis sicca, not specified as Sjogren's, bilateral: Secondary | ICD-10-CM | POA: Diagnosis not present

## 2018-10-04 DIAGNOSIS — H04123 Dry eye syndrome of bilateral lacrimal glands: Secondary | ICD-10-CM | POA: Diagnosis not present

## 2018-10-11 DIAGNOSIS — M3501 Sicca syndrome with keratoconjunctivitis: Secondary | ICD-10-CM | POA: Diagnosis not present

## 2018-10-11 DIAGNOSIS — G603 Idiopathic progressive neuropathy: Secondary | ICD-10-CM | POA: Diagnosis not present

## 2018-10-11 DIAGNOSIS — Z79899 Other long term (current) drug therapy: Secondary | ICD-10-CM | POA: Diagnosis not present

## 2018-10-11 DIAGNOSIS — R768 Other specified abnormal immunological findings in serum: Secondary | ICD-10-CM | POA: Diagnosis not present

## 2018-10-23 DIAGNOSIS — L308 Other specified dermatitis: Secondary | ICD-10-CM | POA: Diagnosis not present

## 2018-10-26 ENCOUNTER — Other Ambulatory Visit: Payer: Medicare HMO | Admitting: Orthotics

## 2018-10-27 DIAGNOSIS — R768 Other specified abnormal immunological findings in serum: Secondary | ICD-10-CM | POA: Diagnosis not present

## 2018-10-27 DIAGNOSIS — Z79899 Other long term (current) drug therapy: Secondary | ICD-10-CM | POA: Diagnosis not present

## 2018-10-27 DIAGNOSIS — M3501 Sicca syndrome with keratoconjunctivitis: Secondary | ICD-10-CM | POA: Diagnosis not present

## 2018-10-27 DIAGNOSIS — G603 Idiopathic progressive neuropathy: Secondary | ICD-10-CM | POA: Diagnosis not present

## 2018-11-06 ENCOUNTER — Encounter: Payer: Self-pay | Admitting: Physical Therapy

## 2018-11-06 ENCOUNTER — Ambulatory Visit (INDEPENDENT_AMBULATORY_CARE_PROVIDER_SITE_OTHER): Payer: Medicare HMO | Admitting: *Deleted

## 2018-11-06 DIAGNOSIS — R001 Bradycardia, unspecified: Secondary | ICD-10-CM | POA: Diagnosis not present

## 2018-11-06 DIAGNOSIS — G459 Transient cerebral ischemic attack, unspecified: Secondary | ICD-10-CM

## 2018-11-06 LAB — CUP PACEART REMOTE DEVICE CHECK
Battery Remaining Longevity: 156 mo
Battery Voltage: 3.05 V
Brady Statistic AP VP Percent: 0.02 %
Brady Statistic AP VS Percent: 72.45 %
Brady Statistic AS VP Percent: 0.02 %
Brady Statistic AS VS Percent: 27.51 %
Brady Statistic RA Percent Paced: 72.32 %
Brady Statistic RV Percent Paced: 0.04 %
Date Time Interrogation Session: 20201012045238
Implantable Lead Implant Date: 20190823
Implantable Lead Implant Date: 20190823
Implantable Lead Location: 753859
Implantable Lead Location: 753860
Implantable Lead Model: 5076
Implantable Lead Model: 5076
Implantable Pulse Generator Implant Date: 20190823
Lead Channel Impedance Value: 304 Ohm
Lead Channel Impedance Value: 342 Ohm
Lead Channel Impedance Value: 361 Ohm
Lead Channel Impedance Value: 456 Ohm
Lead Channel Pacing Threshold Amplitude: 0.625 V
Lead Channel Pacing Threshold Amplitude: 0.875 V
Lead Channel Pacing Threshold Pulse Width: 0.4 ms
Lead Channel Pacing Threshold Pulse Width: 0.4 ms
Lead Channel Sensing Intrinsic Amplitude: 10.75 mV
Lead Channel Sensing Intrinsic Amplitude: 10.75 mV
Lead Channel Sensing Intrinsic Amplitude: 2.125 mV
Lead Channel Sensing Intrinsic Amplitude: 2.125 mV
Lead Channel Setting Pacing Amplitude: 1.5 V
Lead Channel Setting Pacing Amplitude: 2.5 V
Lead Channel Setting Pacing Pulse Width: 0.4 ms
Lead Channel Setting Sensing Sensitivity: 1.2 mV

## 2018-11-06 NOTE — Therapy (Signed)
[facility] [address] [place], [place], [address] Phone: [phone]   Fax:  [phone]  Patient Details  Name: Derek Carr MRN: [id number] Date of Birth: 1931-06-06 Referring Provider:  Mechele Claude, PA  Encounter Date: 11/06/2018  PHYSICAL THERAPY DISCHARGE SUMMARY  Visits from Start of Care: 59  Current functional level related to goals / functional outcomes: See last PT note on 04/12/2018   Remaining deficits: [facility] initially closed due to [place] pandemic and reopened with new protocols. Patient did not feel comfortable returning to clinic and has not called to reschedule further appointments.    Education / Equipment: HEP & Fall Prevention Strategies.   Plan: Patient agrees to discharge.  Patient goals were not met. Patient is being discharged due to not returning since the last visit.  ?????          Elvie Palomo PT, DPT 11/06/2018, 12:09 PM  [facility] [address] [place], [place], [address] Phone: [phone]   Fax:  [phone]

## 2018-11-15 NOTE — Progress Notes (Signed)
Remote pacemaker transmission.   

## 2018-11-21 ENCOUNTER — Telehealth: Payer: Self-pay

## 2018-11-21 NOTE — Telephone Encounter (Signed)
I called pts daughter [name] that her father has appt with Janett Billow NP on 12/05/2018. I stated per Janett Billow NP pt can continue following up with Dr. Delice Lesch for overall neurological issues where he is established as a patient. I stated he will still be following up with Dr. Brett Fairy for sleep issues. I stated the appt in December 05, 2018  with Janett Billow NP will be cancel. The daughter verbalized understanding and appreciate the call.PT has seen Dr. Delice Lesch since 03/2018 and has an appt in December 2020.

## 2018-11-22 DIAGNOSIS — Z8546 Personal history of malignant neoplasm of prostate: Secondary | ICD-10-CM | POA: Diagnosis not present

## 2018-11-23 ENCOUNTER — Encounter: Payer: Self-pay | Admitting: Internal Medicine

## 2018-11-23 ENCOUNTER — Other Ambulatory Visit: Payer: Self-pay

## 2018-11-23 ENCOUNTER — Ambulatory Visit (INDEPENDENT_AMBULATORY_CARE_PROVIDER_SITE_OTHER): Payer: Medicare HMO | Admitting: Internal Medicine

## 2018-11-23 DIAGNOSIS — I495 Sick sinus syndrome: Secondary | ICD-10-CM | POA: Diagnosis not present

## 2018-11-23 DIAGNOSIS — Z950 Presence of cardiac pacemaker: Secondary | ICD-10-CM

## 2018-11-23 DIAGNOSIS — M3500 Sicca syndrome, unspecified: Secondary | ICD-10-CM | POA: Diagnosis not present

## 2018-11-23 NOTE — Patient Instructions (Signed)

## 2018-11-23 NOTE — Progress Notes (Signed)
HPI Derek Carr returns today for followup of his medtronic DDD PM inserted due to chronotropic incompetence. He is a pleasant 85 yo man with a stroke who had a PPM placed approx one year ago. In the interim, he has . He has known diastolic dysfunction and CoPD. He has been diagnosed with Sjogren's syndrome. His daughter who is with him today notes that his rhuematologist would like to try him on plaquenil. He has had a couple of falls. No injury. Allergies  Allergen Reactions  . Oxycodone Other (See Comments)  . Dilaudid [Hydromorphone Hcl] Other (See Comments)    Confusion   . Methocarbamol Other (See Comments)    Drowsiness   . Percodan [Oxycodone-Aspirin] Other (See Comments)    Confusion   . Tramadol Other (See Comments)    Drowsiness   . Vicodin [Hydrocodone-Acetaminophen] Other (See Comments)    Confusion      Current Outpatient Medications  Medication Sig Dispense Refill  . acetaminophen (TYLENOL) 500 MG tablet Take 1,000 mg by mouth 2 (two) times daily.     . Ascorbic Acid (VITAMIN C) 1000 MG tablet Take 1,000 mg by mouth daily.    Marland Kitchen aspirin EC 81 MG tablet Take 81 mg by mouth daily.    Marland Kitchen b complex vitamins tablet Take 1 tablet by mouth daily.    . Calcium Carbonate Antacid (TUMS PO) Take 1 tablet by mouth daily.    . carbidopa-levodopa (SINEMET IR) 25-100 MG tablet Take 1 tablet by mouth 2 (two) times daily.    . Cholecalciferol (VITAMIN D-3) 125 MCG (5000 UT) TABS Take 1 capsule by mouth 2 (two) times daily.     . clobetasol cream (TEMOVATE) [id number] % Apply 1 application topically daily as needed.     . Docosahexaenoic Acid (DHA OMEGA 3 PO) Take 1,000 mg by mouth daily.     Marland Kitchen donepezil (ARICEPT) 10 MG tablet Take 1 tablet (10 mg total) by mouth at bedtime. 90 tablet 1  . folic acid (FOLVITE) 1 MG tablet Take 1 mg by mouth daily.    Marland Kitchen gabapentin (NEURONTIN) 100 MG capsule Take 1 capsule (100 mg total) by mouth at bedtime. 90 capsule 1  . L-Tyrosine 500 MG CAPS  Take 1 capsule by mouth daily.    Marland Kitchen lidocaine-prilocaine (EMLA) cream Apply 1 application topically as needed. 30 g 1  . NONFORMULARY OR COMPOUNDED ITEM [facility] Neuropathy Cream 120 grams 3 refills  Sent in 07-04-18    . Omega 3 1000 MG CAPS Take 1 tablet by mouth daily.    . predniSONE (DELTASONE) 1 MG tablet Take 1 tablet by mouth daily.    . Probiotic Product (PROBIOTIC PO) Take 1 capsule by mouth daily.     Marland Kitchen Ubiquinol (QUNOL COQ10/UBIQUINOL/MEGA) 100 MG CAPS Take 1 tablet by mouth at bedtime.     Marland Kitchen umeclidinium-vilanterol (ANORO ELLIPTA) 62.5-25 MCG/INH AEPB Only open the device one time and then take your two separate drags to be sure you get it all 1 each 11  . UNABLE TO FIND Med Name: CPAP per Dr Beacher May    . valACYclovir (VALTREX) 1000 MG tablet Take 1,000 mg by mouth daily.     No current facility-administered medications for this visit.      Past Medical History:  Diagnosis Date  . AAA (abdominal aortic aneurysm) ([place])   . Anemia   . Ankle fracture    left  . Aortic regurgitation   . Atherosclerosis   .  Complication of anesthesia    CONFUSION - Pt's family very concerned about this  . DDD (degenerative disc disease)   . Degenerative arthritis   . Dermatitis, atopic ARMS AND LEGS  . Diverticulosis   . Erectile dysfunction   . Frequency of urination   . Glaucoma   . History of asbestos exposure   . History of radiation therapy 8/19-13-10/18/11   prostate, 83 GY  . History of shingles 2012-- BILATERAL EYES--  NO RESIDUAL  . Hyperlipidemia   . Hypertension   . Inguinal hernia   . Lumbar scoliosis   . Nocturia   . OA (osteoarthritis) of hip RIGHT  . Prostate cancer ([place]) 05/11/2011   bx=Adenocarcinoma,gleason3+4=7, 4+4=8,PSA=10.30volume=45.7cc  . Renal cyst    bilateral  . Smokers' cough ([place])   . Stroke ([place])   . Thyroid cyst     ROS:   All systems reviewed and negative except as noted in the HPI.   Past Surgical History:  Procedure Laterality Date   . ATTEMPTED LEFT VATS/ LEFT THORACOTOMY/ RESECTION OF THE ENORMOUS, PROBABLE BRONCHOGENIC CYST  01-04-2005  DR Arlyce Dice  . CARDIAC CATHETERIZATION  02-24-2009  DR NASHER   MINOR CORONARY ARTERY IRREGULARITIES/ NORMAL LVSF/ EF 65-70%  . CATARACT EXTRACTION    . COLONOSCOPY W/ POLYPECTOMY    . CYSTOSCOPY  11/15/2011   Procedure: CYSTOSCOPY;  Surgeon: Dutch Gray, MD;  Location: [facility];  Service: Urology;  Laterality: N/A;  no seeds seen in bladder  . esophageus cyst removal  YRS AGO  . [place]  . ORIF ANKLE FRACTURE Left 05/03/2017  . ORIF ANKLE FRACTURE Left 05/03/2017   Procedure: OPEN REDUCTION INTERNAL FIXATION (ORIF) BIMALLEOLAR  ANKLE FRACTURE;  Surgeon: Wylene Simmer, MD;  Location: [place];  Service: Orthopedics;  Laterality: Left;  . PACEMAKER IMPLANT N/A 09/16/2017   Procedure: PACEMAKER IMPLANT;  Surgeon: Constance Haw, MD;  Location: [place] [facility];  Service: Cardiovascular;  Laterality: N/A;  . PROSTATE BIOPSY  05/11/11   Adenocarcinoma (MD OFFICE)  . RADIOACTIVE SEED IMPLANT  11/15/2011   Procedure: RADIOACTIVE SEED IMPLANT;  Surgeon: Dutch Gray, MD;  Location: [facility];  Service: Urology;  Laterality: N/A;  Total number of seeds - 52  . REPAIR RIGHT INGUINAL HERNIA W/ MESH  09-10-1999  . TOTAL HIP ARTHROPLASTY Right 03/20/2013   Procedure: RIGHT TOTAL HIP ARTHROPLASTY ANTERIOR APPROACH;  Surgeon: [name], MD;  Location: [facility];  Service: Orthopedics;  Laterality: Right;  . TOTAL HIP REVISION Right 05/14/2013   Procedure: open reduction internal fixation REVISION RIGHT HIP ;  Surgeon: [name], MD;  Location: [facility];  Service: Orthopedics;  Laterality: Right;  . UMBILICAL HERNIA REPAIR  1998   epigastric     Family History  Problem Relation Age of Onset  . Hypertension Mother   . Prostate cancer Brother        surgery/cured  . Lung cancer Brother        new primary/same brother     Social History    Socioeconomic History  . Marital status: Married    Spouse name: Alison Murray  . Number of children: 4  . Years of education: College  . Highest education level: Not on file  Occupational History  . Occupation:       Employer: [employer]    Comment: Retired  Scientific laboratory technician  . Financial resource strain: Not on file  . Food insecurity    Worry:  Not on file    Inability: Not on file  . Transportation needs    Medical: Not on file    Non-medical: Not on file  Tobacco Use  . Smoking status: Former Smoker    Packs/day: 0.50    Years: 62.00    Pack years: 31.00    Types: Cigarettes  . Smokeless tobacco: Never Used  . Tobacco comment: smoke one or two per day  Substance and Sexual Activity  . Alcohol use: No    Alcohol/week: 1.0 standard drinks    Types: 1 Cans of beer per week  . Drug use: No    Comment: quit smoking 02/2012  . Sexual activity: Not on file  Lifestyle  . Physical activity    Days per week: Not on file    Minutes per session: Not on file  . Stress: Not on file  Relationships  . Social Herbalist on phone: Not on file    Gets together: Not on file    Attends religious service: Not on file    Active member of club or organization: Not on file    Attends meetings of clubs or organizations: Not on file    Relationship status: Not on file  . Intimate partner violence    Fear of current or ex partner: Not on file    Emotionally abused: Not on file    Physically abused: Not on file    Forced sexual activity: Not on file  Other Topics Concern  . Not on file  Social History Narrative   Patient is Married Designer, television/film set), and lives at home with his wife 53 years   Patient has 3 daughters and 1 son.   Patient has a college education.   Patient is right-handed.   Patient drinks one cup of coffee, 1-2 cups of tea daily and rarely drinks soda.   Retired from [employer]     BP 118/80   Pulse 81   Ht 6\' 3"  (1.905 m)   Wt 178 lb (80.7 kg)   SpO2 98%   BMI  22.25 kg/m   Physical Exam:  Well appearing NAD HEENT: Unremarkable Neck:  No JVD, no thyromegally Lymphatics:  No adenopathy Back:  No CVA tenderness Lungs:  Clear with no wheezes HEART:  Regular rate rhythm, no murmurs, no rubs, no clicks Abd:  soft, positive bowel sounds, no organomegally, no rebound, no guarding Ext:  2 plus pulses, no edema, no cyanosis, no clubbing Skin:  No rashes no nodules Neuro:  CN II through XII intact, motor grossly intact  EKG - nsr with atrial pacing and RBBB  DEVICE  Normal device function.  See PaceArt for details.   Assess/Plan: 1. Symptomatic sinus node dysfunction - he is asymptomatic, s/p PPM insertion. 2. PPM - his Medtronic DDD PM is working normally. We will recheck in several months. 3. Sjogrens - from my perspective, he would be ok to start plaquenil. I would ask that if he starts this he will need to come in for a 12 lead ECG approx 2 weeks after initiation. 4. SVT - he has had no symptoms but has episodes lasting seconds at rates of 150-160. He will undergo watchful waiting.  Derek Carr.D.

## 2018-11-28 ENCOUNTER — Ambulatory Visit: Payer: Medicare HMO | Admitting: Orthotics

## 2018-11-28 ENCOUNTER — Other Ambulatory Visit: Payer: Self-pay

## 2018-11-28 DIAGNOSIS — Z9181 History of falling: Secondary | ICD-10-CM

## 2018-11-28 DIAGNOSIS — G609 Hereditary and idiopathic neuropathy, unspecified: Secondary | ICD-10-CM

## 2018-11-28 DIAGNOSIS — R2681 Unsteadiness on feet: Secondary | ICD-10-CM

## 2018-11-28 NOTE — Progress Notes (Signed)
Patient came in today for Eval/assessment for [place].  Patient presents with a history of falling, and also fearful of falling.  Balance tests performed and patient does have issues keeping balance especially in foot to heel test.  Patient has week dorsiflexors and well as inverters/everters muscle group.  Demonstrants ankle instability.   Significant Idiopathic neuropathy and significant < proprioception

## 2018-11-29 DIAGNOSIS — N401 Enlarged prostate with lower urinary tract symptoms: Secondary | ICD-10-CM | POA: Diagnosis not present

## 2018-11-29 DIAGNOSIS — N3941 Urge incontinence: Secondary | ICD-10-CM | POA: Diagnosis not present

## 2018-11-29 DIAGNOSIS — Z8546 Personal history of malignant neoplasm of prostate: Secondary | ICD-10-CM | POA: Diagnosis not present

## 2018-12-04 ENCOUNTER — Telehealth: Payer: Self-pay | Admitting: Neurology

## 2018-12-04 NOTE — Telephone Encounter (Signed)
Patient's daughter called and said the patient's symptoms of dizziness, imbalance, and visual disturbances was manageable until a couple of days ago. Now, these symptoms have gotten worse and they are not manageable. She'd like to speak with a nurse more about this.

## 2018-12-04 NOTE — Telephone Encounter (Signed)
Symptoms were stable and manageable for 4 months  2 changes- Recently dx with Sjogren's. Started on Prednisone instead of Plaquenil.  After starting Prednisone vision changes, dizziness and balance problems started. Daughter then cut back on dosage then symptoms got better and went away.   Started with (3) 1mg  tab qd- did for 2 days then went to 1mg  for 2 weeks. Then stopped this past Friday  Friday vision, balance problems came back.  Pt has been off of the Prednisone since Saturday.   Lubricant eye drops may be causing? Drops are viscous (Systane)  Sleep is fine. Has some nightmares randomly.   Speech is distorted some. Just not as clear  Continues to take Sinemet and Aricept.]  Suggestions? Would like pt to be seen.

## 2018-12-05 ENCOUNTER — Telehealth: Payer: Self-pay | Admitting: Neurology

## 2018-12-05 ENCOUNTER — Ambulatory Visit: Payer: Self-pay | Admitting: Adult Health

## 2018-12-05 ENCOUNTER — Other Ambulatory Visit: Payer: Self-pay

## 2018-12-05 DIAGNOSIS — F0390 Unspecified dementia without behavioral disturbance: Secondary | ICD-10-CM

## 2018-12-05 NOTE — Telephone Encounter (Signed)
Left message informing daughter. Order for UA, CBC, CMP placed and faxed to [facility]/ Dr. Joylene Draft [phone]

## 2018-12-05 NOTE — Telephone Encounter (Signed)
Labs faxed to [facility]

## 2018-12-05 NOTE — Telephone Encounter (Signed)
Any other new medications started? For sudden changes like this, it is usually from new medication/interaction, infection (would check CBC, CMP, urinalysis if not recently done), poor sleep. Can put on wait list for now, but currently booked out. Thanks

## 2018-12-05 NOTE — Telephone Encounter (Signed)
If symptoms started after the UA, would do UA. Thanks

## 2018-12-05 NOTE — Telephone Encounter (Signed)
Patient's daughter called regarding her dad and him having lab work at his PCP office Dr. Joylene Draft. They are just needing Dr. Delice Lesch to fax an Order to them with Lab orders. The fax number is [phone]. Thanks

## 2018-12-05 NOTE — Telephone Encounter (Signed)
Daughter states that urine was checked by Dr. Alinda Money with [facility] last week. I will get the results but daughter states that they were normal. Do you still want the UA ordered?

## 2018-12-06 ENCOUNTER — Telehealth: Payer: Self-pay

## 2018-12-06 DIAGNOSIS — I10 Essential (primary) hypertension: Secondary | ICD-10-CM | POA: Diagnosis not present

## 2018-12-06 NOTE — Telephone Encounter (Signed)
Plaquenil vs Prednisone for Sjogren's? Should they f/u with the Dr. Parks Ranger prescribed the prednisone? How does Dr. Delice Lesch feel about stopping Sinemet and trying Plaquenil?

## 2018-12-07 DIAGNOSIS — R82998 Other abnormal findings in urine: Secondary | ICD-10-CM | POA: Diagnosis not present

## 2018-12-08 DIAGNOSIS — D649 Anemia, unspecified: Secondary | ICD-10-CM | POA: Diagnosis not present

## 2018-12-11 ENCOUNTER — Telehealth: Payer: Self-pay

## 2018-12-11 NOTE — Telephone Encounter (Signed)
Left message informing daughter of Dr. Amparo Bristol recommendations. Instructed to call back with concerns if needed.

## 2018-12-11 NOTE — Telephone Encounter (Signed)
Pls let daughter know that it is best to discuss Plaquenil with the Rheumatologist, this would not be within my scope of practice unfortunately. Sinemet is prescribed for the slowed movements and tremors, would stay on it. Thanks

## 2018-12-11 NOTE — Telephone Encounter (Signed)
Dr. Delice Lesch,  The pts daughter has some questions about Plaquenil vs Prednisone. Pt was recently diagnosed with Sjogrens. Please see previous telephone note. Thank you.

## 2018-12-12 ENCOUNTER — Other Ambulatory Visit: Payer: Self-pay

## 2018-12-12 ENCOUNTER — Telehealth: Payer: Self-pay | Admitting: Neurology

## 2018-12-12 DIAGNOSIS — R42 Dizziness and giddiness: Secondary | ICD-10-CM

## 2018-12-12 DIAGNOSIS — H539 Unspecified visual disturbance: Secondary | ICD-10-CM

## 2018-12-12 DIAGNOSIS — R269 Unspecified abnormalities of gait and mobility: Secondary | ICD-10-CM

## 2018-12-12 NOTE — Telephone Encounter (Signed)
I don't have the results of tests done. If normal, CT head without contrast can be done for Dx: dizziness, gait instability, vision changes. Thanks

## 2018-12-12 NOTE — Telephone Encounter (Signed)
Also wondering if CAT Scan would show anything and explain dizziness. Thanks!

## 2018-12-12 NOTE — Telephone Encounter (Signed)
Daughter is calling in about the results of blood and urine lab work. Update nurse on continued symptoms. Thanks!

## 2018-12-12 NOTE — Telephone Encounter (Signed)
Informed of UA and blood test results. CT Head scheduled.

## 2018-12-27 ENCOUNTER — Telehealth: Payer: Self-pay | Admitting: Internal Medicine

## 2018-12-27 DIAGNOSIS — H35013 Changes in retinal vascular appearance, bilateral: Secondary | ICD-10-CM | POA: Diagnosis not present

## 2018-12-27 DIAGNOSIS — H35033 Hypertensive retinopathy, bilateral: Secondary | ICD-10-CM | POA: Diagnosis not present

## 2018-12-27 DIAGNOSIS — H401132 Primary open-angle glaucoma, bilateral, moderate stage: Secondary | ICD-10-CM | POA: Diagnosis not present

## 2018-12-27 DIAGNOSIS — H35373 Puckering of macula, bilateral: Secondary | ICD-10-CM | POA: Diagnosis not present

## 2018-12-27 NOTE — Telephone Encounter (Signed)
Spoke with Derek Carr. Pt is no longer near the monitor so will plan to schedule automatic transmission tonight. Advised that after transmission is received, we will forward information to Dr. Lovena Le for advisement if any arrhythmias. [name] is agreeable with plan. ED precautions reviewed for syncope or worsening symptoms.  Clarified that pt's dizziness occurs only when walking, sometimes associated with his visual disturbances. Pt's daughter is planning to clarify with Dr. Gerilyn Nestle whether he could stop donepezil and start Plaquenil. Explained Dr. Tanna Furry notes should be visible to [facility] providers via [place]. No further questions at this time.

## 2018-12-27 NOTE — Telephone Encounter (Signed)
New message:    Patient daughter calling concering patient pace maker. Patient is having some trouble. Please call patient daughter. Patient has been dizzy and blurred vision.

## 2018-12-27 NOTE — Telephone Encounter (Signed)
Returned call to [name] [facility]). She reports pt has a history of chronic dizziness and blurred vision. They have been told that his blurred vision is from Sjrogen's syndrome. Symptoms have been gradually worsening since 10/2018. Reports Dr. Gerilyn Nestle, pt's rheumatologist at [facility] was planning to start Plaquenil, but ultimately cannot due to interaction with donepezil (per note in [facility]). Also working with neurology/Dr. Delice Lesch, scheduled for head CT on 12/29/18. Pt's daughter concerned that he is now symptomatic with his SVT episodes. Advised we will need a manual PPM transmission for review. Pt and [name] are currently at another MD visit, she requests that we call back around noon to assist with transmission. No further questions at this time.

## 2018-12-27 NOTE — Telephone Encounter (Signed)
Patient returning call.

## 2018-12-28 ENCOUNTER — Other Ambulatory Visit: Payer: Self-pay | Admitting: Internal Medicine

## 2018-12-28 ENCOUNTER — Ambulatory Visit (INDEPENDENT_AMBULATORY_CARE_PROVIDER_SITE_OTHER): Payer: Medicare HMO | Admitting: Orthotics

## 2018-12-28 ENCOUNTER — Other Ambulatory Visit: Payer: Self-pay

## 2018-12-28 DIAGNOSIS — R2681 Unsteadiness on feet: Secondary | ICD-10-CM | POA: Diagnosis not present

## 2018-12-28 DIAGNOSIS — R519 Headache, unspecified: Secondary | ICD-10-CM

## 2018-12-28 DIAGNOSIS — Z9181 History of falling: Secondary | ICD-10-CM

## 2018-12-28 NOTE — Telephone Encounter (Signed)
Transmission received, no episodes. Normal device function   Chanetta Marshall, NP 12/28/2018 6:26 AM

## 2018-12-29 ENCOUNTER — Ambulatory Visit
Admission: RE | Admit: 2018-12-29 | Discharge: 2018-12-29 | Disposition: A | Discharge status: Home / Self Care | Payer: Medicare HMO | Source: Ambulatory Visit | Attending: Neurology | Admitting: Neurology

## 2018-12-29 ENCOUNTER — Ambulatory Visit
Admission: RE | Admit: 2018-12-29 | Discharge: 2018-12-29 | Disposition: A | Discharge status: Home / Self Care | Payer: Medicare HMO | Source: Ambulatory Visit | Attending: Internal Medicine | Admitting: Internal Medicine

## 2018-12-29 ENCOUNTER — Telehealth: Payer: Self-pay | Admitting: Podiatry

## 2018-12-29 DIAGNOSIS — R296 Repeated falls: Secondary | ICD-10-CM | POA: Diagnosis not present

## 2018-12-29 DIAGNOSIS — G609 Hereditary and idiopathic neuropathy, unspecified: Secondary | ICD-10-CM | POA: Diagnosis not present

## 2018-12-29 DIAGNOSIS — Z23 Encounter for immunization: Secondary | ICD-10-CM | POA: Diagnosis not present

## 2018-12-29 DIAGNOSIS — R269 Unspecified abnormalities of gait and mobility: Secondary | ICD-10-CM

## 2018-12-29 DIAGNOSIS — S0993XA Unspecified injury of face, initial encounter: Secondary | ICD-10-CM | POA: Diagnosis not present

## 2018-12-29 DIAGNOSIS — R519 Headache, unspecified: Secondary | ICD-10-CM | POA: Diagnosis not present

## 2018-12-29 DIAGNOSIS — G259 Extrapyramidal and movement disorder, unspecified: Secondary | ICD-10-CM | POA: Diagnosis not present

## 2018-12-29 DIAGNOSIS — R42 Dizziness and giddiness: Secondary | ICD-10-CM | POA: Diagnosis not present

## 2018-12-29 DIAGNOSIS — N183 Chronic kidney disease, stage 3 unspecified: Secondary | ICD-10-CM | POA: Diagnosis not present

## 2018-12-29 DIAGNOSIS — H539 Unspecified visual disturbance: Secondary | ICD-10-CM

## 2018-12-29 DIAGNOSIS — G20 Parkinson's disease: Secondary | ICD-10-CM | POA: Diagnosis not present

## 2018-12-29 DIAGNOSIS — E785 Hyperlipidemia, unspecified: Secondary | ICD-10-CM | POA: Diagnosis not present

## 2018-12-29 DIAGNOSIS — G4730 Sleep apnea, unspecified: Secondary | ICD-10-CM | POA: Diagnosis not present

## 2018-12-29 NOTE — Telephone Encounter (Signed)
pts daughter left message asking about the balance braces they picked up yesterday. Is there a left and right or which way does the Velcro go towards the inside of leg or towards the outside of leg.

## 2018-12-29 NOTE — Progress Notes (Signed)
Patient came in today to pick up Derek Carr (Bilateral).   Patient was able to don Carr independently and Carr was check for fit to custom.   Patient was observed walking with Carr and gait was improved as well as stability.   Patient was advised of care and wearing instructions.  Advised to notify practice if there were any issues; especially skin irritation.  

## 2019-01-01 ENCOUNTER — Telehealth: Payer: Self-pay | Admitting: Neurology

## 2019-01-01 NOTE — Telephone Encounter (Signed)
1. Patient's daughter called requesting a call back from a nurse with CT scan results.  2. Patient's daughter wants to speak with the nurse about the next office visit scheduled on 01/10/19 with Dr. Delice Lesch.

## 2019-01-02 ENCOUNTER — Ambulatory Visit: Payer: Medicare HMO | Admitting: Family Medicine

## 2019-01-02 ENCOUNTER — Other Ambulatory Visit: Payer: Medicare HMO

## 2019-01-02 NOTE — Telephone Encounter (Signed)
Spoke with patients daughter and questions answered. Family trusts Dr Delice Lesch, needs her assurance and support.  Packet of community resources mailed.

## 2019-01-03 ENCOUNTER — Telehealth: Payer: Self-pay

## 2019-01-03 NOTE — Telephone Encounter (Signed)
Appears per notes in Care Everywhere that pt was started on Plaquenil. Per Dr. Tanna Furry OV note from 11/23/18, he will need an EKG approximately 2 weeks after initiation.

## 2019-01-03 NOTE — Telephone Encounter (Signed)
Patient daughter wanted to have appointment move sooner but nothing available.  Told her to call back daily for cancellations.

## 2019-01-09 ENCOUNTER — Other Ambulatory Visit: Payer: Self-pay

## 2019-01-09 ENCOUNTER — Encounter: Payer: Self-pay | Admitting: Neurology

## 2019-01-09 ENCOUNTER — Encounter

## 2019-01-09 ENCOUNTER — Ambulatory Visit (INDEPENDENT_AMBULATORY_CARE_PROVIDER_SITE_OTHER): Payer: Medicare HMO | Admitting: Neurology

## 2019-01-09 VITALS — BP 145/90 | HR 74 | Temp 95.0°F | Ht 75.0 in | Wt 175.0 lb

## 2019-01-09 DIAGNOSIS — G20C Parkinsonism, unspecified: Secondary | ICD-10-CM

## 2019-01-09 DIAGNOSIS — R42 Dizziness and giddiness: Secondary | ICD-10-CM | POA: Diagnosis not present

## 2019-01-09 DIAGNOSIS — G20 Parkinson's disease: Secondary | ICD-10-CM | POA: Diagnosis not present

## 2019-01-09 DIAGNOSIS — G629 Polyneuropathy, unspecified: Secondary | ICD-10-CM | POA: Diagnosis not present

## 2019-01-09 DIAGNOSIS — F0390 Unspecified dementia without behavioral disturbance: Secondary | ICD-10-CM

## 2019-01-09 MED ORDER — MECLIZINE HCL 12.5 MG PO TABS: ORAL_TABLET | ORAL | 11 refills | Status: DC

## 2019-01-09 MED ORDER — CARBIDOPA-LEVODOPA 25-100 MG PO TABS: ORAL_TABLET | ORAL | 3 refills | Status: DC

## 2019-01-09 NOTE — Progress Notes (Signed)
NEUROLOGY FOLLOW UP OFFICE NOTE  Derek Carr:[id number] 05-Mar-1931  HISTORY OF PRESENT ILLNESS: I had the pleasure of seeing Derek Carr in follow-up in the neurology clinic on 01/09/2019.  The patient was last seen 5 months ago. He is again accompanied by his daughter Derek Carr who provides additional history today.  Records and images were personally reviewed where available.  He had presented for evaluation of slowed speech and movements, weakness. Etiology of symptoms unclear, he has a neurodegenerative disorder with fluctuating cognition, bradykinesia, tremor. We had previously discussed Dementia with Lewy Bodies. He does not have any hallucinations. He appears to have had a significant response to low dose Sinemet, daughter has not noticed much tremor now, he had been doing well with no episodes of fluctuating speech symptoms. Her main concern had been his balance/gait, EMG/NCV confirmed severe neuropathy. Neuropathy labs (B1, B12, TSH, copper, SPEP, IFE, folate, heavy metals screen) were negative. He has been seeing Rheumatology and recently been diagnosed with Sjogren's syndrome last 09/2018. Derek Carr called our office last month to report that his symptoms had been stable and manageable until he started Prednisone and started reporting vision changes, dizziness, and more balance problems. She reduced dose and the symptoms got better and resolved, however he again started complaining of vision changes and balance problems. His speech is not as clear and sounded a little distorted. He had a head CT without contrast which I personally reviewed, no acute changes, similar to prior scans. Derek Carr was also concerned about rigidity when the flu shot was being administered and his left deltoid muscle was so tight they could not do the the injection.    He has been complaining of blurred vision to [place]. Today he states that 30 minutes after using his eye drops, his vision gets blurred then improves within an  hour. He has dry eye due to Sjogrens. He reports the dizziness is mostly when he lies down and stands up, it is temporary and goes away, describing it as a "brief wavering." Derek Carr however states he complains of dizziness all day throughout the day when walking. He denies this today. He tells her dizziness currently is "just about a zero." He ambulates with a walker, no falls. He does not know how to balance himself and feels himself waver. Derek Carr reports that she had given him her meclizine and this seemed to help, he was rating it as a 10 and it went down to a 3. He is on Donepezil and reports vivid nightmares. No hallucinations. He is on a very low dose of Sinemet, Robin only gives 1 tablet daily, no side effects.    History on Initial Assessment 04/06/2018: This is a pleasant 85 year old right-handed man with a history of hypertension, hyperlipidemia, prostate cancer, orthostatic hypotension, sleep apnea, TIAs, neuropathy, presenting for evaluation of the above symptoms. He has seen several neurologists in the past, records from Dr. Brett Fairy, Dr. Leonie Man, and Dr. Manuella Ghazi were reviewed. He was initially evaluated at [facility] in 2014 for gait imbalance and dizziness. He was found to have orthostatic hypotension. In 2015, he had episodes of extreme sleepiness, lethargy, limited responsiveness. His BP would be low. He had a sleep study showing central apnea. He had an MMSE of 21/30 in December 2015 and was started on Donepezil. He was seen by Dr. Manuella Ghazi in 2018 for gait and balance issues. MRI brain no acute changes. MRI L-spine showed advanced lumbar spondylosis with multilevel disc disease and facet disease. MRI C-spine showed degenerative  change without canal stenosis. EMG/NCV done 08/2016 was abnormal with a generalized sensorimotor polyneuropathy, cannot rule out superimposed bialteral lower lumbosacral polyradiculopathy. Gait disturbance likely multifactorial from lumbar disc disease and neuropathy, there were no signs and  symptoms of Parkinson's at that time. He was in the ER in 11/2016 for an episode of slow speech, he became diaphoretic while eating and was slower to answer questions, leaning to the left, no clear focal weakness. The episode lasted 10 minutes. Exam normal. He was back in the hospital on 03/01/17 for transient right-sided weakness, facial droop, dysarthria, confusion. When EMS arrived within 10 minutes, all the symptoms pretty much resolved. MRI brain no acute changes, there was mild chronic microvascular disease. CTA head showed fibrofatty plaque of the distal right cavernous segment with moderate to severe 70-80% stenosis, carotid US negative. He was evaluated by neurologist Dr. Leonie Man and continued on aspirin. EEG ordered which was normal. He had an MMSE of 18/30 in May 2019. He had another event on 09/14/17 where he started having delayed verbal responses, turned gray, and became unconscious. Vital signs stable, BS 108. He was orthostatic on standing with systolic BP in the [id number], bradycardic, and had a pacemaker implanted. He was also noted to be hypoglycemic at 57. MRI brain and EEG were unremarkable. On his visit with [facility] in 10/2017, Derek Carr reported recurrent transient episodes occurring around once a week where he would have slurred speech, weak voice, generalized muscle slowing, and worsening of gait. MMSE 17/30. His last visit at [facility] was in January 2020 where Derek Carr reported worsening visual disturbance and bilateral foot numbness up to his knees. Derek Carr continued to report recurrent transient spells and was started on Topamax 50mg  daily for empiric treatment of seizures vs complicated migraines. He denies any headaches. Repeat MRI brain 01/2018 no acute changes. She has not noticed any change in symptoms with the Topamax, she states initially the symptoms were episodic but now "become a part of him."   Derek Carr states that the one thing consistent over time has been his balance and gait issues. She started noticing  speech changes a year ago, there was a change in the quality, speed, volume, and strength of voice/speech. This is pretty consistent, but a little worse sometimes. She states his speech is never clear, just slow and distorted, hard to make out/not distinct. He has had problems with slowed motor movements, generally with his right arm when he tries to feed himself and misses sometimes. He has had a lot of coughing with solids and liquids since 2018 and saw ENT, diagnosed with "throat weakness on the right" with swallow evaluation. He had swallowing therapy which helped, things are much better with occasional coughing on swallowing. He has an unusual amount of fatigue, he can only go 500 feet then get tired even after 3 years of PT. She states "5 years ago he could jet set." He has excessive sleeping despite adjustment in CPAP settings. He states he is just relaxing. His daughter reports he yells out in his sleep but no clear REM behavior disorder. He started having vision changes over the past year, sometimes blurry, sometimes seeing spots in his vision. Sometimes he says he can't see, then later sight is fine. No visual hallucinations. He saw his optometrist with a normal exam. His daughter notes drooping eyelids. He has had difficulty writing over the past year, she is unsure if it is due to his vision or motor skills. He continues to have numbness in his feet  and cannot tell where his feet are placed. He thinks his memory is pretty good, a little problematic sometimes. He does not think as quick as he used to. He lives with his wife, Derek Carr moved in a month ago and manages his medications. Another daughter manages finances. He does not drive much, he states he drove yesterday and denies getting lost. He is independent with dressing and bathing. No side effects on Donepezil. He has 2 sisters with dementia, a cousin with Parkinson's disease. No history of significant head injuries, no alcohol use.   PAST MEDICAL  HISTORY: Past Medical History:  Diagnosis Date  . AAA (abdominal aortic aneurysm) ([place])   . Anemia   . Ankle fracture    left  . Aortic regurgitation   . Atherosclerosis   . Complication of anesthesia    CONFUSION - Pt's family very concerned about this  . DDD (degenerative disc disease)   . Degenerative arthritis   . Dermatitis, atopic ARMS AND LEGS  . Diverticulosis   . Erectile dysfunction   . Frequency of urination   . Glaucoma   . History of asbestos exposure   . History of radiation therapy 8/19-13-10/18/11   prostate, 33 GY  . History of shingles 2012-- BILATERAL EYES--  NO RESIDUAL  . Hyperlipidemia   . Hypertension   . Inguinal hernia   . Lumbar scoliosis   . Nocturia   . OA (osteoarthritis) of hip RIGHT  . Prostate cancer ([place]) 05/11/2011   bx=Adenocarcinoma,gleason3+4=7, 4+4=8,PSA=10.30volume=45.7cc  . Renal cyst    bilateral  . Smokers' cough ([place])   . Stroke ([place])   . Thyroid cyst     MEDICATIONS: Current Outpatient Medications on File Prior to Visit  Medication Sig Dispense Refill  . acetaminophen (TYLENOL) 500 MG tablet Take 1,000 mg by mouth 2 (two) times daily.     . Ascorbic Acid (VITAMIN C) 1000 MG tablet Take 1,000 mg by mouth daily.    Marland Kitchen aspirin EC 81 MG tablet Take 81 mg by mouth daily.    Marland Kitchen b complex vitamins tablet Take 1 tablet by mouth daily.    . Calcium Carbonate Antacid (TUMS PO) Take 1 tablet by mouth daily.    . Cholecalciferol (VITAMIN D-3) 125 MCG (5000 UT) TABS Take 1 capsule by mouth 2 (two) times daily.     . clobetasol cream (TEMOVATE) [id number] % Apply 1 application topically daily as needed.     . donepezil (ARICEPT) 10 MG tablet Take 1 tablet (10 mg total) by mouth at bedtime. 90 tablet 1  . folic acid (FOLVITE) 1 MG tablet Take 1 mg by mouth daily.    Marland Kitchen lidocaine-prilocaine (EMLA) cream Apply 1 application topically as needed. 30 g 1  . NONFORMULARY OR COMPOUNDED ITEM [facility] Neuropathy Cream 120 grams 3 refills  Sent in  07-04-18    . Omega 3 1000 MG CAPS Take 1 tablet by mouth daily.    . Probiotic Product (PROBIOTIC PO) Take 1 capsule by mouth daily.     Marland Kitchen Ubiquinol (QUNOL COQ10/UBIQUINOL/MEGA) 100 MG CAPS Take 1 tablet by mouth at bedtime.     Marland Kitchen umeclidinium-vilanterol (ANORO ELLIPTA) 62.5-25 MCG/INH AEPB Only open the device one time and then take your two separate drags to be sure you get it all 1 each 11  . UNABLE TO FIND Med Name: CPAP per Dr Beacher May    . valACYclovir (VALTREX) 1000 MG tablet Take 1,000 mg by mouth daily.    Marland Kitchen zinc gluconate  50 MG tablet Take 50 mg by mouth daily.     No current facility-administered medications on file prior to visit.    ALLERGIES: Allergies  Allergen Reactions  . Oxycodone Other (See Comments)  . Dilaudid [Hydromorphone Hcl] Other (See Comments)    Confusion   . Methocarbamol Other (See Comments)    Drowsiness   . Percodan [Oxycodone-Aspirin] Other (See Comments)    Confusion   . Tramadol Other (See Comments)    Drowsiness   . Vicodin [Hydrocodone-Acetaminophen] Other (See Comments)    Confusion     FAMILY HISTORY: Family History  Problem Relation Age of Onset  . Hypertension Mother   . Prostate cancer Brother        surgery/cured  . Lung cancer Brother        new primary/same brother    SOCIAL HISTORY: Social History   Socioeconomic History  . Marital status: Married    Spouse name: Alison Murray  . Number of children: 4  . Years of education: College  . Highest education level: Not on file  Occupational History  . Occupation:       Employer: [employer]    Comment: Retired  Tobacco Use  . Smoking status: Former Smoker    Packs/day: 0.50    Years: 62.00    Pack years: 31.00    Types: Cigarettes  . Smokeless tobacco: Never Used  . Tobacco comment: smoke one or two per day  Substance and Sexual Activity  . Alcohol use: No    Alcohol/week: 1.0 standard drinks    Types: 1 Cans of beer per week  . Drug use: No    Comment: quit  smoking 02/2012  . Sexual activity: Not Currently    Partners: Female  Other Topics Concern  . Not on file  Social History Narrative   Patient is Married Designer, television/film set), and lives at home with his wife 83 years   Patient has 3 daughters and 1 son.   Patient has a college education.   Patient is right-handed.   Patient drinks one cup of coffee, 1-2 cups of tea daily and rarely drinks soda.   Retired from [employer] Strain:   . Difficulty of Paying Living Expenses: Not on file  Food Insecurity:   . Worried About Charity fundraiser in the Last Year: Not on file  . Ran Out of Food in the Last Year: Not on file  Transportation Needs:   . Lack of Transportation (Medical): Not on file  . Lack of Transportation (Non-Medical): Not on file  Physical Activity:   . Days of Exercise per Week: Not on file  . Minutes of Exercise per Session: Not on file  Stress:   . Feeling of Stress : Not on file  Social Connections:   . Frequency of Communication with Friends and Family: Not on file  . Frequency of Social Gatherings with Friends and Family: Not on file  . Attends Religious Services: Not on file  . Active Member of Clubs or Organizations: Not on file  . Attends Archivist Meetings: Not on file  . Marital Status: Not on file  Intimate Partner Violence:   . Fear of Current or Ex-Partner: Not on file  . Emotionally Abused: Not on file  . Physically Abused: Not on file  . Sexually Abused: Not on file    REVIEW OF SYSTEMS: Constitutional: No fevers, chills, or sweats, no generalized fatigue, change  in appetite Eyes: No visual changes, double vision, eye pain Ear, nose and throat: No hearing loss, ear pain, nasal congestion, sore throat Cardiovascular: No chest pain, palpitations Respiratory:  No shortness of breath at rest or with exertion, wheezes GastrointestinaI: No nausea, vomiting, diarrhea, abdominal pain, fecal  incontinence Genitourinary:  No dysuria, urinary retention or frequency Musculoskeletal:  No neck pain, back pain Integumentary: No rash, pruritus, skin lesions Neurological: as above Psychiatric: No depression, insomnia, anxiety Endocrine: No palpitations, fatigue, diaphoresis, mood swings, change in appetite, change in weight, increased thirst Hematologic/Lymphatic:  No anemia, purpura, petechiae. Allergic/Immunologic: no itchy/runny eyes, nasal congestion, recent allergic reactions, rashes  PHYSICAL EXAM: Vitals:   01/09/19 1248  BP: (!) 145/90  Pulse: 74  Temp: (!) 95 F (35 C)  SpO2: 98%   General: No acute distress Skin/Extremities: No rash, no edema Neurological Exam: alert and oriented to person, place, and time. No aphasia or dysarthria. Fund of knowledge is appropriate.  Recent and remote memory are impaired.  Attention and concentration are normal. Cranial nerves: Pupils equal, round. Extraocular movements intact with no nystagmus. Visual fields full. No facial asymmetry. Motor: Bulk and tone normal,no cogwheeling, no rigidity. Muscle strength 5/5 throughout with no pronator drift.  Deep tendon reflexes unable to elicit. Finger to nose testing intact.  Gait slow and cautious with walker, good strides. Good finger and foot taps.   IMPRESSION: This is a pleasant 85 yo RH man with a history of hypertension, hyperlipidemia, prostate cancer, orthostatic hypotension, sleep apnea, TIAs, neuropathy, who presented for evaluation of slowed speech and movements, weakness. Etiology of symptoms unclear, he has a neurodegenerative disorder with fluctuating cognition, bradykinesia, tremor. We had previously discussed Dementia with Lewy Bodies. He does not have any hallucinations. He appears to have had a significant response to low dose Sinemet, he has not had significant episodes of fluctuating speech symptoms. Derek Carr had several concerns, including vision changes, dizziness, and "rigidity."  There is no rigidity today, we discussed what rigidity in the context of parkinsonism means, and that it is not the deltoid muscle tightness she was worried about. Vision issues appear to be a side effect of his eye drops, he was encouraged to continue using it for dry eye. Dizziness appears positional, if symptoms worsen, vestibular therapy will be ordered. He had a good response to meclizine, may use sparingly. Derek Carr states his rheumatologist is concerned about interactions between Aricept and Plaquenil, we discussed stopping Aricept so he can start the Plaquenil. See if this also helps with the vivid nightmares. Continue close supervision, follow-up in 4 months, they know to call for any changes.   Thank you for allowing me to participate in his care.  Please do not hesitate to call for any questions or concerns.  The duration of this appointment visit was 40 minutes of face-to-face time with the patient.  Greater than 50% of this time was spent in counseling, explanation of diagnosis, planning of further management, and coordination of care.   Ellouise Newer, M.D.   CC: Dr. [name]

## 2019-01-09 NOTE — Patient Instructions (Addendum)
1. Try stopping the Aricept so you can start the Plaquenil. See how he feels with the nightmares  2. Continue low dose Sinemet  3. If dizziness worsens, we can do Vestibular therapy  4. Follow-up in 4 months, call for any changes

## 2019-01-10 ENCOUNTER — Ambulatory Visit: Payer: Medicare HMO | Admitting: Neurology

## 2019-01-12 ENCOUNTER — Ambulatory Visit: Payer: Medicare HMO | Admitting: Neurology

## 2019-01-15 NOTE — Telephone Encounter (Signed)
Outreach  Made to [employer].  Per [name] Pt has not started plaquenil yet.  Advised to call office when Pt starts as he will need EKG 2 weeks after starting.  [name] indicates understanding.

## 2019-01-23 ENCOUNTER — Telehealth: Payer: Self-pay | Admitting: Internal Medicine

## 2019-01-23 NOTE — Telephone Encounter (Signed)
New message:  Daughter [name] wanted to know when to do a follow-up EKG that needs to be done once the patient begins his plaquinel 200 mg. Pt has not started taking this medication yet. This medicine was prescribed by a different doctor, but Dr. Lovena Le and the pacemaker clinic are following the EKG changes. Please advise

## 2019-01-24 NOTE — Telephone Encounter (Signed)
Spoke with daughter and explained patient will need to come in for 12 lead EKG to measure QT interval 2 weeks after starting medication. Home remote monitoring will assist in recording any VT that may occur.

## 2019-01-24 NOTE — Telephone Encounter (Signed)
LMOM. Unable to monitor QT interval from remote pacemaker transmissions.

## 2019-01-24 NOTE — Telephone Encounter (Signed)
Returned call to Derek Carr.  Pt will start plaquenil today 01/24/2019.  This nurse scheduled nurse visit for 1/14 for EKG 2 weeks after starting medication (per GT)  Robin wants to know if device clinic is able to monitor Pt's QT via his pacemaker.  Will forward to device clinic to answer this question.

## 2019-01-29 ENCOUNTER — Ambulatory Visit: Payer: Medicare HMO | Admitting: Neurology

## 2019-01-31 ENCOUNTER — Encounter: Payer: Self-pay | Admitting: Neurology

## 2019-02-01 LAB — CUP PACEART INCLINIC DEVICE CHECK
Date Time Interrogation Session: 20201029114434
Implantable Lead Implant Date: 20190823
Implantable Lead Implant Date: 20190823
Implantable Lead Location: 753859
Implantable Lead Location: 753860
Implantable Lead Model: 5076
Implantable Lead Model: 5076
Implantable Pulse Generator Implant Date: 20190823

## 2019-02-05 ENCOUNTER — Ambulatory Visit (INDEPENDENT_AMBULATORY_CARE_PROVIDER_SITE_OTHER): Payer: Medicare HMO | Admitting: *Deleted

## 2019-02-05 DIAGNOSIS — I495 Sick sinus syndrome: Secondary | ICD-10-CM

## 2019-02-05 LAB — CUP PACEART REMOTE DEVICE CHECK
Battery Remaining Longevity: 153 mo
Battery Voltage: 3.04 V
Brady Statistic AP VP Percent: 0.02 %
Brady Statistic AP VS Percent: 69.87 %
Brady Statistic AS VP Percent: 0.02 %
Brady Statistic AS VS Percent: 30.08 %
Brady Statistic RA Percent Paced: 69.64 %
Brady Statistic RV Percent Paced: 0.04 %
Date Time Interrogation Session: 20210110220405
Implantable Lead Implant Date: 20190823
Implantable Lead Implant Date: 20190823
Implantable Lead Location: 753859
Implantable Lead Location: 753860
Implantable Lead Model: 5076
Implantable Lead Model: 5076
Implantable Pulse Generator Implant Date: 20190823
Lead Channel Impedance Value: 285 Ohm
Lead Channel Impedance Value: 342 Ohm
Lead Channel Impedance Value: 361 Ohm
Lead Channel Impedance Value: 437 Ohm
Lead Channel Pacing Threshold Amplitude: 0.625 V
Lead Channel Pacing Threshold Amplitude: 1 V
Lead Channel Pacing Threshold Pulse Width: 0.4 ms
Lead Channel Pacing Threshold Pulse Width: 0.4 ms
Lead Channel Sensing Intrinsic Amplitude: 1.5 mV
Lead Channel Sensing Intrinsic Amplitude: 1.5 mV
Lead Channel Sensing Intrinsic Amplitude: 9.25 mV
Lead Channel Sensing Intrinsic Amplitude: 9.25 mV
Lead Channel Setting Pacing Amplitude: 1.5 V
Lead Channel Setting Pacing Amplitude: 2.5 V
Lead Channel Setting Pacing Pulse Width: 0.4 ms
Lead Channel Setting Sensing Sensitivity: 1.2 mV

## 2019-02-06 ENCOUNTER — Telehealth: Payer: Self-pay | Admitting: Internal Medicine

## 2019-02-06 NOTE — Telephone Encounter (Signed)
New Message   Patients daughter is calling because she would like to discuss the ekg that her father is to have done on 1/14. She also wants to know if he needs to have a EKG prior to starting a new medication plaquenel. He has not started the medication as of yet

## 2019-02-06 NOTE — Telephone Encounter (Signed)
Returned call to wife.  Advised EkG had been cancelled.  Asked her NOT to call to reschedule EKG until Pt is actually taking the medication.  Wife wants to know if he should have an EKG before the medication.  Advised NO he does not.  Just an EKG 2 weeks after actually taking the medication.  Wife indicates understanding.

## 2019-02-08 ENCOUNTER — Ambulatory Visit: Payer: Medicare HMO

## 2019-02-08 ENCOUNTER — Telehealth: Payer: Self-pay | Admitting: *Deleted

## 2019-02-08 NOTE — Telephone Encounter (Signed)
Discussed remote device check and recommendations w/both the pt and his daughter [name]. Pt's daughter had multiple questions in regards to the episodes that occurred svt and nsvt as well as the recommendation to start Metoprolol 25 mg BID. [name] is concerned as pt has a h/x of orthostatic hypotension and would really prefer to come in the office and discuss all of this with Dr. [name] before having the pt start any ne medications. She would like to discuss other options that may not affect the pt's bp as much as the metoprolol might. I assured pt and his daughter [name] that I will send my notes to Sherri, RN for Dr. [name] and she will call back after she discusses further with Dr. [name]. [name] thanked me for the call. I stated I will not send in Rx at this time. [name] thanked me for the help. Patient notified of result.  Please refer to phone note from today for complete details.   Julaine Hua, [facility] 02/08/2019 9:26 AM

## 2019-02-08 NOTE — Telephone Encounter (Signed)
-----   Message from Will Meredith Leeds, MD sent at 02/06/2019  9:06 AM EST ----- Abnormal device interrogation reviewed.  Lead parameters and battery status stable.  SVT noted, NSVT noted. Start metoprolol 25 mg BID.

## 2019-02-09 NOTE — Telephone Encounter (Signed)
Left detailed message informing dtr that per Dr. [name] we will not make any changes at this time and continue to monitor for now. Informed that I would follow up next week or she could send a mychart message reporting that she understood my message.

## 2019-02-11 ENCOUNTER — Other Ambulatory Visit: Payer: Self-pay | Admitting: Neurology

## 2019-02-12 NOTE — Telephone Encounter (Signed)
No message needed °

## 2019-02-12 NOTE — Telephone Encounter (Signed)
Dtr returns my call to thank me for calling & leaving detailed message about recommendation to monitor for now. She is asking several questions:  "Could his dizziness be r/t V tach?"  "Could we do an EKG to look at his QT interval?"  "Should we check a Mag level?" Made aware that pt only one episode of SVT & one episode of NSVT.  Informed that I don't think reported dizziness is related, but will have MD advise on. She reports that pt's dizziness just happens with no rhyme/reason", but they are improved compared to the past.  She states she isn't sure why it has improved b/c they have done nothing different.  She also reports that pt has not started the Plaquenil yet.  They are going in several weeks for a second opinion before deciding.  Dtr mentions that she spoke with Dr. Tanna Furry nurse about having an EKG prior to starting Plaquenil.  She wants nurse to know that her request for pt to have an EKG is r/t to NSVT episode and not Plaquenil.   Pt has not been seen by Dr. [name] since he implanted PPM in 2019.  (Camnitz was in hospital day pt needed device).  Pt has however seen Dr. Lovena Le, last was 10/2018.  Reviewed w/ Dr. [name] who recommended forwarding to Dr. Lovena Le to advise.  Also discussed w/ device clinic who will change PPM provider to Dr. Lovena Le so that transmissions will go to him going forward.  Dtr aware Dr. Tanna Furry nurse will call back with advisement.

## 2019-02-13 NOTE — Telephone Encounter (Signed)
Derek Carr will call to offer appt to Pt and daughter to discuss her many questions.

## 2019-02-14 DIAGNOSIS — H401132 Primary open-angle glaucoma, bilateral, moderate stage: Secondary | ICD-10-CM | POA: Diagnosis not present

## 2019-02-14 DIAGNOSIS — H04123 Dry eye syndrome of bilateral lacrimal glands: Secondary | ICD-10-CM | POA: Diagnosis not present

## 2019-02-15 ENCOUNTER — Encounter: Payer: Self-pay | Admitting: Internal Medicine

## 2019-02-15 ENCOUNTER — Other Ambulatory Visit: Payer: Self-pay

## 2019-02-15 ENCOUNTER — Ambulatory Visit: Payer: Medicare HMO | Admitting: Internal Medicine

## 2019-02-15 VITALS — BP 140/88 | HR 77 | Ht 75.0 in | Wt 174.0 lb

## 2019-02-15 DIAGNOSIS — I495 Sick sinus syndrome: Secondary | ICD-10-CM

## 2019-02-15 DIAGNOSIS — Z950 Presence of cardiac pacemaker: Secondary | ICD-10-CM

## 2019-02-15 DIAGNOSIS — I10 Essential (primary) hypertension: Secondary | ICD-10-CM | POA: Diagnosis not present

## 2019-02-15 NOTE — Patient Instructions (Signed)

## 2019-02-15 NOTE — Progress Notes (Signed)
HPI [name] returns today for followup. He is a pleasant 86 yo man with sinus node dysfunction, orthostasis, prior stroke, and HTN. In the interim, he has felt well. He denies chest pain or sob. He has not fallen recently. No COVID infection. He denies chest pain or sob.  Allergies  Allergen Reactions  . Oxycodone Other (See Comments)  . Dilaudid [Hydromorphone Hcl] Other (See Comments)    Confusion   . Methocarbamol Other (See Comments)    Drowsiness   . Percodan [Oxycodone-Aspirin] Other (See Comments)    Confusion   . Tramadol Other (See Comments)    Drowsiness   . Vicodin [Hydrocodone-Acetaminophen] Other (See Comments)    Confusion      Current Outpatient Medications  Medication Sig Dispense Refill  . acetaminophen (TYLENOL) 500 MG tablet Take 1,000 mg by mouth 2 (two) times daily.     . Ascorbic Acid (VITAMIN C) 1000 MG tablet Take 1,000 mg by mouth daily.    Marland Kitchen aspirin EC 81 MG tablet Take 81 mg by mouth daily.    Marland Kitchen b complex vitamins tablet Take 1 tablet by mouth daily.    . Calcium Carbonate Antacid (TUMS PO) Take 1 tablet by mouth daily.    . carbidopa-levodopa (SINEMET IR) 25-100 MG tablet Take 1 tablet daily 90 tablet 3  . Cholecalciferol (VITAMIN D-3) 125 MCG (5000 UT) TABS Take 1 capsule by mouth 2 (two) times daily.     . clobetasol cream (TEMOVATE) [id number] % Apply 1 application topically daily as needed.     . Cyanocobalamin 1000 MCG TBCR Take 1 tablet by mouth daily.    . folic acid (FOLVITE) 1 MG tablet Take 1 mg by mouth daily.    Marland Kitchen lidocaine-prilocaine (EMLA) cream Apply 1 application topically as needed. 30 g 1  . meclizine (ANTIVERT) 12.5 MG tablet Take 1 tablet as needed for dizziness 10 tablet 11  . NONFORMULARY OR COMPOUNDED ITEM [facility] Neuropathy Cream 120 grams 3 refills  Sent in 07-04-18    . Omega 3 1000 MG CAPS Take 1 tablet by mouth daily.    . Probiotic Product (PROBIOTIC PO) Take 1 capsule by mouth daily.     Marland Kitchen Ubiquinol  (QUNOL COQ10/UBIQUINOL/MEGA) 100 MG CAPS Take 1 tablet by mouth at bedtime.     Marland Kitchen umeclidinium-vilanterol (ANORO ELLIPTA) 62.5-25 MCG/INH AEPB Only open the device one time and then take your two separate drags to be sure you get it all 1 each 11  . UNABLE TO FIND Med Name: CPAP per Dr Beacher May    . valACYclovir (VALTREX) 1000 MG tablet Take 1,000 mg by mouth daily.    Marland Kitchen zinc gluconate 50 MG tablet Take 50 mg by mouth daily.     No current facility-administered medications for this visit.     Past Medical History:  Diagnosis Date  . AAA (abdominal aortic aneurysm) ([place])   . Anemia   . Ankle fracture    left  . Aortic regurgitation   . Atherosclerosis   . Complication of anesthesia    CONFUSION - Pt's family very concerned about this  . DDD (degenerative disc disease)   . Degenerative arthritis   . Dermatitis, atopic ARMS AND LEGS  . Diverticulosis   . Erectile dysfunction   . Frequency of urination   . Glaucoma   . History of asbestos exposure   . History of radiation therapy 8/19-13-10/18/11   prostate, 1 GY  . History of shingles  2012-- BILATERAL EYES--  NO RESIDUAL  . Hyperlipidemia   . Hypertension   . Inguinal hernia   . Lumbar scoliosis   . Nocturia   . OA (osteoarthritis) of hip RIGHT  . Prostate cancer ([place]) 05/11/2011   bx=Adenocarcinoma,gleason3+4=7, 4+4=8,PSA=10.30volume=45.7cc  . Renal cyst    bilateral  . Smokers' cough ([place])   . Stroke ([place])   . Thyroid cyst     ROS:   All systems reviewed and negative except as noted in the HPI.   Past Surgical History:  Procedure Laterality Date  . ATTEMPTED LEFT VATS/ LEFT THORACOTOMY/ RESECTION OF THE ENORMOUS, PROBABLE BRONCHOGENIC CYST  01-04-2005  DR Arlyce Dice  . CARDIAC CATHETERIZATION  02-24-2009  DR NASHER   MINOR CORONARY ARTERY IRREGULARITIES/ NORMAL LVSF/ EF 65-70%  . CATARACT EXTRACTION    . COLONOSCOPY W/ POLYPECTOMY    . CYSTOSCOPY  11/15/2011   Procedure: CYSTOSCOPY;  Surgeon: Dutch Gray, MD;   Location: [facility];  Service: Urology;  Laterality: N/A;  no seeds seen in bladder  . esophageus cyst removal  YRS AGO  . [place]  . ORIF ANKLE FRACTURE Left 05/03/2017  . ORIF ANKLE FRACTURE Left 05/03/2017   Procedure: OPEN REDUCTION INTERNAL FIXATION (ORIF) BIMALLEOLAR  ANKLE FRACTURE;  Surgeon: Wylene Simmer, MD;  Location: [facility];  Service: Orthopedics;  Laterality: Left;  . PACEMAKER IMPLANT N/A 09/16/2017   Procedure: PACEMAKER IMPLANT;  Surgeon: Constance Haw, MD;  Location: [facility];  Service: Cardiovascular;  Laterality: N/A;  . PROSTATE BIOPSY  05/11/11   Adenocarcinoma ([facility])  . RADIOACTIVE SEED IMPLANT  11/15/2011   Procedure: RADIOACTIVE SEED IMPLANT;  Surgeon: Dutch Gray, MD;  Location[facility];  Service: Urology;  Laterality: N/A;  Total number of seeds - 52  . REPAIR RIGHT INGUINAL HERNIA W/ MESH  09-10-1999  . TOTAL HIP ARTHROPLASTY Right 03/20/2013   Procedure: RIGHT TOTAL HIP ARTHROPLASTY ANTERIOR APPROACH;  Surgeon: [name], MD;  Location: [facility];  Service: Orthopedics;  Laterality: Right;  . TOTAL HIP REVISION Right 05/14/2013   Procedure: open reduction internal fixation REVISION RIGHT HIP ;  Surgeon: [name], MD;  Location: [facility];  Service: Orthopedics;  Laterality: Right;  . UMBILICAL HERNIA REPAIR  1998   epigastric     Family History  Problem Relation Age of Onset  . Hypertension Mother   . Prostate cancer Brother        surgery/cured  . Lung cancer Brother        new primary/same brother     Social History   Socioeconomic History  . Marital status: Married    Spouse name: Alison Murray  . Number of children: 4  . Years of education: College  . Highest education level: Not on file  Occupational History  . Occupation:       Employer: [employer]    Comment: Retired  Tobacco Use  . Smoking status: Former Smoker    Packs/day: 0.50    Years: 62.00    Pack years:  31.00    Types: Cigarettes  . Smokeless tobacco: Never Used  . Tobacco comment: smoke one or two per day  Substance and Sexual Activity  . Alcohol use: No    Alcohol/week: 1.0 standard drinks    Types: 1 Cans of beer per week  . Drug use: No    Comment: quit smoking 02/2012  . Sexual activity: Not Currently    Partners:  Female  Other Topics Concern  . Not on file  Social History Narrative   Patient is Married Designer, television/film set), and lives at home with his wife 68 years   Patient has 3 daughters and 1 son.   Patient has a college education.   Patient is right-handed.   Patient drinks one cup of coffee, 1-2 cups of tea daily and rarely drinks soda.   Retired from [employer] Strain:   . Difficulty of Paying Living Expenses: Not on file  Food Insecurity:   . Worried About Charity fundraiser in the Last Year: Not on file  . Ran Out of Food in the Last Year: Not on file  Transportation Needs:   . Lack of Transportation (Medical): Not on file  . Lack of Transportation (Non-Medical): Not on file  Physical Activity:   . Days of Exercise per Week: Not on file  . Minutes of Exercise per Session: Not on file  Stress:   . Feeling of Stress : Not on file  Social Connections:   . Frequency of Communication with Friends and Family: Not on file  . Frequency of Social Gatherings with Friends and Family: Not on file  . Attends Religious Services: Not on file  . Active Member of Clubs or Organizations: Not on file  . Attends Archivist Meetings: Not on file  . Marital Status: Not on file  Intimate Partner Violence:   . Fear of Current or Ex-Partner: Not on file  . Emotionally Abused: Not on file  . Physically Abused: Not on file  . Sexually Abused: Not on file     BP 140/88   Pulse 77   Ht 6\' 3"  (1.905 m)   Wt 174 lb (78.9 kg)   SpO2 99%   BMI 21.75 kg/m   Physical Exam:  Well appearing NAD HEENT: Unremarkable Neck:  No  JVD, no thyromegally Lymphatics:  No adenopathy Back:  No CVA tenderness Lungs:  Clear with no wheezes HEART:  Regular rate rhythm, no murmurs, no rubs, no clicks Abd:  soft, positive bowel sounds, no organomegally, no rebound, no guarding Ext:  2 plus pulses, no edema, no cyanosis, no clubbing Skin:  No rashes no nodules Neuro:  CN II through XII intact, motor grossly intact  EKG - NSR with RBBB and PAC's  DEVICE  Normal device function.  See PaceArt for details.   Assess/Plan: 1. Sinus node dysfunction - he is doing well, s/p PPM insertion.  2. Orthostasis - his symptoms have improved. I encouraged him to remain well hydrated. I encouraged him to have a little bit of an elevated bp.  3. PPM - his Medtronic DDD PM is working normally. We will recheck in several months.  Mikle Bosworth.D.

## 2019-02-16 DIAGNOSIS — E7849 Other hyperlipidemia: Secondary | ICD-10-CM | POA: Diagnosis not present

## 2019-02-16 DIAGNOSIS — M810 Age-related osteoporosis without current pathological fracture: Secondary | ICD-10-CM | POA: Diagnosis not present

## 2019-02-16 DIAGNOSIS — Z125 Encounter for screening for malignant neoplasm of prostate: Secondary | ICD-10-CM | POA: Diagnosis not present

## 2019-02-16 DIAGNOSIS — E041 Nontoxic single thyroid nodule: Secondary | ICD-10-CM | POA: Diagnosis not present

## 2019-02-16 DIAGNOSIS — Z0000 Encounter for general adult medical examination without abnormal findings: Secondary | ICD-10-CM | POA: Diagnosis not present

## 2019-02-16 DIAGNOSIS — I10 Essential (primary) hypertension: Secondary | ICD-10-CM | POA: Diagnosis not present

## 2019-02-22 DIAGNOSIS — G4730 Sleep apnea, unspecified: Secondary | ICD-10-CM | POA: Diagnosis not present

## 2019-02-22 DIAGNOSIS — G259 Extrapyramidal and movement disorder, unspecified: Secondary | ICD-10-CM | POA: Diagnosis not present

## 2019-02-22 DIAGNOSIS — M3500 Sicca syndrome, unspecified: Secondary | ICD-10-CM | POA: Diagnosis not present

## 2019-02-22 DIAGNOSIS — R42 Dizziness and giddiness: Secondary | ICD-10-CM | POA: Diagnosis not present

## 2019-02-22 DIAGNOSIS — R269 Unspecified abnormalities of gait and mobility: Secondary | ICD-10-CM | POA: Diagnosis not present

## 2019-02-22 DIAGNOSIS — G609 Hereditary and idiopathic neuropathy, unspecified: Secondary | ICD-10-CM | POA: Diagnosis not present

## 2019-02-22 DIAGNOSIS — Z1331 Encounter for screening for depression: Secondary | ICD-10-CM | POA: Diagnosis not present

## 2019-02-22 DIAGNOSIS — R82998 Other abnormal findings in urine: Secondary | ICD-10-CM | POA: Diagnosis not present

## 2019-02-22 DIAGNOSIS — Z0000 Encounter for general adult medical examination without abnormal findings: Secondary | ICD-10-CM | POA: Diagnosis not present

## 2019-02-22 DIAGNOSIS — N183 Chronic kidney disease, stage 3 unspecified: Secondary | ICD-10-CM | POA: Diagnosis not present

## 2019-02-22 DIAGNOSIS — I10 Essential (primary) hypertension: Secondary | ICD-10-CM | POA: Diagnosis not present

## 2019-02-22 DIAGNOSIS — G20 Parkinson's disease: Secondary | ICD-10-CM | POA: Diagnosis not present

## 2019-02-23 DIAGNOSIS — I471 Supraventricular tachycardia: Secondary | ICD-10-CM | POA: Diagnosis not present

## 2019-02-27 ENCOUNTER — Ambulatory Visit: Payer: Self-pay | Admitting: Family Medicine

## 2019-02-27 DIAGNOSIS — M3500 Sicca syndrome, unspecified: Secondary | ICD-10-CM | POA: Diagnosis not present

## 2019-02-27 DIAGNOSIS — Z6822 Body mass index (BMI) 22.0-22.9, adult: Secondary | ICD-10-CM | POA: Diagnosis not present

## 2019-02-27 DIAGNOSIS — R5383 Other fatigue: Secondary | ICD-10-CM | POA: Diagnosis not present

## 2019-02-27 DIAGNOSIS — M2550 Pain in unspecified joint: Secondary | ICD-10-CM | POA: Diagnosis not present

## 2019-02-27 LAB — CUP PACEART INCLINIC DEVICE CHECK
Battery Remaining Longevity: 154 mo
Battery Voltage: 3.04 V
Brady Statistic AP VP Percent: 0.02 %
Brady Statistic AP VS Percent: 70.37 %
Brady Statistic AS VP Percent: 0.02 %
Brady Statistic AS VS Percent: 29.59 %
Brady Statistic RA Percent Paced: 70.16 %
Brady Statistic RV Percent Paced: 0.04 %
Date Time Interrogation Session: 20210121150700
Implantable Lead Implant Date: 20190823
Implantable Lead Implant Date: 20190823
Implantable Lead Location: 753859
Implantable Lead Location: 753860
Implantable Lead Model: 5076
Implantable Lead Model: 5076
Implantable Pulse Generator Implant Date: 20190823
Lead Channel Impedance Value: 304 Ohm
Lead Channel Impedance Value: 361 Ohm
Lead Channel Impedance Value: 399 Ohm
Lead Channel Impedance Value: 475 Ohm
Lead Channel Pacing Threshold Amplitude: 0.75 V
Lead Channel Pacing Threshold Amplitude: 1 V
Lead Channel Pacing Threshold Pulse Width: 0.4 ms
Lead Channel Pacing Threshold Pulse Width: 0.4 ms
Lead Channel Sensing Intrinsic Amplitude: 1.9 mV
Lead Channel Sensing Intrinsic Amplitude: 11.6 mV
Lead Channel Setting Pacing Amplitude: 1.5 V
Lead Channel Setting Pacing Amplitude: 2.5 V
Lead Channel Setting Pacing Pulse Width: 0.4 ms
Lead Channel Setting Sensing Sensitivity: 1.2 mV

## 2019-03-05 ENCOUNTER — Ambulatory Visit: Payer: Medicare HMO | Admitting: Podiatry

## 2019-04-25 ENCOUNTER — Ambulatory Visit (INDEPENDENT_AMBULATORY_CARE_PROVIDER_SITE_OTHER): Payer: Medicare HMO | Admitting: Family Medicine

## 2019-04-25 ENCOUNTER — Encounter: Payer: Self-pay | Admitting: Family Medicine

## 2019-04-25 ENCOUNTER — Other Ambulatory Visit: Payer: Self-pay

## 2019-04-25 VITALS — BP 140/88 | HR 98 | Temp 97.8°F | Ht 72.0 in | Wt 174.0 lb

## 2019-04-25 DIAGNOSIS — G4731 Primary central sleep apnea: Secondary | ICD-10-CM | POA: Diagnosis not present

## 2019-04-25 DIAGNOSIS — G214 Vascular parkinsonism: Secondary | ICD-10-CM

## 2019-04-25 DIAGNOSIS — G4730 Sleep apnea, unspecified: Secondary | ICD-10-CM

## 2019-04-25 NOTE — Patient Instructions (Addendum)
Please continue using your CPAP regularly. While your insurance requires that you use CPAP at least 4 hours each night on 70% of the nights, I recommend, that you not skip any nights and use it throughout the night if you can. Getting used to CPAP and staying with the treatment long term does take time and patience and discipline. Untreated obstructive sleep apnea when it is moderate to severe can have an adverse impact on cardiovascular health and raise her risk for heart disease, arrhythmias, hypertension, congestive heart failure, stroke and diabetes. Untreated obstructive sleep apnea causes sleep disruption, nonrestorative sleep, and sleep deprivation. This can have an impact on your day to day functioning and cause daytime sleepiness and impairment of cognitive function, memory loss, mood disturbance, and problems focussing. Using CPAP regularly can improve these symptoms.   Follow up in 6 months    Sleep Apnea Sleep apnea affects breathing during sleep. It causes breathing to stop for a short time or to become shallow. It can also increase the risk of:  Heart attack.  Stroke.  Being very overweight (obese).  Diabetes.  Heart failure.  Irregular heartbeat. The goal of treatment is to help you breathe normally again. What are the causes? There are three kinds of sleep apnea:  Obstructive sleep apnea. This is caused by a blocked or collapsed airway.  Central sleep apnea. This happens when the brain does not send the right signals to the muscles that control breathing.  Mixed sleep apnea. This is a combination of obstructive and central sleep apnea. The most common cause of this condition is a collapsed or blocked airway. This can happen if:  Your throat muscles are too relaxed.  Your tongue and tonsils are too large.  You are overweight.  Your airway is too small. What increases the risk?  Being overweight.  Smoking.  Having a small airway.  Being older.  Being  male.  Drinking alcohol.  Taking medicines to calm yourself (sedatives or tranquilizers).  Having family members with the condition. What are the signs or symptoms?  Trouble staying asleep.  Being sleepy or tired during the day.  Getting angry a lot.  Loud snoring.  Headaches in the morning.  Not being able to focus your mind (concentrate).  Forgetting things.  Less interest in sex.  Mood swings.  Personality changes.  Feelings of sadness (depression).  Waking up a lot during the night to pee (urinate).  Dry mouth.  Sore throat. How is this diagnosed?  Your medical history.  A physical exam.  A test that is done when you are sleeping (sleep study). The test is most often done in a sleep lab but may also be done at home. How is this treated?   Sleeping on your side.  Using a medicine to get rid of mucus in your nose (decongestant).  Avoiding the use of alcohol, medicines to help you relax, or certain pain medicines (narcotics).  Losing weight, if needed.  Changing your diet.  Not smoking.  Using a machine to open your airway while you sleep, such as: ? An oral appliance. This is a mouthpiece that shifts your lower jaw forward. ? A CPAP device. This device blows air through a mask when you breathe out (exhale). ? An EPAP device. This has valves that you put in each nostril. ? A BPAP device. This device blows air through a mask when you breathe in (inhale) and breathe out.  Having surgery if other treatments do not work. It   is important to get treatment for sleep apnea. Without treatment, it can lead to:  High blood pressure.  Coronary artery disease.  In men, not being able to have an erection (impotence).  Reduced thinking ability. Follow these instructions at home: Lifestyle  Make changes that your doctor recommends.  Eat a healthy diet.  Lose weight if needed.  Avoid alcohol, medicines to help you relax, and some pain  medicines.  Do not use any products that contain nicotine or tobacco, such as cigarettes, e-cigarettes, and chewing tobacco. If you need help quitting, ask your doctor. General instructions  Take over-the-counter and prescription medicines only as told by your doctor.  If you were given a machine to use while you sleep, use it only as told by your doctor.  If you are having surgery, make sure to tell your doctor you have sleep apnea. You may need to bring your device with you.  Keep all follow-up visits as told by your doctor. This is important. Contact a doctor if:  The machine that you were given to use during sleep bothers you or does not seem to be working.  You do not get better.  You get worse. Get help right away if:  Your chest hurts.  You have trouble breathing in enough air.  You have an uncomfortable feeling in your back, arms, or stomach.  You have trouble talking.  One side of your body feels weak.  A part of your face is hanging down. These symptoms may be an emergency. Do not wait to see if the symptoms will go away. Get medical help right away. Call your local emergency services (911 in the [place] Do not drive yourself to the hospital. Summary  This condition affects breathing during sleep.  The most common cause is a collapsed or blocked airway.  The goal of treatment is to help you breathe normally while you sleep. This information is not intended to replace advice given to you by your health care provider. Make sure you discuss any questions you have with your health care provider. Document Revised: 10/28/2017 Document Reviewed: 09/06/2017 Elsevier Patient Education  2020 [employer]  

## 2019-04-25 NOTE — Progress Notes (Signed)
PATIENT: Derek Carr DOB: 1931/11/16  REASON FOR VISIT: follow up HISTORY FROM: patient  Chief Complaint  Patient presents with  . Follow-up    ROOM 2, Daughter, OSA      HISTORY OF PRESENT ILLNESS: Today 04/25/19 Derek Carr is a 86 y.o. male here today for follow up for complex sleep apnea on CPAP therapy. He reports that he is doing well. His daughter presents with him today and aids in history.  She reports that he has been fairly compliant with usage.  She lives with her mother and father and helps to take care of both of them.  She reports difficulty with compliance on the days that she is working.  She also reports noticing that there was a hole in his mask.  She has recently received a new mask but has not used CPAP over the past week due to a scratch on the patient's face.  The scratch is now healed.  He continues to follow-up closely with Dr. Delice Lesch for Parkinson's and dementia.  Compliance report dated 03/25/2019 through 04/23/2019 reveals that he used CPAP 20 of the last 30 days for compliance of 67%.  He used CPAP greater than 4 hours 15 of the past 30 days for compliance of 50%.  Average usage on days used was 7 hours and 30 minutes.  Residual AHI was 5.9 on 10 to 15 cm of water and an EPR of 1.  There was a significant leak noted in the 95th percentile of 46.3 L/min.  HISTORY: (copied from Dr Dohmeier's note on 05/31/2018)  Mr. Bussa is a stroke patient of Dr. Clydene Fake and was referred to the [facility] by Dr. Leonie Man. He tested positive for Central- Complex Sleep Apnea.  It took two attended sleep studies to treat him adequately. He uses autotitration CPAP and this controlls his AHI. Currently at 10-15 cm water with 1 cm EPR and AHI of 3.1/h.  He has likely vascular parkinsonism/ dementia , on Aricept. Pacemaker patient.  Observations/Objective: Reviewed CPAP compliance.   Daughter reports ongoing REM activity. Has been unable to get someone to place his CPAP on  him on weekends and Wednesdays. Reviewed EEG report.   Derek Seat, MD   HPI:  09-29-2017, RV . Derek Carr is a 86 y.o. male, seen here with his daughter ,after repeated TIA -strokes and syncopes.  He also was feeling no benefit from BiPAP use -he  had been using ASV since 2014. Patient has in the meantime seen Dr. Lovena Le for a loop recorder, and ended up instead with a pacemaker - He had a syncope from bradycardia.  *The patient had actually used a ASV, after he had been diagnosed with complex and mostly central sleep apnea in August 2015, CPAP at low pressures cost central apneas to arise with an AHI of 27.7 and supine sleep there was an accentuation of 34.3, after he failed CPAP and BiPAP ASV was initiated at the time he used a pressure support minimum of 4 maximum of 10 cmH2O and end expiratory pressure of 5 cmH2O.  The AHI reached 0.0 and the patient actually could finally sleep the sleep efficiency rose to 57% from previously only 20% he also had no longer oxygen desaturation he had no PLM's, but he remained in what I suspected was atrial fibrillation with a variable R to R interval.  Mr. Hardiman now returned first for a home sleep study just to screen if apnea was still present and  to work severity.  A watch [name] was used the study was performed on 24 August 2017 after referral by Foye Clock, it revealed complex and dominantly central sleep apnea without hypoxemia.  There was tachycardia and bradycardia noted, the non-REM AHI was higher than the REM sleep AHI indicating the central nature of apnea.  In supine sleep AHI was 53.4 on his left side AHI was 4.2/h.  The patient had endorsed the Epworth score at 20 points FS is at 47 points, BMI was 22.7.  He was asked to return for a titration. The titration to continuous positive airway pressure took place on 23 September 2017 the patient started 55 cm water pressure and was advanced to 13 sleep latency was only 15 minutes and sleep  efficiency was 90.7% his central sleep apnea responded well to CPAP at 13 cmH2O was eliminated also snoring and reduced all arousals.  He did not have hypoxemia and he did have a paced EKG rhythm.  He was fitted to a Risk manager and Paykel FFM model Simplus in medium size .  I believe that his pacemaker corrected the diastolic heart failure and improved response to CPAP.  07-07-2017.  Derek Carr is a 86 y.o. male , seen here as in a referral from [facility] ,  After repeated TIA -STROKE team patient , last seen by Dory Peru, NP .  Patient does have a diagnosis of sleep apnea but is having increasingly trouble using his CPAP mask.  The daughter noticed excessive daytime sleepiness and he can fall asleep even in the middle of conversations. Recommended Rv with me for CPAP follow up . The patient denies any significant weight loss, muscle fasciculations, muscle wasting or weakness. He is currently on dual antiplatelet therapy aspirin and Plavix daughter get well without bruising or bleeding. The patient was unable to use his CPAP for the last year, according to his daughter.  He has a very high fall risk, he urinates frequently at night and he has to rise from the bed to relieve himself.  She felt that the CPAP was an additional endurance something he struggled with at night to take off and put back on. During the office follow-up with Janett Billow from 21 Jun 2017 she did an extensive medical records review the stroke service admissions before.  She also stated that in her previous appointment MD had recommended to start Aricept -but given his dizziness and tendency to have bradycardia Aricept was not started and should not be started.   Memory and concentration have been relatively stable but there is evidence of vascular dementia, he continues to take his aspirin without bleeding or bruising he does not have gum bleeding.  No nosebleeds.  EEG was normal and negative for seizure activity.  PT OT continues  to come to the house for the first month, after he suffered a fracture to his left ankle.  He is currently using a boot.  There have been no worsening stroke symptoms or TIA symptoms since his last hospitalization in February.   03-21-2017 : Dr Leonie Man, primary neurologist  HPI: Mr. Bogie is a 86 year old African-American gentleman who is accompanied today by his daughter. History is obtained from them as well as review of electronic medical records. I have personally reviewed imaging films.Keaten Lira Donahueis an 86 y.o.malewith hypertension and hyperlipidemia.  Per patient approximately 7:00 in the morning on 03/01/2017 patient went to get a right cataract surgery. He did not stop his aspirin or Plavix for this per  patient. He then took a nap at approximately 1300 hrs.patient awoke at 6 PM and daughter called EMSforright-sided weakness,facial droop,dysarthria.Daughter states that he was confused but patient states that he was having a difficult time understanding him which would be more of a receptive aphasia.By the time EMS arrived (approximately 10 minutes)all these symptoms had resolved the exception of the right upper extremity weakness--which also quickly resolved.  Patient is noted to have a TIA back in December 2018 (as noted below)and was placed on Plavix along with his aspirin by his primary care physician. Patient was not seen in the hospital at that time.Per daughter patient has had one further event prior to this. At that time they noticed that he had a left facial droop along with leaning to the left. He did not have any dysarthria at that time.The symptoms lasted very quickly for approximately 10 minutes.   Sleep habits are as follows: The patient's bedtime is around 8. - 8.30 PM and he is asleep promptly, but soon wakes for 4-7 bathroom breaks- very frequently.  Uses a urinal but needs to stand up, when urinating in a seated position ' flow is slow" . Some urge   incontinence. He denies dreaming frequently , rarely night mares, but he yells out in his sleep , kicks , he is not waking up and has The patient sleeps in a regular bed. Not adjustable, uses 1 pillow. Sleeps supine since he needed to adjust due to FFM, now still supine without CPAP use.  He rises at 8.30 AM, and averages 8 hours of sleep with 12 hours in bed , and by 10 or 11 he can easily take another nap.   Social history: daughter is main caretaker.    REVIEW OF SYSTEMS: Out of a complete 14 system review of symptoms, the patient complains only of the following symptoms, sleep apnea, fatigue, imbalance, memory loss, and all other reviewed systems are negative.  ESS: FSS:  ALLERGIES: Allergies  Allergen Reactions  . Oxycodone Other (See Comments)  . Dilaudid [Hydromorphone Hcl] Other (See Comments)    Confusion   . Methocarbamol Other (See Comments)    Drowsiness   . Percodan [Oxycodone-Aspirin] Other (See Comments)    Confusion   . Tramadol Other (See Comments)    Drowsiness   . Vicodin [Hydrocodone-Acetaminophen] Other (See Comments)    Confusion     HOME MEDICATIONS: Outpatient Medications Prior to Visit  Medication Sig Dispense Refill  . acetaminophen (TYLENOL) 500 MG tablet Take 1,000 mg by mouth 2 (two) times daily.     . Ascorbic Acid (VITAMIN C) 1000 MG tablet Take 1,000 mg by mouth daily.    Marland Kitchen aspirin EC 81 MG tablet Take 81 mg by mouth daily.    Marland Kitchen b complex vitamins tablet Take 1 tablet by mouth daily.    . Calcium Carbonate Antacid (TUMS PO) Take 1 tablet by mouth daily.    . carbidopa-levodopa (SINEMET IR) 25-100 MG tablet Take 1 tablet daily 90 tablet 3  . Cholecalciferol (VITAMIN D-3) 125 MCG (5000 UT) TABS Take 1 capsule by mouth 2 (two) times daily.     . clobetasol cream (TEMOVATE) [id number] % Apply 1 application topically daily as needed.     . Cyanocobalamin 1000 MCG TBCR Take 1 tablet by mouth daily.    . folic acid (FOLVITE) 1 MG tablet Take 1 mg by  mouth daily.    . Hydroxychloroquine Sulfate (PLAQUENIL PO) Take by mouth.    . lidocaine-prilocaine (EMLA)  cream Apply 1 application topically as needed. 30 g 1  . meclizine (ANTIVERT) 12.5 MG tablet Take 1 tablet as needed for dizziness 10 tablet 11  . NONFORMULARY OR COMPOUNDED ITEM [facility] Neuropathy Cream 120 grams 3 refills  Sent in 07-04-18    . Omega 3 1000 MG CAPS Take 1 tablet by mouth daily.    . Probiotic Product (PROBIOTIC PO) Take 1 capsule by mouth daily.     . Probiotic Product (PROBIOTIC-10 PO) Take by mouth.    . Ubiquinol (QUNOL COQ10/UBIQUINOL/MEGA) 100 MG CAPS Take 1 tablet by mouth at bedtime.     Marland Kitchen umeclidinium-vilanterol (ANORO ELLIPTA) 62.5-25 MCG/INH AEPB Only open the device one time and then take your two separate drags to be sure you get it all 1 each 11  . UNABLE TO FIND Med Name: CPAP per Dr Beacher May    . valACYclovir (VALTREX) 1000 MG tablet Take 1,000 mg by mouth daily.    Marland Kitchen zinc gluconate 50 MG tablet Take 50 mg by mouth daily.     No facility-administered medications prior to visit.    PAST MEDICAL HISTORY: Past Medical History:  Diagnosis Date  . AAA (abdominal aortic aneurysm) ([employer])   . Anemia   . Ankle fracture    left  . Aortic regurgitation   . Atherosclerosis   . Complication of anesthesia    CONFUSION - Pt's family very concerned about this  . DDD (degenerative disc disease)   . Degenerative arthritis   . Dermatitis, atopic ARMS AND LEGS  . Diverticulosis   . Erectile dysfunction   . Frequency of urination   . Glaucoma   . History of asbestos exposure   . History of radiation therapy 8/19-13-10/18/11   prostate, 44 GY  . History of shingles 2012-- BILATERAL EYES--  NO RESIDUAL  . Hyperlipidemia   . Hypertension   . Inguinal hernia   . Lumbar scoliosis   . Nocturia   . OA (osteoarthritis) of hip RIGHT  . Prostate cancer ([place]) 05/11/2011   bx=Adenocarcinoma,gleason3+4=7, 4+4=8,PSA=10.30volume=45.7cc  . Renal cyst    bilateral   . Smokers' cough ([place])   . Stroke ([place])   . Thyroid cyst     PAST SURGICAL HISTORY: Past Surgical History:  Procedure Laterality Date  . ATTEMPTED LEFT VATS/ LEFT THORACOTOMY/ RESECTION OF THE ENORMOUS, PROBABLE BRONCHOGENIC CYST  01-04-2005  DR Arlyce Dice  . CARDIAC CATHETERIZATION  02-24-2009  DR NASHER   MINOR CORONARY ARTERY IRREGULARITIES/ NORMAL LVSF/ EF 65-70%  . CATARACT EXTRACTION    . COLONOSCOPY W/ POLYPECTOMY    . CYSTOSCOPY  11/15/2011   Procedure: CYSTOSCOPY;  Surgeon: Dutch Gray, MD;  [facility];  Service: Urology;  Laterality: N/A;  no seeds seen in bladder  . esophageus cyst removal  YRS AGO  . [facility] [place]  . ORIF ANKLE FRACTURE Left 05/03/2017  . ORIF ANKLE FRACTURE Left 05/03/2017   Procedure: OPEN REDUCTION INTERNAL FIXATION (ORIF) BIMALLEOLAR  ANKLE FRACTURE;  Surgeon: Wylene Simmer, MD;  Location: [facility];  Service: Orthopedics;  Laterality: Left;  . PACEMAKER IMPLANT N/A 09/16/2017   Procedure: PACEMAKER IMPLANT;  Surgeon: Constance Haw, MD;  Location: [facility];  Service: Cardiovascular;  Laterality: N/A;  . PROSTATE BIOPSY  05/11/11   Adenocarcinoma ([facility] OFFICE)  . RADIOACTIVE SEED IMPLANT  11/15/2011   Procedure: RADIOACTIVE SEED IMPLANT;  Surgeon: Dutch Gray, MD;  Location: [facility];  Service: Urology;  Laterality: N/A;  Total number of seeds - 52  . REPAIR RIGHT INGUINAL HERNIA W/ MESH  09-10-1999  . TOTAL HIP ARTHROPLASTY Right 03/20/2013   Procedure: RIGHT TOTAL HIP ARTHROPLASTY ANTERIOR APPROACH;  Surgeon: [name], MD;  Location: [facility];  Service: Orthopedics;  Laterality: Right;  . TOTAL HIP REVISION Right 05/14/2013   Procedure: open reduction internal fixation REVISION RIGHT HIP ;  Surgeon: [name], MD;  Location: [facility];  Service: Orthopedics;  Laterality: Right;  . UMBILICAL HERNIA REPAIR  1998   epigastric    FAMILY HISTORY: Family History  Problem Relation Age of  Onset  . Hypertension Mother   . Prostate cancer Brother        surgery/cured  . Lung cancer Brother        new primary/same brother    SOCIAL HISTORY: Social History   Socioeconomic History  . Marital status: Married    Spouse name: Alison Murray  . Number of children: 4  . Years of education: College  . Highest education level: Not on file  Occupational History  . Occupation:       Employer: [employer]    Comment: Retired  Tobacco Use  . Smoking status: Former Smoker    Packs/day: 0.50    Years: 62.00    Pack years: 31.00    Types: Cigarettes  . Smokeless tobacco: Never Used  . Tobacco comment: smoke one or two per day  Substance and Sexual Activity  . Alcohol use: No    Alcohol/week: 1.0 standard drinks    Types: 1 Cans of beer per week  . Drug use: No    Comment: quit smoking 02/2012  . Sexual activity: Not Currently    Partners: Female  Other Topics Concern  . Not on file  Social History Narrative   Patient is Married Designer, television/film set), and lives at home with his wife 48 years   Patient has 3 daughters and 1 son.   Patient has a college education.   Patient is right-handed.   Patient drinks one cup of coffee, 1-2 cups of tea daily and rarely drinks soda.   Retired from [employer] Strain:   . Difficulty of Paying Living Expenses:   Food Insecurity:   . Worried About Charity fundraiser in the Last Year:   . Arboriculturist in the Last Year:   Transportation Needs:   . Film/video editor (Medical):   Marland Kitchen Lack of Transportation (Non-Medical):   Physical Activity:   . Days of Exercise per Week:   . Minutes of Exercise per Session:   Stress:   . Feeling of Stress :   Social Connections:   . Frequency of Communication with Friends and Family:   . Frequency of Social Gatherings with Friends and Family:   . Attends Religious Services:   . Active Member of Clubs or Organizations:   . Attends Archivist  Meetings:   Marland Kitchen Marital Status:   Intimate Partner Violence:   . Fear of Current or Ex-Partner:   . Emotionally Abused:   Marland Kitchen Physically Abused:   . Sexually Abused:       PHYSICAL EXAM  Vitals:   04/25/19 1147  BP: 140/88  Pulse: 98  Temp: 97.8 F (36.6 C)  Weight: 174 lb (78.9 kg)  Height: 6' (1.829 m)   Body mass index is 23.6 kg/m.  Generalized: Well developed, in no acute distress  Neurological  examination  Mentation: Alert oriented to time, place, history taking. Follows all commands speech and language fluent Cranial nerve II-XII: Pupils were equal round reactive to light. Extraocular movements were full, visual field were full  Motor: The motor testing reveals 5 over 5 strength of all 4 extremities. Good symmetric motor tone is noted throughout.  Gait and station: Gait is stable with walker    DIAGNOSTIC DATA (LABS, IMAGING, TESTING) - I reviewed patient records, labs, notes, testing and imaging myself where available.  MMSE - Mini Mental State Exam 04/06/2018 11/24/2017 06/21/2017  Orientation to time 4 3 2   Orientation to Place 3 4 4   Registration 3 3 3   Attention/ Calculation 2 1 0  Recall 0 0 0  Language- name 2 objects 2 2 2   Language- repeat 0 0 1  Language- follow 3 step command 2 3 3   Language- read & follow direction 1 1 1   Write a sentence 1 0 1  Copy design 1 0 1  Total score 19 17 18      Lab Results  Component Value Date   WBC 3.3 (L) 08/30/2018   HGB 11.6 (L) 08/30/2018   HCT 35.7 (L) 08/30/2018   MCV 88.6 08/30/2018   PLT 195 08/30/2018      Component Value Date/Time   NA 142 08/30/2018 0435   NA 146 (H) 01/26/2018 1535   K 4.1 08/30/2018 0435   CL 111 08/30/2018 0435   CO2 24 08/30/2018 0435   GLUCOSE 116 (H) 08/30/2018 0435   BUN 18 08/30/2018 0435   BUN 15 01/26/2018 1535   CREATININE 1.50 (H) 08/30/2018 0435   CALCIUM 9.5 08/30/2018 0435   PROT 7.1 08/30/2018 0435   PROT 7.6 01/26/2018 1535   ALBUMIN 3.1 (L) 08/30/2018 0435     ALBUMIN 4.1 01/26/2018 1535   AST 32 08/30/2018 0435   ALT 11 08/30/2018 0435   ALKPHOS 78 08/30/2018 0435   BILITOT 0.3 08/30/2018 0435   BILITOT <0.2 01/26/2018 1535   GFRNONAA 42 (L) 08/30/2018 0435   GFRAA 49 (L) 08/30/2018 0435   Lab Results  Component Value Date   CHOL 185 01/26/2018   HDL 39 (L) 01/26/2018   LDLCALC 125 (H) 01/26/2018   TRIG 104 01/26/2018   CHOLHDL 4.7 01/26/2018   Lab Results  Component Value Date   HGBA1C 5.9 (H) 01/26/2018   Lab Results  Component Value Date   VITAMINB12 914 08/22/2018   Lab Results  Component Value Date   TSH 0.76 08/22/2018    ASSESSMENT AND PLAN 86 y.o. year old male  has a past medical history of AAA (abdominal aortic aneurysm) ([place]), Anemia, Ankle fracture, Aortic regurgitation, Atherosclerosis, Complication of anesthesia, DDD (degenerative disc disease), Degenerative arthritis, Dermatitis, atopic (ARMS AND LEGS), Diverticulosis, Erectile dysfunction, Frequency of urination, Glaucoma, History of asbestos exposure, History of radiation therapy (8/19-13-10/18/11), History of shingles (2012-- BILATERAL EYES--  NO RESIDUAL), Hyperlipidemia, Hypertension, Inguinal hernia, Lumbar scoliosis, Nocturia, OA (osteoarthritis) of hip (RIGHT), Prostate cancer ([place]) (05/11/2011), Renal cyst, Smokers' cough ([place]), Stroke ([place]), and Thyroid cyst. here with     ICD-10-CM   1. Complex sleep apnea syndrome  G47.31   2. Sleep apnea with use of continuous positive airway pressure (CPAP)  G47.30   3. Vascular parkinsonism [facility])  G21.4     Mr Manrriquez is doing fairly well today.  He has had some difficulty with compliance over the past 30 days.  His daughter feels that this correlates with the days that  she is not at home.  Compliance report reveals suboptimal compliance with both daily and 4-hour usage.  Mr. Merithew was encouraged to use CPAP every night.  I have encouraged his daughter to help educate her mother so that she may assist on days that the  daughter is at work.  They will also monitor for excessive air leaks.  I am hopeful that his new mask may help correct this leak.  They were educated that they may reach out to the DME company as well if needed for help troubleshooting leak.  We have discussed risk of untreated sleep apnea.  He will continue close follow-up with Dr. Delice Lesch.  Healthy lifestyle habits encouraged.  He will follow-up in 6 months, sooner if needed.  He and his daughter both verbalized understanding and agreement with the plan.    No orders of the defined types were placed in this encounter.    No orders of the defined types were placed in this encounter.     I spent 25 minutes with the patient. 50% of this time was spent counseling and educating patient on plan of care and medications.    Debbora Presto, FNP-C 04/25/2019, 12:23 PM [facility] [address] [place], [place] [address] [phone]

## 2019-05-02 IMAGING — MR MR HEAD W/O CM
10 of 11 series · 43 of 48 positions shown · non-contrast
Comparison: 09/14/2017.

CLINICAL DATA: Stroke follow-up. Difficulty chewing food and
choking. Dementia.

EXAM:
MRI HEAD WITHOUT CONTRAST
TECHNIQUE: Multiplanar, multiecho pulse sequences of the brain and surrounding
structures were obtained without intravenous contrast.

[Series 5: DWI · axial · 3.0mm · 0.88mm/px · z∈[-54,+92]mm · 8 of 100 slices shown (1 of 4)]
[im 1/100]
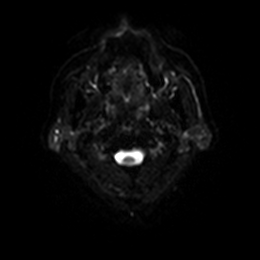
[im 15/100]
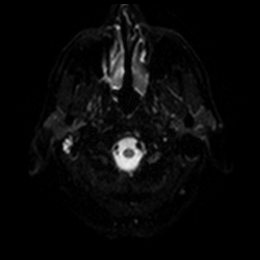
[im 29/100]
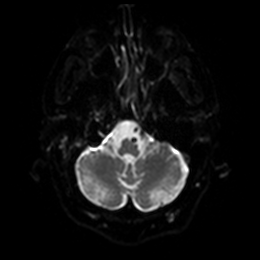
[im 43/100]
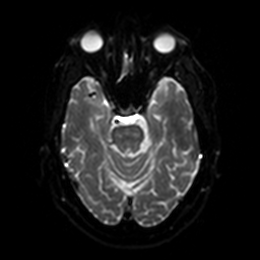
[im 57/100]
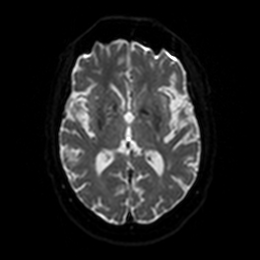
[im 71/100]
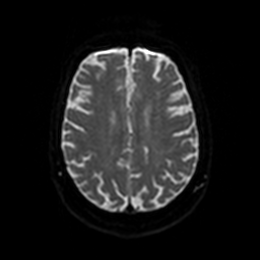
[im 85/100]
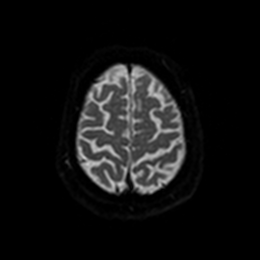
[im 100/100]
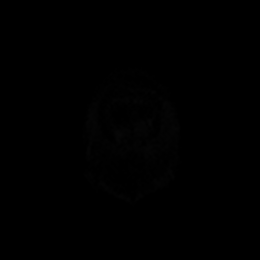

[Series 6: DWI · axial · 3.0mm · 0.88mm/px · z∈[-54,+92]mm · 5 of 50 slices shown (2 of 4)]
[im 1/50]
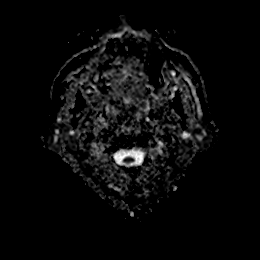
[im 13/50]
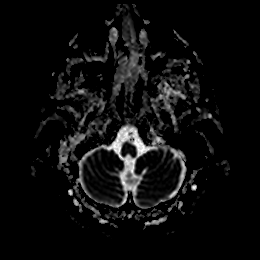
[im 25/50]
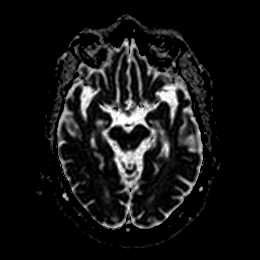
[im 37/50]
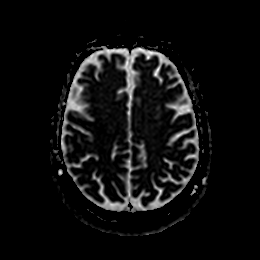
[im 50/50]
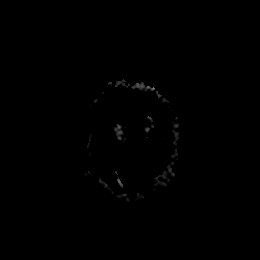

[Series 7: DWI · coronal · 4.0mm · 0.88mm/px · 6 of 64 slices shown (3 of 4)]
[im 1/64]
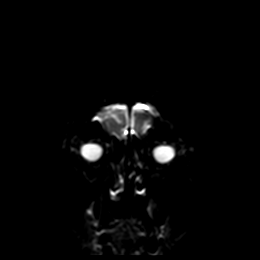
[im 13/64]
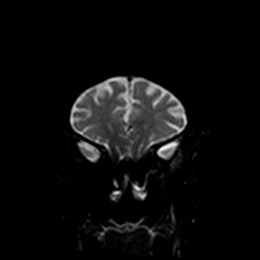
[im 26/64]
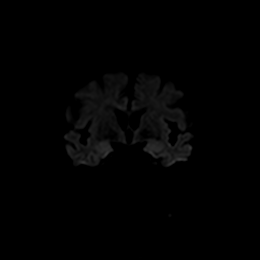
[im 38/64]
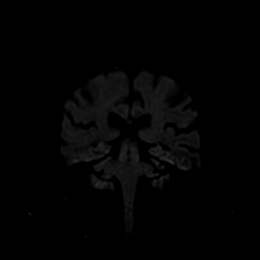
[im 51/64]
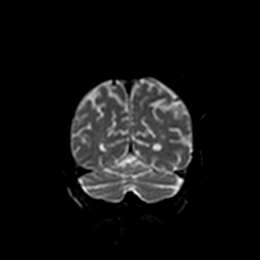
[im 64/64]
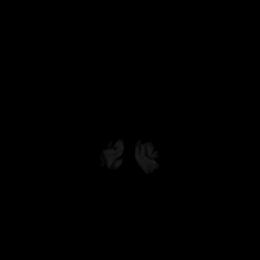

[Series 8: DWI · coronal · 4.0mm · 0.88mm/px · 3 of 32 slices shown (4 of 4)]
[im 1/32]
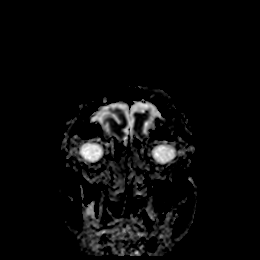
[im 16/32]
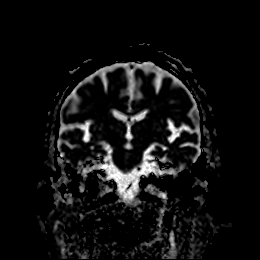
[im 32/32]
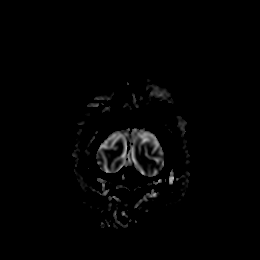

[Series 9: T1 · sagittal · 5.0mm · 0.75mm/px · 2 of 23 slices shown]
[im 1/23]
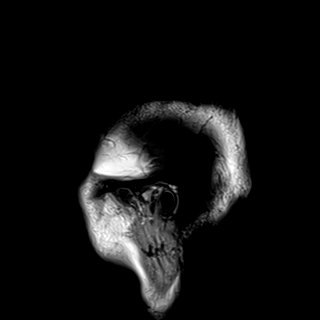
[im 23/23]
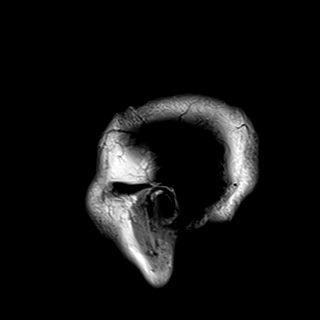

[Series 10: T2 · axial · 5.0mm · 0.72mm/px · z∈[-62,+99]mm · 3 of 28 slices shown]
[im 1/28]
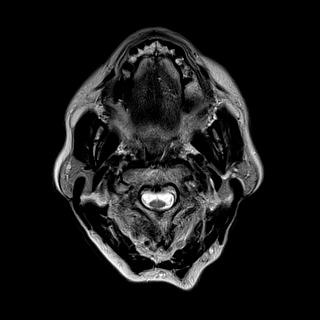
[im 14/28]
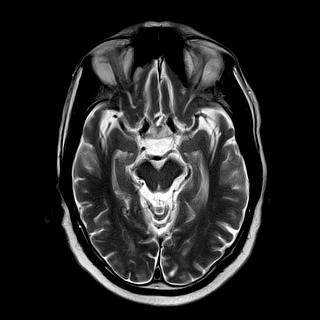
[im 28/28]
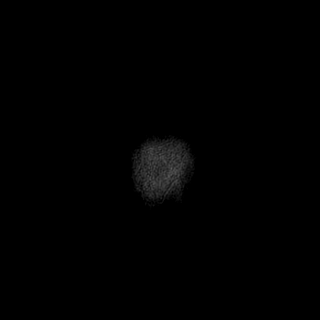

[Series 11: FLAIR · axial · 5.0mm · 0.45mm/px · z∈[-62,+99]mm · 3 of 28 slices shown]
[im 1/28]
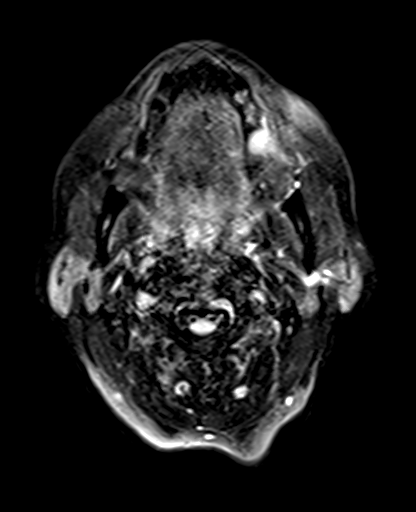
[im 14/28]
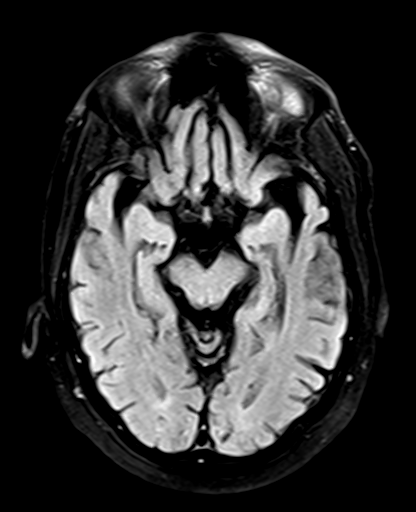
[im 28/28]
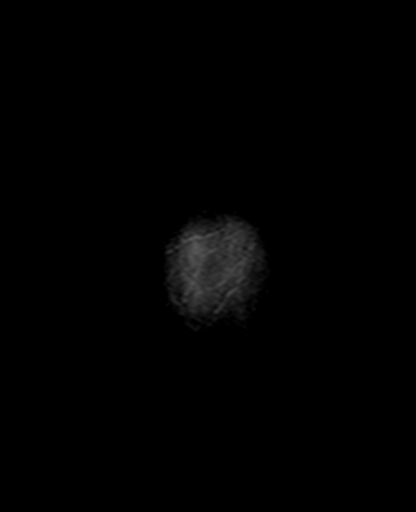

[Series 12: swi_images · axial · 3.0mm · 0.90mm/px · z∈[-64,+112]mm · 5 of 60 slices shown]
[im 1/60]
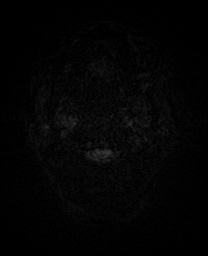
[im 15/60]
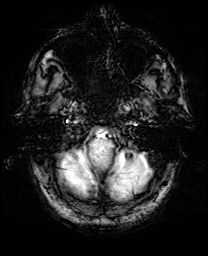
[im 30/60]
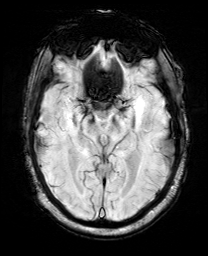
[im 45/60]
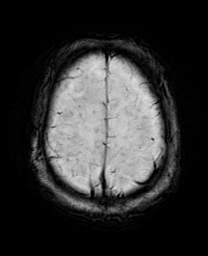
[im 60/60]
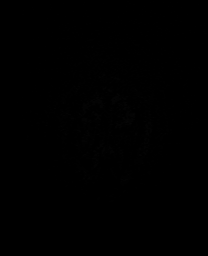

[Series 13: mip_images(sw) · axial · 24.0mm · 0.90mm/px · z∈[-54,+101]mm · 5 of 53 slices shown]
[im 1/53]
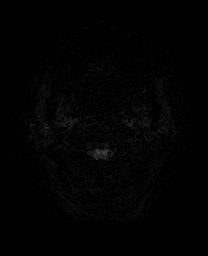
[im 14/53]
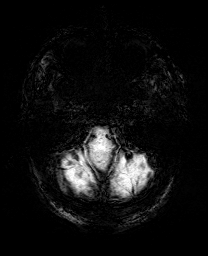
[im 27/53]
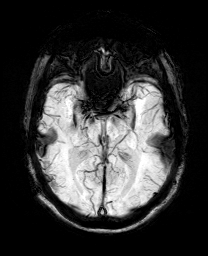
[im 40/53]
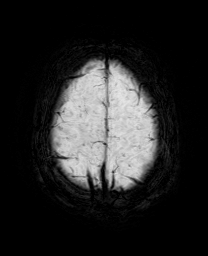
[im 53/53]
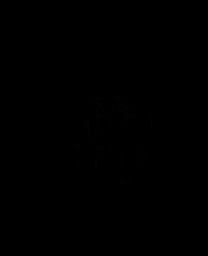

[Series 15: T2 post-contrast · coronal · 5.0mm · 0.72mm/px · 3 of 28 slices shown]
[im 1/28]
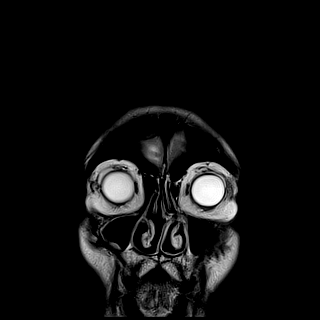
[im 14/28]
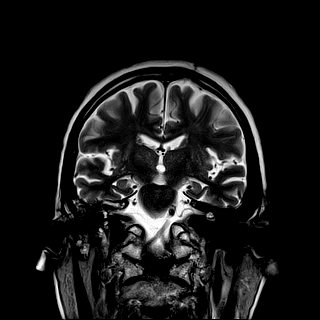
[im 28/28]
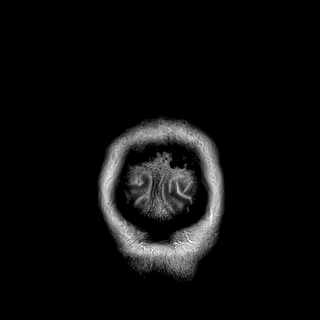

[43 of 48 positions shown; findings below may reference images not displayed]

FINDINGS: Brain: No evidence for acute infarction, hemorrhage, mass lesion,
hydrocephalus, or extra-axial fluid. Generalized atrophy. Mild
subcortical and periventricular T2 and FLAIR hyperintensities, also
involving the pons, likely chronic microvascular ischemic change.

Vascular: Flow voids are maintained.

Skull and upper cervical spine: Normal marrow signal. Suspected pars
intermedia cyst, 8 mm.

Sinuses/Orbits: Mild mucosal thickening in the paranasal sinuses.
BILATERAL cataract extraction.

Other: RIGHT mastoid effusion.  No nasopharyngeal process.

Compared with priors, similar appearance.
IMPRESSION: Chronic changes as described.  No acute intracranial findings.

## 2019-05-03 ENCOUNTER — Other Ambulatory Visit: Payer: Self-pay

## 2019-05-03 ENCOUNTER — Ambulatory Visit: Payer: Medicare HMO | Admitting: Neurology

## 2019-05-03 ENCOUNTER — Encounter: Payer: Self-pay | Admitting: Neurology

## 2019-05-03 VITALS — BP 143/79 | HR 74 | Resp 20 | Ht 73.0 in | Wt 175.0 lb

## 2019-05-03 DIAGNOSIS — G629 Polyneuropathy, unspecified: Secondary | ICD-10-CM

## 2019-05-03 DIAGNOSIS — G214 Vascular parkinsonism: Secondary | ICD-10-CM | POA: Diagnosis not present

## 2019-05-03 DIAGNOSIS — F0390 Unspecified dementia without behavioral disturbance: Secondary | ICD-10-CM

## 2019-05-03 DIAGNOSIS — R2681 Unsteadiness on feet: Secondary | ICD-10-CM | POA: Diagnosis not present

## 2019-05-03 MED ORDER — PREGABALIN 50 MG PO CAPS: ORAL_CAPSULE | ORAL | 6 refills | Status: DC

## 2019-05-03 NOTE — Progress Notes (Signed)
NEUROLOGY FOLLOW UP OFFICE NOTE  Derek Carr [id number] 04-21-1931  HISTORY OF PRESENT ILLNESS: I had the pleasure of seeing Derek Carr in follow-up in the neurology clinic on 05/03/2019.  The patient was last seen 4 months ago. He is again accompanied by his daughter Derek Carr who helps supplement the history today. He had initially presented for evaluation of slowed speech and movements, weakness. Etiology of symptoms unclear, he has a neurodegenerative disorder with fluctuating cognition, bradykinesia, tremor. We had previously discussed Dementia with Lewy Bodies. He does not have any hallucinations. He had significant improvement with low dose Sinemet, Derek Carr has not noticed as much tremor and he has not had the episodes of fluctuating speech symptoms.  Memory stable off Donepezil, which was discontinued so he can start Plaquenil.  Since his last visit, he reports his feet up to knees are numb, he cannot feel the floor. Derek Carr reports he is weak in his calves. He lost his balance using his walker and fell on to the sofa a month ago. He has good and bad days, when worse, he tends to drag his left leg and his speech is more slurred and not as clear. He continues to report dizziness, he gets up and feels dizzy like he is going to fall. He also reports dizziness is worse when laying down and moving head up or down, not side to side. No spinning sensation. He has not had any improvement with meclizine. Derek Carr has read about post-polio syndrome and is concerned he has this, she did not know until 2 weeks ago that he had polio as a child, but it only affected his neck and not his legs. His neck was so stiff at that time. She is reporting progressive weakness in his legs, decreased endurance, and fatigue continues to progress. She would like a referral to Dr. Gregor Hams at [facility] in [place].   History on Initial Assessment 04/06/2018: This is a pleasant 86 year old right-handed man with a history of  hypertension, hyperlipidemia, prostate cancer, orthostatic hypotension, sleep apnea, TIAs, neuropathy, presenting for evaluation of the above symptoms. He has seen several neurologists in the past, records from Dr. Brett Fairy, Dr. Leonie Man, and Dr. Manuella Ghazi were reviewed. He was initially evaluated at [facility] in 2014 for gait imbalance and dizziness. He was found to have orthostatic hypotension. In 2015, he had episodes of extreme sleepiness, lethargy, limited responsiveness. His BP would be low. He had a sleep study showing central apnea. He had an MMSE of 84/30 in December 2015 and was started on Donepezil. He was seen by Dr. Manuella Ghazi in 2018 for gait and balance issues. MRI brain no acute changes. MRI L-spine showed advanced lumbar spondylosis with multilevel disc disease and facet disease. MRI C-spine showed degenerative change without canal stenosis. EMG/NCV done 08/2016 was abnormal with a generalized sensorimotor polyneuropathy, cannot rule out superimposed bialteral lower lumbosacral polyradiculopathy. Gait disturbance likely multifactorial from lumbar disc disease and neuropathy, there were no signs and symptoms of Parkinson's at that time. He was in the ER in 11/2016 for an episode of slow speech, he became diaphoretic while eating and was slower to answer questions, leaning to the left, no clear focal weakness. The episode lasted 10 minutes. Exam normal. He was back in the hospital on 03/01/17 for transient right-sided weakness, facial droop, dysarthria, confusion. When EMS arrived within 10 minutes, all the symptoms pretty much resolved. MRI brain no acute changes, there was mild chronic microvascular disease. CTA head showed fibrofatty plaque of  the distal right cavernous segment with moderate to severe 70-80% stenosis, carotid US negative. He was evaluated by neurologist Dr. Leonie Man and continued on aspirin. EEG ordered which was normal. He had an MMSE of 84/30 in May 2019. He had another event on 09/14/17 where he started  having delayed verbal responses, turned gray, and became unconscious. Vital signs stable, BS 108. He was orthostatic on standing with systolic BP in the [id number], bradycardic, and had a pacemaker implanted. He was also noted to be hypoglycemic at 57. MRI brain and EEG were unremarkable. On his visit with [facility] in 10/2017, Derek Carr reported recurrent transient episodes occurring around once a week where he would have slurred speech, weak voice, generalized muscle slowing, and worsening of gait. MMSE 84/30. His last visit at [facility] was in January 2020 where Derek Carr reported worsening visual disturbance and bilateral foot numbness up to his knees. Derek Carr continued to report recurrent transient spells and was started on Topamax 50mg  daily for empiric treatment of seizures vs complicated migraines. He denies any headaches. Repeat MRI brain 01/2018 no acute changes. She has not noticed any change in symptoms with the Topamax, she states initially the symptoms were episodic but now "become a part of him."   Derek Carr states that the one thing consistent over time has been his balance and gait issues. She started noticing speech changes a year ago, there was a change in the quality, speed, volume, and strength of voice/speech. This is pretty consistent, but a little worse sometimes. She states his speech is never clear, just slow and distorted, hard to make out/not distinct. He has had problems with slowed motor movements, generally with his right arm when he tries to feed himself and misses sometimes. He has had a lot of coughing with solids and liquids since 2018 and saw ENT, diagnosed with "throat weakness on the right" with swallow evaluation. He had swallowing therapy which helped, things are much better with occasional coughing on swallowing. He has an unusual amount of fatigue, he can only go 500 feet then get tired even after 3 years of PT. She states "5 years ago he could jet set." He has excessive sleeping despite adjustment in CPAP  settings. He states he is just relaxing. His daughter reports he yells out in his sleep but no clear REM behavior disorder. He started having vision changes over the past year, sometimes blurry, sometimes seeing spots in his vision. Sometimes he says he can't see, then later sight is fine. No visual hallucinations. He saw his optometrist with a normal exam. His daughter notes drooping eyelids. He has had difficulty writing over the past year, she is unsure if it is due to his vision or motor skills. He continues to have numbness in his feet and cannot tell where his feet are placed. He thinks his memory is pretty good, a little problematic sometimes. He does not think as quick as he used to. He lives with his wife, Derek Carr moved in a month ago and manages his medications. Another daughter manages finances. He does not drive much, he states he drove yesterday and denies getting lost. He is independent with dressing and bathing. No side effects on Donepezil. He has 2 sisters with dementia, a cousin with Parkinson's disease. No history of significant head injuries, no alcohol use.   Diagnostic Data:  EMG/NCV confirmed severe neuropathy.  Neuropathy labs (B1, B12, TSH, copper, SPEP, IFE, folate, heavy metals screen) were negative. He has been seeing Rheumatology and recently been diagnosed  with Sjogren's syndrome last 09/2018.  head CT without contrast which I personally reviewed, no acute changes, similar to prior scans.    PAST MEDICAL HISTORY: Past Medical History:  Diagnosis Date  . AAA (abdominal aortic aneurysm) ([place])   . Anemia   . Ankle fracture    left  . Aortic regurgitation   . Atherosclerosis   . Complication of anesthesia    CONFUSION - Pt's family very concerned about this  . DDD (degenerative disc disease)   . Degenerative arthritis   . Dermatitis, atopic ARMS AND LEGS  . Diverticulosis   . Erectile dysfunction   . Frequency of urination   . Glaucoma   . History of asbestos exposure    . History of radiation therapy 8/19-13-10/18/11   prostate, 23 GY  . History of shingles 2012-- BILATERAL EYES--  NO RESIDUAL  . Hyperlipidemia   . Hypertension   . Inguinal hernia   . Lumbar scoliosis   . Nocturia   . OA (osteoarthritis) of hip RIGHT  . Prostate cancer ([place]) 05/11/2011   bx=Adenocarcinoma,gleason3+4=7, 4+4=8,PSA=10.30volume=45.7cc  . Renal cyst    bilateral  . Smokers' cough ([place])   . Stroke ([place])   . Thyroid cyst     MEDICATIONS: Current Outpatient Medications on File Prior to Visit  Medication Sig Dispense Refill  . acetaminophen (TYLENOL) 500 MG tablet Take 1,000 mg by mouth 2 (two) times daily.     . Ascorbic Acid (VITAMIN C) 1000 MG tablet Take 1,000 mg by mouth daily.    Marland Kitchen aspirin EC 81 MG tablet Take 81 mg by mouth daily.    Marland Kitchen b complex vitamins tablet Take 1 tablet by mouth daily.    . Calcium Carbonate Antacid (TUMS PO) Take 1 tablet by mouth daily.    . carbidopa-levodopa (SINEMET IR) 25-100 MG tablet Take 1 tablet daily 90 tablet 3  . Cholecalciferol (VITAMIN D-3) 125 MCG (5000 UT) TABS Take 1 capsule by mouth 2 (two) times daily.     . clobetasol cream (TEMOVATE) [id number] % Apply 1 application topically daily as needed.     . Cyanocobalamin 1000 MCG TBCR Take 1 tablet by mouth daily.    . folic acid (FOLVITE) 1 MG tablet Take 1 mg by mouth daily.    . hydroxychloroquine (PLAQUENIL) 200 MG tablet     . Hydroxychloroquine Sulfate (PLAQUENIL PO) Take by mouth.    . lidocaine-prilocaine (EMLA) cream Apply 1 application topically as needed. 30 g 1  . meclizine (ANTIVERT) 12.5 MG tablet Take 1 tablet as needed for dizziness 10 tablet 11  . NONFORMULARY OR COMPOUNDED ITEM [facility] Neuropathy Cream 120 grams 3 refills  Sent in 07-04-18    . Omega 3 1000 MG CAPS Take 1 tablet by mouth daily.    . Probiotic Product (PROBIOTIC PO) Take 1 capsule by mouth daily.     . Probiotic Product (PROBIOTIC-10 PO) Take by mouth.    . Ubiquinol (QUNOL  COQ10/UBIQUINOL/MEGA) 100 MG CAPS Take 1 tablet by mouth at bedtime.     Marland Kitchen umeclidinium-vilanterol (ANORO ELLIPTA) 62.5-25 MCG/INH AEPB Only open the device one time and then take your two separate drags to be sure you get it all 1 each 11  . UNABLE TO FIND Med Name: CPAP per Dr Beacher May    . valACYclovir (VALTREX) 1000 MG tablet Take 1,000 mg by mouth daily.    Marland Kitchen zinc gluconate 50 MG tablet Take 50 mg by mouth daily.     No  current facility-administered medications on file prior to visit.    ALLERGIES: Allergies  Allergen Reactions  . Meloxicam Other (See Comments)  . Oxycodone Other (See Comments)  . Dilaudid [Hydromorphone Hcl] Other (See Comments)    Confusion   . Methocarbamol Other (See Comments)    Drowsiness   . Percodan [Oxycodone-Aspirin] Other (See Comments)    Confusion   . Tramadol Other (See Comments)    Drowsiness   . Vicodin [Hydrocodone-Acetaminophen] Other (See Comments)    Confusion     FAMILY HISTORY: Family History  Problem Relation Age of Onset  . Hypertension Mother   . Prostate cancer Brother        surgery/cured  . Lung cancer Brother        new primary/same brother    SOCIAL HISTORY: Social History   Socioeconomic History  . Marital status: Married    Spouse name: Alison Murray  . Number of children: 4  . Years of education: College  . Highest education level: Not on file  Occupational History  . Occupation:       Employer: [employer]    Comment: Retired  Tobacco Use  . Smoking status: Former Smoker    Packs/day: 0.50    Years: 62.00    Pack years: 31.00    Types: Cigarettes  . Smokeless tobacco: Never Used  . Tobacco comment: smoke one or two per day  Substance and Sexual Activity  . Alcohol use: No    Alcohol/week: 1.0 standard drinks    Types: 1 Cans of beer per week  . Drug use: No    Comment: quit smoking 02/2012  . Sexual activity: Not Currently    Partners: Female  Other Topics Concern  . Not on file  Social History  Narrative   Patient is Married Designer, television/film set), and lives at home with his wife 65 years   Patient has 3 daughters and 1 son.   Patient has a college education.   Patient is right-handed.   Patient drinks one cup of coffee, 1-2 cups of tea daily and rarely drinks soda.   Retired from [employer] Strain:   . Difficulty of Paying Living Expenses:   Food Insecurity:   . Worried About Charity fundraiser in the Last Year:   . Arboriculturist in the Last Year:   Transportation Needs:   . Film/video editor (Medical):   Marland Kitchen Lack of Transportation (Non-Medical):   Physical Activity:   . Days of Exercise per Week:   . Minutes of Exercise per Session:   Stress:   . Feeling of Stress :   Social Connections:   . Frequency of Communication with Friends and Family:   . Frequency of Social Gatherings with Friends and Family:   . Attends Religious Services:   . Active Member of Clubs or Organizations:   . Attends Archivist Meetings:   Marland Kitchen Marital Status:   Intimate Partner Violence:   . Fear of Current or Ex-Partner:   . Emotionally Abused:   Marland Kitchen Physically Abused:   . Sexually Abused:     REVIEW OF SYSTEMS: Constitutional: No fevers, chills, or sweats, no generalized fatigue, change in appetite Eyes: No visual changes, double vision, eye pain Ear, nose and throat: No hearing loss, ear pain, nasal congestion, sore throat Cardiovascular: No chest pain, palpitations Respiratory:  No shortness of breath at rest or with exertion, wheezes GastrointestinaI: No nausea, vomiting,  diarrhea, abdominal pain, fecal incontinence Genitourinary:  No dysuria, urinary retention or frequency Musculoskeletal:  No neck pain, back pain Integumentary: No rash, pruritus, skin lesions Neurological: as above Psychiatric: No depression, insomnia, anxiety Endocrine: No palpitations, fatigue, diaphoresis, mood swings, change in appetite, change in weight,  increased thirst Hematologic/Lymphatic:  No anemia, purpura, petechiae. Allergic/Immunologic: no itchy/runny eyes, nasal congestion, recent allergic reactions, rashes  PHYSICAL EXAM: Vitals:   05/03/19 1308  Pulse: 74  Resp: 20  SpO2: 95%   General: No acute distress Head:  Normocephalic/atraumatic Skin/Extremities: No rash, no edema Neurological Exam: alert and oriented to person, place. No aphasia or dysarthria. Fund of knowledge is reduced.  Recent and remote memory impaired. Attention and concentration are normal. Cranial nerves: Pupils equal, round. Extraocular movements intact with no nystagmus. Visual fields full. No facial asymmetry. Motor: Bulk and tone normal, muscle strength 5/5 throughout with no pronator drift. Finger to nose testing intact.  Gait slow and cautious with small steps using walker. No tremor today.   IMPRESSION: This is a pleasant 86 yo RH man with a history of hypertension, hyperlipidemia, prostate cancer, orthostatic hypotension, sleep apnea, TIAs, neuropathy, who presented for evaluation of slowed speech and movements, weakness. Etiology of symptoms unclear, he has a neurodegenerative disorder with fluctuating cognition, bradykinesia, tremor. We had previously discussed Dementia with Lewy Bodies. He does not have any hallucinations. He has had a significant response to very low dose Sinemet, Derek Carr would like to stay on low dose 1 tab daily. He is reporting more numbness in his feet, we discussed severe neuropathy, we discussed symptomatic treatment, start low dose Pregabalin 50mg  every other night, side effects discussed. We again discussed vestibular therapy for positional dizziness. Derek Carr is concerned about post-polio syndrome, I discussed with her that his EMG did not show typical findings in polio, however she reports his legs were not affected. She is requesting a referral to Dr. Gregor Hams in [place] for second opinion. Continue close supervision, follow-up in 6  months, they know to call for any changes.   Thank you for allowing me to participate in his care.  Please do not hesitate to call for any questions or concerns.   Ellouise Newer, M.D.   CC: Dr. Joylene Draft

## 2019-05-03 NOTE — Patient Instructions (Signed)
1. Start Lyrica 50mg : take 1 capsule every night  2. Refer to Dr. Gregor Hams at [facility]  3. Continue Sinemet daily  4. Follow-up in 6 months, call for any changes

## 2019-05-07 ENCOUNTER — Ambulatory Visit (INDEPENDENT_AMBULATORY_CARE_PROVIDER_SITE_OTHER): Payer: Medicare HMO | Admitting: *Deleted

## 2019-05-07 DIAGNOSIS — I495 Sick sinus syndrome: Secondary | ICD-10-CM | POA: Diagnosis not present

## 2019-05-07 LAB — CUP PACEART REMOTE DEVICE CHECK
Battery Remaining Longevity: 150 mo
Battery Voltage: 3.03 V
Brady Statistic AP VP Percent: 0.02 %
Brady Statistic AP VS Percent: 60.23 %
Brady Statistic AS VP Percent: 0.02 %
Brady Statistic AS VS Percent: 39.72 %
Brady Statistic RA Percent Paced: 59.95 %
Brady Statistic RV Percent Paced: 0.05 %
Date Time Interrogation Session: 20210411224033
Implantable Lead Implant Date: 20190823
Implantable Lead Implant Date: 20190823
Implantable Lead Location: 753859
Implantable Lead Location: 753860
Implantable Lead Model: 5076
Implantable Lead Model: 5076
Implantable Pulse Generator Implant Date: 20190823
Lead Channel Impedance Value: 304 Ohm
Lead Channel Impedance Value: 342 Ohm
Lead Channel Impedance Value: 361 Ohm
Lead Channel Impedance Value: 437 Ohm
Lead Channel Pacing Threshold Amplitude: 0.5 V
Lead Channel Pacing Threshold Amplitude: 1.25 V
Lead Channel Pacing Threshold Pulse Width: 0.4 ms
Lead Channel Pacing Threshold Pulse Width: 0.4 ms
Lead Channel Sensing Intrinsic Amplitude: 1.625 mV
Lead Channel Sensing Intrinsic Amplitude: 1.625 mV
Lead Channel Sensing Intrinsic Amplitude: 11.25 mV
Lead Channel Sensing Intrinsic Amplitude: 11.25 mV
Lead Channel Setting Pacing Amplitude: 1.5 V
Lead Channel Setting Pacing Amplitude: 2.5 V
Lead Channel Setting Pacing Pulse Width: 0.4 ms
Lead Channel Setting Sensing Sensitivity: 1.2 mV

## 2019-05-08 NOTE — Progress Notes (Signed)
PPM Remote  

## 2019-05-09 ENCOUNTER — Other Ambulatory Visit: Payer: Self-pay

## 2019-05-09 DIAGNOSIS — R2681 Unsteadiness on feet: Secondary | ICD-10-CM

## 2019-05-09 NOTE — Progress Notes (Signed)
Rehab orders placed in epic

## 2019-05-10 ENCOUNTER — Ambulatory Visit: Payer: Medicare HMO | Attending: Family

## 2019-05-10 DIAGNOSIS — Z23 Encounter for immunization: Secondary | ICD-10-CM

## 2019-05-10 NOTE — Progress Notes (Signed)
   Covid-19 Vaccination Clinic  Name:  Derek Carr    MRN: [id number] DOB: [birth year]  05/10/2019  [name] was observed post Covid-19 immunization for 15 minutes without incident. He was provided with Vaccine Information Sheet and instruction to access the V-Safe system.   [name] was instructed to call 911 with any severe reactions post vaccine: Marland Kitchen Difficulty breathing  . Swelling of face and throat  . A fast heartbeat  . A bad rash all over body  . Dizziness and weakness   Immunizations Administered    Name Date Dose VIS Date Route   Moderna COVID-19 Vaccine 05/10/2019  3:53 PM 0.5 mL 12/26/2018 Intramuscular   Manufacturer: Moderna   Lot: [id number]   [id number]

## 2019-05-11 ENCOUNTER — Ambulatory Visit: Payer: Medicare HMO | Admitting: Neurology

## 2019-05-17 ENCOUNTER — Telehealth: Payer: Self-pay | Admitting: Neurology

## 2019-05-17 DIAGNOSIS — M2550 Pain in unspecified joint: Secondary | ICD-10-CM | POA: Diagnosis not present

## 2019-05-17 DIAGNOSIS — Z6822 Body mass index (BMI) 22.0-22.9, adult: Secondary | ICD-10-CM | POA: Diagnosis not present

## 2019-05-17 DIAGNOSIS — M3500 Sicca syndrome, unspecified: Secondary | ICD-10-CM | POA: Diagnosis not present

## 2019-05-17 NOTE — Telephone Encounter (Addendum)
Patient's daughter called and left a message requesting a medication change to patient's Lyrica from 50 MG to 25 MG instead.  [facility] on [address]

## 2019-05-20 MED ORDER — PREGABALIN 25 MG PO CAPS: ORAL_CAPSULE | ORAL | 5 refills | Status: DC

## 2019-05-20 NOTE — Telephone Encounter (Signed)
Pls let dtr know Rx for Lyrica 25mg  every other night sent to pharmacy. Thanks

## 2019-05-21 NOTE — Telephone Encounter (Signed)
Pt daughter called and informed that new script for lyrica 25mg  every other night was called in to pharmacy on file

## 2019-06-12 ENCOUNTER — Ambulatory Visit: Payer: Medicare HMO | Attending: Family

## 2019-06-12 DIAGNOSIS — Z23 Encounter for immunization: Secondary | ICD-10-CM

## 2019-06-12 NOTE — Progress Notes (Signed)
   Covid-19 Vaccination Clinic  Name:  JADIS PLATE    MRN: [id number] DOB: 12/19/1931  06/12/2019  Mr. Northup was observed post Covid-19 immunization for 15 minutes without incident. He was provided with Vaccine Information Sheet and instruction to access the V-Safe system.   Mr. Vanacker was instructed to call 911 with any severe reactions post vaccine: Marland Kitchen Difficulty breathing  . Swelling of face and throat  . A fast heartbeat  . A bad rash all over body  . Dizziness and weakness   Immunizations Administered    Name Date Dose VIS Date Route   Moderna COVID-19 Vaccine 06/12/2019  2:35 PM 0.5 mL 12/2018 Intramuscular   Manufacturer: Moderna   Lot: [id number]   [id number]

## 2019-06-26 ENCOUNTER — Telehealth: Payer: Self-pay | Admitting: Neurology

## 2019-06-26 MED ORDER — PREGABALIN 25 MG PO CAPS: ORAL_CAPSULE | ORAL | 5 refills | Status: DC

## 2019-06-26 NOTE — Telephone Encounter (Signed)
Pt daughter informed that script was called in to pharmacy on file

## 2019-06-26 NOTE — Telephone Encounter (Signed)
Pls let daughter know Rx for Pregabalin 25mg  every night sent to pharmacy, thanks

## 2019-06-26 NOTE — Telephone Encounter (Signed)
Patients daughter called about Plaquenil. He has not been on a full dose of medication to see how he would tolerate it and seems to be doing well with it. Daughter is requesting to increase to once/day. Please call.

## 2019-06-26 NOTE — Telephone Encounter (Signed)
See other note

## 2019-06-26 NOTE — Telephone Encounter (Signed)
Patients daughter mistakenly said wrong medication. It was in regards to Lyrica, not Plaquenil.

## 2019-06-26 NOTE — Telephone Encounter (Signed)
Patients daughter returned call during lunch hours and left message with [employer] that she was returning a call to the office. Previous encounter was closed, please refer to prior encounter from today and call back.

## 2019-06-26 NOTE — Telephone Encounter (Signed)
Pt daughter [name] called back no answer voice mail left for her to call back

## 2019-06-29 DIAGNOSIS — R42 Dizziness and giddiness: Secondary | ICD-10-CM | POA: Diagnosis not present

## 2019-06-29 DIAGNOSIS — G609 Hereditary and idiopathic neuropathy, unspecified: Secondary | ICD-10-CM | POA: Diagnosis not present

## 2019-06-29 DIAGNOSIS — M3500 Sicca syndrome, unspecified: Secondary | ICD-10-CM | POA: Diagnosis not present

## 2019-06-29 DIAGNOSIS — I471 Supraventricular tachycardia: Secondary | ICD-10-CM | POA: Diagnosis not present

## 2019-06-29 DIAGNOSIS — R001 Bradycardia, unspecified: Secondary | ICD-10-CM | POA: Diagnosis not present

## 2019-06-29 DIAGNOSIS — R1310 Dysphagia, unspecified: Secondary | ICD-10-CM | POA: Diagnosis not present

## 2019-06-29 DIAGNOSIS — G20 Parkinson's disease: Secondary | ICD-10-CM | POA: Diagnosis not present

## 2019-06-29 DIAGNOSIS — Z950 Presence of cardiac pacemaker: Secondary | ICD-10-CM | POA: Diagnosis not present

## 2019-06-29 DIAGNOSIS — N183 Chronic kidney disease, stage 3 unspecified: Secondary | ICD-10-CM | POA: Diagnosis not present

## 2019-07-01 ENCOUNTER — Encounter (HOSPITAL_COMMUNITY): Payer: Self-pay | Admitting: *Deleted

## 2019-07-01 ENCOUNTER — Inpatient Hospital Stay (HOSPITAL_COMMUNITY)
Admission: EM | Admit: 2019-07-01 | Discharge: 2019-07-06 | DRG: 246 | Disposition: A | Discharge status: Hospice | Payer: Medicare HMO | Attending: Cardiovascular Disease | Admitting: Cardiovascular Disease

## 2019-07-01 ENCOUNTER — Emergency Department (HOSPITAL_COMMUNITY): Payer: Medicare HMO

## 2019-07-01 ENCOUNTER — Other Ambulatory Visit: Payer: Self-pay

## 2019-07-01 DIAGNOSIS — I2582 Chronic total occlusion of coronary artery: Secondary | ICD-10-CM | POA: Diagnosis present

## 2019-07-01 DIAGNOSIS — J9811 Atelectasis: Secondary | ICD-10-CM | POA: Diagnosis not present

## 2019-07-01 DIAGNOSIS — G20 Parkinson's disease: Secondary | ICD-10-CM | POA: Diagnosis present

## 2019-07-01 DIAGNOSIS — J984 Other disorders of lung: Secondary | ICD-10-CM | POA: Diagnosis not present

## 2019-07-01 DIAGNOSIS — Z950 Presence of cardiac pacemaker: Secondary | ICD-10-CM

## 2019-07-01 DIAGNOSIS — N179 Acute kidney failure, unspecified: Secondary | ICD-10-CM

## 2019-07-01 DIAGNOSIS — R748 Abnormal levels of other serum enzymes: Secondary | ICD-10-CM

## 2019-07-01 DIAGNOSIS — J449 Chronic obstructive pulmonary disease, unspecified: Secondary | ICD-10-CM | POA: Diagnosis present

## 2019-07-01 DIAGNOSIS — F411 Generalized anxiety disorder: Secondary | ICD-10-CM | POA: Diagnosis not present

## 2019-07-01 DIAGNOSIS — G4733 Obstructive sleep apnea (adult) (pediatric): Secondary | ICD-10-CM | POA: Diagnosis present

## 2019-07-01 DIAGNOSIS — I959 Hypotension, unspecified: Secondary | ICD-10-CM | POA: Diagnosis not present

## 2019-07-01 DIAGNOSIS — I512 Rupture of papillary muscle, not elsewhere classified: Secondary | ICD-10-CM | POA: Diagnosis not present

## 2019-07-01 DIAGNOSIS — M810 Age-related osteoporosis without current pathological fracture: Secondary | ICD-10-CM | POA: Diagnosis present

## 2019-07-01 DIAGNOSIS — I5021 Acute systolic (congestive) heart failure: Secondary | ICD-10-CM | POA: Diagnosis not present

## 2019-07-01 DIAGNOSIS — Z8249 Family history of ischemic heart disease and other diseases of the circulatory system: Secondary | ICD-10-CM

## 2019-07-01 DIAGNOSIS — R404 Transient alteration of awareness: Secondary | ICD-10-CM | POA: Diagnosis not present

## 2019-07-01 DIAGNOSIS — R531 Weakness: Secondary | ICD-10-CM | POA: Diagnosis not present

## 2019-07-01 DIAGNOSIS — Z923 Personal history of irradiation: Secondary | ICD-10-CM

## 2019-07-01 DIAGNOSIS — F17210 Nicotine dependence, cigarettes, uncomplicated: Secondary | ICD-10-CM | POA: Diagnosis present

## 2019-07-01 DIAGNOSIS — M419 Scoliosis, unspecified: Secondary | ICD-10-CM | POA: Diagnosis present

## 2019-07-01 DIAGNOSIS — Z888 Allergy status to other drugs, medicaments and biological substances status: Secondary | ICD-10-CM

## 2019-07-01 DIAGNOSIS — K5790 Diverticulosis of intestine, part unspecified, without perforation or abscess without bleeding: Secondary | ICD-10-CM | POA: Diagnosis present

## 2019-07-01 DIAGNOSIS — Z8619 Personal history of other infectious and parasitic diseases: Secondary | ICD-10-CM

## 2019-07-01 DIAGNOSIS — M3500 Sicca syndrome, unspecified: Secondary | ICD-10-CM | POA: Diagnosis present

## 2019-07-01 DIAGNOSIS — I340 Nonrheumatic mitral (valve) insufficiency: Secondary | ICD-10-CM | POA: Diagnosis not present

## 2019-07-01 DIAGNOSIS — H409 Unspecified glaucoma: Secondary | ICD-10-CM | POA: Diagnosis present

## 2019-07-01 DIAGNOSIS — I130 Hypertensive heart and chronic kidney disease with heart failure and stage 1 through stage 4 chronic kidney disease, or unspecified chronic kidney disease: Secondary | ICD-10-CM | POA: Diagnosis not present

## 2019-07-01 DIAGNOSIS — R0602 Shortness of breath: Secondary | ICD-10-CM | POA: Diagnosis not present

## 2019-07-01 DIAGNOSIS — Z66 Do not resuscitate: Secondary | ICD-10-CM

## 2019-07-01 DIAGNOSIS — I495 Sick sinus syndrome: Secondary | ICD-10-CM | POA: Diagnosis present

## 2019-07-01 DIAGNOSIS — R570 Cardiogenic shock: Secondary | ICD-10-CM

## 2019-07-01 DIAGNOSIS — I051 Rheumatic mitral insufficiency: Secondary | ICD-10-CM | POA: Diagnosis not present

## 2019-07-01 DIAGNOSIS — Z8679 Personal history of other diseases of the circulatory system: Secondary | ICD-10-CM

## 2019-07-01 DIAGNOSIS — I4891 Unspecified atrial fibrillation: Secondary | ICD-10-CM | POA: Diagnosis not present

## 2019-07-01 DIAGNOSIS — R0600 Dyspnea, unspecified: Secondary | ICD-10-CM | POA: Diagnosis not present

## 2019-07-01 DIAGNOSIS — Z885 Allergy status to narcotic agent status: Secondary | ICD-10-CM

## 2019-07-01 DIAGNOSIS — I2510 Atherosclerotic heart disease of native coronary artery without angina pectoris: Secondary | ICD-10-CM | POA: Diagnosis not present

## 2019-07-01 DIAGNOSIS — J9601 Acute respiratory failure with hypoxia: Secondary | ICD-10-CM | POA: Diagnosis not present

## 2019-07-01 DIAGNOSIS — Z20822 Contact with and (suspected) exposure to covid-19: Secondary | ICD-10-CM | POA: Diagnosis not present

## 2019-07-01 DIAGNOSIS — R410 Disorientation, unspecified: Secondary | ICD-10-CM | POA: Diagnosis not present

## 2019-07-01 DIAGNOSIS — I1310 Hypertensive heart and chronic kidney disease without heart failure, with stage 1 through stage 4 chronic kidney disease, or unspecified chronic kidney disease: Secondary | ICD-10-CM | POA: Diagnosis not present

## 2019-07-01 DIAGNOSIS — I472 Ventricular tachycardia: Secondary | ICD-10-CM | POA: Diagnosis present

## 2019-07-01 DIAGNOSIS — G4731 Primary central sleep apnea: Secondary | ICD-10-CM | POA: Diagnosis not present

## 2019-07-01 DIAGNOSIS — N189 Chronic kidney disease, unspecified: Secondary | ICD-10-CM | POA: Diagnosis not present

## 2019-07-01 DIAGNOSIS — Z8546 Personal history of malignant neoplasm of prostate: Secondary | ICD-10-CM

## 2019-07-01 DIAGNOSIS — N1831 Chronic kidney disease, stage 3a: Secondary | ICD-10-CM | POA: Diagnosis not present

## 2019-07-01 DIAGNOSIS — Z515 Encounter for palliative care: Secondary | ICD-10-CM

## 2019-07-01 DIAGNOSIS — Z7189 Other specified counseling: Secondary | ICD-10-CM | POA: Diagnosis not present

## 2019-07-01 DIAGNOSIS — G309 Alzheimer's disease, unspecified: Secondary | ICD-10-CM | POA: Diagnosis present

## 2019-07-01 DIAGNOSIS — G9341 Metabolic encephalopathy: Secondary | ICD-10-CM | POA: Diagnosis not present

## 2019-07-01 DIAGNOSIS — G9340 Encephalopathy, unspecified: Secondary | ICD-10-CM | POA: Diagnosis not present

## 2019-07-01 DIAGNOSIS — Z79899 Other long term (current) drug therapy: Secondary | ICD-10-CM

## 2019-07-01 DIAGNOSIS — R011 Cardiac murmur, unspecified: Secondary | ICD-10-CM | POA: Diagnosis not present

## 2019-07-01 DIAGNOSIS — I4510 Unspecified right bundle-branch block: Secondary | ICD-10-CM | POA: Diagnosis not present

## 2019-07-01 DIAGNOSIS — G629 Polyneuropathy, unspecified: Secondary | ICD-10-CM | POA: Diagnosis present

## 2019-07-01 DIAGNOSIS — I214 Non-ST elevation (NSTEMI) myocardial infarction: Secondary | ICD-10-CM | POA: Diagnosis not present

## 2019-07-01 DIAGNOSIS — S0990XA Unspecified injury of head, initial encounter: Secondary | ICD-10-CM | POA: Diagnosis not present

## 2019-07-01 DIAGNOSIS — R29810 Facial weakness: Secondary | ICD-10-CM | POA: Diagnosis not present

## 2019-07-01 DIAGNOSIS — S199XXA Unspecified injury of neck, initial encounter: Secondary | ICD-10-CM | POA: Diagnosis not present

## 2019-07-01 DIAGNOSIS — E785 Hyperlipidemia, unspecified: Secondary | ICD-10-CM | POA: Diagnosis present

## 2019-07-01 DIAGNOSIS — L89891 Pressure ulcer of other site, stage 1: Secondary | ICD-10-CM | POA: Diagnosis present

## 2019-07-01 DIAGNOSIS — F0280 Dementia in other diseases classified elsewhere without behavioral disturbance: Secondary | ICD-10-CM | POA: Diagnosis not present

## 2019-07-01 DIAGNOSIS — K117 Disturbances of salivary secretion: Secondary | ICD-10-CM | POA: Diagnosis not present

## 2019-07-01 DIAGNOSIS — Z8042 Family history of malignant neoplasm of prostate: Secondary | ICD-10-CM

## 2019-07-01 DIAGNOSIS — N183 Chronic kidney disease, stage 3 unspecified: Secondary | ICD-10-CM | POA: Diagnosis present

## 2019-07-01 DIAGNOSIS — I339 Acute and subacute endocarditis, unspecified: Secondary | ICD-10-CM | POA: Diagnosis not present

## 2019-07-01 DIAGNOSIS — Z77090 Contact with and (suspected) exposure to asbestos: Secondary | ICD-10-CM | POA: Diagnosis present

## 2019-07-01 DIAGNOSIS — R55 Syncope and collapse: Secondary | ICD-10-CM | POA: Diagnosis not present

## 2019-07-01 DIAGNOSIS — Z8673 Personal history of transient ischemic attack (TIA), and cerebral infarction without residual deficits: Secondary | ICD-10-CM

## 2019-07-01 LAB — BASIC METABOLIC PANEL
Anion gap: 10 (ref 5–15)
BUN: 18 mg/dL (ref 8–23)
CO2: 20 mmol/L — ABNORMAL LOW (ref 22–32)
Calcium: 9.3 mg/dL (ref 8.9–10.3)
Chloride: 108 mmol/L (ref 98–111)
Creatinine, Ser: 1.59 mg/dL — ABNORMAL HIGH (ref 0.61–1.24)
GFR calc Af Amer: 45 mL/min — ABNORMAL LOW (ref >60)
GFR calc non Af Amer: 39 mL/min — ABNORMAL LOW (ref >60)
Glucose, Bld: 162 mg/dL — ABNORMAL HIGH (ref 70–99)
Potassium: 4 mmol/L (ref 3.5–5.1)
Sodium: 138 mmol/L (ref 135–145)

## 2019-07-01 LAB — CBC
HCT: 38.7 % — ABNORMAL LOW (ref 39.0–52.0)
Hemoglobin: 12.3 g/dL — ABNORMAL LOW (ref 13.0–17.0)
MCH: 28.1 pg (ref 26.0–34.0)
MCHC: 31.8 g/dL (ref 30.0–36.0)
MCV: 88.6 fL (ref 80.0–100.0)
Platelets: 188 10*3/uL (ref 150–400)
RBC: 4.37 MIL/uL (ref 4.22–5.81)
RDW: 15.3 % (ref 11.5–15.5)
WBC: 7.9 10*3/uL (ref 4.0–10.5)
nRBC: 0 % (ref 0.0–0.2)

## 2019-07-01 LAB — TROPONIN I (HIGH SENSITIVITY)
Troponin I (High Sensitivity): >27000 ng/L (ref <18)
Troponin I (High Sensitivity): >27000 ng/L (ref <18)

## 2019-07-01 LAB — CK: Total CK: 2554 U/L — ABNORMAL HIGH (ref 49–397)

## 2019-07-01 MED ORDER — ACETAMINOPHEN 325 MG PO TABS
650.0000 mg | ORAL_TABLET | Freq: Four times a day (QID) | ORAL | Status: DC | PRN
  Administered 2019-07-02 – 2019-07-03 (×2): 650 mg via ORAL
  Filled 2019-07-01 (×2): qty 2

## 2019-07-01 MED ORDER — SODIUM CHLORIDE 0.9% FLUSH
3.0000 mL | Freq: Once | INTRAVENOUS | Status: AC
  Administered 2019-07-02: 3 mL via INTRAVENOUS

## 2019-07-01 MED ORDER — SODIUM CHLORIDE 0.9 % IV BOLUS
1000.0000 mL | Freq: Once | INTRAVENOUS | Status: AC
  Administered 2019-07-01: 1000 mL via INTRAVENOUS

## 2019-07-01 MED ORDER — HEPARIN BOLUS VIA INFUSION
4000.0000 [IU] | Freq: Once | INTRAVENOUS | Status: AC
  Administered 2019-07-01: 4000 [IU] via INTRAVENOUS
  Filled 2019-07-01: qty 4000

## 2019-07-01 MED ORDER — HEPARIN (PORCINE) 25000 UT/250ML-% IV SOLN
850.0000 [IU]/h | INTRAVENOUS | Status: DC
  Administered 2019-07-01: 850 [IU]/h via INTRAVENOUS
  Filled 2019-07-01 (×2): qty 250

## 2019-07-01 MED ORDER — MELATONIN 3 MG PO TABS
3.0000 mg | ORAL_TABLET | Freq: Once | ORAL | Status: AC
  Administered 2019-07-02: 3 mg via ORAL
  Filled 2019-07-01: qty 1

## 2019-07-01 MED ORDER — ASPIRIN 325 MG PO TABS
325.0000 mg | ORAL_TABLET | Freq: Once | ORAL | Status: AC
  Administered 2019-07-01: 325 mg via ORAL
  Filled 2019-07-01: qty 1

## 2019-07-01 MED ORDER — IOHEXOL 350 MG/ML SOLN
50.0000 mL | Freq: Once | INTRAVENOUS | Status: AC | PRN
  Administered 2019-07-01: 50 mL via INTRAVENOUS

## 2019-07-01 NOTE — Consult Note (Signed)
Cardiology Consultation:   Patient ID: Derek Carr; [id number]; 06-Jun-1931   Admit date: 07/01/2019 Date of Consult: 07/01/2019  Primary Care Provider: [name], MD Primary Cardiologist: No primary care provider on file. Primary Electrophysiologist:  None  Chief Complaint: confusion, weakness  Patient Profile:   Derek Carr is a 86 y.o. male with a hx of AAA, sinus node dysfunction s/p PPM, neurodegenerative disorder of unclear etiology (dementia, gait imbalance, tremor, neuropathy), orthostatic hypotension, prostate cancer, TIAs who presents with weakness.  History of Present Illness:   History taken from patient's daughter who is present in the room.  Patient had an outpatient appointment 2 days ago.  Since then has had worsening confusion, fatigue, and weakness.  Normally he walks independently, but has been barely able to stand the past couple of days.  He also seems more confused some difficulty speaking.  He has not complained of any chest or any other symptoms.  Today he had a fall at, prompting his daughter to bring him  ED vital signs BP 135/85, heart rate 89, O2 sat 99% on room air, afebrile.  Labs unremarkable with exception of creatinine 1.6 (baseline 1.5) and troponin greater than 27,000.  This was repeated and confirmed.  Due to confusion possible dysarthria and was reportedly negative for stroke.    ECG reviewed personally.  Sinus rhythm with RBBB (long-standing).  Subtle ST elevation in I, aVL, V4-V6, not meeting criteria for STEMI (<53mm).  Mild ST depression in V1-V2.     Past Medical History:  Diagnosis Date  . AAA (abdominal aortic aneurysm) ([place])   . Anemia   . Ankle fracture    left  . Aortic regurgitation   . Atherosclerosis   . Complication of anesthesia    CONFUSION - Pt's family very concerned about this  . DDD (degenerative disc disease)   . Degenerative arthritis   . Dermatitis, atopic ARMS AND LEGS  . Diverticulosis   . Erectile  dysfunction   . Frequency of urination   . Glaucoma   . History of asbestos exposure   . History of radiation therapy 8/19-13-10/18/11   prostate, 35 GY  . History of shingles 2012-- BILATERAL EYES--  NO RESIDUAL  . Hyperlipidemia   . Hypertension   . Inguinal hernia   . Lumbar scoliosis   . Nocturia   . OA (osteoarthritis) of hip RIGHT  . Prostate cancer ([place]) 05/11/2011   bx=Adenocarcinoma,gleason3+4=7, 4+4=8,PSA=10.30volume=45.7cc  . Renal cyst    bilateral  . Smokers' cough ([place])   . Stroke ([place])   . Thyroid cyst     Past Surgical History:  Procedure Laterality Date  . ATTEMPTED LEFT VATS/ LEFT THORACOTOMY/ RESECTION OF THE ENORMOUS, PROBABLE BRONCHOGENIC CYST  01-04-2005  DR Arlyce Dice  . CARDIAC CATHETERIZATION  02-24-2009  DR NASHER   MINOR CORONARY ARTERY IRREGULARITIES/ NORMAL LVSF/ EF 65-70%  . CATARACT EXTRACTION    . COLONOSCOPY W/ POLYPECTOMY    . CYSTOSCOPY  11/15/2011   Procedure: CYSTOSCOPY;  Surgeon: Dutch Gray, MD;  Location: [facility];  Service: Urology;  Laterality: N/A;  no seeds seen in bladder  . esophageus cyst removal  YRS AGO  . [place]  . ORIF ANKLE FRACTURE Left 05/03/2017  . ORIF ANKLE FRACTURE Left 05/03/2017   Procedure: OPEN REDUCTION INTERNAL FIXATION (ORIF) BIMALLEOLAR  ANKLE FRACTURE;  Surgeon: Wylene Simmer, MD;  Location: [facility];  Service: Orthopedics;  Laterality: Left;  . PACEMAKER IMPLANT N/A 09/16/2017  Procedure: PACEMAKER IMPLANT;  Surgeon: Constance Haw, MD;  Location: [facility];  Service: Cardiovascular;  Laterality: N/A;  . PROSTATE BIOPSY  05/11/11   Adenocarcinoma ([facility] OFFICE)  . RADIOACTIVE SEED IMPLANT  11/15/2011   Procedure: RADIOACTIVE SEED IMPLANT;  Surgeon: Dutch Gray, MD;  Location: [facility];  Service: Urology;  Laterality: N/A;  Total number of seeds - 52  . REPAIR RIGHT INGUINAL HERNIA W/ MESH  09-10-1999  . TOTAL HIP ARTHROPLASTY Right 03/20/2013   Procedure:  RIGHT TOTAL HIP ARTHROPLASTY ANTERIOR APPROACH;  Surgeon: [name], MD;  Location: [facility];  Service: Orthopedics;  Laterality: Right;  . TOTAL HIP REVISION Right 05/14/2013   Procedure: open reduction internal fixation REVISION RIGHT HIP ;  Surgeon: [name], MD;  Location: [facility];  Service: Orthopedics;  Laterality: Right;  . UMBILICAL HERNIA REPAIR  1998   epigastric     Inpatient Medications: Scheduled Meds: . aspirin  325 mg Oral Once  . heparin  4,000 Units Intravenous Once  . sodium chloride flush  3 mL Intravenous Once   Continuous Infusions: . heparin     PRN Meds:   Home Meds: Prior to Admission medications   Medication Sig Start Date End Date Taking? Authorizing Provider  acetaminophen (TYLENOL) 500 MG tablet Take 1,000 mg by mouth 2 (two) times daily.     [provider]  Ascorbic Acid (VITAMIN C) 1000 MG tablet Take 1,000 mg by mouth daily.    [provider]  aspirin EC 81 MG tablet Take 81 mg by mouth daily.    [provider]  b complex vitamins tablet Take 1 tablet by mouth daily.    [provider]  Calcium Carbonate Antacid (TUMS PO) Take 1 tablet by mouth daily.    [provider]  carbidopa-levodopa (SINEMET IR) 25-100 MG tablet Take 1 tablet daily 01/09/19   Cameron Sprang, MD  Cholecalciferol (VITAMIN D-3) 125 MCG (5000 UT) TABS Take 1 capsule by mouth 2 (two) times daily.     [provider]  clobetasol cream (TEMOVATE) 4.94 % Apply 1 application topically daily as needed.  09/07/17   [provider]  Cyanocobalamin 1000 MCG TBCR Take 1 tablet by mouth daily.    [provider]  folic acid (FOLVITE) 1 MG tablet Take 1 mg by mouth daily.    [provider]  hydroxychloroquine (PLAQUENIL) 200 MG tablet  04/28/19   [provider]  Hydroxychloroquine Sulfate (PLAQUENIL PO) Take by mouth.    [provider]  lidocaine-prilocaine (EMLA) cream Apply 1  application topically as needed. 06/08/18   Dohmeier, Asencion Partridge, MD  meclizine (ANTIVERT) 12.5 MG tablet Take 1 tablet as needed for dizziness 01/09/19   Cameron Sprang, MD  NONFORMULARY OR COMPOUNDED ITEM [facility] Neuropathy Cream 120 grams 3 refills  Sent in 07-04-18    [provider]  Omega 3 1000 MG CAPS Take 1 tablet by mouth daily.    [provider]  pregabalin (LYRICA) 25 MG capsule Take 1 capsule every night 06/26/19   Cameron Sprang, MD  Probiotic Product (PROBIOTIC PO) Take 1 capsule by mouth daily.     [provider]  Probiotic Product (PROBIOTIC-10 PO) Take by mouth.    [provider]  Ubiquinol (QUNOL COQ10/UBIQUINOL/MEGA) 100 MG CAPS Take 1 tablet by mouth at bedtime.     [provider]  umeclidinium-vilanterol (ANORO ELLIPTA) 62.5-25 MCG/INH AEPB Only open the  device one time and then take your two separate drags to be sure you get it all 01/19/18   Tanda Rockers, MD  UNABLE TO FIND Med Name: CPAP per Dr Beacher May    [provider]  valACYclovir (VALTREX) 1000 MG tablet Take 1,000 mg by mouth daily.    [provider]  zinc gluconate 50 MG tablet Take 50 mg by mouth daily.    [provider]    Allergies:    Allergies  Allergen Reactions  . Meloxicam Other (See Comments)  . Oxycodone Other (See Comments)  . Dilaudid [Hydromorphone Hcl] Other (See Comments)    Confusion   . Methocarbamol Other (See Comments)    Drowsiness   . Percodan [Oxycodone-Aspirin] Other (See Comments)    Confusion   . Tramadol Other (See Comments)    Drowsiness   . Vicodin [Hydrocodone-Acetaminophen] Other (See Comments)    Confusion     Social History:   Social History   Socioeconomic History  . Marital status: Married    Spouse name: Alison Murray  . Number of children: 4  . Years of education: College  . Highest education level: Not on file  Occupational History  . Occupation:       Employer: [employer]    Comment: Retired  Tobacco Use  . Smoking status: Former Smoker    Packs/day: 0.50    Years: 62.00    Pack years: 31.00    Types: Cigarettes  . Smokeless tobacco: Never Used  . Tobacco comment: smoke one or two per day  Substance and Sexual Activity  . Alcohol use: No    Alcohol/week: 1.0 standard drinks    Types: 1 Cans of beer per week  . Drug use: No    Comment: quit smoking 02/2012  . Sexual activity: Not Currently    Partners: Female  Other Topics Concern  . Not on file  Social History Narrative   Patient is Married Designer, television/film set), and lives at home with his wife 93 years   Patient has 3 daughters and 1 son.   Patient has a college education.   Patient is right-handed.   Patient drinks one cup of coffee, 1-2 cups of tea daily and rarely drinks soda.   Retired from [employer] Strain:   . Difficulty of Paying Living Expenses:   Food Insecurity:   . Worried About Charity fundraiser in the Last Year:   . Arboriculturist in the Last Year:   Transportation Needs:   . Film/video editor (Medical):   Marland Kitchen Lack of Transportation (Non-Medical):   Physical Activity:   . Days of Exercise per Week:   . Minutes of Exercise per Session:   Stress:   . Feeling of Stress :   Social Connections:   . Frequency of Communication with Friends and Family:   . Frequency of Social Gatherings with Friends and Family:   . Attends Religious Services:   . Active Member of Clubs or Organizations:   . Attends Archivist Meetings:   Marland Kitchen Marital Status:   Intimate Partner Violence:   . Fear of Current or Ex-Partner:   . Emotionally Abused:   Marland Kitchen Physically Abused:   . Sexually Abused:      Family History:    Family History  Problem Relation Age of Onset  . Hypertension Mother   . Prostate cancer Brother  surgery/cured  . Lung cancer Brother        new primary/same brother      ROS:  Unable to complete due to  dementia.     Physical Exam/Data:   Vitals:   07/01/19 2115 07/01/19 2130 07/01/19 2145 07/01/19 2215  BP: (!) 139/96  139/85 134/85  Pulse:  91 (!) 107 89  Resp: (!) 28 20 (!) 22 18  Temp:      TempSrc:      SpO2:  97% 100% 99%  Weight:      Height:       No intake or output data in the 24 hours ending 07/01/19 2225 Last 3 Weights 07/01/2019 07/01/2019 05/03/2019  Weight (lbs) 164 lb 175 lb 0.7 oz 175 lb  Weight (kg) 74.39 kg 79.4 kg 79.379 kg     Body mass index is 20.5 kg/m.  General: Well appearing older male Head: Normocephalic, atraumatic, sclera non-icteric, no xanthomas, nares are without discharge.  Neck: Negative for carotid bruits. JVD not elevated. Lungs: Clear bilaterally to auscultation without wheezes, rales, or rhonchi. Breathing is unlabored. Heart: RRR with S1 S2. No murmurs, rubs, or gallops appreciated. Abdomen: Soft, non-tender, non-distended with normoactive bowel sounds. No hepatomegaly. No rebound/guarding. No obvious abdominal masses. Msk:  Strength and tone appear normal for age. Extremities: No clubbing or cyanosis. No edema.  Distal pedal pulses are 2+ and equal bilaterally. Neuro: Alert and oriented X 3. No facial asymmetry. No focal deficit. Moves all extremities spontaneously. Psych:  Responds to questions appropriately with a normal affect.   EKG:  The EKG was personally reviewed and demonstrates:  See HPI Telemetry:  Telemetry was personally reviewed and demonstrates:  Sinus rhythm  Relevant CV Studies: 09/15/2017 Echocardiogram - Left ventricle: The cavity size was normal. Systolic function was  normal. The estimated ejection fraction was in the range of 55%  to 60%. Although no diagnostic regional wall motion abnormality  was identified, this possibility cannot be completely excluded on  the basis of this study. Doppler parameters are consistent with  abnormal left ventricular relaxation (grade 1 diastolic  dysfunction).  - Aortic  valve: There was mild regurgitation.  - Left atrium: The atrium was mildly dilated.  - Pericardium, extracardiac: A trivial pericardial effusion was  identified.    Laboratory Data:  High Sensitivity Troponin:   Recent Labs  Lab 07/01/19 1905 07/01/19 2040  TROPONINIHS >27,000* >27,000*     Cardiac EnzymesNo results for input(s): TROPONINI in the last 168 hours. No results for input(s): TROPIPOC in the last 168 hours.  Chemistry Recent Labs  Lab 07/01/19 1905  NA 138  K 4.0  CL 108  CO2 20*  GLUCOSE 162*  BUN 18  CREATININE 1.59*  CALCIUM 9.3  GFRNONAA 39*  GFRAA 45*  ANIONGAP 10    No results for input(s): PROT, ALBUMIN, AST, ALT, ALKPHOS, BILITOT in the last 168 hours. Hematology Recent Labs  Lab 07/01/19 1905  WBC 7.9  RBC 4.37  HGB 12.3*  HCT 38.7*  MCV 88.6  MCH 28.1  MCHC 31.8  RDW 15.3  PLT 188   BNPNo results for input(s): BNP, PROBNP in the last 168 hours.  DDimer No results for input(s): DDIMER in the last 168 hours.   Radiology/Studies:  DG Chest 2 View  Result Date: 07/01/2019 CLINICAL DATA:  Weakness with slip and fall. EXAM: CHEST - 2 VIEW COMPARISON:  September 17, 2017 FINDINGS: There is a dual lead AICD. A trace amount of atelectasis  is noted within the bilateral lung bases. There is no evidence of a pleural effusion or pneumothorax. The heart size and mediastinal contours are within normal limits. Multilevel degenerative changes seen throughout the thoracic spine. IMPRESSION: Trace amount of bibasilar atelectasis without acute or active cardiopulmonary disease. Electronically Signed   By: Virgina Norfolk M.D.   On: 07/01/2019 19:28    Assessment and Plan:   1. NSTEMI 86 yo man with no chest pain but several days of fatigue and weakness.  Possible that he has had some chest pain but has not communicated it due to his dementia.  His troponin is significantly elevated and is consistent with NSTEMI.  His ECG has subtle ST elevations that do  not meet criteria for STEMI.  Currently he appears well with stable vital signs.  Will plan to treat with IV heparin for NSTEMI.  Echocardiogram tomorrow.  NPO at midnight for possible LHC tomorrow.  We discussed beta blockers, but his daughter wants to hold off for now due to his history of hypotension. -- IV heparin for ACS -- echocardiogram (ordered) -- NPO at midnight for LHC       For questions or updates, please contact [place] Please consult [email or web address] for contact info under     Signed, Chriss Czar, MD  07/01/2019 10:25 PM

## 2019-07-01 NOTE — Progress Notes (Signed)
ANTICOAGULATION CONSULT NOTE - Initial Consult  Pharmacy Consult for heparin  Indication: chest pain/ACS  Allergies  Allergen Reactions  . Meloxicam Other (See Comments)  . Oxycodone Other (See Comments)  . Dilaudid [Hydromorphone Hcl] Other (See Comments)    Confusion   . Methocarbamol Other (See Comments)    Drowsiness   . Percodan [Oxycodone-Aspirin] Other (See Comments)    Confusion   . Tramadol Other (See Comments)    Drowsiness   . Vicodin [Hydrocodone-Acetaminophen] Other (See Comments)    Confusion     Patient Measurements: Height: 6\' 3"  (190.5 cm) Weight: 74.4 kg (164 lb) IBW/kg (Calculated) : 84.5   Vital Signs: Temp: 99.6 F (37.6 C) (06/06 1850) Temp Source: Oral (06/06 1850) BP: 139/85 (06/06 2145) Pulse Rate: 107 (06/06 2145)  Labs: Recent Labs    07/01/19 1905 07/01/19 2040  HGB 12.3*  --   HCT 38.7*  --   PLT 188  --   CREATININE 1.59*  --   CKTOTAL  --  2,554*  TROPONINIHS >27,000* >27,000*    Estimated Creatinine Clearance: 35.7 mL/min (A) (by C-G formula based on SCr of 1.59 mg/dL (H)).   Medical History: Past Medical History:  Diagnosis Date  . AAA (abdominal aortic aneurysm) ([place])   . Anemia   . Ankle fracture    left  . Aortic regurgitation   . Atherosclerosis   . Complication of anesthesia    CONFUSION - Pt's family very concerned about this  . DDD (degenerative disc disease)   . Degenerative arthritis   . Dermatitis, atopic ARMS AND LEGS  . Diverticulosis   . Erectile dysfunction   . Frequency of urination   . Glaucoma   . History of asbestos exposure   . History of radiation therapy 8/19-13-10/18/11   prostate, 1 GY  . History of shingles 2012-- BILATERAL EYES--  NO RESIDUAL  . Hyperlipidemia   . Hypertension   . Inguinal hernia   . Lumbar scoliosis   . Nocturia   . OA (osteoarthritis) of hip RIGHT  . Prostate cancer ([place]) 05/11/2011   bx=Adenocarcinoma,gleason3+4=7, 4+4=8,PSA=10.30volume=45.7cc  . Renal cyst     bilateral  . Smokers' cough ([place])   . Stroke ([place])   . Thyroid cyst      Assessment: 86 yo male with elevated troponin to start heparin for r/o ACS. No anticoagulants noted PTA.  -Hg= 12.3   Goal of Therapy:  Heparin level 0.3-0.7 units/ml Monitor platelets by anticoagulation protocol: Yes   Plan:   -Heparin bolus 4000 units IV followed by 850 units/hr  -Heparin level in 8 hours and daily wth CBC daily  Hildred Laser, PharmD Clinical Pharmacist **Pharmacist phone directory can now be found on [email or web address] ([email or web address]).  Listed under [place].

## 2019-07-01 NOTE — H&P (Signed)
History and Physical    Derek Carr IRS:[id number] DOB: 11/14/1931 DOA: 07/01/2019  PCP: [name], MD  Patient coming from: Home, accompanied by daughter  I have personally briefly reviewed patient's old medical records in [place]  Chief Complaint: Worsening weakness and confusion  HPI: Derek Carr is a 86 y.o. male with medical history significant for AAA, sinus node dysfunction s/p PPM,, orthostatic hypotension, prostate cancer, OSA on CPAP, TIAs, CKD stage 3a, Alzheimer/Parkinson-like symptoms who presents with concerns of worsening weakness, fall and confusion.  Daughter at bedside provides the history as patient is confused and has dementia.  She says that patient has been following with neurology for about 5 years for gradual decline in his cognition and mobility.  For the past week he has actually been doing better with his gait but 2 days ago after he went for annual physical he suddenly began to have worsening weakness walking in the parking lot to the car.  Today he slid out of the bed and states that he landed on his bottom and he tried to flip himself on his abdomen to get back up.  Daughter found him face down. Denies any loss of consciousness.  Patient has issues with chronic dizziness in the past but this has improved significantly since he has been taken off of majority of his blood pressure medications.  He denies any chest pain, shortness of breath.   ED Course: He is afebrile with mildly elevated heart rate in the 90s and normotensive on room air. Lab work notable for elevated troponin of 27,000 with repeat of the same.  CK of 2554.  EKG shows subtle ST elevation in lead I, aVF, V 4 through V6 but did not meet criteria for STEMI. No leukocytosis and mild anemia of 12.3.  Glucose of 162.  Creatinine is elevated to 1.59.  [employer] pacemaker showed non-sustained VT on 6/2.   CT head, CTA neck and head was negative for any large vessel occlusion and had  multifocal moderate to severe stenosis involving the right.  Colossal stenosis, moderate left MCA stenosis and severe multifocal left P2 stenosis.  Cardiology evaluated patient in the ED for NSTEMI and would like hospitalist to admit for medical management.   Review of Systems: Unable to fully obtain given patient's dementia  Past Medical History:  Diagnosis Date  . AAA (abdominal aortic aneurysm) ([place])   . Anemia   . Ankle fracture    left  . Aortic regurgitation   . Atherosclerosis   . Complication of anesthesia    CONFUSION - Pt's family very concerned about this  . DDD (degenerative disc disease)   . Degenerative arthritis   . Dermatitis, atopic ARMS AND LEGS  . Diverticulosis   . Erectile dysfunction   . Frequency of urination   . Glaucoma   . History of asbestos exposure   . History of radiation therapy 8/19-13-10/18/11   prostate, 10 GY  . History of shingles 2012-- BILATERAL EYES--  NO RESIDUAL  . Hyperlipidemia   . Hypertension   . Inguinal hernia   . Lumbar scoliosis   . Nocturia   . OA (osteoarthritis) of hip RIGHT  . Prostate cancer ([place]) 05/11/2011   bx=Adenocarcinoma,gleason3+4=7, 4+4=8,PSA=10.30volume=45.7cc  . Renal cyst    bilateral  . Smokers' cough ([place])   . Stroke ([place])   . Thyroid cyst     Past Surgical History:  Procedure Laterality Date  . ATTEMPTED LEFT VATS/ LEFT THORACOTOMY/ RESECTION OF THE ENORMOUS,  PROBABLE BRONCHOGENIC CYST  01-04-2005  DR Arlyce Dice  . CARDIAC CATHETERIZATION  02-24-2009  DR NASHER   MINOR CORONARY ARTERY IRREGULARITIES/ NORMAL LVSF/ EF 65-70%  . CATARACT EXTRACTION    . COLONOSCOPY W/ POLYPECTOMY    . CYSTOSCOPY  11/15/2011   Procedure: CYSTOSCOPY;  Surgeon: Dutch Gray, MD;  Location: [facility];  Service: Urology;  Laterality: N/A;  no seeds seen in bladder  . esophageus cyst removal  YRS AGO  . [place]  . ORIF ANKLE FRACTURE Left 05/03/2017  . ORIF ANKLE FRACTURE Left 05/03/2017    Procedure: OPEN REDUCTION INTERNAL FIXATION (ORIF) BIMALLEOLAR  ANKLE FRACTURE;  Surgeon: Wylene Simmer, MD;  Location: [facility];  Service: Orthopedics;  Laterality: Left;  . PACEMAKER IMPLANT N/A 09/16/2017   Procedure: PACEMAKER IMPLANT;  Surgeon: Constance Haw, MD;  Location: [facility];  Service: Cardiovascular;  Laterality: N/A;  . PROSTATE BIOPSY  05/11/11   Adenocarcinoma ([facility])  . RADIOACTIVE SEED IMPLANT  11/15/2011   Procedure: RADIOACTIVE SEED IMPLANT;  Surgeon: Dutch Gray, MD;  Location: [facility];  Service: Urology;  Laterality: N/A;  Total number of seeds - 52  . REPAIR RIGHT INGUINAL HERNIA W/ MESH  09-10-1999  . TOTAL HIP ARTHROPLASTY Right 03/20/2013   Procedure: RIGHT TOTAL HIP ARTHROPLASTY ANTERIOR APPROACH;  Surgeon: [name], MD;  Location: [facility];  Service: Orthopedics;  Laterality: Right;  . TOTAL HIP REVISION Right 05/14/2013   Procedure: open reduction internal fixation REVISION RIGHT HIP ;  Surgeon: [name], MD;  Location: [facility];  Service: Orthopedics;  Laterality: Right;  . UMBILICAL HERNIA REPAIR  1998   epigastric     reports that he has quit smoking. His smoking use included cigarettes. He has a 31.00 pack-year smoking history. He has never used smokeless tobacco. He reports that he does not drink alcohol or use drugs.  Allergies  Allergen Reactions  . Meloxicam Other (See Comments)  . Oxycodone Other (See Comments)  . Dilaudid [Hydromorphone Hcl] Other (See Comments)    Confusion   . Methocarbamol Other (See Comments)    Drowsiness   . Percodan [Oxycodone-Aspirin] Other (See Comments)    Confusion   . Tramadol Other (See Comments)    Drowsiness   . Vicodin [Hydrocodone-Acetaminophen] Other (See Comments)    Confusion     Family History  Problem Relation Age of Onset  . Hypertension Mother   . Prostate cancer Brother        surgery/cured  . Lung cancer Brother        new primary/same brother      Prior to Admission medications   Medication Sig Start Date End Date Taking? Authorizing Provider  acetaminophen (TYLENOL) 500 MG tablet Take 1,000 mg by mouth 2 (two) times daily.     [provider]  Ascorbic Acid (VITAMIN C) 1000 MG tablet Take 1,000 mg by mouth daily.    [provider]  aspirin EC 81 MG tablet Take 81 mg by mouth daily.    [provider]  b complex vitamins tablet Take 1 tablet by mouth daily.    [provider]  Calcium Carbonate Antacid (TUMS PO) Take 1 tablet by mouth daily.    [provider]  carbidopa-levodopa (SINEMET IR) 25-100 MG tablet Take 1 tablet daily 01/09/19   Cameron Sprang, MD  Cholecalciferol (VITAMIN D-3) 125 MCG (5000 UT) TABS Take 1 capsule by mouth 2 (two)  times daily.     [provider]  clobetasol cream (TEMOVATE) 5.73 % Apply 1 application topically daily as needed.  09/07/17   [provider]  Cyanocobalamin 1000 MCG TBCR Take 1 tablet by mouth daily.    [provider]  folic acid (FOLVITE) 1 MG tablet Take 1 mg by mouth daily.    [provider]  hydroxychloroquine (PLAQUENIL) 200 MG tablet  04/28/19   [provider]  Hydroxychloroquine Sulfate (PLAQUENIL PO) Take by mouth.    [provider]  lidocaine-prilocaine (EMLA) cream Apply 1 application topically as needed. 06/08/18   Dohmeier, Asencion Partridge, MD  meclizine (ANTIVERT) 12.5 MG tablet Take 1 tablet as needed for dizziness 01/09/19   Cameron Sprang, MD  NONFORMULARY OR COMPOUNDED ITEM [facility] Neuropathy Cream 120 grams 3 refills  Sent in 07-04-18    [provider]  Omega 3 1000 MG CAPS Take 1 tablet by mouth daily.    [provider]  pregabalin (LYRICA) 25 MG capsule Take 1 capsule every night 06/26/19   Cameron Sprang, MD  Probiotic Product (PROBIOTIC PO) Take 1 capsule by mouth daily.     [provider]  Probiotic Product (PROBIOTIC-10 PO) Take by mouth.     [provider]  Ubiquinol (QUNOL COQ10/UBIQUINOL/MEGA) 100 MG CAPS Take 1 tablet by mouth at bedtime.     [provider]  umeclidinium-vilanterol (ANORO ELLIPTA) 62.5-25 MCG/INH AEPB Only open the device one time and then take your two separate drags to be sure you get it all 01/19/18   Tanda Rockers, MD  UNABLE TO FIND Med Name: CPAP per Dr Beacher May    [provider]  valACYclovir (VALTREX) 1000 MG tablet Take 1,000 mg by mouth daily.    [provider]  zinc gluconate 50 MG tablet Take 50 mg by mouth daily.    [provider]    Physical Exam: Vitals:   07/01/19 2300 07/01/19 2315 07/01/19 2330 07/01/19 2345  BP: (!) 141/85 138/82 (!) 145/79 137/80  Pulse: 91 88 92 89  Resp: (!) 24 (!) 22 17 18   Temp:      TempSrc:      SpO2: 97% 96% 100% 97%  Weight:      Height:        Constitutional: NAD, calm, comfortable, pleasant elderly gentleman lying flat in bed Vitals:   07/01/19 2300 07/01/19 2315 07/01/19 2330 07/01/19 2345  BP: (!) 141/85 138/82 (!) 145/79 137/80  Pulse: 91 88 92 89  Resp: (!) 24 (!) 22 17 18   Temp:      TempSrc:      SpO2: 97% 96% 100% 97%  Weight:      Height:       Eyes: PERRL, lids and conjunctivae normal ENMT: Mucous membranes are moist. Posterior pharynx clear of any exudate or lesions.Normal dentition.  Neck: normal, supple Respiratory: clear to auscultation bilaterally, no wheezing, no crackles. Normal respiratory effort. No accessory muscle use.  Cardiovascular: Regular rate and rhythm, no murmurs / rubs / gallops. No extremity edema. 2+ pedal pulses.   Abdomen: no tenderness, no masses palpated.  Bowel sounds positive.  Musculoskeletal: no clubbing / cyanosis. No joint deformity upper and lower extremities. Good ROM, no contractures. Normal muscle tone.  Skin: no rashes, lesions, ulcers. No induration Neurologic: Chronic dysarthria.  CN 2-12 grossly intact. Sensation intact. Strength 5/5 in all 4.   Intact finger-nose. Psychiatric: Normal judgment and insight. Alert and oriented to  assess orientation given the patient's dysarthria.  Normal mood.     Labs on Admission: I have personally reviewed following labs and imaging studies  CBC: Recent Labs  Lab 07/01/19 1905  WBC 7.9  HGB 12.3*  HCT 38.7*  MCV 88.6  PLT 431   Basic Metabolic Panel: Recent Labs  Lab 07/01/19 1905  NA 138  K 4.0  CL 108  CO2 20*  GLUCOSE 162*  BUN 18  CREATININE 1.59*  CALCIUM 9.3   GFR: Estimated Creatinine Clearance: 35.7 mL/min (A) (by C-G formula based on SCr of 1.59 mg/dL (H)). Liver Function Tests: No results for input(s): AST, ALT, ALKPHOS, BILITOT, PROT, ALBUMIN in the last 168 hours. No results for input(s): LIPASE, AMYLASE in the last 168 hours. No results for input(s): AMMONIA in the last 168 hours. Coagulation Profile: No results for input(s): INR, PROTIME in the last 168 hours. Cardiac Enzymes: Recent Labs  Lab 07/01/19 2040  CKTOTAL 2,554*   BNP (last 3 results) No results for input(s): PROBNP in the last 8760 hours. HbA1C: No results for input(s): HGBA1C in the last 72 hours. CBG: No results for input(s): GLUCAP in the last 168 hours. Lipid Profile: No results for input(s): CHOL, HDL, LDLCALC, TRIG, CHOLHDL, LDLDIRECT in the last 72 hours. Thyroid Function Tests: No results for input(s): TSH, T4TOTAL, FREET4, T3FREE, THYROIDAB in the last 72 hours. Anemia Panel: No results for input(s): VITAMINB12, FOLATE, FERRITIN, TIBC, IRON, RETICCTPCT in the last 72 hours. Urine analysis:    Component Value Date/Time   COLORURINE YELLOW 09/14/2017 [place] 09/14/2017 1540   LABSPEC 1.019 09/14/2017 1540   PHURINE 5.0 09/14/2017 1540   GLUCOSEU NEGATIVE 09/14/2017 1540   HGBUR NEGATIVE 09/14/2017 1540   BILIRUBINUR NEGATIVE 09/14/2017 1540   KETONESUR NEGATIVE 09/14/2017 1540   PROTEINUR NEGATIVE 09/14/2017 1540   UROBILINOGEN 0.2 05/14/2013 1400    NITRITE NEGATIVE 09/14/2017 1540   LEUKOCYTESUR NEGATIVE 09/14/2017 1540    Radiological Exams on Admission: CT Angio Head W or Wo Contrast  Result Date: 07/01/2019 CLINICAL DATA:  Initial evaluation for acute trauma, fall, weakness, confusion. EXAM: CT ANGIOGRAPHY HEAD AND NECK TECHNIQUE: Multidetector CT imaging of the head and neck was performed using the standard protocol during bolus administration of intravenous contrast. Multiplanar CT image reconstructions and MIPs were obtained to evaluate the vascular anatomy. Carotid stenosis measurements (when applicable) are obtained utilizing NASCET criteria, using the distal internal carotid diameter as the denominator. CONTRAST:  71mL OMNIPAQUE IOHEXOL 350 MG/ML SOLN COMPARISON:  Prior CT from 03/02/2017. FINDINGS: CT HEAD FINDINGS Brain: Generalized age-related cerebral atrophy with chronic small vessel ischemic disease, stable. No acute intracranial hemorrhage. No acute large vessel territory infarct. No mass lesion, midline shift or mass effect. No hydrocephalus or extra-axial fluid collection. Vascular: No hyperdense vessel. Calcified atherosclerosis present at skull base. Skull: Scalp soft tissues demonstrate no acute finding. Calvarium intact. Sinuses: Mild scattered mucosal thickening noted within the ethmoidal air cells. Small amount of pneumatized secretions present within the left sphenoid sinus. Small right mastoid effusion noted, of doubtful significance. Orbits: Globes and orbital soft tissues demonstrate no acute finding. Review of the MIP images confirms the above findings CTA NECK FINDINGS Aortic arch: Visualized aortic arch of normal caliber with normal 3 vessel morphology. Moderate atherosclerotic change about the arch and origin of the great vessels without flow-limiting stenosis. Right carotid system: Right common and internal carotid arteries patent without stenosis, dissection or occlusion. Mild eccentric calcified plaque at the right  bifurcation without stenosis. Left carotid system: Left common and internal carotid arteries widely patent without stenosis, dissection, or occlusion. No significant atheromatous narrowing about the left bifurcation. Vertebral arteries: Both vertebral arteries arise from the subclavian arteries. No proximal subclavian stenosis. Vertebral arteries patent within the neck without stenosis, dissection, or occlusion. Skeleton: No visible acute osseous abnormality. No discrete or worrisome osseous lesions. Moderate to advanced degenerative spondylosis noted at C3-4 through C7-T1. Other neck: No other acute soft tissue abnormality within the neck. No mass lesion or adenopathy. Upper chest: Left-sided pacemaker/AICD noted. Dependent atelectatic changes noted within the visualized right lung. Underlying centrilobular emphysema. Review of the MIP images confirms the above findings CTA HEAD FINDINGS Anterior circulation: Petrous segments patent bilaterally. Scattered calcified atheromatous plaque throughout the cavernous/supraclinoid ICAs with associated mild to moderate multifocal narrowing (no greater than 50%). A1 segments patent bilaterally. Right A1 slightly hypoplastic. Normal anterior communicating artery complex. Anterior cerebral arteries widely patent at the proximal and mid aspects. Short-segment moderate to severe distal right ACA/pericallosal stenosis noted (series 14, image 13). M1 segments patent bilaterally. Normal MCA bifurcations. Focal moderate stenosis at the left MCA bifurcation at the origins of both superior and inferior divisions (series 14, image 19). Right MCA bifurcation within normal limits. Distal MCA branches well perfused and symmetric. Posterior circulation: Scattered atheromatous plaque within the bilateral V4 segments without high-grade stenosis, right slightly worse than left. Both picas patent. Basilar patent to its distal aspect without stenosis. Superior cerebral arteries patent  bilaterally. Both PCAs primarily supplied via the basilar. Severe multifocal stenoses seen involving the left P2 segment (series 14, image 20). PCAs otherwise well perfused to their distal aspects. Venous sinuses: Grossly patent allowing for timing the contrast bolus. Anatomic variants: None significant.  No intracranial aneurysm. Review of the MIP images confirms the above findings IMPRESSION: CT HEAD IMPRESSION: 1. No acute intracranial abnormality identified. 2. Age-related cerebral atrophy with mild to moderate chronic microvascular ischemic disease. CTA HEAD AND NECK IMPRESSION: 1. Negative CTA for large vessel occlusion or other acute vascular abnormality. 2. Multifocal moderate to severe stenoses involving the intracranial circulation as above including moderate to severe right pericallosal stenosis, moderate left MCA bifurcation stenosis, with severe multifocal left P2 stenoses. 3. Additional mild-to-moderate atheromatous change about the aortic arch and carotid siphons without high-grade stenosis. Continued wide patency of the major arterial vasculature within the neck. 4.  Emphysema (ICD10-J43.9). Electronically Signed   By: Jeannine Boga M.D.   On: 07/01/2019 23:10   DG Chest 2 View  Result Date: 07/01/2019 CLINICAL DATA:  Weakness with slip and fall. EXAM: CHEST - 2 VIEW COMPARISON:  September 17, 2017 FINDINGS: There is a dual lead AICD. A trace amount of atelectasis is noted within the bilateral lung bases. There is no evidence of a pleural effusion or pneumothorax. The heart size and mediastinal contours are within normal limits. Multilevel degenerative changes seen throughout the thoracic spine. IMPRESSION: Trace amount of bibasilar atelectasis without acute or active cardiopulmonary disease. Electronically Signed   By: Virgina Norfolk M.D.   On: 07/01/2019 19:28   CT Angio Neck W and/or Wo Contrast  Result Date: 07/01/2019 CLINICAL DATA:  Initial evaluation for acute trauma, fall,  weakness, confusion. EXAM: CT ANGIOGRAPHY HEAD AND NECK TECHNIQUE: Multidetector CT imaging of the head and neck was performed using the standard protocol during bolus administration of intravenous contrast. Multiplanar CT image reconstructions and MIPs were obtained to evaluate the vascular anatomy. Carotid stenosis measurements (when applicable) are obtained utilizing NASCET  criteria, using the distal internal carotid diameter as the denominator. CONTRAST:  46mL OMNIPAQUE IOHEXOL 350 MG/ML SOLN COMPARISON:  Prior CT from 03/02/2017. FINDINGS: CT HEAD FINDINGS Brain: Generalized age-related cerebral atrophy with chronic small vessel ischemic disease, stable. No acute intracranial hemorrhage. No acute large vessel territory infarct. No mass lesion, midline shift or mass effect. No hydrocephalus or extra-axial fluid collection. Vascular: No hyperdense vessel. Calcified atherosclerosis present at skull base. Skull: Scalp soft tissues demonstrate no acute finding. Calvarium intact. Sinuses: Mild scattered mucosal thickening noted within the ethmoidal air cells. Small amount of pneumatized secretions present within the left sphenoid sinus. Small right mastoid effusion noted, of doubtful significance. Orbits: Globes and orbital soft tissues demonstrate no acute finding. Review of the MIP images confirms the above findings CTA NECK FINDINGS Aortic arch: Visualized aortic arch of normal caliber with normal 3 vessel morphology. Moderate atherosclerotic change about the arch and origin of the great vessels without flow-limiting stenosis. Right carotid system: Right common and internal carotid arteries patent without stenosis, dissection or occlusion. Mild eccentric calcified plaque at the right bifurcation without stenosis. Left carotid system: Left common and internal carotid arteries widely patent without stenosis, dissection, or occlusion. No significant atheromatous narrowing about the left bifurcation. Vertebral  arteries: Both vertebral arteries arise from the subclavian arteries. No proximal subclavian stenosis. Vertebral arteries patent within the neck without stenosis, dissection, or occlusion. Skeleton: No visible acute osseous abnormality. No discrete or worrisome osseous lesions. Moderate to advanced degenerative spondylosis noted at C3-4 through C7-T1. Other neck: No other acute soft tissue abnormality within the neck. No mass lesion or adenopathy. Upper chest: Left-sided pacemaker/AICD noted. Dependent atelectatic changes noted within the visualized right lung. Underlying centrilobular emphysema. Review of the MIP images confirms the above findings CTA HEAD FINDINGS Anterior circulation: Petrous segments patent bilaterally. Scattered calcified atheromatous plaque throughout the cavernous/supraclinoid ICAs with associated mild to moderate multifocal narrowing (no greater than 50%). A1 segments patent bilaterally. Right A1 slightly hypoplastic. Normal anterior communicating artery complex. Anterior cerebral arteries widely patent at the proximal and mid aspects. Short-segment moderate to severe distal right ACA/pericallosal stenosis noted (series 14, image 13). M1 segments patent bilaterally. Normal MCA bifurcations. Focal moderate stenosis at the left MCA bifurcation at the origins of both superior and inferior divisions (series 14, image 19). Right MCA bifurcation within normal limits. Distal MCA branches well perfused and symmetric. Posterior circulation: Scattered atheromatous plaque within the bilateral V4 segments without high-grade stenosis, right slightly worse than left. Both picas patent. Basilar patent to its distal aspect without stenosis. Superior cerebral arteries patent bilaterally. Both PCAs primarily supplied via the basilar. Severe multifocal stenoses seen involving the left P2 segment (series 14, image 20). PCAs otherwise well perfused to their distal aspects. Venous sinuses: Grossly patent allowing  for timing the contrast bolus. Anatomic variants: None significant.  No intracranial aneurysm. Review of the MIP images confirms the above findings IMPRESSION: CT HEAD IMPRESSION: 1. No acute intracranial abnormality identified. 2. Age-related cerebral atrophy with mild to moderate chronic microvascular ischemic disease. CTA HEAD AND NECK IMPRESSION: 1. Negative CTA for large vessel occlusion or other acute vascular abnormality. 2. Multifocal moderate to severe stenoses involving the intracranial circulation as above including moderate to severe right pericallosal stenosis, moderate left MCA bifurcation stenosis, with severe multifocal left P2 stenoses. 3. Additional mild-to-moderate atheromatous change about the aortic arch and carotid siphons without high-grade stenosis. Continued wide patency of the major arterial vasculature within the neck. 4.  Emphysema (ICD10-J43.9). Electronically Signed  By: Jeannine Boga M.D.   On: 07/01/2019 23:10      Assessment/Plan  NSTEMI Cardiology has evaluated in ED.  Continue Heparin gtt Echocardiogram NPO past midnight for LHC tomorrow  Elevated CK Most likely secondary to NSTEMI  Acute on chronic kidney stage 3a injury Has received 1L of IV fluids Monitor repeat BMP in the morning.  Avoid nephrotoxic agent.  Dementia/parkinsonian symptoms Continue Sinemet  OSA Continue CPAP  DVT prophylaxis: Heparin infusion Code Status: Full Family Communication: Plan discussed with daughter at bedside  disposition Plan: Home with at least 2 midnight stays  Consults called: Cardiology Admission status: inpatient  Status is: Inpatient  Remains inpatient appropriate because:Inpatient level of care appropriate due to severity of illness   Dispo: The patient is from: Home              Anticipated d/c is to: Home              Anticipated d/c date is: 3 days              Patient currently is not medically stable to d/c.         Orene Desanctis  DO [facility]   If 7PM-7AM, please contact night-coverage [email or web address]   07/01/2019, 11:50 PM

## 2019-07-01 NOTE — ED Triage Notes (Signed)
The pt has had a prior stroke and he is very difficujlt to understand

## 2019-07-01 NOTE — ED Provider Notes (Signed)
[facility] EMERGENCY DEPARTMENT Provider Note   CSN: [id number] Arrival date & time: 07/01/19  1845     History Chief Complaint  Patient presents with  . Weakness    IMRI LOR is a 86 y.o. male hx of AAA, HL, previous stroke here presenting with weakness and trouble walking.  Patient had a annual physical with his doctor 2 days ago and everything was baseline.  For the last 2 days, per the daughter, he has been weaker and more tremulous.  He has a history of Parkinson's disease in the past and he has been very forgetful as well.  Patient has been having a hard time getting out of bed and has fallen several times.  Denies any chest pain or shortness of breath.  Denies any fevers.  The history is provided by the patient.       Past Medical History:  Diagnosis Date  . AAA (abdominal aortic aneurysm) ([place])   . Anemia   . Ankle fracture    left  . Aortic regurgitation   . Atherosclerosis   . Complication of anesthesia    CONFUSION - Pt's family very concerned about this  . DDD (degenerative disc disease)   . Degenerative arthritis   . Dermatitis, atopic ARMS AND LEGS  . Diverticulosis   . Erectile dysfunction   . Frequency of urination   . Glaucoma   . History of asbestos exposure   . History of radiation therapy 8/19-13-10/18/11   prostate, 21 GY  . History of shingles 2012-- BILATERAL EYES--  NO RESIDUAL  . Hyperlipidemia   . Hypertension   . Inguinal hernia   . Lumbar scoliosis   . Nocturia   . OA (osteoarthritis) of hip RIGHT  . Prostate cancer ([place]) 05/11/2011   bx=Adenocarcinoma,gleason3+4=7, 4+4=8,PSA=10.30volume=45.7cc  . Renal cyst    bilateral  . Smokers' cough ([place])   . Stroke ([place])   . Thyroid cyst     Patient Active Problem List   Diagnosis Date Noted  . NSTEMI (non-ST elevated myocardial infarction) ([place]) 07/01/2019  . Sinus node dysfunction ([place]) 11/23/2018  . Pacemaker 11/23/2018  . Idiopathic progressive neuropathy  07/25/2018  . Vascular parkinsonism ([place]) 05/31/2018  . Bradycardia 09/16/2017  . CKD (chronic kidney disease), stage III 09/15/2017  . Near syncope 09/14/2017  . Post-operative pain   . Dementia without behavioral disturbance ([place])   . TIA (transient ischemic attack)   . Essential hypertension   . Bimalleolar fracture of left ankle 05/03/2017  . Axonal neuropathy 03/21/2017  . Right sided weakness 03/01/2017  . Cigarette smoker 06/16/2016  . Dyspnea on exertion 06/15/2016  . Alzheimer disease ([place]) 01/07/2014  . CSA (central sleep apnea) 10/05/2013  . Syncope 06/26/2013  . COPD GOLD II 06/25/2013  . BPH (benign prostatic hyperplasia) 05/18/2013  . Glaucoma 05/18/2013  . S/P right TH revision 05/14/2013  . Constipation 03/26/2013  . Osteoporosis 03/26/2013  . S/P right THA, AA 03/20/2013  . Hyperlipidemia   . Erectile dysfunction   . History of asbestos exposure   . Nocturia   . History of shingles   . Dermatitis, atopic   . OA (osteoarthritis) of hip   . Arthritis   . Frequency of urination   . Lumbar scoliosis   . Malignant neoplasm of prostate ([place]) 06/07/2011  . 86 year old gentleman with stage T3 adenocarcinoma prostate with Gleason score 4+4 and PSA of 10.3 05/11/2011    Past Surgical History:  Procedure Laterality Date  . ATTEMPTED  LEFT VATS/ LEFT THORACOTOMY/ RESECTION OF THE ENORMOUS, PROBABLE BRONCHOGENIC CYST  01-04-2005  DR Arlyce Dice  . CARDIAC CATHETERIZATION  02-24-2009  DR NASHER   MINOR CORONARY ARTERY IRREGULARITIES/ NORMAL LVSF/ EF 65-70%  . CATARACT EXTRACTION    . COLONOSCOPY W/ POLYPECTOMY    . CYSTOSCOPY  11/15/2011   Procedure: CYSTOSCOPY;  Surgeon: Dutch Gray, MD;  Location: [facility];  Service: Urology;  Laterality: N/A;  no seeds seen in bladder  . esophageus cyst removal  YRS AGO  . [place]  . ORIF ANKLE FRACTURE Left 05/03/2017  . ORIF ANKLE FRACTURE Left 05/03/2017   Procedure: OPEN REDUCTION INTERNAL  FIXATION (ORIF) BIMALLEOLAR  ANKLE FRACTURE;  Surgeon: Wylene Simmer, MD;  Location: [facility];  Service: Orthopedics;  Laterality: Left;  . PACEMAKER IMPLANT N/A 09/16/2017   Procedure: PACEMAKER IMPLANT;  Surgeon: Constance Haw, MD;  Location: [facility];  Service: Cardiovascular;  Laterality: N/A;  . PROSTATE BIOPSY  05/11/11   Adenocarcinoma ([facility] OFF[facility])  . RADIOACTIVE SEED IMPLANT  11/15/2011   Procedure: RADIOACTIVE SEED IMPLANT;  Surgeon: Dutch Gray, MD;  Location: [facility];  Service: Urology;  Laterality: N/A;  Total number of seeds - 52  . REPAIR RIGHT INGUINAL HERNIA W/ MESH  09-10-1999  . TOTAL HIP ARTHROPLASTY Right 03/20/2013   Procedure: RIGHT TOTAL HIP ARTHROPLASTY ANTERIOR APPROACH;  Surgeon: [name], MD;  Location: [facility];  Service: Orthopedics;  Laterality: Right;  . TOTAL HIP REVISION Right 05/14/2013   Procedure: open reduction internal fixation REVISION RIGHT HIP ;  Surgeon: [name], MD;  Location: [facility];  Service: Orthopedics;  Laterality: Right;  . UMBILICAL HERNIA REPAIR  1998   epigastric       Family History  Problem Relation Age of Onset  . Hypertension Mother   . Prostate cancer Brother        surgery/cured  . Lung cancer Brother        new primary/same brother    Social History   Tobacco Use  . Smoking status: Former Smoker    Packs/day: 0.50    Years: 62.00    Pack years: 31.00    Types: Cigarettes  . Smokeless tobacco: Never Used  . Tobacco comment: smoke one or two per day  Substance Use Topics  . Alcohol use: No    Alcohol/week: 1.0 standard drinks    Types: 1 Cans of beer per week  . Drug use: No    Comment: quit smoking 02/2012    Home Medications Prior to Admission medications   Medication Sig Start Date End Date Taking? Authorizing Provider  acetaminophen (TYLENOL) 500 MG tablet Take 1,000 mg by mouth 2 (two) times daily.     [provider]  Ascorbic Acid (VITAMIN C) 1000 MG tablet  Take 1,000 mg by mouth daily.    [provider]  aspirin EC 81 MG tablet Take 81 mg by mouth daily.    [provider]  b complex vitamins tablet Take 1 tablet by mouth daily.    [provider]  Calcium Carbonate Antacid (TUMS PO) Take 1 tablet by mouth daily.    [provider]  carbidopa-levodopa (SINEMET IR) 25-100 MG tablet Take 1 tablet daily 01/09/19   Cameron Sprang, MD  Cholecalciferol (VITAMIN D-3) 125 MCG (5000 UT) TABS Take 1 capsule by mouth 2 (two) times daily.     [provider]  clobetasol cream (TEMOVATE)  1.60 % Apply 1 application topically daily as needed.  09/07/17   [provider]  Cyanocobalamin 1000 MCG TBCR Take 1 tablet by mouth daily.    [provider]  folic acid (FOLVITE) 1 MG tablet Take 1 mg by mouth daily.    [provider]  hydroxychloroquine (PLAQUENIL) 200 MG tablet  04/28/19   [provider]  Hydroxychloroquine Sulfate (PLAQUENIL PO) Take by mouth.    [provider]  lidocaine-prilocaine (EMLA) cream Apply 1 application topically as needed. 06/08/18   Dohmeier, Asencion Partridge, MD  meclizine (ANTIVERT) 12.5 MG tablet Take 1 tablet as needed for dizziness 01/09/19   Cameron Sprang, MD  NONFORMULARY OR COMPOUNDED ITEM [facility] Neuropathy Cream 120 grams 3 refills  Sent in 07-04-18    [provider]  Omega 3 1000 MG CAPS Take 1 tablet by mouth daily.    [provider]  pregabalin (LYRICA) 25 MG capsule Take 1 capsule every night 06/26/19   Cameron Sprang, MD  Probiotic Product (PROBIOTIC PO) Take 1 capsule by mouth daily.     [provider]  Probiotic Product (PROBIOTIC-10 PO) Take by mouth.    [provider]  Ubiquinol (QUNOL COQ10/UBIQUINOL/MEGA) 100 MG CAPS Take 1 tablet by mouth at bedtime.     [provider]  umeclidinium-vilanterol (ANORO ELLIPTA) 62.5-25 MCG/INH AEPB Only open the device one time and then take your two  separate drags to be sure you get it all 01/19/18   Tanda Rockers, MD  UNABLE TO FIND Med Name: CPAP per Dr Beacher May    [provider]  valACYclovir (VALTREX) 1000 MG tablet Take 1,000 mg by mouth daily.    [provider]  zinc gluconate 50 MG tablet Take 50 mg by mouth daily.    [provider]    Allergies    Meloxicam, Oxycodone, Dilaudid [hydromorphone hcl], Methocarbamol, Percodan [oxycodone-aspirin], Tramadol, and Vicodin [hydrocodone-acetaminophen]  Review of Systems   Review of Systems  Neurological: Positive for weakness.  All other systems reviewed and are negative.   Physical Exam Updated Vital Signs BP (!) 141/85   Pulse 91   Temp 99.6 F (37.6 C) (Oral)   Resp (!) 24   Ht 6\' 3"  (1.905 m)   Wt 74.4 kg   SpO2 97%   BMI 20.50 kg/m   Physical Exam Vitals and nursing note reviewed.  Constitutional:      Comments: Chronically ill   HENT:     Head: Normocephalic.     Right Ear: Tympanic membrane normal.     Left Ear: Tympanic membrane normal.     Nose: Nose normal.     Mouth/Throat:     Mouth: Mucous membranes are moist.  Eyes:     Extraocular Movements: Extraocular movements intact.     Pupils: Pupils are equal, round, and reactive to light.  Cardiovascular:     Rate and Rhythm: Normal rate and regular rhythm.     Pulses: Normal pulses.     Heart sounds: Normal heart sounds.  Pulmonary:     Effort: Pulmonary effort is normal.     Breath sounds: Normal breath sounds.  Abdominal:     General: Abdomen is flat.     Palpations: Abdomen is soft.  Musculoskeletal:        General: Normal range of motion.     Cervical back: Normal range of motion.  Skin:    General: Skin is warm.     Capillary  Refill: Capillary refill takes less than 2 seconds.  Neurological:     Mental Status: He is alert.     Comments: A & O x 2. Strength 5/5 bilateral arms and legs. Resting tremors. Difficulty sitting up   Psychiatric:        Mood and  Affect: Mood normal.        Behavior: Behavior normal.     ED Results / Procedures / Treatments   Labs (all labs ordered are listed, but only abnormal results are displayed) Labs Reviewed  BASIC METABOLIC PANEL - Abnormal; Notable for the following components:      Result Value   CO2 20 (*)    Glucose, Bld 162 (*)    Creatinine, Ser 1.59 (*)    GFR calc non Af Amer 39 (*)    GFR calc Af Amer 45 (*)    All other components within normal limits  CBC - Abnormal; Notable for the following components:   Hemoglobin 12.3 (*)    HCT 38.7 (*)    All other components within normal limits  CK - Abnormal; Notable for the following components:   Total CK 2,554 (*)    All other components within normal limits  TROPONIN I (HIGH SENSITIVITY) - Abnormal; Notable for the following components:   Troponin I (High Sensitivity) >27,000 (*)    All other components within normal limits  TROPONIN I (HIGH SENSITIVITY) - Abnormal; Notable for the following components:   Troponin I (High Sensitivity) >27,000 (*)    All other components within normal limits  SARS CORONAVIRUS 2 BY RT PCR (HOSPITAL ORDER, [facility])  URINALYSIS, ROUTINE W REFLEX MICROSCOPIC  HEPARIN LEVEL (UNFRACTIONATED)  CBC  MAGNESIUM  BASIC METABOLIC PANEL  TROPONIN I (HIGH SENSITIVITY)    EKG EKG Interpretation  Date/Time:  Sunday July 01 2019 20:38:45 EDT Ventricular Rate:  81 PR Interval:  172 QRS Duration: 148 QT Interval:  383 QTC Calculation: 445 R Axis:   -93 Text Interpretation: Sinus rhythm Right bundle branch block Anterior infarct, old No significant change since last tracing Confirmed by Wandra Arthurs [phone]) on 07/01/2019 8:40:49 PM   Radiology CT Angio Head W or Wo Contrast  Result Date: 07/01/2019 CLINICAL DATA:  Initial evaluation for acute trauma, fall, weakness, confusion. EXAM: CT ANGIOGRAPHY HEAD AND NECK TECHNIQUE: Multidetector CT imaging of the head and neck was performed  using the standard protocol during bolus administration of intravenous contrast. Multiplanar CT image reconstructions and MIPs were obtained to evaluate the vascular anatomy. Carotid stenosis measurements (when applicable) are obtained utilizing NASCET criteria, using the distal internal carotid diameter as the denominator. CONTRAST:  15mL OMNIPAQUE IOHEXOL 350 MG/ML SOLN COMPARISON:  Prior CT from 03/02/2017. FINDINGS: CT HEAD FINDINGS Brain: Generalized age-related cerebral atrophy with chronic small vessel ischemic disease, stable. No acute intracranial hemorrhage. No acute large vessel territory infarct. No mass lesion, midline shift or mass effect. No hydrocephalus or extra-axial fluid collection. Vascular: No hyperdense vessel. Calcified atherosclerosis present at skull base. Skull: Scalp soft tissues demonstrate no acute finding. Calvarium intact. Sinuses: Mild scattered mucosal thickening noted within the ethmoidal air cells. Small amount of pneumatized secretions present within the left sphenoid sinus. Small right mastoid effusion noted, of doubtful significance. Orbits: Globes and orbital soft tissues demonstrate no acute finding. Review of the MIP images confirms the above findings CTA NECK FINDINGS Aortic arch: Visualized aortic arch of normal caliber with normal 3 vessel morphology. Moderate atherosclerotic change about the  arch and origin of the great vessels without flow-limiting stenosis. Right carotid system: Right common and internal carotid arteries patent without stenosis, dissection or occlusion. Mild eccentric calcified plaque at the right bifurcation without stenosis. Left carotid system: Left common and internal carotid arteries widely patent without stenosis, dissection, or occlusion. No significant atheromatous narrowing about the left bifurcation. Vertebral arteries: Both vertebral arteries arise from the subclavian arteries. No proximal subclavian stenosis. Vertebral arteries patent  within the neck without stenosis, dissection, or occlusion. Skeleton: No visible acute osseous abnormality. No discrete or worrisome osseous lesions. Moderate to advanced degenerative spondylosis noted at C3-4 through C7-T1. Other neck: No other acute soft tissue abnormality within the neck. No mass lesion or adenopathy. Upper chest: Left-sided pacemaker/AICD noted. Dependent atelectatic changes noted within the visualized right lung. Underlying centrilobular emphysema. Review of the MIP images confirms the above findings CTA HEAD FINDINGS Anterior circulation: Petrous segments patent bilaterally. Scattered calcified atheromatous plaque throughout the cavernous/supraclinoid ICAs with associated mild to moderate multifocal narrowing (no greater than 50%). A1 segments patent bilaterally. Right A1 slightly hypoplastic. Normal anterior communicating artery complex. Anterior cerebral arteries widely patent at the proximal and mid aspects. Short-segment moderate to severe distal right ACA/pericallosal stenosis noted (series 14, image 13). M1 segments patent bilaterally. Normal MCA bifurcations. Focal moderate stenosis at the left MCA bifurcation at the origins of both superior and inferior divisions (series 14, image 19). Right MCA bifurcation within normal limits. Distal MCA branches well perfused and symmetric. Posterior circulation: Scattered atheromatous plaque within the bilateral V4 segments without high-grade stenosis, right slightly worse than left. Both picas patent. Basilar patent to its distal aspect without stenosis. Superior cerebral arteries patent bilaterally. Both PCAs primarily supplied via the basilar. Severe multifocal stenoses seen involving the left P2 segment (series 14, image 20). PCAs otherwise well perfused to their distal aspects. Venous sinuses: Grossly patent allowing for timing the contrast bolus. Anatomic variants: None significant.  No intracranial aneurysm. Review of the MIP images confirms  the above findings IMPRESSION: CT HEAD IMPRESSION: 1. No acute intracranial abnormality identified. 2. Age-related cerebral atrophy with mild to moderate chronic microvascular ischemic disease. CTA HEAD AND NECK IMPRESSION: 1. Negative CTA for large vessel occlusion or other acute vascular abnormality. 2. Multifocal moderate to severe stenoses involving the intracranial circulation as above including moderate to severe right pericallosal stenosis, moderate left MCA bifurcation stenosis, with severe multifocal left P2 stenoses. 3. Additional mild-to-moderate atheromatous change about the aortic arch and carotid siphons without high-grade stenosis. Continued wide patency of the major arterial vasculature within the neck. 4.  Emphysema (ICD10-J43.9). Electronically Signed   By: Jeannine Boga M.D.   On: 07/01/2019 23:10   DG Chest 2 View  Result Date: 07/01/2019 CLINICAL DATA:  Weakness with slip and fall. EXAM: CHEST - 2 VIEW COMPARISON:  September 17, 2017 FINDINGS: There is a dual lead AICD. A trace amount of atelectasis is noted within the bilateral lung bases. There is no evidence of a pleural effusion or pneumothorax. The heart size and mediastinal contours are within normal limits. Multilevel degenerative changes seen throughout the thoracic spine. IMPRESSION: Trace amount of bibasilar atelectasis without acute or active cardiopulmonary disease. Electronically Signed   By: Virgina Norfolk M.D.   On: 07/01/2019 19:28   CT Angio Neck W and/or Wo Contrast  Result Date: 07/01/2019 CLINICAL DATA:  Initial evaluation for acute trauma, fall, weakness, confusion. EXAM: CT ANGIOGRAPHY HEAD AND NECK TECHNIQUE: Multidetector CT imaging of the head and neck was performed  using the standard protocol during bolus administration of intravenous contrast. Multiplanar CT image reconstructions and MIPs were obtained to evaluate the vascular anatomy. Carotid stenosis measurements (when applicable) are obtained utilizing  NASCET criteria, using the distal internal carotid diameter as the denominator. CONTRAST:  52mL OMNIPAQUE IOHEXOL 350 MG/ML SOLN COMPARISON:  Prior CT from 03/02/2017. FINDINGS: CT HEAD FINDINGS Brain: Generalized age-related cerebral atrophy with chronic small vessel ischemic disease, stable. No acute intracranial hemorrhage. No acute large vessel territory infarct. No mass lesion, midline shift or mass effect. No hydrocephalus or extra-axial fluid collection. Vascular: No hyperdense vessel. Calcified atherosclerosis present at skull base. Skull: Scalp soft tissues demonstrate no acute finding. Calvarium intact. Sinuses: Mild scattered mucosal thickening noted within the ethmoidal air cells. Small amount of pneumatized secretions present within the left sphenoid sinus. Small right mastoid effusion noted, of doubtful significance. Orbits: Globes and orbital soft tissues demonstrate no acute finding. Review of the MIP images confirms the above findings CTA NECK FINDINGS Aortic arch: Visualized aortic arch of normal caliber with normal 3 vessel morphology. Moderate atherosclerotic change about the arch and origin of the great vessels without flow-limiting stenosis. Right carotid system: Right common and internal carotid arteries patent without stenosis, dissection or occlusion. Mild eccentric calcified plaque at the right bifurcation without stenosis. Left carotid system: Left common and internal carotid arteries widely patent without stenosis, dissection, or occlusion. No significant atheromatous narrowing about the left bifurcation. Vertebral arteries: Both vertebral arteries arise from the subclavian arteries. No proximal subclavian stenosis. Vertebral arteries patent within the neck without stenosis, dissection, or occlusion. Skeleton: No visible acute osseous abnormality. No discrete or worrisome osseous lesions. Moderate to advanced degenerative spondylosis noted at C3-4 through C7-T1. Other neck: No other acute  soft tissue abnormality within the neck. No mass lesion or adenopathy. Upper chest: Left-sided pacemaker/AICD noted. Dependent atelectatic changes noted within the visualized right lung. Underlying centrilobular emphysema. Review of the MIP images confirms the above findings CTA HEAD FINDINGS Anterior circulation: Petrous segments patent bilaterally. Scattered calcified atheromatous plaque throughout the cavernous/supraclinoid ICAs with associated mild to moderate multifocal narrowing (no greater than 50%). A1 segments patent bilaterally. Right A1 slightly hypoplastic. Normal anterior communicating artery complex. Anterior cerebral arteries widely patent at the proximal and mid aspects. Short-segment moderate to severe distal right ACA/pericallosal stenosis noted (series 14, image 13). M1 segments patent bilaterally. Normal MCA bifurcations. Focal moderate stenosis at the left MCA bifurcation at the origins of both superior and inferior divisions (series 14, image 19). Right MCA bifurcation within normal limits. Distal MCA branches well perfused and symmetric. Posterior circulation: Scattered atheromatous plaque within the bilateral V4 segments without high-grade stenosis, right slightly worse than left. Both picas patent. Basilar patent to its distal aspect without stenosis. Superior cerebral arteries patent bilaterally. Both PCAs primarily supplied via the basilar. Severe multifocal stenoses seen involving the left P2 segment (series 14, image 20). PCAs otherwise well perfused to their distal aspects. Venous sinuses: Grossly patent allowing for timing the contrast bolus. Anatomic variants: None significant.  No intracranial aneurysm. Review of the MIP images confirms the above findings IMPRESSION: CT HEAD IMPRESSION: 1. No acute intracranial abnormality identified. 2. Age-related cerebral atrophy with mild to moderate chronic microvascular ischemic disease. CTA HEAD AND NECK IMPRESSION: 1. Negative CTA for large  vessel occlusion or other acute vascular abnormality. 2. Multifocal moderate to severe stenoses involving the intracranial circulation as above including moderate to severe right pericallosal stenosis, moderate left MCA bifurcation stenosis, with severe multifocal left P2  stenoses. 3. Additional mild-to-moderate atheromatous change about the aortic arch and carotid siphons without high-grade stenosis. Continued wide patency of the major arterial vasculature within the neck. 4.  Emphysema (ICD10-J43.9). Electronically Signed   By: Jeannine Boga M.D.   On: 07/01/2019 23:10    Procedures Procedures (including critical care time)  CRITICAL CARE Performed by: Wandra Arthurs   Total critical care time: 30 minutes  Critical care time was exclusive of separately billable procedures and treating other patients.  Critical care was necessary to treat or prevent imminent or life-threatening deterioration.  Critical care was time spent personally by me on the following activities: development of treatment plan with patient and/or surrogate as well as nursing, discussions with consultants, evaluation of patient's response to treatment, examination of patient, obtaining history from patient or surrogate, ordering and performing treatments and interventions, ordering and review of laboratory studies, ordering and review of radiographic studies, pulse oximetry and re-evaluation of patient's condition.   Medications Ordered in ED Medications  sodium chloride flush (NS) 0.9 % injection 3 mL (has no administration in time range)  heparin ADULT infusion 100 units/mL (25000 units/26mL sodium chloride 0.45%) (850 Units/hr Intravenous New Bag/Given 07/01/19 2228)  sodium chloride 0.9 % bolus 1,000 mL (0 mLs Intravenous Stopped 07/01/19 2229)  iohexol (OMNIPAQUE) 350 MG/ML injection 50 mL (50 mLs Intravenous Contrast Given 07/01/19 2057)  aspirin tablet 325 mg (325 mg Oral Given 07/01/19 2229)  heparin bolus via  infusion 4,000 Units (4,000 Units Intravenous Bolus from Bag 07/01/19 2227)    ED Course  I have reviewed the triage vital signs and the nursing notes.  Pertinent labs & imaging results that were available during my care of the patient were reviewed by me and considered in my medical decision making (see chart for details).    MDM Rules/Calculators/A&P                      YANIS LARIN is a 86 y.o. male here presenting with weakness.  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  Patient is diffusely weak and unable to get up.  Consider another stroke versus bleed.  Of note, patient had troponin drawn in triage and was very elevated but has no chest pain or shortness of breath.  Reviewed EKG with Dr. [name] from cardiology.  He recommended repeating troponin.          10 pm  Repeat troponin is greater than 27,000.  We interrogated the pacemaker and he had several beats of V. tach yesterday. Cardiology was consulted and recommend heparin and will see patient.  Patient is back in sinus rhythm so do they do not recommend any amiodarone.  Patient is also in rhabdo as well.  11:23 PM CTA head and neck showed no obvious LVO or bleed.  Plan for left heart cath in the morning and echo.  Since patient has dementia and multiple medical problems, cardiology recommend medicine admission.   Final Clinical Impression(s) / ED Diagnoses Final diagnoses:  NSTEMI (non-ST elevated myocardial infarction) [facility])    Rx / Haven Orders ED Discharge Orders    None       Drenda Freeze, MD 07/01/19 2323

## 2019-07-01 NOTE — ED Triage Notes (Signed)
The pt arrived  By [facility]  From home the pt slipped and fell getting out of bed today the pts family told ems he had been weak and confused for 2 days

## 2019-07-02 ENCOUNTER — Other Ambulatory Visit: Payer: Self-pay

## 2019-07-02 ENCOUNTER — Inpatient Hospital Stay (HOSPITAL_COMMUNITY): Payer: Medicare HMO

## 2019-07-02 DIAGNOSIS — N1831 Chronic kidney disease, stage 3a: Secondary | ICD-10-CM

## 2019-07-02 DIAGNOSIS — I214 Non-ST elevation (NSTEMI) myocardial infarction: Principal | ICD-10-CM

## 2019-07-02 DIAGNOSIS — F0280 Dementia in other diseases classified elsewhere without behavioral disturbance: Secondary | ICD-10-CM

## 2019-07-02 DIAGNOSIS — G309 Alzheimer's disease, unspecified: Secondary | ICD-10-CM

## 2019-07-02 DIAGNOSIS — R748 Abnormal levels of other serum enzymes: Secondary | ICD-10-CM

## 2019-07-02 DIAGNOSIS — N179 Acute kidney failure, unspecified: Secondary | ICD-10-CM

## 2019-07-02 LAB — VITAMIN B12: Vitamin B-12: 957 pg/mL — ABNORMAL HIGH (ref 180–914)

## 2019-07-02 LAB — BASIC METABOLIC PANEL
Anion gap: 7 (ref 5–15)
BUN: 18 mg/dL (ref 8–23)
CO2: 19 mmol/L — ABNORMAL LOW (ref 22–32)
Calcium: 8.8 mg/dL — ABNORMAL LOW (ref 8.9–10.3)
Chloride: 115 mmol/L — ABNORMAL HIGH (ref 98–111)
Creatinine, Ser: 1.37 mg/dL — ABNORMAL HIGH (ref 0.61–1.24)
GFR calc Af Amer: 54 mL/min — ABNORMAL LOW (ref >60)
GFR calc non Af Amer: 47 mL/min — ABNORMAL LOW (ref >60)
Glucose, Bld: 120 mg/dL — ABNORMAL HIGH (ref 70–99)
Potassium: 4.3 mmol/L (ref 3.5–5.1)
Sodium: 141 mmol/L (ref 135–145)

## 2019-07-02 LAB — CBC
HCT: 34.4 % — ABNORMAL LOW (ref 39.0–52.0)
Hemoglobin: 11.2 g/dL — ABNORMAL LOW (ref 13.0–17.0)
MCH: 28.4 pg (ref 26.0–34.0)
MCHC: 32.6 g/dL (ref 30.0–36.0)
MCV: 87.1 fL (ref 80.0–100.0)
Platelets: 177 10*3/uL (ref 150–400)
RBC: 3.95 MIL/uL — ABNORMAL LOW (ref 4.22–5.81)
RDW: 15.1 % (ref 11.5–15.5)
WBC: 9.8 10*3/uL (ref 4.0–10.5)
nRBC: 0 % (ref 0.0–0.2)

## 2019-07-02 LAB — LIPID PANEL
Cholesterol: 105 mg/dL (ref 0–200)
HDL: 31 mg/dL — ABNORMAL LOW (ref >40)
LDL Cholesterol: 66 mg/dL (ref 0–99)
Total CHOL/HDL Ratio: 3.4 RATIO
Triglycerides: 38 mg/dL (ref <150)
VLDL: 8 mg/dL (ref 0–40)

## 2019-07-02 LAB — TROPONIN I (HIGH SENSITIVITY): Troponin I (High Sensitivity): >27000 ng/L (ref <18)

## 2019-07-02 LAB — ECHOCARDIOGRAM COMPLETE
Height: 75 in
Weight: 2747.81 oz

## 2019-07-02 LAB — SARS CORONAVIRUS 2 BY RT PCR (HOSPITAL ORDER, PERFORMED IN ~~LOC~~ HOSPITAL LAB): SARS Coronavirus 2: NEGATIVE

## 2019-07-02 LAB — FOLATE: Folate: 24.1 ng/mL (ref >5.9)

## 2019-07-02 LAB — TSH: TSH: 0.317 u[IU]/mL — ABNORMAL LOW (ref 0.350–4.500)

## 2019-07-02 LAB — MAGNESIUM: Magnesium: 1.8 mg/dL (ref 1.7–2.4)

## 2019-07-02 LAB — HEPARIN LEVEL (UNFRACTIONATED): Heparin Unfractionated: 0.43 IU/mL (ref 0.30–0.70)

## 2019-07-02 MED ORDER — CARBIDOPA-LEVODOPA 25-100 MG PO TABS
1.0000 | ORAL_TABLET | Freq: Every day | ORAL | Status: DC
  Administered 2019-07-02 – 2019-07-04 (×3): 1 via ORAL
  Filled 2019-07-02 (×4): qty 1

## 2019-07-02 MED ORDER — OMEGA-3-ACID ETHYL ESTERS 1 G PO CAPS
1000.0000 mg | ORAL_CAPSULE | Freq: Every day | ORAL | Status: DC
  Administered 2019-07-02: 1000 mg via ORAL
  Filled 2019-07-02 (×2): qty 1

## 2019-07-02 MED ORDER — ADULT MULTIVITAMIN W/MINERALS CH
1.0000 | ORAL_TABLET | Freq: Every day | ORAL | Status: DC
  Administered 2019-07-02: 1 via ORAL
  Filled 2019-07-02 (×2): qty 1

## 2019-07-02 MED ORDER — PERFLUTREN LIPID MICROSPHERE
1.0000 mL | INTRAVENOUS | Status: AC | PRN
  Administered 2019-07-02: 2 mL via INTRAVENOUS
  Filled 2019-07-02: qty 10

## 2019-07-02 MED ORDER — METOPROLOL TARTRATE 12.5 MG HALF TABLET
12.5000 mg | ORAL_TABLET | Freq: Two times a day (BID) | ORAL | Status: DC
  Administered 2019-07-02 – 2019-07-04 (×4): 12.5 mg via ORAL
  Filled 2019-07-02 (×5): qty 1

## 2019-07-02 MED ORDER — SODIUM CHLORIDE 0.9 % WEIGHT BASED INFUSION: 1.0000 mL/kg/h | INTRAVENOUS | Status: DC

## 2019-07-02 MED ORDER — UMECLIDINIUM-VILANTEROL 62.5-25 MCG/INH IN AEPB
2.0000 | INHALATION_SPRAY | Freq: Every day | RESPIRATORY_TRACT | Status: DC
  Filled 2019-07-02: qty 14

## 2019-07-02 MED ORDER — SODIUM CHLORIDE 0.9 % WEIGHT BASED INFUSION
3.0000 mL/kg/h | INTRAVENOUS | Status: AC
  Administered 2019-07-03: 3 mL/kg/h via INTRAVENOUS

## 2019-07-02 MED ORDER — ATORVASTATIN CALCIUM 40 MG PO TABS
40.0000 mg | ORAL_TABLET | Freq: Every day | ORAL | Status: DC
  Administered 2019-07-02 – 2019-07-04 (×3): 40 mg via ORAL
  Filled 2019-07-02 (×3): qty 1

## 2019-07-02 MED ORDER — SODIUM CHLORIDE 0.9 % IV SOLN: 250.0000 mL | INTRAVENOUS | Status: DC | PRN

## 2019-07-02 MED ORDER — UMECLIDINIUM-VILANTEROL 62.5-25 MCG/INH IN AEPB
2.0000 | INHALATION_SPRAY | Freq: Every day | RESPIRATORY_TRACT | Status: DC
  Administered 2019-07-04: 2 via RESPIRATORY_TRACT
  Filled 2019-07-02: qty 14

## 2019-07-02 MED ORDER — ASPIRIN EC 81 MG PO TBEC
81.0000 mg | DELAYED_RELEASE_TABLET | Freq: Every day | ORAL | Status: DC
  Administered 2019-07-02: 81 mg via ORAL
  Filled 2019-07-02: qty 1

## 2019-07-02 MED ORDER — VALACYCLOVIR HCL 500 MG PO TABS
1000.0000 mg | ORAL_TABLET | Freq: Every day | ORAL | Status: DC
  Administered 2019-07-02 – 2019-07-04 (×3): 1000 mg via ORAL
  Filled 2019-07-02 (×4): qty 2

## 2019-07-02 MED ORDER — SODIUM CHLORIDE 0.9% FLUSH
3.0000 mL | Freq: Two times a day (BID) | INTRAVENOUS | Status: DC
  Administered 2019-07-03 – 2019-07-04 (×2): 3 mL via INTRAVENOUS

## 2019-07-02 MED ORDER — VITAMIN D 25 MCG (1000 UNIT) PO TABS
5000.0000 [IU] | ORAL_TABLET | Freq: Every day | ORAL | Status: DC
  Administered 2019-07-02: 5000 [IU] via ORAL
  Filled 2019-07-02 (×4): qty 5

## 2019-07-02 MED ORDER — ZINC GLUCONATE 50 MG PO TABS: 50.0000 mg | ORAL_TABLET | Freq: Every day | ORAL | Status: DC

## 2019-07-02 MED ORDER — SODIUM CHLORIDE 0.9% FLUSH: 3.0000 mL | INTRAVENOUS | Status: DC | PRN

## 2019-07-02 MED ORDER — ASCORBIC ACID 500 MG PO TABS
1000.0000 mg | ORAL_TABLET | Freq: Every day | ORAL | Status: DC
  Administered 2019-07-02: 1000 mg via ORAL
  Filled 2019-07-02 (×2): qty 2

## 2019-07-02 MED ORDER — ASPIRIN 81 MG PO CHEW
81.0000 mg | CHEWABLE_TABLET | ORAL | Status: AC
  Administered 2019-07-03: 81 mg via ORAL
  Filled 2019-07-02: qty 1

## 2019-07-02 MED ORDER — ALBUTEROL SULFATE (2.5 MG/3ML) 0.083% IN NEBU: 2.5000 mg | INHALATION_SOLUTION | Freq: Four times a day (QID) | RESPIRATORY_TRACT | Status: DC | PRN

## 2019-07-02 MED ORDER — FOLIC ACID 1 MG PO TABS
1.0000 mg | ORAL_TABLET | Freq: Every day | ORAL | Status: DC
  Administered 2019-07-02: 1 mg via ORAL
  Filled 2019-07-02 (×2): qty 1

## 2019-07-02 MED ORDER — PREGABALIN 25 MG PO CAPS
25.0000 mg | ORAL_CAPSULE | Freq: Every day | ORAL | Status: DC
  Administered 2019-07-02 – 2019-07-04 (×3): 25 mg via ORAL
  Filled 2019-07-02 (×3): qty 1

## 2019-07-02 NOTE — Consult Note (Signed)
WOC Nurse Consult Note: Reason for Consult:Moisture associated skin damage to scrotal/inguinal fold.  Wound type:partial thickness skin injury Pressure Injury POA: Yes Measurement: 0.3 cm x 2 cm scar with no return of melanin and 0.3 cm nonintact center. Wound EKI:YJGZ and moist Drainage (amount, consistency, odor) none Periwound:scarring from previous insult Dressing procedure/placement/frequency:Cleanse left scrotal skin with soap and water and pat dry.  Apply Interdry skin fold: Measure and cut length of InterDry to fit in skin folds that have skin breakdown Tuck InterDry fabric into skin folds in a single layer, allow for 2 inches of overhang from skin edges to allow for wicking to occur May remove to bathe; dry area thoroughly and then tuck into affected areas again Do not apply any creams or ointments when using InterDry DO NOT THROW AWAY FOR 5 DAYS unless soiled with stool DO NOT [facility] product, this will inactivate the silver in the material  New sheet of Interdry should be applied after 5 days of use if patient continues to have skin breakdown  Will not follow at this time.  Please re-consult if needed.  Domenic Moras MSN, RN, FNP-BC CWON Wound, Ostomy, Continence Nurse Pager [phone]

## 2019-07-02 NOTE — Progress Notes (Signed)
Attempted to find out time for MRI of the head per MRI the tech needed to perform MRI would be in 07/03/2019 at 0900.  Theodosia Paling, PA, Dr. Leonel Ramsay, Dr. Audie Box notified.  Per Dr. Audie Box MRI must be done before cardiac cath can be completed.  Spoke to Young in MRI, she stated she marked patient to be done first in the morning.  Will notified 6E charge nurse to follow up in the AM.

## 2019-07-02 NOTE — Progress Notes (Addendum)
PROGRESS NOTE   Derek STTHOMAS  ZOX:[id number]    DOB: [birth year]    DOA: 07/01/2019  PCP: [name], MD   I have briefly reviewed patients previous medical records in [facility].  Chief Complaint  Patient presents with  . Weakness    Brief Narrative:  86 year old married male, daughter lives with them, ambulates with the help of a walker, PMH of hypertension, hyperlipidemia, prostate cancer, orthostatic hypotension, OSA on CPAP, TIAs, neuropathy, neuro degenerative disorder with fluctuating cognition/bradykinesia and tremor (follows with OP neurology), AAA, sinus node dysfunction s/p PPM, stage IIIa CKD, ongoing tobacco abuse,?  Sjogren's, presented to [facility] ED with complaints of acute on chronic mental status changes and profound weakness which started 48 hours prior to admission, blurred bilateral vision and fall on morning of admission with associated worsened slurred speech and right lower extremity weakness. Has chronic dyspnea.  No chest pain reported.  In the ED, lab work showed HS troponin >27K, CK 2554, EKG showed subtle ST elevations in lead I, aVF, V4 through V6 but did not meet criteria for STEMI.  Hemoglobin 12.3, glucose 162, creatinine 1.59.  [employer] pacemaker showed nonsustained VT on 6/2.  CT head, CTA head and neck were negative for large vessel occlusion or other acute vascular abnormalities.  Multifocal moderate to severe stenosis involving the intracranial circulation.  Admitted for non-STEMI, elevated CK, acute on stage IIIa CKD, acute metabolic encephalopathy.  Cardiology and neurology consulted.  Checking echo today and plan for cardiac cath tomorrow.  IV heparin initiated.   Assessment & Plan:  Principal Problem:   NSTEMI (non-ST elevated myocardial infarction) ([place]) Active Problems:   CSA (central sleep apnea)   Alzheimer disease (HCC)   CKD (chronic kidney disease), stage III   AKI (acute kidney injury) ([place])   Elevated CK   NSTEMI: Cardiology  consulted.  Follow-up appreciated.  Discussed with Dr. Audie Box.  Suspected large NSTEMI in LCx territory.  Continue iron aspirin and heparin drip.  Added metoprolol 12.5 mg twice daily.  Continue atorvastatin 40 daily daily.  TTE: LVEF: 40-45%.  Severe akinesis of the left ventricular, entire inferior wall, inferolateral wall and lateral wall suggestive of LCx territory infarct.  Grade 1 DD.  Cough chest pain.  Hemodynamically stable.  Elevated CK likely due to this.  Acute on stage IIIa chronic kidney disease Baseline creatinine unclear but may be in the 1.2-1.3.  Presented with creatinine of 1.59 which has improved to 1.37.  Follow BMP in a.m.  Avoid nephrotoxic's.  Gentle IV fluids precath.  Acute metabolic encephalopathy complicating neurocognitive decline: Unclear etiology.  Daughter at bedside indicates that his current mental status is clearly way office baseline.  She states that his cognitive impairment is "mild" although suspect that it is more than that.  She indicates that there was clearly a change to her's 2 days PTA.  Also describes some strokelike symptoms.  CT head negative for acute findings.  Consulted Neuro Hospitalist, confirmed PPM is MRI compatible and ordered MRI brain.  If stroke ruled out, could also be progressive neurocognitive decline.  Neurology checking labs (TSH, B12, thiamine, copper, SPEP).  Continue Sinemet which seems to have helped him.  Hyperlipidemia Continue statins  OSA on CPAP  Hypertension Controlled.  Peripheral neuropathy Likely cause for his gait issues and falls.  COPD/Tobacco abuse 50-year smoking history.  Used to smoke up to 2 packs/day, down to 4 cigarettes/day.  Continue Anoro Ellipta.  As needed albuterol.  Body mass index is 21.47  kg/m.   ?  Sjogren's ?  Plaquenil for this.  Currently on hold.  As per daughter's request, PMT consulted for [place] discussion and speech therapy for dysphagia evaluation.   DVT prophylaxis: Heparin  drip Code Status: Full Family Communication: Discussed in detail with patient's daughter at bedside, updated care and answered questions. Disposition:  Status is: Inpatient  Remains inpatient appropriate because:Inpatient level of care appropriate due to severity of illness   Dispo: The patient is from: Home              Anticipated d/c is to: Home              Anticipated d/c date is: 2 days              Patient currently is not medically stable to d/c.        Consultants:   Cardiology Neurology  Procedures:   None  Antimicrobials:   None   Subjective:  Patient interviewed and examined along with daughter at bedside.  Never had chest pain through this episode.  Chronic dyspnea which is unchanged.  Daughter voices mental status is still way off baseline.  She also noticed worsening slurred speech and right-sided weakness.  Patient himself is a poor historian.  Objective:   Vitals:   07/02/19 0700 07/02/19 0805 07/02/19 1122 07/02/19 1325  BP: 121/81  115/75 107/70  Pulse: 77 76 75 79  Resp: 20 18  19   Temp:  97.8 F (36.6 C)  (!) 97.5 F (36.4 C)  TempSrc:  Axillary  Oral  SpO2: 97% 97%  98%  Weight:      Height:        General exam: Elderly male, moderately built and nourished lying comfortably propped up in bed without distress. Respiratory system: Clear to auscultation. Respiratory effort normal. Cardiovascular system: S1 & S2 heard, RRR. No JVD, murmurs, rubs, gallops or clicks. No pedal edema.  Telemetry personally reviewed: Sinus rhythm. Gastrointestinal system: Abdomen is nondistended, soft and nontender. No organomegaly or masses felt. Normal bowel sounds heard. Central nervous system: Alert and oriented x2.  Subtle right-sided facial weakness and dysarthria. Extremities: Symmetric 5 x 5 power in upper extremities.  Grade 5 x 5 power in left lower extremity.  Grade 4 x 5 power in right lower extremity. Skin: No rashes, lesions or ulcers Psychiatry:  Judgement and insight appear impaired. Mood & affect flat.     Data Reviewed:   I have personally reviewed following labs and imaging studies   CBC: Recent Labs  Lab 07/01/19 1905 07/02/19 0445  WBC 7.9 9.8  HGB 12.3* 11.2*  HCT 38.7* 34.4*  MCV 88.6 87.1  PLT 188 269    Basic Metabolic Panel: Recent Labs  Lab 07/01/19 1905 07/01/19 2320 07/02/19 0445  NA 138  --  141  K 4.0  --  4.3  CL 108  --  115*  CO2 20*  --  19*  GLUCOSE 162*  --  120*  BUN 18  --  18  CREATININE 1.59*  --  1.37*  CALCIUM 9.3  --  8.8*  MG  --  1.8  --     Liver Function Tests: No results for input(s): AST, ALT, ALKPHOS, BILITOT, PROT, ALBUMIN in the last 168 hours.  CBG: No results for input(s): GLUCAP in the last 168 hours.  Microbiology Studies:   Recent Results (from the past 240 hour(s))  SARS Coronavirus 2 by RT PCR (hospital order, performed in [facility] hospital lab) Nasopharyngeal Nasopharyngeal Swab     Status: None   Collection Time: 07/01/19 11:55 PM   Specimen: Nasopharyngeal Swab  Result Value Ref Range Status   SARS Coronavirus 2 NEGATIVE NEGATIVE Final    Comment: (NOTE) SARS-CoV-2 target nucleic acids are NOT DETECTED. The SARS-CoV-2 RNA is generally detectable in upper and lower respiratory specimens during the acute phase of infection. The lowest concentration of SARS-CoV-2 viral copies this assay can detect is 250 copies / mL. A negative result does not preclude SARS-CoV-2 infection and should not be used as the sole basis for treatment or other patient management decisions.  A negative result may occur with improper specimen collection / handling, submission of specimen other than nasopharyngeal swab, presence of viral mutation(s) within the areas targeted by this assay, and inadequate number of viral copies (<250 copies / mL). A negative result must be combined with clinical observations, patient history, and epidemiological information. Fact Sheet for  Patients:   StrictlyIdeas.no Fact Sheet for Healthcare Providers: BankingDealers.co.za This test is not yet approved or cleared  by the [place] FDA and has been authorized for detection and/or diagnosis of SARS-CoV-2 by FDA under an Emergency Use Authorization (EUA).  This EUA will remain in effect (meaning this test can be used) for the duration of the COVID-19 declaration under Section 564(b)(1) of the Act, 21 U.S.C. section 360bbb-3(b)(1), unless the authorization is terminated or revoked sooner. Performed at [facility], [place] [address], [place], [place] [address]      Radiology Studies:  CT Angio Head W or Wo Contrast  Result Date: 07/01/2019 CLINICAL DATA:  Initial evaluation for acute trauma, fall, weakness, confusion. EXAM: CT ANGIOGRAPHY HEAD AND NECK TECHNIQUE: Multidetector CT imaging of the head and neck was performed using the standard protocol during bolus administration of intravenous contrast. Multiplanar CT image reconstructions and MIPs were obtained to evaluate the vascular anatomy. Carotid stenosis measurements (when applicable) are obtained utilizing NASCET criteria, using the distal internal carotid diameter as the denominator. CONTRAST:  24mL OMNIPAQUE IOHEXOL 350 MG/ML SOLN COMPARISON:  Prior CT from 03/02/2017. FINDINGS: CT HEAD FINDINGS Brain: Generalized age-related cerebral atrophy with chronic small vessel ischemic disease, stable. No acute intracranial hemorrhage. No acute large vessel territory infarct. No mass lesion, midline shift or mass effect. No hydrocephalus or extra-axial fluid collection. Vascular: No hyperdense vessel. Calcified atherosclerosis present at skull base. Skull: Scalp soft tissues demonstrate no acute finding. Calvarium intact. Sinuses: Mild scattered mucosal thickening noted within the ethmoidal air cells. Small amount of pneumatized secretions present within the left sphenoid sinus.  Small right mastoid effusion noted, of doubtful significance. Orbits: Globes and orbital soft tissues demonstrate no acute finding. Review of the MIP images confirms the above findings CTA NECK FINDINGS Aortic arch: Visualized aortic arch of normal caliber with normal 3 vessel morphology. Moderate atherosclerotic change about the arch and origin of the great vessels without flow-limiting stenosis. Right carotid system: Right common and internal carotid arteries patent without stenosis, dissection or occlusion. Mild eccentric calcified plaque at the right bifurcation without stenosis. Left carotid system: Left common and internal carotid arteries widely patent without stenosis, dissection, or occlusion. No significant atheromatous narrowing about the left bifurcation. Vertebral arteries: Both vertebral arteries arise from the subclavian arteries. No proximal subclavian stenosis. Vertebral arteries patent within the neck without stenosis, dissection, or occlusion. Skeleton: No visible acute osseous abnormality. No discrete or worrisome osseous lesions. Moderate to advanced degenerative spondylosis noted at C3-4 through C7-T1. Other  neck: No other acute soft tissue abnormality within the neck. No mass lesion or adenopathy. Upper chest: Left-sided pacemaker/AICD noted. Dependent atelectatic changes noted within the visualized right lung. Underlying centrilobular emphysema. Review of the MIP images confirms the above findings CTA HEAD FINDINGS Anterior circulation: Petrous segments patent bilaterally. Scattered calcified atheromatous plaque throughout the cavernous/supraclinoid ICAs with associated mild to moderate multifocal narrowing (no greater than 50%). A1 segments patent bilaterally. Right A1 slightly hypoplastic. Normal anterior communicating artery complex. Anterior cerebral arteries widely patent at the proximal and mid aspects. Short-segment moderate to severe distal right ACA/pericallosal stenosis noted  (series 14, image 13). M1 segments patent bilaterally. Normal MCA bifurcations. Focal moderate stenosis at the left MCA bifurcation at the origins of both superior and inferior divisions (series 14, image 19). Right MCA bifurcation within normal limits. Distal MCA branches well perfused and symmetric. Posterior circulation: Scattered atheromatous plaque within the bilateral V4 segments without high-grade stenosis, right slightly worse than left. Both picas patent. Basilar patent to its distal aspect without stenosis. Superior cerebral arteries patent bilaterally. Both PCAs primarily supplied via the basilar. Severe multifocal stenoses seen involving the left P2 segment (series 14, image 20). PCAs otherwise well perfused to their distal aspects. Venous sinuses: Grossly patent allowing for timing the contrast bolus. Anatomic variants: None significant.  No intracranial aneurysm. Review of the MIP images confirms the above findings IMPRESSION: CT HEAD IMPRESSION: 1. No acute intracranial abnormality identified. 2. Age-related cerebral atrophy with mild to moderate chronic microvascular ischemic disease. CTA HEAD AND NECK IMPRESSION: 1. Negative CTA for large vessel occlusion or other acute vascular abnormality. 2. Multifocal moderate to severe stenoses involving the intracranial circulation as above including moderate to severe right pericallosal stenosis, moderate left MCA bifurcation stenosis, with severe multifocal left P2 stenoses. 3. Additional mild-to-moderate atheromatous change about the aortic arch and carotid siphons without high-grade stenosis. Continued wide patency of the major arterial vasculature within the neck. 4.  Emphysema (ICD10-J43.9). Electronically Signed   By: Jeannine Boga M.D.   On: 07/01/2019 23:10   DG Chest 2 View  Result Date: 07/01/2019 CLINICAL DATA:  Weakness with slip and fall. EXAM: CHEST - 2 VIEW COMPARISON:  September 17, 2017 FINDINGS: There is a dual lead AICD. A trace  amount of atelectasis is noted within the bilateral lung bases. There is no evidence of a pleural effusion or pneumothorax. The heart size and mediastinal contours are within normal limits. Multilevel degenerative changes seen throughout the thoracic spine. IMPRESSION: Trace amount of bibasilar atelectasis without acute or active cardiopulmonary disease. Electronically Signed   By: Virgina Norfolk M.D.   On: 07/01/2019 19:28   CT Angio Neck W and/or Wo Contrast  Result Date: 07/01/2019 CLINICAL DATA:  Initial evaluation for acute trauma, fall, weakness, confusion. EXAM: CT ANGIOGRAPHY HEAD AND NECK TECHNIQUE: Multidetector CT imaging of the head and neck was performed using the standard protocol during bolus administration of intravenous contrast. Multiplanar CT image reconstructions and MIPs were obtained to evaluate the vascular anatomy. Carotid stenosis measurements (when applicable) are obtained utilizing NASCET criteria, using the distal internal carotid diameter as the denominator. CONTRAST:  24mL OMNIPAQUE IOHEXOL 350 MG/ML SOLN COMPARISON:  Prior CT from 03/02/2017. FINDINGS: CT HEAD FINDINGS Brain: Generalized age-related cerebral atrophy with chronic small vessel ischemic disease, stable. No acute intracranial hemorrhage. No acute large vessel territory infarct. No mass lesion, midline shift or mass effect. No hydrocephalus or extra-axial fluid collection. Vascular: No hyperdense vessel. Calcified atherosclerosis present at skull base. Skull:  Scalp soft tissues demonstrate no acute finding. Calvarium intact. Sinuses: Mild scattered mucosal thickening noted within the ethmoidal air cells. Small amount of pneumatized secretions present within the left sphenoid sinus. Small right mastoid effusion noted, of doubtful significance. Orbits: Globes and orbital soft tissues demonstrate no acute finding. Review of the MIP images confirms the above findings CTA NECK FINDINGS Aortic arch: Visualized aortic arch  of normal caliber with normal 3 vessel morphology. Moderate atherosclerotic change about the arch and origin of the great vessels without flow-limiting stenosis. Right carotid system: Right common and internal carotid arteries patent without stenosis, dissection or occlusion. Mild eccentric calcified plaque at the right bifurcation without stenosis. Left carotid system: Left common and internal carotid arteries widely patent without stenosis, dissection, or occlusion. No significant atheromatous narrowing about the left bifurcation. Vertebral arteries: Both vertebral arteries arise from the subclavian arteries. No proximal subclavian stenosis. Vertebral arteries patent within the neck without stenosis, dissection, or occlusion. Skeleton: No visible acute osseous abnormality. No discrete or worrisome osseous lesions. Moderate to advanced degenerative spondylosis noted at C3-4 through C7-T1. Other neck: No other acute soft tissue abnormality within the neck. No mass lesion or adenopathy. Upper chest: Left-sided pacemaker/AICD noted. Dependent atelectatic changes noted within the visualized right lung. Underlying centrilobular emphysema. Review of the MIP images confirms the above findings CTA HEAD FINDINGS Anterior circulation: Petrous segments patent bilaterally. Scattered calcified atheromatous plaque throughout the cavernous/supraclinoid ICAs with associated mild to moderate multifocal narrowing (no greater than 50%). A1 segments patent bilaterally. Right A1 slightly hypoplastic. Normal anterior communicating artery complex. Anterior cerebral arteries widely patent at the proximal and mid aspects. Short-segment moderate to severe distal right ACA/pericallosal stenosis noted (series 14, image 13). M1 segments patent bilaterally. Normal MCA bifurcations. Focal moderate stenosis at the left MCA bifurcation at the origins of both superior and inferior divisions (series 14, image 19). Right MCA bifurcation within normal  limits. Distal MCA branches well perfused and symmetric. Posterior circulation: Scattered atheromatous plaque within the bilateral V4 segments without high-grade stenosis, right slightly worse than left. Both picas patent. Basilar patent to its distal aspect without stenosis. Superior cerebral arteries patent bilaterally. Both PCAs primarily supplied via the basilar. Severe multifocal stenoses seen involving the left P2 segment (series 14, image 20). PCAs otherwise well perfused to their distal aspects. Venous sinuses: Grossly patent allowing for timing the contrast bolus. Anatomic variants: None significant.  No intracranial aneurysm. Review of the MIP images confirms the above findings IMPRESSION: CT HEAD IMPRESSION: 1. No acute intracranial abnormality identified. 2. Age-related cerebral atrophy with mild to moderate chronic microvascular ischemic disease. CTA HEAD AND NECK IMPRESSION: 1. Negative CTA for large vessel occlusion or other acute vascular abnormality. 2. Multifocal moderate to severe stenoses involving the intracranial circulation as above including moderate to severe right pericallosal stenosis, moderate left MCA bifurcation stenosis, with severe multifocal left P2 stenoses. 3. Additional mild-to-moderate atheromatous change about the aortic arch and carotid siphons without high-grade stenosis. Continued wide patency of the major arterial vasculature within the neck. 4.  Emphysema (ICD10-J43.9). Electronically Signed   By: Jeannine Boga M.D.   On: 07/01/2019 23:10   ECHOCARDIOGRAM COMPLETE  Result Date: 07/02/2019    ECHOCARDIOGRAM REPORT   Patient Name:   Derek Carr [facility] Date of Exam: 07/02/2019 Medical Rec #:  [id number]      Height:       75.0 in Accession #:    [id number]     Weight:       171.7  lb Date of Birth:  September 21, 1931     BSA:          2.057 m Patient Age:    66 years       BP:           137/80 mmHg Patient Gender: M              HR:           71 bpm. Exam Location:  Inpatient  Procedure: 2D Echo and Intracardiac Opacification Agent Indications:    Acute myocardial infarction 410  History:        Patient has prior history of Echocardiogram examinations, most                 recent 09/15/2017. Stroke; Risk Factors:Hypertension and                 Dyslipidemia. AAA. Past history of cancer.  Sonographer:    Darlina Sicilian RDCS Referring Phys: [phone] ADAM S BARNETT IMPRESSIONS  1. Left ventricular ejection fraction, by estimation, is 40 to 45%. The left ventricle has mildly decreased function. The left ventricle demonstrates regional wall motion abnormalities (see scoring diagram/findings for description). There is mild left ventricular hypertrophy. Left ventricular diastolic parameters are consistent with Grade I diastolic dysfunction (impaired relaxation). There is severe akinesis of the left ventricular, entire inferior wall, inferolateral wall and lateral wall, suggestive of LCx territory infarct.  2. Right ventricular systolic function is mildly reduced. The right ventricular size is normal. There is normal pulmonary artery systolic pressure.  3. The mitral valve is grossly normal. Trivial mitral valve regurgitation.  4. The aortic valve is tricuspid. Aortic valve regurgitation is not visualized. Comparison(s): Changes from prior study are noted. 09/15/2017: LVEF 55-60%. FINDINGS  Left Ventricle: Left ventricular ejection fraction, by estimation, is 40 to 45%. The left ventricle has mildly decreased function. The left ventricle demonstrates regional wall motion abnormalities. Severe akinesis of the left ventricular, entire inferior wall, inferolateral wall and lateral wall. Definity contrast agent was given IV to delineate the left ventricular endocardial borders. The left ventricular internal cavity size was normal in size. There is mild left ventricular hypertrophy. Left  ventricular diastolic parameters are consistent with Grade I diastolic dysfunction (impaired relaxation).  Indeterminate filling pressures. Right Ventricle: The right ventricular size is normal. No increase in right ventricular wall thickness. Right ventricular systolic function is mildly reduced. There is normal pulmonary artery systolic pressure. The tricuspid regurgitant velocity is 1.67 m/s, and with an assumed right atrial pressure of 8 mmHg, the estimated right ventricular systolic pressure is 82.9 mmHg. Left Atrium: Left atrial size was normal in size. Right Atrium: Right atrial size was normal in size. Pericardium: There is no evidence of pericardial effusion. Mitral Valve: The mitral valve is grossly normal. Trivial mitral valve regurgitation. Tricuspid Valve: The tricuspid valve is grossly normal. Tricuspid valve regurgitation is trivial. Aortic Valve: The aortic valve is tricuspid. Aortic valve regurgitation is not visualized. Pulmonic Valve: The pulmonic valve was not well visualized. Pulmonic valve regurgitation is not visualized. Aorta: The aortic root and ascending aorta are structurally normal, with no evidence of dilitation. IAS/Shunts: No atrial level shunt detected by color flow Doppler. Additional Comments: A pacer wire is visualized.  LEFT VENTRICLE PLAX 2D LVOT diam:     1.90 cm      Diastology LV SV:         52           LV e'  lateral:   6.74 cm/s LV SV Index:   25           LV E/e' lateral: 8.3 LVOT Area:     2.84 cm     LV e' medial:    4.35 cm/s                             LV E/e' medial:  12.9  LV Volumes (MOD) LV vol d, MOD A2C: 172.0 ml LV vol d, MOD A4C: 154.0 ml LV vol s, MOD A2C: 91.9 ml LV vol s, MOD A4C: 85.5 ml LV SV MOD A2C:     80.1 ml LV SV MOD A4C:     154.0 ml LV SV MOD BP:      74.8 ml RIGHT VENTRICLE RV S prime:     9.25 cm/s TAPSE (M-mode): 2.0 cm LEFT ATRIUM             Index LA Vol (A2C):   64.5 ml 31.36 ml/m LA Vol (A4C):   50.0 ml 24.31 ml/m LA Biplane Vol: 61.1 ml 29.71 ml/m  AORTIC VALVE LVOT Vmax:   105.00 cm/s LVOT Vmean:  68.200 cm/s LVOT VTI:    0.182 m  AORTA Ao  Root diam: 3.10 cm MITRAL VALVE               TRICUSPID VALVE MV Area (PHT): 4.68 cm    TR Peak grad:   11.2 mmHg MV Decel Time: 162 msec    TR Vmax:        167.00 cm/s MV E velocity: 56.20 cm/s MV A velocity: 74.40 cm/s  SHUNTS MV E/A ratio:  0.76        Systemic VTI:  0.18 m                            Systemic Diam: 1.90 cm Lyman Bishop MD Electronically signed by Lyman Bishop MD Signature Date/Time: 07/02/2019/11:44:17 AM    Final      Scheduled Meds:   . Derrill Memo ON 07/03/2019] aspirin  81 mg Oral Pre-Cath  . aspirin EC  81 mg Oral Daily  . atorvastatin  40 mg Oral Daily  . carbidopa-levodopa  1 tablet Oral Daily  . metoprolol tartrate  12.5 mg Oral BID  . [START ON 07/03/2019] sodium chloride flush  3 mL Intravenous Q12H  . valACYclovir  1,000 mg Oral Daily    Continuous Infusions:   . sodium chloride    . [START ON 07/03/2019] sodium chloride     Followed by  . [START ON 07/03/2019] sodium chloride    . heparin 850 Units/hr (07/01/19 2228)     LOS: 1 day     Vernell Leep, MD, [place], [facility]. [facility]    To contact the attending provider between 7A-7P or the covering provider during after hours 7P-7A, please log into the web site [email or web address] and access using universal [place] password for that web site. If you do not have the password, please call the hospital operator.  07/02/2019, 4:47 PM

## 2019-07-02 NOTE — Progress Notes (Signed)
RT offered pt CPAP dream station for the night and pt stated he did not want to wear it. Pt respiratory status is stable on RA w/no distress noted at this time. Pt resting comfortably. RT will continue to monitor.

## 2019-07-02 NOTE — Progress Notes (Signed)
Progress Note  Patient Name: Derek Carr Date of Encounter: 07/02/2019  [facility] Cardiologist: Cristopher Peru, MD   Subjective   Spoke to patient's daughter. She says the family would like and pursue cardiac cath. The patient remains chest pain free.   Inpatient Medications    Scheduled Meds: . aspirin EC  81 mg Oral Daily  . atorvastatin  40 mg Oral Daily  . carbidopa-levodopa  1 tablet Oral Daily  . valACYclovir  1,000 mg Oral Daily   Continuous Infusions: . heparin 850 Units/hr (07/01/19 2228)   PRN Meds: acetaminophen   Vital Signs    Vitals:   07/02/19 0300 07/02/19 0400 07/02/19 0500 07/02/19 0600  BP:      Pulse: 80 79 80 81  Resp: 18 (!) 22 16 (!) 25  Temp:      TempSrc:      SpO2: 98% 98% 98% 99%  Weight:      Height:        Intake/Output Summary (Last 24 hours) at 07/02/2019 0650 Last data filed at 07/02/2019 0600 Gross per 24 hour  Intake 1063.51 ml  Output 100 ml  Net 963.51 ml   Last 3 Weights 07/02/2019 07/01/2019 07/01/2019  Weight (lbs) 171 lb 11.8 oz 164 lb 175 lb 0.7 oz  Weight (kg) 77.9 kg 74.39 kg 79.4 kg      Telemetry    NSR, HR 80s - Personally Reviewed  ECG    NSR, RBBB, ST depression V1 and V2, subtle STE - Personally Reviewed  Physical Exam   GEN: No acute distress.   Neck: No JVD Cardiac: RRR, no murmurs, rubs, or gallops.  Respiratory: Clear to auscultation bilaterally. GI: Soft, nontender, non-distended  MS: No edema; No deformity. Neuro:  Nonfocal  Psych: Normal affect   Labs    High Sensitivity Troponin:   Recent Labs  Lab 07/01/19 1905 07/01/19 2040 07/01/19 2240  TROPONINIHS >27,000* >27,000* >27,000*      Chemistry Recent Labs  Lab 07/01/19 1905 07/02/19 0445  NA 138 141  K 4.0 4.3  CL 108 115*  CO2 20* 19*  GLUCOSE 162* 120*  BUN 18 18  CREATININE 1.59* 1.37*  CALCIUM 9.3 8.8*  GFRNONAA 39* 47*  GFRAA 45* 54*  ANIONGAP 10 7     Hematology Recent Labs  Lab 07/01/19 1905  07/02/19 0445  WBC 7.9 9.8  RBC 4.37 3.95*  HGB 12.3* 11.2*  HCT 38.7* 34.4*  MCV 88.6 87.1  MCH 28.1 28.4  MCHC 31.8 32.6  RDW 15.3 15.1  PLT 188 177    BNPNo results for input(s): BNP, PROBNP in the last 168 hours.   DDimer No results for input(s): DDIMER in the last 168 hours.   Radiology    CT Angio Head W or Wo Contrast  Result Date: 07/01/2019 CLINICAL DATA:  Initial evaluation for acute trauma, fall, weakness, confusion. EXAM: CT ANGIOGRAPHY HEAD AND NECK TECHNIQUE: Multidetector CT imaging of the head and neck was performed using the standard protocol during bolus administration of intravenous contrast. Multiplanar CT image reconstructions and MIPs were obtained to evaluate the vascular anatomy. Carotid stenosis measurements (when applicable) are obtained utilizing NASCET criteria, using the distal internal carotid diameter as the denominator. CONTRAST:  81mL OMNIPAQUE IOHEXOL 350 MG/ML SOLN COMPARISON:  Prior CT from 03/02/2017. FINDINGS: CT HEAD FINDINGS Brain: Generalized age-related cerebral atrophy with chronic small vessel ischemic disease, stable. No acute intracranial hemorrhage. No acute large vessel territory infarct. No mass lesion, midline  shift or mass effect. No hydrocephalus or extra-axial fluid collection. Vascular: No hyperdense vessel. Calcified atherosclerosis present at skull base. Skull: Scalp soft tissues demonstrate no acute finding. Calvarium intact. Sinuses: Mild scattered mucosal thickening noted within the ethmoidal air cells. Small amount of pneumatized secretions present within the left sphenoid sinus. Small right mastoid effusion noted, of doubtful significance. Orbits: Globes and orbital soft tissues demonstrate no acute finding. Review of the MIP images confirms the above findings CTA NECK FINDINGS Aortic arch: Visualized aortic arch of normal caliber with normal 3 vessel morphology. Moderate atherosclerotic change about the arch and origin of the great  vessels without flow-limiting stenosis. Right carotid system: Right common and internal carotid arteries patent without stenosis, dissection or occlusion. Mild eccentric calcified plaque at the right bifurcation without stenosis. Left carotid system: Left common and internal carotid arteries widely patent without stenosis, dissection, or occlusion. No significant atheromatous narrowing about the left bifurcation. Vertebral arteries: Both vertebral arteries arise from the subclavian arteries. No proximal subclavian stenosis. Vertebral arteries patent within the neck without stenosis, dissection, or occlusion. Skeleton: No visible acute osseous abnormality. No discrete or worrisome osseous lesions. Moderate to advanced degenerative spondylosis noted at C3-4 through C7-T1. Other neck: No other acute soft tissue abnormality within the neck. No mass lesion or adenopathy. Upper chest: Left-sided pacemaker/AICD noted. Dependent atelectatic changes noted within the visualized right lung. Underlying centrilobular emphysema. Review of the MIP images confirms the above findings CTA HEAD FINDINGS Anterior circulation: Petrous segments patent bilaterally. Scattered calcified atheromatous plaque throughout the cavernous/supraclinoid ICAs with associated mild to moderate multifocal narrowing (no greater than 50%). A1 segments patent bilaterally. Right A1 slightly hypoplastic. Normal anterior communicating artery complex. Anterior cerebral arteries widely patent at the proximal and mid aspects. Short-segment moderate to severe distal right ACA/pericallosal stenosis noted (series 14, image 13). M1 segments patent bilaterally. Normal MCA bifurcations. Focal moderate stenosis at the left MCA bifurcation at the origins of both superior and inferior divisions (series 14, image 19). Right MCA bifurcation within normal limits. Distal MCA branches well perfused and symmetric. Posterior circulation: Scattered atheromatous plaque within the  bilateral V4 segments without high-grade stenosis, right slightly worse than left. Both picas patent. Basilar patent to its distal aspect without stenosis. Superior cerebral arteries patent bilaterally. Both PCAs primarily supplied via the basilar. Severe multifocal stenoses seen involving the left P2 segment (series 14, image 20). PCAs otherwise well perfused to their distal aspects. Venous sinuses: Grossly patent allowing for timing the contrast bolus. Anatomic variants: None significant.  No intracranial aneurysm. Review of the MIP images confirms the above findings IMPRESSION: CT HEAD IMPRESSION: 1. No acute intracranial abnormality identified. 2. Age-related cerebral atrophy with mild to moderate chronic microvascular ischemic disease. CTA HEAD AND NECK IMPRESSION: 1. Negative CTA for large vessel occlusion or other acute vascular abnormality. 2. Multifocal moderate to severe stenoses involving the intracranial circulation as above including moderate to severe right pericallosal stenosis, moderate left MCA bifurcation stenosis, with severe multifocal left P2 stenoses. 3. Additional mild-to-moderate atheromatous change about the aortic arch and carotid siphons without high-grade stenosis. Continued wide patency of the major arterial vasculature within the neck. 4.  Emphysema (ICD10-J43.9). Electronically Signed   By: Jeannine Boga M.D.   On: 07/01/2019 23:10   DG Chest 2 View  Result Date: 07/01/2019 CLINICAL DATA:  Weakness with slip and fall. EXAM: CHEST - 2 VIEW COMPARISON:  September 17, 2017 FINDINGS: There is a dual lead AICD. A trace amount of atelectasis is noted  within the bilateral lung bases. There is no evidence of a pleural effusion or pneumothorax. The heart size and mediastinal contours are within normal limits. Multilevel degenerative changes seen throughout the thoracic spine. IMPRESSION: Trace amount of bibasilar atelectasis without acute or active cardiopulmonary disease. Electronically  Signed   By: Virgina Norfolk M.D.   On: 07/01/2019 19:28   CT Angio Neck W and/or Wo Contrast  Result Date: 07/01/2019 CLINICAL DATA:  Initial evaluation for acute trauma, fall, weakness, confusion. EXAM: CT ANGIOGRAPHY HEAD AND NECK TECHNIQUE: Multidetector CT imaging of the head and neck was performed using the standard protocol during bolus administration of intravenous contrast. Multiplanar CT image reconstructions and MIPs were obtained to evaluate the vascular anatomy. Carotid stenosis measurements (when applicable) are obtained utilizing NASCET criteria, using the distal internal carotid diameter as the denominator. CONTRAST:  23mL OMNIPAQUE IOHEXOL 350 MG/ML SOLN COMPARISON:  Prior CT from 03/02/2017. FINDINGS: CT HEAD FINDINGS Brain: Generalized age-related cerebral atrophy with chronic small vessel ischemic disease, stable. No acute intracranial hemorrhage. No acute large vessel territory infarct. No mass lesion, midline shift or mass effect. No hydrocephalus or extra-axial fluid collection. Vascular: No hyperdense vessel. Calcified atherosclerosis present at skull base. Skull: Scalp soft tissues demonstrate no acute finding. Calvarium intact. Sinuses: Mild scattered mucosal thickening noted within the ethmoidal air cells. Small amount of pneumatized secretions present within the left sphenoid sinus. Small right mastoid effusion noted, of doubtful significance. Orbits: Globes and orbital soft tissues demonstrate no acute finding. Review of the MIP images confirms the above findings CTA NECK FINDINGS Aortic arch: Visualized aortic arch of normal caliber with normal 3 vessel morphology. Moderate atherosclerotic change about the arch and origin of the great vessels without flow-limiting stenosis. Right carotid system: Right common and internal carotid arteries patent without stenosis, dissection or occlusion. Mild eccentric calcified plaque at the right bifurcation without stenosis. Left carotid system:  Left common and internal carotid arteries widely patent without stenosis, dissection, or occlusion. No significant atheromatous narrowing about the left bifurcation. Vertebral arteries: Both vertebral arteries arise from the subclavian arteries. No proximal subclavian stenosis. Vertebral arteries patent within the neck without stenosis, dissection, or occlusion. Skeleton: No visible acute osseous abnormality. No discrete or worrisome osseous lesions. Moderate to advanced degenerative spondylosis noted at C3-4 through C7-T1. Other neck: No other acute soft tissue abnormality within the neck. No mass lesion or adenopathy. Upper chest: Left-sided pacemaker/AICD noted. Dependent atelectatic changes noted within the visualized right lung. Underlying centrilobular emphysema. Review of the MIP images confirms the above findings CTA HEAD FINDINGS Anterior circulation: Petrous segments patent bilaterally. Scattered calcified atheromatous plaque throughout the cavernous/supraclinoid ICAs with associated mild to moderate multifocal narrowing (no greater than 50%). A1 segments patent bilaterally. Right A1 slightly hypoplastic. Normal anterior communicating artery complex. Anterior cerebral arteries widely patent at the proximal and mid aspects. Short-segment moderate to severe distal right ACA/pericallosal stenosis noted (series 14, image 13). M1 segments patent bilaterally. Normal MCA bifurcations. Focal moderate stenosis at the left MCA bifurcation at the origins of both superior and inferior divisions (series 14, image 19). Right MCA bifurcation within normal limits. Distal MCA branches well perfused and symmetric. Posterior circulation: Scattered atheromatous plaque within the bilateral V4 segments without high-grade stenosis, right slightly worse than left. Both picas patent. Basilar patent to its distal aspect without stenosis. Superior cerebral arteries patent bilaterally. Both PCAs primarily supplied via the basilar.  Severe multifocal stenoses seen involving the left P2 segment (series 14, image 20). PCAs otherwise well  perfused to their distal aspects. Venous sinuses: Grossly patent allowing for timing the contrast bolus. Anatomic variants: None significant.  No intracranial aneurysm. Review of the MIP images confirms the above findings IMPRESSION: CT HEAD IMPRESSION: 1. No acute intracranial abnormality identified. 2. Age-related cerebral atrophy with mild to moderate chronic microvascular ischemic disease. CTA HEAD AND NECK IMPRESSION: 1. Negative CTA for large vessel occlusion or other acute vascular abnormality. 2. Multifocal moderate to severe stenoses involving the intracranial circulation as above including moderate to severe right pericallosal stenosis, moderate left MCA bifurcation stenosis, with severe multifocal left P2 stenoses. 3. Additional mild-to-moderate atheromatous change about the aortic arch and carotid siphons without high-grade stenosis. Continued wide patency of the major arterial vasculature within the neck. 4.  Emphysema (ICD10-J43.9). Electronically Signed   By: Jeannine Boga M.D.   On: 07/01/2019 23:10    Cardiac Studies   Echo 2019 Study Conclusions   - Left ventricle: The cavity size was normal. Systolic function was  normal. The estimated ejection fraction was in the range of 55%  to 60%. Although no diagnostic regional wall motion abnormality  was identified, this possibility cannot be completely excluded on  the basis of this study. Doppler parameters are consistent with  abnormal left ventricular relaxation (grade 1 diastolic  dysfunction).  - Aortic valve: There was mild regurgitation.  - Left atrium: The atrium was mildly dilated.  - Pericardium, extracardiac: A trivial pericardial effusion was  identified.   Patient Profile     86 y.o. male with a hx of AAA, sinus node dysfunction s/p PPM, neurodegenerative disorder of unclear etiology (dementia,  gait imbalance, tremor, neuropathy), orthostatic hypotension, CKD stage 3, prostate cancer, TIAs who presents with weakness.  Assessment & Plan    NSTEMI - presented with confusion, fatigue, weakness but no chest pain. Difficult to get history given dementia - HS troponin elevated at >27,000 - EKG with subtle ST elevations and ST depression V1, V2 - IV heparin - Echo ordered. Echo in 2019 with EF 55-60%, G1DD, mild MR - BB held for h/o of hypotension - LDL 125 Jan 2020. Recheck lipid panel - creatinine 1.37  - Given renal insufficieny and mild dementia unsure patient is the best candidate for cath. However daughter says family wants to pursue especially since dementia is only mild. Of note patient has been chest pain free and is not very functional at baseline. Will defer further discussion to MD.   H/o of orthostatic hypotension - not on antihypertensives at baseline - pressures stable  For questions or updates, please contact [place] Please consult [email or web address] for contact info under        Signed, Analycia Khokhar Ninfa Meeker, PA-C  07/02/2019, 6:50 AM

## 2019-07-02 NOTE — Consult Note (Addendum)
Neurology Consultation  Reason for Consult: Confusion, weakness, falls Referring Physician: Modena Jansky, MD  CC: Patient states his chief complaint is his neuropathy and falls  History is obtained from: Mainly chart however some history was obtained from wife and patient  HPI: Derek TRUDO is a 86 y.o. male with a past medical history of prostate cancer, lumbar scoliosis, hypertension, hyperlipidemia, radiation therapy for prostate cancer, degenerative arthritis, anemia and now generalized weakness increased falls.  Neurology was asked to evaluate patient for the above chief complaint.  In talking to the wife patient for many months has been chair bound for the most part.  She states he will get up to go to dinner or food and/or the bathroom otherwise he pretty much is nonactive.  Over the past months he has been complaining of increased weakness.  The weakness he describes especially in his lower legs.  He perseverates on the fact that he has peripheral neuropathy which does cause significant pain in the lower feet and legs.  He does admit to increased falls especially over the weekend but when asked what happens the 2 things he focuses on are the pain from his neuropathy and also his legs giving out.  As far as his mental decline, wife has noted this mental decline over the past months.  She states it is gradual.  ED course  CT head-no acute intracranial abnormalities identified  CTA head neck-negative CTA for large vessel occlusions or other acute vascular abnormalities.  Multifocal moderate to severe stenosis involving the intracranial circulation as above including moderate to severe right pericallosal stenosis, moderate left MCA bifurcation stenosis, with severe multifocal left P2 stenosis.  Additional mild to moderate atheromatous change about the aortic arch and carotid siphons with high-grade stenosis.  Continued wide patency of the major arterial vasculature within the  neck.  Chart review-patient has last seen Dr. Delice Lesch on 05/03/2019.  At that time he presented due to slowed speech movements, weakness.  In her note she states he has a neurodegenerative disorder with fluctuating cognition, bradykinesia, tremor.  She had previously discussed dementia with Lewy bodies.  He had significant improvement with low-dose Sinemet as it decreased his tremor and also his fluctuating speech.  At that time he also reported numbness from his feet up to his knees to the point where he could not feel the floor.  He reported he tends to drag his left leg.  Patient has had an EMG/nerve conduction study done in 08/2016 which was abnormal with generalized sensory motor polyneuropathy, could not rule out superimposed bilateral lower lumbosacral polyradiculopathy.  At that time gait disturbance was most likely multifactorial from lumbar disc disease and neuropathy.  Also had neuropathy labs-B1, B12, TSH, copper, SPEP, IFE, folate and heavy metal screens which were all negative.  In 09/2018 patient had been seeing a rheumatologist and was diagnosed with Sjogren's syndrome.  Patient is to follow-up with Dr. Delice Lesch and 6 months following the 05/03/2019 appointment.   Past Medical History:  Diagnosis Date  . AAA (abdominal aortic aneurysm) ([place])   . Anemia   . Ankle fracture    left  . Aortic regurgitation   . Atherosclerosis   . Complication of anesthesia    CONFUSION - Pt's family very concerned about this  . DDD (degenerative disc disease)   . Degenerative arthritis   . Dermatitis, atopic ARMS AND LEGS  . Diverticulosis   . Erectile dysfunction   . Frequency of urination   . Glaucoma   .  History of asbestos exposure   . History of radiation therapy 8/19-13-10/18/11   prostate, 34 GY  . History of shingles 2012-- BILATERAL EYES--  NO RESIDUAL  . Hyperlipidemia   . Hypertension   . Inguinal hernia   . Lumbar scoliosis   . Nocturia   . OA (osteoarthritis) of hip RIGHT  . Prostate  cancer ([place]) 05/11/2011   bx=Adenocarcinoma,gleason3+4=7, 4+4=8,PSA=10.30volume=45.7cc  . Renal cyst    bilateral  . Smokers' cough ([place])   . Stroke ([facility])   . Thyroid cyst     Family History  Problem Relation Age of Onset  . Hypertension Mother   . Prostate cancer Brother        surgery/cured  . Lung cancer Brother        new primary/same brother   Social History:   reports that he has quit smoking. His smoking use included cigarettes. He has a 31.00 pack-year smoking history. He has never used smokeless tobacco. He reports that he does not drink alcohol or use drugs.  Medications  Current Facility-Administered Medications:  .  0.9 %  sodium chloride infusion, 250 mL, Intravenous, PRN, Furth, Cadence H, PA-C .  [START ON 07/03/2019] 0.9% sodium chloride infusion, 3 mL/kg/hr, Intravenous, Continuous **FOLLOWED BY** [START ON 07/03/2019] 0.9% sodium chloride infusion, 1 mL/kg/hr, Intravenous, Continuous, Furth, Cadence H, PA-C .  acetaminophen (TYLENOL) tablet 650 mg, 650 mg, Oral, Q6H PRN, Tu, Ching T, DO, 650 mg at 07/02/19 0551 .  [START ON 07/03/2019] aspirin chewable tablet 81 mg, 81 mg, Oral, Pre-Cath, Furth, Cadence H, PA-C .  aspirin EC tablet 81 mg, 81 mg, Oral, Daily, Chriss Czar, MD, 81 mg at 07/02/19 0844 .  atorvastatin (LIPITOR) tablet 40 mg, 40 mg, Oral, Daily, Chriss Czar, MD, 40 mg at 07/02/19 0845 .  carbidopa-levodopa (SINEMET IR) 25-100 MG per tablet immediate release 1 tablet, 1 tablet, Oral, Daily, Tu, Ching T, DO, 1 tablet at 07/02/19 0844 .  heparin ADULT infusion 100 units/mL (25000 units/258mL sodium chloride 0.45%), 850 Units/hr, Intravenous, Continuous, Kris Mouton, [facility], Last Rate: 8.5 mL/hr at 07/01/19 2228, 850 Units/hr at 07/01/19 2228 .  metoprolol tartrate (LOPRESSOR) tablet 12.5 mg, 12.5 mg, Oral, BID, O'Neal, Cassie Freer, MD, 12.5 mg at 07/02/19 1122 .  [START ON 07/03/2019] sodium chloride flush (NS) 0.9 % injection 3 mL, 3 mL, Intravenous, Q12H,  Furth, Cadence H, PA-C .  sodium chloride flush (NS) 0.9 % injection 3 mL, 3 mL, Intravenous, PRN, Furth, Cadence H, PA-C .  valACYclovir (VALTREX) tablet 1,000 mg, 1,000 mg, Oral, Daily, Tu, Ching T, DO, 1,000 mg at 07/02/19 0844  ROS:  General ROS: Positive for -  fatigue Psychological ROS: negative for - behavioral disorder, hallucinations, memory difficulties, mood swings or suicidal ideation Ophthalmic ROS: negative for - blurry vision, double vision ENT ROS: negative for - epistaxis, nasal discharge, oral lesions, sore throat, tinnitus or vertigo Allergy and Immunology ROS: negative for - hives or itchy/watery eyes Hematological and Lymphatic ROS: negative for - bleeding problems, bruising  Endocrine ROS: negative for -  polydipsia/polyuria or temperature intolerance Respiratory ROS: negative for - cough, hemoptysis, shortness of breath or wheezing Cardiovascular ROS: negative for - chest pain, dyspnea on exertion, edema or irregular heartbeat Gastrointestinal ROS: negative for - abdominal pain, diarrhea, hematemesis, nausea/vomiting or stool incontinence Genito-Urinary ROS: negative for - dysuria, hematuria, incontinence or urinary frequency/urgency Musculoskeletal ROS: Positive for -  muscular weakness Neurological ROS: as noted in HPI Dermatological ROS: negative for rash  and skin lesion changes  Exam: Current vital signs: BP 107/70 (BP Location: Right Arm)   Pulse 79   Temp (!) 97.5 F (36.4 C) (Oral)   Resp 19   Ht 6\' 3"  (1.905 m)   Wt 77.9 kg   SpO2 98%   BMI 21.47 kg/m  Vital signs in last 24 hours: Temp:  [97.5 F (36.4 C)-99.6 F (37.6 C)] 97.5 F (36.4 C) (06/07 1325) Pulse Rate:  [75-107] 79 (06/07 1325) Resp:  [13-28] 19 (06/07 1325) BP: (107-145)/(70-96) 107/70 (06/07 1325) SpO2:  [93 %-100 %] 98 % (06/07 1325) Weight:  [74.4 kg-79.4 kg] 77.9 kg (06/07 0208)   Constitutional: Increased atrophy in the legs and slightly malnourished Eyes: No scleral  injection HENT: No OP obstrucion Head: Normocephalic.  Cardiovascular: Normal rate and regular rhythm.  Respiratory: Effort normal, non-labored breathing GI: Soft.  No distension. There is no tenderness.  Skin: WDI  Neuro: Mental Status: Patient is awake, alert, oriented to person, place not month nor year.  Has difficulty naming objects.  Patient has difficulty following simple commands.  Speech sounds slightly dysarthric however wife states that this is normal.  Patient is unable to give a clear and coherent history of what brought him to the hospital. Cranial Nerves: II: Visual Fields are full.  III,IV, VI: EOMI without ptosis or diploplia. Pupils equal, round and reactive to light V: Facial sensation is symmetric to temperature VII: Left facial droop VIII: Slightly hard of hearing X: Palat elevates symmetrically XI: Shoulder shrug is symmetric. XII: tongue is midline without atrophy or fasciculations.  Motor: Tone is normal. Bulk is decreased especially in bilateral quadriceps and hamstrings . 5/5 strength was present in upper extremities, dorsiflexion/plantarflexion, knee flexion/extension, 3/5 with hip flexion. Sensory: Proprioception was intact, vibratory sensation was intact, temperature/light touch and pinprick were difficult to assess and was inconsistent.  The best I could gather was patient had decreased light touch and pinprick up to mid thigh. Deep Tendon Reflexes: 2+ and symmetric in the biceps and patellae.  Plantars: Toes are downgoing bilaterally.  Cerebellar: FNF showed no dysmetria, heel-to-shin was unable to obtain secondary to weakness with hip flexion.  Labs I have reviewed labs in epic and the results pertinent to this consultation are:   CBC    Component Value Date/Time   WBC 9.8 07/02/2019 0445   RBC 3.95 (L) 07/02/2019 0445   HGB 11.2 (L) 07/02/2019 0445   HGB 12.1 (L) 01/26/2018 1535   HCT 34.4 (L) 07/02/2019 0445   HCT 37.4 (L) 01/26/2018 1535    PLT 177 07/02/2019 0445   PLT 217 01/26/2018 1535   MCV 87.1 07/02/2019 0445   MCV 87 01/26/2018 1535   MCH 28.4 07/02/2019 0445   MCHC 32.6 07/02/2019 0445   RDW 15.1 07/02/2019 0445   RDW 14.4 01/26/2018 1535   LYMPHSABS 0.9 08/30/2018 0435   MONOABS 0.3 08/30/2018 0435   EOSABS 0.3 08/30/2018 0435   BASOSABS 0.0 08/30/2018 0435    CMP     Component Value Date/Time   NA 141 07/02/2019 0445   NA 146 (H) 01/26/2018 1535   K 4.3 07/02/2019 0445   CL 115 (H) 07/02/2019 0445   CO2 19 (L) 07/02/2019 0445   GLUCOSE 120 (H) 07/02/2019 0445   BUN 18 07/02/2019 0445   BUN 15 01/26/2018 1535   CREATININE 1.37 (H) 07/02/2019 0445   CALCIUM 8.8 (L) 07/02/2019 0445   PROT 7.1 08/30/2018 0435   PROT 7.6 01/26/2018 1535  ALBUMIN 3.1 (L) 08/30/2018 0435   ALBUMIN 4.1 01/26/2018 1535   AST 32 08/30/2018 0435   ALT 11 08/30/2018 0435   ALKPHOS 78 08/30/2018 0435   BILITOT 0.3 08/30/2018 0435   BILITOT <0.2 01/26/2018 1535   GFRNONAA 47 (L) 07/02/2019 0445   GFRAA 54 (L) 07/02/2019 0445    Lipid Panel     Component Value Date/Time   CHOL 105 07/02/2019 0445   CHOL 185 01/26/2018 1535   TRIG 38 07/02/2019 0445   HDL 31 (L) 07/02/2019 0445   HDL 39 (L) 01/26/2018 1535   CHOLHDL 3.4 07/02/2019 0445   VLDL 8 07/02/2019 0445   LDLCALC 66 07/02/2019 0445   LDLCALC 125 (H) 01/26/2018 1535     Imaging I have reviewed the images obtained:  CT head-no acute intracranial abnormalities identified  CTA head neck-negative CTA for large vessel occlusions or other acute vascular abnormalities.  Multifocal moderate to severe stenosis involving the intracranial circulation as above including moderate to severe right pericallosal stenosis, moderate left MCA bifurcation stenosis, with severe multifocal left P2 stenosis.  Additional mild to moderate atheromatous change about the aortic arch and carotid siphons with high-grade stenosis.  Continued wide patency of the major arterial  vasculature within the neck.  Chart review-patient has last seen Dr. Delice Lesch on 05/03/2019.  At that time he presented due to slowed speech movements, weakness.  In her note she states he has a neurodegenerative disorder with fluctuating cognition, bradykinesia, tremor.  She had previously discussed dementia with Lewy bodies.  He had significant improvement with low-dose Sinemet as it decreased his tremor and also his fluctuating speech.  At that time he also reported numbness from his feet up to his knees to the point where he could not feel the floor.  He reported he tends to drag his left leg.  Patient has had an EMG/nerve conduction study done in 08/2016 which was abnormal with generalized sensory motor polyneuropathy, could not rule out superimposed bilateral lower lumbosacral polyradiculopathy.  At that time gait disturbance was most likely multifactorial from lumbar disc disease and neuropathy.  Also had neuropathy labs-B1, B12, TSH, copper, SPEP,  folate and heavy metal screens which were all negative.  In 09/2018 patient had been seeing a rheumatologist and was diagnosed with Sjogren's syndrome.  Patient is to follow-up with Dr. Delice Lesch and 6 months following the 05/03/2019 appointment.   Etta Quill PA-C [facility] [phone]  M-F  (9:00 am- 5:00 PM)  07/02/2019, 2:04 PM     Assessment:  This is an 86 year old male presenting to the hospital with sudden increase in falls over the past weekend, progressive decline in cognition, and sensorimotor neuropathy.  There is clearly a multifactorial decline, however the acute decline in cognition does make me wonder if there was indeed a stroke.  He does have multi focal intracranial stenosis, and I think that stroke is a definite possibility.  Impression: -Falls -Lower extremity weakness -Deconditioning  Recommendations: -TSH, B12, folate, thiamine level, copper, SPEP -Physical therapy and Occupational Therapy - MRI brain  Roland Rack, MD [facility] [phone]  If 7pm- 7am, please page neurology on call as listed in [place].

## 2019-07-02 NOTE — Progress Notes (Signed)
  Echocardiogram 2D Echocardiogram with defintiy has been performed.  Derek Carr M 07/02/2019, 10:24 AM

## 2019-07-02 NOTE — Progress Notes (Signed)
Discussed case with Neurology attending, Dr. Rodney Booze. MRI not obtained due to pacemaker. Will need MRI obtained first. We will have to delay his heart catheterization until we know it is safe for full DAPT uninterrupted. If his MR can be done early in the morning and he can be cleared by neuro, we can proceed with left heart cath.   We will keep him NPO tomorrow in case we can pursue LHC tomorrow afternoon.  Family updated Inocencio Roy [phone]).  [place] T. Audie Box, [place]  [address] [place], [place] [address] [phone]  6:44 PM

## 2019-07-02 NOTE — Progress Notes (Signed)
[facility] for heparin  Indication: chest pain/ACS  Allergies  Allergen Reactions  . Meloxicam Other (See Comments)  . Oxycodone Other (See Comments)  . Dilaudid [Hydromorphone Hcl] Other (See Comments)    Confusion   . Methocarbamol Other (See Comments)    Drowsiness   . Percodan [Oxycodone-Aspirin] Other (See Comments)    Confusion   . Tramadol Other (See Comments)    Drowsiness   . Vicodin [Hydrocodone-Acetaminophen] Other (See Comments)    Confusion     Patient Measurements: Height: 6\' 3"  (190.5 cm) Weight: 77.9 kg (171 lb 11.8 oz) IBW/kg (Calculated) : 84.5   Vital Signs: Temp: 98.3 F (36.8 C) (06/07 0208) Temp Source: Oral (06/07 0208) BP: 126/81 (06/07 0507) Pulse Rate: 81 (06/07 0600)  Labs: Recent Labs    07/01/19 1905 07/01/19 2040 07/01/19 2240 07/02/19 0445 07/02/19 0640  HGB 12.3*  --   --  11.2*  --   HCT 38.7*  --   --  34.4*  --   PLT 188  --   --  177  --   HEPARINUNFRC  --   --   --   --  0.43  CREATININE 1.59*  --   --  1.37*  --   CKTOTAL  --  2,554*  --   --   --   TROPONINIHS >27,000* >27,000* >27,000*  --   --     Estimated Creatinine Clearance: 43.4 mL/min (A) (by C-G formula based on SCr of 1.37 mg/dL (H)).   Medical History: Past Medical History:  Diagnosis Date  . AAA (abdominal aortic aneurysm) ([place])   . Anemia   . Ankle fracture    left  . Aortic regurgitation   . Atherosclerosis   . Complication of anesthesia    CONFUSION - Pt's family very concerned about this  . DDD (degenerative disc disease)   . Degenerative arthritis   . Dermatitis, atopic ARMS AND LEGS  . Diverticulosis   . Erectile dysfunction   . Frequency of urination   . Glaucoma   . History of asbestos exposure   . History of radiation therapy 8/19-13-10/18/11   prostate, 14 GY  . History of shingles 2012-- BILATERAL EYES--  NO RESIDUAL  . Hyperlipidemia   . Hypertension   . Inguinal hernia   . Lumbar  scoliosis   . Nocturia   . OA (osteoarthritis) of hip RIGHT  . Prostate cancer ([place]) 05/11/2011   bx=Adenocarcinoma,gleason3+4=7, 4+4=8,PSA=10.30volume=45.7cc  . Renal cyst    bilateral  . Smokers' cough ([place])   . Stroke ([place])   . Thyroid cyst      Assessment: 86 yo male with elevated troponin to start heparin for r/o ACS. No anticoagulants noted PTA.   Initial heparin level therapeutic at 0.43, CBC stable..   Goal of Therapy:  Heparin level 0.3-0.7 units/ml Monitor platelets by anticoagulation protocol: Yes   Plan:   -Heparin 850 units/h -Daily heparin level and CBC   Arrie Senate, PharmD, BCPS Clinical Pharmacist [phone] Please check AMION for all [place] numbers 07/02/2019

## 2019-07-03 ENCOUNTER — Inpatient Hospital Stay (HOSPITAL_COMMUNITY): Payer: Medicare HMO

## 2019-07-03 ENCOUNTER — Encounter (HOSPITAL_COMMUNITY)
Admission: EM | Disposition: A | Discharge status: Hospice | Payer: Self-pay | Source: Home / Self Care | Attending: Cardiovascular Disease

## 2019-07-03 DIAGNOSIS — I2510 Atherosclerotic heart disease of native coronary artery without angina pectoris: Secondary | ICD-10-CM

## 2019-07-03 DIAGNOSIS — Z515 Encounter for palliative care: Secondary | ICD-10-CM

## 2019-07-03 DIAGNOSIS — Z7189 Other specified counseling: Secondary | ICD-10-CM

## 2019-07-03 HISTORY — PX: LEFT HEART CATH AND CORONARY ANGIOGRAPHY: CATH118249

## 2019-07-03 HISTORY — PX: CORONARY STENT INTERVENTION: CATH118234

## 2019-07-03 LAB — CBC
HCT: 35.3 % — ABNORMAL LOW (ref 39.0–52.0)
Hemoglobin: 11.6 g/dL — ABNORMAL LOW (ref 13.0–17.0)
MCH: 28.4 pg (ref 26.0–34.0)
MCHC: 32.9 g/dL (ref 30.0–36.0)
MCV: 86.5 fL (ref 80.0–100.0)
Platelets: 168 10*3/uL (ref 150–400)
RBC: 4.08 MIL/uL — ABNORMAL LOW (ref 4.22–5.81)
RDW: 15.2 % (ref 11.5–15.5)
WBC: 11.3 10*3/uL — ABNORMAL HIGH (ref 4.0–10.5)
nRBC: 0 % (ref 0.0–0.2)

## 2019-07-03 LAB — COMPREHENSIVE METABOLIC PANEL
ALT: 76 U/L — ABNORMAL HIGH (ref 0–44)
AST: 173 U/L — ABNORMAL HIGH (ref 15–41)
Albumin: 2.7 g/dL — ABNORMAL LOW (ref 3.5–5.0)
Alkaline Phosphatase: 74 U/L (ref 38–126)
Anion gap: 9 (ref 5–15)
BUN: 13 mg/dL (ref 8–23)
CO2: 22 mmol/L (ref 22–32)
Calcium: 8.9 mg/dL (ref 8.9–10.3)
Chloride: 109 mmol/L (ref 98–111)
Creatinine, Ser: 1.4 mg/dL — ABNORMAL HIGH (ref 0.61–1.24)
GFR calc Af Amer: 53 mL/min — ABNORMAL LOW (ref >60)
GFR calc non Af Amer: 45 mL/min — ABNORMAL LOW (ref >60)
Glucose, Bld: 133 mg/dL — ABNORMAL HIGH (ref 70–99)
Potassium: 3.5 mmol/L (ref 3.5–5.1)
Sodium: 140 mmol/L (ref 135–145)
Total Bilirubin: 0.7 mg/dL (ref 0.3–1.2)
Total Protein: 7.5 g/dL (ref 6.5–8.1)

## 2019-07-03 LAB — URINALYSIS, ROUTINE W REFLEX MICROSCOPIC
Bacteria, UA: NONE SEEN
Bilirubin Urine: NEGATIVE
Glucose, UA: NEGATIVE mg/dL
Hgb urine dipstick: NEGATIVE
Ketones, ur: NEGATIVE mg/dL
Leukocytes,Ua: NEGATIVE
Nitrite: NEGATIVE
Protein, ur: 100 mg/dL — AB
Specific Gravity, Urine: 1.023 (ref 1.005–1.030)
pH: 5 (ref 5.0–8.0)

## 2019-07-03 LAB — CERULOPLASMIN: Ceruloplasmin: 25.2 mg/dL (ref 16.0–31.0)

## 2019-07-03 LAB — POCT ACTIVATED CLOTTING TIME
Activated Clotting Time: 0 seconds
Activated Clotting Time: 241 seconds
Activated Clotting Time: 335 seconds

## 2019-07-03 LAB — HEPARIN LEVEL (UNFRACTIONATED): Heparin Unfractionated: 0.31 IU/mL (ref 0.30–0.70)

## 2019-07-03 SURGERY — LEFT HEART CATH AND CORONARY ANGIOGRAPHY
Anesthesia: LOCAL

## 2019-07-03 MED ORDER — SODIUM CHLORIDE 0.9 % IV SOLN
INTRAVENOUS | Status: AC | PRN
  Administered 2019-07-03: 100 mL/h via INTRAVENOUS

## 2019-07-03 MED ORDER — HEPARIN SODIUM (PORCINE) 1000 UNIT/ML IJ SOLN
INTRAMUSCULAR | Status: DC | PRN
  Administered 2019-07-03: 4000 [IU] via INTRAVENOUS
  Administered 2019-07-03: 5000 [IU] via INTRAVENOUS
  Administered 2019-07-03: 3000 [IU] via INTRAVENOUS

## 2019-07-03 MED ORDER — HEPARIN (PORCINE) 25000 UT/250ML-% IV SOLN
850.0000 [IU]/h | INTRAVENOUS | Status: DC
  Administered 2019-07-04: 850 [IU]/h via INTRAVENOUS
  Filled 2019-07-03: qty 250

## 2019-07-03 MED ORDER — HEPARIN (PORCINE) IN NACL 1000-0.9 UT/500ML-% IV SOLN
INTRAVENOUS | Status: DC | PRN
  Administered 2019-07-03 (×2): 500 mL

## 2019-07-03 MED ORDER — SODIUM CHLORIDE 0.9 % IV SOLN: INTRAVENOUS | Status: DC

## 2019-07-03 MED ORDER — LIDOCAINE HCL (PF) 1 % IJ SOLN
INTRAMUSCULAR | Status: DC | PRN
  Administered 2019-07-03: 2 mL

## 2019-07-03 MED ORDER — HEPARIN SODIUM (PORCINE) 1000 UNIT/ML IJ SOLN
INTRAMUSCULAR | Status: AC
  Filled 2019-07-03: qty 1

## 2019-07-03 MED ORDER — HALOPERIDOL LACTATE 5 MG/ML IJ SOLN
2.0000 mg | INTRAMUSCULAR | Status: AC | PRN
  Administered 2019-07-03 – 2019-07-04 (×2): 2 mg via INTRAVENOUS
  Filled 2019-07-03 (×2): qty 1

## 2019-07-03 MED ORDER — HEPARIN (PORCINE) IN NACL 1000-0.9 UT/500ML-% IV SOLN
INTRAVENOUS | Status: AC
  Filled 2019-07-03: qty 1000

## 2019-07-03 MED ORDER — IOHEXOL 350 MG/ML SOLN
INTRAVENOUS | Status: DC | PRN
  Administered 2019-07-03: 235 mL

## 2019-07-03 MED ORDER — TICAGRELOR 90 MG PO TABS
ORAL_TABLET | ORAL | Status: DC | PRN
  Administered 2019-07-03: 180 mg via ORAL

## 2019-07-03 MED ORDER — NITROGLYCERIN 1 MG/10 ML FOR IR/CATH LAB
INTRA_ARTERIAL | Status: AC
  Filled 2019-07-03: qty 10

## 2019-07-03 MED ORDER — LIDOCAINE HCL (PF) 1 % IJ SOLN
INTRAMUSCULAR | Status: AC
  Filled 2019-07-03: qty 30

## 2019-07-03 MED ORDER — TICAGRELOR 90 MG PO TABS
90.0000 mg | ORAL_TABLET | Freq: Two times a day (BID) | ORAL | Status: DC
  Administered 2019-07-04 (×2): 90 mg via ORAL
  Filled 2019-07-03 (×2): qty 1

## 2019-07-03 MED ORDER — TICAGRELOR 90 MG PO TABS
ORAL_TABLET | ORAL | Status: AC
  Filled 2019-07-03: qty 2

## 2019-07-03 MED ORDER — VERAPAMIL HCL 2.5 MG/ML IV SOLN
INTRAVENOUS | Status: AC
  Filled 2019-07-03: qty 2

## 2019-07-03 MED ORDER — NITROGLYCERIN 1 MG/10 ML FOR IR/CATH LAB
INTRA_ARTERIAL | Status: DC | PRN
  Administered 2019-07-03 (×5): 200 ug via INTRACORONARY

## 2019-07-03 MED ORDER — VERAPAMIL HCL 2.5 MG/ML IV SOLN
INTRAVENOUS | Status: DC | PRN
  Administered 2019-07-03: 10 mL via INTRA_ARTERIAL

## 2019-07-03 SURGICAL SUPPLY — 20 items
BALLN SAPPHIRE 2.0X12 (BALLOONS) ×2
BALLN SAPPHIRE ~~LOC~~ 2.25X15 (BALLOONS) ×2 IMPLANT
BALLOON SAPPHIRE 2.0X12 (BALLOONS) ×1 IMPLANT
CATH INFINITI 5 FR JL3.5 (CATHETERS) ×2 IMPLANT
CATH INFINITI JR4 5F (CATHETERS) ×2 IMPLANT
CATH OPTITORQUE TIG 4.0 5F (CATHETERS) ×2 IMPLANT
CATH VISTA GUIDE 6FR XBLAD3.5 (CATHETERS) ×2 IMPLANT
DEVICE RAD COMP TR BAND LRG (VASCULAR PRODUCTS) ×2 IMPLANT
ELECT DEFIB PAD ADLT CADENCE (PAD) ×2 IMPLANT
GLIDESHEATH SLEND SS 6F .021 (SHEATH) ×2 IMPLANT
GUIDEWIRE INQWIRE 1.5J.035X260 (WIRE) ×1 IMPLANT
HOVERMATT SINGLE USE (MISCELLANEOUS) ×2 IMPLANT
INQWIRE 1.5J .035X260CM (WIRE) ×2
KIT ENCORE 26 ADVANTAGE (KITS) ×2 IMPLANT
KIT HEART LEFT (KITS) ×2 IMPLANT
PACK CARDIAC CATHETERIZATION (CUSTOM PROCEDURE TRAY) ×2 IMPLANT
STENT RESOLUTE ONYX 2.25X22 (Permanent Stent) ×2 IMPLANT
TRANSDUCER W/STOPCOCK (MISCELLANEOUS) ×2 IMPLANT
TUBING CIL FLEX 10 FLL-RA (TUBING) ×2 IMPLANT
WIRE PT2 MS 185 (WIRE) ×2 IMPLANT

## 2019-07-03 NOTE — Progress Notes (Signed)
NEUROLOGY PROGRESS NOTE   Subjective: Patient was on his way out to get MRI.  Still confused.  Does not know month or year.  Had long discussion with daughter.  She informed me that he is having difficulty with his vision, stating it is constantly blurry, constantly states that he cannot feel where his feet are.  At times has freezing however the Sinemet has helped.  And also noted that he has been getting help with physical therapy 3 times a week.   Exam: Vitals:   07/03/19 0207 07/03/19 0727  BP: 127/82   Pulse: 94   Resp: 17   Temp: 98.6 F (37 C) 98 F (36.7 C)  SpO2: 100%      Neuro:  Mental Status: Able to follow simple commands.  Does not know year, date, and/or hospital. Cranial Nerves: II:  Visual fields grossly normal,  III,IV, VI: ptosis not present, extra-ocular motions intact bilaterally pupils equal, round, reactive to light and accommodation V,VII: smile symmetric, facial light touch sensation normal bilaterally VIII: Decreased hearing IX,X: Palate rises midline XI: bilateral shoulder shrug XII: midline tongue extension Motor: Continues to have decreased bulk especially bilateral quadriceps and hamstrings.  5/5 strength was present in upper extremities, dorsiflexion/plantarflexion, knee flexion/extension, again 3/5 with hip flexion bilaterally.    Medications:  Scheduled: . vitamin C  1,000 mg Oral Daily  . aspirin EC  81 mg Oral Daily  . atorvastatin  40 mg Oral Daily  . carbidopa-levodopa  1 tablet Oral Daily  . cholecalciferol  5,000 Units Oral Daily  . folic acid  1 mg Oral Daily  . metoprolol tartrate  12.5 mg Oral BID  . multivitamin with minerals  1 tablet Oral Daily  . omega-3 acid ethyl esters  1,000 mg Oral Daily  . pregabalin  25 mg Oral QHS  . sodium chloride flush  3 mL Intravenous Q12H  . umeclidinium-vilanterol  2 puff Inhalation QHS  . valACYclovir  1,000 mg Oral Daily   Continuous: . sodium chloride    . sodium chloride 1 mL/kg/hr  (07/03/19 0453)  . heparin 850 Units/hr (07/01/19 2228)   SWN:IOEVOJ chloride, acetaminophen, albuterol, haloperidol lactate, sodium chloride flush  Pertinent Labs/Diagnostics: Thiamine pending B12 957 Ceruloplasmin 25.2 SPEP pending Folate 24.1 TSH 0.317  Patient has gone for MRI.    Etta Quill PA-C [facility] [phone]  Assessment: This is a 86-year-old male presented to the hospital with sudden increase in falls over the past weekend, progressive decline in cognition, and sensorymotor neuropathy.  I still believe there is clearly a multifactorial aspect of his decline and with the new information of decreased vision along with inability to feel the floor I feel like this plays a large role in his falls.  Impression: -Falls -Lower extremity weakness -Deconditioning  Recommendations: -Awaiting copper, SPEP, thiamine levels -Continue physical therapy occupational therapy -Awaiting MRI findings.    07/03/2019, 9:54 AM

## 2019-07-03 NOTE — Progress Notes (Signed)
Informed of MRI for today.   Device system confirmed to be MRI conditional, with implant date > 6 weeks ago and no evidence of abandoned or epicardial leads in review of most recent CXR Interrogation from today reviewed, pt is currently AS-VS at 76 bpm Change device settings for MRI to ODO for procedure.  Tachy-therapies to off if applicable.  Program device back to pre-MRI settings after completion of exam.  Annamaria Helling  07/03/2019 9:35 AM

## 2019-07-03 NOTE — Progress Notes (Signed)
OT Cancellation Note  Patient Details Name: Derek Carr MRN: [id number] DOB: Feb 28, 1931   Cancelled Treatment:    Reason Eval/Treat Not Completed: Patient at procedure or test/ unavailable (cath lab). Will follow up for OT eval as able.  Lou Cal, OT Acute Rehabilitation Services Pager [phone] Office [phone]   [name] 07/03/2019, 3:16 PM

## 2019-07-03 NOTE — Progress Notes (Signed)
Cardiology Progress Note  Patient ID: Derek Carr MRN: [id number] DOB: October 18, 1931 Date of Encounter: 07/03/2019  Primary Cardiologist: Cristopher Peru, MD  Subjective   Chief Complaint: Confusion   HPI: Denies CP, SOB, or LE edema. Confusion overnight per daughter. Delirium has been an issue.   ROS:  All other ROS reviewed and negative. Pertinent positives noted in the HPI.     Inpatient Medications  Scheduled Meds: . vitamin C  1,000 mg Oral Daily  . aspirin EC  81 mg Oral Daily  . atorvastatin  40 mg Oral Daily  . carbidopa-levodopa  1 tablet Oral Daily  . cholecalciferol  5,000 Units Oral Daily  . folic acid  1 mg Oral Daily  . metoprolol tartrate  12.5 mg Oral BID  . multivitamin with minerals  1 tablet Oral Daily  . omega-3 acid ethyl esters  1,000 mg Oral Daily  . pregabalin  25 mg Oral QHS  . sodium chloride flush  3 mL Intravenous Q12H  . umeclidinium-vilanterol  2 puff Inhalation QHS  . valACYclovir  1,000 mg Oral Daily   Continuous Infusions: . sodium chloride    . sodium chloride 1 mL/kg/hr (07/03/19 0453)  . heparin 850 Units/hr (07/01/19 2228)   PRN Meds: sodium chloride, acetaminophen, albuterol, haloperidol lactate, sodium chloride flush   Vital Signs   Vitals:   07/02/19 2148 07/03/19 0207 07/03/19 0300 07/03/19 0727  BP:  127/82    Pulse: 86 94    Resp:  17    Temp:  98.6 F (37 C)  98 F (36.7 C)  TempSrc:  Oral  Oral  SpO2:  100%    Weight:   79.8 kg   Height:        Intake/Output Summary (Last 24 hours) at 07/03/2019 0818 Last data filed at 07/03/2019 0410 Gross per 24 hour  Intake 502.5 ml  Output 300 ml  Net 202.5 ml   Last 3 Weights 07/03/2019 07/02/2019 07/01/2019  Weight (lbs) 176 lb 171 lb 11.8 oz 164 lb  Weight (kg) 79.833 kg 77.9 kg 74.39 kg      Telemetry  Overnight telemetry shows NSR 70s, which I personally reviewed.   ECG  The most recent ECG shows 79 bpm, RBBB/LAFB, nonspecific STT changes, which I personally reviewed.    Physical Exam   Vitals:   07/02/19 2148 07/03/19 0207 07/03/19 0300 07/03/19 0727  BP:  127/82    Pulse: 86 94    Resp:  17    Temp:  98.6 F (37 C)  98 F (36.7 C)  TempSrc:  Oral  Oral  SpO2:  100%    Weight:   79.8 kg   Height:         Intake/Output Summary (Last 24 hours) at 07/03/2019 0818 Last data filed at 07/03/2019 0410 Gross per 24 hour  Intake 502.5 ml  Output 300 ml  Net 202.5 ml    Last 3 Weights 07/03/2019 07/02/2019 07/01/2019  Weight (lbs) 176 lb 171 lb 11.8 oz 164 lb  Weight (kg) 79.833 kg 77.9 kg 74.39 kg    Body mass index is 22 kg/m.  General: Well nourished, well developed, in no acute distress Head: Atraumatic, normal size  Eyes: PEERLA, EOMI  Neck: Supple, no JVD Endocrine: No thryomegaly Cardiac: Normal S1, S2; RRR; no murmurs, rubs, or gallops Lungs: Clear to auscultation bilaterally, no wheezing, rhonchi or rales  Abd: Soft, nontender, no hepatomegaly  Ext: No edema, pulses 2+ Musculoskeletal: No deformities, BUE and  BLE strength normal and equal Skin: Warm and dry, no rashes   Neuro: Alert and oriented to person, place only  Labs  High Sensitivity Troponin:   Recent Labs  Lab 07/01/19 1905 07/01/19 2040 07/01/19 2240  TROPONINIHS >27,000* >27,000* >27,000*     Cardiac EnzymesNo results for input(s): TROPONINI in the last 168 hours. No results for input(s): TROPIPOC in the last 168 hours.  Chemistry Recent Labs  Lab 07/01/19 1905 07/02/19 0445 07/03/19 0438  NA 138 141 140  K 4.0 4.3 3.5  CL 108 115* 109  CO2 20* 19* 22  GLUCOSE 162* 120* 133*  BUN 18 18 13   CREATININE 1.59* 1.37* 1.40*  CALCIUM 9.3 8.8* 8.9  PROT  --   --  7.5  ALBUMIN  --   --  2.7*  AST  --   --  173*  ALT  --   --  76*  ALKPHOS  --   --  74  BILITOT  --   --  0.7  GFRNONAA 39* 47* 45*  GFRAA 45* 54* 53*  ANIONGAP 10 7 9     Hematology Recent Labs  Lab 07/01/19 1905 07/02/19 0445 07/03/19 0438  WBC 7.9 9.8 11.3*  RBC 4.37 3.95* 4.08*  HGB 12.3*  11.2* 11.6*  HCT 38.7* 34.4* 35.3*  MCV 88.6 87.1 86.5  MCH 28.1 28.4 28.4  MCHC 31.8 32.6 32.9  RDW 15.3 15.1 15.2  PLT 188 177 168   BNPNo results for input(s): BNP, PROBNP in the last 168 hours.  DDimer No results for input(s): DDIMER in the last 168 hours.   Radiology  CT Angio Head W or Wo Contrast  Result Date: 07/01/2019 CLINICAL DATA:  Initial evaluation for acute trauma, fall, weakness, confusion. EXAM: CT ANGIOGRAPHY HEAD AND NECK TECHNIQUE: Multidetector CT imaging of the head and neck was performed using the standard protocol during bolus administration of intravenous contrast. Multiplanar CT image reconstructions and MIPs were obtained to evaluate the vascular anatomy. Carotid stenosis measurements (when applicable) are obtained utilizing NASCET criteria, using the distal internal carotid diameter as the denominator. CONTRAST:  1mL OMNIPAQUE IOHEXOL 350 MG/ML SOLN COMPARISON:  Prior CT from 03/02/2017. FINDINGS: CT HEAD FINDINGS Brain: Generalized age-related cerebral atrophy with chronic small vessel ischemic disease, stable. No acute intracranial hemorrhage. No acute large vessel territory infarct. No mass lesion, midline shift or mass effect. No hydrocephalus or extra-axial fluid collection. Vascular: No hyperdense vessel. Calcified atherosclerosis present at skull base. Skull: Scalp soft tissues demonstrate no acute finding. Calvarium intact. Sinuses: Mild scattered mucosal thickening noted within the ethmoidal air cells. Small amount of pneumatized secretions present within the left sphenoid sinus. Small right mastoid effusion noted, of doubtful significance. Orbits: Globes and orbital soft tissues demonstrate no acute finding. Review of the MIP images confirms the above findings CTA NECK FINDINGS Aortic arch: Visualized aortic arch of normal caliber with normal 3 vessel morphology. Moderate atherosclerotic change about the arch and origin of the great vessels without flow-limiting  stenosis. Right carotid system: Right common and internal carotid arteries patent without stenosis, dissection or occlusion. Mild eccentric calcified plaque at the right bifurcation without stenosis. Left carotid system: Left common and internal carotid arteries widely patent without stenosis, dissection, or occlusion. No significant atheromatous narrowing about the left bifurcation. Vertebral arteries: Both vertebral arteries arise from the subclavian arteries. No proximal subclavian stenosis. Vertebral arteries patent within the neck without stenosis, dissection, or occlusion. Skeleton: No visible acute osseous abnormality. No discrete or worrisome osseous  lesions. Moderate to advanced degenerative spondylosis noted at C3-4 through C7-T1. Other neck: No other acute soft tissue abnormality within the neck. No mass lesion or adenopathy. Upper chest: Left-sided pacemaker/AICD noted. Dependent atelectatic changes noted within the visualized right lung. Underlying centrilobular emphysema. Review of the MIP images confirms the above findings CTA HEAD FINDINGS Anterior circulation: Petrous segments patent bilaterally. Scattered calcified atheromatous plaque throughout the cavernous/supraclinoid ICAs with associated mild to moderate multifocal narrowing (no greater than 50%). A1 segments patent bilaterally. Right A1 slightly hypoplastic. Normal anterior communicating artery complex. Anterior cerebral arteries widely patent at the proximal and mid aspects. Short-segment moderate to severe distal right ACA/pericallosal stenosis noted (series 14, image 13). M1 segments patent bilaterally. Normal MCA bifurcations. Focal moderate stenosis at the left MCA bifurcation at the origins of both superior and inferior divisions (series 14, image 19). Right MCA bifurcation within normal limits. Distal MCA branches well perfused and symmetric. Posterior circulation: Scattered atheromatous plaque within the bilateral V4 segments without  high-grade stenosis, right slightly worse than left. Both picas patent. Basilar patent to its distal aspect without stenosis. Superior cerebral arteries patent bilaterally. Both PCAs primarily supplied via the basilar. Severe multifocal stenoses seen involving the left P2 segment (series 14, image 20). PCAs otherwise well perfused to their distal aspects. Venous sinuses: Grossly patent allowing for timing the contrast bolus. Anatomic variants: None significant.  No intracranial aneurysm. Review of the MIP images confirms the above findings IMPRESSION: CT HEAD IMPRESSION: 1. No acute intracranial abnormality identified. 2. Age-related cerebral atrophy with mild to moderate chronic microvascular ischemic disease. CTA HEAD AND NECK IMPRESSION: 1. Negative CTA for large vessel occlusion or other acute vascular abnormality. 2. Multifocal moderate to severe stenoses involving the intracranial circulation as above including moderate to severe right pericallosal stenosis, moderate left MCA bifurcation stenosis, with severe multifocal left P2 stenoses. 3. Additional mild-to-moderate atheromatous change about the aortic arch and carotid siphons without high-grade stenosis. Continued wide patency of the major arterial vasculature within the neck. 4.  Emphysema (ICD10-J43.9). Electronically Signed   By: Jeannine Boga M.D.   On: 07/01/2019 23:10   DG Chest 2 View  Result Date: 07/01/2019 CLINICAL DATA:  Weakness with slip and fall. EXAM: CHEST - 2 VIEW COMPARISON:  September 17, 2017 FINDINGS: There is a dual lead AICD. A trace amount of atelectasis is noted within the bilateral lung bases. There is no evidence of a pleural effusion or pneumothorax. The heart size and mediastinal contours are within normal limits. Multilevel degenerative changes seen throughout the thoracic spine. IMPRESSION: Trace amount of bibasilar atelectasis without acute or active cardiopulmonary disease. Electronically Signed   By: Virgina Norfolk M.D.   On: 07/01/2019 19:28   CT Angio Neck W and/or Wo Contrast  Result Date: 07/01/2019 CLINICAL DATA:  Initial evaluation for acute trauma, fall, weakness, confusion. EXAM: CT ANGIOGRAPHY HEAD AND NECK TECHNIQUE: Multidetector CT imaging of the head and neck was performed using the standard protocol during bolus administration of intravenous contrast. Multiplanar CT image reconstructions and MIPs were obtained to evaluate the vascular anatomy. Carotid stenosis measurements (when applicable) are obtained utilizing NASCET criteria, using the distal internal carotid diameter as the denominator. CONTRAST:  4mL OMNIPAQUE IOHEXOL 350 MG/ML SOLN COMPARISON:  Prior CT from 03/02/2017. FINDINGS: CT HEAD FINDINGS Brain: Generalized age-related cerebral atrophy with chronic small vessel ischemic disease, stable. No acute intracranial hemorrhage. No acute large vessel territory infarct. No mass lesion, midline shift or mass effect. No hydrocephalus or extra-axial fluid  collection. Vascular: No hyperdense vessel. Calcified atherosclerosis present at skull base. Skull: Scalp soft tissues demonstrate no acute finding. Calvarium intact. Sinuses: Mild scattered mucosal thickening noted within the ethmoidal air cells. Small amount of pneumatized secretions present within the left sphenoid sinus. Small right mastoid effusion noted, of doubtful significance. Orbits: Globes and orbital soft tissues demonstrate no acute finding. Review of the MIP images confirms the above findings CTA NECK FINDINGS Aortic arch: Visualized aortic arch of normal caliber with normal 3 vessel morphology. Moderate atherosclerotic change about the arch and origin of the great vessels without flow-limiting stenosis. Right carotid system: Right common and internal carotid arteries patent without stenosis, dissection or occlusion. Mild eccentric calcified plaque at the right bifurcation without stenosis. Left carotid system: Left common and  internal carotid arteries widely patent without stenosis, dissection, or occlusion. No significant atheromatous narrowing about the left bifurcation. Vertebral arteries: Both vertebral arteries arise from the subclavian arteries. No proximal subclavian stenosis. Vertebral arteries patent within the neck without stenosis, dissection, or occlusion. Skeleton: No visible acute osseous abnormality. No discrete or worrisome osseous lesions. Moderate to advanced degenerative spondylosis noted at C3-4 through C7-T1. Other neck: No other acute soft tissue abnormality within the neck. No mass lesion or adenopathy. Upper chest: Left-sided pacemaker/AICD noted. Dependent atelectatic changes noted within the visualized right lung. Underlying centrilobular emphysema. Review of the MIP images confirms the above findings CTA HEAD FINDINGS Anterior circulation: Petrous segments patent bilaterally. Scattered calcified atheromatous plaque throughout the cavernous/supraclinoid ICAs with associated mild to moderate multifocal narrowing (no greater than 50%). A1 segments patent bilaterally. Right A1 slightly hypoplastic. Normal anterior communicating artery complex. Anterior cerebral arteries widely patent at the proximal and mid aspects. Short-segment moderate to severe distal right ACA/pericallosal stenosis noted (series 14, image 13). M1 segments patent bilaterally. Normal MCA bifurcations. Focal moderate stenosis at the left MCA bifurcation at the origins of both superior and inferior divisions (series 14, image 19). Right MCA bifurcation within normal limits. Distal MCA branches well perfused and symmetric. Posterior circulation: Scattered atheromatous plaque within the bilateral V4 segments without high-grade stenosis, right slightly worse than left. Both picas patent. Basilar patent to its distal aspect without stenosis. Superior cerebral arteries patent bilaterally. Both PCAs primarily supplied via the basilar. Severe multifocal  stenoses seen involving the left P2 segment (series 14, image 20). PCAs otherwise well perfused to their distal aspects. Venous sinuses: Grossly patent allowing for timing the contrast bolus. Anatomic variants: None significant.  No intracranial aneurysm. Review of the MIP images confirms the above findings IMPRESSION: CT HEAD IMPRESSION: 1. No acute intracranial abnormality identified. 2. Age-related cerebral atrophy with mild to moderate chronic microvascular ischemic disease. CTA HEAD AND NECK IMPRESSION: 1. Negative CTA for large vessel occlusion or other acute vascular abnormality. 2. Multifocal moderate to severe stenoses involving the intracranial circulation as above including moderate to severe right pericallosal stenosis, moderate left MCA bifurcation stenosis, with severe multifocal left P2 stenoses. 3. Additional mild-to-moderate atheromatous change about the aortic arch and carotid siphons without high-grade stenosis. Continued wide patency of the major arterial vasculature within the neck. 4.  Emphysema (ICD10-J43.9). Electronically Signed   By: Jeannine Boga M.D.   On: 07/01/2019 23:10   ECHOCARDIOGRAM COMPLETE  Result Date: 07/02/2019    ECHOCARDIOGRAM REPORT   Patient Name:   Derek [facility] Date of Exam: 07/02/2019 Medical Rec #:  [id number]      Height:       75.0 in Accession #:    [id number]  Weight:       171.7 lb Date of Birth:  Mar 30, 1931     BSA:          2.057 m Patient Age:    82 years       BP:           137/80 mmHg Patient Gender: M              HR:           71 bpm. Exam Location:  Inpatient Procedure: 2D Echo and Intracardiac Opacification Agent Indications:    Acute myocardial infarction 410  History:        Patient has prior history of Echocardiogram examinations, most                 recent 09/15/2017. Stroke; Risk Factors:Hypertension and                 Dyslipidemia. AAA. Past history of cancer.  Sonographer:    Darlina Sicilian RDCS Referring Phys: [phone] ADAM S BARNETT  IMPRESSIONS  1. Left ventricular ejection fraction, by estimation, is 40 to 45%. The left ventricle has mildly decreased function. The left ventricle demonstrates regional wall motion abnormalities (see scoring diagram/findings for description). There is mild left ventricular hypertrophy. Left ventricular diastolic parameters are consistent with Grade I diastolic dysfunction (impaired relaxation). There is severe akinesis of the left ventricular, entire inferior wall, inferolateral wall and lateral wall, suggestive of LCx territory infarct.  2. Right ventricular systolic function is mildly reduced. The right ventricular size is normal. There is normal pulmonary artery systolic pressure.  3. The mitral valve is grossly normal. Trivial mitral valve regurgitation.  4. The aortic valve is tricuspid. Aortic valve regurgitation is not visualized. Comparison(s): Changes from prior study are noted. 09/15/2017: LVEF 55-60%. FINDINGS  Left Ventricle: Left ventricular ejection fraction, by estimation, is 40 to 45%. The left ventricle has mildly decreased function. The left ventricle demonstrates regional wall motion abnormalities. Severe akinesis of the left ventricular, entire inferior wall, inferolateral wall and lateral wall. Definity contrast agent was given IV to delineate the left ventricular endocardial borders. The left ventricular internal cavity size was normal in size. There is mild left ventricular hypertrophy. Left  ventricular diastolic parameters are consistent with Grade I diastolic dysfunction (impaired relaxation). Indeterminate filling pressures. Right Ventricle: The right ventricular size is normal. No increase in right ventricular wall thickness. Right ventricular systolic function is mildly reduced. There is normal pulmonary artery systolic pressure. The tricuspid regurgitant velocity is 1.67 m/s, and with an assumed right atrial pressure of 8 mmHg, the estimated right ventricular systolic pressure is 48.8  mmHg. Left Atrium: Left atrial size was normal in size. Right Atrium: Right atrial size was normal in size. Pericardium: There is no evidence of pericardial effusion. Mitral Valve: The mitral valve is grossly normal. Trivial mitral valve regurgitation. Tricuspid Valve: The tricuspid valve is grossly normal. Tricuspid valve regurgitation is trivial. Aortic Valve: The aortic valve is tricuspid. Aortic valve regurgitation is not visualized. Pulmonic Valve: The pulmonic valve was not well visualized. Pulmonic valve regurgitation is not visualized. Aorta: The aortic root and ascending aorta are structurally normal, with no evidence of dilitation. IAS/Shunts: No atrial level shunt detected by color flow Doppler. Additional Comments: A pacer wire is visualized.  LEFT VENTRICLE PLAX 2D LVOT diam:     1.90 cm      Diastology LV SV:         52  LV e' lateral:   6.74 cm/s LV SV Index:   25           LV E/e' lateral: 8.3 LVOT Area:     2.84 cm     LV e' medial:    4.35 cm/s                             LV E/e' medial:  12.9  LV Volumes (MOD) LV vol d, MOD A2C: 172.0 ml LV vol d, MOD A4C: 154.0 ml LV vol s, MOD A2C: 91.9 ml LV vol s, MOD A4C: 85.5 ml LV SV MOD A2C:     80.1 ml LV SV MOD A4C:     154.0 ml LV SV MOD BP:      74.8 ml RIGHT VENTRICLE RV S prime:     9.25 cm/s TAPSE (M-mode): 2.0 cm LEFT ATRIUM             Index LA Vol (A2C):   64.5 ml 31.36 ml/m LA Vol (A4C):   50.0 ml 24.31 ml/m LA Biplane Vol: 61.1 ml 29.71 ml/m  AORTIC VALVE LVOT Vmax:   105.00 cm/s LVOT Vmean:  68.200 cm/s LVOT VTI:    0.182 m  AORTA Ao Root diam: 3.10 cm MITRAL VALVE               TRICUSPID VALVE MV Area (PHT): 4.68 cm    TR Peak grad:   11.2 mmHg MV Decel Time: 162 msec    TR Vmax:        167.00 cm/s MV E velocity: 56.20 cm/s MV A velocity: 74.40 cm/s  SHUNTS MV E/A ratio:  0.76        Systemic VTI:  0.18 m                            Systemic Diam: 1.90 cm Lyman Bishop MD Electronically signed by Lyman Bishop MD Signature  Date/Time: 07/02/2019/11:44:17 AM    Final     Cardiac Studies  TTE 07/02/2019 1. Left ventricular ejection fraction, by estimation, is 40 to 45%. The  left ventricle has mildly decreased function. The left ventricle  demonstrates regional wall motion abnormalities (see scoring  diagram/findings for description). There is mild left  ventricular hypertrophy. Left ventricular diastolic parameters are  consistent with Grade I diastolic dysfunction (impaired relaxation). There  is severe akinesis of the left ventricular, entire inferior wall,  inferolateral wall and lateral wall,  suggestive of LCx territory infarct.  2. Right ventricular systolic function is mildly reduced. The right  ventricular size is normal. There is normal pulmonary artery systolic  pressure.  3. The mitral valve is grossly normal. Trivial mitral valve  regurgitation.  4. The aortic valve is tricuspid. Aortic valve regurgitation is not  visualized.   Patient Profile  Derek Carr is a 86 y.o. male with AAA, SSS s/p ppm placement, neurodegenerative disorder (dementia, gait disturbance, tremor, etc), orthostatic hypotension, CKD 3, TIA who was admitted 07/01/2019 with weakness and fatigue. Initial concerns for stroke but CT head negative. Labs and echo consistent with posterior MI.   Assessment & Plan   NSTEMI: EKG with RBBB/LAFB and non-specific STT changes. Troponin >27k. Echo with EF 40-45% and posterior wall hypokinesis. I suspect initial symptoms of weakness and fatigue 2 days prior to admission was actually an acute coronary syndrome to explain his symptoms. Continue ASA/heparin drip  for now. I added metoprolol yesterday. He is on lipitor 40 mg QHS. CT head negative for stroke. Neuro has evaluated and has concerns for stroke. MRI pending to determine if he can pursue LHC safely. We will plan for MRI today and then if cleared by neurology, we will pursue left heart cath with possible PCI. I suspect he will have a LCX  lesion. He is in no pain and tele is unremarkable. He has no electrical or hemodynamic instability. He is not in clinical heart failure. Therefore, there is no need for emergent left heart cath. We will attempt LHC today after MRI, but only if this is safe. I discussed this with the family, including the option to just treat him medically. They wish to proceed with LHC if safe from neuro standpoint.   For questions or updates, please contact [place] Please consult [email or web address] for contact info under   Time Spent with Patient: I have spent a total of 25 minutes with patient reviewing hospital notes, telemetry, EKGs, labs and examining the patient as well as establishing an assessment and plan that was discussed with the patient.  > 50% of time was spent in direct patient care.    Signed, Addison Naegeli. Audie Box, [place]  07/03/2019 8:18 AM

## 2019-07-03 NOTE — Progress Notes (Signed)
[facility] for heparin  Indication: chest pain/ACS  Allergies  Allergen Reactions  . Meloxicam Other (See Comments)  . Oxycodone Other (See Comments)  . Dilaudid [Hydromorphone Hcl] Other (See Comments)    Confusion   . Methocarbamol Other (See Comments)    Drowsiness   . Percodan [Oxycodone-Aspirin] Other (See Comments)    Confusion   . Tramadol Other (See Comments)    Drowsiness   . Vicodin [Hydrocodone-Acetaminophen] Other (See Comments)    Confusion     Patient Measurements: Height: 6\' 3"  (190.5 cm) Weight: 79.8 kg (176 lb) IBW/kg (Calculated) : 84.5   Vital Signs: Temp: 98 F (36.7 C) (06/08 1916) Temp Source: Oral (06/08 1916) BP: 106/76 (06/08 1916) Pulse Rate: 86 (06/08 1916)  Labs: Recent Labs    07/01/19 1905 07/01/19 1905 07/01/19 2040 07/01/19 2240 07/02/19 0445 07/02/19 0640 07/03/19 0438  HGB 12.3*   < >  --   --  11.2*  --  11.6*  HCT 38.7*  --   --   --  34.4*  --  35.3*  PLT 188  --   --   --  177  --  168  HEPARINUNFRC  --   --   --   --   --  0.43 0.31  CREATININE 1.59*  --   --   --  1.37*  --  1.40*  CKTOTAL  --   --  2,554*  --   --   --   --   TROPONINIHS >27,000*  --  >27,000* >27,000*  --   --   --    < > = values in this interval not displayed.    Estimated Creatinine Clearance: 43.5 mL/min (A) (by C-G formula based on SCr of 1.4 mg/dL (H)).   Medical History: Past Medical History:  Diagnosis Date  . AAA (abdominal aortic aneurysm) ([place])   . Anemia   . Ankle fracture    left  . Aortic regurgitation   . Atherosclerosis   . Complication of anesthesia    CONFUSION - Pt's family very concerned about this  . DDD (degenerative disc disease)   . Degenerative arthritis   . Dermatitis, atopic ARMS AND LEGS  . Diverticulosis   . Erectile dysfunction   . Frequency of urination   . Glaucoma   . History of asbestos exposure   . History of radiation therapy 8/19-13-10/18/11   prostate, 44  GY  . History of shingles 2012-- BILATERAL EYES--  NO RESIDUAL  . Hyperlipidemia   . Hypertension   . Inguinal hernia   . Lumbar scoliosis   . Nocturia   . OA (osteoarthritis) of hip RIGHT  . Prostate cancer ([place]) 05/11/2011   bx=Adenocarcinoma,gleason3+4=7, 4+4=8,PSA=10.30volume=45.7cc  . Renal cyst    bilateral  . Smokers' cough ([place])   . Stroke ([place])   . Thyroid cyst      Assessment: 86 yo male with elevated troponin to start heparin for r/o ACS. No anticoagulants noted PTA. Patient now s/p cath 6/8 showing multivessel obstructive disease. Cardiology planning staged PCI to circumflex and RCA 6/9 if SCr remains stable. Pharmacy consulted to resume heparin 8 hrs post-sheath removal. Sheath removed at 1640 per cath procedure log.  Heparin drip was previously therapeutic on 850 units/hr, CBC stable. No active bleed issues reported.  Goal of Therapy:  Heparin level 0.3-0.7 units/ml Monitor platelets by anticoagulation protocol: Yes   Plan:   Resume heparin IV at previous rate 850 units/h  on 6/9 at 0045 (8 hrs post-sheath removal) Check 8h heparin level Monitor daily heparin level and CBC, s/sx bleeding F/u Cardiology plans for staged PCI tomorrow if SCr remains stable   Arturo Morton, PharmD, BCPS Please check AMION for all [place] contact numbers Clinical Pharmacist 07/03/2019 8:34 PM

## 2019-07-03 NOTE — Consult Note (Addendum)
Palliative Medicine Inpatient Consult Note  Reason for consult:  Goals of Care  HPI:  Per intake H&P --> Derek Carr is a 86 y.o. male with AAA, SSS s/p ppm placement, neurodegenerative disorder (dementia, gait disturbance, tremor, etc), orthostatic hypotension, CKD 3, TIA who was admitted 07/01/2019 with weakness and fatigue. Initial concerns for stroke but CT head negative. Labs and echo consistent with posterior MI.   Palliative care was asked to get involved to help address goals of care.   Clinical Assessment/Goals of Care: I have reviewed medical records including EPIC notes, labs and imaging, received report from bedside RN, assessed the patient.    I met with Derek Carr to further discuss diagnosis prognosis, GOC, EOL wishes, disposition and options.   I introduced Palliative Medicine as specialized medical care for people living with serious illness. It focuses on providing relief from the symptoms and stress of a serious illness. The goal is to improve quality of life for both the patient and the family.  I asked Derek Carr to tell me about Derek Carr as a person. Derek Carr shares that he is from [place] originally. He is married to Derek Carr, though she suffer from dementia. They share three daughters and one son together; Derek Carr, and Derek Carr. 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 He use to work in the [employer]. 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 He is a strong Engineer, manufacturing and practices the [employer].  Prior to hospitalization Ramello was living in a single family home with his wife, Derek Carr and daughter, Derek Carr. Derek Carr is her parents primary caregiver. She helps with medication management and food preparation. Otherwise it sounds like Shelby was able to bath, dress, and drive locally. The area is lives is surrounded by family members (neices and nephews). He was able to  mobilize roughly 500 feet with a front wheel walker prior to coming in.  Overall it sounds like Freds health has been fair at home. He has suffered a gradual decline over the past five years much of which is blamed on his worsening BLE neuropathy. Derek Carr shares that this and his parkinson's like diagnosis have left her to wonder if he has post-polio syndrome. She shares that she plans to see a physiatrist (Dr. Pricilla Holm) in [place] and a Geriatrician (Dr. Patrice Paradise) at [facility] to better understand his conditions.   We discussed the complications of hospitalization inclusive of delirium in the setting of his recent cardiac event. Plan will be for possible cardiac catheterization today for great clarity of cause.   A detailed discussion was had today regarding advanced directives, Derek Carr shares that she is the [place]. I requested that she bring in these papers for verification and to get them scanned into our system for future use.    I introduce the MOST form. Concepts specific to code status, artifical feeding and hydration, continued IV antibiotics and rehospitalization was had.  Derek Carr shares that she is grateful we have opened this conversation. At the present time she would want to discuss this with her other family members. I shared that we can certainly set up a date and time to meet to have a more formal conversations surrounding code status and future goals which she was open to. As it stand patient will remain FULL Code/ Full scope of care until additional discussion take place. Plan to focus on the trajectory of dementia during  that time.   Derek Carr is in agreement with OP Palliative care referral and asked if her mother could also be seen to have these important conversations. I told her that I would inform the OP palliative network to get an order for this from her PCP [name]).  Discussed the importance of continued conversation with family and their  medical providers regarding overall plan of care  and treatment options, ensuring decisions are within the context of the patients values and GOCs.  Decision Maker: Derek Carr [phone]  SUMMARY OF RECOMMENDATIONS   Full Code for the time being  Ongoing [place] discussions, trying to arrange a family meeting  Requested HCPOA documents be brought in by patients daughter, Derek Carr so that we may scan them into [facility]  Chaplain consult  Code Status/Advance Care Planning: FULL CODE   Symptom Management:  Muscular Weakness:                 - Physical Therapy Evaluation                 - Occupational Therapy Evaluation   Xerostomia:                 - Good oral care QShift                 - Encourage liquid intake  Delirium:                 - Delirium precautions                 - Get up during the day                 - Encourage a familiar face to remain present throughout the day                 - Keep blinds open and lights on during daylight hours                 - Minimize the use of opioids/benzodiazepines   Spiritual:                 - Chaplain consult   Palliative Prophylaxis:   Oral care, Turn Q2H, Mobility, Delirium  Additional Recommendations (Limitations, Scope, Preferences):  Full scope of treatment   Psycho-social/Spiritual:   Desire for further Chaplaincy support: Yes  Additional Recommendations: Education on outpatient palliative care and advanced care planning   Prognosis: Unable to determine presently  Discharge Planning: Will need evaluation by PT/OT to make a determination.   PPS: 10% (NPO for cardiac catheterization) BL is likely a far better precent    This conversation/these recommendations were discussed with patient primary care team, Dr. Cruzita Lederer  Time In: 0700 Time Out: 0815 Total Time: 22 Greater than 50%  of this time was spent counseling and coordinating care related to the above assessment and plan.  [place] Team Team Cell Phone:  [phone] Please utilize secure chat with additional questions, if there is no response within 30 minutes please call the above phone number  Palliative Medicine Team providers are available by phone from 7am to 7pm daily and can be reached through the team cell phone.  Should this patient require assistance outside of these hours, please call the patient's attending physician.

## 2019-07-03 NOTE — Progress Notes (Signed)
SLP Cancellation Note  Patient Details Name: SANTO ZAHRADNIK MRN: [id number] DOB: 04/09/1931   Cancelled treatment:        Pt NPO today for possible heart cath. Will follow and perform BSE when able.    Houston Siren 07/03/2019, 8:06 AM  Orbie Pyo Colvin Caroli.Ed Risk analyst [phone] Office [phone]

## 2019-07-03 NOTE — Progress Notes (Signed)
[facility] for heparin  Indication: chest pain/ACS  Allergies  Allergen Reactions  . Meloxicam Other (See Comments)  . Oxycodone Other (See Comments)  . Dilaudid [Hydromorphone Hcl] Other (See Comments)    Confusion   . Methocarbamol Other (See Comments)    Drowsiness   . Percodan [Oxycodone-Aspirin] Other (See Comments)    Confusion   . Tramadol Other (See Comments)    Drowsiness   . Vicodin [Hydrocodone-Acetaminophen] Other (See Comments)    Confusion     Patient Measurements: Height: 6\' 3"  (190.5 cm) Weight: 79.8 kg (176 lb) IBW/kg (Calculated) : 84.5   Vital Signs: Temp: 98 F (36.7 C) (06/08 0727) Temp Source: Oral (06/08 0727) BP: 127/82 (06/08 0207) Pulse Rate: 94 (06/08 0207)  Labs: Recent Labs    07/01/19 1905 07/01/19 1905 07/01/19 2040 07/01/19 2240 07/02/19 0445 07/02/19 0640 07/03/19 0438  HGB 12.3*   < >  --   --  11.2*  --  11.6*  HCT 38.7*  --   --   --  34.4*  --  35.3*  PLT 188  --   --   --  177  --  168  HEPARINUNFRC  --   --   --   --   --  0.43 0.31  CREATININE 1.59*  --   --   --  1.37*  --  1.40*  CKTOTAL  --   --  2,554*  --   --   --   --   TROPONINIHS >27,000*  --  >27,000* >27,000*  --   --   --    < > = values in this interval not displayed.    Estimated Creatinine Clearance: 43.5 mL/min (A) (by C-G formula based on SCr of 1.4 mg/dL (H)).   Medical History: Past Medical History:  Diagnosis Date  . AAA (abdominal aortic aneurysm) ([place])   . Anemia   . Ankle fracture    left  . Aortic regurgitation   . Atherosclerosis   . Complication of anesthesia    CONFUSION - Pt's family very concerned about this  . DDD (degenerative disc disease)   . Degenerative arthritis   . Dermatitis, atopic ARMS AND LEGS  . Diverticulosis   . Erectile dysfunction   . Frequency of urination   . Glaucoma   . History of asbestos exposure   . History of radiation therapy 8/19-13-10/18/11   prostate, 61  GY  . History of shingles 2012-- BILATERAL EYES--  NO RESIDUAL  . Hyperlipidemia   . Hypertension   . Inguinal hernia   . Lumbar scoliosis   . Nocturia   . OA (osteoarthritis) of hip RIGHT  . Prostate cancer ([place]) 05/11/2011   bx=Adenocarcinoma,gleason3+4=7, 4+4=8,PSA=10.30volume=45.7cc  . Renal cyst    bilateral  . Smokers' cough ([place])   . Stroke ([place])   . Thyroid cyst      Assessment: 86 yo male with elevated troponin to start heparin for r/o ACS. No anticoagulants noted PTA.   Heparin level remains therapeutic, CBC stable.  Goal of Therapy:  Heparin level 0.3-0.7 units/ml Monitor platelets by anticoagulation protocol: Yes   Plan:   -Heparin 850 units/h -Daily heparin level and CBC   Arrie Senate, PharmD, BCPS Clinical Pharmacist [phone] Please check AMION for all [place] numbers 07/03/2019

## 2019-07-03 NOTE — Progress Notes (Signed)
   07/03/19 1411  Clinical Encounter Type  Visited With Patient and family together  Visit Type Initial;Spiritual support  Referral From Palliative care team  Consult/Referral To Chaplain  Spiritual Encounters  Spiritual Needs Prayer  This chaplain responded to PMT consult for Pt. and family spiritual care.  The chaplain checked in with RN-Stephanie upon arrival to the unit. The chaplain was greeted by the Pt. daughter-Cheryl bedside and the Pt. contagious smile. Malachy Mood shared the Pt. wife is at home surrounding the Pt. with prayer. The chaplain listened to the family review and everyone's role in the Pt. healthcare.  The chaplain understands the Pt. is waiting for a procedures to assist in the Pt. goals of care. The chaplain accepts the family's request for prayers of blessing and peace.  The Pt. and family is comforted by continued presence of spiritual care providers.

## 2019-07-03 NOTE — Care Management (Signed)
07-03-19 1820 Benefits check submitted for [facility]. Case Manager will follow for cost. Case Manager to discuss with family regarding outpatient palliative services on 07-04-19. Bethena Roys, RN,BSN Case Manager

## 2019-07-03 NOTE — Progress Notes (Signed)
PROGRESS NOTE  Derek Carr YIR:[id number] DOB: 09-21-1931 DOA: 07/01/2019 PCP: [name], MD   LOS: 2 days   Brief Narrative / Interim history: Male with history of hypertension, hyperlipidemia, prostate cancer, orthostatic hypotension, OSA on CPAP, TIAs, neurodegenerative disorder of fluctuating cognition/bradykinesia and tremor, follows with neurology as an outpatient, AAA, sinus node dysfunction status post PPM, CKD 3A, ongoing tobacco use, [place] Sjogren's who came into the hospital with acute on chronic mental status changes and profound weakness for the past 2 days prior to admission.  He also has been having blurred bilateral vision and a fall on the morning of admission with worsening slurred speech and right lower extremity weakness.  He was found to have an NSTEMI with significantly elevated troponin in the ED greater than 20 7K, and his Medtronic pacemaker showed a nonsustained VT on 6/2.   Subjective / 24h Interval events: He remains confused, daughter is at bedside.  He has no complaints for me.  No chest pain, no abdominal pain, no shortness of breath, no nausea or vomiting.  Assessment & Plan: Principal Problem Non-STEMI-cardiology consulted, appreciate input.  There is suspicion for large non-STEMI in the left circumflex territory.  He has been placed on heparin infusion, continue.  There is concern for CVA and prior to committing patient to potential dual antiplatelet therapy and MRI is pending.  He underwent a 2D echo which showed LVEF 40-45% with severe akinesis of the left ventricular, entire inferior wall, inferolateral wall and large lateral wall suggestive of LCx territory infarct  Active Problems Acute kidney injury on chronic kidney disease stage IIIa-Baseline creatinine around 1.2-1.3, presented with a creatinine of 1.59 which has improved now back to baseline.  Continue to monitor  Acute metabolic encephalopathy complicated neurocognitive decline-MRI pending,  if stroke ruled out could also be progressive neurocognitive decline.  Neurology checking labs (TSH, B12, thiamine, copper, SPEP).  Continue Sinemet  Hyperlipidemia-continue statin  OSA on CPAP  Essential hypertension-blood pressure controlled  Peripheral neuropathy-plays a role in his gait issues and falls.  Neurology following  COPD/tobacco use -50-pack-year smoking history, used to smoke 2 packs a day, now down to 4 cigarettes.  Continue inhalers  ?  Sjogren's, on Plaquenil  Scheduled Meds: . vitamin C  1,000 mg Oral Daily  . aspirin EC  81 mg Oral Daily  . atorvastatin  40 mg Oral Daily  . carbidopa-levodopa  1 tablet Oral Daily  . cholecalciferol  5,000 Units Oral Daily  . folic acid  1 mg Oral Daily  . metoprolol tartrate  12.5 mg Oral BID  . multivitamin with minerals  1 tablet Oral Daily  . omega-3 acid ethyl esters  1,000 mg Oral Daily  . pregabalin  25 mg Oral QHS  . sodium chloride flush  3 mL Intravenous Q12H  . umeclidinium-vilanterol  2 puff Inhalation QHS  . valACYclovir  1,000 mg Oral Daily   Continuous Infusions: . sodium chloride    . sodium chloride 1 mL/kg/hr (07/03/19 0453)  . heparin 850 Units/hr (07/01/19 2228)   PRN Meds:.sodium chloride, acetaminophen, albuterol, haloperidol lactate, sodium chloride flush  DVT prophylaxis: heparin Code Status: Full code Family Communication: daughter at bedside   Status is: Inpatient  Remains inpatient appropriate because:Undergoing cardiac cath today   Dispo: The patient is from: Home              Anticipated d/c is to: Home              Anticipated d/c  date is: 2 days              Patient currently is not medically stable to d/c.  Consultants:  Cardiology  Neurology  Procedures:  2D echo  Microbiology  none  Antimicrobials: none    Objective: Vitals:   07/03/19 0300 07/03/19 0727 07/03/19 1125 07/03/19 1134  BP:      Pulse:   80 78  Resp:      Temp:  98 F (36.7 C) 98.1 F (36.7 C)     TempSrc:  Oral Oral   SpO2:   100%   Weight: 79.8 kg     Height:        Intake/Output Summary (Last 24 hours) at 07/03/2019 1304 Last data filed at 07/03/2019 0410 Gross per 24 hour  Intake 340 ml  Output 300 ml  Net 40 ml   Filed Weights   07/01/19 2033 07/02/19 0208 07/03/19 0300  Weight: 74.4 kg 77.9 kg 79.8 kg    Examination:  Constitutional: NAD Eyes: no scleral icterus ENMT: Mucous membranes are moist.  Neck: normal, supple Respiratory: clear to auscultation bilaterally, no wheezing, no crackles. Cardiovascular: Regular rate and rhythm, no murmurs / rubs / gallops. No LE edema.  Abdomen: non distended, no tenderness. Bowel sounds positive.  Musculoskeletal: no clubbing / cyanosis.  Skin: no rashes Neurologic: no focal deficits   Data Reviewed: I have independently reviewed following labs and imaging studies   CBC: Recent Labs  Lab 07/01/19 1905 07/02/19 0445 07/03/19 0438  WBC 7.9 9.8 11.3*  HGB 12.3* 11.2* 11.6*  HCT 38.7* 34.4* 35.3*  MCV 88.6 87.1 86.5  PLT 188 177 161   Basic Metabolic Panel: Recent Labs  Lab 07/01/19 1905 07/01/19 2320 07/02/19 0445 07/03/19 0438  NA 138  --  141 140  K 4.0  --  4.3 3.5  CL 108  --  115* 109  CO2 20*  --  19* 22  GLUCOSE 162*  --  120* 133*  BUN 18  --  18 13  CREATININE 1.59*  --  1.37* 1.40*  CALCIUM 9.3  --  8.8* 8.9  MG  --  1.8  --   --    Liver Function Tests: Recent Labs  Lab 07/03/19 0438  AST 173*  ALT 76*  ALKPHOS 74  BILITOT 0.7  PROT 7.5  ALBUMIN 2.7*   Coagulation Profile: No results for input(s): INR, PROTIME in the last 168 hours. HbA1C: No results for input(s): HGBA1C in the last 72 hours. CBG: No results for input(s): GLUCAP in the last 168 hours.  Recent Results (from the past 240 hour(s))  SARS Coronavirus 2 by RT PCR (hospital order, performed in [facility] hospital lab) Nasopharyngeal Nasopharyngeal Swab     Status: None   Collection Time: 07/01/19 11:55 PM   Specimen:  Nasopharyngeal Swab  Result Value Ref Range Status   SARS Coronavirus 2 NEGATIVE NEGATIVE Final    Comment: (NOTE) SARS-CoV-2 target nucleic acids are NOT DETECTED. The SARS-CoV-2 RNA is generally detectable in upper and lower respiratory specimens during the acute phase of infection. The lowest concentration of SARS-CoV-2 viral copies this assay can detect is 250 copies / mL. A negative result does not preclude SARS-CoV-2 infection and should not be used as the sole basis for treatment or other patient management decisions.  A negative result may occur with improper specimen collection / handling, submission of specimen other than nasopharyngeal swab, presence of viral mutation(s) within the areas targeted  by this assay, and inadequate number of viral copies (<250 copies / mL). A negative result must be combined with clinical observations, patient history, and epidemiological information. Fact Sheet for Patients:   StrictlyIdeas.no Fact Sheet for Healthcare Providers: BankingDealers.co.za This test is not yet approved or cleared  by the [place] FDA and has been authorized for detection and/or diagnosis of SARS-CoV-2 by FDA under an Emergency Use Authorization (EUA).  This EUA will remain in effect (meaning this test can be used) for the duration of the COVID-19 declaration under Section 564(b)(1) of the Act, 21 U.S.C. section 360bbb-3(b)(1), unless the authorization is terminated or revoked sooner. Performed at [facility], [place] [address], [place], [place] [address]      Radiology Studies: MR BRAIN WO CONTRAST  Result Date: 07/03/2019 CLINICAL DATA:  Encephalopathy. EXAM: MRI HEAD WITHOUT CONTRAST TECHNIQUE: Multiplanar, multiecho pulse sequences of the brain and surrounding structures were obtained without intravenous contrast. COMPARISON:  Head CT July 01, 2019 FINDINGS: Brain: No acute infarction, hemorrhage,  hydrocephalus, extra-axial collection or mass lesion. Focus of encephalomalacia in the splenium of the corpus callosum on the left with increased signal on the diffusion-weighted images at its margins, without corresponding low signal on ADC, consistent with T2 shine through. This infarct was seen on prior CT performed in July 01, 2019, but new since MRI performed in February 02, 2018. Scattered foci of T2 hyperintensity are seen within the white matter of cerebral hemispheres and within pons, nonspecific, most likely related to chronic small vessel ischemia. Prominence of the cerebral sulci reflecting mild parenchymal volume loss. Vascular: Normal flow voids. Skull and upper cervical spine: Normal marrow signal. Sinuses/Orbits: Bilateral lens surgery. Mucosal thickening of the bilateral ethmoid cells and right maxillary sinus. Other: Right mastoid effusion. IMPRESSION: 1. No evidence of acute intracranial abnormality. 2. Focus of encephalomalacia in the splenium of the corpus callosum on the left with increased signal on the diffusion-weighted images at its margins, without corresponding low signal on ADC, consistent with T2 shine through. This infarct was seen on prior CT performed in July 01, 2019, but new since MRI performed in February 02, 2018. 3. Mild chronic small vessel ischemic changes. 4. Mild parenchymal volume loss. 5. Paranasal sinus disease as described. 6. Right mastoid effusion. Electronically Signed   By: Pedro Earls M.D.   On: 07/03/2019 11:32   Marzetta Board, MD, PhD [facility]  Between 7 am - 7 pm I am available, please contact me via Amion or Securechat  Between 7 pm - 7 am I am not available, please contact night coverage MD/APP via Amion

## 2019-07-03 NOTE — Progress Notes (Signed)
Went into room to talk with daughter.  Asked her about his CPAP and his mask he wears.  Patient stated that patient is agitated tonight and even though her other sister wants their Dad to wear his CPAP tonight, she feels he is agitated and it would only make things worse for tonight.  Patient already has on mittens, and daughter is at bedside.  Will continue to monitor.

## 2019-07-03 NOTE — Progress Notes (Signed)
PT Cancellation Note  Patient Details Name: Derek Carr MRN: [id number] DOB: 03-Jun-1931   Cancelled Treatment:    Reason Eval/Treat Not Completed: Patient at procedure or test/unavailable Pt currently in cath lab. Will follow up as schedule allows.   Lou Miner, DPT  Acute Rehabilitation Services  Pager: [phone] Office: [phone]    Derek Carr 07/03/2019, 3:18 PM

## 2019-07-04 ENCOUNTER — Inpatient Hospital Stay (HOSPITAL_COMMUNITY): Payer: Medicare HMO

## 2019-07-04 ENCOUNTER — Inpatient Hospital Stay: Payer: Self-pay

## 2019-07-04 ENCOUNTER — Inpatient Hospital Stay (HOSPITAL_COMMUNITY): Payer: Medicare HMO | Admitting: Certified Registered Nurse Anesthetist

## 2019-07-04 DIAGNOSIS — I339 Acute and subacute endocarditis, unspecified: Secondary | ICD-10-CM

## 2019-07-04 DIAGNOSIS — R0602 Shortness of breath: Secondary | ICD-10-CM

## 2019-07-04 DIAGNOSIS — I340 Nonrheumatic mitral (valve) insufficiency: Secondary | ICD-10-CM

## 2019-07-04 DIAGNOSIS — R0600 Dyspnea, unspecified: Secondary | ICD-10-CM

## 2019-07-04 DIAGNOSIS — I959 Hypotension, unspecified: Secondary | ICD-10-CM

## 2019-07-04 DIAGNOSIS — R011 Cardiac murmur, unspecified: Secondary | ICD-10-CM

## 2019-07-04 LAB — LACTIC ACID, PLASMA
Lactic Acid, Venous: 1.3 mmol/L (ref 0.5–1.9)
Lactic Acid, Venous: 2.1 mmol/L (ref 0.5–1.9)
Lactic Acid, Venous: 1.4 mmol/L (ref 0.5–1.9)
Lactic Acid, Venous: 1.7 mmol/L (ref 0.5–1.9)

## 2019-07-04 LAB — BLOOD GAS, ARTERIAL
Acid-base deficit: 4.9 mmol/L — ABNORMAL HIGH (ref 0.0–2.0)
Bicarbonate: 17.7 mmol/L — ABNORMAL LOW (ref 20.0–28.0)
Drawn by: 36529
FIO2: 21
O2 Saturation: 96.1 %
Patient temperature: 37
pCO2 arterial: 22.6 mmHg — ABNORMAL LOW (ref 32.0–48.0)
pH, Arterial: 7.506 — ABNORMAL HIGH (ref 7.350–7.450)
pO2, Arterial: 74.4 mmHg — ABNORMAL LOW (ref 83.0–108.0)

## 2019-07-04 LAB — CBC
HCT: 30.3 % — ABNORMAL LOW (ref 39.0–52.0)
Hemoglobin: 10.2 g/dL — ABNORMAL LOW (ref 13.0–17.0)
MCH: 28.8 pg (ref 26.0–34.0)
MCHC: 33.7 g/dL (ref 30.0–36.0)
MCV: 85.6 fL (ref 80.0–100.0)
Platelets: 157 10*3/uL (ref 150–400)
RBC: 3.54 MIL/uL — ABNORMAL LOW (ref 4.22–5.81)
RDW: 14.8 % (ref 11.5–15.5)
WBC: 9.9 10*3/uL (ref 4.0–10.5)
nRBC: 0 % (ref 0.0–0.2)

## 2019-07-04 LAB — COMPREHENSIVE METABOLIC PANEL
ALT: 56 U/L — ABNORMAL HIGH (ref 0–44)
AST: 100 U/L — ABNORMAL HIGH (ref 15–41)
Albumin: 2.2 g/dL — ABNORMAL LOW (ref 3.5–5.0)
Alkaline Phosphatase: 61 U/L (ref 38–126)
Anion gap: 6 (ref 5–15)
BUN: 17 mg/dL (ref 8–23)
CO2: 21 mmol/L — ABNORMAL LOW (ref 22–32)
Calcium: 8.4 mg/dL — ABNORMAL LOW (ref 8.9–10.3)
Chloride: 115 mmol/L — ABNORMAL HIGH (ref 98–111)
Creatinine, Ser: 1.68 mg/dL — ABNORMAL HIGH (ref 0.61–1.24)
GFR calc Af Amer: 42 mL/min — ABNORMAL LOW (ref >60)
GFR calc non Af Amer: 36 mL/min — ABNORMAL LOW (ref >60)
Glucose, Bld: 127 mg/dL — ABNORMAL HIGH (ref 70–99)
Potassium: 3.4 mmol/L — ABNORMAL LOW (ref 3.5–5.1)
Sodium: 142 mmol/L (ref 135–145)
Total Bilirubin: 1 mg/dL (ref 0.3–1.2)
Total Protein: 6.6 g/dL (ref 6.5–8.1)

## 2019-07-04 LAB — ECHOCARDIOGRAM LIMITED
Height: 75 in
Weight: 3008 oz

## 2019-07-04 LAB — PROTEIN ELECTROPHORESIS, SERUM
A/G Ratio: 0.7 (ref 0.7–1.7)
Albumin ELP: 3 g/dL (ref 2.9–4.4)
Alpha-1-Globulin: 0.3 g/dL (ref 0.0–0.4)
Alpha-2-Globulin: 0.9 g/dL (ref 0.4–1.0)
Beta Globulin: 1.4 g/dL — ABNORMAL HIGH (ref 0.7–1.3)
Gamma Globulin: 1.5 g/dL (ref 0.4–1.8)
Globulin, Total: 4.1 g/dL — ABNORMAL HIGH (ref 2.2–3.9)
Total Protein ELP: 7.1 g/dL (ref 6.0–8.5)

## 2019-07-04 LAB — COOXEMETRY PANEL
Carboxyhemoglobin: 0.8 % (ref 0.5–1.5)
Methemoglobin: 0.8 % (ref 0.0–1.5)
O2 Saturation: 38.4 %
Total hemoglobin: 14.3 g/dL (ref 12.0–16.0)

## 2019-07-04 MED ORDER — SODIUM CHLORIDE 0.9% FLUSH
10.0000 mL | Freq: Two times a day (BID) | INTRAVENOUS | Status: DC
  Administered 2019-07-05 – 2019-07-06 (×2): 10 mL

## 2019-07-04 MED ORDER — SODIUM CHLORIDE 0.9 % IV SOLN: INTRAVENOUS | Status: DC | PRN

## 2019-07-04 MED ORDER — SODIUM CHLORIDE 0.9% FLUSH: 3.0000 mL | Freq: Two times a day (BID) | INTRAVENOUS | Status: DC

## 2019-07-04 MED ORDER — MILRINONE LACTATE IN DEXTROSE 20-5 MG/100ML-% IV SOLN
0.2500 ug/kg/min | INTRAVENOUS | Status: DC
  Administered 2019-07-04 – 2019-07-05 (×2): 0.25 ug/kg/min via INTRAVENOUS
  Filled 2019-07-04: qty 100

## 2019-07-04 MED ORDER — CHLORHEXIDINE GLUCONATE CLOTH 2 % EX PADS
6.0000 | MEDICATED_PAD | Freq: Every day | CUTANEOUS | Status: DC
  Administered 2019-07-04: 6 via TOPICAL

## 2019-07-04 MED ORDER — SODIUM CHLORIDE 0.9 % IV SOLN: 250.0000 mL | INTRAVENOUS | Status: DC | PRN

## 2019-07-04 MED ORDER — SODIUM CHLORIDE 0.9% FLUSH: 10.0000 mL | INTRAVENOUS | Status: DC | PRN

## 2019-07-04 MED ORDER — HALOPERIDOL LACTATE 5 MG/ML IJ SOLN
2.0000 mg | INTRAMUSCULAR | Status: AC | PRN
  Administered 2019-07-04 – 2019-07-05 (×2): 2 mg via INTRAVENOUS
  Filled 2019-07-04 (×2): qty 1

## 2019-07-04 MED ORDER — ASPIRIN EC 81 MG PO TBEC: 81.0000 mg | DELAYED_RELEASE_TABLET | Freq: Every day | ORAL | Status: DC

## 2019-07-04 MED ORDER — ASPIRIN 81 MG PO CHEW
81.0000 mg | CHEWABLE_TABLET | ORAL | Status: AC
  Administered 2019-07-04: 81 mg via ORAL
  Filled 2019-07-04: qty 1

## 2019-07-04 MED ORDER — FUROSEMIDE 10 MG/ML IJ SOLN
60.0000 mg | Freq: Once | INTRAMUSCULAR | Status: AC
  Administered 2019-07-04: 60 mg via INTRAVENOUS
  Filled 2019-07-04: qty 6

## 2019-07-04 MED ORDER — SODIUM CHLORIDE 0.9 % IV SOLN: INTRAVENOUS | Status: DC

## 2019-07-04 MED ORDER — POTASSIUM CHLORIDE CRYS ER 20 MEQ PO TBCR
40.0000 meq | EXTENDED_RELEASE_TABLET | Freq: Once | ORAL | Status: AC
  Administered 2019-07-04: 40 meq via ORAL
  Filled 2019-07-04: qty 2

## 2019-07-04 MED ORDER — SODIUM CHLORIDE 0.9% FLUSH: 3.0000 mL | INTRAVENOUS | Status: DC | PRN

## 2019-07-04 NOTE — Progress Notes (Signed)
   07/04/19 0817  Assess: MEWS Score  Temp 98.3 F (36.8 C)  BP 95/72  Pulse Rate 83  ECG Heart Rate 84  Resp (!) 21  Level of Consciousness Alert  SpO2 99 %  O2 Device Room Air  Assess: MEWS Score  MEWS Temp 0  MEWS Systolic 1  MEWS Pulse 0  MEWS RR 1  MEWS LOC 0  MEWS Score 2  MEWS Score Color Yellow  Assess: if the MEWS score is Yellow or Red  Were vital signs taken at a resting state? Yes  Focused Assessment Documented focused assessment  Early Detection of Sepsis Score *See Row Information* Low  MEWS guidelines implemented *See Row Information* Yes  Treat  MEWS Interventions Administered scheduled meds/treatments (IV lasix)  Take Vital Signs  Increase Vital Sign Frequency  Yellow: Q 2hr X 2 then Q 4hr X 2, if remains yellow, continue Q 4hrs  Escalate  MEWS: Escalate Yellow: discuss with charge nurse/RN and consider discussing with provider and RRT  Notify: Charge Nurse/RN  Name of Charge Nurse/RN Notified Carroll Kinds  Date Charge Nurse/RN Notified 07/04/19  Time Charge Nurse/RN Notified [id number]  Notify: Provider  Provider Name/Title Dr. Audie Box  Date Provider Notified 07/04/19  Time Provider Notified [phone]  Notification Type Face-to-face  Notification Reason Other (Comment) (morning rounds; MD at bedside at this time)  Response See new orders (IV lasix)  Date of Provider Response 07/04/19  Time of Provider Response [phone]  Document  Patient Outcome Other (Comment) (remains on department; stable; IV lasix administered)  Progress note created (see row info) Yes

## 2019-07-04 NOTE — Procedures (Signed)
Arterial Catheter Insertion Procedure Note Derek Carr [id number] 1931/07/25  Procedure: Insertion of Arterial Catheter  Indications: Blood pressure monitoring and Frequent blood sampling  Procedure Details Consent: Risks of procedure as well as the alternatives and risks of each were explained to the (patient/caregiver).  Consent for procedure obtained. Time Out: Verified patient identification, verified procedure, site/side was marked, verified correct patient position, special equipment/implants available, medications/allergies/relevent history reviewed, required imaging and test results available.  Performed  Maximum sterile technique was used including cap, gloves, gown, hand hygiene and mask. Skin prep: Chlorhexidine; local anesthetic administered 20 gauge catheter was inserted into left radial artery using the Seldinger technique. ULTRASOUND GUIDANCE USED: NO Evaluation Blood flow good; BP tracing good. Complications: No apparent complications.   Revonda Standard 07/04/2019

## 2019-07-04 NOTE — Progress Notes (Signed)
Patient sleeping. Spoke with daughter in regards to CPAP. States patient does wear CPAP at home however patient has been agitated and is finally settled down and resting. Her sister will be staying tonight and if they feel patient needs CPAP they will let RN know to call for RT. Patient currently on RA, spo2 100%.

## 2019-07-04 NOTE — Progress Notes (Signed)
SLP Cancellation Note  Patient Details Name: Derek Carr MRN: [id number] DOB: [birth year]   Cancelled treatment:       Reason Eval/Treat Not Completed: Medical issues which prohibited therapy. Case discussed with Colletta Maryland, RN and she indicated that the pt is currently with cardiology and MDs are planning an urgent family meeting due to the pt's new and various medical challenges. It was recommended that the swallow evaluation be deferred. SLP will follow up on subsequent date unless contacted sooner by team.   Tobie Poet I. Hardin Negus, [place], [place] Office number [phone] Pager [place] 07/04/2019, 9:26 AM

## 2019-07-04 NOTE — Progress Notes (Signed)
Peripherally Inserted Central Catheter Placement  The IV Nurse has discussed with the patient and/or persons authorized to consent for the patient, the purpose of this procedure and the potential benefits and risks involved with this procedure.  The benefits include less needle sticks, lab draws from the catheter, and the patient may be discharged home with the catheter. Risks include, but not limited to, infection, bleeding, blood clot (thrombus formation), and puncture of an artery; nerve damage and irregular heartbeat and possibility to perform a PICC exchange if needed/ordered by physician.  Alternatives to this procedure were also discussed.  Bard Power PICC patient education guide, fact sheet on infection prevention and patient information card has been provided to patient /or left at bedside.  Unable to contact daughter.  Dr Audie Box signed as medication necessary.   PICC Placement Documentation  PICC Triple Lumen 07/04/19 PICC Right Brachial 43 cm 0 cm (Active)  Indication for Insertion or Continuance of Line Vasoactive infusions 07/04/19 1655  Exposed Catheter (cm) 0 cm 07/04/19 1655  Site Assessment Clean;Dry;Intact 07/04/19 1655  Lumen #1 Status Flushed;Saline locked;Blood return noted 07/04/19 1655  Lumen #2 Status Flushed;Saline locked;Blood return noted 07/04/19 1655  Lumen #3 Status Flushed;Saline locked;Blood return noted 07/04/19 1655  Dressing Type Transparent 07/04/19 1655  Dressing Status Clean;Dry;Intact;Antimicrobial disc in place 07/04/19 [place] Not Applicable 53/20/23 3435  Dressing Intervention New dressing 07/04/19 1655  Dressing Change Due 07/11/19 07/04/19 1655       Gordan Payment 07/04/2019, 4:56 PM

## 2019-07-04 NOTE — Progress Notes (Signed)
Clinical update:  Derek Carr was noted to have a new murmur this morning.  Blood pressures have been tenuous with systolics in the 18-984.  A stat echocardiogram shows severe mitral vegetation with an eccentric posteriorly directed jet.  He does not have evidence of papillary muscle rupture on echo.  Possibly there was chordal rupture.  He does have tethering of the posterior mitral valve leaflet with pseudoprolapse of the anterior mitral valve.  Clearly there was a change.  I had extensive discussion with the family.  Given this new finding of severe mitral vegetation, worsening shortness of breath he will need to be evaluated for surgical intervention.  We will plan to move him to the ICU and place a central line and arterial line.  We will then have him evaluated by surgery.  The family would like to move forward with a full evaluation before any more decisions are made.  He will remain full code.  We will transfer him to the [facility] ICU under our service.  [place] T. Audie Box, [place]  [address], [place], [place] [address] [phone]  11:23 AM

## 2019-07-04 NOTE — Progress Notes (Addendum)
PROGRESS NOTE  Derek Carr GMW:[id number] DOB: 1931/08/16 DOA: 07/01/2019 PCP: [name], MD   LOS: 3 days   Brief Narrative / Interim history: Male with history of hypertension, hyperlipidemia, prostate cancer, orthostatic hypotension, OSA on CPAP, TIAs, neurodegenerative disorder of fluctuating cognition/bradykinesia and tremor, follows with neurology as an outpatient, AAA, sinus node dysfunction status post PPM, CKD 3A, ongoing tobacco use, [place] Sjogren's who came into the hospital with acute on chronic mental status changes and profound weakness for the past 2 days prior to admission.  He also has been having blurred bilateral vision and a fall on the morning of admission with worsening slurred speech and right lower extremity weakness.  He was found to have an NSTEMI with significantly elevated troponin in the ED greater than 20 7K, and his [employer] pacemaker showed a nonsustained VT on 6/2.   Subjective / 24h Interval events: He remains confused, daughter is at bedside.  He has no complaints for me.  No chest pain, no abdominal pain, no shortness of breath, no nausea or vomiting.  Assessment & Plan: Principal Problem Non-STEMI with concerns for acute MR-cardiology consulted, appreciate input.  Patient underwent a cardiac catheterization on 6/8 which showed multivessel coronary obstructive disease with a totally occluded diagonal 1 vessel at its ostium from the LAD status post successful PCI.  There is consideration for patient to have another cath for staged PCI to the circumflex and RCA, however he became more short of breath this morning, felt to have acute MR and will be transferred to the ICU.  Discussed with Dr. Davina Poke, he will kindly take over while in the ICU.  Hospitalist team will not plan to follow but please call us back with any questions  Active Problems Acute kidney injury on chronic kidney disease stage IIIa-Baseline creatinine around 1.2-1.3, his creatinine  increasing today likely in the setting of hypotension and acute MR.  To be moved to the ICU Acute metabolic encephalopathy complicated neurocognitive decline-MRI without acute findings, possibly progressive neurocognitive decline.  Neurology checking labs (TSH, B12, thiamine, copper, SPEP).  Continue Sinemet Hyperlipidemia-continue statin OSA on CPAP Essential hypertension-hypotensive today Peripheral neuropathy-plays a role in his gait issues and falls.  Neurology following COPD/tobacco use -50-pack-year smoking history, used to smoke 2 packs a day, now down to 4 cigarettes.  Continue inhalers ?  Sjogren's, on Plaquenil  Scheduled Meds: . vitamin C  1,000 mg Oral Daily  . [START ON 07/05/2019] aspirin EC  81 mg Oral Daily  . atorvastatin  40 mg Oral Daily  . carbidopa-levodopa  1 tablet Oral Daily  . cholecalciferol  5,000 Units Oral Daily  . folic acid  1 mg Oral Daily  . metoprolol tartrate  12.5 mg Oral BID  . multivitamin with minerals  1 tablet Oral Daily  . omega-3 acid ethyl esters  1,000 mg Oral Daily  . pregabalin  25 mg Oral QHS  . sodium chloride flush  3 mL Intravenous Q12H  . sodium chloride flush  3 mL Intravenous Q12H  . ticagrelor  90 mg Oral BID  . umeclidinium-vilanterol  2 puff Inhalation QHS  . valACYclovir  1,000 mg Oral Daily   Continuous Infusions: . sodium chloride Stopped (07/04/19 0616)  . sodium chloride    . sodium chloride Stopped (07/04/19 0803)   PRN Meds:.sodium chloride, acetaminophen, albuterol, haloperidol lactate, sodium chloride flush  DVT prophylaxis: heparin Code Status: Full code Family Communication: daughter at bedside   Status is: Inpatient  Remains inpatient appropriate because:Undergoing  cardiac cath today   Dispo: The patient is from: Home              Anticipated d/c is to: Home              Anticipated d/c date is: 2 days              Patient currently is not medically stable to d/c.  Consultants:  Cardiology   Neurology  Procedures:  2D echo Cardiac catheterization 6/8  Microbiology  none  Antimicrobials: none    Objective: Vitals:   07/04/19 0817 07/04/19 0819 07/04/19 0932 07/04/19 1019  BP: 95/72   94/69  Pulse: 83 89 79 83  Resp: (!) 21 (!) 28  (!) 26  Temp: 98.3 F (36.8 C)   98.1 F (36.7 C)  TempSrc: Oral   Oral  SpO2: 99% 98%  100%  Weight:      Height:        Intake/Output Summary (Last 24 hours) at 07/04/2019 1113 Last data filed at 07/04/2019 1000 Gross per 24 hour  Intake 834.03 ml  Output 1200 ml  Net -365.97 ml   Filed Weights   07/02/19 0208 07/03/19 0300 07/04/19 0316  Weight: 77.9 kg 79.8 kg 85.3 kg    Examination:  Constitutional: Tachypneic Eyes: No icterus ENMT: mmm Neck: normal, supple Respiratory: Clear bilaterally, no wheezing, no crackles Cardiovascular: Regular rate and rhythm, 3/6 SEM.  No peripheral edema Abdomen: Soft, nontender, nondistended, bowel sounds positive Musculoskeletal: no clubbing / cyanosis.  Skin: No rashes appreciated Neurologic: Nonfocal   Data Reviewed: I have independently reviewed following labs and imaging studies   CBC: Recent Labs  Lab 07/01/19 1905 07/02/19 0445 07/03/19 0438 07/04/19 0427  WBC 7.9 9.8 11.3* 9.9  HGB 12.3* 11.2* 11.6* 10.2*  HCT 38.7* 34.4* 35.3* 30.3*  MCV 88.6 87.1 86.5 85.6  PLT 188 177 168 253   Basic Metabolic Panel: Recent Labs  Lab 07/01/19 1905 07/01/19 2320 07/02/19 0445 07/03/19 0438 07/04/19 0427  NA 138  --  141 140 142  K 4.0  --  4.3 3.5 3.4*  CL 108  --  115* 109 115*  CO2 20*  --  19* 22 21*  GLUCOSE 162*  --  120* 133* 127*  BUN 18  --  18 13 17   CREATININE 1.59*  --  1.37* 1.40* 1.68*  CALCIUM 9.3  --  8.8* 8.9 8.4*  MG  --  1.8  --   --   --    Liver Function Tests: Recent Labs  Lab 07/03/19 0438 07/04/19 0427  AST 173* 100*  ALT 76* 56*  ALKPHOS 74 61  BILITOT 0.7 1.0  PROT 7.5 6.6  ALBUMIN 2.7* 2.2*   Coagulation Profile: No results for  input(s): INR, PROTIME in the last 168 hours. HbA1C: No results for input(s): HGBA1C in the last 72 hours. CBG: No results for input(s): GLUCAP in the last 168 hours.  Recent Results (from the past 240 hour(s))  SARS Coronavirus 2 by RT PCR (hospital order, performed in [facility] hospital lab) Nasopharyngeal Nasopharyngeal Swab     Status: None   Collection Time: 07/01/19 11:55 PM   Specimen: Nasopharyngeal Swab  Result Value Ref Range Status   SARS Coronavirus 2 NEGATIVE NEGATIVE Final    Comment: (NOTE) SARS-CoV-2 target nucleic acids are NOT DETECTED. The SARS-CoV-2 RNA is generally detectable in upper and lower respiratory specimens during the acute phase of infection. The lowest concentration of SARS-CoV-2  viral copies this assay can detect is 250 copies / mL. A negative result does not preclude SARS-CoV-2 infection and should not be used as the sole basis for treatment or other patient management decisions.  A negative result may occur with improper specimen collection / handling, submission of specimen other than nasopharyngeal swab, presence of viral mutation(s) within the areas targeted by this assay, and inadequate number of viral copies (<250 copies / mL). A negative result must be combined with clinical observations, patient history, and epidemiological information. Fact Sheet for Patients:   StrictlyIdeas.no Fact Sheet for Healthcare Providers: BankingDealers.co.za This test is not yet approved or cleared  by the [place] FDA and has been authorized for detection and/or diagnosis of SARS-CoV-2 by FDA under an Emergency Use Authorization (EUA).  This EUA will remain in effect (meaning this test can be used) for the duration of the COVID-19 declaration under Section 564(b)(1) of the Act, 21 U.S.C. section 360bbb-3(b)(1), unless the authorization is terminated or revoked sooner. Performed at [facility],  [place] [address], [place], [place] [address]      Radiology Studies: CARDIAC CATHETERIZATION  Result Date: 07/03/2019  RV Branch-1 lesion is 85% stenosed.  RV Branch-2 lesion is 90% stenosed.  Prox Cx to Mid Cx lesion is 85% stenosed.  1st Mrg lesion is 70% stenosed.  1st Diag lesion is 100% stenosed.  Post intervention, there is a 0% residual stenosis.  Lat 1st Diag lesion is 50% stenosed.  A stent was successfully placed.  Multivessel coronary obstructive disease with a totally occluded diagonal 1 vessel at its ostium from the LAD; ramus intermediate vessel; 85% proximal stenosis in the left circumflex coronary artery with a small marginal branch arising superiorly from this stenosis with ostial narrowing of 70%; and large RCA with 85% ostial and 90% mid large marginal branch supplying the PDA territory. LVEDP 18 mmHg Successful PCI to the 100% occluded diagonal vessel with PTCA and ultimate stenting with a 2.25 x 22 mm Resolute Onyx DES stent doing and opening up a moderate size bifurcating diagonal vessel.  There is mild 50% ostial narrowing in the inferior branch of the diagonal vessel following its bifurcation. RECOMMENDATION: DAPT for minimum of 1 year.  The patient had troponin greater than 27,000 consistent with his diagonal occlusion which corresponds to the mild anterolateral ST elevation noted initially.  We will hydrate the patient well overnight.  If renal function and patient is stable consider staged PCI to the circumflex and RCA tomorrow afternoon.   DG CHEST PORT 1 VIEW  Result Date: 07/04/2019 CLINICAL DATA:  Shortness of breath EXAM: PORTABLE CHEST 1 VIEW COMPARISON:  07/01/2019 FINDINGS: Cardiac shadow is stable. Pacing device is again seen. Aortic calcifications are noted. Lungs are well aerated bilaterally. Slight increase in atelectatic changes are noted on the right. No sizable effusion is seen. IMPRESSION: Slight increase in right basilar atelectasis when compared with the prior  exam. Electronically Signed   By: Inez Catalina M.D.   On: 07/04/2019 09:20   Marzetta Board, MD, PhD [facility]  Between 7 am - 7 pm I am available, please contact me via Amion or Securechat  Between 7 pm - 7 am I am not available, please contact night coverage MD/APP via Amion

## 2019-07-04 NOTE — Progress Notes (Signed)
Daily Progress Note   Patient Name: Derek Carr       Date: 07/04/2019 DOB: 07-24-1931  Age: 86 y.o. MRN#: [id number] Attending Physician: Geralynn Rile, MD Primary Care Physician: [name], MD Admit Date: 07/01/2019   Reason for Consultation/Follow-up: Establishing goals of care  Subjective: Patient awake, alert, oriented able to participate in conversation but does become anxious and overwhelmed with discussion. Forgetful and unsure if [place] completely grasps complexity of medical decision making.   GOC:  Received call from RN on palliative team phone for urgent palliative follow-up following events from this morning.   Upon arrival to room, multiple family members present. Daughter, [name] is spokesperson and reported HCPOA. Robin requests we speak in the hallway at first. [name] is requesting PMT provider gently speak with her parents before Dr. Audie Box speaks with family as she does not wish for him to get overwhelmed. Dr. Audie Box arrived and further explained severity of Derek Carr's clinical condition and recommendation against heroic measures and focus on medical management/comfort/quality of life with high risk for mortality with surgical interventions, also explaining high risk for mortality due to unfixable problem. [name] is also requesting to speak with surgery about her father's condition.  [name] does prepare me that her mother has dementia and may repeat questions. Also, father can be forgetful.   Dr. Audie Box placed ICU transfer orders with tenuous clinical status.   While waiting on ICU bed, extensive GOC discussion with patient and multiple family members at bedside including the patient's wife and four children [name], [place], [place], and [place]) as well as their spouses.    Introduced role of palliative medicine. Prior to admission, patient living home with wife and daughter, [name].   Discussed in detail events leading up to admission, course of hospitalization including diagnoses, interventions, plan of care. Discussed new information this morning and poor prognosis/tenuous status.   I attempted to elicit values and goals of care important to the patient and family. Derek Carr is overwhelmed with this conversation. He appears anxious and intermittently starts to cry. Emotional support continuously provided.   Extensive code status discussion. Patient does acknowledge he would not desire long-term prolonging measures if terminally ill but cannot answer this afternoon his current wishes on resuscitation/life support measures. He tearfully shares he wants to be with his family. I do not  feel the patient fully comprehends conversation surrounding code status and his wishes.   Allowed children to share their thoughts on code status. [name] and Derek Carr share their desire for resuscitation to be attempted, sharing their belief that "God will make his declaration" if Derek Carr coded, meaning if it was God's will to take Derek Carr, a cardiac rhythm would not re-start. They wish for 'man to work' and God will make his declaration. Derek Carr does mention not wishing for her father to suffer or ribs to be broken. Frankly and compassionately shared fear that there may be suffering and ribs would be broken as we are aggressively trying to restart his heart. Encouraged ongoing discussions in the days to come, pending his clinical course and recommendations from cardiac surgeon.   I have explained comfort focused pathway with [name] and Malachy Mood, explaining focus on comfort, quality of life, good days with family, symptom management, and ensuring relief from suffering.   Reiterated plan to family that with full code/full scope treatment, Derek Carr will be transferred to ICU when bed available and pressors may need  to be considered. Explained fear of worsening respiratory decompensation with significant heart condition and that if his respiratory status worsened, he will be intubated and placed on life support machine. Family understands and at this point, desire full code/full scope treatment. Time for God to make his declaration.   Reassured of ongoing support from PMT provider and chaplain. Questions and concerns were answered to the best of my ability. Hard Choices booklet and PMT contact information given. Also explained visitor policy moving forward with continued aggressive management, just two visitors allowed. Family understands.    Length of Stay: 3  Current Medications: Scheduled Meds:  . vitamin C  1,000 mg Oral Daily  . [START ON 07/05/2019] aspirin EC  81 mg Oral Daily  . atorvastatin  40 mg Oral Daily  . carbidopa-levodopa  1 tablet Oral Daily  . cholecalciferol  5,000 Units Oral Daily  . folic acid  1 mg Oral Daily  . metoprolol tartrate  12.5 mg Oral BID  . multivitamin with minerals  1 tablet Oral Daily  . omega-3 acid ethyl esters  1,000 mg Oral Daily  . pregabalin  25 mg Oral QHS  . sodium chloride flush  3 mL Intravenous Q12H  . sodium chloride flush  3 mL Intravenous Q12H  . ticagrelor  90 mg Oral BID  . umeclidinium-vilanterol  2 puff Inhalation QHS  . valACYclovir  1,000 mg Oral Daily    Continuous Infusions: . sodium chloride Stopped (07/04/19 0616)  . sodium chloride    . sodium chloride Stopped (07/04/19 0803)    PRN Meds: sodium chloride, acetaminophen, albuterol, haloperidol lactate, sodium chloride flush  Physical Exam Vitals and nursing note reviewed.  Constitutional:      General: He is awake.     Appearance: He is ill-appearing.  HENT:     Head: Normocephalic and atraumatic.  Cardiovascular:     Heart sounds: Murmur present.  Pulmonary:     Effort: No tachypnea, accessory muscle usage or respiratory distress.  Skin:    General: Skin is warm and dry.   Neurological:     Mental Status: He is alert and oriented to person, place, and time.     Comments: Forgetful  Psychiatric:        Mood and Affect: Mood is anxious.             Vital Signs: BP 96/72 (BP Location: Left Arm)  Pulse 80   Temp 98.1 F (36.7 C) (Oral)   Resp (!) 27   Ht 6\' 3"  (1.905 m)   Wt 85.3 kg   SpO2 100%   BMI 23.50 kg/m  SpO2: SpO2: 100 % O2 Device: O2 Device: Room Air O2 Flow Rate: O2 Flow Rate (L/min): 2 L/min  Intake/output summary:   Intake/Output Summary (Last 24 hours) at 07/04/2019 1206 Last data filed at 07/04/2019 1000 Gross per 24 hour  Intake 834.03 ml  Output 1200 ml  Net -365.97 ml   LBM: Last BM Date: 07/02/19 Baseline Weight: Weight: 79.4 kg Most recent weight: Weight: 85.3 kg       Palliative Assessment/Data: PPS 40%      Patient Active Problem List   Diagnosis Date Noted  . Palliative care by specialist   . Goals of care, counseling/discussion   . AKI (acute kidney injury) ([place]) 07/02/2019  . Elevated CK 07/02/2019  . NSTEMI (non-ST elevated myocardial infarction) ([place]) 07/01/2019  . Sinus node dysfunction ([place]) 11/23/2018  . Pacemaker 11/23/2018  . Idiopathic progressive neuropathy 07/25/2018  . Vascular parkinsonism ([place]) 05/31/2018  . Bradycardia 09/16/2017  . CKD (chronic kidney disease), stage III 09/15/2017  . Near syncope 09/14/2017  . Post-operative pain   . Dementia without behavioral disturbance ([place])   . TIA (transient ischemic attack)   . Essential hypertension   . Bimalleolar fracture of left ankle 05/03/2017  . Axonal neuropathy 03/21/2017  . Right sided weakness 03/01/2017  . Cigarette smoker 06/16/2016  . Dyspnea on exertion 06/15/2016  . Alzheimer disease ([place]) 01/07/2014  . CSA (central sleep apnea) 10/05/2013  . Syncope 06/26/2013  . COPD GOLD II 06/25/2013  . BPH (benign prostatic hyperplasia) 05/18/2013  . Glaucoma 05/18/2013  . S/P right TH revision 05/14/2013  . Constipation 03/26/2013   . Osteoporosis 03/26/2013  . S/P right THA, AA 03/20/2013  . Hyperlipidemia   . Erectile dysfunction   . History of asbestos exposure   . Nocturia   . History of shingles   . Dermatitis, atopic   . OA (osteoarthritis) of hip   . Arthritis   . Frequency of urination   . Lumbar scoliosis   . Malignant neoplasm of prostate ([place]) 06/07/2011  . 86 year old gentleman with stage T3 adenocarcinoma prostate with Gleason score 4+4 and PSA of 10.3 05/11/2011    Palliative Care Assessment & Plan   Patient Profile: Per intake H&P --> Imanol Bihl Donahueis a 86 y.o.malewith AAA, SSS s/p ppm placement, neurodegenerative disorder (dementia, gait disturbance, tremor, etc), orthostatic hypotension, CKD 3, TIA who was admitted 07/01/2019 with weakness and fatigue. Initial concerns for stroke but CT head negative. Labs and echo consistent with posterior MI. S/p cardiac cath 6/8 with successful PCI to 100% occluded vessel. Potential repeat cath this admission for other areas of occlusion on initial cath, significant RCA/LCS disease. Palliative care was asked to get involved to help address goals of care.   On 6/9, patient with dyspnea, soft blood pressures, and new murmur noted on cardiology exam. Stat CXR, ECHO, and lasix ordered. Stat ECHO revealed severe mitral vegetation with tethering of posterior mitral valve leaflet with pseudoprolapse of anterior mitral valve. Pending surgical evaluation. Per Dr. Audie Box, poor prognosis and high risk for mortality. Ongoing palliative discussions for goals of care.   Assessment: NSTEMI Significant RCA/LCS disease  Acute mitral regurgitation New onset murmur AKI on CKD stage IIIa Acute metabolic encephalopathy complicated by neurocognitive decline Peripheral neuropathy COPD/tobacco use ? Sjorgren's  Recommendations/Plan:  Extensive GOC with patient, wife, four children. Desire for ongoing full code/full scope treatment. Family desires any offered and available  interventions to prolong life at this point.   Ongoing palliative discussions.  Pending cardiac surgery evaluation.   Transfer to ICU stat  Appreciate chaplain follow-up.   PMT will follow and continue to support patient/family.   Goals of Care and Additional Recommendations:  Limitations on Scope of Treatment: Full Scope Treatment  Code Status: FULL   Code Status Orders  (From admission, onward)         Start     Ordered   07/01/19 2320  Full code  Continuous     07/01/19 2319        Code Status History    Date Active Date Inactive Code Status Order ID Comments User Context   09/14/2017 1851 09/17/2017 2019 Full Code [id number]  Jani Gravel, MD ED   05/03/2017 1736 05/09/2017 1614 Full Code [id number]  Corky Sing, PA-C Inpatient   03/01/2017 2343 03/03/2017 2123 Full Code [id number]  Damita Lack, MD ED   05/14/2013 1737 05/17/2013 1636 Full Code [id number]  Pricilla Loveless, PA-C Inpatient   03/20/2013 1529 03/23/2013 1627 Full Code [id number]  Babish, Lucille Passy, PA-C Inpatient   Advance Care Planning Activity       Prognosis:  Poor prognosis: tenuous clinical condition and high risk for decompensation.  Discharge Planning:  To Be Determined  Care plan was discussed with RN, Dr. Audie Box, patient, extensive [place] meeting with multiple family members at bedside (wife, four children and their significant others).   Thank you for allowing the Palliative Medicine Team to assist in the care of this patient.   Time In: 1000 Time Out: 1200 Total Time 120 Prolonged Time Billed  yes      Greater than 50%  of this time was spent counseling and coordinating care related to the above assessment and plan.  Ihor Dow, DNP, FNP-C Palliative Medicine Team  Phone: [phone] Fax: [phone]  Please contact Palliative Medicine Team phone at [phone] for questions and concerns.

## 2019-07-04 NOTE — Progress Notes (Signed)
Arterial line attempted and not obtained. MD notified.

## 2019-07-04 NOTE — Care Management Important Message (Signed)
Important Message  Patient Details  Name: BRENTT FREAD MRN: [id number] Date of Birth: 1932/01/23   Medicare Important Message Given:  Yes     Shelda Altes 07/04/2019, 11:37 AM

## 2019-07-04 NOTE — Progress Notes (Signed)
NEUROLOGY PROGRESS NOTE   Subjective: No complaints at this time.  Apparently there was damage to his mitral valve during his heart attack to which daughter is very worried about.  Exam: Vitals:   07/04/19 1117 07/04/19 1228  BP: 96/72 92/61  Pulse: 80 82  Resp: (!) 27   Temp:  98.1 F (36.7 C)  SpO2: 100% 100%     Neuro:  Mental Status: Able to follow simple commands.  Again does not know the year, date and/or hospital.  He believes he is at home.  He is dysarthric today Cranial Nerves: II:  Visual fields grossly normal,  III,IV, VI: ptosis not present, extra-ocular motions intact bilaterally pupils equal, round, reactive to light and accommodation V,VII: Left facial droop noted Motor: Moving all extremities antigravity.  Decreased bulk in his quad strips and hamstrings.  As noted stronger distally than proximally    Medications:  Scheduled: . vitamin C  1,000 mg Oral Daily  . [START ON 07/05/2019] aspirin EC  81 mg Oral Daily  . atorvastatin  40 mg Oral Daily  . carbidopa-levodopa  1 tablet Oral Daily  . cholecalciferol  5,000 Units Oral Daily  . folic acid  1 mg Oral Daily  . multivitamin with minerals  1 tablet Oral Daily  . omega-3 acid ethyl esters  1,000 mg Oral Daily  . pregabalin  25 mg Oral QHS  . sodium chloride flush  3 mL Intravenous Q12H  . sodium chloride flush  3 mL Intravenous Q12H  . ticagrelor  90 mg Oral BID  . umeclidinium-vilanterol  2 puff Inhalation QHS  . valACYclovir  1,000 mg Oral Daily   Continuous: . sodium chloride Stopped (07/04/19 0616)  . sodium chloride    . sodium chloride Stopped (07/04/19 0803)    Pertinent Labs/Diagnostics: Thiamine pending SPEP pending  MR BRAIN WO CONTRAST  Result Date: 07/03/2019  IMPRESSION: 1. No evidence of acute intracranial abnormality. 2. Focus of encephalomalacia in the splenium of the corpus callosum on the left with increased signal on the diffusion-weighted images at its margins, without  corresponding low signal on ADC, consistent with T2 shine through. This infarct was seen on prior CT performed in July 01, 2019, but new since MRI performed in February 02, 2018. 3. Mild chronic small vessel ischemic changes. 4. Mild parenchymal volume loss. 5. Paranasal sinus disease as described. 6. Right mastoid effusion. Electronically Signed   By: Pedro Earls M.D.   On: 07/03/2019 11:32    Etta Quill PA-C [facility] [phone]  Assessment:  This is a 86 year old male presented to the hospital with sudden increase in falls over the past weekend, progressive decline in cognition, and sensorymotor neuropathy.  At this point I continue to believe there is clearly a multifactorial aspect of his decline and with the new information of decreased vision along with inability to feel the floor I feel like this plays a large role in his falls.  He was explained to daughter that the MRI did not show any acute abnormalities.  It was also explained that patient will need to have physical therapy and follow-up with Dr. Delice Lesch.  Impression: -Falls -Lower extremity weakness and atrophy -Deconditioning  Recommendations: -Follow-up with Dr. Delice Lesch as an outpatient in which copper, SPEP and thiamine levels can be followed up.   07/04/2019, 1:02 PM

## 2019-07-04 NOTE — Progress Notes (Signed)
PT Cancellation Note  Patient Details Name: Derek Carr MRN: [id number] DOB: 02/28/1931   Cancelled Treatment:    Reason Eval/Treat Not Completed: (P) Medical issues which prohibited therapy Pt has had a change in medical status and is transferring to [place]. PT will check back for evaluation tomorrow.  [name] B. [name] PT, DPT Acute Rehabilitation Services Pager [phone] Office [phone]    [place] 07/04/2019, 12:05 PM

## 2019-07-04 NOTE — Progress Notes (Signed)
  Echocardiogram 2D Echocardiogram limited has been performed.  Jennette Dubin 07/04/2019, 8:59 AM

## 2019-07-04 NOTE — Progress Notes (Signed)
OT Cancellation Note  Patient Details Name: ADIN LAKER MRN: [id number] DOB: 1931/12/23   Cancelled Treatment:    Reason Eval/Treat Not Completed: (P) Medical issues which prohibited therapy. Medical decline. Patient moved to ICU. OT will continue efforts toward completion of evaluation when patient is medically stable.   Damontre Millea R [place] 07/04/2019, 12:19 PM

## 2019-07-04 NOTE — Progress Notes (Signed)
Cardiology Progress Note  Patient ID: Derek Carr MRN: [id number] DOB: November 09, 1931 Date of Encounter: 07/04/2019  Primary Cardiologist: Cristopher Peru, MD  Subjective   Chief Complaint: SOB  HPI: S/p PCI to 100% occluded D1 yesterday. Significant RCA/LCX disease. SOB this AM. Bps soft. New murmur. Stat CXR, stat echo, and lasix ordered.   ROS:  All other ROS reviewed and negative. Pertinent positives noted in the HPI.     Inpatient Medications  Scheduled Meds: . vitamin C  1,000 mg Oral Daily  . [START ON 07/05/2019] aspirin EC  81 mg Oral Daily  . atorvastatin  40 mg Oral Daily  . carbidopa-levodopa  1 tablet Oral Daily  . cholecalciferol  5,000 Units Oral Daily  . folic acid  1 mg Oral Daily  . furosemide  60 mg Intravenous Once  . metoprolol tartrate  12.5 mg Oral BID  . multivitamin with minerals  1 tablet Oral Daily  . omega-3 acid ethyl esters  1,000 mg Oral Daily  . potassium chloride  40 mEq Oral Once  . pregabalin  25 mg Oral QHS  . sodium chloride flush  3 mL Intravenous Q12H  . sodium chloride flush  3 mL Intravenous Q12H  . ticagrelor  90 mg Oral BID  . umeclidinium-vilanterol  2 puff Inhalation QHS  . valACYclovir  1,000 mg Oral Daily   Continuous Infusions: . sodium chloride Stopped (07/04/19 0616)  . sodium chloride    . sodium chloride Stopped (07/04/19 0803)   PRN Meds: sodium chloride, acetaminophen, albuterol, haloperidol lactate, sodium chloride flush   Vital Signs   Vitals:   07/03/19 1916 07/03/19 2208 07/03/19 2318 07/04/19 0316  BP: 106/76 94/62 99/63  102/64  Pulse: 86 89 84 95  Resp: 16  (!) 32 (!) 25  Temp: 98 F (36.7 C)  98.1 F (36.7 C) 97.9 F (36.6 C)  TempSrc: Oral  Oral Oral  SpO2: 100%  100% 100%  Weight:    85.3 kg  Height:        Intake/Output Summary (Last 24 hours) at 07/04/2019 0810 Last data filed at 07/04/2019 0630 Gross per 24 hour  Intake 534.03 ml  Output 750 ml  Net -215.97 ml   Last 3 Weights 07/04/2019  07/03/2019 07/02/2019  Weight (lbs) 188 lb 176 lb 171 lb 11.8 oz  Weight (kg) 85.276 kg 79.833 kg 77.9 kg      Telemetry  Overnight telemetry shows SR 60-80 bpm, PAC/PVCs, which I personally reviewed.   ECG  The most recent ECG shows RBBB/LAFB HR 80, minimal ST elevation in anterolateral leads, which I personally reviewed.   Physical Exam   Vitals:   07/03/19 1916 07/03/19 2208 07/03/19 2318 07/04/19 0316  BP: 106/76 94/62 99/63  102/64  Pulse: 86 89 84 95  Resp: 16  (!) 32 (!) 25  Temp: 98 F (36.7 C)  98.1 F (36.7 C) 97.9 F (36.6 C)  TempSrc: Oral  Oral Oral  SpO2: 100%  100% 100%  Weight:    85.3 kg  Height:         Intake/Output Summary (Last 24 hours) at 07/04/2019 0810 Last data filed at 07/04/2019 0630 Gross per 24 hour  Intake 534.03 ml  Output 750 ml  Net -215.97 ml    Last 3 Weights 07/04/2019 07/03/2019 07/02/2019  Weight (lbs) 188 lb 176 lb 171 lb 11.8 oz  Weight (kg) 85.276 kg 79.833 kg 77.9 kg    Body mass index is 23.5 kg/m.   General: +  SOB Head: Atraumatic, normal size  Eyes: PEERLA, EOMI  Neck: Supple, JVD 7-10 cm H20 Endocrine: No thryomegaly Cardiac: Normal S1, S2; 3/6 HSM Lungs: +rales  Abd: Soft, nontender, no hepatomegaly  Ext: No edema, pulses 2+ Musculoskeletal: No deformities, BUE and BLE strength normal and equal Skin: Warm and dry, no rashes   Neuro: Alert and oriented to person, place only   Labs  High Sensitivity Troponin:   Recent Labs  Lab 07/01/19 1905 07/01/19 2040 07/01/19 2240  TROPONINIHS >27,000* >27,000* >27,000*     Cardiac EnzymesNo results for input(s): TROPONINI in the last 168 hours. No results for input(s): TROPIPOC in the last 168 hours.  Chemistry Recent Labs  Lab 07/02/19 0445 07/03/19 0438 07/04/19 0427  NA 141 140 142  K 4.3 3.5 3.4*  CL 115* 109 115*  CO2 19* 22 21*  GLUCOSE 120* 133* 127*  BUN 18 13 17   CREATININE 1.37* 1.40* 1.68*  CALCIUM 8.8* 8.9 8.4*  PROT  --  7.5 6.6  ALBUMIN  --  2.7* 2.2*    AST  --  173* 100*  ALT  --  76* 56*  ALKPHOS  --  74 61  BILITOT  --  0.7 1.0  GFRNONAA 47* 45* 36*  GFRAA 54* 53* 42*  ANIONGAP 7 9 6     Hematology Recent Labs  Lab 07/02/19 0445 07/03/19 0438 07/04/19 0427  WBC 9.8 11.3* 9.9  RBC 3.95* 4.08* 3.54*  HGB 11.2* 11.6* 10.2*  HCT 34.4* 35.3* 30.3*  MCV 87.1 86.5 85.6  MCH 28.4 28.4 28.8  MCHC 32.6 32.9 33.7  RDW 15.1 15.2 14.8  PLT 177 168 157   BNPNo results for input(s): BNP, PROBNP in the last 168 hours.  DDimer No results for input(s): DDIMER in the last 168 hours.   Radiology  MR BRAIN WO CONTRAST  Result Date: 07/03/2019 CLINICAL DATA:  Encephalopathy. EXAM: MRI HEAD WITHOUT CONTRAST TECHNIQUE: Multiplanar, multiecho pulse sequences of the brain and surrounding structures were obtained without intravenous contrast. COMPARISON:  Head CT July 01, 2019 FINDINGS: Brain: No acute infarction, hemorrhage, hydrocephalus, extra-axial collection or mass lesion. Focus of encephalomalacia in the splenium of the corpus callosum on the left with increased signal on the diffusion-weighted images at its margins, without corresponding low signal on ADC, consistent with T2 shine through. This infarct was seen on prior CT performed in July 01, 2019, but new since MRI performed in February 02, 2018. Scattered foci of T2 hyperintensity are seen within the white matter of cerebral hemispheres and within pons, nonspecific, most likely related to chronic small vessel ischemia. Prominence of the cerebral sulci reflecting mild parenchymal volume loss. Vascular: Normal flow voids. Skull and upper cervical spine: Normal marrow signal. Sinuses/Orbits: Bilateral lens surgery. Mucosal thickening of the bilateral ethmoid cells and right maxillary sinus. Other: Right mastoid effusion. IMPRESSION: 1. No evidence of acute intracranial abnormality. 2. Focus of encephalomalacia in the splenium of the corpus callosum on the left with increased signal on the  diffusion-weighted images at its margins, without corresponding low signal on ADC, consistent with T2 shine through. This infarct was seen on prior CT performed in July 01, 2019, but new since MRI performed in February 02, 2018. 3. Mild chronic small vessel ischemic changes. 4. Mild parenchymal volume loss. 5. Paranasal sinus disease as described. 6. Right mastoid effusion. Electronically Signed   By: Pedro Earls M.D.   On: 07/03/2019 11:32   CARDIAC CATHETERIZATION  Result Date: 07/03/2019  RV  Branch-1 lesion is 85% stenosed.  RV Branch-2 lesion is 90% stenosed.  Prox Cx to Mid Cx lesion is 85% stenosed.  1st Mrg lesion is 70% stenosed.  1st Diag lesion is 100% stenosed.  Post intervention, there is a 0% residual stenosis.  Lat 1st Diag lesion is 50% stenosed.  A stent was successfully placed.  Multivessel coronary obstructive disease with a totally occluded diagonal 1 vessel at its ostium from the LAD; ramus intermediate vessel; 85% proximal stenosis in the left circumflex coronary artery with a small marginal branch arising superiorly from this stenosis with ostial narrowing of 70%; and large RCA with 85% ostial and 90% mid large marginal branch supplying the PDA territory. LVEDP 18 mmHg Successful PCI to the 100% occluded diagonal vessel with PTCA and ultimate stenting with a 2.25 x 22 mm Resolute Onyx DES stent doing and opening up a moderate size bifurcating diagonal vessel.  There is mild 50% ostial narrowing in the inferior branch of the diagonal vessel following its bifurcation. RECOMMENDATION: DAPT for minimum of 1 year.  The patient had troponin greater than 27,000 consistent with his diagonal occlusion which corresponds to the mild anterolateral ST elevation noted initially.  We will hydrate the patient well overnight.  If renal function and patient is stable consider staged PCI to the circumflex and RCA tomorrow afternoon.   ECHOCARDIOGRAM COMPLETE  Result Date: 07/02/2019     ECHOCARDIOGRAM REPORT   Patient Name:   Derek Carr [facility] Date of Exam: 07/02/2019 Medical Rec #:  [id number]      Height:       75.0 in Accession #:    [id number]     Weight:       171.7 lb Date of Birth:  09-19-1931     BSA:          2.057 m Patient Age:    49 years       BP:           137/80 mmHg Patient Gender: M              HR:           71 bpm. Exam Location:  Inpatient Procedure: 2D Echo and Intracardiac Opacification Agent Indications:    Acute myocardial infarction 410  History:        Patient has prior history of Echocardiogram examinations, most                 recent 09/15/2017. Stroke; Risk Factors:Hypertension and                 Dyslipidemia. AAA. Past history of cancer.  Sonographer:    Darlina Sicilian RDCS Referring Phys: [phone] ADAM S BARNETT IMPRESSIONS  1. Left ventricular ejection fraction, by estimation, is 40 to 45%. The left ventricle has mildly decreased function. The left ventricle demonstrates regional wall motion abnormalities (see scoring diagram/findings for description). There is mild left ventricular hypertrophy. Left ventricular diastolic parameters are consistent with Grade I diastolic dysfunction (impaired relaxation). There is severe akinesis of the left ventricular, entire inferior wall, inferolateral wall and lateral wall, suggestive of LCx territory infarct.  2. Right ventricular systolic function is mildly reduced. The right ventricular size is normal. There is normal pulmonary artery systolic pressure.  3. The mitral valve is grossly normal. Trivial mitral valve regurgitation.  4. The aortic valve is tricuspid. Aortic valve regurgitation is not visualized. Comparison(s): Changes from prior study are noted. 09/15/2017: LVEF 55-60%. FINDINGS  Left Ventricle: Left  ventricular ejection fraction, by estimation, is 40 to 45%. The left ventricle has mildly decreased function. The left ventricle demonstrates regional wall motion abnormalities. Severe akinesis of the left ventricular,  entire inferior wall, inferolateral wall and lateral wall. Definity contrast agent was given IV to delineate the left ventricular endocardial borders. The left ventricular internal cavity size was normal in size. There is mild left ventricular hypertrophy. Left  ventricular diastolic parameters are consistent with Grade I diastolic dysfunction (impaired relaxation). Indeterminate filling pressures. Right Ventricle: The right ventricular size is normal. No increase in right ventricular wall thickness. Right ventricular systolic function is mildly reduced. There is normal pulmonary artery systolic pressure. The tricuspid regurgitant velocity is 1.67 m/s, and with an assumed right atrial pressure of 8 mmHg, the estimated right ventricular systolic pressure is 00.1 mmHg. Left Atrium: Left atrial size was normal in size. Right Atrium: Right atrial size was normal in size. Pericardium: There is no evidence of pericardial effusion. Mitral Valve: The mitral valve is grossly normal. Trivial mitral valve regurgitation. Tricuspid Valve: The tricuspid valve is grossly normal. Tricuspid valve regurgitation is trivial. Aortic Valve: The aortic valve is tricuspid. Aortic valve regurgitation is not visualized. Pulmonic Valve: The pulmonic valve was not well visualized. Pulmonic valve regurgitation is not visualized. Aorta: The aortic root and ascending aorta are structurally normal, with no evidence of dilitation. IAS/Shunts: No atrial level shunt detected by color flow Doppler. Additional Comments: A pacer wire is visualized.  LEFT VENTRICLE PLAX 2D LVOT diam:     1.90 cm      Diastology LV SV:         52           LV e' lateral:   6.74 cm/s LV SV Index:   25           LV E/e' lateral: 8.3 LVOT Area:     2.84 cm     LV e' medial:    4.35 cm/s                             LV E/e' medial:  12.9  LV Volumes (MOD) LV vol d, MOD A2C: 172.0 ml LV vol d, MOD A4C: 154.0 ml LV vol s, MOD A2C: 91.9 ml LV vol s, MOD A4C: 85.5 ml LV SV MOD  A2C:     80.1 ml LV SV MOD A4C:     154.0 ml LV SV MOD BP:      74.8 ml RIGHT VENTRICLE RV S prime:     9.25 cm/s TAPSE (M-mode): 2.0 cm LEFT ATRIUM             Index LA Vol (A2C):   64.5 ml 31.36 ml/m LA Vol (A4C):   50.0 ml 24.31 ml/m LA Biplane Vol: 61.1 ml 29.71 ml/m  AORTIC VALVE LVOT Vmax:   105.00 cm/s LVOT Vmean:  68.200 cm/s LVOT VTI:    0.182 m  AORTA Ao Root diam: 3.10 cm MITRAL VALVE               TRICUSPID VALVE MV Area (PHT): 4.68 cm    TR Peak grad:   11.2 mmHg MV Decel Time: 162 msec    TR Vmax:        167.00 cm/s MV E velocity: 56.20 cm/s MV A velocity: 74.40 cm/s  SHUNTS MV E/A ratio:  0.76        Systemic VTI:  0.18 m  Systemic Diam: 1.90 cm Lyman Bishop MD Electronically signed by Lyman Bishop MD Signature Date/Time: 07/02/2019/11:44:17 AM    Final     Cardiac Studies  LHC 07/03/2019  RV Branch-1 lesion is 85% stenosed.  RV Branch-2 lesion is 90% stenosed.  Prox Cx to Mid Cx lesion is 85% stenosed.  1st Mrg lesion is 70% stenosed.  1st Diag lesion is 100% stenosed.  Post intervention, there is a 0% residual stenosis.  Lat 1st Diag lesion is 50% stenosed.  A stent was successfully placed.   Multivessel coronary obstructive disease with a totally occluded diagonal 1 vessel at its ostium from the LAD; ramus intermediate vessel; 85% proximal stenosis in the left circumflex coronary artery with a small marginal branch arising superiorly from this stenosis with ostial narrowing of 70%; and large RCA with 85% ostial and 90% mid large marginal branch supplying the PDA territory.  LVEDP 18 mmHg  Successful PCI to the 100% occluded diagonal vessel with PTCA and ultimate stenting with a 2.25 x 22 mm Resolute Onyx DES stent doing and opening up a moderate size bifurcating diagonal vessel.  There is mild 50% ostial narrowing in the inferior branch of the diagonal vessel following its bifurcation.  RECOMMENDATION: DAPT for minimum of 1 year.  The  patient had troponin greater than 27,000 consistent with his diagonal occlusion which corresponds to the mild anterolateral ST elevation noted initially.  We will hydrate the patient well overnight.  If renal function and patient is stable consider staged PCI to the circumflex and RCA tomorrow afternoon.  TTE 07/02/2019 1. Left ventricular ejection fraction, by estimation, is 40 to 45%. The  left ventricle has mildly decreased function. The left ventricle  demonstrates regional wall motion abnormalities (see scoring  diagram/findings for description). There is mild left  ventricular hypertrophy. Left ventricular diastolic parameters are  consistent with Grade I diastolic dysfunction (impaired relaxation). There  is severe akinesis of the left ventricular, entire inferior wall,  inferolateral wall and lateral wall,  suggestive of LCx territory infarct.  2. Right ventricular systolic function is mildly reduced. The right  ventricular size is normal. There is normal pulmonary artery systolic  pressure.  3. The mitral valve is grossly normal. Trivial mitral valve  regurgitation.  4. The aortic valve is tricuspid. Aortic valve regurgitation is not  visualized.   Patient Profile  Derek Carr is a 86 y.o. male with AAA, SSS s/p ppm placement, neurodegenerative disorder (dementia, gait disturbance, tremor, etc), orthostatic hypotension, CKD 3, TIA who was admitted 07/01/2019 with weakness and fatigue. Initial concerns for stroke but CT head negative. Labs and echo consistent with posterior MI.   Assessment & Plan   1. NSTEMI/Systolic HF EF 68-12% -had minimal anterolateral ST elevation. Inferolateral wall out. 100% occluded D1 s/p PCI. Significant RCA/LCX disease that needs staged PCI.  -Cr bumped and SOB this AM; concerns for mechanical complication of MI (acute MR possible based on territory and exam). Stat EKG, CXR, and stat echo -will give 60 mg IV lasix -continue ticagrelor/ASA -EF  40-45% with posterior MI -continue metoprolol tartrate -we will plan for staged PCI Friday with Dr. Claiborne Billings pending renal function  -no ACE/ARB due to Cr.  2. SOB/Tachypnea/New murmur -suspect ischemic MR. Could just be volume from cath. Will get stat CXR, echo, and EKG. Lasix 60 mg IV.  -will need to rule out mechanical complication with echo. Hopefully, nothing sinister is found.   For questions or updates, please contact [place] Please consult  [email or web address] for contact info under   Time Spent with Patient: I have spent a total of 25 minutes with patient reviewing hospital notes, telemetry, EKGs, labs and examining the patient as well as establishing an assessment and plan that was discussed with the patient.  > 50% of time was spent in direct patient care.    Signed, Addison Naegeli. Audie Box, [place]  07/04/2019 8:10 AM

## 2019-07-04 NOTE — Consult Note (Addendum)
[address]       [place] [address]             [phone]          CARDIOTHORACIC SURGERY CONSULTATION REPORT  PCP is [name], MD Referring Provider is Eleonore Chiquito, MD Primary Cardiologist is Cristopher Peru, MD  Reason for consultation:  Acute mitral regurgitation  HPI:  Patient is an 86 year old African-American male with history of chronic neurodegenerative disorder of unclear etiology (characterized by dementia, gait imbalance, tremor, and peripheral neuropathy), sinus node dysfunction status post permanent pacemaker placement, CKD-III, orthostatic hypotension, sleep apnea,TIAs, abdominal aortic aneurysm, and prostate cancer who was admitted to the hospital with symptoms of generalized weakness and fatigue without chest pain and subsequently diagnosed with likely recent out of hospital acute ST segment elevation myocardial infarction and underwent PCI and stenting of occluded diagonal branch yesterday who now has developed what is felt to be acute onset of mitral regurgitation.  Patient's history is obtained primarily from the medical record and the patient is interviewed at the bedside and is hospital room.  Currently there are no family members present for consultation.  Patient has documented history of chronic neurodegenerative disorder of unclear etiology for which she is followed by Dr. Delice Lesch.  The patient lives at home with his wife but has very limited mobility and at least mild dementia.  He has no previous history of coronary artery disease.  He was admitted to the hospital with a 2-day history of worsening confusion, fatigue, and generalized weakness.  Lab work in the emergency department was notable for markedly elevated troponin levels.  EKGs revealed sinus rhythm with longstanding right bundle branch block and subtle ST segment elevation that did not meet criteria for ST segment elevation myocardial infarction.  CT and CT angiography of the head  followed by MRI of the brain were performed and ruled out evidence of acute stroke.  CT angiography of the head did reveal multifocal moderate to severe stenosis of the intracranial circulation.  Baseline echocardiogram performed July 02, 2019 revealed decreased left ventricular systolic function with ejection fraction estimated 40 to 45%.  There was severe akinesis of the entire inferior and inferolateral wall.  There was trivial mitral regurgitation.  Diagnostic cardiac catheterization performed July 03, 2019 revealed multivessel coronary artery disease with totally occluded first diagonal branch of the left anterior descending coronary artery that appeared recent.  The patient also had 85% proximal stenosis of the left circumflex coronary artery with only small terminal marginal branches in the left circumflex territory.  There was large right coronary artery with 85% ostial stenosis and 90% stenosis of the distal branch of the right coronary artery.  Left ventricular end-diastolic pressure was 18 mmHg.  The patient underwent PCI and stenting of the occluded diagonal branch.  The patient has been anticoagulated using Brilinta since PCI was performed.  Earlier today the patient was noted to have new systolic murmur on physical exam.  Transthoracic echocardiogram revealed what appears to be likely severe mitral regurgitation with an eccentric jet of regurgitation directed around the posterior wall of the left atrium.  Left ventricular systolic function appeared essentially unchanged from previously.  Cardiothoracic surgical consultation was requested.  Patient is married and lives locally with his wife.  No family members are currently present for consultation.  The patient is able to recall the names of his children and his wife.  He does not recall the street he lives on.  He denies any chest pain.  He thinks he might have some shortness of breath but he otherwise states that he feels well presently.  He does not  know that he is in the hospital.  He follows simple commands with all 4 extremities but admits that he is too weak to stand up.  Past Medical History:  Diagnosis Date  . AAA (abdominal aortic aneurysm) ([place])   . Anemia   . Ankle fracture    left  . Aortic regurgitation   . Atherosclerosis   . Complication of anesthesia    CONFUSION - Pt's family very concerned about this  . DDD (degenerative disc disease)   . Degenerative arthritis   . Dermatitis, atopic ARMS AND LEGS  . Diverticulosis   . Erectile dysfunction   . Frequency of urination   . Glaucoma   . History of asbestos exposure   . History of radiation therapy 8/19-13-10/18/11   prostate, 44 GY  . History of shingles 2012-- BILATERAL EYES--  NO RESIDUAL  . Hyperlipidemia   . Hypertension   . Inguinal hernia   . Lumbar scoliosis   . Nocturia   . OA (osteoarthritis) of hip RIGHT  . Prostate cancer ([place]) 05/11/2011   bx=Adenocarcinoma,gleason3+4=7, 4+4=8,PSA=10.30volume=45.7cc  . Renal cyst    bilateral  . Smokers' cough ([place])   . Stroke ([place])   . Thyroid cyst     Past Surgical History:  Procedure Laterality Date  . ATTEMPTED LEFT VATS/ LEFT THORACOTOMY/ RESECTION OF THE ENORMOUS, PROBABLE BRONCHOGENIC CYST  01-04-2005  DR Arlyce Dice  . CARDIAC CATHETERIZATION  02-24-2009  DR NASHER   MINOR CORONARY ARTERY IRREGULARITIES/ NORMAL LVSF/ EF 65-70%  . CATARACT EXTRACTION    . COLONOSCOPY W/ POLYPECTOMY    . CORONARY STENT INTERVENTION N/A 07/03/2019   Procedure: CORONARY STENT INTERVENTION;  Surgeon: Troy Sine, MD;  Location: [facility];  Service: Cardiovascular;  Laterality: N/A;  . CYSTOSCOPY  11/15/2011   Procedure: CYSTOSCOPY;  Surgeon: Dutch Gray, MD;  Location[facility];  Service: Urology;  Laterality: N/A;  no seeds seen in bladder  . esophageus cyst removal  YRS AGO  . LEFT HEART CATH AND CORONARY ANGIOGRAPHY N/A 07/03/2019   Procedure: LEFT HEART CATH AND CORONARY ANGIOGRAPHY;  Surgeon:  Troy Sine, MD;  Location: [facility];  Service: Cardiovascular;  Laterality: N/A;  . LUMBAR SPINE SURGERY  1991  . ORIF ANKLE FRACTURE Left 05/03/2017  . ORIF ANKLE FRACTURE Left 05/03/2017   Procedure: OPEN REDUCTION INTERNAL FIXATION (ORIF) BIMALLEOLAR  ANKLE FRACTURE;  Surgeon: Wylene Simmer, MD;  Location: [facility];  Service: Orthopedics;  Laterality: Left;  . PACEMAKER IMPLANT N/A 09/16/2017   Procedure: PACEMAKER IMPLANT;  Surgeon: Constance Haw, MD;  Location: [facility];  Service: Cardiovascular;  Laterality: N/A;  . PROSTATE BIOPSY  05/11/11   Adenocarcinoma ([facility])  . RADIOACTIVE SEED IMPLANT  11/15/2011   Procedure: RADIOACTIVE SEED IMPLANT;  Surgeon: Dutch Gray, MD;  Location: [facility];  Service: Urology;  Laterality: N/A;  Total number of seeds - 52  . REPAIR RIGHT INGUINAL HERNIA W/ MESH  09-10-1999  . TOTAL HIP ARTHROPLASTY Right 03/20/2013   Procedure: RIGHT TOTAL HIP ARTHROPLASTY ANTERIOR APPROACH;  Surgeon: [name], MD;  Location: [facility];  Service: Orthopedics;  Laterality: Right;  . TOTAL HIP REVISION Right 05/14/2013   Procedure: open reduction internal fixation REVISION RIGHT HIP ;  Surgeon: [name], MD;  Location: [facility];  Service: Orthopedics;  Laterality: Right;  . UMBILICAL HERNIA REPAIR  1998   epigastric    Family History  Problem Relation Age of Onset  . Hypertension Mother   . Prostate cancer Brother        surgery/cured  . Lung cancer Brother        new primary/same brother    Social History   Socioeconomic History  . Marital status: Married    Spouse name: Alison Murray  . Number of children: 4  . Years of education: College  . Highest education level: Not on file  Occupational History  . Occupation:       Employer: [employer]    Comment: Retired  Tobacco Use  . Smoking status: Former Smoker    Packs/day: 0.50    Years: 62.00    Pack years: 31.00    Types: Cigarettes  . Smokeless  tobacco: Never Used  . Tobacco comment: smoke one or two per day  Substance and Sexual Activity  . Alcohol use: No    Alcohol/week: 1.0 standard drinks    Types: 1 Cans of beer per week  . Drug use: No    Comment: quit smoking 02/2012  . Sexual activity: Not Currently    Partners: Female  Other Topics Concern  . Not on file  Social History Narrative   Patient is Married Designer, television/film set), and lives at home with his wife 49 years   Patient has 3 daughters and 1 son.   Patient has a college education.   Patient is right-handed.   Patient drinks one cup of coffee, 1-2 cups of tea daily and rarely drinks soda.   Retired from [employer] Strain:   . Difficulty of Paying Living Expenses:   Food Insecurity:   . Worried About Charity fundraiser in the Last Year:   . Arboriculturist in the Last Year:   Transportation Needs:   . Film/video editor (Medical):   Marland Kitchen Lack of Transportation (Non-Medical):   Physical Activity:   . Days of Exercise per Week:   . Minutes of Exercise per Session:   Stress:   . Feeling of Stress :   Social Connections:   . Frequency of Communication with Friends and Family:   . Frequency of Social Gatherings with Friends and Family:   . Attends Religious Services:   . Active Member of Clubs or Organizations:   . Attends Archivist Meetings:   Marland Kitchen Marital Status:   Intimate Partner Violence:   . Fear of Current or Ex-Partner:   . Emotionally Abused:   Marland Kitchen Physically Abused:   . Sexually Abused:     Prior to Admission medications   Medication Sig Start Date End Date Taking? Authorizing Provider  acetaminophen (TYLENOL) 325 MG tablet Take 325-650 mg by mouth See admin instructions. Take 2 tablets (655m) in the morning, and 1 tablet (326m at night   Yes [provider]  Ascorbic Acid (VITAMIN C) 1000 MG tablet Take 1,000 mg by mouth daily.   Yes [provider]  aspirin EC 81 MG  tablet Take 81 mg by mouth daily.   Yes [provider]  b complex vitamins tablet Take 1 tablet by mouth daily.   Yes [provider]  b complex vitamins tablet Take 1 tablet by mouth daily.   Yes [provider]  Calcium Carbonate Antacid (TUMS PO) Take 1  tablet by mouth as needed (reflux/heartburn).    Yes [provider]  carbidopa-levodopa (SINEMET IR) 25-100 MG tablet Take 1 tablet daily 01/09/19  Yes Cameron Sprang, MD  Cholecalciferol (VITAMIN D-3) 125 MCG (5000 UT) TABS Take 5,000 Units by mouth daily.    Yes [provider]  clobetasol cream (TEMOVATE) 1.61 % Apply 1 application topically daily as needed (rash/irritation).  09/07/17  Yes [provider]  Coenzyme Q10 (COQ10 PO) Take 1 tablet by mouth daily.   Yes [provider]  folic acid (FOLVITE) 1 MG tablet Take 1 mg by mouth daily.   Yes [provider]  hydroxychloroquine (PLAQUENIL) 200 MG tablet Take 200 mg by mouth daily.  04/28/19  Yes [provider]  lidocaine-prilocaine (EMLA) cream Apply 1 application topically as needed. Patient taking differently: Apply 1 application topically as needed (pain).  06/08/18  Yes Dohmeier, Asencion Partridge, MD  meclizine (ANTIVERT) 12.5 MG tablet Take 1 tablet as needed for dizziness Patient taking differently: Take 12.5 mg by mouth daily as needed for dizziness.  01/09/19  Yes Cameron Sprang, MD  NONFORMULARY OR COMPOUNDED ITEM Apply 1 application topically daily as needed (nerve pain). [facility] Neuropathy Cream 120 grams 3 refills  Sent in 07-04-18    Yes [provider]  Omega 3 1000 MG CAPS Take 1,000 mg by mouth daily.    Yes [provider]  pregabalin (LYRICA) 25 MG capsule Take 1 capsule every night Patient taking differently: Take 25 mg by mouth at bedtime.  06/26/19  Yes Cameron Sprang, MD  Probiotic Product (PROBIOTIC PO) Take 1 capsule by mouth daily.    Yes [provider]    umeclidinium-vilanterol (ANORO ELLIPTA) 62.5-25 MCG/INH AEPB Only open the device one time and then take your two separate drags to be sure you get it all Patient taking differently: Inhale 2 puffs into the lungs at bedtime. Only open the device one time and then take your two separate drags to be sure you get it all 01/19/18  Yes Tanda Rockers, MD  valACYclovir (VALTREX) 1000 MG tablet Take 1,000 mg by mouth daily.   Yes [provider]  zinc gluconate 50 MG tablet Take 50 mg by mouth daily.   Yes [provider]  UNABLE TO FIND Med Name: CPAP per Dr Beacher May    [provider]    Current Facility-Administered Medications  Medication Dose Route Frequency Provider Last Rate Last Admin  . 0.9 %  sodium chloride infusion   Intravenous Continuous Carnicelli, Earnstine Regal, MD   Stopped at 07/04/19 [phone]  . 0.9 %  sodium chloride infusion  250 mL Intravenous PRN Troy Sine, MD      . 0.9 %  sodium chloride infusion   Intravenous Continuous Troy Sine, MD   Stopped at 07/04/19 [phone]  . acetaminophen (TYLENOL) tablet 650 mg  650 mg Oral Q6H PRN Tu, Ching T, DO   650 mg at 07/03/19 0411  . albuterol (PROVENTIL) (2.5 MG/3ML) 0.083% nebulizer solution 2.5 mg  2.5 mg Nebulization Q6H PRN Hongalgi, Anand D, MD      . ascorbic acid (VITAMIN C) tablet 1,000 mg  1,000 mg Oral Daily Modena Jansky, MD   1,000 mg at 07/02/19 1827  . [START ON 07/05/2019] aspirin EC tablet 81 mg  81 mg Oral Daily Gherghe, Costin M, MD      . atorvastatin (LIPITOR) tablet 40 mg  40 mg Oral Daily Garrison Columbus  S, MD   40 mg at 07/04/19 0927  . carbidopa-levodopa (SINEMET IR) 25-100 MG per tablet immediate release 1 tablet  1 tablet Oral Daily Tu, Ching T, DO   1 tablet at 07/04/19 0927  . cholecalciferol (VITAMIN D3) tablet 5,000 Units  5,000 Units Oral Daily Modena Jansky, MD   5,000 Units at 07/02/19 1828  . folic acid (FOLVITE) tablet 1 mg  1 mg Oral Daily Hongalgi, Anand D, MD   1 mg  at 07/02/19 1827  . haloperidol lactate (HALDOL) injection 2 mg  2 mg Intravenous Q5 Min x 2 PRN Caren Griffins, MD   2 mg at 07/03/19 0851  . multivitamin with minerals tablet 1 tablet  1 tablet Oral Daily Modena Jansky, MD   1 tablet at 07/02/19 1826  . omega-3 acid ethyl esters (LOVAZA) capsule 1,000 mg  1,000 mg Oral Daily Modena Jansky, MD   1,000 mg at 07/02/19 1825  . pregabalin (LYRICA) capsule 25 mg  25 mg Oral QHS Modena Jansky, MD   25 mg at 07/03/19 2210  . sodium chloride flush (NS) 0.9 % injection 3 mL  3 mL Intravenous Q12H Furth, Cadence H, PA-C   3 mL at 07/04/19 0927  . sodium chloride flush (NS) 0.9 % injection 3 mL  3 mL Intravenous Q12H Shelva Majestic A, MD      . sodium chloride flush (NS) 0.9 % injection 3 mL  3 mL Intravenous PRN Troy Sine, MD      . ticagrelor [facility]) tablet 90 mg  90 mg Oral BID Marcie Mowers, MD   90 mg at 07/04/19 0429  . umeclidinium-vilanterol (ANORO ELLIPTA) 62.5-25 MCG/INH 2 puff  2 puff Inhalation QHS Modena Jansky, MD   2 puff at 07/04/19 0818  . valACYclovir (VALTREX) tablet 1,000 mg  1,000 mg Oral Daily Tu, Ching T, DO   1,000 mg at 07/04/19 0930    Allergies  Allergen Reactions  . Meloxicam Other (See Comments)  . Oxycodone Other (See Comments)  . Dilaudid [Hydromorphone Hcl] Other (See Comments)    Confusion   . Methocarbamol Other (See Comments)    Drowsiness   . Percodan [Oxycodone-Aspirin] Other (See Comments)    Confusion   . Tramadol Other (See Comments)    Drowsiness   . Vicodin [Hydrocodone-Acetaminophen] Other (See Comments)    Confusion       Review of Systems:   Per HPI - remainder not obtainable and/or contributory    Physical Exam:   BP 92/61 (BP Location: Left Arm)   Pulse 82   Temp 98.1 F (36.7 C) (Oral)   Resp (!) 27   Ht _0  (1.905 m)   Wt 85.3 kg   SpO2 100%   BMI 23.50 kg/m   General:  Elderly and frail-appearing  HEENT:  Unremarkable   Neck:   no  JVD, no bruits, no adenopathy   Chest:   clear to auscultation, symmetrical breath sounds, no wheezes, no rhonchi   CV:   RRR, grade II-III systolic murmur   Abdomen:  soft, non-tender, no masses   Extremities:  warm, well-perfused, pulses diminished, no lower extremity edema  Rectal/GU  Deferred  Neuro:   Grossly non-focal and symmetrical throughout  Skin:   Clean and dry, no rashes, no breakdown  Diagnostic Tests:  Lab Results: Recent Labs    07/03/19 0438 07/04/19 0427  WBC 11.3* 9.9  HGB 11.6* 10.2*  HCT 35.3* 30.3*  PLT 168 157   BMET:  Recent Labs    07/03/19 0438 07/04/19 0427  NA 140 142  K 3.5 3.4*  CL 109 115*  CO2 22 21*  GLUCOSE 133* 127*  BUN 13 17  CREATININE 1.40* 1.68*  CALCIUM 8.9 8.4*     Results for DAMONEY, JULIA (MRN [id number]) as of 07/04/2019 12:57  Ref. Range 07/01/2019 19:05 07/01/2019 20:40 07/01/2019 22:40  CK Total Latest Ref Range: 49 - 397 U/L  2,554 (H)   Troponin I (High Sensitivity) Latest Ref Range: <18 ng/L >27,000 (HH) >27,000 (HH) >27,000 (HH)    EKG: NSR w/ RBBB, old anterior MI    CBG (last 3)  No results for input(s): GLUCAP in the last 72 hours. PT/INR:  No results for input(s): LABPROT, INR in the last 72 hours.  CXR:  PORTABLE CHEST 1 VIEW  COMPARISON:  07/01/2019  FINDINGS: Cardiac shadow is stable. Pacing device is again seen. Aortic calcifications are noted. Lungs are well aerated bilaterally. Slight increase in atelectatic changes are noted on the right. No sizable effusion is seen.  IMPRESSION: Slight increase in right basilar atelectasis when compared with the prior exam.   Electronically Signed   By: Inez Catalina M.D.   On: 07/04/2019 09:20     ECHOCARDIOGRAM REPORT    Patient Name:  NENG ALBEE [facility] Date of Exam: 07/02/2019  Medical Rec #: [id number]   Height:    75.0 in  Accession #:  [id number]   Weight:    171.7 lb  Date of Birth: 09-01-1931   BSA:     2.057 m    Patient Age:  41 years    BP:      137/80 mmHg  Patient Gender: M       HR:      71 bpm.  Exam Location: Inpatient   Procedure: 2D Echo and Intracardiac Opacification Agent   Indications:  Acute myocardial infarction 410    History:    Patient has prior history of Echocardiogram examinations,  most         recent 09/15/2017. Stroke; Risk Factors:Hypertension and         Dyslipidemia. AAA. Past history of cancer.    Sonographer:  Darlina Sicilian RDCS  Referring Phys: [phone] ADAM S BARNETT   IMPRESSIONS    1. Left ventricular ejection fraction, by estimation, is 40 to 45%. The  left ventricle has mildly decreased function. The left ventricle  demonstrates regional wall motion abnormalities (see scoring  diagram/findings for description). There is mild left  ventricular hypertrophy. Left ventricular diastolic parameters are  consistent with Grade I diastolic dysfunction (impaired relaxation). There  is severe akinesis of the left ventricular, entire inferior wall,  inferolateral wall and lateral wall,  suggestive of LCx territory infarct.  2. Right ventricular systolic function is mildly reduced. The right  ventricular size is normal. There is normal pulmonary artery systolic  pressure.  3. The mitral valve is grossly normal. Trivial mitral valve  regurgitation.  4. The aortic valve is tricuspid. Aortic valve regurgitation is not  visualized.   Comparison(s): Changes from prior study are noted. 09/15/2017: LVEF 55-60%.   FINDINGS  Left Ventricle: Left ventricular ejection fraction, by estimation, is 40  to 45%. The left ventricle has mildly decreased function. The left  ventricle demonstrates regional wall motion abnormalities. Severe akinesis  of the left ventricular, entire  inferior wall, inferolateral wall and lateral wall. Definity contrast  agent was given IV to delineate  the left ventricular endocardial borders.   The left ventricular internal cavity size was normal in size. There is  mild left ventricular hypertrophy. Left  ventricular diastolic parameters are consistent with Grade I diastolic  dysfunction (impaired relaxation). Indeterminate filling pressures.   Right Ventricle: The right ventricular size is normal. No increase in  right ventricular wall thickness. Right ventricular systolic function is  mildly reduced. There is normal pulmonary artery systolic pressure. The  tricuspid regurgitant velocity is 1.67  m/s, and with an assumed right atrial pressure of 8 mmHg, the estimated  right ventricular systolic pressure is 09.6 mmHg.   Left Atrium: Left atrial size was normal in size.   Right Atrium: Right atrial size was normal in size.   Pericardium: There is no evidence of pericardial effusion.   Mitral Valve: The mitral valve is grossly normal. Trivial mitral valve  regurgitation.   Tricuspid Valve: The tricuspid valve is grossly normal. Tricuspid valve  regurgitation is trivial.   Aortic Valve: The aortic valve is tricuspid. Aortic valve regurgitation is  not visualized.   Pulmonic Valve: The pulmonic valve was not well visualized. Pulmonic valve  regurgitation is not visualized.   Aorta: The aortic root and ascending aorta are structurally normal, with  no evidence of dilitation.   IAS/Shunts: No atrial level shunt detected by color flow Doppler.   Additional Comments: A pacer wire is visualized.     LEFT VENTRICLE  PLAX 2D  LVOT diam:   1.90 cm   Diastology  LV SV:     52      LV e' lateral:  6.74 cm/s  LV SV Index:  25      LV E/e' lateral: 8.3  LVOT Area:   2.84 cm   LV e' medial:  4.35 cm/s               LV E/e' medial: 12.9    LV Volumes (MOD)  LV vol d, MOD A2C: 172.0 ml  LV vol d, MOD A4C: 154.0 ml  LV vol s, MOD A2C: 91.9 ml  LV vol s, MOD A4C: 85.5 ml  LV SV MOD A2C:   80.1 ml  LV SV MOD A4C:   154.0 ml   LV SV MOD BP:   74.8 ml   RIGHT VENTRICLE  RV S prime:   9.25 cm/s  TAPSE (M-mode): 2.0 cm   LEFT ATRIUM       Index  LA Vol (A2C):  64.5 ml 31.36 ml/m  LA Vol (A4C):  50.0 ml 24.31 ml/m  LA Biplane Vol: 61.1 ml 29.71 ml/m  AORTIC VALVE  LVOT Vmax:  105.00 cm/s  LVOT Vmean: 68.200 cm/s  LVOT VTI:  0.182 m    AORTA  Ao Root diam: 3.10 cm   MITRAL VALVE        TRICUSPID VALVE  MV Area (PHT): 4.68 cm  TR Peak grad:  11.2 mmHg  MV Decel Time: 162 msec  TR Vmax:    167.00 cm/s  MV E velocity: 56.20 cm/s  MV A velocity: 74.40 cm/s SHUNTS  MV E/A ratio: 0.76    Systemic VTI: 0.18 m               Systemic Diam: 1.90 cm   Lyman Bishop MD  Electronically signed by Lyman Bishop MD  Signature Date/Time: 07/02/2019/11:44:17 AM       CORONARY STENT INTERVENTION  LEFT HEART CATH AND CORONARY ANGIOGRAPHY     Conclusion RV Branch-1 lesion  is 85% stenosed.  RV Branch-2 lesion is 90% stenosed.  Prox Cx to Mid Cx lesion is 85% stenosed.  1st Mrg lesion is 70% stenosed.  1st Diag lesion is 100% stenosed.  Post intervention, there is a 0% residual stenosis.  Lat 1st Diag lesion is 50% stenosed.  A stent was successfully placed. Multivessel coronary obstructive disease with a totally occluded diagonal 1 vessel at its ostium from the LAD; ramus intermediate vessel; 85% proximal stenosis in the left circumflex coronary artery with a small marginal branch arising superiorly from this stenosis with ostial narrowing of 70%; and large RCA with 85% ostial and 90% mid large marginal branch supplying the PDA territory.  LVEDP 18 mmHg  Successful PCI to the 100% occluded diagonal vessel with PTCA and ultimate stenting with a 2.25 x 22 mm Resolute Onyx DES stent doing and opening up a moderate size bifurcating diagonal vessel. There is mild 50% ostial narrowing in the inferior branch of the diagonal vessel following its bifurcation.   RECOMMENDATION:  DAPT for minimum of 1 year. The patient had troponin greater than 27,000 consistent with his diagonal occlusion which corresponds to the mild anterolateral ST elevation noted initially. We will hydrate the patient well overnight. If renal function and patient is stable consider staged PCI to the circumflex and RCA tomorrow afternoon.      Recommendations Antiplatelet/Anticoag Recommend uninterrupted dual antiplatelet therapy with Aspirin 1m daily and Ticagrelor 912mtwice daily for a minimum of 12 months (ACS-Class I recommendation).        Indications Non-ST elevation (NSTEMI) myocardial infarction ([place][I21.4 (ICD-10-CM)]     Procedural Details Technical Details Mr. FrCalloway Andruss an [age]ear old gentleman has a history of AAA, sinus node dysfunction s/p PPM, neurodegenerative disorder of unclear etiology (dementia, gait imbalance, tremor, neuropathy), orthostatic hypotension, prostate cancer, TIAs initially presented with weakness. On ECGs has shown right bundle branch block with mild ST elevation in V4 through V6. Subsequent troponins have been greater than 27,000. He is now referred for authorization after undergoing MRI of his brain today which did not reveal any stroke or bleed.  The patient was brought to the cardiac catheterization lab in the fasting state. The patient was not premedicated but had recently received Haldol during his MRI examination. The right radial artery was punctured via the Seldinger technique, and a 6 [place] was inserted without difficulty. A radial cocktail consisting of Verapamil 3 mg was administered. The patient received weight adjusted heparin. A safety J wire was advanced into the ascending aorta. Diagnostic catheterization was done with a 5 French TIG 4.0 catheter, JR4 and JL 3.5 catheters. Left ventricular pressure was recorded but left ventriculography was not done since his echo had shown EF of 40 to 45%. With the  demonstration of total occlusion of the diagonal vessel immediately after its takeoff from the proximal LAD the decision was made to initially perform culprit vessel PCI with plans for staged intervention to the circumflex and RCA. Patient received additional heparin during the entire procedure received a total dose of 12,000 units inclusive of the diagnostic portion of the test. An 80 mg was given orally. A 6 French XB LAD 3.5 guide was inserted. A Choice PT moderate support wire was advanced down the proximal LAD and was able to cross the total the occluded ostial diagonal vessel. The wire was advanced and seen to enter into a superior branch in the midportion of the vessel when it had bifurcated. Ischial PTCA was performed  with a 2.0 x 12 mm balloon with multiple inflations ostially extending to the point of bifurcation. A 2.25 x 22 mm Resolute Onyx DES stent was then carefully inserted and positioned with the distal aspect just proximal to the Y of the bifurcation vessels small vessel at the ostium of the diagonal vessel. Stent was deployed at 11 and 12 atm. A 2.25 x 15 mm noncompliant balloon was used for post stent dilatation. Angiography confirmed an excellent result with brisk TIMI-3 flow down the diagonal vessel which bifurcated into a superior and inferior branch. There was mild 50% narrowing at the ostium of the inferior branch which was not intervened upon. A TR radial band was applied for hemostasis. The patient left the catheterization laboratory in stable condition.   Estimated blood loss <50 mL.   During this procedure medications were administered to achieve and maintain moderate conscious sedation while the patient's heart rate, blood pressure, and oxygen saturation were continuously monitored and I was present face-to-face 100% of this time.     Medications (Filter: Administrations occurring from 07/03/19 1430 to 07/03/19 1703)  Continuous medications are totaled by the amount  administered until 07/03/19 1703.  lidocaine (PF) (XYLOCAINE) 1 % injection (mL) Total volume: 2 mL  Date/Time   Rate/Dose/Volume Action  07/03/19 1505  2 mL Given  Radial Cocktail/Verapamil only (mL) Total volume: 10 mL  Date/Time   Rate/Dose/Volume Action  07/03/19 1509  10 mL Given  Heparin (Porcine) in NaCl 1000-0.9 UT/500ML-% SOLN (mL) Total volume: 1,000 mL  Date/Time   Rate/Dose/Volume Action  07/03/19 1512  500 mL Given  1512  500 mL Given  heparin sodium (porcine) injection (Units) Total dose: 12,000 Units  Date/Time   Rate/Dose/Volume Action  07/03/19 1517  4,000 Units Given  1537  5,000 Units Given  1551  3,000 Units Given  0.9 % sodium chloride infusion (mL/hr) Total dose: Cannot be calculated* Dosing weight: 79.8  *Continuous medication not stopped within the calculation time range.  Date/Time   Rate/Dose/Volume Action  07/03/19 1539  100 mL/hr New Bag/Given  1628  120 mL/hr Rate/Dose Change  ticagrelor (BRILINTA) tablet (mg) Total dose: 180 mg  Date/Time   Rate/Dose/Volume Action  07/03/19 1543  180 mg Given  nitroGLYCERIN 1 mg/10 mL (100 mcg/mL) - IR/CATH LAB (mcg) Total dose: 1,000 mcg  Date/Time   Rate/Dose/Volume Action  07/03/19 1553  200 mcg Given  1610  200 mcg Given  1615  200 mcg Given  1620  200 mcg Given  1630  200 mcg Given  iohexol (OMNIPAQUE) 350 MG/ML injection (mL) Total volume: 235 mL  Date/Time   Rate/Dose/Volume Action  07/03/19 1635  235 mL Given  acetaminophen (TYLENOL) tablet 650 mg (mg) Total dose: Cannot be calculated* Dosing weight: 74.4  *Administration dose not documented  Date/Time   Rate/Dose/Volume Action  07/03/19 1448  *Not included in total MAR Hold  albuterol (PROVENTIL) (2.5 MG/3ML) 0.083% nebulizer solution 2.5 mg (mg) Total dose: Cannot be calculated* Dosing weight: 77.9  *Administration dose not documented  Date/Time   Rate/Dose/Volume Action  07/03/19 1449  *Not included in total MAR Hold  ascorbic acid  (VITAMIN C) tablet 1,000 mg (mg) Total dose: Cannot be calculated* Dosing weight: 77.9  *Administration dose not documented  Date/Time   Rate/Dose/Volume Action  07/03/19 1448  *Not included in total MAR Hold  atorvastatin (LIPITOR) tablet 40 mg (mg) Total dose: Cannot be calculated* Dosing weight: 77.9  *Administration dose not documented  Date/Time   Rate/Dose/Volume Action  07/03/19 1448  *Not included in total MAR Hold  carbidopa-levodopa (SINEMET IR) 25-100 MG per tablet immediate release 1 tablet (tablet) Total dose: Cannot be calculated*  *Administration dose not documented  Date/Time   Rate/Dose/Volume Action  07/03/19 1448  *Not included in total MAR Hold  cholecalciferol (VITAMIN D3) tablet 5,000 Units (Units) Total dose: Cannot be calculated*  *Administration dose not documented  Date/Time   Rate/Dose/Volume Action  07/03/19 1448  *Not included in total MAR Hold  folic acid (FOLVITE) tablet 1 mg (mg) Total dose: Cannot be calculated* Dosing weight: 77.9  *Administration dose not documented  Date/Time   Rate/Dose/Volume Action  07/03/19 1448  *Not included in total MAR Hold  haloperidol lactate (HALDOL) injection 2 mg (mg) Total dose: Cannot be calculated* Dosing weight: 79.8  *Administration dose not documented  Date/Time   Rate/Dose/Volume Action  07/03/19 1449  *Not included in total MAR Hold  multivitamin with minerals tablet 1 tablet (tablet) Total dose: Cannot be calculated* Dosing weight: 77.9  *Administration dose not documented  Date/Time   Rate/Dose/Volume Action  07/03/19 1448  *Not included in total MAR Hold  omega-3 acid ethyl esters (LOVAZA) capsule 1,000 mg (mg) Total dose: Cannot be calculated*  *Administration dose not documented  Date/Time   Rate/Dose/Volume Action  07/03/19 1448  *Not included in total MAR Hold  pregabalin (LYRICA) capsule 25 mg (mg) Total dose: Cannot be calculated*  *Administration dose not documented  Date/Time    Rate/Dose/Volume Action  07/03/19 1448  *Not included in total MAR Hold  sodium chloride flush (NS) 0.9 % injection 3 mL (mL) Total dose: Cannot be calculated* Dosing weight: 77.9  *Administration dose not documented  Date/Time   Rate/Dose/Volume Action  07/03/19 1448  *Not included in total MAR Hold  umeclidinium-vilanterol (ANORO ELLIPTA) 62.5-25 MCG/INH 2 puff (puff) Total dose: Cannot be calculated*  *Administration dose not documented  Date/Time   Rate/Dose/Volume Action  07/03/19 1449  *Not included in total MAR Hold  valACYclovir (VALTREX) tablet 1,000 mg (mg) Total dose: Cannot be calculated* Dosing weight: 74.4  *Administration dose not documented  Date/Time   Rate/Dose/Volume Action  07/03/19 1448  *Not included in total MAR Hold  aspirin EC tablet 81 mg (mg) Total dose: Cannot be calculated* Dosing weight: 77.9  *Administration dose not documented  Date/Time   Rate/Dose/Volume Action  07/03/19 1448  *Not included in total MAR Hold  heparin ADULT infusion 100 units/mL (25000 units/262m sodium chloride 0.45%) (Units/hr) Total dose: Cannot be calculated* Dosing weight: 74.4  *Administration dose not documented  Date/Time   Rate/Dose/Volume Action  07/03/19 1700  217.77 [vol]   metoprolol tartrate (LOPRESSOR) tablet 12.5 mg (mg) Total dose: Cannot be calculated* Dosing weight: 77.9  *Administration dose not documented  Date/Time   Rate/Dose/Volume Action  07/03/19 1448  *Not included in total MAR Hold           Contrast Medication Name Total Dose  iohexol (OMNIPAQUE) 350 MG/ML injection 235 mL     Radiation/Fluoro Fluoro time: 18.3 (min)  DAP: 41.1 (Gycm2)  Cumulative Air Kerma: 848 (mGy)        Coronary Findings Diagnostic Dominance: Right  Left Anterior Descending   First Diagonal Branch  1st Diag lesion 100% stenosed  1st Diag lesion is 100% stenosed.   Lateral First Diagonal Branch  Lat 1st Diag lesion 50% stenosed  Lat 1st Diag lesion  is 50% stenosed.   Left Circumflex  Prox Cx to Mid Cx lesion 85% stenosed  Prox Cx to  Mid Cx lesion is 85% stenosed.   First Obtuse Marginal Branch  Vessel is small in size.  1st Mrg lesion 70% stenosed  1st Mrg lesion is 70% stenosed.   Right Coronary Artery   Right Ventricular Branch  RV Branch-1 lesion 85% stenosed  RV Branch-1 lesion is 85% stenosed.  RV Branch-2 lesion 90% stenosed  RV Branch-2 lesion is 90% stenosed.  Intervention 1st Diag lesion  Stent  A stent was successfully placed.  Post-Intervention Lesion Assessment  The intervention was successful. Pre-interventional TIMI flow is 0. Post-intervention TIMI flow is 3. No complications occurred at this lesion.  There is a 0% residual stenosis post intervention.                          Left Heart Left Ventricle LVEDP 18 mmHg     Coronary Diagrams Diagnostic Dominance: Right  &&&&&  Intervention &&&&&       Implants  Permanent Stent  Stent Resolute Onyx 2.25x22 - [id number] - Implanted  Inventory item: Loma Sousa 8.25K53 Model/Cat number: [id number]  Manufacturer: [place] Lot number: [id number]  Device identifier: [id number] Device identifier type: GS1  GUDID Information   Request status Successful     Brand name: Resolute OnyxT Version/Model: [id number]   Company name: [employer] MRI safety info as of 07/03/19: MR Conditional   Contains dry or latex rubber: No     GMDN P.T. name: Drug-eluting coronary artery stent, non-bioabsorbable-polymer-coated     As of 07/03/2019    Status: Implanted          Syngo Images Link to Procedure Log  Show images for CARDIAC CATHETERIZATION Procedure Log     Images on Long Term Storage   Show images for Keric, Zehren         [facility] Data   Most Recent Value  AO Systolic Pressure 408 mmHg  AO Diastolic Pressure 65 mmHg  AO Mean 82 mmHg  LV Systolic Pressure 99 mmHg  LV Diastolic Pressure 8 mmHg  LV EDP  18 mmHg  AOp Systolic Pressure 144 mmHg  AOp Diastolic Pressure 59 mmHg  AOp Mean Pressure 77 mmHg  LVp Systolic Pressure 98 mmHg  LVp Diastolic Pressure 5 mmHg  LVp EDP Pressure 18 mmHg       Impression:  I have personally reviewed the patient's diagnostic catheterization and both echocardiograms performed July 02, 2019 and today.  Patient recently suffered likely out-of-hospital acute ST segment elevation myocardial infarction involving the anterolateral and posterior lateral wall and now has developed new systolic murmur on physical exam and what may be severe mitral regurgitation on follow-up echocardiogram.  I suspect that he may have acute ischemic mitral regurgitation, possibly due to ruptured papillary muscle.  At present he does not appear to be in shock although systolic blood pressure is borderline low.  He is breathing comfortably and chest x-ray this morning revealed only mild pulmonary vascular congestion.  I would not consider this patient a candidate for surgical intervention under any circumstances because of the patient's advanced age and significant comorbid medical problems including baseline chronic neurodegenerative disorder with significant dementia and very limited physical mobility.  Options include continued medical care without any sort of aggressive intervention versus further work-up to consider whether or not palliative transcatheter edge-to-edge repair using MitraClip might be feasible and/or beneficial.    I spent in excess of 90 minutes during the conduct of this hospital consultation and >50% of  this time involved direct face-to-face encounter for counseling and/or coordination of the patient's care.    Valentina Gu. Roxy Manns, MD 07/04/2019 12:57 PM

## 2019-07-04 NOTE — Progress Notes (Signed)
This chaplain was pastorally present as a spiritual care for  provider with PMT.  The Pt. daughter-Robin is bedside.  The chaplain listened to [name] support her desire for the Pt. to remain Full Code with the statement "Time for God to make his declaration."  The chaplain will continue F/U spiritual care with the goal of building trust with the Pt. and Pt. family.

## 2019-07-04 NOTE — Anesthesia Preprocedure Evaluation (Deleted)
Anesthesia Evaluation  Patient identified by MRN, date of birth, ID band Patient awake    Reviewed: Allergy & Precautions, NPO status , Patient's Chart, lab work & pertinent test results, reviewed documented beta blocker date and time   History of Anesthesia Complications (+) Emergence Delirium and history of anesthetic complications  Airway        Dental   Pulmonary sleep apnea , COPD,  COPD inhaler, former smoker,  Hx/o asbestos exposure          Cardiovascular hypertension, Pt. on medications and Pt. on home beta blockers + Past MI and + Cardiac Stents  + pacemaker + Valvular Problems/Murmurs MR and AI  - Systolic murmurs Non STEMI 07/02/19 now with acute MR  LVEF 40-45% Inferior and Inferolateral hypokinesis  Cardiac Cath  RV Branch-1 lesion is 85% stenosed.   RV Branch-2 lesion is 90% stenosed.   Prox Cx to Mid Cx lesion is 85% stenosed.   1st Mrg lesion is 70% stenosed.   1st Diag lesion is 100% stenosed.   Post intervention, there is a 0% residual stenosis.   Lat 1st Diag lesion is 50% stenosed.   A stent was successfully placed.   AAA   Neuro/Psych PSYCHIATRIC DISORDERS Dementia Parkinson's disease Glaucoma Shingles TIACVA, Residual Symptoms    GI/Hepatic Neg liver ROS,   Endo/Other  Hyperlipidemia   Renal/GU Renal InsufficiencyRenal diseaseRenal cyst   Hx/o Prostate Ca S/P RT and Prostatectomy ED    Musculoskeletal  (+) Arthritis , Osteoarthritis,  Lumbar Scoliosis Osteoporosis   Abdominal   Peds  Hematology  (+) anemia , Anticoagulated   Anesthesia Other Findings   Reproductive/Obstetrics                            Anesthesia Physical Anesthesia Plan  ASA: IV  Anesthesia Plan: MAC   Post-op Pain Management:    Induction: Intravenous  PONV Risk Score and Plan: 1 and Ondansetron, Propofol infusion and Treatment may vary due to age or medical  condition  Airway Management Planned: Natural Airway and Nasal Cannula  Additional Equipment:   Intra-op Plan:   Post-operative Plan:   Informed Consent: I have reviewed the patients History and Physical, chart, labs and discussed the procedure including the risks, benefits and alternatives for the proposed anesthesia with the patient or authorized representative who has indicated his/her understanding and acceptance.     Dental advisory given  Plan Discussed with: CRNA and Anesthesiologist  Anesthesia Plan Comments:         Anesthesia Quick Evaluation

## 2019-07-04 NOTE — Plan of Care (Signed)

## 2019-07-04 NOTE — Progress Notes (Signed)
Update:  Moved to ICU for acute severe MR which is likely a papillary muscle rupture. PICC placed and arterial line placed. MVo2 38% consistent with cardiogenic shock. Milrinone started at 0.25 mcg/kg/min. Respiratory status acceptable currently. Will trend Coox and continue with medical management. Will need TEE tomorrow to further evaluate. Due to acute decompensation, CT surgery was consulted. Due to recent PCI, he is not a candidate for surgery. Unclear options currently other than medical management. Family aware of plan for continued medical management and TEE in AM. He will remain full code for now. Overall, he is critically ill in the CCU with poor prognosis.   [place] T. Audie Box, [place]  [address] [place], [place] [address] [phone]  9:47 PM

## 2019-07-04 NOTE — TOC Benefit Eligibility Note (Signed)
Transition of Care [facility]) Benefit Eligibility Note    Patient Details  Name: Derek Carr MRN: [id number] Date of Birth: January 09, 1932   Medication/Dose: BRILINTA   90 MG BID  Covered?: Yes  Tier: 3 Drug  Prescription Coverage Preferred Pharmacy: [facility]  Spoke with Person/Company/Phone Number:: DEMETRIA    @  [employer] BT # [phone]  Co-Pay: $ 45.00  Prior Approval: No  Deductible: (NO DEDUCTIBLE  WITH PLAN)  Additional Notes: TICAGRELOR: NON- Jonah Blue Phone Number: 07/04/2019, 9:51 AM

## 2019-07-05 ENCOUNTER — Encounter (HOSPITAL_COMMUNITY)
Admission: EM | Disposition: A | Discharge status: Hospice | Payer: Self-pay | Source: Home / Self Care | Attending: Cardiovascular Disease

## 2019-07-05 DIAGNOSIS — Z66 Do not resuscitate: Secondary | ICD-10-CM

## 2019-07-05 DIAGNOSIS — R570 Cardiogenic shock: Secondary | ICD-10-CM

## 2019-07-05 DIAGNOSIS — F411 Generalized anxiety disorder: Secondary | ICD-10-CM

## 2019-07-05 LAB — BASIC METABOLIC PANEL
Anion gap: 11 (ref 5–15)
BUN: 31 mg/dL — ABNORMAL HIGH (ref 8–23)
CO2: 17 mmol/L — ABNORMAL LOW (ref 22–32)
Calcium: 8.2 mg/dL — ABNORMAL LOW (ref 8.9–10.3)
Chloride: 116 mmol/L — ABNORMAL HIGH (ref 98–111)
Creatinine, Ser: 2.44 mg/dL — ABNORMAL HIGH (ref 0.61–1.24)
GFR calc Af Amer: 27 mL/min — ABNORMAL LOW (ref >60)
GFR calc non Af Amer: 23 mL/min — ABNORMAL LOW (ref >60)
Glucose, Bld: 178 mg/dL — ABNORMAL HIGH (ref 70–99)
Potassium: 3.3 mmol/L — ABNORMAL LOW (ref 3.5–5.1)
Sodium: 144 mmol/L (ref 135–145)

## 2019-07-05 LAB — COOXEMETRY PANEL
Carboxyhemoglobin: 1.3 % (ref 0.5–1.5)
Methemoglobin: 1.1 % (ref 0.0–1.5)
O2 Saturation: 59.9 %
Total hemoglobin: 12.1 g/dL (ref 12.0–16.0)

## 2019-07-05 LAB — CBC
HCT: 27.5 % — ABNORMAL LOW (ref 39.0–52.0)
Hemoglobin: 9.4 g/dL — ABNORMAL LOW (ref 13.0–17.0)
MCH: 28.3 pg (ref 26.0–34.0)
MCHC: 34.2 g/dL (ref 30.0–36.0)
MCV: 82.8 fL (ref 80.0–100.0)
Platelets: 161 10*3/uL (ref 150–400)
RBC: 3.32 MIL/uL — ABNORMAL LOW (ref 4.22–5.81)
RDW: 14.6 % (ref 11.5–15.5)
WBC: 9.1 10*3/uL (ref 4.0–10.5)
nRBC: 0 % (ref 0.0–0.2)

## 2019-07-05 LAB — VITAMIN B1: Vitamin B1 (Thiamine): 187.3 nmol/L (ref 66.5–200.0)

## 2019-07-05 SURGERY — CANCELLED PROCEDURE
Anesthesia: Monitor Anesthesia Care

## 2019-07-05 MED ORDER — GLYCOPYRROLATE 0.2 MG/ML IJ SOLN
0.2000 mg | INTRAMUSCULAR | Status: DC | PRN
  Filled 2019-07-05: qty 1

## 2019-07-05 MED ORDER — SODIUM CHLORIDE 0.9 % IV SOLN
0.5000 mg/h | INTRAVENOUS | Status: DC
  Administered 2019-07-05 – 2019-07-06 (×2): 0.5 mg/h via INTRAVENOUS
  Filled 2019-07-05: qty 5

## 2019-07-05 MED ORDER — NOREPINEPHRINE 4 MG/250ML-% IV SOLN
INTRAVENOUS | Status: AC
  Filled 2019-07-05: qty 250

## 2019-07-05 MED ORDER — MORPHINE SULFATE (CONCENTRATE) 10 MG/0.5ML PO SOLN: 5.0000 mg | ORAL | Status: DC | PRN

## 2019-07-05 MED ORDER — HALOPERIDOL LACTATE 5 MG/ML IJ SOLN: 2.0000 mg | Freq: Four times a day (QID) | INTRAMUSCULAR | Status: DC | PRN

## 2019-07-05 MED ORDER — HYDROMORPHONE BOLUS VIA INFUSION
0.5000 mg | INTRAVENOUS | Status: DC | PRN
  Filled 2019-07-05: qty 1

## 2019-07-05 MED ORDER — LORAZEPAM 2 MG/ML IJ SOLN: 0.5000 mg | INTRAMUSCULAR | Status: DC | PRN

## 2019-07-05 MED ORDER — LORAZEPAM 2 MG/ML IJ SOLN: 1.0000 mg | INTRAMUSCULAR | Status: DC | PRN

## 2019-07-05 MED ORDER — ATROPINE SULFATE 1 % OP SOLN
2.0000 [drp] | Freq: Four times a day (QID) | OPHTHALMIC | Status: DC | PRN
  Filled 2019-07-05: qty 2

## 2019-07-05 MED ORDER — NOREPINEPHRINE 4 MG/250ML-% IV SOLN
0.0000 ug/min | INTRAVENOUS | Status: DC
  Administered 2019-07-05: 2 ug/min via INTRAVENOUS

## 2019-07-05 MED ORDER — GLYCOPYRROLATE 0.2 MG/ML IJ SOLN
0.2000 mg | INTRAMUSCULAR | Status: DC
  Administered 2019-07-05 – 2019-07-06 (×5): 0.2 mg via INTRAVENOUS
  Filled 2019-07-05 (×4): qty 1

## 2019-07-05 MED ORDER — SODIUM CHLORIDE 0.9 % IV SOLN
0.5000 mg/h | INTRAVENOUS | Status: DC
  Filled 2019-07-05: qty 2.5

## 2019-07-05 MED ORDER — LORAZEPAM 1 MG PO TABS: 1.0000 mg | ORAL_TABLET | ORAL | Status: DC | PRN

## 2019-07-05 MED ORDER — ACETAMINOPHEN 650 MG RE SUPP
650.0000 mg | Freq: Four times a day (QID) | RECTAL | Status: DC | PRN
  Administered 2019-07-06: 650 mg via RECTAL
  Filled 2019-07-05: qty 1

## 2019-07-05 MED ORDER — DIPHENHYDRAMINE HCL 50 MG/ML IJ SOLN
12.5000 mg | Freq: Four times a day (QID) | INTRAMUSCULAR | Status: DC | PRN
  Administered 2019-07-06: 12.5 mg via INTRAVENOUS
  Filled 2019-07-05: qty 1

## 2019-07-05 MED ORDER — HYDROMORPHONE HCL 1 MG/ML IJ SOLN
0.2500 mg | Freq: Once | INTRAMUSCULAR | Status: AC
  Administered 2019-07-05: 0.25 mg via INTRAVENOUS
  Filled 2019-07-05: qty 0.5

## 2019-07-05 MED ORDER — HYDROMORPHONE HCL 1 MG/ML IJ SOLN: 1.0000 mg | INTRAMUSCULAR | Status: DC

## 2019-07-05 MED ORDER — MORPHINE SULFATE (PF) 2 MG/ML IV SOLN
1.0000 mg | INTRAVENOUS | Status: DC | PRN
  Administered 2019-07-06: 2 mg via INTRAVENOUS
  Filled 2019-07-05: qty 1

## 2019-07-05 NOTE — Progress Notes (Signed)
This chaplain was present with Pt. and family.  The time together is celebrating the sacred stillness of the Pt. life with prayer and singing. The chaplain will continue to be present for F/U spiritual care as needed.

## 2019-07-05 NOTE — Progress Notes (Signed)
Assisted tele visit to patient with family members.  Derran Sear Anderson, RN   

## 2019-07-05 NOTE — Progress Notes (Addendum)
Daily Progress Note   Patient Name: Derek Carr       Date: 07/05/2019 DOB: 1931-05-05  Age: 86 y.o. MRN#: [id number] Attending Physician: Geralynn Rile, MD Primary Care Physician: [name], MD Admit Date: 07/01/2019   Reason for Consultation/Follow-up: Establishing goals of care  Subjective: Patient intermittently drowsy/uncomfortable and then moments of lucidity where he is talking and smiling with family.  GOC:  Chart reviewed. Patient in cardiogenic shock with worsening kidney function.   Wife and four children at bedside [name], Carolynn Serve, and [place]). They spoke with Dr. Audie Box and Dr. Ricard Dillon this AM and understand diagnoses and poor prognosis. The family is ready for transition to comfort measures only, understanding he will likely pass in the hospital. [name] is requesting for comfort gtt to be initiated as soon as possible. Discussed comfort measures, symptom management medications, visitor policy. [name] confirms decision for DNR code status now, understanding peace/dignity and a natural death.   Emotional/spiritual support provided. Chaplain notified of transition to comfort. Answered all questions.   ADDENDUM: Extensive time spent with family this morning. After transition to comfort measures, patient's children were requesting transfer home with hospice asap as their desire was for Dairon to pass away at home. All children understand risk that he could pass en route home and accepting this risk. They live [place] minutes from [facility]. Discussed in detail with RN, hospice liaison, and attending. Plan was to transfer patient home with comfort medications. Hospice RN was planning to be at the house by 1:30 for admission. Daughter is an Therapist, sports and can confidently manage comfort medications  at home.   Around 11:45am, patient with decreased responsiveness. Drowsy, dyspnea, periods of apnea. Dilaudid 0.25mg  IV x1 and Robinul 0.2mg  IV given prior to transfer home with hospice. EMS arrived to bedside for transfer. BP 74/54, oxygen 98% on room air. Continues to have short periods of apnea. Family greatly struggling with decision to transfer home with hospice (understanding risk he could pass in the ambulance). They are praying for hope and guidance on this decision. After detailed discussion and consideration, decision is made to stay inpatient for EOL care. Patient with intermittently discomfort (increased work of breathing and restlessness). Still with periods of apnea. Robin requesting comfort gtt to be started now. RN to hang Dilaudid 0.5mg  IV gtt and may  bolus via infusion for breakthrough pain/dyspnea/air hunger/tachypnea.  Emotional/spiritual support provided. Greatly appreciate care team with attempted coordination home with hospice, as this was very important to the family. They are at peace with decision to remain in the hospital and understand his time is short. Anticipate patient will pass within hours.   Length of Stay: 4  Current Medications: Scheduled Meds:  . glycopyrrolate  0.2 mg Intravenous Q4H  .  HYDROmorphone (DILAUDID) injection  1 mg Intravenous NOW  . sodium chloride flush  10-40 mL Intracatheter Q12H    Continuous Infusions: . sodium chloride    . HYDROmorphone      PRN Meds: Place/Maintain arterial line **AND** sodium chloride, acetaminophen, acetaminophen, albuterol, diphenhydrAMINE, glycopyrrolate, haloperidol lactate, HYDROmorphone, LORazepam, sodium chloride flush  Physical Exam Vitals and nursing note reviewed.  Constitutional:      Appearance: He is ill-appearing.  HENT:     Head: Normocephalic and atraumatic.  Cardiovascular:     Rate and Rhythm: Regular rhythm.     Heart sounds: Murmur heard.   Pulmonary:     Effort: Tachypnea and  accessory muscle usage present. No respiratory distress.     Comments: RN to give IV dilaudid push now. RN to start dilaudid continuous infusion.  Skin:    General: Skin is warm and dry.  Neurological:     Mental Status: He is lethargic and disoriented.             Vital Signs: BP (!) 93/52   Pulse (!) 101   Temp 98.8 F (37.1 C) (Axillary)   Resp 18   Ht 6\' 3"  (1.905 m)   Wt 85.3 kg   SpO2 99%   BMI 23.50 kg/m  SpO2: SpO2: 99 % O2 Device: O2 Device: Room Air O2 Flow Rate: O2 Flow Rate (L/min): 2 L/min  Intake/output summary:   Intake/Output Summary (Last 24 hours) at 07/05/2019 0858 Last data filed at 07/05/2019 0700 Gross per 24 hour  Intake 603.76 ml  Output 700 ml  Net -96.24 ml   LBM: Last BM Date: 07/02/19 Baseline Weight: Weight: 79.4 kg Most recent weight: Weight: 85.3 kg       Palliative Assessment/Data: PPS 10%      Patient Active Problem List   Diagnosis Date Noted  . Acute mitral regurgitation   . Murmur   . Palliative care by specialist   . DNR (do not resuscitate) discussion   . AKI (acute kidney injury) ([place]) 07/02/2019  . Elevated CK 07/02/2019  . NSTEMI (non-ST elevated myocardial infarction) ([place]) 07/01/2019  . Sinus node dysfunction ([place]) 11/23/2018  . Pacemaker 11/23/2018  . Idiopathic progressive neuropathy 07/25/2018  . Vascular parkinsonism ([place]) 05/31/2018  . Bradycardia 09/16/2017  . CKD (chronic kidney disease), stage III 09/15/2017  . Near syncope 09/14/2017  . Post-operative pain   . Dementia without behavioral disturbance ([place])   . TIA (transient ischemic attack)   . Essential hypertension   . Bimalleolar fracture of left ankle 05/03/2017  . Axonal neuropathy 03/21/2017  . Right sided weakness 03/01/2017  . Cigarette smoker 06/16/2016  . SOB (shortness of breath) 06/15/2016  . Alzheimer disease ([place]) 01/07/2014  . CSA (central sleep apnea) 10/05/2013  . Syncope 06/26/2013  . COPD GOLD II 06/25/2013  . BPH (benign  prostatic hyperplasia) 05/18/2013  . Glaucoma 05/18/2013  . S/P right TH revision 05/14/2013  . Constipation 03/26/2013  . Osteoporosis 03/26/2013  . S/P right THA, AA 03/20/2013  . Hyperlipidemia   . Erectile dysfunction   .  History of asbestos exposure   . Nocturia   . History of shingles   . Dermatitis, atopic   . OA (osteoarthritis) of hip   . Arthritis   . Frequency of urination   . Lumbar scoliosis   . Malignant neoplasm of prostate ([place]) 06/07/2011  . 86 year old gentleman with stage T3 adenocarcinoma prostate with Gleason score 4+4 and PSA of 10.3 05/11/2011    Palliative Care Assessment & Plan   Patient Profile: Per intake H&P --> Asani Mcburney Donahueis a 86 y.o.malewith AAA, SSS s/p ppm placement, neurodegenerative disorder (dementia, gait disturbance, tremor, etc), orthostatic hypotension, CKD 3, TIA who was admitted 07/01/2019 with weakness and fatigue. Initial concerns for stroke but CT head negative. Labs and echo consistent with posterior MI. S/p cardiac cath 6/8 with successful PCI to 100% occluded vessel. Potential repeat cath this admission for other areas of occlusion on initial cath, significant RCA/LCS disease. Palliative care was asked to get involved to help address goals of care.   On 6/9, patient with dyspnea, soft blood pressures, and new murmur noted on cardiology exam. Stat CXR, ECHO, and lasix ordered. Stat ECHO revealed severe mitral vegetation with tethering of posterior mitral valve leaflet with pseudoprolapse of anterior mitral valve. Pending surgical evaluation. Per Dr. Audie Box, poor prognosis and high risk for mortality. Ongoing palliative discussions for goals of care.   Assessment: NSTEMI Significant RCA/LCS disease  Acute mitral regurgitation New onset murmur AKI on CKD stage IIIa Acute metabolic encephalopathy complicated by neurocognitive decline Peripheral neuropathy COPD/tobacco use ? Sjorgren's  Recommendations/Plan:  After further  discussions with cardiology and cardiothoracic surgery, family understand diagnoses and poor prognosis. They are ready for shift to comfort measures only.  DNR/DNI, no escalation of care.   Symptom management  Initiate Dilaudid 0.5mg  continuous gtt (Will avoid morphine with ARF).   RN may bolus dilaudid via infusion 0.5mg -1mg  q31min prn pain/dypsnea/air hunger/tachypnea  Robinul 0.2mg  IV q4h scheduled  Robinul 0.2mg  IV q4h prn secretions  Haldol 2mg  IV q6h prn agitation  Ativan 1mg  IV q4h prn anxiety (daughter prefers Haldol to be given if necessary).   Benadryl 12.5mg  IV q6h prn itching  Tylenol PR prn fever  Unrestricted visitor access for EOL care.  Chaplain consult appreciated.  Cardiac monitor to comfort screen. Anticipate Mr. Huynh will pass in the hospital.    Goals of Care and Additional Recommendations:  Limitations on Scope of Treatment: Full Comfort Care  Code Status:  DNR/DNI   Code Status Orders  (From admission, onward)         Start     Ordered   07/01/19 2320  Full code  Continuous     07/01/19 2319        Code Status History    Date Active Date Inactive Code Status Order ID Comments User Context   09/14/2017 1851 09/17/2017 2019 Full Code [id number]  Jani Gravel, MD ED   05/03/2017 1736 05/09/2017 1614 Full Code [id number]  Corky Sing, PA-C Inpatient   03/01/2017 2343 03/03/2017 2123 Full Code [id number]  Damita Lack, MD ED   05/14/2013 1737 05/17/2013 1636 Full Code [id number]  Pricilla Loveless, PA-C Inpatient   03/20/2013 1529 03/23/2013 1627 Full Code [id number]  Babish, Lucille Passy, PA-C Inpatient   Advance Care Planning Activity       Prognosis:  Likely hours   Discharge Planning:  Anticipated Hospital Death  Care plan was discussed with RN, f/u GOC with multiple family members including wife and four children at  bedside. Chaplain, Dr. Audie Box  Thank you for allowing the Palliative Medicine Team to assist in the care of  this patient.   Time In: 0830- 1025- Time Out: 0905 1200 Total Time 114min Prolonged Time Billed yes      Greater than 50%  of this time was spent counseling and coordinating care related to the above assessment and plan.  Ihor Dow, DNP, FNP-C Palliative Medicine Team  Phone: [phone] Fax: [phone]  Please contact Palliative Medicine Team phone at [phone] for questions and concerns.

## 2019-07-05 NOTE — Progress Notes (Signed)
PT Cancellation Note  Patient Details Name: Derek Carr MRN: [id number] DOB: [birth year]   Cancelled Treatment:    Reason Eval/Treat Not Completed: Patient not medically ready. Will sign off.  Mabeline Caras, PT, DPT Acute Rehabilitation Services  Pager [phone] Office [place] 07/05/2019, 8:12 AM

## 2019-07-05 NOTE — Progress Notes (Signed)
This chaplain companioned with family in the process of Pt. transition to comfort measures.  The chaplain prayed with family and witnessed the legacy of the Pt. through the Pt family.  The chaplain will continue to F/U with spiritual care as needed.

## 2019-07-05 NOTE — Discharge Summary (Signed)
Discharge Summary    Patient ID: Derek Carr MRN: [id number]; DOB: 1931/07/23  Admit date: 07/01/2019 Discharge date: 07-08-19  Primary Care Provider: [name], MD  Primary Cardiologist: Cristopher Peru, MD  Primary Electrophysiologist:  None   Discharge Diagnoses    Principal Problem:   NSTEMI (non-ST elevated myocardial infarction) [facility]) Active Problems:   CSA (central sleep apnea)   Alzheimer disease ([place])   SOB (shortness of breath)   CKD (chronic kidney disease), stage III   AKI (acute kidney injury) ([place])   Elevated CK   Palliative care by specialist   Terminal care   Acute mitral regurgitation   Murmur   Cardiogenic shock ([place])   Anxiety state   DNR (do not resuscitate)    Diagnostic Studies/Procedures    Echo 07/02/19 1. Left ventricular ejection fraction, by estimation, is 40 to 45%. The  left ventricle has mildly decreased function. The left ventricle  demonstrates regional wall motion abnormalities (see scoring  diagram/findings for description). There is mild left  ventricular hypertrophy. Left ventricular diastolic parameters are  consistent with Grade I diastolic dysfunction (impaired relaxation). There  is severe akinesis of the left ventricular, entire inferior wall,  inferolateral wall and lateral wall,  suggestive of LCx territory infarct.  2. Right ventricular systolic function is mildly reduced. The right  ventricular size is normal. There is normal pulmonary artery systolic  pressure.  3. The mitral valve is grossly normal. Trivial mitral valve  regurgitation.  4. The aortic valve is tricuspid. Aortic valve regurgitation is not  visualized.   Comparison(s): Changes from prior study are noted. 09/15/2017: LVEF 55-60%.   Cardiac cath 07/03/19  RV Branch-1 lesion is 85% stenosed.  RV Branch-2 lesion is 90% stenosed.  Prox Cx to Mid Cx lesion is 85% stenosed.  1st Mrg lesion is 70% stenosed.  1st Diag lesion is 100%  stenosed.  Post intervention, there is a 0% residual stenosis.  Lat 1st Diag lesion is 50% stenosed.  A stent was successfully placed.   Multivessel coronary obstructive disease with a totally occluded diagonal 1 vessel at its ostium from the LAD; ramus intermediate vessel; 85% proximal stenosis in the left circumflex coronary artery with a small marginal branch arising superiorly from this stenosis with ostial narrowing of 70%; and large RCA with 85% ostial and 90% mid large marginal branch supplying the PDA territory.  LVEDP 18 mmHg  Successful PCI to the 100% occluded diagonal vessel with PTCA and ultimate stenting with a 2.25 x 22 mm Resolute Onyx DES stent doing and opening up a moderate size bifurcating diagonal vessel.  There is mild 50% ostial narrowing in the inferior branch of the diagonal vessel following its bifurcation.  RECOMMENDATION: DAPT for minimum of 1 year.  The patient had troponin greater than 27,000 consistent with his diagonal occlusion which corresponds to the mild anterolateral ST elevation noted initially.  We will hydrate the patient well overnight.  If renal function and patient is stable consider staged PCI to the circumflex and RCA tomorrow afternoon.  Coronary Diagrams  Diagnostic Dominance: Right  Intervention     Limited echo 07/04/19 1. Left ventricular ejection fraction, by estimation, is 40 to 45%. The  left ventricle has mildly decreased function. The left ventricle  demonstrates regional wall motion abnormalities (see scoring  diagram/findings for description).  2. Left atrial size was mildly dilated.  3. Moderate to severe mitral valve regurgitation.  4. Aortic valve regurgitation is trivial.  _____________   History of  Present Illness     Derek Carr is a 86 y.o. male with past medical history of AAA, sinus node dysfunction status post pacemaker, ongoing tobacco use, OSA on CPAP neurodegenerative disorder of unclear etiology  (dementia, gait imbalance, tremor, neuropathy), orthostatic hypotension, prostate cancer, TIAs who presented to the ER 07/01/2019 with weakness, acute on chronic mental status changes and profound weakness for the past 2 days as well as blurred bilateral vision and a fall on the morning of with worsening slurred speech and right lower extremity weakness. Denied any chest pain.   Hospital Course     Consultants: Internal medicine, palliative care, neurology  In the ED labs were remarkable for troponin greater than 27,000.  EKG showed sinus with right bundle and subtle ST elevation in 1, aVL, V4 through V6 and mild ST depression in V1 and V2.  Patient was started on heparin and admitted for possible catheterization.    Given confusion internal medicine was consulted.  Neurology was consulted for stroke work-up and mental decline.  CT head, neck negative for large vessel occlusion or other acute vascular abnormality. They felt decline was multi factorial. Brain MRI was ordered. Echo showed LVEF 40 to 45% with severe akinesis of the left ventricle, entire inferior wall, inferolateral wall and large lateral wall suggestive of LCx territory infarct.  Brain MRI showed no acute process.    Patient had left heart cath with PCI to occluded D1.  Also had significant RCA/LCX disease that required staged PCI.    The morning after the cath he was noted to have a new murmur, tachycardia and shortness of breath.  Stat echo was ordered which showed severe MR.  He did not have evidence of papillary muscle rupture on echo. Given the new finding of severe MR and shortness of breath the patient was transferred to the ICU, a PICC line was placed.    MD had a long discussion with family and eventually they chose to keep him full code. Palliative care also following and involved in end-of-life care/hospice discussion. Cardiothoracic surgery saw the patient and determined he was not a good surgical candidate possibly could  consider palliative MitraClip. In the ICU patient was in cardiogenic shock started on milrinone.  He had significant respiratory distress and AKI.    There was a long discussion with family, Dr. Audie Box and Dr. Darylene Price with thoracic surgery. They decided he is not a candidate for surgical repair given recent PCI requiring DAPT as well as underlying dementia and underlying neurologic condition that has resulted in severely reduced mobility.  Comfort measures were recommended given his high level of mortality of this condition.  Palliative care spoke with family  CODE STATUS was changed to DNR. Plan to discharge patient with hospice care.  On 6/11, Derek Carr was seen by Dr. Audie Box and all data were reviewed.  Derek Carr had Cheyne-Stokes respirations and decreased level of consciousness.  All the siblings and Mrs. Carr were present.  A family discussion was held and the decision was made to take him home with hospice care.  They accepted the risk that he might not survive the ride.  Case management and Palliative Care were involved in arranging the discharge.  He will be discharged with an IV site, and will be continued on comfort measures.   Did the patient have an acute coronary syndrome (MI, NSTEMI, STEMI, etc) this admission?:  Yes  AHA/ACC Clinical Performance & Quality Measures: 1. Aspirin prescribed? - No - Comfort care 2. ADP Receptor Inhibitor (Plavix/Clopidogrel, Brilinta/Ticagrelor or Effient/Prasugrel) prescribed (includes medically managed patients)? - No - Comfort care 3. Beta Blocker prescribed? - No - Comfort care 4. High Intensity Statin (Lipitor 40-80mg  or Crestor 20-40mg ) prescribed? - No - Comfort care 5. EF assessed during THIS hospitalization? - No - Comfort care 6. For EF <40%, was ACEI/ARB prescribed? - No - Reason:  Comfort care 7. For EF <40%, Aldosterone Antagonist (Spironolactone or Eplerenone) prescribed? - No - Reason:  Comfort  care 8. Cardiac Rehab Phase II ordered (including medically managed patients)? - No - Comfort care   _____________  Discharge Vitals Blood pressure (!) 85/49, pulse (!) 115, temperature 100.1 F (37.8 C), temperature source Axillary, resp. rate (!) 35, height 6\' 3"  (1.905 m), weight 85.3 kg, SpO2 (!) 81 %.  Filed Weights   07/02/19 0208 07/03/19 0300 07/04/19 0316  Weight: 77.9 kg 79.8 kg 85.3 kg    Labs & Radiologic Studies    CBC Recent Labs    07/04/19 0427 07/05/19 0445  WBC 9.9 9.1  HGB 10.2* 9.4*  HCT 30.3* 27.5*  MCV 85.6 82.8  PLT 157 865   Basic Metabolic Panel Recent Labs    07/04/19 0427 07/05/19 0445  NA 142 144  K 3.4* 3.3*  CL 115* 116*  CO2 21* 17*  GLUCOSE 127* 178*  BUN 17 31*  CREATININE 1.68* 2.44*  CALCIUM 8.4* 8.2*   Liver Function Tests Recent Labs    07/04/19 0427  AST 100*  ALT 56*  ALKPHOS 61  BILITOT 1.0  PROT 6.6  ALBUMIN 2.2*   No results for input(s): LIPASE, AMYLASE in the last 72 hours. High Sensitivity Troponin:   Recent Labs  Lab 07/01/19 1905 07/01/19 2040 07/01/19 2240  TROPONINIHS >27,000* >27,000* >27,000*    BNP Invalid input(s): POCBNP D-Dimer No results for input(s): DDIMER in the last 72 hours. Hemoglobin A1C No results for input(s): HGBA1C in the last 72 hours. Fasting Lipid Panel No results for input(s): CHOL, HDL, LDLCALC, TRIG, CHOLHDL, LDLDIRECT in the last 72 hours. Thyroid Function Tests No results for input(s): TSH, T4TOTAL, T3FREE, THYROIDAB in the last 72 hours.  Invalid input(s): FREET3 _____________  CT Angio Head W or Wo Contrast  Result Date: 07/01/2019 CLINICAL DATA:  Initial evaluation for acute trauma, fall, weakness, confusion. EXAM: CT ANGIOGRAPHY HEAD AND NECK TECHNIQUE: Multidetector CT imaging of the head and neck was performed using the standard protocol during bolus administration of intravenous contrast. Multiplanar CT image reconstructions and MIPs were obtained to evaluate the  vascular anatomy. Carotid stenosis measurements (when applicable) are obtained utilizing NASCET criteria, using the distal internal carotid diameter as the denominator. CONTRAST:  19mL OMNIPAQUE IOHEXOL 350 MG/ML SOLN COMPARISON:  Prior CT from 03/02/2017. FINDINGS: CT HEAD FINDINGS Brain: Generalized age-related cerebral atrophy with chronic small vessel ischemic disease, stable. No acute intracranial hemorrhage. No acute large vessel territory infarct. No mass lesion, midline shift or mass effect. No hydrocephalus or extra-axial fluid collection. Vascular: No hyperdense vessel. Calcified atherosclerosis present at skull base. Skull: Scalp soft tissues demonstrate no acute finding. Calvarium intact. Sinuses: Mild scattered mucosal thickening noted within the ethmoidal air cells. Small amount of pneumatized secretions present within the left sphenoid sinus. Small right mastoid effusion noted, of doubtful significance. Orbits: Globes and orbital soft tissues demonstrate no acute finding. Review of the MIP images confirms the above findings CTA NECK FINDINGS Aortic arch: Visualized  aortic arch of normal caliber with normal 3 vessel morphology. Moderate atherosclerotic change about the arch and origin of the great vessels without flow-limiting stenosis. Right carotid system: Right common and internal carotid arteries patent without stenosis, dissection or occlusion. Mild eccentric calcified plaque at the right bifurcation without stenosis. Left carotid system: Left common and internal carotid arteries widely patent without stenosis, dissection, or occlusion. No significant atheromatous narrowing about the left bifurcation. Vertebral arteries: Both vertebral arteries arise from the subclavian arteries. No proximal subclavian stenosis. Vertebral arteries patent within the neck without stenosis, dissection, or occlusion. Skeleton: No visible acute osseous abnormality. No discrete or worrisome osseous lesions. Moderate to  advanced degenerative spondylosis noted at C3-4 through C7-T1. Other neck: No other acute soft tissue abnormality within the neck. No mass lesion or adenopathy. Upper chest: Left-sided pacemaker/AICD noted. Dependent atelectatic changes noted within the visualized right lung. Underlying centrilobular emphysema. Review of the MIP images confirms the above findings CTA HEAD FINDINGS Anterior circulation: Petrous segments patent bilaterally. Scattered calcified atheromatous plaque throughout the cavernous/supraclinoid ICAs with associated mild to moderate multifocal narrowing (no greater than 50%). A1 segments patent bilaterally. Right A1 slightly hypoplastic. Normal anterior communicating artery complex. Anterior cerebral arteries widely patent at the proximal and mid aspects. Short-segment moderate to severe distal right ACA/pericallosal stenosis noted (series 14, image 13). M1 segments patent bilaterally. Normal MCA bifurcations. Focal moderate stenosis at the left MCA bifurcation at the origins of both superior and inferior divisions (series 14, image 19). Right MCA bifurcation within normal limits. Distal MCA branches well perfused and symmetric. Posterior circulation: Scattered atheromatous plaque within the bilateral V4 segments without high-grade stenosis, right slightly worse than left. Both picas patent. Basilar patent to its distal aspect without stenosis. Superior cerebral arteries patent bilaterally. Both PCAs primarily supplied via the basilar. Severe multifocal stenoses seen involving the left P2 segment (series 14, image 20). PCAs otherwise well perfused to their distal aspects. Venous sinuses: Grossly patent allowing for timing the contrast bolus. Anatomic variants: None significant.  No intracranial aneurysm. Review of the MIP images confirms the above findings IMPRESSION: CT HEAD IMPRESSION: 1. No acute intracranial abnormality identified. 2. Age-related cerebral atrophy with mild to moderate chronic  microvascular ischemic disease. CTA HEAD AND NECK IMPRESSION: 1. Negative CTA for large vessel occlusion or other acute vascular abnormality. 2. Multifocal moderate to severe stenoses involving the intracranial circulation as above including moderate to severe right pericallosal stenosis, moderate left MCA bifurcation stenosis, with severe multifocal left P2 stenoses. 3. Additional mild-to-moderate atheromatous change about the aortic arch and carotid siphons without high-grade stenosis. Continued wide patency of the major arterial vasculature within the neck. 4.  Emphysema (ICD10-J43.9). Electronically Signed   By: Jeannine Boga M.D.   On: 07/01/2019 23:10   DG Chest 2 View  Result Date: 07/01/2019 CLINICAL DATA:  Weakness with slip and fall. EXAM: CHEST - 2 VIEW COMPARISON:  September 17, 2017 FINDINGS: There is a dual lead AICD. A trace amount of atelectasis is noted within the bilateral lung bases. There is no evidence of a pleural effusion or pneumothorax. The heart size and mediastinal contours are within normal limits. Multilevel degenerative changes seen throughout the thoracic spine. IMPRESSION: Trace amount of bibasilar atelectasis without acute or active cardiopulmonary disease. Electronically Signed   By: Virgina Norfolk M.D.   On: 07/01/2019 19:28   CT Angio Neck W and/or Wo Contrast  Result Date: 07/01/2019 CLINICAL DATA:  Initial evaluation for acute trauma, fall, weakness, confusion. EXAM: CT  ANGIOGRAPHY HEAD AND NECK TECHNIQUE: Multidetector CT imaging of the head and neck was performed using the standard protocol during bolus administration of intravenous contrast. Multiplanar CT image reconstructions and MIPs were obtained to evaluate the vascular anatomy. Carotid stenosis measurements (when applicable) are obtained utilizing NASCET criteria, using the distal internal carotid diameter as the denominator. CONTRAST:  73mL OMNIPAQUE IOHEXOL 350 MG/ML SOLN COMPARISON:  Prior CT from  03/02/2017. FINDINGS: CT HEAD FINDINGS Brain: Generalized age-related cerebral atrophy with chronic small vessel ischemic disease, stable. No acute intracranial hemorrhage. No acute large vessel territory infarct. No mass lesion, midline shift or mass effect. No hydrocephalus or extra-axial fluid collection. Vascular: No hyperdense vessel. Calcified atherosclerosis present at skull base. Skull: Scalp soft tissues demonstrate no acute finding. Calvarium intact. Sinuses: Mild scattered mucosal thickening noted within the ethmoidal air cells. Small amount of pneumatized secretions present within the left sphenoid sinus. Small right mastoid effusion noted, of doubtful significance. Orbits: Globes and orbital soft tissues demonstrate no acute finding. Review of the MIP images confirms the above findings CTA NECK FINDINGS Aortic arch: Visualized aortic arch of normal caliber with normal 3 vessel morphology. Moderate atherosclerotic change about the arch and origin of the great vessels without flow-limiting stenosis. Right carotid system: Right common and internal carotid arteries patent without stenosis, dissection or occlusion. Mild eccentric calcified plaque at the right bifurcation without stenosis. Left carotid system: Left common and internal carotid arteries widely patent without stenosis, dissection, or occlusion. No significant atheromatous narrowing about the left bifurcation. Vertebral arteries: Both vertebral arteries arise from the subclavian arteries. No proximal subclavian stenosis. Vertebral arteries patent within the neck without stenosis, dissection, or occlusion. Skeleton: No visible acute osseous abnormality. No discrete or worrisome osseous lesions. Moderate to advanced degenerative spondylosis noted at C3-4 through C7-T1. Other neck: No other acute soft tissue abnormality within the neck. No mass lesion or adenopathy. Upper chest: Left-sided pacemaker/AICD noted. Dependent atelectatic changes noted  within the visualized right lung. Underlying centrilobular emphysema. Review of the MIP images confirms the above findings CTA HEAD FINDINGS Anterior circulation: Petrous segments patent bilaterally. Scattered calcified atheromatous plaque throughout the cavernous/supraclinoid ICAs with associated mild to moderate multifocal narrowing (no greater than 50%). A1 segments patent bilaterally. Right A1 slightly hypoplastic. Normal anterior communicating artery complex. Anterior cerebral arteries widely patent at the proximal and mid aspects. Short-segment moderate to severe distal right ACA/pericallosal stenosis noted (series 14, image 13). M1 segments patent bilaterally. Normal MCA bifurcations. Focal moderate stenosis at the left MCA bifurcation at the origins of both superior and inferior divisions (series 14, image 19). Right MCA bifurcation within normal limits. Distal MCA branches well perfused and symmetric. Posterior circulation: Scattered atheromatous plaque within the bilateral V4 segments without high-grade stenosis, right slightly worse than left. Both picas patent. Basilar patent to its distal aspect without stenosis. Superior cerebral arteries patent bilaterally. Both PCAs primarily supplied via the basilar. Severe multifocal stenoses seen involving the left P2 segment (series 14, image 20). PCAs otherwise well perfused to their distal aspects. Venous sinuses: Grossly patent allowing for timing the contrast bolus. Anatomic variants: None significant.  No intracranial aneurysm. Review of the MIP images confirms the above findings IMPRESSION: CT HEAD IMPRESSION: 1. No acute intracranial abnormality identified. 2. Age-related cerebral atrophy with mild to moderate chronic microvascular ischemic disease. CTA HEAD AND NECK IMPRESSION: 1. Negative CTA for large vessel occlusion or other acute vascular abnormality. 2. Multifocal moderate to severe stenoses involving the intracranial circulation as above including  moderate  to severe right pericallosal stenosis, moderate left MCA bifurcation stenosis, with severe multifocal left P2 stenoses. 3. Additional mild-to-moderate atheromatous change about the aortic arch and carotid siphons without high-grade stenosis. Continued wide patency of the major arterial vasculature within the neck. 4.  Emphysema (ICD10-J43.9). Electronically Signed   By: Jeannine Boga M.D.   On: 07/01/2019 23:10   MR BRAIN WO CONTRAST  Result Date: 07/03/2019 CLINICAL DATA:  Encephalopathy. EXAM: MRI HEAD WITHOUT CONTRAST TECHNIQUE: Multiplanar, multiecho pulse sequences of the brain and surrounding structures were obtained without intravenous contrast. COMPARISON:  Head CT July 01, 2019 FINDINGS: Brain: No acute infarction, hemorrhage, hydrocephalus, extra-axial collection or mass lesion. Focus of encephalomalacia in the splenium of the corpus callosum on the left with increased signal on the diffusion-weighted images at its margins, without corresponding low signal on ADC, consistent with T2 shine through. This infarct was seen on prior CT performed in July 01, 2019, but new since MRI performed in February 02, 2018. Scattered foci of T2 hyperintensity are seen within the white matter of cerebral hemispheres and within pons, nonspecific, most likely related to chronic small vessel ischemia. Prominence of the cerebral sulci reflecting mild parenchymal volume loss. Vascular: Normal flow voids. Skull and upper cervical spine: Normal marrow signal. Sinuses/Orbits: Bilateral lens surgery. Mucosal thickening of the bilateral ethmoid cells and right maxillary sinus. Other: Right mastoid effusion. IMPRESSION: 1. No evidence of acute intracranial abnormality. 2. Focus of encephalomalacia in the splenium of the corpus callosum on the left with increased signal on the diffusion-weighted images at its margins, without corresponding low signal on ADC, consistent with T2 shine through. This infarct was seen on  prior CT performed in July 01, 2019, but new since MRI performed in February 02, 2018. 3. Mild chronic small vessel ischemic changes. 4. Mild parenchymal volume loss. 5. Paranasal sinus disease as described. 6. Right mastoid effusion. Electronically Signed   By: Pedro Earls M.D.   On: 07/03/2019 11:32   CARDIAC CATHETERIZATION  Result Date: 07/03/2019  RV Branch-1 lesion is 85% stenosed.  RV Branch-2 lesion is 90% stenosed.  Prox Cx to Mid Cx lesion is 85% stenosed.  1st Mrg lesion is 70% stenosed.  1st Diag lesion is 100% stenosed.  Post intervention, there is a 0% residual stenosis.  Lat 1st Diag lesion is 50% stenosed.  A stent was successfully placed.  Multivessel coronary obstructive disease with a totally occluded diagonal 1 vessel at its ostium from the LAD; ramus intermediate vessel; 85% proximal stenosis in the left circumflex coronary artery with a small marginal branch arising superiorly from this stenosis with ostial narrowing of 70%; and large RCA with 85% ostial and 90% mid large marginal branch supplying the PDA territory. LVEDP 18 mmHg Successful PCI to the 100% occluded diagonal vessel with PTCA and ultimate stenting with a 2.25 x 22 mm Resolute Onyx DES stent doing and opening up a moderate size bifurcating diagonal vessel.  There is mild 50% ostial narrowing in the inferior branch of the diagonal vessel following its bifurcation. RECOMMENDATION: DAPT for minimum of 1 year.  The patient had troponin greater than 27,000 consistent with his diagonal occlusion which corresponds to the mild anterolateral ST elevation noted initially.  We will hydrate the patient well overnight.  If renal function and patient is stable consider staged PCI to the circumflex and RCA tomorrow afternoon.   DG CHEST PORT 1 VIEW  Result Date: 07/04/2019 CLINICAL DATA:  Shortness of breath EXAM: PORTABLE CHEST 1  VIEW COMPARISON:  07/01/2019 FINDINGS: Cardiac shadow is stable. Pacing device is again  seen. Aortic calcifications are noted. Lungs are well aerated bilaterally. Slight increase in atelectatic changes are noted on the right. No sizable effusion is seen. IMPRESSION: Slight increase in right basilar atelectasis when compared with the prior exam. Electronically Signed   By: Inez Catalina M.D.   On: 07/04/2019 09:20   ECHOCARDIOGRAM COMPLETE  Result Date: 07/02/2019    ECHOCARDIOGRAM REPORT   Patient Name:   Derek Carr [facility] Date of Exam: 07/02/2019 Medical Rec #:  [id number]      Height:       75.0 in Accession #:    [id number]     Weight:       171.7 lb Date of Birth:  December 21, 1931     BSA:          2.057 m Patient Age:    86 years       BP:           137/80 mmHg Patient Gender: M              HR:           71 bpm. Exam Location:  Inpatient Procedure: 2D Echo and Intracardiac Opacification Agent Indications:    Acute myocardial infarction 410  History:        Patient has prior history of Echocardiogram examinations, most                 recent 09/15/2017. Stroke; Risk Factors:Hypertension and                 Dyslipidemia. AAA. Past history of cancer.  Sonographer:    Darlina Sicilian RDCS Referring Phys: [id number] ADAM S BARNETT IMPRESSIONS  1. Left ventricular ejection fraction, by estimation, is 40 to 45%. The left ventricle has mildly decreased function. The left ventricle demonstrates regional wall motion abnormalities (see scoring diagram/findings for description). There is mild left ventricular hypertrophy. Left ventricular diastolic parameters are consistent with Grade I diastolic dysfunction (impaired relaxation). There is severe akinesis of the left ventricular, entire inferior wall, inferolateral wall and lateral wall, suggestive of LCx territory infarct.  2. Right ventricular systolic function is mildly reduced. The right ventricular size is normal. There is normal pulmonary artery systolic pressure.  3. The mitral valve is grossly normal. Trivial mitral valve regurgitation.  4. The aortic valve is  tricuspid. Aortic valve regurgitation is not visualized. Comparison(s): Changes from prior study are noted. 09/15/2017: LVEF 55-60%. FINDINGS  Left Ventricle: Left ventricular ejection fraction, by estimation, is 40 to 45%. The left ventricle has mildly decreased function. The left ventricle demonstrates regional wall motion abnormalities. Severe akinesis of the left ventricular, entire inferior wall, inferolateral wall and lateral wall. Definity contrast agent was given IV to delineate the left ventricular endocardial borders. The left ventricular internal cavity size was normal in size. There is mild left ventricular hypertrophy. Left  ventricular diastolic parameters are consistent with Grade I diastolic dysfunction (impaired relaxation). Indeterminate filling pressures. Right Ventricle: The right ventricular size is normal. No increase in right ventricular wall thickness. Right ventricular systolic function is mildly reduced. There is normal pulmonary artery systolic pressure. The tricuspid regurgitant velocity is 1.67 m/s, and with an assumed right atrial pressure of 8 mmHg, the estimated right ventricular systolic pressure is 87.5 mmHg. Left Atrium: Left atrial size was normal in size. Right Atrium: Right atrial size was normal in size. Pericardium: There is no evidence of pericardial  effusion. Mitral Valve: The mitral valve is grossly normal. Trivial mitral valve regurgitation. Tricuspid Valve: The tricuspid valve is grossly normal. Tricuspid valve regurgitation is trivial. Aortic Valve: The aortic valve is tricuspid. Aortic valve regurgitation is not visualized. Pulmonic Valve: The pulmonic valve was not well visualized. Pulmonic valve regurgitation is not visualized. Aorta: The aortic root and ascending aorta are structurally normal, with no evidence of dilitation. IAS/Shunts: No atrial level shunt detected by color flow Doppler. Additional Comments: A pacer wire is visualized.  LEFT VENTRICLE PLAX 2D LVOT  diam:     1.90 cm      Diastology LV SV:         52           LV e' lateral:   6.74 cm/s LV SV Index:   25           LV E/e' lateral: 8.3 LVOT Area:     2.84 cm     LV e' medial:    4.35 cm/s                             LV E/e' medial:  12.9  LV Volumes (MOD) LV vol d, MOD A2C: 172.0 ml LV vol d, MOD A4C: 154.0 ml LV vol s, MOD A2C: 91.9 ml LV vol s, MOD A4C: 85.5 ml LV SV MOD A2C:     80.1 ml LV SV MOD A4C:     154.0 ml LV SV MOD BP:      74.8 ml RIGHT VENTRICLE RV S prime:     9.25 cm/s TAPSE (M-mode): 2.0 cm LEFT ATRIUM             Index LA Vol (A2C):   64.5 ml 31.36 ml/m LA Vol (A4C):   50.0 ml 24.31 ml/m LA Biplane Vol: 61.1 ml 29.71 ml/m  AORTIC VALVE LVOT Vmax:   105.00 cm/s LVOT Vmean:  68.200 cm/s LVOT VTI:    0.182 m  AORTA Ao Root diam: 3.10 cm MITRAL VALVE               TRICUSPID VALVE MV Area (PHT): 4.68 cm    TR Peak grad:   11.2 mmHg MV Decel Time: 162 msec    TR Vmax:        167.00 cm/s MV E velocity: 56.20 cm/s MV A velocity: 74.40 cm/s  SHUNTS MV E/A ratio:  0.76        Systemic VTI:  0.18 m                            Systemic Diam: 1.90 cm Lyman Bishop MD Electronically signed by Lyman Bishop MD Signature Date/Time: 07/02/2019/11:44:17 AM    Final    ECHOCARDIOGRAM LIMITED  Result Date: 07/04/2019    ECHOCARDIOGRAM LIMITED REPORT   Patient Name:   Derek Carr [facility] Date of Exam: 07/04/2019 Medical Rec #:  [id number]      Height:       75.0 in Accession #:    [id number]     Weight:       188.0 lb Date of Birth:  [birth year]     BSA:          2.137 m Patient Age:    86 years       BP:           95/72 mmHg Patient Gender: M  HR:           81 bpm. Exam Location:  Inpatient Procedure: Limited Echo, Limited Color Doppler and Cardiac Doppler          STAT ECHO  Dr. Audie Box reviewed images. Indications:    Murmur R01.1  History:        Patient has prior history of Echocardiogram examinations, most                 recent 07/02/2019. NSTEMI and CAD, Pacemaker; Risk                  Factors:Hypertension and Dyslipidemia.  Sonographer:    Mikki Santee RDCS (AE) Referring Phys: [id number] [place]  1. Left ventricular ejection fraction, by estimation, is 40 to 45%. The left ventricle has mildly decreased function. The left ventricle demonstrates regional wall motion abnormalities (see scoring diagram/findings for description).  2. Left atrial size was mildly dilated.  3. Moderate to severe mitral valve regurgitation.  4. Aortic valve regurgitation is trivial. Conclusion(s)/Recommendation(s): Compared to prior study, the anterior mitral valve leaflet now mildly prolapses across the mitral annular plane. This results in eccentric mitral regurgitation, posteriorly directed. This is moderate to severe based on available measurements, but pulmonary vein flow reversal suggests it is likely severe. There is no complete papillary muscle rupture, but partial infarct or chordae tendinae rupture cannot be excluded. FINDINGS  Left Ventricle: Left ventricular ejection fraction, by estimation, is 40 to 45%. The left ventricle has mildly decreased function. The left ventricle demonstrates regional wall motion abnormalities.  LV Wall Scoring: The mid and distal lateral wall, posterior wall, and mid anterolateral segment are akinetic. The entire anterior wall, entire inferior wall, basal anterolateral segment, and apex are hypokinetic. The entire septum is normal. Left Atrium: Left atrial size was mildly dilated. Mitral Valve: Mild anterior mitral valve prolapse with posterior eccentric jet. Systolic flow reversal in pulmonary veins. The leaflet appears at least partially attached to the papillary muscle, cannot exclude ruptured chordae tendinae. Moderate to severe mitral valve regurgitation. MV peak gradient, 5.8 mmHg. The mean mitral valve gradient is 3.0 mmHg. Tricuspid Valve: Tricuspid valve regurgitation is mild. Aortic Valve: Aortic valve regurgitation is trivial.  LEFT VENTRICLE PLAX  2D LVOT diam:     2.00 cm  Diastology LV SV:         40       LV e' lateral: 5.18 cm/s LV SV Index:   19       LV e' medial:  5.18 cm/s LVOT Area:     3.14 cm  LEFT ATRIUM             Index LA Vol (A2C):   22.8 ml 10.67 ml/m LA Vol (A4C):   61.4 ml 28.73 ml/m LA Biplane Vol: 39.3 ml 18.39 ml/m  AORTIC VALVE LVOT Vmax:   73.90 cm/s LVOT Vmean:  43.100 cm/s LVOT VTI:    0.126 m MITRAL VALVE MV Peak grad: 5.8 mmHg       SHUNTS MV Mean grad: 3.0 mmHg       Systemic VTI:  0.13 m MV Vmax:      1.20 m/s       Systemic Diam: 2.00 cm MV Vmean:     81.3 cm/s MR Peak grad:    75.7 mmHg MR Mean grad:    46.0 mmHg MR Vmax:         435.00 cm/s MR Vmean:  322.0 cm/s MR PISA:         3.08 cm MR PISA Eff ROA: 26 mm MR PISA Radius:  0.70 cm Buford Dresser MD Electronically signed by Buford Dresser MD Signature Date/Time: 07/04/2019/1:40:22 PM    Final    [place] EKG SITE RITE  Result Date: 07/04/2019 If Site Rite image not attached, placement could not be confirmed due to current cardiac rhythm.  Disposition   Pt is being discharged home today, with Hospice.  Follow-up Plans & Appointments    None Discharge Instructions    Diet - low sodium heart healthy   Complete by: As directed    Discharge wound care:   Complete by: As directed    See above   Increase activity slowly   Complete by: As directed    No wound care   Complete by: As directed       Discharge Medications   Allergies as of August 04, 2019      Reactions   Meloxicam Other (See Comments)   Oxycodone Other (See Comments)   Dilaudid [hydromorphone Hcl] Other (See Comments)   Confusion   Methocarbamol Other (See Comments)   Drowsiness   Percodan [oxycodone-aspirin] Other (See Comments)   Confusion   Tramadol Other (See Comments)   Drowsiness   Vicodin [hydrocodone-acetaminophen] Other (See Comments)   Confusion      Medication List    STOP taking these medications   acetaminophen 325 MG tablet Commonly known as:  TYLENOL Replaced by: acetaminophen 650 MG suppository   aspirin EC 81 MG tablet   b complex vitamins tablet   carbidopa-levodopa 25-100 MG tablet Commonly known as: SINEMET IR   clobetasol cream 0.05 % Commonly known as: TEMOVATE   [id number] PO   folic acid 1 MG tablet Commonly known as: FOLVITE   hydroxychloroquine 200 MG tablet Commonly known as: PLAQUENIL   lidocaine-prilocaine cream Commonly known as: EMLA   meclizine 12.5 MG tablet Commonly known as: ANTIVERT   NONFORMULARY OR COMPOUNDED ITEM   Omega 3 1000 MG Caps   pregabalin 25 MG capsule Commonly known as: LYRICA   PROBIOTIC PO   TUMS PO   umeclidinium-vilanterol 62.5-25 MCG/INH Aepb Commonly known as: Anoro Ellipta   UNABLE TO FIND   valACYclovir 1000 MG tablet Commonly known as: VALTREX   vitamin C 1000 MG tablet   Vitamin D-3 125 MCG (5000 UT) Tabs   zinc gluconate 50 MG tablet     TAKE these medications   acetaminophen 650 MG suppository Commonly known as: TYLENOL Place 1 suppository (650 mg total) rectally every 6 (six) hours as needed for fever. Replaces: acetaminophen 325 MG tablet   atropine 1 % ophthalmic solution Place 2 drops under the tongue 4 (four) times daily as needed (secretions).   bisacodyl 10 MG suppository Commonly known as: DULCOLAX Place 1 suppository (10 mg total) rectally as needed for moderate constipation.   LORazepam 2 MG/ML concentrated solution Commonly known as: ATIVAN Take 0.3-0.5 mLs (0.6-1 mg total) by mouth every 8 (eight) hours.   morphine CONCENTRATE 10 MG/0.5ML Soln concentrated solution Place 0.25-0.5 mLs (5-10 mg total) under the tongue every 2 (two) hours as needed for moderate pain or shortness of breath (air hunger/tachypnea).   ondansetron 4 MG disintegrating tablet Commonly known as: Zofran ODT Take 1 tablet (4 mg total) by mouth every 8 (eight) hours as needed for nausea or vomiting.            Discharge Care Instructions  (From  admission,  onward)         Start     Ordered   08-01-19 0000  Discharge wound care:       Comments: See above   08-01-2019 0921             Outstanding Labs/Studies   None  Duration of Discharge Encounter   Greater than 30 minutes including physician time.  Signed, Rosaria Ferries, PA-C 08-01-2019, 9:22 AM

## 2019-07-05 NOTE — Progress Notes (Signed)
SLP Cancellation Note  Patient Details Name: Derek Carr MRN: [id number] DOB: [birth year]   Cancelled treatment:       Reason Eval/Treat Not Completed: Medical issues which prohibited therapy (Pt has been transferred to [facility] from [place]. Case discussed with RN and she indicated that the pt is not currently medically stable and not appropriate for a swallow evaluation. SLP will sign off at this time. Please re-consult if clinically indicated.)  [name] I. Hardin Negus, [place], [place] Office number [phone] Pager [place] 07/05/2019, 8:33 AM

## 2019-07-05 NOTE — Progress Notes (Signed)
Cardiology Progress Note  Patient ID: [facility] LIPTON MRN: [id number] DOB: 28-Sep-1931 Date of Encounter: 07/05/2019  Primary Cardiologist: Cristopher Peru, MD  Subjective   Chief Complaint: Shortness of breath  HPI: Transferred to ICU for acute mitral rotation likely papillary muscle rupture.  Noted to be in cardiogenic shock with MVO2 38.  Started on milrinone overnight.  Notably tachypneic and confused this morning.  Airway protection is concerned.    ROS:  All other ROS reviewed and negative. Pertinent positives noted in the HPI.     Inpatient Medications  Scheduled Meds: . vitamin C  1,000 mg Oral Daily  . aspirin EC  81 mg Oral Daily  . atorvastatin  40 mg Oral Daily  . carbidopa-levodopa  1 tablet Oral Daily  . Chlorhexidine Gluconate Cloth  6 each Topical Daily  . cholecalciferol  5,000 Units Oral Daily  . folic acid  1 mg Oral Daily  . multivitamin with minerals  1 tablet Oral Daily  . omega-3 acid ethyl esters  1,000 mg Oral Daily  . pregabalin  25 mg Oral QHS  . sodium chloride flush  10-40 mL Intracatheter Q12H  . sodium chloride flush  3 mL Intravenous Q12H  . sodium chloride flush  3 mL Intravenous Q12H  . ticagrelor  90 mg Oral BID  . umeclidinium-vilanterol  2 puff Inhalation QHS  . valACYclovir  1,000 mg Oral Daily   Continuous Infusions: . sodium chloride Stopped (07/04/19 0616)  . sodium chloride    . sodium chloride Stopped (07/04/19 0802)  . sodium chloride    . milrinone 0.25 mcg/kg/min (07/05/19 0700)  . norepinephrine (LEVOPHED) Adult infusion 2 mcg/min (07/05/19 0700)   PRN Meds: sodium chloride, Place/Maintain arterial line **AND** sodium chloride, acetaminophen, albuterol, sodium chloride flush, sodium chloride flush   Vital Signs   Vitals:   07/05/19 0300 07/05/19 0400 07/05/19 0500 07/05/19 0600  BP: 108/61 (!) 98/55 98/61 (!) 93/52  Pulse: (!) 112 (!) 111 (!) 107 (!) 101  Resp: 19 (!) 32 15 18  Temp:      TempSrc:      SpO2: 96% 98% 97%  99%  Weight:      Height:        Intake/Output Summary (Last 24 hours) at 07/05/2019 0741 Last data filed at 07/05/2019 0700 Gross per 24 hour  Intake 603.76 ml  Output 700 ml  Net -96.24 ml   Last 3 Weights 07/04/2019 07/03/2019 07/02/2019  Weight (lbs) 188 lb 176 lb 171 lb 11.8 oz  Weight (kg) 85.276 kg 79.833 kg 77.9 kg      Telemetry  Overnight telemetry shows sinus tachycardia, heart rate 120, which I personally reviewed.   ECG  The most recent ECG shows normal sinus rhythm, heart rate 93, right bundle branch block, left anterior fascicular block, ST elevation anterolateral leads, which I personally reviewed.   Physical Exam   Vitals:   07/05/19 0300 07/05/19 0400 07/05/19 0500 07/05/19 0600  BP: 108/61 (!) 98/55 98/61 (!) 93/52  Pulse: (!) 112 (!) 111 (!) 107 (!) 101  Resp: 19 (!) 32 15 18  Temp:      TempSrc:      SpO2: 96% 98% 97% 99%  Weight:      Height:         Intake/Output Summary (Last 24 hours) at 07/05/2019 0741 Last data filed at 07/05/2019 0700 Gross per 24 hour  Intake 603.76 ml  Output 700 ml  Net -96.24 ml  Last 3 Weights 07/04/2019 07/03/2019 07/02/2019  Weight (lbs) 188 lb 176 lb 171 lb 11.8 oz  Weight (kg) 85.276 kg 79.833 kg 77.9 kg    Body mass index is 23.5 kg/m.    General: Ill-appearing, tachypnea noted Head: Atraumatic, normal size  Eyes: PEERLA, EOMI  Neck: Supple, no JVD Endocrine: No thryomegaly Cardiac: Tachycardia, loud holosystolic murmur Lungs: Rales noted bilaterally Abd: Soft, nontender, no hepatomegaly  Ext: Cool extremities Musculoskeletal: No deformities, BUE and BLE strength normal and equal Skin: Warm and dry, no rashes   Neuro: Alert and oriented to person only  Labs  High Sensitivity Troponin:   Recent Labs  Lab 07/01/19 1905 07/01/19 2040 07/01/19 2240  TROPONINIHS >27,000* >27,000* >27,000*     Cardiac EnzymesNo results for input(s): TROPONINI in the last 168 hours. No results for input(s): TROPIPOC in the last  168 hours.  Chemistry Recent Labs  Lab 07/03/19 0438 07/04/19 0427 07/05/19 0445  NA 140 142 144  K 3.5 3.4* 3.3*  CL 109 115* 116*  CO2 22 21* 17*  GLUCOSE 133* 127* 178*  BUN 13 17 31*  CREATININE 1.40* 1.68* 2.44*  CALCIUM 8.9 8.4* 8.2*  PROT 7.5 6.6  --   ALBUMIN 2.7* 2.2*  --   AST 173* 100*  --   ALT 76* 56*  --   ALKPHOS 74 61  --   BILITOT 0.7 1.0  --   GFRNONAA 45* 36* 23*  GFRAA 53* 42* 27*  ANIONGAP 9 6 11     Hematology Recent Labs  Lab 07/03/19 0438 07/04/19 0427 07/05/19 0445  WBC 11.3* 9.9 9.1  RBC 4.08* 3.54* 3.32*  HGB 11.6* 10.2* 9.4*  HCT 35.3* 30.3* 27.5*  MCV 86.5 85.6 82.8  MCH 28.4 28.8 28.3  MCHC 32.9 33.7 34.2  RDW 15.2 14.8 14.6  PLT 168 157 161   BNPNo results for input(s): BNP, PROBNP in the last 168 hours.  DDimer No results for input(s): DDIMER in the last 168 hours.   Radiology  MR BRAIN WO CONTRAST  Result Date: 07/03/2019 CLINICAL DATA:  Encephalopathy. EXAM: MRI HEAD WITHOUT CONTRAST TECHNIQUE: Multiplanar, multiecho pulse sequences of the brain and surrounding structures were obtained without intravenous contrast. COMPARISON:  Head CT July 01, 2019 FINDINGS: Brain: No acute infarction, hemorrhage, hydrocephalus, extra-axial collection or mass lesion. Focus of encephalomalacia in the splenium of the corpus callosum on the left with increased signal on the diffusion-weighted images at its margins, without corresponding low signal on ADC, consistent with T2 shine through. This infarct was seen on prior CT performed in July 01, 2019, but new since MRI performed in February 02, 2018. Scattered foci of T2 hyperintensity are seen within the white matter of cerebral hemispheres and within pons, nonspecific, most likely related to chronic small vessel ischemia. Prominence of the cerebral sulci reflecting mild parenchymal volume loss. Vascular: Normal flow voids. Skull and upper cervical spine: Normal marrow signal. Sinuses/Orbits: Bilateral lens  surgery. Mucosal thickening of the bilateral ethmoid cells and right maxillary sinus. Other: Right mastoid effusion. IMPRESSION: 1. No evidence of acute intracranial abnormality. 2. Focus of encephalomalacia in the splenium of the corpus callosum on the left with increased signal on the diffusion-weighted images at its margins, without corresponding low signal on ADC, consistent with T2 shine through. This infarct was seen on prior CT performed in July 01, 2019, but new since MRI performed in February 02, 2018. 3. Mild chronic small vessel ischemic changes. 4. Mild parenchymal volume loss. 5. Paranasal  sinus disease as described. 6. Right mastoid effusion. Electronically Signed   By: Pedro Earls M.D.   On: 07/03/2019 11:32   CARDIAC CATHETERIZATION  Result Date: 07/03/2019  RV Branch-1 lesion is 85% stenosed.  RV Branch-2 lesion is 90% stenosed.  Prox Cx to Mid Cx lesion is 85% stenosed.  1st Mrg lesion is 70% stenosed.  1st Diag lesion is 100% stenosed.  Post intervention, there is a 0% residual stenosis.  Lat 1st Diag lesion is 50% stenosed.  A stent was successfully placed.  Multivessel coronary obstructive disease with a totally occluded diagonal 1 vessel at its ostium from the LAD; ramus intermediate vessel; 85% proximal stenosis in the left circumflex coronary artery with a small marginal branch arising superiorly from this stenosis with ostial narrowing of 70%; and large RCA with 85% ostial and 90% mid large marginal branch supplying the PDA territory. LVEDP 18 mmHg Successful PCI to the 100% occluded diagonal vessel with PTCA and ultimate stenting with a 2.25 x 22 mm Resolute Onyx DES stent doing and opening up a moderate size bifurcating diagonal vessel.  There is mild 50% ostial narrowing in the inferior branch of the diagonal vessel following its bifurcation. RECOMMENDATION: DAPT for minimum of 1 year.  The patient had troponin greater than 27,000 consistent with his diagonal  occlusion which corresponds to the mild anterolateral ST elevation noted initially.  We will hydrate the patient well overnight.  If renal function and patient is stable consider staged PCI to the circumflex and RCA tomorrow afternoon.   DG CHEST PORT 1 VIEW  Result Date: 07/04/2019 CLINICAL DATA:  Shortness of breath EXAM: PORTABLE CHEST 1 VIEW COMPARISON:  07/01/2019 FINDINGS: Cardiac shadow is stable. Pacing device is again seen. Aortic calcifications are noted. Lungs are well aerated bilaterally. Slight increase in atelectatic changes are noted on the right. No sizable effusion is seen. IMPRESSION: Slight increase in right basilar atelectasis when compared with the prior exam. Electronically Signed   By: Inez Catalina M.D.   On: 07/04/2019 09:20   ECHOCARDIOGRAM LIMITED  Result Date: 07/04/2019    ECHOCARDIOGRAM LIMITED REPORT   Patient Name:   NYAIR DEPAULO [facility] Date of Exam: 07/04/2019 Medical Rec #:  [id number]      Height:       75.0 in Accession #:    [id number]     Weight:       188.0 lb Date of Birth:  October 22, 1931     BSA:          2.137 m Patient Age:    44 years       BP:           95/72 mmHg Patient Gender: M              HR:           81 bpm. Exam Location:  Inpatient Procedure: Limited Echo, Limited Color Doppler and Cardiac Doppler          STAT ECHO  Dr. Audie Box reviewed images. Indications:    Murmur R01.1  History:        Patient has prior history of Echocardiogram examinations, most                 recent 07/02/2019. NSTEMI and CAD, Pacemaker; Risk                 Factors:Hypertension and Dyslipidemia.  Sonographer:    Mikki Santee RDCS (AE) Referring Phys: [id number] [place]  FURTH IMPRESSIONS  1. Left ventricular ejection fraction, by estimation, is 40 to 45%. The left ventricle has mildly decreased function. The left ventricle demonstrates regional wall motion abnormalities (see scoring diagram/findings for description).  2. Left atrial size was mildly dilated.  3. Moderate to severe mitral  valve regurgitation.  4. Aortic valve regurgitation is trivial. Conclusion(s)/Recommendation(s): Compared to prior study, the anterior mitral valve leaflet now mildly prolapses across the mitral annular plane. This results in eccentric mitral regurgitation, posteriorly directed. This is moderate to severe based on available measurements, but pulmonary vein flow reversal suggests it is likely severe. There is no complete papillary muscle rupture, but partial infarct or chordae tendinae rupture cannot be excluded. FINDINGS  Left Ventricle: Left ventricular ejection fraction, by estimation, is 40 to 45%. The left ventricle has mildly decreased function. The left ventricle demonstrates regional wall motion abnormalities.  LV Wall Scoring: The mid and distal lateral wall, posterior wall, and mid anterolateral segment are akinetic. The entire anterior wall, entire inferior wall, basal anterolateral segment, and apex are hypokinetic. The entire septum is normal. Left Atrium: Left atrial size was mildly dilated. Mitral Valve: Mild anterior mitral valve prolapse with posterior eccentric jet. Systolic flow reversal in pulmonary veins. The leaflet appears at least partially attached to the papillary muscle, cannot exclude ruptured chordae tendinae. Moderate to severe mitral valve regurgitation. MV peak gradient, 5.8 mmHg. The mean mitral valve gradient is 3.0 mmHg. Tricuspid Valve: Tricuspid valve regurgitation is mild. Aortic Valve: Aortic valve regurgitation is trivial.  LEFT VENTRICLE PLAX 2D LVOT diam:     2.00 cm  Diastology LV SV:         40       LV e' lateral: 5.18 cm/s LV SV Index:   19       LV e' medial:  5.18 cm/s LVOT Area:     3.14 cm  LEFT ATRIUM             Index LA Vol (A2C):   22.8 ml 10.67 ml/m LA Vol (A4C):   61.4 ml 28.73 ml/m LA Biplane Vol: 39.3 ml 18.39 ml/m  AORTIC VALVE LVOT Vmax:   73.90 cm/s LVOT Vmean:  43.100 cm/s LVOT VTI:    0.126 m MITRAL VALVE MV Peak grad: 5.8 mmHg       SHUNTS MV Mean  grad: 3.0 mmHg       Systemic VTI:  0.13 m MV Vmax:      1.20 m/s       Systemic Diam: 2.00 cm MV Vmean:     81.3 cm/s MR Peak grad:    75.7 mmHg MR Mean grad:    46.0 mmHg MR Vmax:         435.00 cm/s MR Vmean:        322.0 cm/s MR PISA:         3.08 cm MR PISA Eff ROA: 26 mm MR PISA Radius:  0.70 cm Buford Dresser MD Electronically signed by Buford Dresser MD Signature Date/Time: 07/04/2019/1:40:22 PM    Final    [place] EKG SITE RITE  Result Date: 07/04/2019 If Site Rite image not attached, placement could not be confirmed due to current cardiac rhythm.   Cardiac Studies  TTE 07/04/2019 1. Left ventricular ejection fraction, by estimation, is 40 to 45%. The  left ventricle has mildly decreased function. The left ventricle  demonstrates regional wall motion abnormalities (see scoring  diagram/findings for description).  2. Left atrial size was mildly dilated.  3. Moderate to severe mitral valve regurgitation.  4. Aortic valve regurgitation is trivial.   Patient Profile  Guage Efferson Donahueis a 86 y.o.malewith AAA, SSS s/p ppm placement, neurodegenerative disorder (dementia, gait disturbance, tremor, etc), orthostatic hypotension, CKD 3, TIA who was admitted 07/01/2019 with weakness and fatigue. Initial concerns for stroke but CT head negative. Labs and echo consistent with posterior MI.  Assessment & Plan   Non-STEMI/acute severe mitral vegetation likely papillary muscle rupture/cardiogenic shock/acute kidney injury/hypoxic respiratory failure -Admitted with an initial presentation of weakness confusion.  Has minimal anterolateral elevation that did not meet STEMI criteria.  -He presented to 3 days after his initial visit.  His cardiac cath was further delayed by potential stroke until he was cleared by neurology with an MRI that was delayed due to pacemaker -Taken to catheterization laboratory found to have 100% occluded diagonal branch that underwent PCI.  He has significant ostial  RCA disease as well as left circumflex disease that needed staged PCI -On 07/04/2019 in the early morning hours he developed acute shortness of breath and a new murmur.  Echocardiogram demonstrated acute severe MR which is likely a papillary muscle rupture.  He was transferred to the ICU.  A PICC line was placed arterial line was placed.  Mixed venous saturation consistent with severe cardiogenic shock at 38.  He was started on milrinone overnight.  His mixed venous now 58.  He is now in significant respiratory distress as well as acute kidney injury.  We had a long discussion with the family with myself and Dr. Halford Chessman with thoracic surgery.  He is not a candidate for surgical repair given recent PCI requiring DAPT as well as underlying dementia and underlying neurological condition that is resulted in severely reduced mobility.  We did discuss yesterday pursuing the transesophageal echocardiogram to confirm the diagnosis however given that he is in cardiogenic shock, acute renal failure, hypoxic respiratory failure, he will need to be intubated for this procedure.  I recommended against this.  I recommended transition to comfort measures.  We had a long discussion this morning.  The family will discuss this and we will circle back later this morning.  I have asked nursing staff to reach back out to palliative care as we likely will be transitioning to hospice care in the hospital. -I did discuss the mortality of this condition.  It approaches 50% first 24 hours and up to 90% within 1 week.  Given his further decline in cardiogenic shock I do not see that this is a survivable condition.  His family is updated at the bedside.  For now we will just continue supportive care with milrinone and have palliative care team reach out to family for further direction of care.  For questions or updates, please contact [place] Please consult [email or web address] for contact info under   CRITICAL CARE Performed by:  [place] T O'Neal  Total critical care time: 60 minutes. Critical care time was exclusive of separately billable procedures and treating other patients. Critical care was necessary to treat or prevent imminent or life-threatening deterioration. Critical care was time spent personally by me on the following activities: development of treatment plan with patient and/or surrogate as well as nursing, discussions with consultants, evaluation of patient's response to treatment, examination of patient, obtaining history from patient or surrogate, ordering and performing treatments and interventions, ordering and review of laboratory studies, ordering and review of radiographic studies, pulse oximetry and re-evaluation of patient's condition.    Signed,  [place] T. Audie Box, [place]  07/05/2019 7:41 AM

## 2019-07-06 DIAGNOSIS — R570 Cardiogenic shock: Secondary | ICD-10-CM | POA: Diagnosis not present

## 2019-07-06 DIAGNOSIS — N189 Chronic kidney disease, unspecified: Secondary | ICD-10-CM | POA: Diagnosis not present

## 2019-07-06 DIAGNOSIS — I4891 Unspecified atrial fibrillation: Secondary | ICD-10-CM | POA: Diagnosis not present

## 2019-07-06 DIAGNOSIS — E785 Hyperlipidemia, unspecified: Secondary | ICD-10-CM | POA: Diagnosis not present

## 2019-07-06 DIAGNOSIS — G9341 Metabolic encephalopathy: Secondary | ICD-10-CM | POA: Diagnosis not present

## 2019-07-06 DIAGNOSIS — Z66 Do not resuscitate: Secondary | ICD-10-CM

## 2019-07-06 DIAGNOSIS — I051 Rheumatic mitral insufficiency: Secondary | ICD-10-CM | POA: Diagnosis not present

## 2019-07-06 DIAGNOSIS — I214 Non-ST elevation (NSTEMI) myocardial infarction: Secondary | ICD-10-CM | POA: Diagnosis not present

## 2019-07-06 DIAGNOSIS — I2510 Atherosclerotic heart disease of native coronary artery without angina pectoris: Secondary | ICD-10-CM | POA: Diagnosis not present

## 2019-07-06 DIAGNOSIS — I1310 Hypertensive heart and chronic kidney disease without heart failure, with stage 1 through stage 4 chronic kidney disease, or unspecified chronic kidney disease: Secondary | ICD-10-CM | POA: Diagnosis not present

## 2019-07-06 SURGERY — CORONARY STENT INTERVENTION
Anesthesia: LOCAL

## 2019-07-06 MED ORDER — BISACODYL 10 MG RE SUPP: 10.0000 mg | RECTAL | 0 refills | Status: AC | PRN

## 2019-07-06 MED ORDER — ONDANSETRON 4 MG PO TBDP
4.0000 mg | ORAL_TABLET | Freq: Three times a day (TID) | ORAL | 0 refills | Status: AC | PRN
Start: 2019-07-06 — End: ?

## 2019-07-06 MED ORDER — ACETAMINOPHEN 650 MG RE SUPP: 650.0000 mg | Freq: Four times a day (QID) | RECTAL | 0 refills | Status: AC | PRN

## 2019-07-06 MED ORDER — ATROPINE SULFATE 1 % OP SOLN: 2.0000 [drp] | Freq: Four times a day (QID) | OPHTHALMIC | 12 refills | Status: AC | PRN

## 2019-07-06 MED ORDER — LIDOCAINE HCL URETHRAL/MUCOSAL 2 % EX GEL
1.0000 "application " | Freq: Once | CUTANEOUS | Status: DC
  Filled 2019-07-06: qty 11

## 2019-07-06 MED ORDER — MORPHINE SULFATE (CONCENTRATE) 10 MG/0.5ML PO SOLN: 5.0000 mg | ORAL | 0 refills | Status: AC | PRN

## 2019-07-06 MED ORDER — LORAZEPAM 2 MG/ML PO CONC: 0.6000 mg | Freq: Three times a day (TID) | ORAL | 0 refills | Status: AC

## 2019-07-09 ENCOUNTER — Telehealth: Payer: Self-pay

## 2019-07-09 NOTE — Telephone Encounter (Signed)
Patients daughter [name], [place]) called to inform that patient passed away 20-Jul-2019. My apologies and prayers offered. Per [name], she wishes to know if the device showed any arrhythmias prior to patients passing  2019/07/20. Advised [name] the last information shown is 07/03/19. Informed [name] the device will now show anything after that date unless the button was pressed or any parameters were met to create an alert. Verbalizes understanding. Advised Robin to call DC back if she has any further questions or concerns.

## 2019-07-26 NOTE — TOC Transition Note (Signed)
Transition of Care (TOC) - CM/SW Discharge Note   Patient Details  Name: Derek Carr MRN: [id number] Date of Birth: 10/21/1931  Transition of Care (TOC) CM/SW Contact:   R Stubbldfield, RN Phone Number: 07/23/2019, 10:28 AM   Clinical Narrative:    Case management met with the primary nurse this morning and  Ferolito, NP with Palliative care to coordinate the patient's discharge home.  I reached out to Jennifer Woody, RN with [facility] to support the family with needed dme, medications and hospice staff support at the home.  Palliative care and myself supported the family at the bedside to insert a foley and give medications and care as needed before discharge.  [facility] was called to transport th patient home at the family's request and the daughter will wait on the medications to transport the patient.  Will continue to follow.   Final next level of care: Home w Hospice Care Barriers to Discharge:  (Family would prefer a hospital bed and continued Dilaudid infusion for home)   Patient Goals and CMS Choice Patient states their goals for this hospitalization and ongoing recovery are:: The family would like the patient to be transported today under the care of hospice through [facility]. CMS Medicare.gov Compare Post Acute Care list provided to:: Patient Represenative (must comment) Choice offered to / list presented to : Spouse, Adult Children  Discharge Placement                       Discharge Plan and Services In-house Referral: Hospice / Palliative Care Discharge Planning Services: CM Consult Post Acute Care Choice: Durable Medical Equipment, Hospice          DME Arranged:  (Called [facility] to coordinate hospital bed, pain medication and hospice care support.) DME Agency: [facility] [place] (dme to be arranged by [facility]) Date DME Agency Contacted: 07/18/2019 Time DME Agency Contacted: 0921 Representative spoke  with at DME Agency: Spoke with Anne Marie, liason with [facility] to arrange hospital liason to call back to coordinate patient care needs prior to discharge to home   HH Agency: [facility] [place] Date HH Agency Contacted: 07/25/2019 Time HH Agency Contacted: 0922 Representative spoke with at HH Agency: Anne Marie, CM with [facility] [place] [place]  Social Determinants of Health (SDOH) Interventions     Readmission Risk Interventions Readmission Risk Prevention Plan 07/03/2019  Transportation Screening Complete  PCP or Specialist Appt within 5-7 Days Complete  Home Care Screening Complete  Medication Review (RN CM) Complete  Some recent data might be hidden      

## 2019-07-26 NOTE — Progress Notes (Signed)
80 cc dilaudid wasted in stericycle. Berniece Salines, RN witness to waste.

## 2019-07-26 NOTE — Progress Notes (Signed)
Daily Progress Note   Patient Name: Derek Carr       Date: 07/28/19 DOB: 1931-10-15  Age: 86 y.o. MRN#: [id number] Attending Physician: No att. providers found Primary Care Physician: [name], MD Admit Date: 07/01/2019   Reason for Consultation/Follow-up: Establishing goals of care  Today's Discussion (Jul 28, 2019): I met with patients family at bedside with CM, Sharyn Lull. The family vocalized wanting to get [place] home as soon as able. We discussed the various obstacles at hand inclusive of the fact that Raffi is on a dilaudid gtt.  We discussed transition to SL iterations in an attempt to get [facility] home this morning as he appears to have very limited time left judging from his current respiratory state. Family agreeable with plan. Foley placed by nursing staff and medications reviewed.  Length of Stay: 5  Current Medications: Scheduled Meds:  . glycopyrrolate  0.2 mg Intravenous Q4H  . lidocaine  1 application Urethral Once  . sodium chloride flush  10-40 mL Intracatheter Q12H    Continuous Infusions: . sodium chloride    . HYDROmorphone 0.5 mg/hr (07-28-19 1100)    PRN Meds: Place/Maintain arterial line **AND** sodium chloride, acetaminophen, acetaminophen, albuterol, atropine, diphenhydrAMINE, glycopyrrolate, haloperidol lactate, HYDROmorphone, LORazepam, LORazepam, morphine injection, morphine CONCENTRATE, sodium chloride flush  Physical Exam Vitals and nursing note reviewed.  Constitutional:      Appearance: He is ill-appearing.  HENT:     Head: Normocephalic and atraumatic.  Cardiovascular:     Rate and Rhythm: Regular rhythm.     Heart sounds: Murmur heard.   Pulmonary:     Effort: Tachypnea and accessory muscle usage present. No respiratory distress.     Comments:  RN to give IV dilaudid push now. RN to start dilaudid continuous infusion.  Skin:    General: Skin is warm and dry.  Neurological:     Mental Status: He is lethargic and disoriented.             Vital Signs: BP (!) 91/52   Pulse (!) 115   Temp 100.1 F (37.8 C) (Axillary)   Resp (!) 26   Ht 6' 3"  (1.905 m)   Wt 85.3 kg   SpO2 (!) 81%   BMI 23.50 kg/m  SpO2: SpO2: (!) 81 % O2 Device: O2 Device: Room Air O2 Flow Rate:  O2 Flow Rate (L/min): 2 L/min  Intake/output summary:   Intake/Output Summary (Last 24 hours) at 07/30/2019 1219 Last data filed at 07-30-2019 1100 Gross per 24 hour  Intake 37.55 ml  Output 300 ml  Net -262.45 ml   LBM: Last BM Date: July 30, 2019 Baseline Weight: Weight: 79.4 kg Most recent weight: Weight: 85.3 kg       Palliative Assessment/Data: PPS 10%      Patient Active Problem List   Diagnosis Date Noted  . Cardiogenic shock ([place])   . Anxiety state   . DNR (do not resuscitate)   . Acute mitral regurgitation   . Murmur   . Palliative care by specialist   . Terminal care   . AKI (acute kidney injury) ([place]) 07/02/2019  . Elevated CK 07/02/2019  . NSTEMI (non-ST elevated myocardial infarction) ([place]) 07/01/2019  . Sinus node dysfunction ([place]) 11/23/2018  . Pacemaker 11/23/2018  . Idiopathic progressive neuropathy 07/25/2018  . Vascular parkinsonism ([place]) 05/31/2018  . Bradycardia 09/16/2017  . CKD (chronic kidney disease), stage III 09/15/2017  . Near syncope 09/14/2017  . Post-operative pain   . Dementia without behavioral disturbance ([place])   . TIA (transient ischemic attack)   . Essential hypertension   . Bimalleolar fracture of left ankle 05/03/2017  . Axonal neuropathy 03/21/2017  . Right sided weakness 03/01/2017  . Cigarette smoker 06/16/2016  . SOB (shortness of breath) 06/15/2016  . Alzheimer disease ([place]) 01/07/2014  . CSA (central sleep apnea) 10/05/2013  . Syncope 06/26/2013  . COPD GOLD II 06/25/2013  . BPH (benign  prostatic hyperplasia) 05/18/2013  . Glaucoma 05/18/2013  . S/P right TH revision 05/14/2013  . Constipation 03/26/2013  . Osteoporosis 03/26/2013  . S/P right THA, AA 03/20/2013  . Hyperlipidemia   . Erectile dysfunction   . History of asbestos exposure   . Nocturia   . History of shingles   . Dermatitis, atopic   . OA (osteoarthritis) of hip   . Arthritis   . Frequency of urination   . Lumbar scoliosis   . Malignant neoplasm of prostate ([place]) 06/07/2011  . 86 year old gentleman with stage T3 adenocarcinoma prostate with Gleason score 4+4 and PSA of 10.3 05/11/2011    Palliative Care Assessment & Plan   Patient Profile: Per intake H&P --> Nashawn Hillock Donahueis a 86 y.o.malewith AAA, SSS s/p ppm placement, neurodegenerative disorder (dementia, gait disturbance, tremor, etc), orthostatic hypotension, CKD 3, TIA who was admitted 07/01/2019 with weakness and fatigue. Initial concerns for stroke but CT head negative. Labs and echo consistent with posterior MI. S/p cardiac cath 6/8 with successful PCI to 100% occluded vessel. Potential repeat cath this admission for other areas of occlusion on initial cath, significant RCA/LCS disease. Palliative care was asked to get involved to help address goals of care.   On 6/9, patient with dyspnea, soft blood pressures, and new murmur noted on cardiology exam. Stat CXR, ECHO, and lasix ordered. Stat ECHO revealed severe mitral vegetation with tethering of posterior mitral valve leaflet with pseudoprolapse of anterior mitral valve. Pending surgical evaluation. Per Dr. Audie Box, poor prognosis and high risk for mortality. Ongoing palliative discussions for goals of care.   Assessment: NSTEMI Significant RCA/LCS disease  Acute mitral regurgitation New onset murmur AKI on CKD stage IIIa Acute metabolic encephalopathy complicated by neurocognitive decline Peripheral neuropathy COPD/tobacco use ? Sjorgren's  Recommendations/Plan:  DNAR/DNI  Comfort  Measure  Transition home with [facility] hospice this morning   Goals of Care and Additional Recommendations:  Limitations  on Scope of Treatment: Full Comfort Care  Code Status:  DNR/DNI   Code Status Orders  (From admission, onward)         Start     Ordered   07/01/19 2320  Full code  Continuous     07/01/19 2319        Code Status History    Date Active Date Inactive Code Status Order ID Comments User Context   09/14/2017 1851 09/17/2017 2019 Full Code [id number]  Jani Gravel, MD ED   05/03/2017 1736 05/09/2017 1614 Full Code [id number]  Corky Sing, PA-C Inpatient   03/01/2017 2343 03/03/2017 2123 Full Code [id number]  Damita Lack, MD ED   05/14/2013 1737 05/17/2013 1636 Full Code [id number]  Pricilla Loveless, PA-C Inpatient   03/20/2013 1529 03/23/2013 1627 Full Code [id number]  Babish, Lucille Passy, PA-C Inpatient   Advance Care Planning Activity       Prognosis:  Likely hours   Discharge Planning:  Anticipated Hospital Death  Care plan was discussed with RN, f/u [facility] with multiple family members including wife and four children at bedside. Chaplain, Dr. Audie Box  Thank you for allowing the Palliative Medicine Team to assist in the care of this patient.   Time In: 0825 Time Out: 0900 Total Time 35 Prolonged Time Billed N/A      Greater than 50%  of this time was spent counseling and coordinating care related to the above assessment and plan.  Tacey Ruiz, DNP, gNP-C Palliative Medicine Team  Phone: [phone] Fax: [phone]  Please contact Palliative Medicine Team phone at [phone] for questions and concerns.

## 2019-07-26 NOTE — Progress Notes (Signed)
Hydrologist [facility]) Hospital Liaison: RN note    Notified by Transition of Care Manger of patient/family request for [facility] services at home after discharge. Chart and patient information under review by [facility] physician. Hospice eligibility approved by [facility] MD verbally by phone because this was a quick discharge.  Megan the Palliative NP  initiated education related to hospice philosophy, services and team approach to care. Family verbalized understanding of information given. Per discussion, plan is for discharge to home by PTAR.    Please send signed and completed DNR form home with patient/family. Patient will need prescriptions for discharge comfort medications. Per Jinny Blossom this has been done and ordered.   DME needs have been discussed, patient currently has the following equipment in the home: none.  Patient/family requests the following DME for delivery to the home: none. Per NP. Home address has been verified and is correct in the chart. [name] is primary contact.  [facility] aware of the above.  Please notify [facility] when patient is ready to leave the unit at discharge. (Call [phone] or [phone] after 5pm.) [facility] information and contact numbers given to [place].      Please call with any hospice related questions.     Thank you for this referral.     Clementeen Hoof, RN, [facility] (listed on [place] under Hospice and [facility] [facility] [place])  [phone]

## 2019-07-26 NOTE — Progress Notes (Signed)
RN spoke with case management regarding families desire for patient to go home with hospice today. Case manager working on coordinating hospice services and will follow-up with RN.    PA aware of families wishes and working on completing discharge summary and medications.    RN will call for PTAR when hospice arrangements are confirmed and prescriptions and discharge summary complete.

## 2019-07-26 NOTE — Progress Notes (Signed)
[facility] ([facility])  Pt is discharging today for EOL care at home.  Attempted to set up DME and dilaudid infusion, but family ultimately would like the pt home ASAP and opted to take him home without DME and use SL dilaudid.  [facility] will meet pt at the home to begin hospice services.  Venia Carbon RN, BSN, [facility] Liaison

## 2019-07-26 NOTE — Progress Notes (Signed)
PTAR present to pick up patient. Patient being discharged home with hospice. PICC line in place upon discharge. Hospice RN made aware that patient was in route.   All questions and concerns addressed with family. Pharmacy coordinating prescriptions so the patients daughter will be able to take them home shortly.

## 2019-07-26 DEATH — deceased

## 2019-10-31 ENCOUNTER — Ambulatory Visit: Payer: Medicare HMO | Admitting: Neurology

## 2019-11-30 ENCOUNTER — Ambulatory Visit: Payer: Medicare HMO | Admitting: Neurology
